# Patient Record
Sex: Female | Born: 1945 | Race: Black or African American | Hispanic: No | Smoking: Never smoker
Health system: Southern US, Community
[De-identification: ages and names within clinical notes are randomized; demographics above are authoritative.]

## PROBLEM LIST (undated history)

## (undated) DIAGNOSIS — Q249 Congenital malformation of heart, unspecified: Secondary | ICD-10-CM

## (undated) DIAGNOSIS — Z950 Presence of cardiac pacemaker: Secondary | ICD-10-CM

## (undated) DIAGNOSIS — M1990 Unspecified osteoarthritis, unspecified site: Secondary | ICD-10-CM

## (undated) DIAGNOSIS — I4811 Longstanding persistent atrial fibrillation: Secondary | ICD-10-CM

## (undated) DIAGNOSIS — I442 Atrioventricular block, complete: Secondary | ICD-10-CM

## (undated) DIAGNOSIS — Z0389 Encounter for observation for other suspected diseases and conditions ruled out: Secondary | ICD-10-CM

## (undated) DIAGNOSIS — R011 Cardiac murmur, unspecified: Secondary | ICD-10-CM

## (undated) DIAGNOSIS — I10 Essential (primary) hypertension: Secondary | ICD-10-CM

## (undated) DIAGNOSIS — E039 Hypothyroidism, unspecified: Secondary | ICD-10-CM

## (undated) DIAGNOSIS — I5032 Chronic diastolic (congestive) heart failure: Secondary | ICD-10-CM

## (undated) DIAGNOSIS — J449 Chronic obstructive pulmonary disease, unspecified: Secondary | ICD-10-CM

## (undated) DIAGNOSIS — J45909 Unspecified asthma, uncomplicated: Secondary | ICD-10-CM

## (undated) DIAGNOSIS — K219 Gastro-esophageal reflux disease without esophagitis: Secondary | ICD-10-CM

## (undated) DIAGNOSIS — IMO0001 Reserved for inherently not codable concepts without codable children: Secondary | ICD-10-CM

## (undated) DIAGNOSIS — I2510 Atherosclerotic heart disease of native coronary artery without angina pectoris: Secondary | ICD-10-CM

## (undated) HISTORY — PX: TUBAL LIGATION: SHX77

## (undated) HISTORY — DX: Atrioventricular block, complete: I44.2

## (undated) HISTORY — PX: OTHER SURGICAL HISTORY: SHX169

## (undated) HISTORY — PX: INSERT / REPLACE / REMOVE PACEMAKER: SUR710

## (undated) HISTORY — DX: Encounter for observation for other suspected diseases and conditions ruled out: Z03.89

---

## 1986-08-08 HISTORY — PX: PACEMAKER INSERTION: SHX728

## 1986-08-08 HISTORY — PX: OTHER SURGICAL HISTORY: SHX169

## 1997-11-24 ENCOUNTER — Ambulatory Visit (HOSPITAL_BASED_OUTPATIENT_CLINIC_OR_DEPARTMENT_OTHER): Admission: RE | Admit: 1997-11-24 | Discharge: 1997-11-24 | Payer: Self-pay | Admitting: Orthopedic Surgery

## 1997-12-24 ENCOUNTER — Encounter: Admission: RE | Admit: 1997-12-24 | Discharge: 1998-03-24 | Payer: Self-pay | Admitting: Orthopedic Surgery

## 1998-01-09 ENCOUNTER — Encounter: Admission: RE | Admit: 1998-01-09 | Discharge: 1998-01-09 | Payer: Self-pay | Admitting: Family Medicine

## 1998-01-21 ENCOUNTER — Encounter: Admission: RE | Admit: 1998-01-21 | Discharge: 1998-01-21 | Payer: Self-pay | Admitting: Family Medicine

## 1998-01-23 ENCOUNTER — Encounter: Admission: RE | Admit: 1998-01-23 | Discharge: 1998-01-23 | Payer: Self-pay | Admitting: Family Medicine

## 1998-01-28 ENCOUNTER — Encounter: Admission: RE | Admit: 1998-01-28 | Discharge: 1998-01-28 | Payer: Self-pay | Admitting: Family Medicine

## 1998-02-18 ENCOUNTER — Other Ambulatory Visit: Admission: RE | Admit: 1998-02-18 | Discharge: 1998-02-18 | Payer: Self-pay | Admitting: *Deleted

## 1998-02-18 ENCOUNTER — Encounter: Admission: RE | Admit: 1998-02-18 | Discharge: 1998-02-18 | Payer: Self-pay | Admitting: Family Medicine

## 1998-06-08 ENCOUNTER — Encounter: Admission: RE | Admit: 1998-06-08 | Discharge: 1998-06-08 | Payer: Self-pay | Admitting: Family Medicine

## 1998-06-09 ENCOUNTER — Encounter: Admission: RE | Admit: 1998-06-09 | Discharge: 1998-06-09 | Payer: Self-pay | Admitting: Sports Medicine

## 1998-08-18 ENCOUNTER — Encounter: Admission: RE | Admit: 1998-08-18 | Discharge: 1998-08-18 | Payer: Self-pay | Admitting: Sports Medicine

## 1998-09-08 ENCOUNTER — Encounter: Admission: RE | Admit: 1998-09-08 | Discharge: 1998-09-08 | Payer: Self-pay | Admitting: Family Medicine

## 1998-09-08 ENCOUNTER — Encounter: Admission: RE | Admit: 1998-09-08 | Discharge: 1998-09-28 | Payer: Self-pay

## 1998-12-01 ENCOUNTER — Encounter: Admission: RE | Admit: 1998-12-01 | Discharge: 1998-12-01 | Payer: Self-pay | Admitting: Sports Medicine

## 1998-12-16 ENCOUNTER — Encounter: Admission: RE | Admit: 1998-12-16 | Discharge: 1998-12-16 | Payer: Self-pay | Admitting: Family Medicine

## 1999-01-28 ENCOUNTER — Encounter: Admission: RE | Admit: 1999-01-28 | Discharge: 1999-01-28 | Payer: Self-pay | Admitting: Family Medicine

## 1999-02-11 ENCOUNTER — Encounter: Admission: RE | Admit: 1999-02-11 | Discharge: 1999-02-11 | Payer: Self-pay | Admitting: Family Medicine

## 1999-03-09 ENCOUNTER — Encounter: Admission: RE | Admit: 1999-03-09 | Discharge: 1999-03-09 | Payer: Self-pay | Admitting: Family Medicine

## 1999-03-31 ENCOUNTER — Encounter: Admission: RE | Admit: 1999-03-31 | Discharge: 1999-03-31 | Payer: Self-pay | Admitting: Family Medicine

## 1999-06-15 ENCOUNTER — Encounter: Payer: Self-pay | Admitting: Sports Medicine

## 1999-06-15 ENCOUNTER — Encounter: Admission: RE | Admit: 1999-06-15 | Discharge: 1999-06-15 | Payer: Self-pay | Admitting: Sports Medicine

## 1999-06-24 ENCOUNTER — Encounter: Admission: RE | Admit: 1999-06-24 | Discharge: 1999-06-24 | Payer: Self-pay | Admitting: Family Medicine

## 1999-07-02 ENCOUNTER — Encounter: Payer: Self-pay | Admitting: Sports Medicine

## 1999-07-15 ENCOUNTER — Encounter: Admission: RE | Admit: 1999-07-15 | Discharge: 1999-07-15 | Payer: Self-pay | Admitting: Family Medicine

## 1999-11-05 ENCOUNTER — Encounter: Admission: RE | Admit: 1999-11-05 | Discharge: 1999-11-05 | Payer: Self-pay | Admitting: Cardiology

## 1999-11-05 ENCOUNTER — Encounter: Payer: Self-pay | Admitting: Cardiology

## 1999-12-17 ENCOUNTER — Encounter: Admission: RE | Admit: 1999-12-17 | Discharge: 1999-12-17 | Payer: Self-pay | Admitting: Family Medicine

## 1999-12-28 ENCOUNTER — Encounter: Admission: RE | Admit: 1999-12-28 | Discharge: 1999-12-28 | Payer: Self-pay | Admitting: Sports Medicine

## 2000-02-02 ENCOUNTER — Encounter: Admission: RE | Admit: 2000-02-02 | Discharge: 2000-02-02 | Payer: Self-pay | Admitting: Family Medicine

## 2000-02-08 ENCOUNTER — Encounter: Payer: Self-pay | Admitting: Family Medicine

## 2000-02-08 ENCOUNTER — Encounter: Admission: RE | Admit: 2000-02-08 | Discharge: 2000-02-08 | Payer: Self-pay | Admitting: Family Medicine

## 2000-02-18 ENCOUNTER — Encounter: Admission: RE | Admit: 2000-02-18 | Discharge: 2000-02-18 | Payer: Self-pay | Admitting: Family Medicine

## 2000-03-01 ENCOUNTER — Ambulatory Visit (HOSPITAL_COMMUNITY): Admission: RE | Admit: 2000-03-01 | Discharge: 2000-03-01 | Payer: Self-pay | Admitting: Orthopedic Surgery

## 2000-03-01 ENCOUNTER — Encounter: Payer: Self-pay | Admitting: Orthopedic Surgery

## 2000-03-16 ENCOUNTER — Ambulatory Visit (HOSPITAL_COMMUNITY): Admission: RE | Admit: 2000-03-16 | Discharge: 2000-03-16 | Payer: Self-pay | Admitting: Family Medicine

## 2000-03-16 ENCOUNTER — Encounter: Admission: RE | Admit: 2000-03-16 | Discharge: 2000-03-16 | Payer: Self-pay | Admitting: Family Medicine

## 2000-04-26 ENCOUNTER — Encounter: Admission: RE | Admit: 2000-04-26 | Discharge: 2000-04-26 | Payer: Self-pay | Admitting: Family Medicine

## 2000-06-14 ENCOUNTER — Encounter: Admission: RE | Admit: 2000-06-14 | Discharge: 2000-06-14 | Payer: Self-pay | Admitting: Family Medicine

## 2000-08-22 ENCOUNTER — Encounter: Admission: RE | Admit: 2000-08-22 | Discharge: 2000-08-22 | Payer: Self-pay | Admitting: Sports Medicine

## 2000-08-25 ENCOUNTER — Encounter: Admission: RE | Admit: 2000-08-25 | Discharge: 2000-08-25 | Payer: Self-pay | Admitting: Family Medicine

## 2000-09-13 ENCOUNTER — Encounter: Admission: RE | Admit: 2000-09-13 | Discharge: 2000-09-13 | Payer: Self-pay | Admitting: Family Medicine

## 2000-09-27 ENCOUNTER — Encounter: Admission: RE | Admit: 2000-09-27 | Discharge: 2000-09-27 | Payer: Self-pay | Admitting: Family Medicine

## 2000-10-27 ENCOUNTER — Encounter: Admission: RE | Admit: 2000-10-27 | Discharge: 2000-10-27 | Payer: Self-pay | Admitting: Family Medicine

## 2000-11-16 ENCOUNTER — Encounter: Admission: RE | Admit: 2000-11-16 | Discharge: 2000-11-16 | Payer: Self-pay | Admitting: Family Medicine

## 2001-01-25 ENCOUNTER — Encounter: Admission: RE | Admit: 2001-01-25 | Discharge: 2001-01-25 | Payer: Self-pay | Admitting: Family Medicine

## 2001-04-27 ENCOUNTER — Encounter: Admission: RE | Admit: 2001-04-27 | Discharge: 2001-04-27 | Payer: Self-pay | Admitting: Family Medicine

## 2001-05-08 ENCOUNTER — Encounter: Admission: RE | Admit: 2001-05-08 | Discharge: 2001-05-08 | Payer: Self-pay | Admitting: Family Medicine

## 2001-06-05 ENCOUNTER — Encounter: Admission: RE | Admit: 2001-06-05 | Discharge: 2001-06-05 | Payer: Self-pay | Admitting: Family Medicine

## 2001-06-06 ENCOUNTER — Encounter: Admission: RE | Admit: 2001-06-06 | Discharge: 2001-06-06 | Payer: Self-pay | Admitting: Sports Medicine

## 2001-06-06 ENCOUNTER — Encounter: Payer: Self-pay | Admitting: Sports Medicine

## 2001-09-10 ENCOUNTER — Encounter: Admission: RE | Admit: 2001-09-10 | Discharge: 2001-09-10 | Payer: Self-pay | Admitting: Family Medicine

## 2001-10-01 ENCOUNTER — Encounter: Admission: RE | Admit: 2001-10-01 | Discharge: 2001-10-01 | Payer: Self-pay | Admitting: Family Medicine

## 2001-10-05 ENCOUNTER — Encounter: Admission: RE | Admit: 2001-10-05 | Discharge: 2001-10-05 | Payer: Self-pay | Admitting: Sports Medicine

## 2001-10-05 ENCOUNTER — Encounter: Payer: Self-pay | Admitting: Sports Medicine

## 2001-10-17 ENCOUNTER — Encounter: Admission: RE | Admit: 2001-10-17 | Discharge: 2001-10-17 | Payer: Self-pay | Admitting: Family Medicine

## 2001-11-01 ENCOUNTER — Encounter: Admission: RE | Admit: 2001-11-01 | Discharge: 2002-01-30 | Payer: Self-pay | Admitting: Sports Medicine

## 2001-12-03 ENCOUNTER — Encounter: Admission: RE | Admit: 2001-12-03 | Discharge: 2001-12-03 | Payer: Self-pay | Admitting: Family Medicine

## 2001-12-17 ENCOUNTER — Encounter: Admission: RE | Admit: 2001-12-17 | Discharge: 2001-12-17 | Payer: Self-pay | Admitting: Family Medicine

## 2002-01-08 ENCOUNTER — Encounter: Admission: RE | Admit: 2002-01-08 | Discharge: 2002-01-08 | Payer: Self-pay | Admitting: Family Medicine

## 2002-01-29 ENCOUNTER — Encounter: Admission: RE | Admit: 2002-01-29 | Discharge: 2002-01-29 | Payer: Self-pay | Admitting: Family Medicine

## 2002-03-12 ENCOUNTER — Encounter: Admission: RE | Admit: 2002-03-12 | Discharge: 2002-03-12 | Payer: Self-pay | Admitting: Sports Medicine

## 2002-03-27 ENCOUNTER — Ambulatory Visit (HOSPITAL_COMMUNITY): Admission: RE | Admit: 2002-03-27 | Discharge: 2002-03-27 | Payer: Self-pay | Admitting: Cardiology

## 2002-03-27 ENCOUNTER — Encounter: Payer: Self-pay | Admitting: Cardiology

## 2002-06-03 ENCOUNTER — Encounter: Admission: RE | Admit: 2002-06-03 | Discharge: 2002-06-03 | Payer: Self-pay | Admitting: Family Medicine

## 2002-07-19 ENCOUNTER — Encounter: Admission: RE | Admit: 2002-07-19 | Discharge: 2002-07-19 | Payer: Self-pay | Admitting: Family Medicine

## 2002-08-20 ENCOUNTER — Encounter: Admission: RE | Admit: 2002-08-20 | Discharge: 2002-08-20 | Payer: Self-pay | Admitting: Sports Medicine

## 2002-08-30 ENCOUNTER — Encounter: Admission: RE | Admit: 2002-08-30 | Discharge: 2002-08-30 | Payer: Self-pay | Admitting: Family Medicine

## 2002-09-30 ENCOUNTER — Encounter: Admission: RE | Admit: 2002-09-30 | Discharge: 2002-09-30 | Payer: Self-pay | Admitting: Family Medicine

## 2002-10-01 ENCOUNTER — Encounter: Admission: RE | Admit: 2002-10-01 | Discharge: 2002-10-01 | Payer: Self-pay | Admitting: Family Medicine

## 2002-10-10 ENCOUNTER — Encounter: Admission: RE | Admit: 2002-10-10 | Discharge: 2002-10-10 | Payer: Self-pay | Admitting: Family Medicine

## 2002-10-31 ENCOUNTER — Encounter: Admission: RE | Admit: 2002-10-31 | Discharge: 2002-10-31 | Payer: Self-pay | Admitting: Family Medicine

## 2003-02-17 ENCOUNTER — Encounter: Admission: RE | Admit: 2003-02-17 | Discharge: 2003-02-17 | Payer: Self-pay | Admitting: Family Medicine

## 2003-02-25 ENCOUNTER — Encounter: Payer: Self-pay | Admitting: Sports Medicine

## 2003-02-25 ENCOUNTER — Encounter: Admission: RE | Admit: 2003-02-25 | Discharge: 2003-02-25 | Payer: Self-pay | Admitting: Sports Medicine

## 2003-03-11 ENCOUNTER — Encounter: Admission: RE | Admit: 2003-03-11 | Discharge: 2003-06-09 | Payer: Self-pay | Admitting: Family Medicine

## 2003-06-20 ENCOUNTER — Encounter: Admission: RE | Admit: 2003-06-20 | Discharge: 2003-06-20 | Payer: Self-pay | Admitting: Family Medicine

## 2003-06-23 ENCOUNTER — Encounter: Admission: RE | Admit: 2003-06-23 | Discharge: 2003-06-23 | Payer: Self-pay | Admitting: Family Medicine

## 2003-12-24 ENCOUNTER — Encounter: Admission: RE | Admit: 2003-12-24 | Discharge: 2003-12-24 | Payer: Self-pay | Admitting: Sports Medicine

## 2003-12-24 ENCOUNTER — Other Ambulatory Visit: Admission: RE | Admit: 2003-12-24 | Discharge: 2003-12-24 | Payer: Self-pay | Admitting: Family Medicine

## 2004-06-15 ENCOUNTER — Ambulatory Visit: Payer: Self-pay | Admitting: Family Medicine

## 2004-09-21 ENCOUNTER — Ambulatory Visit: Payer: Self-pay | Admitting: Family Medicine

## 2004-12-11 ENCOUNTER — Emergency Department (HOSPITAL_COMMUNITY): Admission: EM | Admit: 2004-12-11 | Discharge: 2004-12-11 | Payer: Self-pay | Admitting: Family Medicine

## 2005-01-21 ENCOUNTER — Ambulatory Visit: Payer: Self-pay | Admitting: Family Medicine

## 2005-02-27 ENCOUNTER — Emergency Department (HOSPITAL_COMMUNITY): Admission: EM | Admit: 2005-02-27 | Discharge: 2005-02-27 | Payer: Self-pay | Admitting: Family Medicine

## 2005-03-18 ENCOUNTER — Emergency Department (HOSPITAL_COMMUNITY): Admission: EM | Admit: 2005-03-18 | Discharge: 2005-03-18 | Payer: Self-pay | Admitting: Family Medicine

## 2005-08-09 ENCOUNTER — Ambulatory Visit: Payer: Self-pay | Admitting: Sports Medicine

## 2005-08-14 ENCOUNTER — Encounter (INDEPENDENT_AMBULATORY_CARE_PROVIDER_SITE_OTHER): Payer: Self-pay | Admitting: *Deleted

## 2005-08-24 ENCOUNTER — Ambulatory Visit (HOSPITAL_COMMUNITY): Admission: RE | Admit: 2005-08-24 | Discharge: 2005-08-24 | Payer: Self-pay | Admitting: Family Medicine

## 2005-08-29 ENCOUNTER — Encounter: Admission: RE | Admit: 2005-08-29 | Discharge: 2005-08-29 | Payer: Self-pay | Admitting: Sports Medicine

## 2005-09-08 ENCOUNTER — Ambulatory Visit (HOSPITAL_COMMUNITY): Admission: RE | Admit: 2005-09-08 | Discharge: 2005-09-08 | Payer: Self-pay | Admitting: Gastroenterology

## 2005-09-08 ENCOUNTER — Encounter (INDEPENDENT_AMBULATORY_CARE_PROVIDER_SITE_OTHER): Payer: Self-pay | Admitting: *Deleted

## 2005-10-31 ENCOUNTER — Ambulatory Visit: Payer: Self-pay | Admitting: Family Medicine

## 2005-11-04 ENCOUNTER — Ambulatory Visit: Payer: Self-pay | Admitting: Sports Medicine

## 2005-12-02 ENCOUNTER — Ambulatory Visit: Payer: Self-pay | Admitting: Family Medicine

## 2006-01-03 ENCOUNTER — Ambulatory Visit: Payer: Self-pay | Admitting: Family Medicine

## 2006-02-22 ENCOUNTER — Ambulatory Visit: Payer: Self-pay | Admitting: Family Medicine

## 2006-02-22 ENCOUNTER — Encounter: Admission: RE | Admit: 2006-02-22 | Discharge: 2006-02-22 | Payer: Self-pay | Admitting: Sports Medicine

## 2006-05-29 ENCOUNTER — Ambulatory Visit: Payer: Self-pay | Admitting: Sports Medicine

## 2006-06-21 ENCOUNTER — Emergency Department (HOSPITAL_COMMUNITY): Admission: EM | Admit: 2006-06-21 | Discharge: 2006-06-21 | Payer: Self-pay | Admitting: Emergency Medicine

## 2006-08-10 ENCOUNTER — Encounter (INDEPENDENT_AMBULATORY_CARE_PROVIDER_SITE_OTHER): Payer: Self-pay | Admitting: Family Medicine

## 2006-08-10 ENCOUNTER — Ambulatory Visit: Payer: Self-pay | Admitting: Family Medicine

## 2006-08-10 LAB — CONVERTED CEMR LAB
Calcium: 10.5 mg/dL (ref 8.4–10.5)
Chloride: 101 meq/L (ref 96–112)
Glucose, Bld: 113 mg/dL — ABNORMAL HIGH (ref 70–99)
Potassium: 3.9 meq/L (ref 3.5–5.3)

## 2006-08-30 ENCOUNTER — Ambulatory Visit: Payer: Self-pay | Admitting: Family Medicine

## 2006-09-23 ENCOUNTER — Emergency Department (HOSPITAL_COMMUNITY): Admission: EM | Admit: 2006-09-23 | Discharge: 2006-09-23 | Payer: Self-pay | Admitting: Family Medicine

## 2006-10-06 ENCOUNTER — Encounter (INDEPENDENT_AMBULATORY_CARE_PROVIDER_SITE_OTHER): Payer: Self-pay | Admitting: *Deleted

## 2006-10-10 ENCOUNTER — Encounter: Admission: RE | Admit: 2006-10-10 | Discharge: 2006-10-10 | Payer: Self-pay | Admitting: Family Medicine

## 2006-10-18 ENCOUNTER — Encounter: Admission: RE | Admit: 2006-10-18 | Discharge: 2006-10-18 | Payer: Self-pay | Admitting: Sports Medicine

## 2006-10-23 ENCOUNTER — Telehealth: Payer: Self-pay | Admitting: *Deleted

## 2006-11-03 ENCOUNTER — Ambulatory Visit: Payer: Self-pay | Admitting: Family Medicine

## 2006-11-03 ENCOUNTER — Encounter (INDEPENDENT_AMBULATORY_CARE_PROVIDER_SITE_OTHER): Payer: Self-pay | Admitting: Family Medicine

## 2006-11-03 DIAGNOSIS — E785 Hyperlipidemia, unspecified: Secondary | ICD-10-CM | POA: Insufficient documentation

## 2006-11-03 LAB — CONVERTED CEMR LAB
Cholesterol: 158 mg/dL (ref 0–200)
HDL: 43 mg/dL (ref >39)
Total CHOL/HDL Ratio: 3.7
Triglycerides: 123 mg/dL (ref <150)
VLDL: 25 mg/dL (ref 0–40)

## 2006-11-06 ENCOUNTER — Encounter (INDEPENDENT_AMBULATORY_CARE_PROVIDER_SITE_OTHER): Payer: Self-pay | Admitting: Family Medicine

## 2006-11-06 ENCOUNTER — Ambulatory Visit: Payer: Self-pay | Admitting: Sports Medicine

## 2006-11-06 DIAGNOSIS — F339 Major depressive disorder, recurrent, unspecified: Secondary | ICD-10-CM | POA: Insufficient documentation

## 2006-11-06 DIAGNOSIS — F329 Major depressive disorder, single episode, unspecified: Secondary | ICD-10-CM

## 2006-11-06 DIAGNOSIS — E119 Type 2 diabetes mellitus without complications: Secondary | ICD-10-CM

## 2006-11-06 LAB — CONVERTED CEMR LAB
BUN: 12 mg/dL (ref 6–23)
CO2: 30 meq/L (ref 19–32)
Calcium: 10.3 mg/dL (ref 8.4–10.5)
Cholesterol, target level: 200 mg/dL
Creatinine, Ser: 0.79 mg/dL (ref 0.40–1.20)
Glucose, Bld: 76 mg/dL (ref 70–99)
Hgb A1c MFr Bld: 7 %
Sodium: 143 meq/L (ref 135–145)

## 2006-11-09 ENCOUNTER — Encounter (INDEPENDENT_AMBULATORY_CARE_PROVIDER_SITE_OTHER): Payer: Self-pay | Admitting: Family Medicine

## 2006-11-13 ENCOUNTER — Telehealth: Payer: Self-pay | Admitting: *Deleted

## 2006-12-27 ENCOUNTER — Telehealth: Payer: Self-pay | Admitting: *Deleted

## 2007-01-26 ENCOUNTER — Telehealth: Payer: Self-pay | Admitting: *Deleted

## 2007-02-19 ENCOUNTER — Encounter (INDEPENDENT_AMBULATORY_CARE_PROVIDER_SITE_OTHER): Payer: Self-pay | Admitting: Family Medicine

## 2007-02-19 ENCOUNTER — Ambulatory Visit: Payer: Self-pay | Admitting: Family Medicine

## 2007-02-19 LAB — CONVERTED CEMR LAB
ALT: 18 units/L (ref 0–35)
CO2: 27 meq/L (ref 19–32)
Calcium: 9.7 mg/dL (ref 8.4–10.5)
Chloride: 101 meq/L (ref 96–112)
Creatinine, Ser: 0.81 mg/dL (ref 0.40–1.20)
Glucose, Bld: 65 mg/dL — ABNORMAL LOW (ref 70–99)
Magnesium: 1.8 mg/dL (ref 1.5–2.5)
Phosphorus: 3.5 mg/dL (ref 2.3–4.6)
Total Bilirubin: 0.4 mg/dL (ref 0.3–1.2)
Total Protein: 7.8 g/dL (ref 6.0–8.3)

## 2007-02-20 ENCOUNTER — Encounter (INDEPENDENT_AMBULATORY_CARE_PROVIDER_SITE_OTHER): Payer: Self-pay | Admitting: Family Medicine

## 2007-02-21 ENCOUNTER — Encounter (INDEPENDENT_AMBULATORY_CARE_PROVIDER_SITE_OTHER): Payer: Self-pay | Admitting: Family Medicine

## 2007-03-05 ENCOUNTER — Encounter (INDEPENDENT_AMBULATORY_CARE_PROVIDER_SITE_OTHER): Payer: Self-pay | Admitting: Family Medicine

## 2007-03-05 ENCOUNTER — Ambulatory Visit: Payer: Self-pay | Admitting: Family Medicine

## 2007-03-05 LAB — CONVERTED CEMR LAB: Total CK: 154 units/L (ref 7–177)

## 2007-03-13 ENCOUNTER — Encounter: Admission: RE | Admit: 2007-03-13 | Discharge: 2007-03-13 | Payer: Self-pay | Admitting: Family Medicine

## 2007-03-21 ENCOUNTER — Telehealth: Payer: Self-pay | Admitting: *Deleted

## 2007-04-04 ENCOUNTER — Ambulatory Visit: Payer: Self-pay | Admitting: Family Medicine

## 2007-05-05 ENCOUNTER — Emergency Department (HOSPITAL_COMMUNITY): Admission: EM | Admit: 2007-05-05 | Discharge: 2007-05-05 | Payer: Self-pay | Admitting: Emergency Medicine

## 2007-06-01 ENCOUNTER — Encounter (INDEPENDENT_AMBULATORY_CARE_PROVIDER_SITE_OTHER): Payer: Self-pay | Admitting: Family Medicine

## 2007-06-01 ENCOUNTER — Ambulatory Visit: Payer: Self-pay | Admitting: Family Medicine

## 2007-06-01 LAB — CONVERTED CEMR LAB
CO2: 30 meq/L (ref 19–32)
Calcium: 9.9 mg/dL (ref 8.4–10.5)
Chloride: 101 meq/L (ref 96–112)
Glucose, Bld: 98 mg/dL (ref 70–99)
Hemoglobin: 13 g/dL (ref 12.0–15.0)
MCV: 87.3 fL (ref 78.0–100.0)
Potassium: 3.7 meq/L (ref 3.5–5.3)
Sodium: 143 meq/L (ref 135–145)

## 2007-06-08 ENCOUNTER — Telehealth: Payer: Self-pay | Admitting: *Deleted

## 2007-06-14 ENCOUNTER — Encounter (INDEPENDENT_AMBULATORY_CARE_PROVIDER_SITE_OTHER): Payer: Self-pay | Admitting: Family Medicine

## 2007-06-14 ENCOUNTER — Ambulatory Visit: Payer: Self-pay | Admitting: Family Medicine

## 2007-06-14 DIAGNOSIS — I10 Essential (primary) hypertension: Secondary | ICD-10-CM

## 2007-06-18 ENCOUNTER — Encounter: Admission: RE | Admit: 2007-06-18 | Discharge: 2007-06-18 | Payer: Self-pay | Admitting: Cardiology

## 2007-06-22 ENCOUNTER — Inpatient Hospital Stay (HOSPITAL_COMMUNITY): Admission: RE | Admit: 2007-06-22 | Discharge: 2007-06-23 | Payer: Self-pay | Admitting: *Deleted

## 2007-07-13 ENCOUNTER — Telehealth: Payer: Self-pay | Admitting: *Deleted

## 2007-07-16 ENCOUNTER — Telehealth (INDEPENDENT_AMBULATORY_CARE_PROVIDER_SITE_OTHER): Payer: Self-pay | Admitting: Family Medicine

## 2007-07-27 ENCOUNTER — Encounter (INDEPENDENT_AMBULATORY_CARE_PROVIDER_SITE_OTHER): Payer: Self-pay | Admitting: Family Medicine

## 2007-09-03 ENCOUNTER — Encounter (INDEPENDENT_AMBULATORY_CARE_PROVIDER_SITE_OTHER): Payer: Self-pay | Admitting: Family Medicine

## 2007-09-19 ENCOUNTER — Ambulatory Visit: Payer: Self-pay | Admitting: Family Medicine

## 2007-10-15 ENCOUNTER — Telehealth: Payer: Self-pay | Admitting: *Deleted

## 2007-10-23 ENCOUNTER — Ambulatory Visit: Payer: Self-pay | Admitting: Family Medicine

## 2007-11-01 ENCOUNTER — Encounter: Admission: RE | Admit: 2007-11-01 | Discharge: 2007-11-01 | Payer: Self-pay | Admitting: Family Medicine

## 2007-11-11 ENCOUNTER — Encounter: Payer: Self-pay | Admitting: *Deleted

## 2007-11-12 ENCOUNTER — Telehealth: Payer: Self-pay | Admitting: *Deleted

## 2007-11-26 ENCOUNTER — Ambulatory Visit: Payer: Self-pay | Admitting: Family Medicine

## 2007-11-26 ENCOUNTER — Encounter (INDEPENDENT_AMBULATORY_CARE_PROVIDER_SITE_OTHER): Payer: Self-pay | Admitting: Family Medicine

## 2007-11-26 ENCOUNTER — Telehealth: Payer: Self-pay | Admitting: *Deleted

## 2007-11-26 LAB — CONVERTED CEMR LAB
HCT: 38.7 % (ref 36.0–46.0)
Hemoglobin: 12.4 g/dL (ref 12.0–15.0)
MCHC: 32 g/dL (ref 30.0–36.0)
MCV: 86.6 fL (ref 78.0–100.0)
Pap Smear: NORMAL
Platelets: 251 10*3/uL (ref 150–400)
RBC: 4.47 M/uL (ref 3.87–5.11)
RDW: 13.3 % (ref 11.5–15.5)
WBC: 7.9 10*3/uL (ref 4.0–10.5)

## 2007-12-03 ENCOUNTER — Encounter (INDEPENDENT_AMBULATORY_CARE_PROVIDER_SITE_OTHER): Payer: Self-pay | Admitting: Family Medicine

## 2007-12-04 ENCOUNTER — Telehealth: Payer: Self-pay | Admitting: *Deleted

## 2008-01-30 ENCOUNTER — Encounter (INDEPENDENT_AMBULATORY_CARE_PROVIDER_SITE_OTHER): Payer: Self-pay | Admitting: Family Medicine

## 2008-02-18 ENCOUNTER — Telehealth: Payer: Self-pay | Admitting: *Deleted

## 2008-02-19 ENCOUNTER — Encounter: Payer: Self-pay | Admitting: Family Medicine

## 2008-04-22 ENCOUNTER — Telehealth: Payer: Self-pay | Admitting: *Deleted

## 2008-04-22 ENCOUNTER — Encounter: Payer: Self-pay | Admitting: Family Medicine

## 2008-05-19 ENCOUNTER — Encounter: Admission: RE | Admit: 2008-05-19 | Discharge: 2008-05-19 | Payer: Self-pay | Admitting: Orthopedic Surgery

## 2008-05-28 ENCOUNTER — Ambulatory Visit: Payer: Self-pay | Admitting: Family Medicine

## 2008-06-27 ENCOUNTER — Encounter: Payer: Self-pay | Admitting: Family Medicine

## 2008-08-05 ENCOUNTER — Encounter: Payer: Self-pay | Admitting: Family Medicine

## 2008-08-28 ENCOUNTER — Encounter: Payer: Self-pay | Admitting: Family Medicine

## 2008-09-01 ENCOUNTER — Telehealth: Payer: Self-pay | Admitting: *Deleted

## 2008-09-03 ENCOUNTER — Ambulatory Visit: Payer: Self-pay | Admitting: Family Medicine

## 2008-09-03 ENCOUNTER — Encounter: Payer: Self-pay | Admitting: Family Medicine

## 2008-09-03 DIAGNOSIS — R259 Unspecified abnormal involuntary movements: Secondary | ICD-10-CM

## 2008-09-03 HISTORY — DX: Unspecified abnormal involuntary movements: R25.9

## 2008-09-03 LAB — CONVERTED CEMR LAB: Hgb A1c MFr Bld: 7.7 %

## 2008-09-04 ENCOUNTER — Encounter: Payer: Self-pay | Admitting: Family Medicine

## 2008-09-04 LAB — CONVERTED CEMR LAB
AST: 17 units/L (ref 0–37)
Albumin: 4.5 g/dL (ref 3.5–5.2)
Alkaline Phosphatase: 49 units/L (ref 39–117)
BUN: 17 mg/dL (ref 6–23)
Calcium: 9.9 mg/dL (ref 8.4–10.5)
Chloride: 99 meq/L (ref 96–112)
HDL: 39 mg/dL — ABNORMAL LOW (ref >39)
Hemoglobin: 13.2 g/dL (ref 12.0–15.0)
LDL Cholesterol: 98 mg/dL (ref 0–99)
MCHC: 32 g/dL (ref 30.0–36.0)
Potassium: 4.3 meq/L (ref 3.5–5.3)
RBC: 4.71 M/uL (ref 3.87–5.11)
RDW: 12.4 % (ref 11.5–15.5)
Sodium: 141 meq/L (ref 135–145)
TSH: 3.823 microintl units/mL (ref 0.350–4.50)
Total Protein: 7.7 g/dL (ref 6.0–8.3)

## 2008-09-23 ENCOUNTER — Encounter: Payer: Self-pay | Admitting: Family Medicine

## 2008-09-30 ENCOUNTER — Ambulatory Visit (HOSPITAL_COMMUNITY): Admission: RE | Admit: 2008-09-30 | Discharge: 2008-10-01 | Payer: Self-pay | Admitting: Orthopedic Surgery

## 2008-10-20 ENCOUNTER — Encounter: Admission: RE | Admit: 2008-10-20 | Discharge: 2008-12-24 | Payer: Self-pay | Admitting: Orthopedic Surgery

## 2008-10-23 ENCOUNTER — Telehealth: Payer: Self-pay | Admitting: *Deleted

## 2008-11-06 ENCOUNTER — Encounter: Payer: Self-pay | Admitting: Family Medicine

## 2008-11-06 DIAGNOSIS — J449 Chronic obstructive pulmonary disease, unspecified: Secondary | ICD-10-CM | POA: Insufficient documentation

## 2009-01-19 ENCOUNTER — Ambulatory Visit: Payer: Self-pay | Admitting: Family Medicine

## 2009-01-19 DIAGNOSIS — K219 Gastro-esophageal reflux disease without esophagitis: Secondary | ICD-10-CM

## 2009-01-20 ENCOUNTER — Encounter: Payer: Self-pay | Admitting: Family Medicine

## 2009-01-21 ENCOUNTER — Encounter: Payer: Self-pay | Admitting: Family Medicine

## 2009-01-30 ENCOUNTER — Telehealth: Payer: Self-pay | Admitting: *Deleted

## 2009-02-10 ENCOUNTER — Encounter: Payer: Self-pay | Admitting: Family Medicine

## 2009-02-24 ENCOUNTER — Encounter: Admission: RE | Admit: 2009-02-24 | Discharge: 2009-02-24 | Payer: Self-pay | Admitting: Orthopedic Surgery

## 2009-03-26 ENCOUNTER — Encounter: Admission: RE | Admit: 2009-03-26 | Discharge: 2009-03-26 | Payer: Self-pay | Admitting: Orthopedic Surgery

## 2009-05-05 ENCOUNTER — Encounter: Admission: RE | Admit: 2009-05-05 | Discharge: 2009-05-05 | Payer: Self-pay | Admitting: Orthopedic Surgery

## 2009-07-07 ENCOUNTER — Ambulatory Visit: Payer: Self-pay | Admitting: Family Medicine

## 2009-08-08 DIAGNOSIS — IMO0001 Reserved for inherently not codable concepts without codable children: Secondary | ICD-10-CM

## 2009-08-08 HISTORY — DX: Reserved for inherently not codable concepts without codable children: IMO0001

## 2009-08-08 HISTORY — PX: CARDIAC CATHETERIZATION: SHX172

## 2009-09-04 ENCOUNTER — Encounter: Admission: RE | Admit: 2009-09-04 | Discharge: 2009-09-04 | Payer: Self-pay | Admitting: Orthopedic Surgery

## 2009-09-18 ENCOUNTER — Encounter: Admission: RE | Admit: 2009-09-18 | Discharge: 2009-09-18 | Payer: Self-pay | Admitting: Cardiology

## 2009-09-18 ENCOUNTER — Encounter: Payer: Self-pay | Admitting: Family Medicine

## 2009-09-22 ENCOUNTER — Ambulatory Visit (HOSPITAL_COMMUNITY): Admission: RE | Admit: 2009-09-22 | Discharge: 2009-09-22 | Payer: Self-pay | Admitting: Cardiology

## 2009-09-24 ENCOUNTER — Encounter: Payer: Self-pay | Admitting: Family Medicine

## 2009-11-17 ENCOUNTER — Encounter: Payer: Self-pay | Admitting: Family Medicine

## 2009-11-20 ENCOUNTER — Encounter: Payer: Self-pay | Admitting: Family Medicine

## 2009-12-25 ENCOUNTER — Ambulatory Visit: Payer: Self-pay | Admitting: Family Medicine

## 2009-12-25 DIAGNOSIS — H401132 Primary open-angle glaucoma, bilateral, moderate stage: Secondary | ICD-10-CM | POA: Insufficient documentation

## 2009-12-25 DIAGNOSIS — H409 Unspecified glaucoma: Secondary | ICD-10-CM | POA: Insufficient documentation

## 2009-12-25 LAB — CONVERTED CEMR LAB: Hgb A1c MFr Bld: 9.8 %

## 2009-12-28 ENCOUNTER — Ambulatory Visit: Payer: Self-pay | Admitting: Family Medicine

## 2009-12-28 ENCOUNTER — Encounter: Payer: Self-pay | Admitting: Family Medicine

## 2009-12-28 DIAGNOSIS — E039 Hypothyroidism, unspecified: Secondary | ICD-10-CM | POA: Insufficient documentation

## 2009-12-29 ENCOUNTER — Encounter: Payer: Self-pay | Admitting: Family Medicine

## 2009-12-30 LAB — CONVERTED CEMR LAB: Free T4: 1.09 ng/dL (ref 0.80–1.80)

## 2010-01-05 ENCOUNTER — Ambulatory Visit: Payer: Self-pay | Admitting: Family Medicine

## 2010-01-05 DIAGNOSIS — M76899 Other specified enthesopathies of unspecified lower limb, excluding foot: Secondary | ICD-10-CM

## 2010-01-05 LAB — CONVERTED CEMR LAB
ALT: 22 units/L (ref 0–35)
Albumin: 4.1 g/dL (ref 3.5–5.2)
CO2: 28 meq/L (ref 19–32)
Chloride: 101 meq/L (ref 96–112)
Cholesterol: 201 mg/dL — ABNORMAL HIGH (ref 0–200)
Glucose, Bld: 218 mg/dL — ABNORMAL HIGH (ref 70–99)
Potassium: 4 meq/L (ref 3.5–5.3)
RBC: 5.02 M/uL (ref 3.87–5.11)
Sodium: 141 meq/L (ref 135–145)
Total Protein: 6.9 g/dL (ref 6.0–8.3)
Triglycerides: 218 mg/dL — ABNORMAL HIGH (ref <150)
WBC: 4.3 10*3/uL (ref 4.0–10.5)

## 2010-02-03 ENCOUNTER — Encounter: Payer: Self-pay | Admitting: Family Medicine

## 2010-03-05 ENCOUNTER — Telehealth: Payer: Self-pay | Admitting: *Deleted

## 2010-03-31 ENCOUNTER — Encounter: Payer: Self-pay | Admitting: Family Medicine

## 2010-04-01 ENCOUNTER — Encounter: Payer: Self-pay | Admitting: Family Medicine

## 2010-04-19 ENCOUNTER — Encounter: Payer: Self-pay | Admitting: Family Medicine

## 2010-06-05 ENCOUNTER — Emergency Department (HOSPITAL_COMMUNITY): Admission: EM | Admit: 2010-06-05 | Discharge: 2010-06-05 | Payer: Self-pay | Admitting: Family Medicine

## 2010-07-15 ENCOUNTER — Encounter: Payer: Self-pay | Admitting: Family Medicine

## 2010-07-15 ENCOUNTER — Telehealth: Payer: Self-pay | Admitting: Family Medicine

## 2010-07-15 ENCOUNTER — Encounter
Admission: RE | Admit: 2010-07-15 | Discharge: 2010-07-15 | Payer: Self-pay | Source: Home / Self Care | Admitting: Family Medicine

## 2010-07-15 ENCOUNTER — Ambulatory Visit: Payer: Self-pay | Admitting: Family Medicine

## 2010-07-15 DIAGNOSIS — M25539 Pain in unspecified wrist: Secondary | ICD-10-CM | POA: Insufficient documentation

## 2010-07-15 LAB — CONVERTED CEMR LAB
ALT: 22 units/L (ref 0–35)
AST: 19 units/L (ref 0–37)
CO2: 31 meq/L (ref 19–32)
Chloride: 100 meq/L (ref 96–112)
Hgb A1c MFr Bld: 8 %
Sodium: 142 meq/L (ref 135–145)
TSH: 4.976 microintl units/mL — ABNORMAL HIGH (ref 0.350–4.500)
Total Bilirubin: 0.4 mg/dL (ref 0.3–1.2)
Total Protein: 7.1 g/dL (ref 6.0–8.3)

## 2010-07-21 ENCOUNTER — Telehealth: Payer: Self-pay | Admitting: Family Medicine

## 2010-08-25 ENCOUNTER — Encounter
Admission: RE | Admit: 2010-08-25 | Discharge: 2010-08-25 | Payer: Self-pay | Source: Home / Self Care | Attending: Orthopedic Surgery | Admitting: Orthopedic Surgery

## 2010-09-07 NOTE — Assessment & Plan Note (Signed)
Summary: meet new MD/eo   Vital Signs:  Patient profile:   65 year old female Height:      62.75 inches Weight:      155.6 pounds BMI:     27.88 Pulse rate:   72 / minute BP sitting:   128 / 80  (right arm)  Vitals Entered By: Arlyss Repress CMA, (July 15, 2010 8:36 AM) CC: meet new doctor. 1.) f/up DM and HTN 2.) flu shot 3.) check left hand. c/o pain with lifting things Is Patient Diabetic? Yes Pain Assessment Patient in pain? no        Primary Care Provider:  Bobby Rumpf  MD  CC:  meet new doctor. 1.) f/up DM and HTN 2.) flu shot 3.) check left hand. c/o pain with lifting things.  History of Present Illness: 1) DM2: A1C 9.13 Dec 2009. Has had better control in the past. Reports that she is trying to do better with following her diet. Walks for exercise every day. On medications without side effects or hypoglycemic symptoms. Reports CBGs fasting range from 98 to 165 most days unless she strays too far from her dietary recommendations  2) HTN: BP at goal. Taking medications as below without side effects. Reports that she is exercising as above. Denies chest pain, LE edema.   3) Left wrist pain: x 6 months. Tender to palpation at entire wrist especially dorsal. Denies numbness / tingling but reports inability to lift heavy items in her left hand for extended periods secondary to pain. Reports morning wrist and finger stiffness in her left hand. Patient is right handed. Denies joint swelling, redness, preceding trauma.   4) Prevention: Due for mammogram. Due for flu shot.   Habits & Providers  Alcohol-Tobacco-Diet     Tobacco Status: never  Current Medications (verified): 1)  Albuterol 90 Mcg/act Aers (Albuterol) .... Inhale 2 Puff Using Inhaler Every Four Hours 2)  Flovent Hfa 110 Mcg/act Aero (Fluticasone Propionate  Hfa) .... Inhale 2 Puff Using Inhaler Twice A Day 3)  Zolpidem Tartrate 10 Mg Tabs (Zolpidem Tartrate) .... Take 1 Tablet By Mouth At Bedtime. 4)  Aspirin  Ec 81 Mg Tbec (Aspirin) .... Take 1 Tablet By Mouth Once A Day 5)  Cozaar 50 Mg Tabs (Losartan Potassium) .... Take 1 Tablet By Mouth Once A Day 6)  Furosemide 20 Mg Tabs (Furosemide) .... One Tab By Mouth Qday 7)  Glyburide-Metformin 5-500 Mg Tabs (Glyburide-Metformin) .... 2 By Mouth Two Times A Day For Diabetes.  Replaces Glipizide-Metformin Combination 8)  Hydrochlorothiazide 12.5 Mg Caps (Hydrochlorothiazide) .... One Tab By Mouth Qday 9)  Omeprazole 40 Mg Cpdr (Omeprazole) .Marland Kitchen.. 1 By Mouth Two Times A Day For Reflux 10)  Sm Calcium/vitamin D 500-200 Mg-Unit Tabs (Calcium Carbonate-Vitamin D) .... Take 2 Tablet By Mouth Once A Day 11)  Fluoxetine Hcl 20 Mg  Tabs (Fluoxetine Hcl) .... One Tab By Mouth Qday 12)  Klor-Con 20 Meq  Pack (Potassium Chloride) .... One Tablet By Mouth Qday 13)  Clonazepam 0.5 Mg Tabs (Clonazepam) .Marland Kitchen.. 1 Tab By Mouth Two Times A Day As Needed Anxiety. 14)  Propranolol Hcl 40 Mg Tabs (Propranolol Hcl) .Marland Kitchen.. 1  By Mouth Two Times A Day For Tremor 15)  Albuterol Sulfate (2.5 Mg/65ml) 0.083% Nebu (Albuterol Sulfate) .... Qid As Needed 16)  Triamcinolone Acetonide 0.5 % Crea (Triamcinolone Acetonide) .... Apply To Itchy Areas Two Times A Day Until 2-3 Days After Healed. Disp 30g 17)  Zostavax 35573 Unt/0.79ml Solr (Zoster  Vaccine Live) .... Bring To [facility] For  [place] To Give To You For Shingles Prevention .disp #1. 18)  Januvia 100 Mg Tabs (Sitagliptin Phosphate) .... Once Daily For Diabetes 19)  Wrist Brace (Left Wrist) .... Left Wrist Brace - Wear As Needed For Pain - May Substitue With Brace Covered By Insurance  Allergies (verified): 1)  ! Naprosyn 2)  ! * Statins 3)  Lotensin (Benazepril Hcl) 4)  Tylox (Oxycodone-Acetaminophen) 5)  Amitriptyline Hcl (Amitriptyline Hcl) 6)  Darvocet-N 100  Physical Exam  General:  obese, pleasant, NAD, vitals reviewed  Lungs:  normal respiratory effort, no intercostal retractions, normal breath sounds, and no wheezes.   Heart:  normal  rate, regular rhythm, and no murmur.   Msk:  - pain with flexion and extension, less so with lateral and medial movement of wrist - tenderness to palpation distal radius especially over styloid process.  - no joint swelling noted   Neurologic:  - sensation intact to light touch bilateral hands  - strength normal in all extremities   Diabetes Management Exam:    Foot Exam (with socks and/or shoes not present):       Sensory-Pinprick/Light touch:          Left medial foot (L-4): normal          Left dorsal foot (L-5): normal          Left lateral foot (S-1): normal          Right medial foot (L-4): normal          Right dorsal foot (L-5): normal          Right lateral foot (S-1): normal       Sensory-Monofilament:          Left foot: normal          Right foot: normal       Inspection:          Left foot: normal          Right foot: normal       Nails:          Left foot: normal          Right foot: normal   Impression & Recommendations:  Problem # 1:  WRIST PAIN, LEFT ([id number]-[id number]) Assessment New Uncertain etiology - will obtain plain films to better evaluate - consider arthritic cause given exam and history. Will try wrist bracing for now, advised on occasional NSAID use as well.  Orders: Radiology other (Radiology Other)  Problem # 2:  HYPERTENSION (ICD-401.9)  Well controlled and at goal. DASH diet reviewed. Follow up three months. Lipids, CMET today.   Her updated medication list for this problem includes:    Cozaar 50 Mg Tabs (Losartan potassium) .Marland Kitchen... Take 1 tablet by mouth once a day    Furosemide 20 Mg Tabs (Furosemide) ..... One tab by mouth qday    Hydrochlorothiazide 12.5 Mg Caps (Hydrochlorothiazide) ..... One tab by mouth qday    Propranolol Hcl 40 Mg Tabs (Propranolol hcl) .Marland Kitchen... 1  by mouth two times a day for tremor  BP today: 128/80 Prior BP: 92/60 (01/05/2010)  Prior 10 Yr Risk Heart Disease: 11 % (06/01/2007)  Labs Reviewed: K+: 4.0  (12/28/2009) Creat: : 0.75 (12/28/2009)   Chol: 201 (12/28/2009)   HDL: 37 (12/28/2009)   LDL: 120 (12/28/2009)   TG: 218 (12/28/2009)  Problem # 3:  DM (ICD-250.00) A1C improved from last check. Advised to continue diet and exercise changes.  Follow in three months. Normal diabetic foot exam.  Her updated medication list for this problem includes:    Aspirin Ec 81 Mg Tbec (Aspirin) .Marland Kitchen... Take 1 tablet by mouth once a day    Cozaar 50 Mg Tabs (Losartan potassium) .Marland Kitchen... Take 1 tablet by mouth once a day    Glyburide-metformin 5-500 Mg Tabs (Glyburide-metformin) .Marland Kitchen... 2 by mouth two times a day for diabetes.  replaces glipizide-metformin combination    Januvia 100 Mg Tabs (Sitagliptin phosphate) ..... Once daily for diabetes  Labs Reviewed: Creat: 0.75 (12/28/2009)     Last Eye Exam: no retniopathy. glaucoma noted (04/19/2010) Reviewed HgBA1c results: 8.0 (07/15/2010)  9.8 (12/25/2009)  Problem # 4:  Preventive Health Care (ICD-V70.0) Flu shot today. Information for self referral for mammogram.   Complete Medication List: 1)  Albuterol 90 Mcg/act Aers (Albuterol) .... Inhale 2 puff using inhaler every four hours 2)  Flovent Hfa 110 Mcg/act Aero (Fluticasone propionate  hfa) .... Inhale 2 puff using inhaler twice a day 3)  Zolpidem Tartrate 10 Mg Tabs (Zolpidem tartrate) .... Take 1 tablet by mouth at bedtime. 4)  Aspirin Ec 81 Mg Tbec (Aspirin) .... Take 1 tablet by mouth once a day 5)  Cozaar 50 Mg Tabs (Losartan potassium) .... Take 1 tablet by mouth once a day 6)  Furosemide 20 Mg Tabs (Furosemide) .... One tab by mouth qday 7)  Glyburide-metformin 5-500 Mg Tabs (Glyburide-metformin) .... 2 by mouth two times a day for diabetes.  replaces glipizide-metformin combination 8)  Hydrochlorothiazide 12.5 Mg Caps (Hydrochlorothiazide) .... One tab by mouth qday 9)  Omeprazole 40 Mg Cpdr (Omeprazole) .Marland Kitchen.. 1 by mouth two times a day for reflux 10)  Sm Calcium/vitamin D 500-200 Mg-unit Tabs  (Calcium carbonate-vitamin d) .... Take 2 tablet by mouth once a day 11)  Fluoxetine Hcl 20 Mg Tabs (Fluoxetine hcl) .... One tab by mouth qday 12)  Klor-con 20 Meq Pack (Potassium chloride) .... One tablet by mouth qday 13)  Clonazepam 0.5 Mg Tabs (Clonazepam) .Marland Kitchen.. 1 tab by mouth two times a day as needed anxiety. 14)  Propranolol Hcl 40 Mg Tabs (Propranolol hcl) .Marland Kitchen.. 1  by mouth two times a day for tremor 15)  Albuterol Sulfate (2.5 Mg/59ml) 0.083% Nebu (Albuterol sulfate) .... Qid as needed 16)  Triamcinolone Acetonide 0.5 % Crea (Triamcinolone acetonide) .... Apply to itchy areas two times a day until 2-3 days after healed. disp 30g 17)  Zostavax 16109 Unt/0.72ml Solr (Zoster vaccine live) .... Bring to [facility] for  [place] to give to you for shingles prevention .disp #1. 18)  Januvia 100 Mg Tabs (Sitagliptin phosphate) .... Once daily for diabetes 19)  Wrist Brace (left Wrist)  .... Left wrist brace - wear as needed for pain - may substitue with brace covered by insurance  Other Orders: A1C-[facility] (60454) Comp Met-[facility] (09811-91478) TSH-[facility] (29562-13086) Direct LDL-[facility] (57846-96295) Influenza Vaccine NON MCR (28413)  Patient Instructions: 1)  Follow up with me in three months.  2)  Take Tylenol as needed for pain.  3)  Wear the wrist brace - if things get worse with it then stop using it.  Prescriptions: ZOLPIDEM TARTRATE 10 MG TABS (ZOLPIDEM TARTRATE) Take 1 tablet by mouth at bedtime.  #20 x 0   Entered and Authorized by:   Bobby Rumpf  MD   Signed by:   Bobby Rumpf  MD on 07/15/2010   Method used:   Print then Give to Patient   RxID:   [id number] WRIST BRACE (  LEFT WRIST) Left wrist brace - wear as needed for pain - may substitue with brace covered by insurance  #1 x 0   Entered and Authorized by:   Bobby Rumpf  MD   Signed by:   Bobby Rumpf  MD on 07/15/2010   Method used:   Print then Give to Patient   RxID:   [id number]    Orders Added: 1)  A1C-[facility] [83036] 2)  Comp  Met-[facility] [14782-95621] 3)  TSH-[facility] [30865-78469] 4)  Radiology other [Radiology Other] 5)  Direct LDL-[facility] [62952-84132] 6)  Influenza Vaccine NON MCR [00028]   Immunizations Administered:  Influenza Vaccine # 1:    Vaccine Type: Fluvax Non-MCR    Site: left deltoid    Mfr: GlaxoSmithKline    Dose: 0.5 ml    Route: IM    Given by: Arlyss Repress CMA,    Exp. Date: 02/05/2011    Lot #: [id number]    VIS given: 03/02/10 version given July 15, 2010.  Flu Vaccine Consent Questions:    Do you have a history of severe allergic reactions to this vaccine? no    Any prior history of allergic reactions to egg and/or gelatin? no    Do you have a sensitivity to the preservative Thimersol? no    Do you have a past history of Guillan-Barre Syndrome? no    Do you currently have an acute febrile illness? no    Have you ever had a severe reaction to latex? no    Vaccine information given and explained to patient? yes    Are you currently pregnant? no   Immunizations Administered:  Influenza Vaccine # 1:    Vaccine Type: Fluvax Non-MCR    Site: left deltoid    Mfr: GlaxoSmithKline    Dose: 0.5 ml    Route: IM    Given by: Arlyss Repress CMA,    Exp. Date: 02/05/2011    Lot #: [id number]    VIS given: 03/02/10 version given July 15, 2010.    Laboratory Results   Blood Tests   Date/Time Received: July 15, 2010 9:17 AM  Date/Time Reported: July 15, 2010 10:13 AM   HGBA1C: 8.0%   (Normal Range: Non-Diabetic - 3-6%   Control Diabetic - 6-8%)  Comments: ...........test performed by...........Marland KitchenTerese Door, CMA      Prevention & Chronic Care Immunizations   Influenza vaccine: Fluvax Non-MCR  (07/15/2010)   Influenza vaccine due: 05/28/2009    Tetanus booster: 01/06/2002: Done.   Tetanus booster due: 01/07/2012    Pneumococcal vaccine: Pneumovax (Medicare)  (06/01/2007)   Pneumococcal vaccine due: None    H. zoster vaccine: Not documented  Colorectal  Screening   Hemoccult: Done.  (12/06/2001)   Hemoccult due: Not Indicated    Colonoscopy: Done.  (09/14/2005)   Colonoscopy due: 09/15/2015  Other Screening   Pap smear: normal  (11/26/2007)   Pap smear due: 11/26/2010    Mammogram: normal  (11/01/2007)   Mammogram action/deferral: Ordered  (12/25/2009)   Mammogram due: 10/31/2008    DXA bone density scan: Not documented   Smoking status: never  (07/15/2010)  Diabetes Mellitus   HgbA1C: 8.0  (07/15/2010)   Hemoglobin A1C due: 01/11/2008    Eye exam: no retniopathy. glaucoma noted  (04/19/2010)   Eye exam due: 04/20/2011    Foot exam: yes  (07/15/2010)   High risk foot: Not documented   Foot care education: completed  (01/19/2009)   Foot exam due: 01/19/2010  Urine microalbumin/creatinine ratio: Not documented   Urine microalbumin action/deferral: Not indicated    Diabetes flowsheet reviewed?: Yes   Progress toward A1C goal: Improved  Lipids   Total Cholesterol: 201  (12/28/2009)   LDL: 120  (12/28/2009)   LDL Direct: Not documented   HDL: 37  (12/28/2009)   Triglycerides: 218  (12/28/2009)    SGOT (AST): 19  (12/28/2009)   SGPT (ALT): 22  (12/28/2009) CMP ordered    Alkaline phosphatase: 54  (12/28/2009)   Total bilirubin: 0.3  (12/28/2009)    Lipid flowsheet reviewed?: Yes   Progress toward LDL goal: Unchanged  Hypertension   Last Blood Pressure: 128 / 80  (07/15/2010)   Serum creatinine: 0.75  (12/28/2009)   Serum potassium 4.0  (12/28/2009) CMP ordered     Hypertension flowsheet reviewed?: Yes   Progress toward BP goal: At goal  Self-Management Support :   Personal Goals (by the next clinic visit) :     Personal A1C goal: 8  (12/25/2009)     Personal blood pressure goal: 130/80  (12/25/2009)     Personal LDL goal: 100  (12/25/2009)    Patient will work on the following items until the next clinic visit to reach self-care goals:     Medications and monitoring: take my medicines every day,  check my blood sugar, check my blood pressure, bring all of my medications to every visit, examine my feet every day  (07/15/2010)     Eating: drink diet soda or water instead of juice or soda, eat more vegetables, use fresh or frozen vegetables, eat foods that are low in salt, eat baked foods instead of fried foods, eat fruit for snacks and desserts  (07/15/2010)     Activity: take a 30 minute walk every day  (07/15/2010)    Diabetes self-management support: Written self-care plan, Education handout  (01/05/2010)    Diabetes self-management support not done because: Good outcomes  (07/15/2010)   Last diabetes self-management training by diabetes educator: completed    Hypertension self-management support: Not documented    Hypertension self-management support not done because: Good outcomes  (07/15/2010)    Lipid self-management support: Written self-care plan  (07/15/2010)   Lipid self-care plan printed.    Lipid self-management support not done because: Not indicated  (01/05/2010)   Appended Document: meet new MD/eo (ORDERS)

## 2010-09-07 NOTE — Miscellaneous (Signed)
Summary: duplicate therapy?  Clinical Lists Changes [facility] sent a fax asking if md wanted her on both lasix & HCTZ. they need pcp to sign responce. to Dr. Wallene Huh.Golden Circle RN  February 03, 2010 9:45 AM  signed order for HCTZ and lasix. Back to Golden Circle RN. Bobby Rumpf  MD  February 03, 2010 9:55 AM

## 2010-09-07 NOTE — Progress Notes (Signed)
       Additional Follow-up for Phone Call Additional follow up Details #2::    rec'd message from [facility] to call in refills on 2 meds. per chart they have already been sent to [facility]. I called the given number to tell them that & got a recording about connecting to local exciting ladies. I hung up & tried again. same message. obviously wrong number. [facility] can fax if they did not get the ones md sent last wk Follow-up by: Golden Circle RN,  March 05, 2010 11:12 AM

## 2010-09-07 NOTE — Progress Notes (Signed)
Summary: A1C  Phone Note Call from Patient Call back at Work Phone [phone]   Reason for Call: Talk to Doctor Summary of Call: pt called to find out what her A1C level was, pt was a little disappointed that it was still some what high, wants Dr. Wallene Huh to call if he is concerned.  Initial call taken by: Knox Royalty,  July 15, 2010 12:18 PM  Follow-up for Phone Call        Attempted to return call. No answer at number provided. Called home - spoke with patient. Advised her that her A1C was improved from 9.8 to 8.0 and that she should continue to monitor her sugars, exercise and take her medications. She was happy with this and agrees to do so.  Follow-up by: Bobby Rumpf  MD,  July 16, 2010 9:02 AM

## 2010-09-07 NOTE — Letter (Signed)
Summary: Generic Letter  [facility]  [address]   [place], [place] [address]   Phone: [phone]  Fax: [phone]    03/31/2010  Mercedes Barr [address] [place], [place]  [address]  Dear Ms. Mercedes Barr,  Please come in for lab work and to be seen by your new doctor in 1-2 weeks.      Sincerely,   Bobby Rumpf  MD  Appended Document: Generic Letter mailed

## 2010-09-07 NOTE — Consult Note (Signed)
Summary: [facility]  [facility]   Imported By: Clydell Hakim 09/25/2009 16:35:15  _____________________________________________________________________  External Attachment:    Type:   Image     Comment:   External Document

## 2010-09-07 NOTE — Consult Note (Signed)
Summary: [facility]   [facility]   Imported By: Clydell Hakim 11/26/2009 09:22:49  _____________________________________________________________________  External Attachment:    Type:   Image     Comment:   External Document

## 2010-09-07 NOTE — Consult Note (Signed)
Summary: [facility]  [facility]   Imported By: Clydell Hakim 09/25/2009 16:35:56  _____________________________________________________________________  External Attachment:    Type:   Image     Comment:   External Document

## 2010-09-07 NOTE — Consult Note (Signed)
Summary: [facility]  [facility]   Imported By: Clydell Hakim 09/25/2009 16:34:32  _____________________________________________________________________  External Attachment:    Type:   Image     Comment:   External Document

## 2010-09-07 NOTE — Miscellaneous (Signed)
Summary: neb supplies  completed  for [facility] for albuterol QID + neb supplies  Clinical Lists Changes  Medications: Added new medication of ALBUTEROL SULFATE (2.5 MG/3ML) 0.083% NEBU (ALBUTEROL SULFATE) QID as needed

## 2010-09-07 NOTE — Assessment & Plan Note (Signed)
Summary: CPE,DF   Vital Signs:  Patient profile:   65 year old female Height:      62.75 inches Weight:      154 pounds BMI:     27.60 BSA:     1.73 Temp:     97.9 degrees F Pulse rate:   78 / minute BP sitting:   107 / 68  Vitals Entered By: Jone Baseman CMA (Dec 25, 2009 4:00 PM) CC: CPE Is Patient Diabetic? No Pain Assessment Patient in pain? no        Primary Care Provider:  Ancil Boozer  MD  CC:  CPE.  History of Present Illness: a few concerns today:  itchy rash on legs and abdomen - has been off and on for a few weeks.  no new detergents/soaps/lotions.  no associated fever.  no known bites.  has been using fiance's cream (?steroid) which has helped.  also tried zyrtec but this didn't help  diabetes: doesn't think it has been under as good control as it has in the past.  is due for eye examination - state she is on an eye gtt for glaucoma which she cannot remember the name of and she will call me with the name.   preventative care: due for zostervax and mammogram   Habits & Providers  Alcohol-Tobacco-Diet     Alcohol drinks/day: 0     Tobacco Status: never  Exercise-Depression-Behavior     Have you felt down or hopeless? no     Have you felt little pleasure in things? no     STD Risk: never     Drug Use: never  Current Medications (verified): 1)  Albuterol 90 Mcg/act Aers (Albuterol) .... Inhale 2 Puff Using Inhaler Every Four Hours 2)  Flovent Hfa 110 Mcg/act Aero (Fluticasone Propionate  Hfa) .... Inhale 2 Puff Using Inhaler Twice A Day 3)  Zolpidem Tartrate 10 Mg Tabs (Zolpidem Tartrate) .... Take 1 Tablet By Mouth At Bedtime. 4)  Aspirin Ec 81 Mg Tbec (Aspirin) .... Take 1 Tablet By Mouth Once A Day 5)  Cozaar 50 Mg Tabs (Losartan Potassium) .... Take 1 Tablet By Mouth Once A Day 6)  Furosemide 20 Mg Tabs (Furosemide) .... One Tab By Mouth Qday 7)  Glyburide-Metformin 5-500 Mg Tabs (Glyburide-Metformin) .... 2 By Mouth Two Times A Day For  Diabetes.  Replaces Glipizide-Metformin Combination 8)  Hydrochlorothiazide 12.5 Mg Caps (Hydrochlorothiazide) .... One Tab By Mouth Qday 9)  Omeprazole 40 Mg Cpdr (Omeprazole) .Marland Kitchen.. 1 By Mouth Two Times A Day For Reflux 10)  Sm Calcium/vitamin D 500-200 Mg-Unit Tabs (Calcium Carbonate-Vitamin D) .... Take 2 Tablet By Mouth Once A Day 11)  Fluoxetine Hcl 20 Mg  Tabs (Fluoxetine Hcl) .... One Tab By Mouth Qday 12)  Klor-Con 20 Meq  Pack (Potassium Chloride) .... One Tablet By Mouth Qday 13)  Clonazepam 0.5 Mg Tabs (Clonazepam) .Marland Kitchen.. 1 Tab By Mouth Two Times A Day As Needed Anxiety. 14)  Propranolol Hcl 40 Mg Tabs (Propranolol Hcl) .Marland Kitchen.. 1  By Mouth Two Times A Day For Tremor 15)  Albuterol Sulfate (2.5 Mg/87ml) 0.083% Nebu (Albuterol Sulfate) .... Qid As Needed 16)  Triamcinolone Acetonide 0.5 % Crea (Triamcinolone Acetonide) .... Apply To Itchy Areas Two Times A Day Until 2-3 Days After Healed. Disp 30g 17)  Zostavax 16109 Unt/0.18ml Solr (Zoster Vaccine Live) .... Bring To [facility] For  [place] To Give To You For Shingles Prevention .disp #1.  Allergies: 1)  ! Naprosyn 2)  Lotensin (  Benazepril Hcl) 3)  Tylox (Oxycodone-Acetaminophen) 4)  Amitriptyline Hcl (Amitriptyline Hcl) 5)  Darvocet-N 100  Past History:  Past Medical History: Last updated: 09/03/2008 h/o of rotator cuff tendonitis,  IHSS Menopause at 65 y/o trace microalbumin 08/2005 right hip osteoarthritis Type II DM-last eye exam 1/09-no retinopathy-f/u in one year CHF-diastolic dysfunction hypercholesterolemia pacemaker for complete heart block Hypertension depression/anxiety  Family History: Last updated: 12/25/2009 (total 3 brothers) Brother deceased wth prostate cancer,  Brother: alcohol abuse, ? cirrhosis, DM Mom w/ HTN and CHF. Sisters w/ HTN 2nd degree relatives with breast cancer.   Social History: Last updated: 12/25/2009 No tobacco use.  No alcohol use. Retired on disability. Divorced from first husband. Lives with  fiance Mr. Ruffin Frederick, her son Ivar Drape .  Has 6 grandkids, 5 greatgrand kids.  She has 4 children (2 girls, 2 boys - oldest daughter murdered in 1991 around [place] - struggles with depression every christmas)  Risk Factors: Alcohol Use: 0 (12/25/2009) Exercise: yes (11/06/2006)  Risk Factors: Smoking Status: never (12/25/2009)  Past Surgical History: Surgeries:  tubal ligation - 1972 open heart surgery - fix 2 valves + "hole in the heart" repair. 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 cirrhosis, DM Mom w/ HTN and CHF. Sisters w/ HTN 2nd degree relatives with breast cancer.   Social History: No tobacco use.  No alcohol use. Retired on disability. Divorced from first husband. Lives with fiance Mr. Ruffin Frederick, her son Ivar Drape .  Has 6 grandkids, 5 greatgrand kids.  She has 4 children (2 girls, 2 boys - oldest daughter murdered in 68 around [place] - struggles with depression every christmas)Drug Use:  never STD Risk:  never  Review of Systems       No vision changes, no hearing changes, no sore throat, rhinorrhea, cough, shortness of breath, chest pain, abdominal pain, change in bowel habits, diarrhea, constipation, melena, BRBPR, dysuria, urinary frequency, joint aches or pains.  otherwise negative or per HPI.   Physical Exam  General:  Well-developed,well-nourished,in no acute distress; alert,appropriate and cooperative throughout examination VS noted  Head:  Normocephalic and atraumatic without obvious abnormalities. No apparent alopecia or balding. Eyes:  vision grossly intact, pupils equal, pupils round, pupils reactive to light, and no injection.   Ears:  External ear exam shows no significant lesions or  deformities.  Otoscopic examination reveals clear canals, tympanic membranes are intact bilaterally without bulging, retraction, inflammation or discharge. Hearing is grossly normal bilaterally. Nose:  External nasal examination shows no deformity or inflammation. Nasal mucosa are pink and moist without lesions or exudates. Mouth:  Oral mucosa and oropharynx without lesions or exudates.  Neck:  No deformities, masses, or tenderness noted. Lungs:  Normal respiratory effort, chest expands symmetrically. Lungs are clear to auscultation, no crackles or wheezes. Heart:  Normal rate and regular rhythm. S1 and S2 normal .Grade   2/6 soft systolic ejection murmur.   Abdomen:  Bowel sounds positive,abdomen soft and non-tender without masses, organomegaly or hernias noted. Genitalia:  Normal introitus for age, no external lesions, no vaginal discharge, mucosa pink and moist, no vaginal or cervical lesions, no vaginal atrophy, no friaility or hemorrhage, normal uterus size and position, no adnexal masses or tenderness Msk:  no major joint abnormalities grossly identified Pulses:  2+  in all extremities Extremities:  no edema Neurologic:  alert & oriented X3, cranial nerves II-XII intact, strength normal in all extremities, and gait normal.   Skin:  small papules on erythematous base in linear like fashion on upper R thigh with signs of excoriation Psych:  Oriented X3, normally interactive, good eye contact, not anxious appearing, and not depressed appearing.    Diabetes Management Exam:    Foot Exam (with socks and/or shoes not present):       Sensory-Pinprick/Light touch:          Left medial foot (L-4): normal          Left dorsal foot (L-5): normal          Left lateral foot (S-1): normal          Right medial foot (L-4): normal          Right dorsal foot (L-5): normal          Right lateral foot (S-1): normal       Sensory-Monofilament:          Left foot: normal          Right foot: normal        Inspection:          Left foot: abnormal             Comments: callouses on heels          Right foot: abnormal             Comments: callouses on heels       Nails:          Left foot: thickened          Right foot: thickened   Impression & Recommendations:  Problem # 1:  ROUTINE GYNECOLOGICAL EXAMINATION (ICD-V72.31) Assessment Unchanged  overall doing well.  i will have her return to do more indepth counseling on her diabetes to achieve better control.  she agrees.  given handout on how to schedule mammogram.  not due for pap for another year. will have her come back fasting for screening bloodwork  Orders: [facility] - Est  40-64 yrs (29528)  Complete Medication List: 1)  Albuterol 90 Mcg/act Aers (Albuterol) .... Inhale 2 puff using inhaler every four hours 2)  Flovent Hfa 110 Mcg/act Aero (Fluticasone propionate  hfa) .... Inhale 2 puff using inhaler twice a day 3)  Zolpidem Tartrate 10 Mg Tabs (Zolpidem tartrate) .... Take 1 tablet by mouth at bedtime. 4)  Aspirin Ec 81 Mg Tbec (Aspirin) .... Take 1 tablet by mouth once a day 5)  Cozaar 50 Mg Tabs (Losartan potassium) .... Take 1 tablet by mouth once a day 6)  Furosemide 20 Mg Tabs (Furosemide) .... One tab by mouth qday 7)  Glyburide-metformin 5-500 Mg Tabs (Glyburide-metformin) .... 2 by mouth two times a day for diabetes.  replaces glipizide-metformin combination 8)  Hydrochlorothiazide 12.5 Mg Caps (Hydrochlorothiazide) .... One tab by mouth qday 9)  Omeprazole 40 Mg Cpdr (Omeprazole) .Marland Kitchen.. 1 by mouth two times a day for reflux 10)  Sm Calcium/vitamin D 500-200 Mg-unit Tabs (Calcium carbonate-vitamin d) .... Take 2 tablet by mouth once a day 11)  Fluoxetine Hcl 20 Mg Tabs (Fluoxetine hcl) .... One tab by mouth qday 12)  Klor-con 20 Meq Pack (Potassium chloride) .... One tablet by mouth qday 13)  Clonazepam 0.5 Mg Tabs (Clonazepam) .Marland Kitchen.. 1 tab by mouth two times a day as needed anxiety. 14)  Propranolol Hcl 40  Mg Tabs (Propranolol  hcl) .Marland Kitchen.. 1  by mouth two times a day for tremor 15)  Albuterol Sulfate (2.5 Mg/54ml) 0.083% Nebu (Albuterol sulfate) .... Qid as needed 16)  Triamcinolone Acetonide 0.5 % Crea (Triamcinolone acetonide) .... Apply to itchy areas two times a day until 2-3 days after healed. disp 30g 17)  Zostavax 16109 Unt/0.10ml Solr (Zoster vaccine live) .... [facility] for  [place] to give to you for shingles prevention .disp #1.  Other Orders: Pap Smear-[facility] ([id number]) A1C-[facility] [phone]) Future Orders: Comp Met-[facility] ([id number]) ... 12/16/2010 Lipid-[facility] ([id number]) ... 12/10/2010 CBC-[facility] ([id number]) ... 12/11/2010 TSH-[facility] ([id number]) ... 12/10/2010  Patient Instructions: 1)  Please schedule a morning lab appt to get some blood work checked and then Please follow up with me in about 2-3 weeks so we can discuss more on your diabetes.  Mr Valentino Saxon is due for a visit for his diabetes also so you can both go ahead and schedule to talk about it. 2)  Be sure to get your yearly eye exam and have the results sent to me. 3)  Try the cream for your rash and look for something that may be biting you. 4)  Schedule a mammogram. 5)  Be sure to take the zostervax prescription to the pharmacy to get filled.  Prescriptions: ZOSTAVAX 36644 UNT/0.65ML SOLR (ZOSTER VACCINE LIVE) bring to [facility] for  [place] to give to you for shingles prevention .disp #1.  #1 x 0   Entered and Authorized by:   Ancil Boozer  MD   Signed by:   Ancil Boozer  MD on 12/25/2009   Method used:   Print then Give to Patient   RxID:   [id number] TRIAMCINOLONE ACETONIDE 0.5 % CREA (TRIAMCINOLONE ACETONIDE) apply to itchy areas two times a day until 2-3 days after healed. disp 30g  #30 x 2   Entered and Authorized by:   Ancil Boozer  MD   Signed by:   Ancil Boozer  MD on 12/25/2009   Method used:   Electronically to        [facility]  [address] [phone]* (retail)       [address]       [place], [place]  [id number]        Ph: [phone] or [phone]       Fax: [phone]   RxID:   [id number]   Prevention & Chronic Care Immunizations   Influenza vaccine: Fluvax MCR  (07/07/2009)   Influenza vaccine due: 05/28/2009    Tetanus booster: 01/06/2002: Done.   Tetanus booster due: 01/07/2012    Pneumococcal vaccine: Pneumovax (Medicare)  (06/01/2007)   Pneumococcal vaccine due: None    H. zoster vaccine: Not documented    Immunization comments: given handout for zostavax  Colorectal Screening   Hemoccult: Done.  (12/06/2001)   Hemoccult due: Not Indicated    Colonoscopy: Done.  (09/14/2005)   Colonoscopy due: 09/15/2015  Other Screening   Pap smear: normal  (11/26/2007)   Pap smear due: 11/26/2010    Mammogram: normal  (11/01/2007)   Mammogram action/deferral: Ordered  (12/25/2009)   Mammogram due: 10/31/2008    DXA bone density scan: Not documented   Smoking status: never  (12/25/2009)  Diabetes Mellitus   HgbA1C: 9.8  (12/25/2009)   Hemoglobin A1C due: 01/11/2008    Eye exam: Not documented    Foot exam: yes  (12/25/2009)   High risk foot: Not documented   Foot care  education: completed  (01/19/2009)   Foot exam due: 01/19/2010    Urine microalbumin/creatinine ratio: Not documented   Urine microalbumin action/deferral: Not indicated    Diabetes flowsheet reviewed?: Yes   Progress toward A1C goal: Deteriorated   Diabetes comments: scheduling separate visit for more in depth discussion/   Lipids   Total Cholesterol: 181  (09/03/2008)   LDL: 98  (09/03/2008)   LDL Direct: Not documented   HDL: 39  (09/03/2008)   Triglycerides: 219  (09/03/2008)    SGOT (AST): 17  (09/03/2008)   SGPT (ALT): 18  (09/03/2008) CMP ordered    Alkaline phosphatase: 49  (09/03/2008)   Total bilirubin: 0.2  (09/03/2008)    Lipid flowsheet reviewed?: Yes   Progress toward LDL goal: At goal  Hypertension   Last Blood Pressure: 107 / 68  (12/25/2009)   Serum creatinine: 0.82   (09/03/2008)   Serum potassium 4.3  (09/03/2008) CMP ordered     Hypertension flowsheet reviewed?: Yes   Progress toward BP goal: At goal  Self-Management Support :   Personal Goals (by the next clinic visit) :     Personal A1C goal: 8  (12/25/2009)     Personal blood pressure goal: 130/80  (12/25/2009)     Personal LDL goal: 100  (12/25/2009)    Diabetes self-management support: Not documented    Diabetes self-management support not done because: Not indicated  (12/25/2009)   Last diabetes self-management training by diabetes educator: completed    Hypertension self-management support: Not documented    Hypertension self-management support not done because: Good outcomes  (12/25/2009)    Lipid self-management support: Not documented     Lipid self-management support not done because: Good outcomes  (12/25/2009)    Self-management comments: scheduling separate appt to discuss diabetes goals and how to improve.    Laboratory Results   Blood Tests   Date/Time Received: Dec 25, 2009 4:08 PM  Date/Time Reported: Dec 25, 2009 4:31 PM   HGBA1C: 9.8%   (Normal Range: Non-Diabetic - 3-6%   Control Diabetic - 6-8%)  Comments: ...........test performed by...........Marland KitchenTerese Door, CMA

## 2010-09-07 NOTE — Assessment & Plan Note (Signed)
Summary: f/u dm per Dr. Nigel Wessman/bmc   Vital Signs:  Patient profile:   65 year old female Weight:      153 pounds Temp:     98.1 degrees F BP sitting:   92 / 60  (left arm)  Vitals Entered By: Theresia Lo RN (Jan 05, 2010 11:35 AM) CC: diabetic  check  and left hip pain Is Patient Diabetic? Yes Pain Assessment Patient in pain? yes     Location: left hip Intensity: 8 Type: sharp and aching   Serial Vital Signs/Assessments:  Time      Position  BP       Pulse  Resp  Temp     By 11:35 AM            94/62                          Theresia Lo RN  repeat BP checked RA. Theresia Lo RN  Jan 05, 2010 11:39 AM   Primary Care Provider:  Ancil Boozer  MD  CC:  diabetic  check  and left hip pain.  History of Present Illness: f/u diabetes:   states she is taking her medicines as prescribed without problems (other than her previous statin had to be stopped due to leg pains but Dr Clarene Duke is addressing this with plans to restart her statin in September of this year).  thinks her sugars are up because of stress and knowing she isn't eating right.   L hip pain: for about 10 days.  started with just a twinge.  no can't sleep on left side.  feels like it catches and has a pain.  will run part way down the thigh.  denies injury.  tried extra strenght tylenol which will ease it off a little.  thinks her gait has been off a little due to her R knee flaring up a little.  denies swelling. denies fevers.  Habits & Providers  Alcohol-Tobacco-Diet     Tobacco Status: never  Current Medications (verified): 1)  Albuterol 90 Mcg/act Aers (Albuterol) .... Inhale 2 Puff Using Inhaler Every Four Hours 2)  Flovent Hfa 110 Mcg/act Aero (Fluticasone Propionate  Hfa) .... Inhale 2 Puff Using Inhaler Twice A Day 3)  Zolpidem Tartrate 10 Mg Tabs (Zolpidem Tartrate) .... Take 1 Tablet By Mouth At Bedtime. 4)  Aspirin Ec 81 Mg Tbec (Aspirin) .... Take 1 Tablet By Mouth Once A Day 5)  Cozaar 50 Mg Tabs  (Losartan Potassium) .... Take 1 Tablet By Mouth Once A Day 6)  Furosemide 20 Mg Tabs (Furosemide) .... One Tab By Mouth Qday 7)  Glyburide-Metformin 5-500 Mg Tabs (Glyburide-Metformin) .... 2 By Mouth Two Times A Day For Diabetes.  Replaces Glipizide-Metformin Combination 8)  Hydrochlorothiazide 12.5 Mg Caps (Hydrochlorothiazide) .... One Tab By Mouth Qday 9)  Omeprazole 40 Mg Cpdr (Omeprazole) .Marland Kitchen.. 1 By Mouth Two Times A Day For Reflux 10)  Sm Calcium/vitamin D 500-200 Mg-Unit Tabs (Calcium Carbonate-Vitamin D) .... Take 2 Tablet By Mouth Once A Day 11)  Fluoxetine Hcl 20 Mg  Tabs (Fluoxetine Hcl) .... One Tab By Mouth Qday 12)  Klor-Con 20 Meq  Pack (Potassium Chloride) .... One Tablet By Mouth Qday 13)  Clonazepam 0.5 Mg Tabs (Clonazepam) .Marland Kitchen.. 1 Tab By Mouth Two Times A Day As Needed Anxiety. 14)  Propranolol Hcl 40 Mg Tabs (Propranolol Hcl) .Marland Kitchen.. 1  By Mouth Two Times A Day For Tremor 15)  Albuterol  Sulfate (2.5 Mg/13ml) 0.083% Nebu (Albuterol Sulfate) .... Qid As Needed 16)  Triamcinolone Acetonide 0.5 % Crea (Triamcinolone Acetonide) .... Apply To Itchy Areas Two Times A Day Until 2-3 Days After Healed. Disp 30g 17)  Zostavax 57846 Unt/0.1ml Solr (Zoster Vaccine Live) .... Bring To [facility] For  [place] To Give To You For Shingles Prevention .disp #1. 18)  Januvia 100 Mg Tabs (Sitagliptin Phosphate) .... Once Daily For Diabetes  Allergies (verified): 1)  ! Naprosyn 2)  ! * Statins 3)  Lotensin (Benazepril Hcl) 4)  Tylox (Oxycodone-Acetaminophen) 5)  Amitriptyline Hcl (Amitriptyline Hcl) 6)  Darvocet-N 100  Past History:  PMH reviewed for relevance  Review of Systems       per HPI  Physical Exam  General:  Well-developed,well-nourished,in no acute distress; alert,appropriate and cooperative throughout examination VS noted - blood pressure low but patient denies dizziness, passing out.  Lungs:  Normal respiratory effort, chest expands symmetrically. Lungs are clear to auscultation, no  crackles or wheezes. Heart:  Normal rate and regular rhythm. S1 and S2 normal .Grade   2/6 soft systolic ejection murmur.   Msk:  left hip excrutiatinly tender over trochanteric bursa.  no redness, swelling or warmth. FROM of left hip without pain. normal gait Additional Exam:  procedure note: verbal consent obtained. patient placed in r lateral decubitus position. area of worst tenderness on left hip found and marked area prepped in sterile fashion with alcohol ethyl chloride spray applied to area for anesthesia 1:3 kenalog 40: 2%xylocaine injected patient tolerated well no blood loss.    Impression & Recommendations:  Problem # 1:  DM (ICD-250.00) Assessment Deteriorated  add januvia at this time in addition to watching diet will defer statin to her cardiologist but did encourage fish oils f/u 3 months  Her updated medication list for this problem includes:    Aspirin Ec 81 Mg Tbec (Aspirin) .Marland Kitchen... Take 1 tablet by mouth once a day    Cozaar 50 Mg Tabs (Losartan potassium) .Marland Kitchen... Take 1 tablet by mouth once a day    Glyburide-metformin 5-500 Mg Tabs (Glyburide-metformin) .Marland Kitchen... 2 by mouth two times a day for diabetes.  replaces glipizide-metformin combination    Januvia 100 Mg Tabs (Sitagliptin phosphate) ..... Once daily for diabetes  Orders: [facility]- Est Level  3 (96295)  Problem # 2:  TROCHANTERIC BURSITIS, LEFT (ICD-726.5) Assessment: New  injected today.  monitor progress.  Orders: Injection, small joint- [facility] (20600)  Complete Medication List: 1)  Albuterol 90 Mcg/act Aers (Albuterol) .... Inhale 2 puff using inhaler every four hours 2)  Flovent Hfa 110 Mcg/act Aero (Fluticasone propionate  hfa) .... Inhale 2 puff using inhaler twice a day 3)  Zolpidem Tartrate 10 Mg Tabs (Zolpidem tartrate) .... Take 1 tablet by mouth at bedtime. 4)  Aspirin Ec 81 Mg Tbec (Aspirin) .... Take 1 tablet by mouth once a day 5)  Cozaar 50 Mg Tabs (Losartan potassium) .... Take 1 tablet by  mouth once a day 6)  Furosemide 20 Mg Tabs (Furosemide) .... One tab by mouth qday 7)  Glyburide-metformin 5-500 Mg Tabs (Glyburide-metformin) .... 2 by mouth two times a day for diabetes.  replaces glipizide-metformin combination 8)  Hydrochlorothiazide 12.5 Mg Caps (Hydrochlorothiazide) .... One tab by mouth qday 9)  Omeprazole 40 Mg Cpdr (Omeprazole) .Marland Kitchen.. 1 by mouth two times a day for reflux 10)  Sm Calcium/vitamin D 500-200 Mg-unit Tabs (Calcium carbonate-vitamin d) .... Take 2 tablet by mouth once a day 11)  Fluoxetine Hcl 20  Mg Tabs (Fluoxetine hcl) .... One tab by mouth qday 12)  Klor-con 20 Meq Pack (Potassium chloride) .... One tablet by mouth qday 13)  Clonazepam 0.5 Mg Tabs (Clonazepam) .Marland Kitchen.. 1 tab by mouth two times a day as needed anxiety. 14)  Propranolol Hcl 40 Mg Tabs (Propranolol hcl) .Marland Kitchen.. 1  by mouth two times a day for tremor 15)  Albuterol Sulfate (2.5 Mg/48ml) 0.083% Nebu (Albuterol sulfate) .... Qid as needed 16)  Triamcinolone Acetonide 0.5 % Crea (Triamcinolone acetonide) .... Apply to itchy areas two times a day until 2-3 days after healed. disp 30g 17)  Zostavax 16109 Unt/0.54ml Solr (Zoster vaccine live) .... Bring to [facility] for  [place] to give to you for shingles prevention .disp #1. 18)  Januvia 100 Mg Tabs (Sitagliptin phosphate) .... Once daily for diabetes  Patient Instructions: 1)  Consider starting taking some Omega 3 fish oil to help your cholesterol. 2)  Start the new pill for your diabetes. 3)  Please follow up for diabetes in 3 months or of course sooner if needed. Prescriptions: JANUVIA 100 MG TABS (SITAGLIPTIN PHOSPHATE) once daily for diabetes  #30 x 6   Entered and Authorized by:   Ancil Boozer  MD   Signed by:   Ancil Boozer  MD on 01/05/2010   Method used:   Electronically to        [facility] [address] [phone]* (retail)       [address]       [place], [place]  [id number]       Ph: [phone] or [phone]       Fax:  [phone]   RxID:   [id number]    Vital Signs:  Patient profile:   65 year old female Weight:      153 pounds Temp:     98.1 degrees F BP sitting:   92 / 60  (left arm)  Vitals Entered By: Theresia Lo RN (Jan 05, 2010 11:35 AM)    Prevention & Chronic Care Immunizations   Influenza vaccine: Fluvax MCR  (07/07/2009)   Influenza vaccine due: 05/28/2009    Tetanus booster: 01/06/2002: Done.   Tetanus booster due: 01/07/2012    Pneumococcal vaccine: Pneumovax (Medicare)  (06/01/2007)   Pneumococcal vaccine due: None    H. zoster vaccine: Not documented  Colorectal Screening   Hemoccult: Done.  (12/06/2001)   Hemoccult due: Not Indicated    Colonoscopy: Done.  (09/14/2005)   Colonoscopy due: 09/15/2015  Other Screening   Pap smear: normal  (11/26/2007)   Pap smear due: 11/26/2010    Mammogram: normal  (11/01/2007)   Mammogram action/deferral: Ordered  (12/25/2009)   Mammogram due: 10/31/2008    DXA bone density scan: Not documented   Smoking status: never  (01/05/2010)  Diabetes Mellitus   HgbA1C: 9.8  (12/25/2009)   Hemoglobin A1C due: 01/11/2008    Eye exam: Not documented    Foot exam: yes  (12/25/2009)   High risk foot: Not documented   Foot care education: completed  (01/19/2009)   Foot exam due: 01/19/2010    Urine microalbumin/creatinine ratio: Not documented   Urine microalbumin action/deferral: Not indicated    Diabetes flowsheet reviewed?: Yes   Progress toward A1C goal: Deteriorated  Lipids   Total Cholesterol: 201  (12/28/2009)   LDL: 120  (12/28/2009)   LDL Direct: Not documented   HDL: 37  (12/28/2009)   Triglycerides: 218  (12/28/2009)  SGOT (AST): 19  (12/28/2009)   SGPT (ALT): 22  (12/28/2009)   Alkaline phosphatase: 54  (12/28/2009)   Total bilirubin: 0.3  (12/28/2009)    Lipid flowsheet reviewed?: Yes   Progress toward LDL goal: Unchanged  Hypertension   Last Blood Pressure: 92 / 60  (01/05/2010)   Serum  creatinine: 0.75  (12/28/2009)   Serum potassium 4.0  (12/28/2009)    Hypertension flowsheet reviewed?: Yes   Progress toward BP goal: At goal  Self-Management Support :   Personal Goals (by the next clinic visit) :     Personal A1C goal: 8  (12/25/2009)     Personal blood pressure goal: 130/80  (12/25/2009)     Personal LDL goal: 100  (12/25/2009)    Diabetes self-management support: Written self-care plan, Education handout  (01/05/2010)   Diabetes care plan printed   Diabetes education handout printed    Diabetes self-management support not done because: Not indicated  (12/25/2009)   Last diabetes self-management training by diabetes educator: completed    Hypertension self-management support: Not documented    Hypertension self-management support not done because: Good outcomes  (01/05/2010)    Lipid self-management support: Not documented     Lipid self-management support not done because: Not indicated  (01/05/2010)    Self-management comments: discussed adding fish oils since cannot tolerate statin at this time for lipids.

## 2010-09-09 NOTE — Progress Notes (Signed)
Summary: meds prob  Phone Note Refill Request Call back at Home Phone [phone] Call back at [phone] Message from:  Patient  Refills Requested: Medication #1:  KLOR-CON 20 MEQ  PACK one tablet by mouth qday   Notes: Medicaid will not longer pay for this - needs something comparable [facility]  Initial call taken by: De Nurse,  July 21, 2010 3:28 PM  Follow-up for Phone Call        new script sent to [facility]. Please notify patient.  Follow-up by: Bobby Rumpf  MD,  July 22, 2010 2:35 PM    New/Updated Medications: POTASSIUM CHLORIDE 20 MEQ PACK (POTASSIUM CHLORIDE) one tab by mouth qday. Prescriptions: POTASSIUM CHLORIDE 20 MEQ PACK (POTASSIUM CHLORIDE) one tab by mouth qday.  #30 x 3   Entered and Authorized by:   Bobby Rumpf  MD   Signed by:   Bobby Rumpf  MD on 07/22/2010   Method used:   Electronically to        [facility]* (mail-order)       [address] [place]  [address]       Ph: [phone]       Fax: [phone]   RxID:   [id number]

## 2010-09-10 ENCOUNTER — Telehealth: Payer: Self-pay | Admitting: Family Medicine

## 2010-09-10 NOTE — Consult Note (Signed)
Summary: Geralynn Rile  [facility]   Imported By: De Nurse 04/20/2010 16:09:09  _____________________________________________________________________  External Attachment:    Type:   Image     Comment:   External Document  Appended Document: [facility]    Clinical Lists Changes  Observations: Added new observation of DMEYEEXAMNXT: 04/20/2011 (04/21/2010 12:25) Added new observation of DBT EY CK DT: 04/19/2010 (04/19/2010 12:26) Added new observation of DIAB EYE EX: no retniopathy. glaucoma noted (04/19/2010 12:26)      Diabetic Eye Exam Date:  04/19/2010 Diabetes Eye Exam Result:  no retniopathy. glaucoma noted Diabetes Eye Exam Due:  1 yr

## 2010-09-15 ENCOUNTER — Ambulatory Visit (INDEPENDENT_AMBULATORY_CARE_PROVIDER_SITE_OTHER): Payer: Medicare Other | Admitting: Family Medicine

## 2010-09-15 ENCOUNTER — Encounter: Payer: Self-pay | Admitting: Family Medicine

## 2010-09-15 VITALS — BP 113/73 | HR 90 | Temp 98.1°F | Ht 62.5 in | Wt 149.5 lb

## 2010-09-15 DIAGNOSIS — K219 Gastro-esophageal reflux disease without esophagitis: Secondary | ICD-10-CM

## 2010-09-15 DIAGNOSIS — J449 Chronic obstructive pulmonary disease, unspecified: Secondary | ICD-10-CM

## 2010-09-15 MED ORDER — GUAIFENESIN-CODEINE 100-10 MG/5ML PO SYRP: 5.0000 mL | ORAL_SOLUTION | Freq: Two times a day (BID) | ORAL | Status: AC | PRN

## 2010-09-15 MED ORDER — PREDNISONE 20 MG PO TABS: 60.0000 mg | ORAL_TABLET | Freq: Every day | ORAL | Status: AC

## 2010-09-15 MED ORDER — DOXYCYCLINE HYCLATE 100 MG PO TABS: 100.0000 mg | ORAL_TABLET | Freq: Two times a day (BID) | ORAL | Status: AC

## 2010-09-15 NOTE — Assessment & Plan Note (Signed)
Think pt has COPD excerebration today. Do not think PNA bc no fever or dyspnea.  Plan: Treat with prednisone and doxy, and albuterol. Will follow up next week if not improving.  Red flags reviewed. Pt voices understanding.

## 2010-09-15 NOTE — Patient Instructions (Addendum)
Take the prednisone 3 pills daily for 5 days.  Take the doxycycline twice a day for 7 days.  Use your breathing pump every 6 hours or as needed for 2 days.  Come back if you dont get better in about 5 days or you get worse or you have trouble breathing or high fever.

## 2010-09-15 NOTE — Progress Notes (Signed)
COPD Patient complains of cough productive of yellow sputum in moderate amounts. Symptoms began 8 day ago. Patient uses 0 pillows at night. Patient currently is not on home oxygen therapy.Marland Kitchen Respiratory history: chronic bronchitis and COPD.  No dyspnea. No fevers or chills. Feels well otherwise. Requests robutussin with codeine cough medication as that has worked in the past for bothersome night time cough.  Tolerated it well despite allergies to oxycodone.   PE: VS noted. Gen: Coughing woman in NAD HEENT: MMM, EOMI, no conjunctival injection. No JVD Lungs: No WOB, CTABL Heart: RRR 2/6 SEM non rad Ext: Warm and well perfused. No edema

## 2010-09-15 NOTE — Progress Notes (Signed)
  Phone Note Outgoing Call   Call placed by: Milinda Antis MD,  September 10, 2010 9:30 AM Details for Reason: Called to check PPI script Summary of Call: No message, I wanted to know exact dose of PPI pt was taking, her records states 40mg  two times a day therefore I will give authorization for this as this is what Dr. Wallene Huh had in his system

## 2010-09-23 NOTE — Consult Note (Signed)
[facility]: [facility]   Imported By: De Nurse 09/10/2010 12:32:28  _____________________________________________________________________  External Attachment:    Type:   Image     Comment:   External Document

## 2010-10-18 ENCOUNTER — Other Ambulatory Visit: Payer: Self-pay | Admitting: Gastroenterology

## 2010-10-20 LAB — POCT RAPID STREP A (OFFICE): Streptococcus, Group A Screen (Direct): NEGATIVE

## 2010-10-25 ENCOUNTER — Telehealth: Payer: Self-pay | Admitting: Family Medicine

## 2010-10-25 NOTE — Telephone Encounter (Signed)
Needs new rx for her albuterol inhaler - send to [facility] - [phone]

## 2010-10-26 ENCOUNTER — Other Ambulatory Visit: Payer: Self-pay | Admitting: Family Medicine

## 2010-10-26 MED ORDER — ALBUTEROL 90 MCG/ACT IN AERS: 2.0000 | INHALATION_SPRAY | RESPIRATORY_TRACT | Status: DC | PRN

## 2010-10-26 NOTE — Telephone Encounter (Signed)
Patient will need to come pick up prescription

## 2010-10-27 LAB — POCT I-STAT 3, ART BLOOD GAS (G3+)
Acid-Base Excess: 4 mmol/L — ABNORMAL HIGH (ref 0.0–2.0)
O2 Saturation: 96 %
TCO2: 30 mmol/L (ref 0–100)

## 2010-10-27 LAB — POCT I-STAT 3, VENOUS BLOOD GAS (G3P V)
pCO2, Ven: 48.4 mmHg (ref 45.0–50.0)
pH, Ven: 7.401 — ABNORMAL HIGH (ref 7.250–7.300)

## 2010-10-27 LAB — GLUCOSE, CAPILLARY: Glucose-Capillary: 235 mg/dL — ABNORMAL HIGH (ref 70–99)

## 2010-10-27 NOTE — Telephone Encounter (Signed)
Need to know where the rx is.

## 2010-11-08 ENCOUNTER — Other Ambulatory Visit: Payer: Self-pay | Admitting: Orthopedic Surgery

## 2010-11-08 DIAGNOSIS — M25561 Pain in right knee: Secondary | ICD-10-CM

## 2010-11-08 DIAGNOSIS — R208 Other disturbances of skin sensation: Secondary | ICD-10-CM

## 2010-11-15 ENCOUNTER — Ambulatory Visit
Admission: RE | Admit: 2010-11-15 | Discharge: 2010-11-15 | Disposition: A | Discharge status: Home / Self Care | Payer: Medicare Other | Source: Ambulatory Visit | Attending: Orthopedic Surgery | Admitting: Orthopedic Surgery

## 2010-11-15 DIAGNOSIS — M25561 Pain in right knee: Secondary | ICD-10-CM

## 2010-11-15 DIAGNOSIS — R208 Other disturbances of skin sensation: Secondary | ICD-10-CM

## 2010-11-23 LAB — COMPREHENSIVE METABOLIC PANEL
Albumin: 4 g/dL (ref 3.5–5.2)
Alkaline Phosphatase: 40 U/L (ref 39–117)
BUN: 20 mg/dL (ref 6–23)
CO2: 29 mEq/L (ref 19–32)
Chloride: 101 mEq/L (ref 96–112)
Creatinine, Ser: 0.95 mg/dL (ref 0.4–1.2)
GFR calc non Af Amer: 60 mL/min — ABNORMAL LOW (ref >60)
Glucose, Bld: 143 mg/dL — ABNORMAL HIGH (ref 70–99)
Potassium: 3.9 mEq/L (ref 3.5–5.1)
Total Bilirubin: 0.3 mg/dL (ref 0.3–1.2)

## 2010-11-23 LAB — URINALYSIS, ROUTINE W REFLEX MICROSCOPIC
Hgb urine dipstick: NEGATIVE
Nitrite: NEGATIVE
Protein, ur: NEGATIVE mg/dL
Urobilinogen, UA: 0.2 mg/dL (ref 0.0–1.0)

## 2010-11-23 LAB — GLUCOSE, CAPILLARY
Glucose-Capillary: 113 mg/dL — ABNORMAL HIGH (ref 70–99)
Glucose-Capillary: 149 mg/dL — ABNORMAL HIGH (ref 70–99)
Glucose-Capillary: 166 mg/dL — ABNORMAL HIGH (ref 70–99)
Glucose-Capillary: 190 mg/dL — ABNORMAL HIGH (ref 70–99)

## 2010-11-23 LAB — CBC
HCT: 39 % (ref 36.0–46.0)
Hemoglobin: 13.1 g/dL (ref 12.0–15.0)
MCV: 86.7 fL (ref 78.0–100.0)
Platelets: 237 10*3/uL (ref 150–400)
RBC: 4.49 MIL/uL (ref 3.87–5.11)
WBC: 5.4 10*3/uL (ref 4.0–10.5)

## 2010-12-10 ENCOUNTER — Other Ambulatory Visit: Payer: Self-pay | Admitting: Family Medicine

## 2010-12-10 NOTE — Telephone Encounter (Signed)
Refill request

## 2010-12-14 ENCOUNTER — Other Ambulatory Visit: Payer: Self-pay | Admitting: Family Medicine

## 2010-12-14 NOTE — Telephone Encounter (Signed)
Refill request

## 2010-12-21 NOTE — Op Note (Signed)
Mercedes Barr, Mercedes Barr               ACCOUNT NO.:  [id number]   MEDICAL RECORD NO.:  [id number]          PATIENT TYPE:  OIB   LOCATION:  3735                         FACILITY:  [facility]   PHYSICIAN:  Darlin Priestly, MD  DATE OF BIRTH:  06-25-1946   DATE OF PROCEDURE:  06/22/2007  DATE OF DISCHARGE:                               OPERATIVE REPORT   PROCEDURES:  1. Explant of a Medtronic Thera DR model [id number], serial [id number],      initially implanted September 10, 1997.  2. Interrogation of atrial and ventricular leads.  3. Pocket modification.  4. Implant of a Medtronic Adapta LADDRL1, serial [id number].   COMPLICATIONS:  None.   INDICATION:  Mercedes Barr is a 65 year old female, patient of Dr. Caprice Kluver, with a history of AV canal defect with subsequent surgery and  development of complete heart block with initial pacer implant in 1991.  She underwent a generator change out in 1999.  She has now reached ERI.  She does have an underlying escape at 91.  She is now referred for  generator change out secondary to ERI.   DESCRIPTION OF OPERATION:  After obtaining informed written consent, the  patient was brought up to the cardiac catheterization laboratory where  her left chest was prepped and draped in a sterile fashion.  ECG  monitoring was established.  Lidocaine 1% used then used to anesthetize  over the previously implanted device.  Next, approximately a 3 cm  transverse incision was carried out over the previous generator and  hemostasis was obtained with electrocautery.  Blunt dissection was used  to carry this down to the left pacer capsule.  The capsule was then  opened with a scalpel and the capsule was freed from the generator.  The  generator and leads were then delivered from pocket.  As previously  stated, the old generator was Medtronic [place] DR model [id number], serial  [id number], initially implanted September 10, 1997.  Atrial led was a  Medtronic model [id number], 45 cm lead,  serial #[id number].  The ventricular  lead was a model 52 cm Medtronic lead model [id number], serial [id number],  both implanted September 10, 1997.  The generator was then removed from  the field and the leads were interrogated.  P waves were measured at 1.7  millivolts.  Threshold in the atrium was 0.7 volts at 0.5 milliseconds.  Impedance 556 ohms.  Current was 1.3 milliamps.  __________ .  Threshold  in the ventricle was 0.6 volts at 0.5 milliseconds.  Impedance 544 ohms.  Current was 1.7 milliamps.   The pocket was then copiously irrigated with 1% kanamycin solution.  Again, hemostasis was confirmed.  The leads were then connected in a  serial fashion to a Medtronic Adapta LADDRL1, serial [id number].  Chassis screws were tightened and pacing was confirmed.  The generator  and leads were then delivered into the pocket and again hemostasis was  confirmed.  Subcutaneous layers were then closed with running 2-0  Vicryl.  The skin was then closed using 4-0 Vicryl.  Steri-Strip was  then applied.  The patient was  returned to the recovery room in stable  condition.   CONCLUSIONS:  1. Successful explant of a Medtronic Thera DR generator, initially      implanted September 10, 1997.  2. Interrogation of the atrial and ventricular leads.  3. Pocket modification.  4. Implant of a Medtronic Adapta LADDRL1 generator, serial      [id number].      Darlin Priestly, MD  Electronically Signed     RHM/MEDQ  D:  06/22/2007  T:  06/22/2007  Job:  [id number]   cc:   Thereasa Solo. Little, M.D.

## 2010-12-21 NOTE — Op Note (Signed)
NAMEQUETZALI, HEINLE NO.:  [id number]   MEDICAL RECORD NO.:  [id number]          PATIENT TYPE:  OIB   LOCATION:  2550                         FACILITY:  [facility]   PHYSICIAN:  Myrtie Neither, MD      DATE OF BIRTH:  [birth year]   DATE OF PROCEDURE:  09/30/2008  DATE OF DISCHARGE:                               OPERATIVE REPORT   PREOPERATIVE DIAGNOSIS:  Internal derangement, right knee.   POSTOPERATIVE DIAGNOSES:  1. Lateral meniscal tear.  2. Chondral defect lateral femoral condyle.  3. Chronic synovitis with patellar plica.   ANESTHESIA:  General.   PROCEDURES:  1. Arthroscopic lateral meniscectomy.  2. Chondroplasty of lateral femoral condyle.  3. Synovectomy with excision of patellar plica and lateral      compartment.   The patient was taken to the operating room after given adequate preop  medications, given general anesthesia, and was intubated.  Right lower  extremity was prepped with DuraPrep and draped in sterile manner.  Tourniquet was used for hemostasis.  One-half inch puncture wound was made along the anterolateral and medial  joint line.  Inflow was through the medial suprapatellar pouch area.  Inspection of the joint revealed large thickened patellar plica with  chronic synovitis changes.  Medial compartment was well preserved.  ACL  was intact.  There was a large tear of the chronic degenerative tear of  the lateral meniscus from posterior to anterior.  This was resected with  use of basket forceps and further smoothed with a meniscal shaver.  Synovectomy with excision of the plica was done with the synovial  shaver.  With the use of a rasp, abrasion chondroplasty was done of the  lateral femoral condyle and further smoothed with the shaver.  Copious  antibiotic irrigation was done.  The patient did not reveal any other  loose fragments.  Wound closure was then done with 4-0 nylon, 14 mL of  0.25% plain Marcaine was injected through the knee.   Compressive  dressing was applied.  The patient tolerated the procedure quite well  and returned to recovery room in stable and satisfactory condition.  The  patient is being kept 23-hour observation, pain control, as well as  observation of her diabetes and her cardiac condition.      Myrtie Neither, MD  Electronically Signed     AC/MEDQ  D:  09/30/2008  T:  09/30/2008  Job:  [id number]

## 2010-12-21 NOTE — Discharge Summary (Signed)
NAMEAFTIN, LYE               ACCOUNT NO.:  [id number]   MEDICAL RECORD NO.:  [id number]          PATIENT TYPE:  OIB   LOCATION:  3732                         FACILITY:  [facility]   PHYSICIAN:  Darlin Priestly, MD  DATE OF BIRTH:  June 23, 1946   DATE OF ADMISSION:  06/22/2007  DATE OF DISCHARGE:  06/23/2007                               DISCHARGE SUMMARY   DISCHARGE DIAGNOSES:  1. 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  Explantation of all pacer, implantation of new Medtronic      Adapta, serial number [id number] by Dr. Lenise Herald.  No lead      changes were done.  2. Status post atrioventricular septal canal defect repair in 1988.  3. Hypertension.  4. Diabetes mellitus.  5. Asthma.  6. Peripheral vascular disease with left carotid artery mild stenosis.   DISCHARGE CONDITION:  Stable.   DISCHARGE MEDICATIONS:  1. Aspirin 81 mg daily.  2. Potassium chloride 20 mEq daily.  3. Glyburide.  4. Metformin 5/500 twice a day.  5. Actos 30 mg daily.  6. Omeprazole 20 mg daily.  7. Furosemide 20 mg daily.  8. Tylenol 650 mg as needed.  9. Multivitamin 1 tablet daily.  10.Mucinex DM as needed.  11.Cozaar 100 mg daily.  12.Pravachol 20 mg daily.  13.Albuterol inhaler as needed.  14.Tylenol No. 3 one or two every 6 hours if needed for pain.   DISCHARGE INSTRUCTIONS:  See pacemaker discharge instructions.  No  driving for 1 week, though she does not drive at all and keep the site  clean and dry until June 29, 2007 and she can shower and then pat it  dry.  Also she can have movement now.  No leads were changed.   HISTORY OF PRESENT ILLNESS:  This is a 65 year old African American  patient of Dr. Clarene Duke was found to have end-of-life characteristics on  her generator on her Medtronic pacemaker.  Patient was electively  planned for generator change.  Her leads were good.  She was brought in  June 22, 2007 for elective generator  change.   Patient had no complaints.  Has done well.  Past medical history:  See  discharge diagnoses.  Family history, social history, review of systems  see H&P.   PHYSICAL EXAMINATION ON DISCHARGE:  VITAL SIGNS:  Blood pressure 97/64,  respirations 16, oxygen saturation on room air 98%, temperature 97.4,  pulse 70.  NEUROLOGIC:  Alert and oriented x3.  EXTREMITIES:  No edema.  CARDIAC:  Pacer site:  A little puffiness at the incision.  No drainage,  though the Steri-Strips have a little old blood on them and this was  painted with Betadine.  LUNGS:  Were clear.   LABORATORY DATA:  No labs were done prior to discharge.  Chest x-ray was  pending.  She is AV pacing and sensing on her EKG.   HOSPITAL COURSE:  Patient brought in electively for generator changeout.  She did well.  New Medtronic pacemaker was placed and by the next  morning she had no  complaints.  She was ready for discharge home.      Darcella Gasman. Valarie Merino      Darlin Priestly, MD  Electronically Signed    LRI/MEDQ  D:  06/23/2007  T:  06/23/2007  Job:  [id number]   cc:   Darlin Priestly, MD  Thereasa Solo Little, M.D.  Altamese Cabal, M.D.

## 2010-12-24 NOTE — Op Note (Signed)
Mercedes Barr, Mercedes Barr               ACCOUNT NO.:  [id number]   MEDICAL RECORD NO.:  [id number]          PATIENT TYPE:  AMB   LOCATION:  ENDO                         FACILITY:  [facility]   PHYSICIAN:  Graylin Shiver, M.D.   DATE OF BIRTH:  08/23/1945   DATE OF PROCEDURE:  09/08/2005  DATE OF DISCHARGE:                                 OPERATIVE REPORT   PROCEDURE:  Colonoscopy with biopsy.   INDICATIONS FOR PROCEDURE:  Screening, family history of colon polyps in her  sister.   Informed consent was obtained after explanation of the risks of bleeding,  infection and perforation.   PREMEDICATION:  Fentanyl 50 mcg IV, Versed 5 mg  IV.   PROCEDURE:  With the patient in the left lateral decubitus position, a  rectal exam was performed. No masses were felt. The Olympus colonoscope was  inserted into the rectum and advanced around the colon to the cecum. Cecal  landmarks were identified. The terminal ileum was intubated and the first  few centimeters of terminal ileum looked normal the cecum looked normal. The  ascending colon looked normal. The transverse colon looked normal.  In the  mid descending colon there was a small 3 mm polyp biopsied off with cold  forceps. The sigmoid and rectum looked normal. She tolerated the procedure  well without complications.   IMPRESSION:  A small descending colon polyp.   PLAN:  The pathology will be checked.   I would recommend a follow-up colonoscopy again five years.           ______________________________  Graylin Shiver, M.D.     SFG/MEDQ  D:  09/08/2005  T:  09/08/2005  Job:  [id number]   cc:   Sibyl Parr. Darrick Penna, M.D.  Fax: [phone]

## 2010-12-29 ENCOUNTER — Other Ambulatory Visit: Payer: Self-pay | Admitting: Family Medicine

## 2010-12-29 NOTE — Telephone Encounter (Signed)
Refill request

## 2010-12-31 ENCOUNTER — Other Ambulatory Visit: Payer: Self-pay | Admitting: Family Medicine

## 2011-01-03 NOTE — Telephone Encounter (Signed)
Refill request

## 2011-01-05 ENCOUNTER — Other Ambulatory Visit: Payer: Self-pay | Admitting: Family Medicine

## 2011-01-06 NOTE — Telephone Encounter (Signed)
Refill request

## 2011-01-07 ENCOUNTER — Inpatient Hospital Stay (INDEPENDENT_AMBULATORY_CARE_PROVIDER_SITE_OTHER)
Admission: RE | Admit: 2011-01-07 | Discharge: 2011-01-07 | Disposition: A | Discharge status: Home / Self Care | Payer: Medicare Other | Source: Ambulatory Visit | Attending: Family Medicine | Admitting: Family Medicine

## 2011-01-07 ENCOUNTER — Ambulatory Visit (INDEPENDENT_AMBULATORY_CARE_PROVIDER_SITE_OTHER): Payer: Medicare Other

## 2011-01-07 DIAGNOSIS — J069 Acute upper respiratory infection, unspecified: Secondary | ICD-10-CM

## 2011-05-17 ENCOUNTER — Other Ambulatory Visit: Payer: Self-pay | Admitting: Family Medicine

## 2011-05-17 MED ORDER — POTASSIUM CHLORIDE 20 MEQ PO PACK: 20.0000 meq | PACK | Freq: Every day | ORAL | Status: DC

## 2011-05-17 NOTE — Telephone Encounter (Signed)
Reference # [id number] - checking on refill fax sent on 10/4 for Potassium Chloride.

## 2011-05-17 NOTE — Telephone Encounter (Signed)
Refill sent electronically

## 2011-05-19 LAB — POCT URINALYSIS DIP (DEVICE)
Bilirubin Urine: NEGATIVE
Glucose, UA: 250 — AB
Specific Gravity, Urine: 1.025

## 2011-05-26 ENCOUNTER — Other Ambulatory Visit: Payer: Self-pay | Admitting: Family Medicine

## 2011-05-26 MED ORDER — POTASSIUM CHLORIDE CRYS ER 20 MEQ PO TBCR: 20.0000 meq | EXTENDED_RELEASE_TABLET | Freq: Every day | ORAL | Status: DC

## 2011-05-30 ENCOUNTER — Other Ambulatory Visit: Payer: Self-pay | Admitting: Family Medicine

## 2011-05-30 MED ORDER — ALBUTEROL 90 MCG/ACT IN AERS: 2.0000 | INHALATION_SPRAY | RESPIRATORY_TRACT | Status: DC | PRN

## 2011-06-02 ENCOUNTER — Ambulatory Visit: Payer: Medicare Other | Admitting: Family Medicine

## 2011-06-14 ENCOUNTER — Telehealth: Payer: Self-pay | Admitting: Family Medicine

## 2011-06-14 MED ORDER — CLONAZEPAM 0.5 MG PO TABS: 0.5000 mg | ORAL_TABLET | Freq: Two times a day (BID) | ORAL | Status: DC | PRN

## 2011-06-14 NOTE — Telephone Encounter (Signed)
RX called to pharmacy and patient notified. She has appointment with PCP next week.

## 2011-06-14 NOTE — Telephone Encounter (Signed)
Chart reviewed.  Pt has h/o of taking clonazepam in past (last fill 2010) for short course.  Given the death of her granddaughter today, will prescribe brief course of clonazepam.  Pt to follow up with Dr. Lula Olszewski when available.

## 2011-06-14 NOTE — Telephone Encounter (Signed)
Pt called to say her granddaughter died this morning and she is wanting something for her nerves - wants to know if she has to come in. [facility] [address]

## 2011-06-20 ENCOUNTER — Ambulatory Visit: Payer: Medicare Other | Admitting: Family Medicine

## 2011-07-13 ENCOUNTER — Emergency Department (INDEPENDENT_AMBULATORY_CARE_PROVIDER_SITE_OTHER)
Admission: EM | Admit: 2011-07-13 | Discharge: 2011-07-13 | Disposition: A | Discharge status: Home / Self Care | Payer: Medicare Other | Source: Home / Self Care | Attending: Family Medicine | Admitting: Family Medicine

## 2011-07-13 ENCOUNTER — Encounter (HOSPITAL_COMMUNITY): Payer: Self-pay | Admitting: Emergency Medicine

## 2011-07-13 DIAGNOSIS — J069 Acute upper respiratory infection, unspecified: Secondary | ICD-10-CM

## 2011-07-13 HISTORY — DX: Essential (primary) hypertension: I10

## 2011-07-13 HISTORY — DX: Atherosclerotic heart disease of native coronary artery without angina pectoris: I25.10

## 2011-07-13 MED ORDER — GUAIFENESIN-CODEINE 100-10 MG/5ML PO SYRP: 5.0000 mL | ORAL_SOLUTION | Freq: Four times a day (QID) | ORAL | Status: AC | PRN

## 2011-07-13 NOTE — ED Notes (Signed)
Pt here with flu like sx that started on Saturday.sx started with sore throat then worsened with body aches,chills,cough with greenish phlegm and temp last night 100.2 relieved by tylenol.sob at rest with resp 32 on admit.hx asthma.pt has been taking inhaler.sats 100% r/a

## 2011-07-13 NOTE — ED Provider Notes (Signed)
History     CSN: [id number] Arrival date & time: 07/13/2011 10:42 AM   First MD Initiated Contact with Patient 07/13/11 1054      Chief Complaint  Patient presents with  . Influenza    (Consider location/radiation/quality/duration/timing/severity/associated sxs/prior treatment) HPI Comments: Mercedes Barr presents for evaluation of fever, chills, cough, sore throat, and body aches. Mercedes Barr denies any sick contacts.   Patient is a 65 y.o. female presenting with URI. The history is provided by the patient.  URI The primary symptoms include fever, fatigue, sore throat, cough and myalgias. Primary symptoms do not include ear pain, wheezing, abdominal pain, nausea or vomiting. The current episode started 3 to 5 days ago. This is a new problem. The problem has not changed since onset. The fever began 3 to 5 days ago. The fever has been unchanged since its onset. The maximum temperature recorded prior to her arrival was 100 to 100.9 F.  The sore throat is not accompanied by trouble swallowing.  The cough began 3 to 5 days ago. The cough is new. The cough is non-productive.  Symptoms associated with the illness include chills, congestion and rhinorrhea.    Past Medical History  Diagnosis Date  . Diabetes mellitus   . Hypertension   . Coronary artery disease     CABG 1988 S/P PACEMAKER    Past Surgical History  Procedure Date  . Coronary artery bypass graft 1988  . Pacemaker insertion 1988    Family History  Problem Relation Age of Onset  . Heart failure Mother   . Heart failure Sister     History  Substance Use Topics  . Smoking status: Never Smoker   . Smokeless tobacco: Not on file  . Alcohol Use: No    OB History    Grav Para Term Preterm Abortions TAB SAB Ect Mult Living                  Review of Systems  Constitutional: Positive for fever, chills and fatigue.  HENT: Positive for congestion, sore throat and rhinorrhea. Negative for ear pain and trouble swallowing.     Eyes: Negative.   Respiratory: Positive for cough. Negative for shortness of breath and wheezing.   Cardiovascular: Negative.   Gastrointestinal: Negative.  Negative for nausea, vomiting and abdominal pain.  Genitourinary: Negative.   Musculoskeletal: Positive for myalgias.  Skin: Negative.   Neurological: Negative.     Allergies  Amitriptyline hcl; Benazepril hcl; Naproxen; Oxycodone-acetaminophen; Propoxyphene n-acetaminophen; and Statins  Home Medications   Current Outpatient Rx  Name Route Sig Dispense Refill  . ALBUTEROL SULFATE (2.5 MG/3ML) 0.083% IN NEBU Nebulization Take 2.5 mg by nebulization 4 (four) times daily. As Needed.     . ALBUTEROL 90 MCG/ACT IN AERS Inhalation Inhale 2 puffs into the lungs every 4 (four) hours as needed for wheezing or shortness of breath. 17 g 5  . ASPIRIN 81 MG PO TABS Oral Take 81 mg by mouth daily.      Marland Kitchen CALCIUM CARBONATE-VITAMIN D 500-200 MG-UNIT PO TABS Oral Take 2 tablets by mouth daily.      Marland Kitchen CLONAZEPAM 0.5 MG PO TABS Oral Take 1 tablet (0.5 mg total) by mouth 2 (two) times daily as needed for anxiety. 20 tablet 0  . FLUOXETINE HCL 20 MG PO TABS Oral Take 20 mg by mouth daily.      Marland Kitchen FLUTICASONE PROPIONATE  HFA 110 MCG/ACT IN AERO Inhalation Inhale 2 puffs into the lungs 2 (two) times daily.      Marland Kitchen  FUROSEMIDE 20 MG PO TABS  TAKE 1 TABLET BY MOUTH DAILY 90 tablet 2  . GLYBURIDE-METFORMIN 5-500 MG PO TABS  TAKE 2 TABLETS BY MOUTH TWICE DAILY 360 tablet 2  . HYDROCHLOROTHIAZIDE 12.5 MG PO CAPS  TAKE 1 CAPSULE BY MOUTH DAILY 90 capsule 2  . JANUVIA 100 MG PO TABS  TAKE 1 TABLET DAILY 90 tablet 2  . LOSARTAN POTASSIUM 50 MG PO TABS  TAKE 1 TABLET BY MOUTH DAILY 90 tablet 2  . OMEPRAZOLE 40 MG PO CPDR Oral Take 40 mg by mouth daily.      Marland Kitchen POTASSIUM CHLORIDE 20 MEQ PO PACK Oral Take 20 mEq by mouth daily. 30 tablet 11  . POTASSIUM CHLORIDE CRYS CR 20 MEQ PO TBCR Oral Take 1 tablet (20 mEq total) by mouth daily. 90 tablet 3  . PROPRANOLOL HCL  40 MG PO TABS  TAKE 1 TABLET BY MOUTH TWICE DAILY 180 tablet 2  . TRIAMCINOLONE ACETONIDE 0.5 % EX CREA Topical Apply topically. Apply to itchy areas two times a day until 2-3 days after healed.     Marland Kitchen ZOLPIDEM TARTRATE 10 MG PO TABS Oral Take 10 mg by mouth at bedtime as needed.        BP 113/61  Pulse 77  Temp(Src) 98.5 F (36.9 C) (Oral)  Resp 32  SpO2 100%  Physical Exam  Nursing note and vitals reviewed. Constitutional: Mercedes Barr is oriented to person, place, and time. Mercedes Barr appears well-developed and well-nourished.  HENT:  Head: Normocephalic and atraumatic.  Right Ear: Tympanic membrane and external ear normal.  Left Ear: Tympanic membrane and external ear normal.  Nose: Nose normal.  Mouth/Throat: Uvula is midline, oropharynx is clear and moist and mucous membranes are normal.  Eyes: EOM are normal. Pupils are equal, round, and reactive to light.  Neck: Normal range of motion.  Cardiovascular: Normal rate and regular rhythm.   Pulmonary/Chest: Effort normal and breath sounds normal.  Neurological: Mercedes Barr is alert and oriented to person, place, and time.  Skin: Skin is warm and dry.    ED Course  Procedures (including critical care time)  Labs Reviewed - No data to display No results found.   No diagnosis found.    MDM          Richardo Priest, MD 07/13/11 1136

## 2011-08-10 ENCOUNTER — Other Ambulatory Visit: Payer: Self-pay | Admitting: Family Medicine

## 2011-08-11 NOTE — Telephone Encounter (Signed)
Refill request

## 2011-08-16 ENCOUNTER — Other Ambulatory Visit: Payer: Self-pay | Admitting: Family Medicine

## 2011-08-16 NOTE — Telephone Encounter (Signed)
Refill request

## 2011-08-17 ENCOUNTER — Other Ambulatory Visit: Payer: Self-pay | Admitting: Family Medicine

## 2011-08-17 NOTE — Telephone Encounter (Signed)
Refill request

## 2011-08-23 ENCOUNTER — Ambulatory Visit: Payer: Medicare Other | Admitting: Family Medicine

## 2011-08-26 ENCOUNTER — Ambulatory Visit: Payer: Medicare Other | Admitting: Family Medicine

## 2011-08-30 ENCOUNTER — Ambulatory Visit: Payer: Medicare Other | Admitting: Family Medicine

## 2011-09-01 ENCOUNTER — Other Ambulatory Visit: Payer: Self-pay | Admitting: Family Medicine

## 2011-09-01 NOTE — Telephone Encounter (Signed)
Refill request

## 2011-09-06 ENCOUNTER — Other Ambulatory Visit: Payer: Self-pay | Admitting: Family Medicine

## 2011-09-06 NOTE — Telephone Encounter (Signed)
Refill request

## 2011-09-07 ENCOUNTER — Ambulatory Visit (INDEPENDENT_AMBULATORY_CARE_PROVIDER_SITE_OTHER): Payer: Medicare Other | Admitting: Family Medicine

## 2011-09-07 ENCOUNTER — Encounter: Payer: Self-pay | Admitting: Family Medicine

## 2011-09-07 VITALS — BP 105/64 | HR 92 | Temp 97.4°F | Ht 62.5 in | Wt 145.2 lb

## 2011-09-07 DIAGNOSIS — F3289 Other specified depressive episodes: Secondary | ICD-10-CM

## 2011-09-07 DIAGNOSIS — F329 Major depressive disorder, single episode, unspecified: Secondary | ICD-10-CM

## 2011-09-07 DIAGNOSIS — E079 Disorder of thyroid, unspecified: Secondary | ICD-10-CM

## 2011-09-07 DIAGNOSIS — M545 Low back pain, unspecified: Secondary | ICD-10-CM

## 2011-09-07 DIAGNOSIS — J449 Chronic obstructive pulmonary disease, unspecified: Secondary | ICD-10-CM

## 2011-09-07 DIAGNOSIS — E119 Type 2 diabetes mellitus without complications: Secondary | ICD-10-CM

## 2011-09-07 DIAGNOSIS — I10 Essential (primary) hypertension: Secondary | ICD-10-CM

## 2011-09-07 DIAGNOSIS — J4489 Other specified chronic obstructive pulmonary disease: Secondary | ICD-10-CM

## 2011-09-07 DIAGNOSIS — Z23 Encounter for immunization: Secondary | ICD-10-CM

## 2011-09-07 DIAGNOSIS — E785 Hyperlipidemia, unspecified: Secondary | ICD-10-CM

## 2011-09-07 HISTORY — DX: Low back pain, unspecified: M54.50

## 2011-09-07 LAB — BASIC METABOLIC PANEL
BUN: 17 mg/dL (ref 6–23)
CO2: 29 mEq/L (ref 19–32)
Chloride: 103 mEq/L (ref 96–112)
Glucose, Bld: 113 mg/dL — ABNORMAL HIGH (ref 70–99)
Potassium: 3.8 mEq/L (ref 3.5–5.3)
Sodium: 143 mEq/L (ref 135–145)

## 2011-09-07 LAB — TSH: TSH: 6.012 u[IU]/mL — ABNORMAL HIGH (ref 0.350–4.500)

## 2011-09-07 LAB — LIPID PANEL
HDL: 33 mg/dL — ABNORMAL LOW (ref >39)
Total CHOL/HDL Ratio: 3.5 Ratio
Triglycerides: 130 mg/dL (ref <150)

## 2011-09-07 MED ORDER — CYCLOBENZAPRINE HCL 10 MG PO TABS: 10.0000 mg | ORAL_TABLET | Freq: Every evening | ORAL | Status: DC | PRN

## 2011-09-07 MED ORDER — FLUOXETINE HCL 20 MG PO TABS: 40.0000 mg | ORAL_TABLET | Freq: Every day | ORAL | Status: DC

## 2011-09-07 MED ORDER — NAPROXEN 500 MG PO TABS: 500.0000 mg | ORAL_TABLET | Freq: Two times a day (BID) | ORAL | Status: DC

## 2011-09-07 MED ORDER — FLUTICASONE PROPIONATE HFA 110 MCG/ACT IN AERO: 2.0000 | INHALATION_SPRAY | Freq: Two times a day (BID) | RESPIRATORY_TRACT | Status: DC

## 2011-09-07 MED ORDER — CYCLOBENZAPRINE HCL 10 MG PO TABS: 10.0000 mg | ORAL_TABLET | Freq: Every evening | ORAL | Status: AC | PRN

## 2011-09-07 NOTE — Assessment & Plan Note (Signed)
Well-controlled on current regimen. No changes today. 

## 2011-09-07 NOTE — Patient Instructions (Addendum)
It was nice to meet you.  Please have your mammogram done.  Also, please make an appointment in about 6 months for your well woman exam and pap smear.   Please start taking Flovent twice a day to control your asthma better.   Please increase your prozac to two tablets daily, this should help with your anxiety and sadness, and sleep.   For your back- please see the attached exercises, do them twice daily and keep walking.  You should take naproxen, one tab twice a day for one week, then stop.  You can use flexeril at bedtime, and continue using tylenol as needed.   Please ask your Cardiologist if you need to be on a beta blocker- you have propranolol (Inderal) on your medication list, but you do not think you are taking that.

## 2011-09-07 NOTE — Assessment & Plan Note (Signed)
Poorly controlled, will re-start Flovent, did asthma teaching.

## 2011-09-07 NOTE — Assessment & Plan Note (Signed)
Overdue for labs, will check today.

## 2011-09-07 NOTE — Progress Notes (Signed)
Addended by: Garen Grams F on: 09/07/2011 09:57 AM   Modules accepted: Orders

## 2011-09-07 NOTE — Assessment & Plan Note (Signed)
Discussed course of back pain, gave one week of naproxen (want to limit due to comorbidity), flexeril.  Gave pt hand out with home exercises.  Advised staying active (walking) but avoiding things that make it hurt.

## 2011-09-07 NOTE — Assessment & Plan Note (Signed)
At goal, continue current medications

## 2011-09-07 NOTE — Assessment & Plan Note (Signed)
Pt with significant symptoms, will increase prozac to 40 po daily.

## 2011-09-07 NOTE — Progress Notes (Signed)
  Subjective:    Patient ID: Mercedes Barr, female    DOB: Jan 29, 1946, 66 y.o.   MRN: [id number]  HPI  Mercedes Barr comes in for back pain and follow up.    Back pain- started 3 weeks ago, no injury.  Her back hurts in the low back, hurts to vacuume and mop, do regular household chores.  She has taken some tylenol for the pain.  She sometimes has pains shoot down the back of her left leg to the knee.  No weakness, numbness, incontinence.   DM- pt says well controlled, compliance with medications.  Fasting glucose ranges from 100-160's.  She has tried to eat a healthy diet but her husband has been very ill and this has made it hard.   Depression- Pt says that her mother died, as well as a 74 year old niece within the past year.  This along with her husbands illness has caused her a lot of anxiety, and sometimes she is tearful.  She is still able to do daily activities, but some days finds it hard.  She is taking prozac as prescribed.   HTN- pt states she is taking HCTZ and losartan without difficulty.  She has propranolol on her list but does not think she has ever taken that.  No chest pain, no shortness of breath, palpitations.   HLD- allergic to statins, but has not had lipids in more than a year.    COPD- pt is not taking flovent, but says sometimes she has to use her albuterol nebulizer three times a day, and sometimes has coughing at night time.    Review of Systems Pertinent items in HPI.    Objective:   Physical Exam BP 105/64  Pulse 92  Temp(Src) 97.4 F (36.3 C) (Oral)  Ht 5' 2.5" (1.588 m)  Wt 145 lb 3.2 oz (65.862 kg)  BMI 26.13 kg/m2 General appearance: alert, cooperative and no distress Neck: no adenopathy, no JVD, supple, symmetrical, trachea midline and thyroid not enlarged, symmetric, no tenderness/mass/nodules Back: symmetric, no curvature. ROM normal. No CVA tenderness., + muscle spasm in lumbar area.  Negative straight leg raise Lungs: clear to auscultation  bilaterally Heart: regular rate and rhythm, S1, S2 normal, no murmur, click, rub or gallop Abdomen: soft, non-tender; bowel sounds normal; no masses,  no organomegaly Extremities: extremities normal, atraumatic, no cyanosis or edema Pulses: 2+ and symmetric Neurologic: Grossly normal, LE reflexes 2+ and symmetric, LE strength 5/5 bilaterally.        Assessment & Plan:

## 2011-09-07 NOTE — Assessment & Plan Note (Signed)
Recheck TSH 

## 2011-09-08 ENCOUNTER — Other Ambulatory Visit: Payer: Self-pay | Admitting: Family Medicine

## 2011-09-09 NOTE — Telephone Encounter (Signed)
Refill request

## 2011-09-19 ENCOUNTER — Encounter: Payer: Self-pay | Admitting: Family Medicine

## 2011-09-19 ENCOUNTER — Telehealth: Payer: Self-pay | Admitting: Family Medicine

## 2011-09-19 MED ORDER — LEVOTHYROXINE SODIUM 75 MCG PO TABS: 75.0000 ug | ORAL_TABLET | Freq: Every day | ORAL | Status: DC

## 2011-09-19 NOTE — Telephone Encounter (Signed)
Left message that TSH was elevated, feel she should start taking synthroid, and that she should take it once a day.  Rx will be sent to her pharmacy.  She should come back in about 3 months for follow up.

## 2011-09-20 ENCOUNTER — Other Ambulatory Visit: Payer: Self-pay | Admitting: *Deleted

## 2011-09-20 DIAGNOSIS — E039 Hypothyroidism, unspecified: Secondary | ICD-10-CM

## 2011-09-20 MED ORDER — LEVOTHYROXINE SODIUM 75 MCG PO TABS: 75.0000 ug | ORAL_TABLET | Freq: Every day | ORAL | Status: DC

## 2011-09-20 NOTE — Telephone Encounter (Signed)
Patient calls stating her med for thyroid was sent to  the wrong pharmacy and she wants [facility] [address]  Rx resent  for levothyroxine.

## 2011-09-20 NOTE — Telephone Encounter (Signed)
[facility]  and cancelled RX. Patient notified.

## 2011-09-29 ENCOUNTER — Telehealth: Payer: Self-pay | Admitting: Family Medicine

## 2011-09-29 NOTE — Telephone Encounter (Signed)
Patient has been taking Omepresole 2x daily and she is now out - she has been thinking that she was supposed to be taking 2 since she has so much acid reflux.  She is asking for [place] to call [facility] to increase her meds.  [employer] - [phone]

## 2011-09-29 NOTE — Telephone Encounter (Signed)
Returned call to patient.  States she has been taking omeprazole BID and it is helping "tremendously."  Wants to know if Dr. Lula Olszewski can send new Rx for #60/month.  Will route note to PCP and call patient back.   Gaylene Brooks, RN

## 2011-10-04 ENCOUNTER — Ambulatory Visit (INDEPENDENT_AMBULATORY_CARE_PROVIDER_SITE_OTHER): Payer: Medicare Other | Admitting: Family Medicine

## 2011-10-04 VITALS — BP 118/77 | HR 90 | Temp 98.2°F | Resp 18

## 2011-10-04 DIAGNOSIS — T6391XA Toxic effect of contact with unspecified venomous animal, accidental (unintentional), initial encounter: Secondary | ICD-10-CM

## 2011-10-04 DIAGNOSIS — Z91030 Bee allergy status: Secondary | ICD-10-CM

## 2011-10-04 DIAGNOSIS — T63461A Toxic effect of venom of wasps, accidental (unintentional), initial encounter: Secondary | ICD-10-CM

## 2011-10-04 MED ORDER — EPINEPHRINE 0.3 MG/0.3ML IJ DEVI: 0.3000 mg | Freq: Once | INTRAMUSCULAR | Status: DC

## 2011-10-04 NOTE — Patient Instructions (Signed)
Bee, Wasp, or Hornet Sting Your caregiver has diagnosed you as having an insect sting. An insect sting appears as a red lump in the skin that sometimes has a tiny hole in the center, or it may have a stinger in the center of the wound. The most common stings are from wasps, hornets and bees. Individuals have different reactions to insect stings.  A normal reaction may cause pain, swelling, and redness around the sting site.   A localized allergic reaction may cause swelling and redness that extends beyond the sting site.   A large local reaction may continue to develop over the next 12 to 36 hours.   On occasion, the reactions can be severe (anaphylactic reaction). An anaphylactic reaction may cause wheezing; difficulty breathing; chest pain; fainting; raised, itchy, red patches on the skin; a sick feeling to your stomach (nausea); vomiting; cramping; or diarrhea. If you have had an anaphylactic reaction to an insect sting in the past, you are more likely to have one again.  HOME CARE INSTRUCTIONS   With bee stings, a small sac of poison is left in the wound. Brushing across this with something such as a credit card, or anything similar, will help remove this and decrease the amount of the reaction. This same procedure will not help a wasp sting as they do not leave behind a stinger and poison sac.   Apply a cold compress for 10 to 20 minutes every hour for 1 to 2 days, depending on severity, to reduce swelling and itching.   To lessen pain, a paste made of water and baking soda may be rubbed on the bite or sting and left on for 5 minutes.   To relieve itching and swelling, you may use take medication or apply medicated creams or lotions as directed.   Only take over-the-counter or prescription medicines for pain, discomfort, or fever as directed by your caregiver.   Wash the sting site daily with soap and water. Apply antibiotic ointment on the sting site as directed.   If you suffered a  severe reaction:   If you did not require hospitalization, an adult will need to stay with you for 24 hours in case the symptoms return.   You may need to wear a medical bracelet or necklace stating the allergy.   You and your family need to learn when and how to use an anaphylaxis kit or epinephrine injection.   If you have had a severe reaction before, always carry your anaphylaxis kit with you.  SEEK MEDICAL CARE IF:   None of the above helps within 2 to 3 days.   The area becomes red, warm, tender, and swollen beyond the area of the bite or sting.   You have an oral temperature above 102 F (38.9 C).  SEEK IMMEDIATE MEDICAL CARE IF:  You have symptoms of an allergic reaction which are:  Wheezing.   Difficulty breathing.   Chest pain.   Lightheadedness or fainting.   Itchy, raised, red patches on the skin.   Nausea, vomiting, cramping or diarrhea.  ANY OF THESE SYMPTOMS MAY REPRESENT A SERIOUS PROBLEM THAT IS AN EMERGENCY. Do not wait to see if the symptoms will go away. Get medical help right away. Call your local emergency services (911 in [place] DO NOT drive yourself to the hospital. MAKE SURE YOU:   Understand these instructions.   Will watch your condition.   Will get help right away if you are not doing well or  get worse.  Document Released: 07/25/2005 Document Revised: 04/06/2011 Document Reviewed: 01/09/2010 [facility] Patient Information 2012 [place], [place].

## 2011-10-04 NOTE — Telephone Encounter (Signed)
No, the maximum recommended dose of omeprazole is 40 mg once a day.  She could try taking 20 mg twice a day, but should not take 40 mg twice a day.    Please let her know.

## 2011-10-04 NOTE — Telephone Encounter (Signed)
Called back to patient.  Left message to call our office back.  Gaylene Brooks, RN

## 2011-10-06 NOTE — Progress Notes (Signed)
  Subjective:    Patient ID: Mercedes Barr, female    DOB: 05-Nov-1945, 66 y.o.   MRN: [id number]  HPI  Ms. 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  She says that last Saturday, she was gardening with a friend and she was stung by a bee on her left thigh. .  She says that she thinks she may have been stung once before when she was a teenager.  She says she started wheezing and feeling chest tightness, and felt like her tongue and mouth were swollen.  She says her friend's son has allergies, so her friend used his epi pen, and she felt better in about 15 minutes.    She now denies any continued symptoms, and says she was able to get the stinger out.   Review of Systems Pertinent items in HPI.     Objective:   Physical Exam BP 118/77  Pulse 90  Temp 98.2 F (36.8 C)  Resp 18 General appearance: alert, cooperative and no distress Nose: Nares normal. Septum midline. Mucosa normal. No drainage or sinus tenderness. Throat: lips, mucosa, and tongue normal; teeth and gums normal Neck: no adenopathy, supple, symmetrical, trachea midline and thyroid not enlarged, symmetric, no tenderness/mass/nodules Lungs: clear to auscultation bilaterally Heart: regular rate and rhythm, S1, S2 normal, no murmur, click, rub or gallop Extremities: extremities normal, atraumatic, no cyanosis or edema and Small spot where sting was in medial left thigh, no sing of embedded singer or continued reaction. Pulses: 2+ and symmetric       Assessment & Plan:

## 2011-10-06 NOTE — Assessment & Plan Note (Signed)
Concern this may be an anaphylactic reaction.  Rx epi pen, and pt instructed how to use it.

## 2011-10-11 NOTE — Telephone Encounter (Signed)
Called back to patient.  Left message to call our office back.  Inger Wiest Ann, RN  

## 2011-10-14 ENCOUNTER — Telehealth: Payer: Self-pay | Admitting: Family Medicine

## 2011-10-14 MED ORDER — OMEPRAZOLE 20 MG PO CPDR: 20.0000 mg | DELAYED_RELEASE_CAPSULE | Freq: Two times a day (BID) | ORAL | Status: DC

## 2011-10-14 NOTE — Telephone Encounter (Signed)
Patient is out of Prilosec, was waiting for the new Rx for 20 mg twice per day to go to her pharmacy.

## 2011-10-21 ENCOUNTER — Other Ambulatory Visit: Payer: Self-pay | Admitting: Family Medicine

## 2011-10-21 NOTE — Telephone Encounter (Signed)
Refill request

## 2011-10-24 ENCOUNTER — Other Ambulatory Visit: Payer: Self-pay | Admitting: Family Medicine

## 2011-10-24 NOTE — Telephone Encounter (Signed)
Refill request

## 2011-10-25 NOTE — Telephone Encounter (Signed)
Rx sent to pharmacy   

## 2011-10-27 ENCOUNTER — Other Ambulatory Visit: Payer: Self-pay | Admitting: Family Medicine

## 2011-10-27 MED ORDER — OMEPRAZOLE 20 MG PO CPDR: 20.0000 mg | DELAYED_RELEASE_CAPSULE | Freq: Two times a day (BID) | ORAL | Status: DC

## 2011-11-08 ENCOUNTER — Other Ambulatory Visit: Payer: Self-pay | Admitting: *Deleted

## 2011-11-08 NOTE — Telephone Encounter (Signed)
I have gotten multiple faxes from [facility], and sent them back with notes that neither of these medications are supposed to be long term medicines.  They were short term medications for knee and pain (one was for one week and one was for one month).    They are not authorized to be refilled.  Please notify [facility].   Mercedes Barr 11/08/2011 7:20 PM

## 2011-11-08 NOTE — Telephone Encounter (Signed)
[facility] requesting refill of patient's medications: 1.  Naproxen 500 mg (ref. # [id number])  2.  Cyclobenzaprine 10 mg (ref. # [id number]) Will route refill request to Dr. Lula Olszewski.  Gaylene Brooks, RN

## 2011-11-09 NOTE — Telephone Encounter (Signed)
Returned call to [facility] and informed that refill requests have been denied.  Patient will need office visit for reevaluation.  Gaylene Brooks, RN

## 2011-11-16 ENCOUNTER — Other Ambulatory Visit: Payer: Self-pay | Admitting: Family Medicine

## 2011-12-21 ENCOUNTER — Other Ambulatory Visit: Payer: Self-pay | Admitting: Family Medicine

## 2011-12-21 DIAGNOSIS — M79605 Pain in left leg: Secondary | ICD-10-CM

## 2011-12-21 MED ORDER — NAPROXEN 500 MG PO TABS: 500.0000 mg | ORAL_TABLET | Freq: Two times a day (BID) | ORAL | Status: DC

## 2011-12-21 MED ORDER — PROPRANOLOL HCL 40 MG PO TABS: 40.0000 mg | ORAL_TABLET | Freq: Two times a day (BID) | ORAL | Status: DC

## 2012-01-24 ENCOUNTER — Encounter (HOSPITAL_COMMUNITY): Payer: Self-pay

## 2012-01-24 ENCOUNTER — Emergency Department (INDEPENDENT_AMBULATORY_CARE_PROVIDER_SITE_OTHER)
Admission: EM | Admit: 2012-01-24 | Discharge: 2012-01-24 | Disposition: A | Discharge status: Home / Self Care | Payer: Medicare Other | Source: Home / Self Care | Attending: Emergency Medicine | Admitting: Emergency Medicine

## 2012-01-24 DIAGNOSIS — M25561 Pain in right knee: Secondary | ICD-10-CM

## 2012-01-24 DIAGNOSIS — M25569 Pain in unspecified knee: Secondary | ICD-10-CM

## 2012-01-24 MED ORDER — TRAMADOL HCL 50 MG PO TABS: 50.0000 mg | ORAL_TABLET | Freq: Four times a day (QID) | ORAL | Status: AC | PRN

## 2012-01-24 NOTE — ED Provider Notes (Signed)
Medical screening examination/treatment/procedure(s) were performed by non-physician practitioner and as supervising physician I was immediately available for consultation/collaboration.  Leslee Home, M.D.   Reuben Likes, MD 01/24/12 2059

## 2012-01-24 NOTE — ED Notes (Signed)
States she has been having pain and swelling  in her right knee past 2-3 weeks; no known cause of pain ; has had a " scraping of her knee" several yrs ago by Dr Myrtie Neither , using OTC pain medication w minimal relief

## 2012-01-24 NOTE — Discharge Instructions (Signed)
Patella Problems (Patellofemoral Syndrome) This syndrome is caused by changes in the undersurface of the kneecap (patella). The changes vary from minor inflammation to major changes such as breakdown of the cartilage on the undersurface of the patella. The major changes can be seen with an arthroscope (a small, pencil-sized telescope). These changes can result from various factors. These factors may arise from abnormal tracking (movement or malalignment) of the patella. Normally the Patella is in its normal groove located between the condyles (grooved end) of the femur (thigh bone). Abnormal movement leads to increased pressure in the patellofemoral joint. This leads to swelling in the cartilage, inflammation and pain. SYMPTOMS   The patient with this syndrome usually has an ache in the knee. It is often aggravated by:  Prolonged sitting.   Squatting.   Climbing stairs.   Running down hill.   Other exercising that stresses the knee.  Other findings may include the knee giving way, swelling, and or locking. TREATMENT   The treatment will depend on the cause of the problem. Sometimes the solution is as simple as cutting down on activities. Giving your joint a rest with the use of crutches and braces can also help. This is generally followed by strengthening exercises. RECOVERY Recovery from a patellar problem depends on the type of problem in your knee and on the treatment required. If conservative treatment works the recovery period may be as little as three to four weeks. If more aggressive therapy such as surgery is required, the recovery period may be several months. Your caregiver will discuss this with you. HOME CARE INSTRUCTIONS  Following exercise, use an ice pack for twenty to thirty minutes three to four times per day. Use a towel between your ice pack and the skin.   Reduction of inflammation with anti-inflammatories may be helpful. Only take over-the-counter or prescription medicines  for pain, discomfort, or fever as directed by your caregiver.   Taping the knee or using a neoprene sleeve with a patellar cutout to provide better tracking of the patella may give relief.   Muscle (quadriceps) strengthening exercises are helpful. Follow your caregiver's advice.   Muscle stretching prior to exercise may be helpful.   Soft tissue therapy using ultrasound, and diathermy may be helpful.   If conservative therapy is not effective, surgery may provide relief. During arthroscopy, your caregiver may discover a rough surface beneath your kneecap. If this happens, your caregiver may smooth this out by shaving the surface.  SEEK MEDICAL CARE IF: If you have surgery, see your caregiver if:  There is increased bleeding or clear fluid (more than a small spot) from the wound.   You notice redness, swelling, or increasing pain in the wound.   Pus is coming from wound.   You develop an unexplained oral temperature above 102 F (38.9 C) develops, or as your caregiver suggests.   You notice a foul smell coming from the wound or dressing.   You develop increasing pain or stiffness in your knee.  SEEK IMMEDIATE MEDICAL CARE IF:    You develop a rash.   You have difficulty breathing.   You have any allergic problems.  MAKE SURE YOU:    Understand these instructions.   Will watch your condition.   Will get help right away if you are not doing well or get worse.  Document Released: 07/22/2000 Document Revised: 07/14/2011 Document Reviewed: 08/11/2008 ExitCare Patient Information 2012 [employer]. 

## 2012-01-24 NOTE — ED Provider Notes (Signed)
History     CSN: [id number]  Arrival date & time 01/24/12  1523   First MD Initiated Contact with Patient 01/24/12 1524      Chief Complaint  Patient presents with  . Knee Pain    (Consider location/radiation/quality/duration/timing/severity/associated sxs/prior treatment) Patient is a 66 y.o. female presenting with knee pain. The history is provided by the patient.  Knee Pain    Mercedes Barr is a 66 y.o. female who c/o right knee pain for 2.5 weeks.  No known injury.  Describes pain aching, intermittent, that worsens with ambulation.  States previous history of same pain, has had same knee arthroscopy by Dr. Montez Morita in the past.  Reports in the last 3 days she has noted swelling and has been limping related to pain.  Pain is 8/10 at its worst.  Has taken regular medications which include Naprosyn with no relief in symptoms.  Knee brace improves symptoms until at rest during night. Past Medical History  Diagnosis Date  . Diabetes mellitus   . Hypertension   . Coronary artery disease     CABG 1988 S/P PACEMAKER    Past Surgical History  Procedure Date  . Coronary artery bypass graft 1988  . Pacemaker insertion 1988    Family History  Problem Relation Age of Onset  . Heart failure Mother   . Heart failure Sister     History  Substance Use Topics  . Smoking status: Never Smoker   . Smokeless tobacco: Not on file  . Alcohol Use: No    OB History    Grav Para Term Preterm Abortions TAB SAB Ect Mult Living                  Review of Systems  All other systems reviewed and are negative.    Allergies  Amitriptyline hcl; Benazepril hcl; Naproxen; Oxycodone-acetaminophen; Propoxyphene-acetaminophen; and Statins  Home Medications   Current Outpatient Rx  Name Route Sig Dispense Refill  . ALBUTEROL SULFATE (2.5 MG/3ML) 0.083% IN NEBU Nebulization Take 2.5 mg by nebulization 4 (four) times daily. As Needed.     . ASPIRIN 81 MG PO TABS Oral Take 81 mg by mouth  daily.      Marland Kitchen CALCIUM CARBONATE-VITAMIN D 500-200 MG-UNIT PO TABS Oral Take 2 tablets by mouth daily.      Marland Kitchen CLONAZEPAM 0.5 MG PO TABS Oral Take 1 tablet (0.5 mg total) by mouth 2 (two) times daily as needed for anxiety. 20 tablet 0  . EPINEPHRINE 0.3 MG/0.3ML IJ DEVI Intramuscular Inject 0.3 mLs (0.3 mg total) into the muscle once. For severe allergic reaction to bee sting 2 Device 5  . FLUOXETINE HCL 20 MG PO TABS Oral Take 2 tablets (40 mg total) by mouth daily. 60 tablet 11  . FLUTICASONE PROPIONATE  HFA 110 MCG/ACT IN AERO Inhalation Inhale 2 puffs into the lungs 2 (two) times daily. 1 Inhaler 11  . FUROSEMIDE 20 MG PO TABS  TAKE 1 TABLET DAILY (NEED APPOINTMENT) 30 tablet 5  . GLYBURIDE-METFORMIN 5-500 MG PO TABS  TAKE 2 TABLETS BY MOUTH TWICE DAILY 360 tablet 1  . HYDROCHLOROTHIAZIDE 12.5 MG PO CAPS  TAKE 1 CAPSULE BY MOUTH DAILY 90 capsule 3  . JANUVIA 100 MG PO TABS  TAKE 1 TABLET DAILY (NEED APPOINTMENT) 30 tablet 4  . LEVOTHYROXINE SODIUM 75 MCG PO TABS Oral Take 1 tablet (75 mcg total) by mouth daily. 90 tablet 3  . LOSARTAN POTASSIUM 50 MG PO TABS  TAKE 1 TABLET BY MOUTH DAILY 90 tablet 2  . NAPROXEN 500 MG PO TABS Oral Take 1 tablet (500 mg total) by mouth 2 (two) times daily with a meal. 14 tablet 0  . OMEPRAZOLE 20 MG PO CPDR Oral Take 1 capsule (20 mg total) by mouth 2 (two) times daily. 60 capsule 11    Please ask patient to make an appointment to discu ...  . POTASSIUM CHLORIDE 20 MEQ PO PACK Oral Take 20 mEq by mouth daily. 30 tablet 11  . POTASSIUM CHLORIDE CRYS ER 20 MEQ PO TBCR Oral Take 1 tablet (20 mEq total) by mouth daily. 90 tablet 3  . PROAIR HFA 108 (90 BASE) MCG/ACT IN AERS  INHALE 2 PUFFS INTO THE LUNGS EVERY 4 HOURS AS NEEDED FOR WHEEZING OR SHORTNESS OF BREATH 2 Inhaler 4  . PROPRANOLOL HCL 40 MG PO TABS Oral Take 1 tablet (40 mg total) by mouth 2 (two) times daily. 60 tablet 2    Patient needs to come in for appointment for furth ...  . TRAMADOL HCL 50 MG PO  TABS Oral Take 1 tablet (50 mg total) by mouth every 6 (six) hours as needed for pain. 15 tablet 0  . TRIAMCINOLONE ACETONIDE 0.5 % EX CREA Topical Apply topically. Apply to itchy areas two times a day until 2-3 days after healed.     Marland Kitchen ZOLPIDEM TARTRATE 10 MG PO TABS Oral Take 10 mg by mouth at bedtime as needed.        BP 115/61  Pulse 84  Temp 98.6 F (37 C) (Oral)  Resp 16  SpO2 98%  Physical Exam  Nursing note and vitals reviewed. Constitutional: She is oriented to person, place, and time. Vital signs are normal. She appears well-developed and well-nourished. She is active and cooperative.  HENT:  Head: Normocephalic.  Eyes: Conjunctivae are normal. Pupils are equal, round, and reactive to light. No scleral icterus.  Neck: Trachea normal. Neck supple.  Cardiovascular: Normal rate, regular rhythm, normal heart sounds, intact distal pulses and normal pulses.   Pulmonary/Chest: Effort normal and breath sounds normal.  Musculoskeletal:       Right knee: She exhibits swelling and effusion. She exhibits normal range of motion, no erythema, no LCL laxity, normal patellar mobility and no MCL laxity. tenderness found. LCL tenderness noted.       Left knee: Normal.       Right ankle: Normal. Achilles tendon normal.       Left ankle: Normal. Achilles tendon normal.  Neurological: She is alert and oriented to person, place, and time. She has normal strength. No cranial nerve deficit or sensory deficit. GCS eye subscore is 4. GCS verbal subscore is 5. GCS motor subscore is 6.  Skin: Skin is warm and dry.  Psychiatric: She has a normal mood and affect. Her speech is normal and behavior is normal. Judgment and thought content normal. Cognition and memory are normal.    ED Course  Procedures (including critical care time)  Labs Reviewed - No data to display No results found.   1. Right knee pain       MDM  Minimal effusion noted, continue medications as ordered, ultram to assist with  pain control, continue knee brace, f/u with pcp within 1-2 weeks.        Johnsie Kindred, NP 01/24/12 1744

## 2012-01-30 ENCOUNTER — Encounter: Payer: Self-pay | Admitting: Family Medicine

## 2012-01-30 ENCOUNTER — Ambulatory Visit (INDEPENDENT_AMBULATORY_CARE_PROVIDER_SITE_OTHER): Payer: Medicare Other | Admitting: Family Medicine

## 2012-01-30 VITALS — BP 102/69 | HR 87 | Temp 98.1°F | Ht 62.5 in | Wt 144.0 lb

## 2012-01-30 DIAGNOSIS — M25569 Pain in unspecified knee: Secondary | ICD-10-CM

## 2012-01-30 DIAGNOSIS — M25561 Pain in right knee: Secondary | ICD-10-CM

## 2012-01-30 DIAGNOSIS — E119 Type 2 diabetes mellitus without complications: Secondary | ICD-10-CM

## 2012-01-30 LAB — POCT GLYCOSYLATED HEMOGLOBIN (HGB A1C): Hemoglobin A1C: 9

## 2012-01-30 NOTE — Patient Instructions (Signed)
I am sorry your knee is hurting.  I want you to ice it tonight, and take the naproxen both in the morning and at night.  You can continue to take the Tramadol as needed for pain.  The office will contact you about a referral to dr. Despina Hick.

## 2012-01-31 ENCOUNTER — Encounter: Payer: Self-pay | Admitting: Family Medicine

## 2012-01-31 NOTE — Progress Notes (Signed)
  Subjective:    Patient ID: Mercedes Barr, female    DOB: November 24, 1945, 66 y.o.   MRN: [id number]  HPI  [name] comes in for a same-day visit for her knee pain.  She has had this pain for several years- but it had not bothered her much in a while.  She says she had a knee scope a few years ago and was told she had arthritis and that she might need a knee replacement surgery.  She is not interested in a knee replacement, so she wants to go see a different orthopedist. She would like to see Dr. Lequita Halt at [facility]. She denies any new injury to her knee or change in her activity.  She says the knee hurts when she is doing her exercise.  It also keeps her up at night.  She says it got a little swollen and she went to the urgent care, and they told her she needed to come see me.   She describes the pain as aching on the inside, denies any locking, clicking.   Review of Systems Pertinent items in HPI.     Objective:   Physical Exam BP 102/69  Pulse 87  Temp 98.1 F (36.7 C) (Oral)  Ht 5' 2.5" (1.588 m)  Wt 144 lb (65.318 kg)  BMI 25.92 kg/m2 General appearance: alert, cooperative and no distress  Knees: Left Normal to inspection with no erythema or effusion or obvious bony abnormalities, right with mild effusion Left Palpation normal with no warmth or joint line tenderness or patellar tenderness or condyle tenderness. Right knee with joint line tenderness to palpation. ROM normal in flexion and extension and lower leg rotation bilaterally.  Ligaments with solid consistent endpoints including ACL, PCL, LCL, MCL bilaterally.  Negative Mcmurray's and provocative meniscal tests. Non painful patellar compression. Patellar and quadriceps tendons unremarkable. Hamstring and quadriceps strength is normal.  Right Knee Injection:  Written consent obtained and verified. Sterile betadine prep. Furthur cleansed with alcohol. Topical analgesic spray: Ethyl chloride. Joint: Right  knee Approached in typical fashion with: Lateral Mid-patella  Completed without difficulty Meds: 1 cc Kenalog, 4 cc Lidocaine Aftercare instructions and Red flags advised.     Assessment & Plan:

## 2012-01-31 NOTE — Assessment & Plan Note (Addendum)
This is likely due to chronic osteoarthritis. S/P corticosteroid injection. Schedule NSAIDS, continue Tramadol.  Will refer to Ortho, she requests to go to [facility] orthopedics.

## 2012-02-17 ENCOUNTER — Telehealth: Payer: Self-pay | Admitting: Family Medicine

## 2012-02-17 NOTE — Telephone Encounter (Signed)
Patient is calling about the meds she gets from [facility].  She needs refills on some meds that [facility] will be faxing the list for.

## 2012-02-22 ENCOUNTER — Other Ambulatory Visit: Payer: Self-pay | Admitting: Family Medicine

## 2012-02-24 ENCOUNTER — Ambulatory Visit: Payer: Medicare Other | Admitting: Family Medicine

## 2012-03-07 ENCOUNTER — Other Ambulatory Visit: Payer: Self-pay | Admitting: Family Medicine

## 2012-03-08 ENCOUNTER — Ambulatory Visit (INDEPENDENT_AMBULATORY_CARE_PROVIDER_SITE_OTHER): Payer: Medicare Other | Admitting: Family Medicine

## 2012-03-08 ENCOUNTER — Other Ambulatory Visit: Payer: Self-pay | Admitting: Family Medicine

## 2012-03-08 ENCOUNTER — Encounter: Payer: Self-pay | Admitting: Family Medicine

## 2012-03-08 VITALS — BP 106/67 | HR 92 | Temp 98.3°F | Ht 62.5 in | Wt 148.0 lb

## 2012-03-08 DIAGNOSIS — G4700 Insomnia, unspecified: Secondary | ICD-10-CM

## 2012-03-08 DIAGNOSIS — E079 Disorder of thyroid, unspecified: Secondary | ICD-10-CM

## 2012-03-08 DIAGNOSIS — F329 Major depressive disorder, single episode, unspecified: Secondary | ICD-10-CM

## 2012-03-08 HISTORY — DX: Insomnia, unspecified: G47.00

## 2012-03-08 MED ORDER — ZOLPIDEM TARTRATE 5 MG PO TABS: 5.0000 mg | ORAL_TABLET | Freq: Every evening | ORAL | Status: DC | PRN

## 2012-03-08 MED ORDER — BUSPIRONE HCL 30 MG PO TABS: 30.0000 mg | ORAL_TABLET | Freq: Two times a day (BID) | ORAL | Status: DC | PRN

## 2012-03-08 MED ORDER — LOSARTAN POTASSIUM 50 MG PO TABS: 50.0000 mg | ORAL_TABLET | Freq: Every day | ORAL | Status: DC

## 2012-03-08 MED ORDER — SERTRALINE HCL 50 MG PO TABS: ORAL_TABLET | ORAL | Status: DC

## 2012-03-08 NOTE — Assessment & Plan Note (Signed)
Worsened with current situation.  Will change from fluoxitine to sertraline, and increase the dose in one week.  Also will try buspar as needed for anxiety.

## 2012-03-08 NOTE — Assessment & Plan Note (Signed)
Discussed good sleep hygiene, and that addressing anxiety/depression important for sleep. Rx for Ambien 5 mg po qhs prn.

## 2012-03-08 NOTE — Telephone Encounter (Signed)
Patient is calling for a new Rx for Losartin to go to [facility].

## 2012-03-08 NOTE — Progress Notes (Signed)
  Subjective:    Patient ID: Mercedes Barr, female    DOB: [birth year], 66 y.o.   MRN: [id number]  HPI  Mercedes Barr comes in for insomnia and anxiety. She has several things worrying her right now.  She has a Visual merchandiser, and her cardiologist told her she has 3 more years and then it will be replaced, and she is very anxious about this.  Also, her left knee pain is getting worse, and she is considering a knee replacement surgery, which she has many worries about.  Last of all, her grand daughter died of breast cancer last 2023/07/29, and she and her family are planning to celebrate her life at the anniversary;  She is very worked up emotionally about this.   She is taking fluoxitine, 20 mg at bedtime, but feels it keeps her up and makes her nauseated.  She does not take it in the morning because of the nausea.  She says she spends much of her time talking with her son, the father of the deceased grand daughter, and feels this is good therapy.    Review of Systems  Psychiatric/Behavioral: Positive for disturbed wake/sleep cycle, dysphoric mood and decreased concentration. Negative for suicidal ideas, hallucinations, behavioral problems and agitation. The patient is nervous/anxious.        Objective:   Physical Exam  Constitutional: She appears well-developed and well-nourished. No distress.  HENT:  Head: Normocephalic and atraumatic.  Psychiatric: Her speech is normal and behavior is normal. Judgment and thought content normal. Her mood appears anxious. Her affect is not inappropriate. Cognition and memory are normal. She exhibits a depressed mood.          Assessment & Plan:

## 2012-03-08 NOTE — Patient Instructions (Signed)
I am sorry you are having such a hard time.  Please stop taking the fluoxitine, and start taking the sertraline.  After one week, you can increase your dose to two tablets daily.

## 2012-03-08 NOTE — Assessment & Plan Note (Signed)
Will check TSH to ensure thyroid abnormalities not contributing to symptoms.

## 2012-03-09 ENCOUNTER — Ambulatory Visit: Payer: Medicare Other | Admitting: Family Medicine

## 2012-03-09 LAB — TSH: TSH: 0.81 u[IU]/mL (ref 0.350–4.500)

## 2012-03-14 ENCOUNTER — Other Ambulatory Visit: Payer: Self-pay | Admitting: Family Medicine

## 2012-03-15 ENCOUNTER — Telehealth: Payer: Self-pay | Admitting: Family Medicine

## 2012-03-15 NOTE — Telephone Encounter (Signed)
Pt is asking about her testing supply that she is to keep testing her BS  - she was testing 3x daily but realized that that might be too much, but she still needs the supply - she is now getting them from [facility] Wants to know how often doctor wants her to check her BS

## 2012-03-16 ENCOUNTER — Other Ambulatory Visit: Payer: Self-pay | Admitting: Family Medicine

## 2012-03-19 NOTE — Telephone Encounter (Signed)
Spoke with her husband Her HgbA1c was 9.0 recently I advised if she is able to tolerate, continue to check 3 x daily and write down numbers If consistently 80-200, may be able to check fewer times Husband says checking 3 x daily right now is no problem I advised her to call back if she has any other questions/concerns

## 2012-03-23 ENCOUNTER — Telehealth: Payer: Self-pay | Admitting: Family Medicine

## 2012-03-23 NOTE — Telephone Encounter (Signed)
Pt stated that she has ran out of her testing supplies. I asked her for the number for the company that she gets them through and it is [phone].  Called [facility] and informed them that we will need the form that will need to be filled out so that she can get her testing supplies. I was told that the form that they received did not have the Dr's signature and date They will fax form now.Mercedes Barr [place]

## 2012-03-28 NOTE — Telephone Encounter (Signed)
I do not see this form in [facility] box. Was this received and already handled?

## 2012-04-11 ENCOUNTER — Other Ambulatory Visit: Payer: Self-pay | Admitting: Family Medicine

## 2012-04-17 ENCOUNTER — Telehealth: Payer: Self-pay | Admitting: Family Medicine

## 2012-04-17 NOTE — Telephone Encounter (Signed)
Form for Diabetic Supplies for [facility] brought in by patient to be completed.  Placed form in Dr. Melina Modena box for signature.  Ileana Ladd

## 2012-04-17 NOTE — Telephone Encounter (Signed)
Patient dropped off form to be filled out for her diabetic supplies.  Please fax when completed.

## 2012-04-18 NOTE — Telephone Encounter (Signed)
[facility] form for Diabetic Supplies faxed to [phone].  Ileana Ladd

## 2012-04-25 ENCOUNTER — Other Ambulatory Visit: Payer: Self-pay | Admitting: Family Medicine

## 2012-05-03 ENCOUNTER — Ambulatory Visit: Payer: Medicare Other | Admitting: Family Medicine

## 2012-05-05 ENCOUNTER — Other Ambulatory Visit: Payer: Self-pay | Admitting: Family Medicine

## 2012-05-10 ENCOUNTER — Encounter: Payer: Self-pay | Admitting: Family Medicine

## 2012-05-10 ENCOUNTER — Ambulatory Visit (INDEPENDENT_AMBULATORY_CARE_PROVIDER_SITE_OTHER): Payer: Medicare Other | Admitting: Family Medicine

## 2012-05-10 VITALS — BP 105/70 | HR 89 | Temp 98.2°F | Ht 62.5 in | Wt 142.0 lb

## 2012-05-10 DIAGNOSIS — M545 Low back pain: Secondary | ICD-10-CM

## 2012-05-10 DIAGNOSIS — M79605 Pain in left leg: Secondary | ICD-10-CM

## 2012-05-10 DIAGNOSIS — Z23 Encounter for immunization: Secondary | ICD-10-CM

## 2012-05-10 MED ORDER — CYCLOBENZAPRINE HCL 5 MG PO TABS: 5.0000 mg | ORAL_TABLET | Freq: Two times a day (BID) | ORAL | Status: DC | PRN

## 2012-05-10 MED ORDER — TRAMADOL HCL 50 MG PO TABS
50.0000 mg | ORAL_TABLET | Freq: Three times a day (TID) | ORAL | Status: DC | PRN
Start: 2012-05-10 — End: 2012-10-03

## 2012-05-10 NOTE — Assessment & Plan Note (Signed)
Discussed arthritis in back as cause of pain.  Rx for tramadol and flexeril, advised to schedule ibuprofen.  Refer to PT, told her she would only have a few visits, needs to do exercises at home.

## 2012-05-10 NOTE — Progress Notes (Signed)
  Subjective:    Patient ID: Mercedes Barr, female    DOB: May 15, 1946, 66 y.o.   MRN: [id number]  HPI  Mercedes Barr comes in for a flair up of her low back pain.  She is not taking anything for the pain because she was not sure what was safe with her other medications.  She says the pain is terrible, it hurts to walk, she can't sleep, and she is having pains shooting down her buttocks and thighs.  No incontinence or weakness, numbness of LE. She has had no new injury.    2011 L-spine X-rays reviewed, they showed mild facet arthritis and mild narrowing of L4-L5 disc space.   Review of Systems    See HPI Objective:   Physical Exam BP 105/70  Pulse 89  Temp 98.2 F (36.8 C) (Oral)  Ht 5' 2.5" (1.588 m)  Wt 142 lb (64.411 kg)  BMI 25.56 kg/m2 General appearance: alert, cooperative and no distress Back: + TTP over L-spine, and paraspinal muscles. Normal strength and sensation of bilateral LE, normal reflexes. Straight leg test neg.        Assessment & Plan:

## 2012-05-10 NOTE — Patient Instructions (Signed)
It was good to see you- I am sorry your back is hurting so badly.  - I have put in a referral to Physical therapy, remember you will only have a few visits, so you need to learn the exercises and do them at home - Please take Ibuprofen 3 over the counter tablets, three times a day with breakfast, lunch, and dinner - Please take the Tramadol as needed for pain, and the Flexeril as needed for muscle spasm - Please make an appointment to see me in about 1 month so I can see how you are doing.

## 2012-06-08 ENCOUNTER — Encounter: Payer: Self-pay | Admitting: Family Medicine

## 2012-06-08 ENCOUNTER — Ambulatory Visit (INDEPENDENT_AMBULATORY_CARE_PROVIDER_SITE_OTHER): Payer: Medicare Other | Admitting: Family Medicine

## 2012-06-08 VITALS — BP 105/64 | HR 81 | Temp 97.8°F | Ht 62.5 in | Wt 145.0 lb

## 2012-06-08 DIAGNOSIS — Z1211 Encounter for screening for malignant neoplasm of colon: Secondary | ICD-10-CM

## 2012-06-08 DIAGNOSIS — F329 Major depressive disorder, single episode, unspecified: Secondary | ICD-10-CM

## 2012-06-08 DIAGNOSIS — I10 Essential (primary) hypertension: Secondary | ICD-10-CM

## 2012-06-08 DIAGNOSIS — E119 Type 2 diabetes mellitus without complications: Secondary | ICD-10-CM

## 2012-06-08 DIAGNOSIS — Z23 Encounter for immunization: Secondary | ICD-10-CM

## 2012-06-08 DIAGNOSIS — E785 Hyperlipidemia, unspecified: Secondary | ICD-10-CM

## 2012-06-08 DIAGNOSIS — G4700 Insomnia, unspecified: Secondary | ICD-10-CM

## 2012-06-08 LAB — BASIC METABOLIC PANEL
CO2: 29 mEq/L (ref 19–32)
Chloride: 100 mEq/L (ref 96–112)
Potassium: 4 mEq/L (ref 3.5–5.3)
Sodium: 141 mEq/L (ref 135–145)

## 2012-06-08 LAB — POCT GLYCOSYLATED HEMOGLOBIN (HGB A1C): Hemoglobin A1C: 7.9

## 2012-06-08 MED ORDER — ZOLPIDEM TARTRATE 5 MG PO TABS: 5.0000 mg | ORAL_TABLET | Freq: Every evening | ORAL | Status: DC | PRN

## 2012-06-08 MED ORDER — TETANUS-DIPHTH-ACELL PERTUSSIS 5-2.5-18.5 LF-MCG/0.5 IM SUSP: 0.5000 mL | Freq: Once | INTRAMUSCULAR | Status: DC

## 2012-06-08 MED ORDER — CITALOPRAM HYDROBROMIDE 20 MG PO TABS: 20.0000 mg | ORAL_TABLET | Freq: Every day | ORAL | Status: DC

## 2012-06-08 MED ORDER — HYDROCODONE-ACETAMINOPHEN 5-325 MG PO TABS: 1.0000 | ORAL_TABLET | Freq: Three times a day (TID) | ORAL | Status: DC | PRN

## 2012-06-08 NOTE — Progress Notes (Signed)
  Subjective:    Patient ID: Mercedes Barr, female    DOB: Aug 31, 1945, 66 y.o.   MRN: [id number]  HPI  Mercedes Barr comes in for follow up.    DM: Patient is taking [place] and glucovance.  Patient sometimes is checking blood sugars.  Fasting sugars range from 100 to 150's.  No hyper or hypoglycemic episodes, no polyuria or polydipsia.  She is due for an eye exam.   Depression: Patient's granddaughter passed away about one year ago, and she has been feeling very sad lately.  She describes depressed mood, poor appetite, insomnia, and patient says she is isolating herself from other family. She is not taking zoloft that we started last year.   Knee pain- pt with OA in knees, R worse than left.  She sees Dr. Despina Hick, and has had a knee scope several years ago.  She is having significant pain and is going to see him on Nov 15th to discuss total knee replacement. She says the pain is severe on cold days and when it rains, tramadol is not controlling her pain.   Health Maintenance:  Up to date on flu shot Due for Tdap and Pneumococcal Due for second colonoscopy Due for mammogram.   Past Medical History  Diagnosis Date  . Diabetes mellitus   . Hypertension   . Coronary artery disease     CABG 1988 S/P PACEMAKER   Family History  Problem Relation Age of Onset  . Heart failure Mother   . Heart failure Sister    History  Substance Use Topics  . Smoking status: Never Smoker   . Smokeless tobacco: Not on file  . Alcohol Use: No     Review of Systems See HPI    Objective:   Physical Exam BP 105/64  Pulse 81  Temp 97.8 F (36.6 C) (Oral)  Ht 5' 2.5" (1.588 m)  Wt 145 lb (65.772 kg)  BMI 26.10 kg/m2 General appearance: alert, cooperative and no distress Lungs: clear to auscultation bilaterally Heart: regular rate and rhythm, S1, S2 normal, no murmur, click, rub or gallop Extremities: extremities normal, atraumatic, no cyanosis or edema Pulses: 2+ and symmetric       Assessment &  Plan:

## 2012-06-08 NOTE — Assessment & Plan Note (Signed)
Likely secondary to depression and/or grief.  Rx ambien as needed.

## 2012-06-08 NOTE — Patient Instructions (Addendum)
It was good to see you.  You are due for your eye exam, mammogram, and your colonoscopy.  Please call and schedule your eye doctor appointment and mammogram.  I will make a referral for you to go back and see [facility] for your colonoscopy.    Your Hemoglobin A1C is  Lab Results  Component Value Date   HGBA1C 7.9 06/08/2012  .  Remember your goal for A1C is less than 7.  Your goal for fasting morning blood sugar is 80-120.  Please be sure to take your Januvia and Glucovance every day.  To help control your blood sugars, please avoid and minimize foods with lots of carbohydrates and sugars.  Those foods include breads, pastas, potatoes, corn, sweets and deserts.  Try to increase the amount and variety of vegetables you eat, and include a veggie with each meal.

## 2012-06-08 NOTE — Assessment & Plan Note (Signed)
Will start Celexa, told patient she needs to take it every day for it to work.  Offered therapy which she declines.

## 2012-06-08 NOTE — Assessment & Plan Note (Signed)
Well controlled, will check BMET.

## 2012-06-08 NOTE — Addendum Note (Signed)
Addended by: Damita Lack on: 06/08/2012 03:10 PM   Modules accepted: Orders

## 2012-06-08 NOTE — Assessment & Plan Note (Signed)
Improved control, encouraged diet and exercise, no medication changes today.

## 2012-06-12 ENCOUNTER — Encounter: Payer: Self-pay | Admitting: Family Medicine

## 2012-06-18 ENCOUNTER — Encounter: Payer: Self-pay | Admitting: Home Health Services

## 2012-06-20 ENCOUNTER — Encounter: Payer: Self-pay | Admitting: Home Health Services

## 2012-06-21 ENCOUNTER — Telehealth: Payer: Self-pay | Admitting: *Deleted

## 2012-06-21 NOTE — Telephone Encounter (Signed)
[facility] calling to clarify medication.  Patient requested med refill of sertraline 50 mg.  Insurance company states patient received citalopram 20 mg at first of the month and will not authorize both meds.  Patient has filled meds at two different pharmacies.  Please clarify which med patient supposed to be taking.  When calling back, ask for pharmacist and use Ref. # [id number].  Will route note to MD and call pharmacy back.  Gaylene Brooks, RN

## 2012-06-22 NOTE — Telephone Encounter (Signed)
At her last visit, we d/c'd zoloft and started celexa.  Please notify pharmacy.

## 2012-06-22 NOTE — Telephone Encounter (Signed)
Returned call to [facility] and spoke with [employer] (pharmacist).  Informed that patient should be on Celexa and Zoloft was d/c'd.  Gaylene Brooks, RN

## 2012-06-27 ENCOUNTER — Other Ambulatory Visit: Payer: Self-pay | Admitting: Family Medicine

## 2012-06-28 ENCOUNTER — Other Ambulatory Visit: Payer: Self-pay | Admitting: Family Medicine

## 2012-06-28 MED ORDER — SITAGLIPTIN PHOSPHATE 100 MG PO TABS: 100.0000 mg | ORAL_TABLET | Freq: Every day | ORAL | Status: DC

## 2012-06-28 MED ORDER — POTASSIUM CHLORIDE CRYS ER 20 MEQ PO TBCR: 20.0000 meq | EXTENDED_RELEASE_TABLET | Freq: Every day | ORAL | Status: DC

## 2012-07-10 ENCOUNTER — Other Ambulatory Visit: Payer: Self-pay | Admitting: Family Medicine

## 2012-07-10 NOTE — Telephone Encounter (Signed)
Patient contacted [facility] on [address] for her refill on Ambien but she was told that the doctor needs to give approval before she can pick up the Rx.  She doesn't have any left.

## 2012-07-10 NOTE — Telephone Encounter (Signed)
Spoke with pharmacy patient never got rx filled form 11/1, called patient and informed her she could go and pick rx up. Patient expressed understanding.

## 2012-07-12 ENCOUNTER — Telehealth: Payer: Self-pay | Admitting: Family Medicine

## 2012-07-12 NOTE — Telephone Encounter (Signed)
Patient was told yesterday that her refill for Ambien was being sent to her pharmacy and she could go ahead and pick it up, but when she went, it wasn't there and she would like it resent.

## 2012-07-12 NOTE — Telephone Encounter (Signed)
When RX was written on 06/08/2012 had one refill. I called pharmacy and was told pharmacy does have this and it is ready for pick up. Patient notified.

## 2012-07-12 NOTE — Telephone Encounter (Signed)
No note  on med list that this was done . Will send to Dr. Lula Olszewski.

## 2012-07-13 ENCOUNTER — Other Ambulatory Visit: Payer: Self-pay | Admitting: Family Medicine

## 2012-08-09 ENCOUNTER — Telehealth: Payer: Self-pay | Admitting: Family Medicine

## 2012-08-09 NOTE — Telephone Encounter (Signed)
Spoke with patient.  She will have the [facility] send her the forms and she will make an appt.  As far as the [facility], she states that she cant get her nebulizer meds from [facility] anymore, but she called [facility] and they are supposed to be sending the order form to Dr. Lula Olszewski. Shamere Dilworth, Maryjo [place]

## 2012-08-09 NOTE — Telephone Encounter (Signed)
Patient is calling because she needs Diabetic Shoes so an order needs to be sent to [facility], some info from them should have been sent.  She needs to speak to someone about her Nebulizer treatments, [facility] should be sending an order.

## 2012-08-14 ENCOUNTER — Other Ambulatory Visit: Payer: Self-pay | Admitting: Family Medicine

## 2012-08-16 ENCOUNTER — Other Ambulatory Visit: Payer: Self-pay | Admitting: Family Medicine

## 2012-08-20 ENCOUNTER — Telehealth: Payer: Self-pay | Admitting: Family Medicine

## 2012-08-20 ENCOUNTER — Other Ambulatory Visit: Payer: Self-pay | Admitting: Family Medicine

## 2012-08-20 NOTE — Telephone Encounter (Signed)
Mercedes Barr is calling to see if Dr. Lula Olszewski will prescribe a stronger pain medication for her knee pain.  The Tramadol is helping at all.

## 2012-08-21 NOTE — Telephone Encounter (Signed)
Pt agreeable to plan.  Appt in the am @ 945. Jivan Symanski, Maryjo [place]

## 2012-08-21 NOTE — Telephone Encounter (Signed)
Patient wants to add to her previous message that she was told that she needs a Colonoscopy but she hasn't heard anything back on that referral.

## 2012-08-21 NOTE — Telephone Encounter (Signed)
Patient is calling back because she hasn't heard back and she is in severe pain.

## 2012-08-21 NOTE — Telephone Encounter (Signed)
She will need to be seen for her knee pain for new prescriptions, please let her know.   Please let her know I apologize I misinformed her last visit, she is not due for a colonoscopy.  She had one in 2012, and should not need one until  2017.  She is due for a Mammogram though, and can call [facility] or [facility] to schedule that (please give her contact info).

## 2012-08-22 ENCOUNTER — Encounter: Payer: Self-pay | Admitting: Family Medicine

## 2012-08-22 ENCOUNTER — Telehealth: Payer: Self-pay | Admitting: Family Medicine

## 2012-08-22 ENCOUNTER — Other Ambulatory Visit: Payer: Self-pay | Admitting: Family Medicine

## 2012-08-22 ENCOUNTER — Ambulatory Visit (INDEPENDENT_AMBULATORY_CARE_PROVIDER_SITE_OTHER): Payer: Medicare Other | Admitting: Family Medicine

## 2012-08-22 VITALS — BP 120/74 | HR 92 | Temp 97.7°F | Ht 62.5 in | Wt 141.0 lb

## 2012-08-22 DIAGNOSIS — M1711 Unilateral primary osteoarthritis, right knee: Secondary | ICD-10-CM | POA: Insufficient documentation

## 2012-08-22 DIAGNOSIS — M1710 Unilateral primary osteoarthritis, unspecified knee: Secondary | ICD-10-CM

## 2012-08-22 DIAGNOSIS — M25561 Pain in right knee: Secondary | ICD-10-CM

## 2012-08-22 DIAGNOSIS — L989 Disorder of the skin and subcutaneous tissue, unspecified: Secondary | ICD-10-CM | POA: Insufficient documentation

## 2012-08-22 DIAGNOSIS — M159 Polyosteoarthritis, unspecified: Secondary | ICD-10-CM | POA: Insufficient documentation

## 2012-08-22 DIAGNOSIS — M25569 Pain in unspecified knee: Secondary | ICD-10-CM

## 2012-08-22 MED ORDER — HYDROCODONE-ACETAMINOPHEN 5-325 MG PO TABS: 1.0000 | ORAL_TABLET | Freq: Four times a day (QID) | ORAL | Status: DC | PRN

## 2012-08-22 MED ORDER — POTASSIUM CHLORIDE CRYS ER 20 MEQ PO TBCR: 20.0000 meq | EXTENDED_RELEASE_TABLET | Freq: Every day | ORAL | Status: DC

## 2012-08-22 NOTE — Assessment & Plan Note (Signed)
This does not have a worrisome appearance, appears to be irritated oil or hair gland. Advised warm compresses, neosporin, and watchful waiting.  Discussed that skin cancer risk in African Americans is very low, but to let me know if the lesion does not resolve.

## 2012-08-22 NOTE — Assessment & Plan Note (Signed)
I have asked her to schedule tylenol, use tramadol and ice to manage pain.  Rx for hydrocodone written for severe pain, discussed narcotic use, patient understands to use only for severe pain.

## 2012-08-22 NOTE — Assessment & Plan Note (Signed)
>>  ASSESSMENT AND PLAN FOR OSTEOARTHRITIS OF RIGHT KNEE WRITTEN ON 08/22/2012 10:19 AM BY CHAMBERLAIN, RACHEL, MD  This is severe OA, uncontrolled pain.  She is scheduled for total knee replacement, will manage pain in the mean time.

## 2012-08-22 NOTE — Progress Notes (Signed)
  Subjective:    Patient ID: Mercedes Barr, female    DOB: 23-Apr-1946, 67 y.o.   MRN: [id number]  HPI  Mercedes Barr comes in due to worsening of her right knee pain.  She has severe OA in her knee, and is scheduled for a TKR by Dr. Despina Hick in April.  She cannot take NSAIDS due to stomach problems.  She has been taking tramadol but it is not helping.  She is not taking tylenol, not is she icing her knee either.  The last time she got a cortisone shot it did not help her.  She used to walk around the block for exercise, but the knee pain is so severe she is unable to walk around the house, is having difficulty doing her laundry and cooking.    She also complains about a skin lesion on her left thigh.  She says it started a few weeks ago as a black head and has gotten bigger.  It is not painful, does not itch.  It does not have warmth, she has not had fevers, no history of skin lesions.   I have reviewed the patient's medical history in detail and updated the computerized patient record.  Review of Systems See HPI    Objective:   Physical Exam BP 120/74  Pulse 92  Temp 97.7 F (36.5 C) (Oral)  Ht 5' 2.5" (1.588 m)  Wt 141 lb (63.957 kg)  BMI 25.38 kg/m2 Gen: No distress Skin: there is .5cm hypopigmented lesion with some peeling on left thigh a few inches above the knee.   Right knee: +crepitus, painful to touch over both medial and lateral joint lines, patella No obvious effusion and stable ligaments ROM limited to slightly past 90 degrees in flexion and to 165 degrees in extension.       Assessment & Plan:

## 2012-08-22 NOTE — Telephone Encounter (Signed)
Patient is unable to get the Rx that was prescribed today until Dr. Lula Olszewski calls [facility] at [phone].  She can't do anything to get this medication because she can't afford to pay the $50 out of pocket.

## 2012-08-22 NOTE — Assessment & Plan Note (Signed)
This is severe OA, uncontrolled pain.  She is scheduled for total knee replacement, will manage pain in the mean time.

## 2012-08-22 NOTE — Patient Instructions (Signed)
I am sorry your knee is hurting so badly.  Please start taking tylenol (generic name is acetaminophen) Arthritis strength (650 mg) three times a day with breakfast, lunch, and dinner.  Please continue to use the tramadol as needed for pain.  I also want you to ice your knee 1-2 times every day for twenty minutes.   When you have severe pain, you can use the hydrocodone.  Remember this is not a medication for every day, just when you are hurting badly.   Please try warm compresses and neosporin on the spot on your left leg, if it does not go away let me know.   Please come back and see me again in 1-2 months for your next check up.

## 2012-08-24 NOTE — Telephone Encounter (Signed)
Called patient, she ended up taking the prescription to another pharmacy and was able to get the hydrocodone.

## 2012-08-29 ENCOUNTER — Other Ambulatory Visit: Payer: Self-pay | Admitting: Family Medicine

## 2012-09-04 ENCOUNTER — Telehealth: Payer: Self-pay | Admitting: Family Medicine

## 2012-09-04 NOTE — Telephone Encounter (Signed)
Patient is currently getting her nebulizer meds, but has requested a change due to SOB and they needs to speak to someone about this.

## 2012-09-04 NOTE — Telephone Encounter (Signed)
Left message with on [name] voicemail to rtn my call. Mercedes Barr, [name]

## 2012-09-13 ENCOUNTER — Other Ambulatory Visit (HOSPITAL_COMMUNITY): Payer: Self-pay | Admitting: Cardiovascular Disease

## 2012-09-13 DIAGNOSIS — Q249 Congenital malformation of heart, unspecified: Secondary | ICD-10-CM

## 2012-09-13 LAB — PACEMAKER DEVICE OBSERVATION

## 2012-09-17 ENCOUNTER — Telehealth: Payer: Self-pay | Admitting: Family Medicine

## 2012-09-17 NOTE — Telephone Encounter (Signed)
Patient is calling because she has received another letter stating that her Glucovance will no longer be covered unless Dr. Lula Barr gives her approval.  [employer] Coverage - [phone].

## 2012-09-18 ENCOUNTER — Ambulatory Visit: Payer: Medicare Other | Admitting: Family Medicine

## 2012-09-18 MED ORDER — GLYBURIDE 5 MG PO TABS: 5.0000 mg | ORAL_TABLET | Freq: Two times a day (BID) | ORAL | Status: DC

## 2012-09-18 MED ORDER — METFORMIN HCL 500 MG PO TABS: 500.0000 mg | ORAL_TABLET | Freq: Two times a day (BID) | ORAL | Status: DC

## 2012-09-18 NOTE — Telephone Encounter (Signed)
Called patient, discussed that glucophage is combo pill- Rx sent in for glyburide and metformin separately.  She voices understanding.

## 2012-09-25 ENCOUNTER — Ambulatory Visit (HOSPITAL_COMMUNITY)
Admission: RE | Admit: 2012-09-25 | Discharge: 2012-09-25 | Disposition: A | Discharge status: Home / Self Care | Payer: Medicare Other | Source: Ambulatory Visit | Attending: Cardiovascular Disease | Admitting: Cardiovascular Disease

## 2012-09-25 DIAGNOSIS — Q249 Congenital malformation of heart, unspecified: Secondary | ICD-10-CM

## 2012-10-03 ENCOUNTER — Ambulatory Visit (INDEPENDENT_AMBULATORY_CARE_PROVIDER_SITE_OTHER): Payer: Medicare Other | Admitting: Family Medicine

## 2012-10-03 ENCOUNTER — Encounter: Payer: Self-pay | Admitting: Family Medicine

## 2012-10-03 VITALS — BP 124/73 | HR 71 | Ht 62.5 in | Wt 143.0 lb

## 2012-10-03 DIAGNOSIS — E079 Disorder of thyroid, unspecified: Secondary | ICD-10-CM

## 2012-10-03 DIAGNOSIS — E785 Hyperlipidemia, unspecified: Secondary | ICD-10-CM

## 2012-10-03 DIAGNOSIS — I10 Essential (primary) hypertension: Secondary | ICD-10-CM

## 2012-10-03 DIAGNOSIS — G4700 Insomnia, unspecified: Secondary | ICD-10-CM

## 2012-10-03 DIAGNOSIS — E1165 Type 2 diabetes mellitus with hyperglycemia: Secondary | ICD-10-CM

## 2012-10-03 LAB — BASIC METABOLIC PANEL
BUN: 13 mg/dL (ref 6–23)
CO2: 27 mEq/L (ref 19–32)
Chloride: 100 mEq/L (ref 96–112)
Creat: 0.79 mg/dL (ref 0.50–1.10)
Glucose, Bld: 250 mg/dL — ABNORMAL HIGH (ref 70–99)

## 2012-10-03 LAB — CBC
MCH: 27.3 pg (ref 26.0–34.0)
MCHC: 33.2 g/dL (ref 30.0–36.0)
Platelets: 252 10*3/uL (ref 150–400)
RDW: 14.2 % (ref 11.5–15.5)

## 2012-10-03 LAB — LIPID PANEL
Cholesterol: 120 mg/dL (ref 0–200)
Total CHOL/HDL Ratio: 3.3 Ratio
Triglycerides: 168 mg/dL — ABNORMAL HIGH (ref <150)
VLDL: 34 mg/dL (ref 0–40)

## 2012-10-03 LAB — POCT GLYCOSYLATED HEMOGLOBIN (HGB A1C): Hemoglobin A1C: 8.6

## 2012-10-03 MED ORDER — ZOLPIDEM TARTRATE ER 6.25 MG PO TBCR
6.2500 mg | EXTENDED_RELEASE_TABLET | Freq: Every evening | ORAL | Status: DC | PRN
Start: 2012-10-03 — End: 2012-10-09

## 2012-10-03 MED ORDER — METFORMIN HCL 500 MG PO TABS: 1000.0000 mg | ORAL_TABLET | Freq: Two times a day (BID) | ORAL | Status: DC

## 2012-10-03 MED ORDER — TRAMADOL HCL 50 MG PO TABS: 50.0000 mg | ORAL_TABLET | Freq: Three times a day (TID) | ORAL | Status: DC | PRN

## 2012-10-03 NOTE — Patient Instructions (Signed)
It was good to see you. Your A1C today is 8.6, so I want you to increase your metformin to 2 pills in the morning and 2 pills in the evening.  Continue the glyburide and Januvia.    I will send you a letter with your lab results, or call you if anything is abnormal.    Please come back and see me in about 3 months, or sooner if needed.

## 2012-10-03 NOTE — Assessment & Plan Note (Signed)
Will try CR ambien since she has difficulty staying asleep.

## 2012-10-03 NOTE — Progress Notes (Signed)
  Subjective:    Patient ID: Mercedes Barr, female    DOB: 1946-03-25, 67 y.o.   MRN: [id number]  HPI: DM: Patient is taking Metformin 500 BID, Glyburide 5 BID, and Januvia 100 daily.  Patient occasionally checking blood sugars.  Fasting sugars range from 100 to 150.  No hyper or hypoglycemic episodes, no polyuria or polydypisa.  Taking Baby asprin daily  HTN: Taking Losartan, Inderal, HCTZ, lasix without difficulty.  Denies dizziness, palpitations, LE edema.  She does endorse some non-exertional chest pain that has been stable for several months.  Is scheduled for ECHO at [facility] prior to her knee surgery.  She complains again about her insomnia.  She says she falls a sleep but then wakes up at 2-3 am and cannot fall back asleep.  Regular ambien did not make a difference.   HLD- Has had mildly elevated cholesterol but not on statin.    Hypothyroidism- on synthroid, denies palpitations, denies fatigue, weight loss, weight gain.  Has not had checked in 6 months.   Past Medical History  Diagnosis Date  . Diabetes mellitus   . Hypertension   . Coronary artery disease     CABG 1988 S/P PACEMAKER    History  Substance Use Topics  . Smoking status: Never Smoker   . Smokeless tobacco: Not on file  . Alcohol Use: No    Family History  Problem Relation Age of Onset  . Heart failure Mother   . Heart failure Sister      ROS Pertinent items in HPI    Objective:  Physical Exam:  BP 124/73  Pulse 71  Ht 5' 2.5" (1.588 m)  Wt 143 lb (64.864 kg)  BMI 25.72 kg/m2 General appearance: alert, cooperative and no distress Head: Normocephalic, without obvious abnormality, atraumatic Neck: Thyroid normal in size Lungs: clear to auscultation bilaterally Heart: regular rate and rhythm, S1, S2 normal, no murmur, click, rub or gallop Pulses: 2+ and symmetric       Assessment & Plan:

## 2012-10-03 NOTE — Assessment & Plan Note (Signed)
A1C elevated, would like optimal control for upcomming TKR surgery.  Increase Metformin to 1000 PO bid, cont. Glyburide and Januvia.

## 2012-10-03 NOTE — Assessment & Plan Note (Signed)
Will check BMET and CBC.  Well controlled. No medication changes.

## 2012-10-03 NOTE — Assessment & Plan Note (Signed)
Recheck lipid profile 

## 2012-10-03 NOTE — Assessment & Plan Note (Signed)
Check TSH, continue levothyroxine

## 2012-10-04 ENCOUNTER — Telehealth: Payer: Self-pay | Admitting: Family Medicine

## 2012-10-04 NOTE — Telephone Encounter (Signed)
LMOM asking pt to call Ins co and find out what sleep aid they do cover and to let us know so we can inform Dr Lula Olszewski.

## 2012-10-04 NOTE — Telephone Encounter (Signed)
Patient is calling because her insurance is no longer paying for her Ambien and she needs something else sent in to her pharmacy that is covered.

## 2012-10-04 NOTE — Telephone Encounter (Signed)
Tramadol refill was sent to wrong pharmacy - [facility] [address]

## 2012-10-05 NOTE — Telephone Encounter (Signed)
Will forward to Dr. Chamberlain 

## 2012-10-05 NOTE — Telephone Encounter (Signed)
Pt will call back - asking about sleeping pills -   Also, [facility] to [facility].

## 2012-10-09 ENCOUNTER — Other Ambulatory Visit: Payer: Self-pay | Admitting: Family Medicine

## 2012-10-09 MED ORDER — ZOLPIDEM TARTRATE 5 MG PO TABS
5.0000 mg | ORAL_TABLET | Freq: Every evening | ORAL | Status: DC | PRN
Start: 2012-10-09 — End: 2012-10-10

## 2012-10-09 NOTE — Telephone Encounter (Signed)
Rx for ambien 5 mg sent to pharmacy.

## 2012-10-09 NOTE — Telephone Encounter (Signed)
Called pt, she called her insurance company and they will pay for the Ambien 5mg , but not the 6.25mg .  Asking that this be sent to [facility] pharmacy (mail order).   Pt also ask [facility], made [facility]  Pt ok with this Mercedes Barr, Mercedes Barr [place]

## 2012-10-09 NOTE — Telephone Encounter (Signed)
Please call patient- let her know I will be happy to change her sleeping medication, she can call her insurance company and find out which one is covered, and call us back to let me know.

## 2012-10-10 ENCOUNTER — Telehealth: Payer: Self-pay | Admitting: Family Medicine

## 2012-10-10 MED ORDER — ZOLPIDEM TARTRATE 5 MG PO TABS: 5.0000 mg | ORAL_TABLET | Freq: Every evening | ORAL | Status: DC | PRN

## 2012-10-10 MED ORDER — TRAMADOL HCL 50 MG PO TABS: 50.0000 mg | ORAL_TABLET | Freq: Three times a day (TID) | ORAL | Status: DC | PRN

## 2012-10-10 NOTE — Telephone Encounter (Addendum)
Spoke with patient and she states [facility]   Does not provide ambien or tramadol. Wants [facility]  [facility]  [address]. Will ask MD to print off  and resend.( I was unable to find original rx for ambien in faxed file from 03/04)

## 2012-10-10 NOTE — Telephone Encounter (Signed)
Rx called in and pt notified. Fleeger, Maryjo [place]

## 2012-10-10 NOTE — Telephone Encounter (Signed)
Ambien called to pharmacy. Patient notified.

## 2012-10-10 NOTE — Telephone Encounter (Signed)
Pt states that [facility] will not accept script for Ambien - pls [facility]

## 2012-10-10 NOTE — Telephone Encounter (Signed)
[facility]  Please phone in ambien 5 mg po qhs prn, #15, refill 2.

## 2012-10-11 ENCOUNTER — Encounter: Payer: Self-pay | Admitting: Family Medicine

## 2012-10-11 ENCOUNTER — Ambulatory Visit (HOSPITAL_COMMUNITY): Payer: Medicare Other

## 2012-10-12 ENCOUNTER — Other Ambulatory Visit: Payer: Self-pay | Admitting: Family Medicine

## 2012-10-12 MED ORDER — METFORMIN HCL 500 MG PO TABS: 1000.0000 mg | ORAL_TABLET | Freq: Two times a day (BID) | ORAL | Status: DC

## 2012-10-12 MED ORDER — GLYBURIDE 5 MG PO TABS: 5.0000 mg | ORAL_TABLET | Freq: Two times a day (BID) | ORAL | Status: DC

## 2012-10-12 MED ORDER — SITAGLIPTIN PHOSPHATE 100 MG PO TABS: 100.0000 mg | ORAL_TABLET | Freq: Every day | ORAL | Status: DC

## 2012-10-12 MED ORDER — LEVOTHYROXINE SODIUM 75 MCG PO TABS: 75.0000 ug | ORAL_TABLET | Freq: Every day | ORAL | Status: DC

## 2012-10-13 ENCOUNTER — Encounter: Payer: Self-pay | Admitting: Internal Medicine

## 2012-10-13 DIAGNOSIS — L84 Corns and callosities: Secondary | ICD-10-CM | POA: Insufficient documentation

## 2012-10-15 ENCOUNTER — Other Ambulatory Visit: Payer: Self-pay | Admitting: *Deleted

## 2012-10-15 MED ORDER — METFORMIN HCL 500 MG PO TABS: 1000.0000 mg | ORAL_TABLET | Freq: Two times a day (BID) | ORAL | Status: DC

## 2012-10-15 MED ORDER — SITAGLIPTIN PHOSPHATE 100 MG PO TABS: 100.0000 mg | ORAL_TABLET | Freq: Every day | ORAL | Status: DC

## 2012-10-15 MED ORDER — LEVOTHYROXINE SODIUM 75 MCG PO TABS: 75.0000 ug | ORAL_TABLET | Freq: Every day | ORAL | Status: DC

## 2012-10-15 MED ORDER — GLYBURIDE 5 MG PO TABS: 5.0000 mg | ORAL_TABLET | Freq: Two times a day (BID) | ORAL | Status: DC

## 2012-10-22 ENCOUNTER — Telehealth: Payer: Self-pay | Admitting: Family Medicine

## 2012-10-22 NOTE — Telephone Encounter (Signed)
Pt was told to increase her glyburide and metformin to 2 - 2x daily - when she got the script, it had the original on there and she will run out.  Not sure what to do.

## 2012-10-22 NOTE — Telephone Encounter (Signed)
Will route to MD for clarification.  Mercedes Barr, CMA

## 2012-10-23 MED ORDER — METFORMIN HCL 1000 MG PO TABS: 1000.0000 mg | ORAL_TABLET | Freq: Two times a day (BID) | ORAL | Status: DC

## 2012-10-23 MED ORDER — GLYBURIDE 5 MG PO TABS: 10.0000 mg | ORAL_TABLET | Freq: Two times a day (BID) | ORAL | Status: DC

## 2012-10-23 NOTE — Telephone Encounter (Signed)
Called patient back- she is working very hard to get her blood sugar under control before her knee surgery in April.  She has been taking Januvia + glyburide 10 BID and metformin 1000 BID.  Her fasting sugars have been around 120-130, sometimes as high as 140.  Denies hypoglycemia.   I told her that I had only changed the metformin to 2 pills in AM and 2 in PM, but sounds like she has better control with increase of glyburide too.  New prescriptions for both glyburide (10 mg po BID) and metformin (1000 mg PO BID) sent to pharmacy.    Mercedes Barr 10/23/2012 10:45 AM

## 2012-10-24 ENCOUNTER — Other Ambulatory Visit (HOSPITAL_COMMUNITY): Payer: Self-pay | Admitting: Cardiovascular Disease

## 2012-10-24 ENCOUNTER — Ambulatory Visit (HOSPITAL_COMMUNITY)
Admission: RE | Admit: 2012-10-24 | Discharge: 2012-10-24 | Disposition: A | Discharge status: Home / Self Care | Payer: Medicare Other | Source: Ambulatory Visit | Attending: Cardiovascular Disease | Admitting: Cardiovascular Disease

## 2012-10-24 ENCOUNTER — Other Ambulatory Visit: Payer: Self-pay | Admitting: Orthopedic Surgery

## 2012-10-24 ENCOUNTER — Encounter: Payer: Self-pay | Admitting: *Deleted

## 2012-10-24 DIAGNOSIS — Q249 Congenital malformation of heart, unspecified: Secondary | ICD-10-CM | POA: Insufficient documentation

## 2012-10-24 NOTE — H&P (Signed)
Mercedes Barr  DOB: [birth year] Divorced / Language: English / Race: Black or African American Female  Date of Admission:  11/19/2012  Chief Complaint:  Right Knee Pain  History of Present Illness The patient is a 67 year old female who comes in for a preoperative History and Physical. The patient is scheduled for a right total knee arthroplasty to be performed by Dr. Gus Rankin. Aluisio, MD at [facility] on 11/19/12. The patient is a 67 year old female who presents with knee complaints. The patient was seen for a second opinion. The patient reports left knee and right knee symptoms including: pain, swelling, catching (popping) and stiffness . The patient has the current diagnosis of knee osteoarthritis. Prior to being seen, the patient was previously evaluated by a colleague. Previous work-up for this problem has included knee x-rays and arthroscopy (on the right 09/30/08 by Dr. Myrtie Neither). She states the right knee is the worst. She states the left knee is not currently giving her a problem. She did well for about a year and a half after her surgery. She started having pain with weightbearing after that period. Since then she has been progressively worse. She has gotten to the point that she has pain at rest now and a lot of trouble with stiffness if she sits for a long period of time. She has had a cortisone injection in the right knee several months ago which helped for a month. She has never had viscosupplementation. She reports that Dr. Montez Morita told her that she needs a total knee replacement but she wanted to hear that from another physician before having the surgery done. She states the knee is getting progressively worse over time. She did have a cortisone and visco supplement injections without much benefit a few years ago. If anything she is much worse now than she was then. She is at a stage where the knee is preventing her from doing things she desires and she is hurting  all the time. She is ready to proceed with surgery. They have been treated conservatively in the past for the above stated problem and despite conservative measures, they continue to have progressive pain and severe functional limitations and dysfunction. They have failed non-operative management including home exercise, medications, and injections. It is felt that they would benefit from undergoing total joint replacement. Risks and benefits of the procedure have been discussed with the patient and they elect to proceed with surgery. There are no active contraindications to surgery such as ongoing infection or rapidly progressive neurological disease.   Problem List Primary osteoarthritis of both knees (715.16)   Allergies Amitriptyline HCl *ANTIDEPRESSANTS*. Itching, Nausea. Tylox *ANALGESICS - OPIOID*. Itching. Benazepril HCl *ANTIHYPERTENSIVES* Naproxen *ANALGESICS - ANTI-INFLAMMATORY* Propoxyphene-Acetaminophen *ANALGESICS - OPIOID*   Family History Drug / Alcohol Addiction. brother Diabetes Mellitus. sister and brother Heart disease in female family member before age 26 Heart Disease. mother Cerebrovascular Accident. father Depression. sister Congestive Heart Failure. mother Hypertension. mother, father, sister, brother and grandfather mothers side Liver Disease, Chronic. brother Kidney disease. sister   Social History Drug/Alcohol Rehab (Previously). no Drug/Alcohol Rehab (Currently). no Illicit drug use. no Exercise. Exercises rarely; does other Alcohol use. never consumed alcohol Current work status. disabled Children. 3 Living situation. live with partner Number of flights of stairs before winded. 2-3 Marital status. divorced Tobacco use. never smoker Pain Contract. no Post-Surgical Plans. Home with daughter and granddaughter.   Medication History Aspirin Childrens (81MG  Tablet Chewable, Oral) Active. Furosemide (20MG  Tablet,  Oral) Active. GlyBURIDE (5MG  Tablet, Oral) Active. Hydrochlorothiazide (12.5MG  Tablet, Oral) Active. Levothyroxine Sodium ( Tablet, Oral) Active. Losartan Potassium (50MG  Tablet, Oral) Active. MetFORMIN HCl (500MG  Tablet, Oral) Active. Propranolol HCl (40MG  Tablet, Oral) Active. Januvia (100MG  Tablet, Oral) Active. Albuterol Sulfate ((2.5 MG/3ML)0.083% Nebulized Soln, Inhalation) Active. BuSpar (30MG  Tablet, Oral) Active. Oscal 500/200 D-3 (500-200MG -UNIT Tablet, Oral) Active. CeleXA (20MG  Tablet, Oral) Active. Flexeril (5MG  Tablet, Oral) Active. Flovent HFA (110MCG/ACT Aerosol, Inhalation) Active. Hydrocodone-Acetaminophen (2.5-325MG  Tablet, Oral) Active. Omeprazole (20MG  Capsule ER, Oral) Active. Potassium Chloride ER ( Capsule ER, Oral) Active. ProAir HFA (108 (90 Base)MCG/ACT Aerosol Soln, Inhalation) Active. Ultram (50MG  Tablet, Oral) Active. Kenalog (0.5% Cream, External) Active. Ambien CR (6.25MG  Tablet ER, Oral) Active.   Pregnancy Pregnant. no   Past Surgical History Valve Replacement. replaced: mitral Tubal Ligation. Date: 61.   Medical History Hypercholesterolemia Diabetes Mellitus, Type II Coronary artery disease High blood pressure Gastroesophageal Reflux Disease Chronic Obstructive Lung Disease Asthma   Review of Systems General:Not Present- Chills, Fever, Night Sweats, Fatigue, Weight Gain, Weight Loss and Memory Loss. Skin:Not Present- Hives, Itching, Rash, Eczema and Lesions. HEENT:Not Present- Tinnitus, Headache, Double Vision, Visual Loss, Hearing Loss and Dentures. Respiratory:Not Present- Shortness of breath with exertion, Shortness of breath at rest, Allergies, Coughing up blood and Chronic Cough. Cardiovascular:Not Present- Chest Pain, Racing/skipping heartbeats, Difficulty Breathing Lying Down, Murmur, Swelling and Palpitations. Gastrointestinal:Not Present- Bloody Stool, Heartburn, Abdominal Pain, Vomiting, Nausea,  Constipation, Diarrhea, Difficulty Swallowing, Jaundice and Loss of appetitie. Female Genitourinary:Not Present- Blood in Urine, Urinary frequency, Weak urinary stream, Discharge, Flank Pain, Incontinence, Painful Urination, Urgency, Urinary Retention and Urinating at Night. Musculoskeletal:Present- Joint Pain. Not Present- Muscle Weakness, Muscle Pain, Joint Swelling, Back Pain, Morning Stiffness and Spasms. Neurological:Not Present- Tremor, Dizziness, Blackout spells, Paralysis, Difficulty with balance and Weakness. Psychiatric:Not Present- Insomnia.   Vitals Pulse: 76 (Regular) Resp.: 12 (Unlabored) BP: 110/56 (Sitting, Left Arm, Standard)    Physical Exam The physical exam findings are as follows:   General Mental Status - Alert, cooperative and good historian. General Appearance- pleasant. Not in acute distress. Orientation- Oriented X3. Build & Nutrition- Well nourished and Well developed.   Head and Neck Head- normocephalic, atraumatic . Neck Global Assessment- supple. no bruit auscultated on the right and no bruit auscultated on the left.   Note: upper dentures  Eye Vision- Wears corrective lenses. Pupil- Bilateral- Regular and Round. Motion- Bilateral- EOMI.   Chest and Lung Exam Auscultation: Breath sounds:- clear at anterior chest wall and - clear at posterior chest wall. Adventitious sounds:- No Adventitious sounds. Pacemaker place left anterior upper medial chest wall.  Cardiovascular Auscultation:Rhythm- Regular rate and rhythm. Heart Sounds- S1 WNL and S2 WNL. Murmurs & Other Heart Sounds: Murmur 1:Location- Pulmonic Area and Sternal Border - Left. Timing- Mid-systolic. Grade- II/VI. Character- Low pitched.   Abdomen Palpation/Percussion:Tenderness- Abdomen is non-tender to palpation. Rigidity (guarding)- Abdomen is soft. Auscultation:Auscultation of the abdomen reveals - Bowel sounds normal.   Female  Genitourinary  Not done, not pertinent to present illness  Musculoskeletal She is a well developed female. She is alert and oriented. No apparent distress. The hips show a normal range of motion and no discomfort. The left knee shows no effusion. Range is 0-140. There is some crepitus on range of motion. There is no jointline tenderness or instability. The right knee is in slight varus. No effusion. Range is 5-130. Moderate crepitus on range of motion. There is tenderness medial greater than lateral with no instability noted. Pulses, sensation and motor  intact distally.  RADIOGRAPHS: AP of both knees and lateral show that the left knee is normal. The right knee has significant medial jointspace narrowing with subchondral sclerosis. She is not completely bone on bone medially but she is completely bone on bone patellofemoral.  Assessment & Plan Primary osteoarthritis of both knees (715.16)  Note: Plan is for a Right Total Knee Replacement by Dr. Lequita Halt.  Plan is to go home. Daughter and granddaughter will be with her at home.  Cardiology - Dr. Clarene Duke - Patient has been seen preoperatively and felt to be stable for surgery. She is low risk of CV complications. She is pacemaker dependent but TKA should not interfere with device function.  Signed electronically by Roberts Gaudy, PA-C

## 2012-10-25 ENCOUNTER — Encounter: Payer: Self-pay | Admitting: Family Medicine

## 2012-10-25 ENCOUNTER — Ambulatory Visit (INDEPENDENT_AMBULATORY_CARE_PROVIDER_SITE_OTHER): Payer: Medicare Other | Admitting: Family Medicine

## 2012-10-25 ENCOUNTER — Other Ambulatory Visit (HOSPITAL_COMMUNITY)
Admission: RE | Admit: 2012-10-25 | Discharge: 2012-10-25 | Disposition: A | Discharge status: Home / Self Care | Payer: Medicare Other | Source: Ambulatory Visit | Attending: Family Medicine | Admitting: Family Medicine

## 2012-10-25 VITALS — BP 108/72 | HR 96 | Temp 98.0°F | Ht 62.5 in | Wt 143.0 lb

## 2012-10-25 DIAGNOSIS — R102 Pelvic and perineal pain: Secondary | ICD-10-CM | POA: Insufficient documentation

## 2012-10-25 DIAGNOSIS — M25551 Pain in right hip: Secondary | ICD-10-CM

## 2012-10-25 DIAGNOSIS — Z113 Encounter for screening for infections with a predominantly sexual mode of transmission: Secondary | ICD-10-CM | POA: Insufficient documentation

## 2012-10-25 DIAGNOSIS — Z1151 Encounter for screening for human papillomavirus (HPV): Secondary | ICD-10-CM | POA: Insufficient documentation

## 2012-10-25 DIAGNOSIS — M25559 Pain in unspecified hip: Secondary | ICD-10-CM

## 2012-10-25 DIAGNOSIS — Z01419 Encounter for gynecological examination (general) (routine) without abnormal findings: Secondary | ICD-10-CM | POA: Insufficient documentation

## 2012-10-25 LAB — POCT WET PREP (WET MOUNT): Clue Cells Wet Prep Whiff POC: NEGATIVE

## 2012-10-25 NOTE — Progress Notes (Signed)
  Subjective:    Patient ID: Mercedes Barr, female    DOB: 1946/02/09, 67 y.o.   MRN: [id number]  HPI3 days of RLQ, pelvic pain.  First two days was intermittent sharp pain in RLQ/pelvis when standing from a chair or laying down from a bed and trying to get up.  Yesterday, started to get worse.  Intermittent. Ok when not moving.    No nausea, fever, diarrhea, no dysuria or polyuria, no vaginal discharge, not sexually active for past 8 years,  Later reports she did wipe some blood away on tissue a few days ago, feels it was from vagina.  I have reviewed patient's  PMH, FH, and Social history and Medications as related to this visit. History of diabetes.  History of tubal ligation in 1974 and pacemaker surgery and open heart surgery in 1988.   Last pap 2009  Review of Systems See HPI    Objective:   Physical Exam GEN: NAD Abdomen:  Tender in right lower pelvis, states it radiates to her right side of mons.  unabel to appreciate hernia on standing or laying exam but difficult exam due to pain.  Pelvic exam very tender to vaginal exam.  Unable to palpate ovary due to patient pain. No vaginal discharge MSK:  Pain when moving right leg, but no pain with walking.  Does not seem hip or back is involved.     Assessment & Plan:

## 2012-10-25 NOTE — Patient Instructions (Addendum)
I will call you when i get results of test and we will discuss   Ok to use your other pain medicines for pain  If you notice severe pain or other concerns, please call office

## 2012-10-25 NOTE — Assessment & Plan Note (Signed)
Diff dx to include hernia vs ovarian etiology.  Cultures and pap obtained today.  Less concern for hip or back etiology given walking does not seem to exacerbate this.  Patient prefers to wait until tomorrow for ultrasound.  Will evaluate for hernia, ovarian etiology, endometrium given Postmenopausal bleeding.  Will discuss results with patient by phone.  She will take tramadol and hydrocodone for pain as she has some from chronic knee pain.  Advised if worsening pain, to go to ER

## 2012-10-26 ENCOUNTER — Ambulatory Visit (HOSPITAL_COMMUNITY)
Admission: RE | Admit: 2012-10-26 | Discharge: 2012-10-26 | Disposition: A | Discharge status: Home / Self Care | Payer: Medicare Other | Source: Ambulatory Visit | Attending: Family Medicine | Admitting: Family Medicine

## 2012-10-26 ENCOUNTER — Telehealth: Payer: Self-pay | Admitting: Family Medicine

## 2012-10-26 DIAGNOSIS — R102 Pelvic and perineal pain: Secondary | ICD-10-CM

## 2012-10-26 DIAGNOSIS — M25551 Pain in right hip: Secondary | ICD-10-CM

## 2012-10-26 DIAGNOSIS — N950 Postmenopausal bleeding: Secondary | ICD-10-CM | POA: Insufficient documentation

## 2012-10-26 DIAGNOSIS — D252 Subserosal leiomyoma of uterus: Secondary | ICD-10-CM | POA: Insufficient documentation

## 2012-10-26 NOTE — Assessment & Plan Note (Addendum)
Reviewed results of pelvic ultrasound with patient.  Shows some fibroids, 1 mm endometrial echo, right ovary not visualized.  Patient reports pain somehwat improved, continued soreness with pain medicine.  States transvaginal exam was more comfortable than when i examined her the day before.  Denies any new symptoms such as dysuria, fever, emesis, nasuea, flank pain, hematuria.  Discussed with patient approach to care- given is improving, she would like to wait on further diagnostic testing.  Will see her in office for recheck, and decide on need for CT abdpelvis/labwork at that time.  Highest on differential are hernia, right ovarian etiology.  PID, MSK given history of normal gait and nto sexually active in 8 years.  Advised to go to ER over the weekend if pain worsens.

## 2012-10-26 NOTE — Telephone Encounter (Signed)
See problem list pelvic pain for plan discussed with patient

## 2012-10-30 ENCOUNTER — Ambulatory Visit: Payer: Medicare Other | Admitting: Family Medicine

## 2012-10-31 ENCOUNTER — Encounter: Payer: Self-pay | Admitting: Family Medicine

## 2012-11-01 ENCOUNTER — Other Ambulatory Visit: Payer: Self-pay | Admitting: Orthopedic Surgery

## 2012-11-01 MED ORDER — BUPIVACAINE LIPOSOME 1.3 % IJ SUSP: 20.0000 mL | Freq: Once | INTRAMUSCULAR | Status: DC

## 2012-11-01 MED ORDER — DEXAMETHASONE SODIUM PHOSPHATE 10 MG/ML IJ SOLN: 10.0000 mg | Freq: Once | INTRAMUSCULAR | Status: DC

## 2012-11-01 NOTE — Progress Notes (Signed)
Preoperative surgical orders have been place into the Epic hospital system for Mercedes Barr on 11/01/2012, 11:28 AM  by Patrica Duel for surgery on 11/19/2012.  Preop Total Knee orders including Experal, IV Tylenol, and IV Decadron as long as there are no contraindications to the above medications. Avel Peace, PA-C

## 2012-11-06 DIAGNOSIS — M1990 Unspecified osteoarthritis, unspecified site: Secondary | ICD-10-CM

## 2012-11-06 HISTORY — DX: Unspecified osteoarthritis, unspecified site: M19.90

## 2012-11-07 ENCOUNTER — Other Ambulatory Visit: Payer: Self-pay | Admitting: *Deleted

## 2012-11-08 MED ORDER — ALBUTEROL SULFATE HFA 108 (90 BASE) MCG/ACT IN AERS: 2.0000 | INHALATION_SPRAY | RESPIRATORY_TRACT | Status: DC | PRN

## 2012-11-09 ENCOUNTER — Encounter (HOSPITAL_COMMUNITY): Payer: Self-pay | Admitting: Pharmacy Technician

## 2012-11-12 ENCOUNTER — Ambulatory Visit (HOSPITAL_COMMUNITY)
Admission: RE | Admit: 2012-11-12 | Discharge: 2012-11-12 | Disposition: A | Discharge status: Home / Self Care | Payer: Medicare Other | Source: Ambulatory Visit | Attending: Orthopedic Surgery | Admitting: Orthopedic Surgery

## 2012-11-12 ENCOUNTER — Encounter (HOSPITAL_COMMUNITY): Payer: Self-pay

## 2012-11-12 ENCOUNTER — Encounter (HOSPITAL_COMMUNITY)
Admission: RE | Admit: 2012-11-12 | Discharge: 2012-11-12 | Disposition: A | Discharge status: Home / Self Care | Payer: Medicare Other | Source: Ambulatory Visit | Attending: Orthopedic Surgery | Admitting: Orthopedic Surgery

## 2012-11-12 DIAGNOSIS — I10 Essential (primary) hypertension: Secondary | ICD-10-CM | POA: Insufficient documentation

## 2012-11-12 DIAGNOSIS — E119 Type 2 diabetes mellitus without complications: Secondary | ICD-10-CM | POA: Insufficient documentation

## 2012-11-12 DIAGNOSIS — Z01812 Encounter for preprocedural laboratory examination: Secondary | ICD-10-CM | POA: Insufficient documentation

## 2012-11-12 DIAGNOSIS — M1710 Unilateral primary osteoarthritis, unspecified knee: Secondary | ICD-10-CM | POA: Insufficient documentation

## 2012-11-12 DIAGNOSIS — Z950 Presence of cardiac pacemaker: Secondary | ICD-10-CM | POA: Insufficient documentation

## 2012-11-12 DIAGNOSIS — Z01818 Encounter for other preprocedural examination: Secondary | ICD-10-CM | POA: Insufficient documentation

## 2012-11-12 DIAGNOSIS — Z79899 Other long term (current) drug therapy: Secondary | ICD-10-CM | POA: Insufficient documentation

## 2012-11-12 DIAGNOSIS — K219 Gastro-esophageal reflux disease without esophagitis: Secondary | ICD-10-CM | POA: Insufficient documentation

## 2012-11-12 DIAGNOSIS — I4891 Unspecified atrial fibrillation: Secondary | ICD-10-CM | POA: Insufficient documentation

## 2012-11-12 HISTORY — DX: Cardiac murmur, unspecified: R01.1

## 2012-11-12 HISTORY — DX: Hypothyroidism, unspecified: E03.9

## 2012-11-12 HISTORY — DX: Presence of cardiac pacemaker: Z95.0

## 2012-11-12 HISTORY — DX: Chronic obstructive pulmonary disease, unspecified: J44.9

## 2012-11-12 HISTORY — DX: Gastro-esophageal reflux disease without esophagitis: K21.9

## 2012-11-12 HISTORY — DX: Unspecified osteoarthritis, unspecified site: M19.90

## 2012-11-12 LAB — URINALYSIS, ROUTINE W REFLEX MICROSCOPIC
Ketones, ur: 40 mg/dL — AB
Leukocytes, UA: NEGATIVE
Nitrite: NEGATIVE
Specific Gravity, Urine: 1.039 — ABNORMAL HIGH (ref 1.005–1.030)
Urobilinogen, UA: 1 mg/dL (ref 0.0–1.0)
pH: 5.5 (ref 5.0–8.0)

## 2012-11-12 LAB — COMPREHENSIVE METABOLIC PANEL
ALT: 26 U/L (ref 0–35)
AST: 23 U/L (ref 0–37)
CO2: 32 mEq/L (ref 19–32)
Chloride: 98 mEq/L (ref 96–112)
GFR calc non Af Amer: 86 mL/min — ABNORMAL LOW (ref >90)
Sodium: 140 mEq/L (ref 135–145)
Total Bilirubin: 0.3 mg/dL (ref 0.3–1.2)

## 2012-11-12 LAB — CBC
Platelets: 253 10*3/uL (ref 150–400)
RBC: 4.94 MIL/uL (ref 3.87–5.11)
WBC: 5.2 10*3/uL (ref 4.0–10.5)

## 2012-11-12 LAB — URINE MICROSCOPIC-ADD ON

## 2012-11-12 LAB — SURGICAL PCR SCREEN
MRSA, PCR: NEGATIVE
Staphylococcus aureus: NEGATIVE

## 2012-11-12 NOTE — Pre-Procedure Instructions (Signed)
EKG AND CXR WERE DONE TODAY AT [facility] PREOP. PERIOPERATIVE PRESCRIPTION FOR IMPLANTED CARDIAC DEVICE PROGRAMMING ON PT'S CHART FROM DR. CROITORU - PROCEDURE SHOULD NOT INTERFERE WITH PACEMAKER FUNCTION - NO DEVICE REPROGRAMMING OR MAGNET PLACEMENT NEEDED.

## 2012-11-12 NOTE — Patient Instructions (Signed)
YOUR SURGERY IS SCHEDULED AT [facility]  ON:  Monday  4/14  REPORT TO [place] [facility] AT:  6:25 AM      PHONE # FOR SHORT STAY IS [phone]  DO NOT EAT OR DRINK ANYTHING AFTER MIDNIGHT THE NIGHT BEFORE YOUR SURGERY.  YOU MAY BRUSH YOUR TEETH, RINSE OUT YOUR MOUTH--BUT NO WATER, NO FOOD, NO CHEWING GUM, NO MINTS, NO CANDIES, NO CHEWING TOBACCO.  PLEASE TAKE THE FOLLOWING MEDICATIONS THE AM OF YOUR SURGERY WITH A FEW SIPS OF WATER:  CITALOPRAM, HYDROCODONE / ACETAMINOPHEN IF NEEDED FOR PAIN, LEVOTHYROXINE, PROPRANOLOL, OMEPRAZOLE.  USE YOUR NEBULIZER.  USE YOUR ALBUTEROL AND YOUR FLOVENT INHALERS.  IF YOU USE INHALERS--USE YOUR INHALERS THE AM OF YOUR SURGERY AND BRING INHALERS TO THE HOSPITAL.    IF YOU ARE DIABETIC:  DO NOT TAKE ANY DIABETIC MEDICATIONS THE AM OF YOUR SURGERY.  IF YOU TAKE INSULIN IN THE EVENINGS--PLEASE ONLY TAKE 1/2 NORMAL EVENING DOSE THE NIGHT BEFORE YOUR SURGERY.  NO INSULIN THE AM OF YOUR SURGERY.  IF YOU HAVE SLEEP APNEA AND USE CPAP OR BIPAP--PLEASE BRING THE MASK AND THE TUBING.  DO NOT BRING YOUR MACHINE.  DO NOT BRING VALUABLES, MONEY, CREDIT CARDS.  DO NOT WEAR JEWELRY, MAKE-UP, NAIL POLISH AND NO METAL PINS OR CLIPS IN YOUR HAIR. CONTACT LENS, DENTURES / PARTIALS, GLASSES SHOULD NOT BE WORN TO SURGERY AND IN MOST CASES-HEARING AIDS WILL NEED TO BE REMOVED.  BRING YOUR GLASSES CASE, ANY EQUIPMENT NEEDED FOR YOUR CONTACT LENS. FOR PATIENTS ADMITTED TO THE HOSPITAL--CHECK OUT TIME THE DAY OF DISCHARGE IS 11:00 AM.  ALL INPATIENT ROOMS ARE PRIVATE - WITH BATHROOM, TELEPHONE, TELEVISION AND WIFI INTERNET.                               PLEASE READ OVER ANY  FACT SHEETS THAT YOU WERE GIVEN: MRSA INFORMATION, BLOOD TRANSFUSION INFORMATION, INCENTIVE SPIROMETER INFORMATION. FAILURE TO FOLLOW THESE INSTRUCTIONS MAY RESULT IN THE CANCELLATION OF YOUR SURGERY.   PATIENT SIGNATURE_________________________________

## 2012-11-12 NOTE — Pre-Procedure Instructions (Signed)
2 D ECHO REPORT 10/24/12, LAST PACEMAKER INTERROGATION REPORT 09/13/12, LAST CARDIOLOGY OFFICE NOTE 03/07/12 DR. A. LITTLE  ON PT'S CHART FROM [facility].

## 2012-11-13 ENCOUNTER — Other Ambulatory Visit: Payer: Self-pay | Admitting: Family Medicine

## 2012-11-13 MED ORDER — ALBUTEROL SULFATE HFA 108 (90 BASE) MCG/ACT IN AERS: 2.0000 | INHALATION_SPRAY | RESPIRATORY_TRACT | Status: DC | PRN

## 2012-11-13 NOTE — Pre-Procedure Instructions (Signed)
PT'S PREOP GLUCOSE 260, URINE GLUCOSE >1000, KETONES 40.  COPIES OF CMET AND URINALYSIS AND URINE MICROSCOPIC AND URINE CULTURE REPORTS FAXED TO DR. ALUISIO'S OFFICE TODAY.

## 2012-11-13 NOTE — Telephone Encounter (Signed)
Faxed form for albuterol refill to [facility]

## 2012-11-14 ENCOUNTER — Other Ambulatory Visit: Payer: Self-pay | Admitting: Family Medicine

## 2012-11-14 LAB — URINE CULTURE: Colony Count: >=100000

## 2012-11-15 ENCOUNTER — Other Ambulatory Visit: Payer: Self-pay | Admitting: Family Medicine

## 2012-11-15 MED ORDER — ALBUTEROL SULFATE HFA 108 (90 BASE) MCG/ACT IN AERS: 2.0000 | INHALATION_SPRAY | RESPIRATORY_TRACT | Status: DC | PRN

## 2012-11-15 NOTE — Pre-Procedure Instructions (Signed)
PT'S PREOP URINE CULTURE REPORT FAXED TO DR. ALUISIO'S OFFICE.

## 2012-11-15 NOTE — Pre-Procedure Instructions (Signed)
FAXED NOTE RECEIVED FROM DR. ALUISIO'S OFFICE THAT PT WAS CALLED TO START LEAVAQUIN .

## 2012-11-16 ENCOUNTER — Telehealth: Payer: Self-pay | Admitting: Family Medicine

## 2012-11-16 NOTE — Telephone Encounter (Signed)
Patient is requesting an order for a Roller Walker to be sent to [facility] in [place].  She spoke to [facility] and they told her that her doctor would have to write an Rx for one.  The phone # for [facility] is [phone].

## 2012-11-18 ENCOUNTER — Other Ambulatory Visit: Payer: Self-pay | Admitting: Orthopedic Surgery

## 2012-11-18 NOTE — H&P (Signed)
Mercedes Barr  DOB: 05/06/1946 Divorced / Language: English / Race: Black or African American Female  Date of Admission:  11/19/2012  Chief Complaint:  Right Knee Pain  History of Present Illness The patient is a 66 year old female who comes in for a preoperative History and Physical. The patient is scheduled for a right total knee arthroplasty to be performed by Dr. Frank V. Aluisio, MD at [facility] on 11/19/12. The patient is a 66 year old female who presents with knee complaints. The patient was seen for a second opinion. The patient reports left knee and right knee symptoms including: pain, swelling, catching (popping) and stiffness . The patient has the current diagnosis of knee osteoarthritis. Prior to being seen, the patient was previously evaluated by a colleague. Previous work-up for this problem has included knee x-rays and arthroscopy (on the right 09/30/08 by Dr. Arthur Carter). She states the right knee is the worst. She states the left knee is not currently giving her a problem. She did well for about a year and a half after her surgery. She started having pain with weightbearing after that period. Since then she has been progressively worse. She has gotten to the point that she has pain at rest now and a lot of trouble with stiffness if she sits for a long period of time. She has had a cortisone injection in the right knee several months ago which helped for a month. She has never had viscosupplementation. She reports that Dr. Carter told her that she needs a total knee replacement but she wanted to hear that from another physician before having the surgery done. She states the knee is getting progressively worse over time. She did have a cortisone and visco supplement injections without much benefit a few years ago. If anything she is much worse now than she was then. She is at a stage where the knee is preventing her from doing things she desires and she is  hurting all the time. She is ready to proceed with surgery at this time. They have been treated conservatively in the past for the above stated problem and despite conservative measures, they continue to have progressive pain and severe functional limitations and dysfunction. They have failed non-operative management including home exercise, medications, and injections. It is felt that they would benefit from undergoing total joint replacement. Risks and benefits of the procedure have been discussed with the patient and they elect to proceed with surgery. There are no active contraindications to surgery such as ongoing infection or rapidly progressive neurological disease.   Problem List Primary osteoarthritis of both knees (715.16)   Allergies Amitriptyline HCl *ANTIDEPRESSANTS*. Itching, Nausea. Tylox *ANALGESICS - OPIOID*. Itching. Benazepril HCl *ANTIHYPERTENSIVES* Naproxen *ANALGESICS - ANTI-INFLAMMATORY* Propoxyphene-Acetaminophen *ANALGESICS - OPIOID*   Family History( Drug / Alcohol Addiction. brother Diabetes Mellitus. sister and brother Heart disease in female family member before age 65 Heart Disease. mother Cerebrovascular Accident. father Depression. sister Congestive Heart Failure. mother Hypertension. mother, father, sister, brother and grandfather mothers side Liver Disease, Chronic. brother Kidney disease. sister   Social History Drug/Alcohol Rehab (Previously). no Drug/Alcohol Rehab (Currently). no Illicit drug use. no Exercise. Exercises rarely; does other Alcohol use. never consumed alcohol Current work status. disabled Children. 3 Living situation. live with partner Number of flights of stairs before winded. 2-3 Marital status. divorced Tobacco use. never smoker Pain Contract. no Post-Surgical Plans. Home with daughter and granddaughter.   Medication History Aspirin Childrens (81MG Tablet Chewable, Oral)    Active. Furosemide (20MG Tablet, Oral) Active. GlyBURIDE (5MG Tablet, Oral) Active. Hydrochlorothiazide (12.5MG Tablet, Oral) Active. Levothyroxine Sodium (75MCG Tablet, Oral) Active. Losartan Potassium (50MG Tablet, Oral) Active. MetFORMIN HCl (500MG Tablet, Oral) Active. Propranolol HCl (40MG Tablet, Oral) Active. Januvia (100MG Tablet, Oral) Active. Albuterol Sulfate ((2.5 MG/3ML)0.083% Nebulized Soln, Inhalation) Active. BuSpar (30MG Tablet, Oral) Active. Oscal 500/200 D-3 (500-200MG-UNIT Tablet, Oral) Active. CeleXA (20MG Tablet, Oral) Active. Flexeril (5MG Tablet, Oral) Active. Flovent HFA (110MCG/ACT Aerosol, Inhalation) Active. Hydrocodone-Acetaminophen (2.5-325MG Tablet, Oral) Active. Omeprazole (20MG Capsule ER, Oral) Active. Potassium Chloride ER (10MEQ Capsule ER, Oral) Active. ProAir HFA (108 (90 Base)MCG/ACT Aerosol Soln, Inhalation) Active. Ultram (50MG Tablet, Oral) Active. Kenalog (0.5% Cream, External) Active. Ambien CR (6.25MG Tablet ER, Oral) Active.   Past Surgical History Valve Replacement. replaced: mitral Tubal Ligation. Date: 1974.   Medical History Hypercholesterolemia Diabetes Mellitus, Type II Coronary artery disease High blood pressure Gastroesophageal Reflux Disease Chronic Obstructive Lung Disease Asthma   Review of Systems General:Not Present- Chills, Fever, Night Sweats, Fatigue, Weight Gain, Weight Loss and Memory Loss. Skin:Not Present- Hives, Itching, Rash, Eczema and Lesions. HEENT:Not Present- Tinnitus, Headache, Double Vision, Visual Loss, Hearing Loss and Dentures. Respiratory:Not Present- Shortness of breath with exertion, Shortness of breath at rest, Allergies, Coughing up blood and Chronic Cough. Cardiovascular:Not Present- Chest Pain, Racing/skipping heartbeats, Difficulty Breathing Lying Down, Murmur, Swelling and Palpitations. Gastrointestinal:Not Present- Bloody Stool, Heartburn, Abdominal Pain, Vomiting, Nausea,  Constipation, Diarrhea, Difficulty Swallowing, Jaundice and Loss of appetitie. Female Genitourinary:Not Present- Blood in Urine, Urinary frequency, Weak urinary stream, Discharge, Flank Pain, Incontinence, Painful Urination, Urgency, Urinary Retention and Urinating at Night. Musculoskeletal:Present- Joint Pain. Not Present- Muscle Weakness, Muscle Pain, Joint Swelling, Back Pain, Morning Stiffness and Spasms. Neurological:Not Present- Tremor, Dizziness, Blackout spells, Paralysis, Difficulty with balance and Weakness. Psychiatric:Not Present- Insomnia.   Vitals Pulse: 76 (Regular) Resp.: 12 (Unlabored) BP: 110/56 (Sitting, Left Arm, Standard)    Physical Exam The physical exam findings are as follows:   General Mental Status - Alert, cooperative and good historian. General Appearance- pleasant. Not in acute distress. Orientation- Oriented X3. Build & Nutrition- Well nourished and Well developed.   Head and Neck Head- normocephalic, atraumatic . Neck Global Assessment- supple. no bruit auscultated on the right and no bruit auscultated on the left.   Note: upper dentures  Eye Vision- Wears corrective lenses. Pupil- Bilateral- Regular and Round. Motion- Bilateral- EOMI.   Chest and Lung Exam Auscultation: Breath sounds:- clear at anterior chest wall and - clear at posterior chest wall. Adventitious sounds:- No Adventitious sounds. Pacemaker place left anterior upper medial chest wall.  Cardiovascular Auscultation:Rhythm- Regular rate and rhythm. Heart Sounds- S1 WNL and S2 WNL. Murmurs & Other Heart Sounds: Murmur 1:Location- Pulmonic Area and Sternal Border - Left. Timing- Mid-systolic. Grade- II/VI. Character- Low pitched.   Abdomen Palpation/Percussion:Tenderness- Abdomen is non-tender to palpation. Rigidity (guarding)- Abdomen is soft. Auscultation:Auscultation of the abdomen reveals - Bowel sounds normal.   Female  Genitourinary Not done, not pertinent to present illness  Musculoskeletal She is a well developed female. She is alert and oriented. No apparent distress. The hips show a normal range of motion and no discomfort. The left knee shows no effusion. Range is 0-140. There is some crepitus on range of motion. There is no jointline tenderness or instability. The right knee is in slight varus. No effusion. Range is 5-130. Moderate crepitus on range of motion. There is tenderness medial greater than lateral with no instability noted. Pulses, sensation and motor intact distally.    RADIOGRAPHS: AP of both knees and lateral show that the left knee is normal. The right knee has significant medial jointspace narrowing with subchondral sclerosis. She is not completely bone on bone medially but she is completely bone on bone patellofemoral.  Assessment & Plan Primary osteoarthritis of both knees (715.16)  Note: Plan is for a Right Total Knee Replacement by Dr. Aluisio.  Plan is to go home. Daughter and granddaughter will be with her at home.  Cardiology - Dr. Little - Patient has been seen preoperatively and felt to be stable for surgery. She is low risk of CV complications. She is pacemaker dependent but TKA should not interfere with device function.  Signed electronically by DREW L PERKINS, PA-C  

## 2012-11-19 ENCOUNTER — Telehealth: Payer: Self-pay | Admitting: Family Medicine

## 2012-11-19 ENCOUNTER — Encounter (HOSPITAL_COMMUNITY): Payer: Self-pay

## 2012-11-19 ENCOUNTER — Encounter (HOSPITAL_COMMUNITY)
Admission: RE | Disposition: A | Discharge status: Home Health | Payer: Self-pay | Source: Ambulatory Visit | Attending: Orthopedic Surgery

## 2012-11-19 ENCOUNTER — Encounter (HOSPITAL_COMMUNITY): Payer: Self-pay | Admitting: Anesthesiology

## 2012-11-19 ENCOUNTER — Other Ambulatory Visit: Payer: Self-pay

## 2012-11-19 ENCOUNTER — Inpatient Hospital Stay (HOSPITAL_COMMUNITY): Payer: Medicare Other | Admitting: Anesthesiology

## 2012-11-19 ENCOUNTER — Inpatient Hospital Stay (HOSPITAL_COMMUNITY)
Admission: RE | Admit: 2012-11-19 | Discharge: 2012-11-23 | DRG: 470 | Disposition: A | Discharge status: Home Health | Payer: Medicare Other | Source: Ambulatory Visit | Attending: Orthopedic Surgery | Admitting: Orthopedic Surgery

## 2012-11-19 DIAGNOSIS — E876 Hypokalemia: Secondary | ICD-10-CM | POA: Diagnosis not present

## 2012-11-19 DIAGNOSIS — M1710 Unilateral primary osteoarthritis, unspecified knee: Principal | ICD-10-CM | POA: Diagnosis present

## 2012-11-19 DIAGNOSIS — Z9289 Personal history of other medical treatment: Secondary | ICD-10-CM

## 2012-11-19 DIAGNOSIS — E119 Type 2 diabetes mellitus without complications: Secondary | ICD-10-CM | POA: Diagnosis present

## 2012-11-19 DIAGNOSIS — R112 Nausea with vomiting, unspecified: Secondary | ICD-10-CM | POA: Diagnosis not present

## 2012-11-19 DIAGNOSIS — D62 Acute posthemorrhagic anemia: Secondary | ICD-10-CM

## 2012-11-19 DIAGNOSIS — R42 Dizziness and giddiness: Secondary | ICD-10-CM | POA: Diagnosis not present

## 2012-11-19 DIAGNOSIS — K219 Gastro-esophageal reflux disease without esophagitis: Secondary | ICD-10-CM | POA: Diagnosis present

## 2012-11-19 DIAGNOSIS — J449 Chronic obstructive pulmonary disease, unspecified: Secondary | ICD-10-CM | POA: Diagnosis present

## 2012-11-19 DIAGNOSIS — J4489 Other specified chronic obstructive pulmonary disease: Secondary | ICD-10-CM | POA: Diagnosis present

## 2012-11-19 DIAGNOSIS — E039 Hypothyroidism, unspecified: Secondary | ICD-10-CM | POA: Diagnosis present

## 2012-11-19 DIAGNOSIS — I4891 Unspecified atrial fibrillation: Secondary | ICD-10-CM | POA: Diagnosis present

## 2012-11-19 DIAGNOSIS — Z950 Presence of cardiac pacemaker: Secondary | ICD-10-CM

## 2012-11-19 HISTORY — PX: TOTAL KNEE ARTHROPLASTY: SHX125

## 2012-11-19 LAB — GLUCOSE, CAPILLARY
Glucose-Capillary: 177 mg/dL — ABNORMAL HIGH (ref 70–99)
Glucose-Capillary: 229 mg/dL — ABNORMAL HIGH (ref 70–99)

## 2012-11-19 SURGERY — ARTHROPLASTY, KNEE, TOTAL
Anesthesia: Spinal | Site: Knee | Laterality: Right | Wound class: Clean

## 2012-11-19 MED ORDER — NITROGLYCERIN 0.4 MG SL SUBL
0.4000 mg | SUBLINGUAL_TABLET | SUBLINGUAL | Status: DC | PRN
  Administered 2012-11-19: 0.4 mg via SUBLINGUAL

## 2012-11-19 MED ORDER — LOSARTAN POTASSIUM 50 MG PO TABS
50.0000 mg | ORAL_TABLET | Freq: Every morning | ORAL | Status: DC
  Administered 2012-11-19 – 2012-11-23 (×5): 50 mg via ORAL
  Filled 2012-11-19 (×5): qty 1

## 2012-11-19 MED ORDER — HYDROCHLOROTHIAZIDE 12.5 MG PO CAPS
12.5000 mg | ORAL_CAPSULE | Freq: Every morning | ORAL | Status: DC
  Administered 2012-11-19 – 2012-11-23 (×4): 12.5 mg via ORAL
  Filled 2012-11-19 (×5): qty 1

## 2012-11-19 MED ORDER — INSULIN ASPART 100 UNIT/ML ~~LOC~~ SOLN
0.0000 [IU] | Freq: Three times a day (TID) | SUBCUTANEOUS | Status: DC
  Administered 2012-11-19: 3 [IU] via SUBCUTANEOUS
  Administered 2012-11-20: 8 [IU] via SUBCUTANEOUS
  Administered 2012-11-20 (×2): 5 [IU] via SUBCUTANEOUS
  Administered 2012-11-21 – 2012-11-23 (×7): 3 [IU] via SUBCUTANEOUS

## 2012-11-19 MED ORDER — FLEET ENEMA 7-19 GM/118ML RE ENEM: 1.0000 | ENEMA | Freq: Once | RECTAL | Status: AC | PRN

## 2012-11-19 MED ORDER — ALUM HYDROXIDE-MAG CARBONATE 95-358 MG/15ML PO SUSP: 30.0000 mL | Freq: Once | ORAL | Status: DC

## 2012-11-19 MED ORDER — RIVAROXABAN 10 MG PO TABS
10.0000 mg | ORAL_TABLET | Freq: Every day | ORAL | Status: DC
  Administered 2012-11-20 – 2012-11-23 (×4): 10 mg via ORAL
  Filled 2012-11-19 (×6): qty 1

## 2012-11-19 MED ORDER — FUROSEMIDE 20 MG PO TABS
20.0000 mg | ORAL_TABLET | Freq: Every morning | ORAL | Status: DC
  Administered 2012-11-19 – 2012-11-23 (×5): 20 mg via ORAL
  Filled 2012-11-19 (×5): qty 1

## 2012-11-19 MED ORDER — PROMETHAZINE HCL 25 MG/ML IJ SOLN: 6.2500 mg | INTRAMUSCULAR | Status: DC | PRN

## 2012-11-19 MED ORDER — SODIUM CHLORIDE 0.9 % IV SOLN: INTRAVENOUS | Status: DC

## 2012-11-19 MED ORDER — POTASSIUM CHLORIDE 20 MEQ PO PACK
20.0000 meq | PACK | Freq: Every day | ORAL | Status: DC
  Filled 2012-11-19: qty 1

## 2012-11-19 MED ORDER — LACTATED RINGERS IV SOLN: INTRAVENOUS | Status: DC

## 2012-11-19 MED ORDER — ALBUTEROL SULFATE HFA 108 (90 BASE) MCG/ACT IN AERS
2.0000 | INHALATION_SPRAY | RESPIRATORY_TRACT | Status: DC | PRN
Start: 2012-11-19 — End: 2012-11-23

## 2012-11-19 MED ORDER — LINAGLIPTIN 5 MG PO TABS
5.0000 mg | ORAL_TABLET | Freq: Every day | ORAL | Status: DC
  Administered 2012-11-19 – 2012-11-23 (×5): 5 mg via ORAL
  Filled 2012-11-19 (×5): qty 1

## 2012-11-19 MED ORDER — CITALOPRAM HYDROBROMIDE 20 MG PO TABS
20.0000 mg | ORAL_TABLET | Freq: Every morning | ORAL | Status: DC
  Administered 2012-11-20 – 2012-11-23 (×4): 20 mg via ORAL
  Filled 2012-11-19 (×4): qty 1

## 2012-11-19 MED ORDER — LOSARTAN POTASSIUM 50 MG PO TABS: 50.0000 mg | ORAL_TABLET | Freq: Every day | ORAL | Status: DC

## 2012-11-19 MED ORDER — BUPIVACAINE HCL (PF) 0.75 % IJ SOLN
INTRAMUSCULAR | Status: DC | PRN
  Administered 2012-11-19: 13.5 mg

## 2012-11-19 MED ORDER — ONDANSETRON HCL 4 MG PO TABS
4.0000 mg | ORAL_TABLET | Freq: Four times a day (QID) | ORAL | Status: DC | PRN
  Filled 2012-11-19: qty 1

## 2012-11-19 MED ORDER — ACETAMINOPHEN 325 MG PO TABS: 650.0000 mg | ORAL_TABLET | Freq: Four times a day (QID) | ORAL | Status: DC | PRN

## 2012-11-19 MED ORDER — POTASSIUM CHLORIDE IN NACL 20-0.9 MEQ/L-% IV SOLN
INTRAVENOUS | Status: DC
Start: 2012-11-19 — End: 2012-11-21
  Administered 2012-11-19 – 2012-11-20 (×2): via INTRAVENOUS
  Filled 2012-11-19 (×3): qty 1000

## 2012-11-19 MED ORDER — DIPHENHYDRAMINE HCL 12.5 MG/5ML PO ELIX
12.5000 mg | ORAL_SOLUTION | ORAL | Status: DC | PRN
Start: 2012-11-19 — End: 2012-11-23

## 2012-11-19 MED ORDER — MORPHINE SULFATE 2 MG/ML IJ SOLN
1.0000 mg | INTRAMUSCULAR | Status: DC | PRN
  Administered 2012-11-19: 2 mg via INTRAVENOUS
  Administered 2012-11-19: 1 mg via INTRAVENOUS
  Administered 2012-11-20 (×6): 2 mg via INTRAVENOUS
  Filled 2012-11-19 (×8): qty 1

## 2012-11-19 MED ORDER — PHENYLEPHRINE HCL 10 MG/ML IJ SOLN
INTRAMUSCULAR | Status: DC | PRN
  Administered 2012-11-19 (×4): 40 ug via INTRAVENOUS

## 2012-11-19 MED ORDER — ACETAMINOPHEN 10 MG/ML IV SOLN
1000.0000 mg | Freq: Four times a day (QID) | INTRAVENOUS | Status: AC
  Administered 2012-11-19 – 2012-11-20 (×4): 1000 mg via INTRAVENOUS
  Filled 2012-11-19 (×4): qty 100

## 2012-11-19 MED ORDER — FENTANYL CITRATE 0.05 MG/ML IJ SOLN
INTRAMUSCULAR | Status: DC | PRN
  Administered 2012-11-19 (×4): 25 ug via INTRAVENOUS

## 2012-11-19 MED ORDER — BISACODYL 10 MG RE SUPP: 10.0000 mg | Freq: Every day | RECTAL | Status: DC | PRN

## 2012-11-19 MED ORDER — CEFAZOLIN SODIUM 1-5 GM-% IV SOLN
1.0000 g | Freq: Four times a day (QID) | INTRAVENOUS | Status: AC
  Administered 2012-11-19 (×2): 1 g via INTRAVENOUS
  Filled 2012-11-19 (×4): qty 50

## 2012-11-19 MED ORDER — PROPOFOL INFUSION 10 MG/ML OPTIME
INTRAVENOUS | Status: DC | PRN
  Administered 2012-11-19: 50 ug/kg/min via INTRAVENOUS

## 2012-11-19 MED ORDER — LEVOTHYROXINE SODIUM 75 MCG PO TABS
75.0000 ug | ORAL_TABLET | Freq: Every day | ORAL | Status: DC
  Administered 2012-11-20 – 2012-11-23 (×4): 75 ug via ORAL
  Filled 2012-11-19 (×5): qty 1

## 2012-11-19 MED ORDER — SODIUM CHLORIDE 0.9 % IV SOLN: INTRAVENOUS | Status: DC | PRN

## 2012-11-19 MED ORDER — GLYBURIDE 5 MG PO TABS
10.0000 mg | ORAL_TABLET | Freq: Two times a day (BID) | ORAL | Status: DC
  Administered 2012-11-19 – 2012-11-23 (×8): 10 mg via ORAL
  Filled 2012-11-19 (×10): qty 2

## 2012-11-19 MED ORDER — METOCLOPRAMIDE HCL 10 MG PO TABS: 5.0000 mg | ORAL_TABLET | Freq: Three times a day (TID) | ORAL | Status: DC | PRN

## 2012-11-19 MED ORDER — PROPRANOLOL HCL 40 MG PO TABS
40.0000 mg | ORAL_TABLET | Freq: Two times a day (BID) | ORAL | Status: DC
  Administered 2012-11-19 – 2012-11-23 (×8): 40 mg via ORAL
  Filled 2012-11-19 (×9): qty 1

## 2012-11-19 MED ORDER — METHOCARBAMOL 500 MG PO TABS
500.0000 mg | ORAL_TABLET | Freq: Four times a day (QID) | ORAL | Status: DC | PRN
  Administered 2012-11-19 – 2012-11-22 (×6): 500 mg via ORAL
  Filled 2012-11-19 (×6): qty 1

## 2012-11-19 MED ORDER — ONDANSETRON HCL 4 MG/2ML IJ SOLN
4.0000 mg | Freq: Four times a day (QID) | INTRAMUSCULAR | Status: DC | PRN
  Administered 2012-11-20: 4 mg via INTRAVENOUS
  Filled 2012-11-19: qty 2

## 2012-11-19 MED ORDER — FLUTICASONE PROPIONATE HFA 110 MCG/ACT IN AERO
1.0000 | INHALATION_SPRAY | Freq: Two times a day (BID) | RESPIRATORY_TRACT | Status: DC
  Administered 2012-11-19 – 2012-11-23 (×8): 1 via RESPIRATORY_TRACT
  Filled 2012-11-19 (×2): qty 12

## 2012-11-19 MED ORDER — ZOLPIDEM TARTRATE 5 MG PO TABS
5.0000 mg | ORAL_TABLET | Freq: Every evening | ORAL | Status: DC | PRN
Start: 2012-11-19 — End: 2012-11-23
  Administered 2012-11-19 – 2012-11-20 (×2): 5 mg via ORAL
  Filled 2012-11-19 (×2): qty 1

## 2012-11-19 MED ORDER — POLYETHYLENE GLYCOL 3350 17 G PO PACK
17.0000 g | PACK | Freq: Every day | ORAL | Status: DC | PRN
  Filled 2012-11-19: qty 1

## 2012-11-19 MED ORDER — METOCLOPRAMIDE HCL 5 MG/ML IJ SOLN: 5.0000 mg | Freq: Three times a day (TID) | INTRAMUSCULAR | Status: DC | PRN

## 2012-11-19 MED ORDER — POTASSIUM CHLORIDE CRYS ER 20 MEQ PO TBCR
20.0000 meq | EXTENDED_RELEASE_TABLET | Freq: Every day | ORAL | Status: DC
  Administered 2012-11-19: 20 meq via ORAL
  Filled 2012-11-19 (×2): qty 1

## 2012-11-19 MED ORDER — METFORMIN HCL 500 MG PO TABS
1000.0000 mg | ORAL_TABLET | Freq: Two times a day (BID) | ORAL | Status: DC
  Administered 2012-11-19 – 2012-11-23 (×8): 1000 mg via ORAL
  Filled 2012-11-19 (×10): qty 2

## 2012-11-19 MED ORDER — DEXAMETHASONE 6 MG PO TABS
10.0000 mg | ORAL_TABLET | Freq: Once | ORAL | Status: AC
  Administered 2012-11-20: 10 mg via ORAL
  Filled 2012-11-19: qty 1

## 2012-11-19 MED ORDER — FENTANYL CITRATE 0.05 MG/ML IJ SOLN: 25.0000 ug | INTRAMUSCULAR | Status: DC | PRN

## 2012-11-19 MED ORDER — PANTOPRAZOLE SODIUM 40 MG PO TBEC
40.0000 mg | DELAYED_RELEASE_TABLET | Freq: Every day | ORAL | Status: DC
  Administered 2012-11-20: 40 mg via ORAL
  Filled 2012-11-19: qty 1

## 2012-11-19 MED ORDER — ALBUTEROL SULFATE (5 MG/ML) 0.5% IN NEBU: 2.5000 mg | INHALATION_SOLUTION | RESPIRATORY_TRACT | Status: DC | PRN

## 2012-11-19 MED ORDER — DOCUSATE SODIUM 100 MG PO CAPS
100.0000 mg | ORAL_CAPSULE | Freq: Two times a day (BID) | ORAL | Status: DC
  Administered 2012-11-19 – 2012-11-23 (×9): 100 mg via ORAL
  Filled 2012-11-19 (×6): qty 1

## 2012-11-19 MED ORDER — MORPHINE SULFATE 10 MG/ML IJ SOLN
2.0000 mg | INTRAMUSCULAR | Status: DC | PRN
  Administered 2012-11-19 (×2): 2 mg via INTRAVENOUS

## 2012-11-19 MED ORDER — ACETAMINOPHEN 650 MG RE SUPP: 650.0000 mg | Freq: Four times a day (QID) | RECTAL | Status: DC | PRN

## 2012-11-19 MED ORDER — PHENOL 1.4 % MT LIQD: 1.0000 | OROMUCOSAL | Status: DC | PRN

## 2012-11-19 MED ORDER — BUPIVACAINE LIPOSOME 1.3 % IJ SUSP
20.0000 mL | Freq: Once | INTRAMUSCULAR | Status: DC
  Filled 2012-11-19: qty 20

## 2012-11-19 MED ORDER — SODIUM CHLORIDE 0.9 % IR SOLN
Status: DC | PRN
  Administered 2012-11-19: 3000 mL

## 2012-11-19 MED ORDER — DEXAMETHASONE SODIUM PHOSPHATE 10 MG/ML IJ SOLN
10.0000 mg | Freq: Once | INTRAMUSCULAR | Status: AC
  Filled 2012-11-19: qty 1

## 2012-11-19 MED ORDER — METHOCARBAMOL 100 MG/ML IJ SOLN: 500.0000 mg | Freq: Four times a day (QID) | INTRAVENOUS | Status: DC | PRN

## 2012-11-19 MED ORDER — SODIUM CHLORIDE 0.9 % IJ SOLN
INTRAMUSCULAR | Status: DC | PRN
  Administered 2012-11-19: 10:00:00

## 2012-11-19 MED ORDER — CHLORHEXIDINE GLUCONATE 4 % EX LIQD
60.0000 mL | Freq: Once | CUTANEOUS | Status: DC
  Filled 2012-11-19: qty 60

## 2012-11-19 MED ORDER — CEFAZOLIN SODIUM-DEXTROSE 2-3 GM-% IV SOLR
2.0000 g | INTRAVENOUS | Status: AC
  Administered 2012-11-19: 2 g via INTRAVENOUS

## 2012-11-19 MED ORDER — TRAMADOL HCL 50 MG PO TABS
50.0000 mg | ORAL_TABLET | Freq: Four times a day (QID) | ORAL | Status: DC | PRN
  Administered 2012-11-20 – 2012-11-21 (×2): 50 mg via ORAL
  Administered 2012-11-22 – 2012-11-23 (×4): 100 mg via ORAL
  Filled 2012-11-19: qty 1
  Filled 2012-11-19 (×4): qty 2

## 2012-11-19 MED ORDER — ACETAMINOPHEN 10 MG/ML IV SOLN
1000.0000 mg | Freq: Once | INTRAVENOUS | Status: AC
  Administered 2012-11-19: 1000 mg via INTRAVENOUS

## 2012-11-19 MED ORDER — MIDAZOLAM HCL 5 MG/5ML IJ SOLN
INTRAMUSCULAR | Status: DC | PRN
  Administered 2012-11-19: 2 mg via INTRAVENOUS

## 2012-11-19 MED ORDER — LACTATED RINGERS IV SOLN
INTRAVENOUS | Status: DC | PRN
  Administered 2012-11-19 (×2): via INTRAVENOUS

## 2012-11-19 MED ORDER — TRAVOPROST (BAK FREE) 0.004 % OP SOLN
1.0000 [drp] | Freq: Every day | OPHTHALMIC | Status: DC
  Administered 2012-11-19 – 2012-11-21 (×3): 1 [drp] via OPHTHALMIC
  Filled 2012-11-19: qty 2.5

## 2012-11-19 MED ORDER — OXYCODONE HCL 5 MG PO TABS
5.0000 mg | ORAL_TABLET | ORAL | Status: DC | PRN
  Administered 2012-11-19: 10 mg via ORAL
  Administered 2012-11-20 (×2): 5 mg via ORAL
  Administered 2012-11-20 – 2012-11-21 (×3): 10 mg via ORAL
  Filled 2012-11-19 (×3): qty 2
  Filled 2012-11-19 (×2): qty 1
  Filled 2012-11-19: qty 2

## 2012-11-19 MED ORDER — ALUM & MAG HYDROXIDE-SIMETH 200-200-20 MG/5ML PO SUSP
30.0000 mL | Freq: Once | ORAL | Status: AC
  Administered 2012-11-19: 30 mL via ORAL
  Filled 2012-11-19: qty 30

## 2012-11-19 MED ORDER — MENTHOL 3 MG MT LOZG: 1.0000 | LOZENGE | OROMUCOSAL | Status: DC | PRN

## 2012-11-19 SURGICAL SUPPLY — 58 items
BAG ZIPLOCK 12X15 (MISCELLANEOUS) ×2 IMPLANT
BANDAGE ELASTIC 6 VELCRO ST LF (GAUZE/BANDAGES/DRESSINGS) ×2 IMPLANT
BANDAGE ESMARK 6X9 LF (GAUZE/BANDAGES/DRESSINGS) ×1 IMPLANT
BLADE SAG 18X100X1.27 (BLADE) ×2 IMPLANT
BLADE SAW SGTL 11.0X1.19X90.0M (BLADE) ×2 IMPLANT
BNDG ESMARK 6X9 LF (GAUZE/BANDAGES/DRESSINGS) ×2
BOWL SMART MIX CTS (DISPOSABLE) ×2 IMPLANT
CEMENT HV SMART SET (Cement) ×4 IMPLANT
CLOTH BEACON ORANGE TIMEOUT ST (SAFETY) ×2 IMPLANT
CUFF TOURN SGL QUICK 34 (TOURNIQUET CUFF) ×1
CUFF TRNQT CYL 34X4X40X1 (TOURNIQUET CUFF) ×1 IMPLANT
DRAPE EXTREMITY T 121X128X90 (DRAPE) ×2 IMPLANT
DRAPE POUCH INSTRU U-SHP 10X18 (DRAPES) ×2 IMPLANT
DRAPE U-SHAPE 47X51 STRL (DRAPES) ×2 IMPLANT
DRSG ADAPTIC 3X8 NADH LF (GAUZE/BANDAGES/DRESSINGS) ×2 IMPLANT
DRSG PAD ABDOMINAL 8X10 ST (GAUZE/BANDAGES/DRESSINGS) ×2 IMPLANT
DURAPREP 26ML APPLICATOR (WOUND CARE) ×2 IMPLANT
ELECT REM PT RETURN 9FT ADLT (ELECTROSURGICAL) ×2
ELECTRODE REM PT RTRN 9FT ADLT (ELECTROSURGICAL) ×1 IMPLANT
EVACUATOR 1/8 PVC DRAIN (DRAIN) ×2 IMPLANT
FACESHIELD LNG OPTICON STERILE (SAFETY) ×10 IMPLANT
GLOVE BIO SURGEON STRL SZ7.5 (GLOVE) ×2 IMPLANT
GLOVE BIO SURGEON STRL SZ8 (GLOVE) ×2 IMPLANT
GLOVE BIOGEL PI IND STRL 6.5 (GLOVE) ×2 IMPLANT
GLOVE BIOGEL PI IND STRL 7.0 (GLOVE) ×2 IMPLANT
GLOVE BIOGEL PI IND STRL 8 (GLOVE) ×2 IMPLANT
GLOVE BIOGEL PI INDICATOR 6.5 (GLOVE) ×2
GLOVE BIOGEL PI INDICATOR 7.0 (GLOVE) ×2
GLOVE BIOGEL PI INDICATOR 8 (GLOVE) ×2
GLOVE SURG SS PI 6.5 STRL IVOR (GLOVE) ×4 IMPLANT
GOWN STRL NON-REIN LRG LVL3 (GOWN DISPOSABLE) ×4 IMPLANT
GOWN STRL REIN XL XLG (GOWN DISPOSABLE) ×2 IMPLANT
HANDPIECE INTERPULSE COAX TIP (DISPOSABLE) ×1
IMMOBILIZER KNEE 20 (SOFTGOODS) ×2
IMMOBILIZER KNEE 20 THIGH 36 (SOFTGOODS) ×1 IMPLANT
KIT BASIN OR (CUSTOM PROCEDURE TRAY) ×2 IMPLANT
MANIFOLD NEPTUNE II (INSTRUMENTS) ×2 IMPLANT
NDL SAFETY ECLIPSE 18X1.5 (NEEDLE) ×1 IMPLANT
NEEDLE HYPO 18GX1.5 SHARP (NEEDLE) ×1
NEEDLE SPNL 20GX3.5 QUINCKE YW (NEEDLE) ×2 IMPLANT
NS IRRIG 1000ML POUR BTL (IV SOLUTION) ×2 IMPLANT
PACK TOTAL JOINT (CUSTOM PROCEDURE TRAY) ×2 IMPLANT
PAD ABD 7.5X8 STRL (GAUZE/BANDAGES/DRESSINGS) ×2 IMPLANT
PADDING CAST COTTON 6X4 STRL (CAST SUPPLIES) ×2 IMPLANT
POSITIONER SURGICAL ARM (MISCELLANEOUS) ×2 IMPLANT
SET HNDPC FAN SPRY TIP SCT (DISPOSABLE) ×1 IMPLANT
SPONGE GAUZE 4X4 12PLY (GAUZE/BANDAGES/DRESSINGS) ×2 IMPLANT
STRIP CLOSURE SKIN 1/2X4 (GAUZE/BANDAGES/DRESSINGS) ×4 IMPLANT
SUCTION FRAZIER 12FR DISP (SUCTIONS) ×2 IMPLANT
SUT MNCRL AB 4-0 PS2 18 (SUTURE) ×2 IMPLANT
SUT VIC AB 2-0 CT1 27 (SUTURE) ×3
SUT VIC AB 2-0 CT1 TAPERPNT 27 (SUTURE) ×3 IMPLANT
SUT VLOC 180 0 24IN GS25 (SUTURE) ×2 IMPLANT
SYR 50ML LL SCALE MARK (SYRINGE) ×2 IMPLANT
TOWEL OR 17X26 10 PK STRL BLUE (TOWEL DISPOSABLE) ×4 IMPLANT
TRAY FOLEY CATH 14FRSI W/METER (CATHETERS) ×2 IMPLANT
WATER STERILE IRR 1500ML POUR (IV SOLUTION) ×2 IMPLANT
WRAP KNEE MAXI GEL POST OP (GAUZE/BANDAGES/DRESSINGS) ×4 IMPLANT

## 2012-11-19 NOTE — Addendum Note (Signed)
Addendum created 11/19/12 1216 by Paris Lore, CRNA   Modules edited: Anesthesia Blocks and Procedures

## 2012-11-19 NOTE — Anesthesia Postprocedure Evaluation (Signed)
Anesthesia Post Note  Patient: Mercedes Barr  Procedure(s) Performed: Procedure(s) (LRB): RIGHT TOTAL KNEE ARTHROPLASTY (Right)  Anesthesia type: Spinal  Patient location: PACU  Post pain: Pain level controlled  Post assessment: Post-op Vital signs reviewed  Last Vitals:  Filed Vitals:   11/19/12 1145  BP: 100/83  Pulse: 67  Temp: 36.3 C  Resp: 23    Post vital signs: Reviewed  Level of consciousness: sedated  Complications: No apparent anesthesia complications

## 2012-11-19 NOTE — Progress Notes (Signed)
PT Cancellation Note  Patient Details Name: Mercedes Barr MRN: [id number] DOB: 12-Oct-1945   Cancelled Treatment:    Reason Eval/Treat Not Completed: Pain limiting ability to participate. Pt reported too much pain today, would prefer to begin PT session tomorrow. Educated patient to participate in dangling at EOB with nursing this evening.    [name] 11/19/2012, 4:24 PM

## 2012-11-19 NOTE — Anesthesia Preprocedure Evaluation (Signed)
Anesthesia Evaluation  Patient identified by MRN, date of birth, ID band Patient awake    Reviewed: Allergy & Precautions, H&P , NPO status , Patient's Chart, lab work & pertinent test results  Airway Mallampati: II TM Distance: >3 FB Neck ROM: Full    Dental  (+) Edentulous Upper and Dental Advisory Given   Pulmonary COPD breath sounds clear to auscultation  Pulmonary exam normal       Cardiovascular hypertension, Pt. on home beta blockers and Pt. on medications + dysrhythmias Atrial Fibrillation + pacemaker (Patient rate is pacemaker dependent) + Valvular Problems/Murmurs Rhythm:Regular Rate:Normal     Neuro/Psych Depression negative neurological ROS  negative psych ROS   GI/Hepatic Neg liver ROS, GERD-  Medicated,  Endo/Other  diabetes, Poorly Controlled, Type 2, Oral Hypoglycemic AgentsHypothyroidism   Renal/GU negative Renal ROS  negative genitourinary   Musculoskeletal negative musculoskeletal ROS (+)   Abdominal   Peds  Hematology negative hematology ROS (+)   Anesthesia Other Findings   Reproductive/Obstetrics negative OB ROS                           Anesthesia Physical Anesthesia Plan  ASA: III  Anesthesia Plan: Spinal   Post-op Pain Management:    Induction: Intravenous  Airway Management Planned: Simple Face Mask  Additional Equipment:   Intra-op Plan:   Post-operative Plan:   Informed Consent: I have reviewed the patients History and Physical, chart, labs and discussed the procedure including the risks, benefits and alternatives for the proposed anesthesia with the patient or authorized representative who has indicated his/her understanding and acceptance.   Dental advisory given  Plan Discussed with: CRNA  Anesthesia Plan Comments:         Anesthesia Quick Evaluation

## 2012-11-19 NOTE — Transfer of Care (Signed)
Immediate Anesthesia Transfer of Care Note  Patient: Mercedes Barr  Procedure(s) Performed: Procedure(s): RIGHT TOTAL KNEE ARTHROPLASTY (Right)  Patient Location: PACU  Anesthesia Type:Spinal  Level of Consciousness: awake and patient cooperative  Airway & Oxygen Therapy: Patient Spontanous Breathing and Patient connected to face mask oxygen  Post-op Assessment: Report given to PACU RN, Post -op Vital signs reviewed and stable and SAB level T12.  Post vital signs: Reviewed and stable  Complications: No apparent anesthesia complications

## 2012-11-19 NOTE — Care Management (Signed)
[facility] is following this pt.  Thank You Venia Minks  [phone]

## 2012-11-19 NOTE — H&P (View-Only) (Signed)
Mercedes Barr  DOB: 01/24/1946 Divorced / Language: English / Race: Black or African American Female  Date of Admission:  11/19/2012  Chief Complaint:  Right Knee Pain  History of Present Illness The patient is a 67 year old female who comes in for a preoperative History and Physical. The patient is scheduled for a right total knee arthroplasty to be performed by Dr. Gus Rankin. Aluisio, MD at [facility] on 11/19/12. The patient is a 67 year old female who presents with knee complaints. The patient was seen for a second opinion. The patient reports left knee and right knee symptoms including: pain, swelling, catching (popping) and stiffness . The patient has the current diagnosis of knee osteoarthritis. Prior to being seen, the patient was previously evaluated by a colleague. Previous work-up for this problem has included knee x-rays and arthroscopy (on the right 09/30/08 by Dr. Myrtie Neither). She states the right knee is the worst. She states the left knee is not currently giving her a problem. She did well for about a year and a half after her surgery. She started having pain with weightbearing after that period. Since then she has been progressively worse. She has gotten to the point that she has pain at rest now and a lot of trouble with stiffness if she sits for a long period of time. She has had a cortisone injection in the right knee several months ago which helped for a month. She has never had viscosupplementation. She reports that Dr. Montez Morita told her that she needs a total knee replacement but she wanted to hear that from another physician before having the surgery done. She states the knee is getting progressively worse over time. She did have a cortisone and visco supplement injections without much benefit a few years ago. If anything she is much worse now than she was then. She is at a stage where the knee is preventing her from doing things she desires and she is  hurting all the time. She is ready to proceed with surgery at this time. They have been treated conservatively in the past for the above stated problem and despite conservative measures, they continue to have progressive pain and severe functional limitations and dysfunction. They have failed non-operative management including home exercise, medications, and injections. It is felt that they would benefit from undergoing total joint replacement. Risks and benefits of the procedure have been discussed with the patient and they elect to proceed with surgery. There are no active contraindications to surgery such as ongoing infection or rapidly progressive neurological disease.   Problem List Primary osteoarthritis of both knees (715.16)   Allergies Amitriptyline HCl *ANTIDEPRESSANTS*. Itching, Nausea. Tylox *ANALGESICS - OPIOID*. Itching. Benazepril HCl *ANTIHYPERTENSIVES* Naproxen *ANALGESICS - ANTI-INFLAMMATORY* Propoxyphene-Acetaminophen *ANALGESICS - OPIOID*   Family History( Drug / Alcohol Addiction. brother Diabetes Mellitus. sister and brother Heart disease in female family member before age 40 Heart Disease. mother Cerebrovascular Accident. father Depression. sister Congestive Heart Failure. mother Hypertension. mother, father, sister, brother and grandfather mothers side Liver Disease, Chronic. brother Kidney disease. sister   Social History Drug/Alcohol Rehab (Previously). no Drug/Alcohol Rehab (Currently). no Illicit drug use. no Exercise. Exercises rarely; does other Alcohol use. never consumed alcohol Current work status. disabled Children. 3 Living situation. live with partner Number of flights of stairs before winded. 2-3 Marital status. divorced Tobacco use. never smoker Pain Contract. no Post-Surgical Plans. Home with daughter and granddaughter.   Medication History Aspirin Childrens (81MG  Tablet Chewable, Oral)  Active. Furosemide (20MG  Tablet, Oral) Active. GlyBURIDE (5MG  Tablet, Oral) Active. Hydrochlorothiazide (12.5MG  Tablet, Oral) Active. Levothyroxine Sodium ( Tablet, Oral) Active. Losartan Potassium (50MG  Tablet, Oral) Active. MetFORMIN HCl (500MG  Tablet, Oral) Active. Propranolol HCl (40MG  Tablet, Oral) Active. Januvia (100MG  Tablet, Oral) Active. Albuterol Sulfate ((2.5 MG/3ML)0.083% Nebulized Soln, Inhalation) Active. BuSpar (30MG  Tablet, Oral) Active. Oscal 500/200 D-3 (500-200MG -UNIT Tablet, Oral) Active. CeleXA (20MG  Tablet, Oral) Active. Flexeril (5MG  Tablet, Oral) Active. Flovent HFA (110MCG/ACT Aerosol, Inhalation) Active. Hydrocodone-Acetaminophen (2.5-325MG  Tablet, Oral) Active. Omeprazole (20MG  Capsule ER, Oral) Active. Potassium Chloride ER ( Capsule ER, Oral) Active. ProAir HFA (108 (90 Base)MCG/ACT Aerosol Soln, Inhalation) Active. Ultram (50MG  Tablet, Oral) Active. Kenalog (0.5% Cream, External) Active. Ambien CR (6.25MG  Tablet ER, Oral) Active.   Past Surgical History Valve Replacement. replaced: mitral Tubal Ligation. Date: 21.   Medical History Hypercholesterolemia Diabetes Mellitus, Type II Coronary artery disease High blood pressure Gastroesophageal Reflux Disease Chronic Obstructive Lung Disease Asthma   Review of Systems General:Not Present- Chills, Fever, Night Sweats, Fatigue, Weight Gain, Weight Loss and Memory Loss. Skin:Not Present- Hives, Itching, Rash, Eczema and Lesions. HEENT:Not Present- Tinnitus, Headache, Double Vision, Visual Loss, Hearing Loss and Dentures. Respiratory:Not Present- Shortness of breath with exertion, Shortness of breath at rest, Allergies, Coughing up blood and Chronic Cough. Cardiovascular:Not Present- Chest Pain, Racing/skipping heartbeats, Difficulty Breathing Lying Down, Murmur, Swelling and Palpitations. Gastrointestinal:Not Present- Bloody Stool, Heartburn, Abdominal Pain, Vomiting, Nausea,  Constipation, Diarrhea, Difficulty Swallowing, Jaundice and Loss of appetitie. Female Genitourinary:Not Present- Blood in Urine, Urinary frequency, Weak urinary stream, Discharge, Flank Pain, Incontinence, Painful Urination, Urgency, Urinary Retention and Urinating at Night. Musculoskeletal:Present- Joint Pain. Not Present- Muscle Weakness, Muscle Pain, Joint Swelling, Back Pain, Morning Stiffness and Spasms. Neurological:Not Present- Tremor, Dizziness, Blackout spells, Paralysis, Difficulty with balance and Weakness. Psychiatric:Not Present- Insomnia.   Vitals Pulse: 76 (Regular) Resp.: 12 (Unlabored) BP: 110/56 (Sitting, Left Arm, Standard)    Physical Exam The physical exam findings are as follows:   General Mental Status - Alert, cooperative and good historian. General Appearance- pleasant. Not in acute distress. Orientation- Oriented X3. Build & Nutrition- Well nourished and Well developed.   Head and Neck Head- normocephalic, atraumatic . Neck Global Assessment- supple. no bruit auscultated on the right and no bruit auscultated on the left.   Note: upper dentures  Eye Vision- Wears corrective lenses. Pupil- Bilateral- Regular and Round. Motion- Bilateral- EOMI.   Chest and Lung Exam Auscultation: Breath sounds:- clear at anterior chest wall and - clear at posterior chest wall. Adventitious sounds:- No Adventitious sounds. Pacemaker place left anterior upper medial chest wall.  Cardiovascular Auscultation:Rhythm- Regular rate and rhythm. Heart Sounds- S1 WNL and S2 WNL. Murmurs & Other Heart Sounds: Murmur 1:Location- Pulmonic Area and Sternal Border - Left. Timing- Mid-systolic. Grade- II/VI. Character- Low pitched.   Abdomen Palpation/Percussion:Tenderness- Abdomen is non-tender to palpation. Rigidity (guarding)- Abdomen is soft. Auscultation:Auscultation of the abdomen reveals - Bowel sounds normal.   Female  Genitourinary Not done, not pertinent to present illness  Musculoskeletal She is a well developed female. She is alert and oriented. No apparent distress. The hips show a normal range of motion and no discomfort. The left knee shows no effusion. Range is 0-140. There is some crepitus on range of motion. There is no jointline tenderness or instability. The right knee is in slight varus. No effusion. Range is 5-130. Moderate crepitus on range of motion. There is tenderness medial greater than lateral with no instability noted. Pulses, sensation and motor intact distally.  RADIOGRAPHS: AP of both knees and lateral show that the left knee is normal. The right knee has significant medial jointspace narrowing with subchondral sclerosis. She is not completely bone on bone medially but she is completely bone on bone patellofemoral.  Assessment & Plan Primary osteoarthritis of both knees (715.16)  Note: Plan is for a Right Total Knee Replacement by Dr. Lequita Halt.  Plan is to go home. Daughter and granddaughter will be with her at home.  Cardiology - Dr. Clarene Duke - Patient has been seen preoperatively and felt to be stable for surgery. She is low risk of CV complications. She is pacemaker dependent but TKA should not interfere with device function.  Signed electronically by Roberts Gaudy, PA-C

## 2012-11-19 NOTE — Interval H&P Note (Signed)
History and Physical Interval Note:  11/19/2012 7:00 AM  Mercedes Barr  has presented today for surgery, with the diagnosis of OSTEOARTHRITIS RIGHT KNEE  The various methods of treatment have been discussed with the patient and family. After consideration of risks, benefits and other options for treatment, the patient has consented to  Procedure(s): RIGHT TOTAL KNEE ARTHROPLASTY (Right) as a surgical intervention .  The patient's history has been reviewed, patient examined, no change in status, stable for surgery.  I have reviewed the patient's chart and labs.  Questions were answered to the patient's satisfaction.     Loanne Drilling

## 2012-11-19 NOTE — Telephone Encounter (Signed)
Mercedes Barr also need to have the cream prescribed before for itching sent to her pharmacy as well.

## 2012-11-19 NOTE — Anesthesia Procedure Notes (Addendum)
Spinal  Patient location during procedure: OR Start time: 11/19/2012 9:25 AM End time: 11/19/2012 9:35 AM Staffing CRNA/Resident: Paris Lore Performed by: resident/CRNA  Preanesthetic Checklist Completed: patient identified, site marked, surgical consent, pre-op evaluation, timeout performed, IV checked, risks and benefits discussed and monitors and equipment checked Spinal Block Patient position: sitting Prep: Betadine Patient monitoring: heart rate, continuous pulse ox and blood pressure Approach: right paramedian Location: L3-4 Injection technique: single-shot Needle Needle type: Spinocan  Needle gauge: 22 G Needle length: 9 cm Needle insertion depth: 4 cm Assessment Sensory level: T6 Additional Notes Expiration date of kit checked and confirmed. Patient tolerated procedure well, without complications. One attempt with noted CSF return easy aspiration, medication administrated without incidences noted T6 level on exam.

## 2012-11-19 NOTE — Op Note (Signed)
Pre-operative diagnosis- Osteoarthritis  Right knee(s)  Post-operative diagnosis- Osteoarthritis Right knee(s)  Procedure-  Right  Total Knee Arthroplasty  Surgeon- Gus Rankin. Clark Clowdus, MD  Assistant- Dimitri Ped, PA-C   Anesthesia-  Spinal EBL-* No blood loss amount entered *  Drains Hemovac  Tourniquet time-  Total Tourniquet Time Documented: Thigh (Right) - 31 minutes Total: Thigh (Right) - 31 minutes    Complications- None  Condition-PACU - hemodynamically stable.   Brief Clinical Note  Mercedes Barr is a 67 y.o. year old female with end stage OA of her right knee with progressively worsening pain and dysfunction. She has constant pain, with activity and at rest and significant functional deficits with difficulties even with ADLs. She has had extensive non-op management including analgesics, injections of cortisone  and home exercise program, but remains in significant pain with significant dysfunction.Radiographs show bone on bone arthritis lateral. She presents now for right Total Knee Arthroplasty.    Procedure in detail---   The patient is brought into the operating room and positioned supine on the operating table. After successful administration of  Spinal,   a tourniquet is placed high on the  Right thigh(s) and the lower extremity is prepped and draped in the usual sterile fashion. Time out is performed by the operating team and then the  Right lower extremity is wrapped in Esmarch, knee flexed and the tourniquet inflated to 300 mmHg.       A midline incision is made with a ten blade through the subcutaneous tissue to the level of the extensor mechanism. A fresh blade is used to make a medial parapatellar arthrotomy. Soft tissue over the proximal medial tibia is subperiosteally elevated to the joint line with a knife and into the semimembranosus bursa with a Cobb elevator. Soft tissue over the proximal lateral tibia is elevated with attention being paid to avoiding the  patellar tendon on the tibial tubercle. The patella is everted, knee flexed 90 degrees and the ACL and PCL are removed. Findings are exposed bone lateral and patellofemoral with large global osteophytes.        The drill is used to create a starting hole in the distal femur and the canal is thoroughly irrigated with sterile saline to remove the fatty contents. The 5 degree Right  valgus alignment guide is placed into the femoral canal and the distal femoral cutting block is pinned to remove 10 mm off the distal femur. Resection is made with an oscillating saw.      The tibia is subluxed forward and the menisci are removed. The extramedullary alignment guide is placed referencing proximally at the medial aspect of the tibial tubercle and distally along the second metatarsal axis and tibial crest. The block is pinned to remove 2mm off the more deficient medial  side. Resection is made with an oscillating saw. Size 2.5 is the most appropriate size for the tibia and the proximal tibia is prepared with the modular drill and keel punch for that size.      The femoral sizing guide is placed and size 3 is most appropriate. Rotation is marked off the epicondylar axis and confirmed by creating a rectangular flexion gap at 90 degrees. The size 3 cutting block is pinned in this rotation and the anterior, posterior and chamfer cuts are made with the oscillating saw. The intercondylar block is then placed and that cut is made.      Trial size 2.5 tibial component, trial size 3 posterior stabilized femur and  a 10  mm posterior stabilized rotating platform insert trial is placed. Full extension is achieved with excellent varus/valgus and anterior/posterior balance throughout full range of motion. The patella is everted and thickness measured to be 22  mm. Free hand resection is taken to 12 mm, a 35 template is placed, lug holes are drilled, trial patella is placed, and it tracks normally. Osteophytes are removed off the  posterior femur with the trial in place. All trials are removed and the cut bone surfaces prepared with pulsatile lavage. Cement is mixed and once ready for implantation, the size 2.5 tibial implant, size  3 posterior stabilized femoral component, and the size 35 patella are cemented in place and the patella is held with the clamp. The trial insert is placed and the knee held in full extension. The Exparel (20 ml mixed with 50 ml saline) is injected into the extensor mechanism, posterior capsule, medial and lateral gutters and subcutaneous tissues.  All extruded cement is removed and once the cement is hard the permanent 10 mm posterior stabilized rotating platform insert is placed into the tibial tray.      The wound is copiously irrigated with saline solution and the extensor mechanism closed over a hemovac drain with #1 PDS suture. The tourniquet is released for a total tourniquet time of 31  minutes. Flexion against gravity is 140 degrees and the patella tracks normally. Subcutaneous tissue is closed with 2.0 vicryl and subcuticular with running 4.0 Monocryl. The incision is cleaned and dried and steri-strips and a bulky sterile dressing are applied. The limb is placed into a knee immobilizer and the patient is awakened and transported to recovery in stable condition.      Please note that a surgical assistant was a medical necessity for this procedure in order to perform it in a safe and expeditious manner. Surgical assistant was necessary to retract the ligaments and vital neurovascular structures to prevent injury to them and also necessary for proper positioning of the limb to allow for anatomic placement of the prosthesis.   Gus Rankin Marcea Rojek, MD    11/19/2012, 10:23 AM

## 2012-11-19 NOTE — Progress Notes (Signed)
   CARE MANAGEMENT NOTE 11/19/2012  Patient:  Mercedes Barr, Mercedes Barr   Account Number:  [id number]  Date Initiated:  11/19/2012  Documentation initiated by:  Jiles Crocker  Subjective/Objective Assessment:   ADMITTED FOR SURGERY - Right  Total Knee Arthroplasty     Action/Plan:   PCP IS DR CHAMBERLAIN; LIVES WITH FAMILY MEMBERS; HHC IS ARRANGED WITH [facility] FOR [facility] CARE SERVICES AT DISCHARGE   Anticipated DC Date:  11/22/2012   Anticipated DC Plan:  HOME W HOME HEALTH SERVICES      Status of service:  In process, will continue to follow Medicare Important Message given?  NA - LOS <3 / Initial given by admissions (If response is "NO", the following Medicare IM given date fields will be blank) Per UR Regulation:  Reviewed for med. necessity/level of care/duration of stay Comments:  11/19/2012- B Hamid Brookens RN,BSN,MHA

## 2012-11-20 ENCOUNTER — Encounter (HOSPITAL_COMMUNITY): Payer: Self-pay | Admitting: Orthopedic Surgery

## 2012-11-20 LAB — CBC
HCT: 31.6 % — ABNORMAL LOW (ref 36.0–46.0)
MCV: 82.5 fL (ref 78.0–100.0)
Platelets: 226 10*3/uL (ref 150–400)
RBC: 3.83 MIL/uL — ABNORMAL LOW (ref 3.87–5.11)
WBC: 10.1 10*3/uL (ref 4.0–10.5)

## 2012-11-20 LAB — GLUCOSE, CAPILLARY
Glucose-Capillary: 228 mg/dL — ABNORMAL HIGH (ref 70–99)
Glucose-Capillary: 204 mg/dL — ABNORMAL HIGH (ref 70–99)

## 2012-11-20 LAB — BASIC METABOLIC PANEL
BUN: 8 mg/dL (ref 6–23)
CO2: 31 mEq/L (ref 19–32)
Chloride: 99 mEq/L (ref 96–112)
Creatinine, Ser: 0.78 mg/dL (ref 0.50–1.10)
Potassium: 3 mEq/L — ABNORMAL LOW (ref 3.5–5.1)

## 2012-11-20 MED ORDER — OMEPRAZOLE 20 MG PO CPDR
20.0000 mg | DELAYED_RELEASE_CAPSULE | Freq: Two times a day (BID) | ORAL | Status: DC
  Administered 2012-11-20 – 2012-11-23 (×7): 20 mg via ORAL
  Filled 2012-11-20 (×9): qty 1

## 2012-11-20 MED ORDER — NON FORMULARY
20.0000 mg | Freq: Two times a day (BID) | Status: DC
Start: 2012-11-20 — End: 2012-11-20

## 2012-11-20 MED ORDER — POTASSIUM CHLORIDE CRYS ER 20 MEQ PO TBCR
20.0000 meq | EXTENDED_RELEASE_TABLET | Freq: Three times a day (TID) | ORAL | Status: AC
  Administered 2012-11-20 (×3): 20 meq via ORAL
  Filled 2012-11-20 (×2): qty 1

## 2012-11-20 MED ORDER — TRIAMCINOLONE ACETONIDE 0.5 % EX CREA: 1.0000 "application " | TOPICAL_CREAM | Freq: Two times a day (BID) | CUTANEOUS | Status: DC | PRN

## 2012-11-20 NOTE — Progress Notes (Signed)
   Subjective: 1 Day Post-Op Procedure(s) (LRB): RIGHT TOTAL KNEE ARTHROPLASTY (Right) Patient reports pain as moderate last night. Patient seen in rounds with Dr. Lequita Halt.  Little better this morning. Patient is having problems with pain in the knee, requiring pain medications We will start therapy today.  Plan is to go Home after hospital stay.  Objective: Vital signs in last 24 hours: Temp:  [96.5 F (35.8 C)-98.3 F (36.8 C)] 98.3 F (36.8 C) (04/15 0444) Pulse Rate:  [67-78] 75 (04/15 0444) Resp:  [12-28] 12 (04/15 0732) BP: (100-133)/(55-83) 120/61 mmHg (04/15 0444) SpO2:  [98 %-100 %] 98 % (04/15 0820) Weight:  [60.782 kg (134 lb)] 60.782 kg (134 lb) (04/14 1331)  Intake/Output from previous day:  Intake/Output Summary (Last 24 hours) at 11/20/12 1009 Last data filed at 11/20/12 0654  Gross per 24 hour  Intake 4141.25 ml  Output   2980 ml  Net 1161.25 ml    Intake/Output this shift: UOP 800 since MN +1161  Labs:  Recent Labs  11/20/12 0535  HGB 10.7*    Recent Labs  11/20/12 0535  WBC 10.1  RBC 3.83*  HCT 31.6*  PLT 226    Recent Labs  11/20/12 0535  NA 140  K 3.0*  CL 99  CO2 31  BUN 8  CREATININE 0.78  GLUCOSE 258*  CALCIUM 8.7   No results found for this basename: LABPT, INR,  in the last 72 hours  EXAM General - Patient is Alert, Appropriate and Oriented Extremity - Neurovascular intact Sensation intact distally Dorsiflexion/Plantar flexion intact Dressing - no drainage Motor Function - intact, moving foot and toes well on exam.  Hemovac pulled without difficulty.  Past Medical History  Diagnosis Date  . Diabetes mellitus   . Hypertension   . Hypothyroidism   . COPD (chronic obstructive pulmonary disease)   . Heart murmur   . GERD (gastroesophageal reflux disease)   . Arthritis   . 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  OFFICE NOTE DR. A. LITTLE STATES  "EXTREMELY PACEMAKER SENSITIVE AND WHEN YOU TRY TO CHECK FOR UNDERLYING RHYTHMS SHE WILL HAVE SNYCOPE AND WE HAVE NOT DONE THIS NOW IN ABOUT 2 YEARS".    Assessment/Plan: 1 Day Post-Op Procedure(s) (LRB): RIGHT TOTAL KNEE ARTHROPLASTY (Right) Principal Problem:   OA (osteoarthritis) of knee Active Problems:   Postop Hypokalemia  Estimated body mass index is 24.5 kg/(m^2) as calculated from the following:   Height as of this encounter: 5\' 2"  (1.575 m).   Weight as of this encounter: 60.782 kg (134 lb). Advance diet Up with therapy Discharge home with home health when ready.  No problems thru the night.  DC Tele bed and transfer up to Ortho floor.  DVT Prophylaxis - Xarelto, aspirin on hld Weight-Bearing as tolerated to right leg No vaccines. D/C O2 and Pulse OX and try on Room Air  PERKINS, ALEXZANDREW 11/20/2012, 10:09 AM

## 2012-11-20 NOTE — Progress Notes (Signed)
Pt refusing ice pack, states it is not helping. Pt did dangle on side of bed for approx 25 minutes and tolerated activity well. She is looking forward to working with PT.  Earnest Conroy. Clelia Croft, RN

## 2012-11-20 NOTE — Evaluation (Signed)
Occupational Therapy Evaluation Patient Details Name: Mercedes Barr MRN: [id number] DOB: 1946-03-11 Today's Date: 11/20/2012 Time: 4098-1191 OT Time Calculation (min): 21 min  OT Assessment / Plan / Recommendation Clinical Impression  Pt is a 67 year old s/p R TKR and displays increased pain, decreased activity tolerance due to dizziness and will benefit from skilled OT services to improve ADL independence.     OT Assessment  Patient needs continued OT Services    Follow Up Recommendations  Home health OT;Supervision/Assistance - 24 hour    Barriers to Discharge      Equipment Recommendations  None recommended by OT    Recommendations for Other Services    Frequency  Min 2X/week    Precautions / Restrictions Precautions Precautions: Knee Required Braces or Orthoses: Knee Immobilizer - Right Knee Immobilizer - Right: Discontinue once straight leg raise with < 10 degree lag Restrictions Weight Bearing Restrictions: No RLE Weight Bearing: Weight bearing as tolerated        ADL  Eating/Feeding: Simulated;Independent Where Assessed - Eating/Feeding: Chair Grooming: Simulated;Set up Where Assessed - Grooming: Supported sitting Upper Body Bathing: Simulated;Chest;Right arm;Left arm;Abdomen;Set up;Supervision/safety Where Assessed - Upper Body Bathing: Unsupported sitting Lower Body Bathing: Simulated;Moderate assistance Where Assessed - Lower Body Bathing: Supported sit to stand Upper Body Dressing: Simulated;Set up Where Assessed - Upper Body Dressing: Unsupported sitting Lower Body Dressing: Maximal assistance;Simulated (due to pain) Where Assessed - Lower Body Dressing: Supported sit to stand Toilet Transfer: Simulated;+2 Total assistance Toilet Transfer: Patient Percentage: 70% (took some steps away from the bed and chair pulled up) Toileting - Clothing Manipulation and Hygiene: Simulated;Moderate assistance Where Assessed - Toileting Clothing Manipulation and Hygiene:  Standing Equipment Used: Rolling walker ADL Comments: Pt limited by dizziness this visit and pain.     OT Diagnosis: Generalized weakness;Acute pain  OT Problem List: Decreased strength;Decreased activity tolerance OT Treatment Interventions: Self-care/ADL training;DME and/or AE instruction;Patient/family education;Therapeutic activities   OT Goals Acute Rehab OT Goals OT Goal Formulation: With patient Time For Goal Achievement: 11/20/12 Potential to Achieve Goals: Good ADL Goals Pt Will Perform Grooming: with supervision;Standing at sink ADL Goal: Grooming - Progress: Goal set today Pt Will Perform Lower Body Bathing: with min assist;Sit to stand from chair;Sit to stand from bed ADL Goal: Lower Body Bathing - Progress: Goal set today Pt Will Perform Lower Body Dressing: with min assist;Sit to stand from bed;Sit to stand from chair ADL Goal: Lower Body Dressing - Progress: Goal set today Pt Will Transfer to Toilet: with supervision;Ambulation;3-in-1;with DME ADL Goal: Toilet Transfer - Progress: Goal set today Pt Will Perform Toileting - Clothing Manipulation: with supervision;Standing ADL Goal: Toileting - Clothing Manipulation - Progress: Goal set today  Visit Information  Last OT Received On: 11/20/12 Assistance Needed: +2 PT/OT Co-Evaluation/Treatment: Yes    Subjective Data  Subjective: I am so sleepy. I didnt sleep last night Patient Stated Goal: pt agreeable to work with PT/OT   Prior Functioning     Home Living Lives With: Family Available Help at Discharge: Family Type of Home: House Home Access: Ramped entrance Home Layout: One level Bathroom Shower/Tub: Engineer, manufacturing systems: Standard Home Adaptive Equipment: Shower chair with back;Walker - rolling;Bedside commode/3-in-1 Prior Function Level of Independence: Independent Driving: Yes Communication Communication: No difficulties Dominant Hand: Right         Vision/Perception      Cognition  Cognition Overall Cognitive Status: Appears within functional limits for tasks assessed/performed Arousal/Alertness: Awake/alert Orientation Level: Appears intact for tasks assessed  Behavior During Session: [facility] for tasks performed    Extremity/Trunk Assessment Right Upper Extremity Assessment RUE ROM/Strength/Tone: [facility] for tasks assessed Left Upper Extremity Assessment LUE ROM/Strength/Tone: [facility] for tasks assessed Right Lower Extremity Assessment RLE ROM/Strength/Tone: Deficits RLE ROM/Strength/Tone Deficits: ankle motions WFL, unable to perform SLR without assist.  Knee flex 43 deg, has approx 10-15 deg ext lag.  RLE Sensation: WFL - Light Touch Left Lower Extremity Assessment LLE ROM/Strength/Tone: WFL for tasks assessed LLE Sensation: WFL - Light Touch Trunk Assessment Trunk Assessment: Normal     Mobility Bed Mobility Bed Mobility: Supine to Sit Supine to Sit: 4: Min assist;HOB elevated;With rails Details for Bed Mobility Assistance: Assist for RLE out of bed with mod cues for hand placement to self assist instead of using rails.  Transfers Sit to Stand: 4: Min assist;From elevated surface;With upper extremity assist;From bed Stand to Sit: 4: Min assist;With upper extremity assist;With armrests;To chair/3-in-1 Details for Transfer Assistance: Assist to rise, steady and control descent with cues for hand placement and LE management.       Exercise     Balance     End of Session OT - End of Session Equipment Utilized During Treatment: Gait belt Activity Tolerance: Patient limited by pain;Other (comment) (dizziness) Patient left: in chair;with call bell/phone within reach  GO     Lennox Laity [phone] 11/20/2012, 10:16 AM

## 2012-11-20 NOTE — Telephone Encounter (Signed)
Rx sent in for itching cream.  I will be back in clinic Thursday and can get one of the attendings to write the Rx for the walker then but cannot write it myself.  If anyone has time prior to Thursday you can ask a preceptor to write the rx. She needs it because she had a knee replacement yesterday.  Thanks.   Eugina Row 11/20/2012 8:06 AM

## 2012-11-20 NOTE — Progress Notes (Signed)
Inpatient Diabetes Program Recommendations  AACE/ADA: New Consensus Statement on Inpatient Glycemic Control (2013)  Target Ranges:  Prepandial:   less than 140 mg/dL      Peak postprandial:   less than 180 mg/dL (1-2 hours)      Critically ill patients:  140 - 180 mg/dL   Reason for Visit: Results for MERIDIAN, SCHERGER (MRN [id number]) as of 11/20/2012 14:54  Ref. Range 11/19/2012 11:06 11/19/2012 16:41 11/19/2012 20:41 11/20/2012 07:05 11/20/2012 12:12  Glucose-Capillary Latest Range: 70-99 mg/dL 413 (H) 244 (H) 010 (H) 266 (H) 228 (H)   Note CBG's greater than goal.  Note patient received PO Decadron this AM which likely has contributed to increased CBG's.  If CBG's remain greater than 180 mg/dL, please add Levemir 10 units daily.   Will need to follow-up with PCP regarding diabetes.  Will follow.

## 2012-11-20 NOTE — Telephone Encounter (Signed)
Patient had surgery yesterday with orthopedist for knee.  Please ask patient to contact orthopedist office for request for DME related to surgery.  Thanks!

## 2012-11-20 NOTE — Progress Notes (Signed)
Physical Therapy Treatment Patient Details Name: Mercedes Barr MRN: [id number] DOB: Apr 04, 1946 Today's Date: 11/20/2012 Time: 5621-3086 PT Time Calculation (min): 37 min  PT Assessment / Plan / Recommendation Comments on Treatment Session  Pt progressing well this afternoon with less pain and she had gotten some rest.  Pt to transfer to ortho floor this afternoon.     Follow Up Recommendations  Home health PT;Supervision/Assistance - 24 hour     Does the patient have the potential to tolerate intense rehabilitation     Barriers to Discharge        Equipment Recommendations  None recommended by PT    Recommendations for Other Services    Frequency 7X/week   Plan Discharge plan remains appropriate    Precautions / Restrictions Precautions Precautions: Knee Required Braces or Orthoses: Knee Immobilizer - Right Knee Immobilizer - Right: Discontinue once straight leg raise with < 10 degree lag Restrictions Weight Bearing Restrictions: No RLE Weight Bearing: Weight bearing as tolerated   Pertinent Vitals/Pain 6/10, pt premedicated.     Mobility  Bed Mobility Bed Mobility: Supine to Sit;Sit to Supine Supine to Sit: 4: Min assist Sit to Supine: 4: Min assist;HOB flat Details for Bed Mobility Assistance: Assist for RLE into and out of bed, but overall pt doing much better with hand placement getting to EOB.  Transfers Transfers: Sit to Stand;Stand to Sit Sit to Stand: 4: Min assist;From elevated surface;With upper extremity assist;With armrests;From chair/3-in-1;From bed Stand to Sit: 4: Min assist;With upper extremity assist;With armrests;To bed;To chair/3-in-1 Details for Transfer Assistance: Performed x 2 in order to use 3in1 in restroom with cues for hand placement and LE management.  Ambulation/Gait Ambulation/Gait Assistance: 4: Min assist Ambulation Distance (Feet): 60 Feet (and another 18') Assistive device: Rolling walker Ambulation/Gait Assistance Details: Cues for  sequencing/technique with RW and to maintain upright and relaxed posture throughout.  Gait Pattern: Step-to pattern;Decreased stride length;Antalgic;Trunk flexed Gait velocity: decreased    Exercises Total Joint Exercises Ankle Circles/Pumps: AROM;Both;20 reps Quad Sets: AROM;Right;10 reps Heel Slides: AAROM;Right;10 reps Hip ABduction/ADduction: AAROM;Right;10 reps Goniometric ROM: approx 50 this afternoon.    PT Diagnosis:    PT Problem List:   PT Treatment Interventions:     PT Goals Acute Rehab PT Goals PT Goal Formulation: With patient Time For Goal Achievement: 11/24/12 Potential to Achieve Goals: Good Pt will go Supine/Side to Sit: with supervision PT Goal: Supine/Side to Sit - Progress: Progressing toward goal Pt will go Sit to Supine/Side: with supervision PT Goal: Sit to Supine/Side - Progress: Progressing toward goal Pt will go Sit to Stand: with supervision PT Goal: Sit to Stand - Progress: Progressing toward goal Pt will go Stand to Sit: with supervision PT Goal: Stand to Sit - Progress: Progressing toward goal Pt will Ambulate: 51 - 150 feet;with supervision;with least restrictive assistive device PT Goal: Ambulate - Progress: Progressing toward goal Pt will Perform Home Exercise Program: with supervision, verbal cues required/provided PT Goal: Perform Home Exercise Program - Progress: Progressing toward goal  Visit Information  Last PT Received On: 11/20/12 Assistance Needed: +1    Subjective Data  Subjective: I know you want me to walk in that bathroom.  Patient Stated Goal: to return home.    Cognition  Cognition Overall Cognitive Status: Appears within functional limits for tasks assessed/performed Arousal/Alertness: Awake/alert Orientation Level: Appears intact for tasks assessed Behavior During Session: [facility] for tasks performed    Balance     End of Session PT - End of  Session Equipment Utilized During Treatment: Right knee immobilizer Activity  Tolerance: Patient tolerated treatment well Patient left: in bed;with call bell/phone within reach Nurse Communication: Mobility status CPM Right Knee CPM Right Knee: On   GP     Vista Deck 11/20/2012, 5:09 PM

## 2012-11-20 NOTE — Telephone Encounter (Signed)
To Delbert Harness, MD Fle[place], Maryjo [place]

## 2012-11-20 NOTE — Evaluation (Signed)
Physical Therapy Evaluation Patient Details Name: Mercedes Barr MRN: [id number] DOB: [birth year] Today's Date: 11/20/2012 Time: 6578-4696 PT Time Calculation (min): 21 min  PT Assessment / Plan / Recommendation Clinical Impression  Pt presents s/p R TKA POD 1 with decreased strength, ROM and mobility.  Tolerated OOB and able to ambulate approx 10' before getting dizzy and needing to return to recliner.  Pt will benefit from skilled PT in acute venue to address deficits.  PT recommends HHPT for follow up at D/C to maximize pts safety and function.     PT Assessment  Patient needs continued PT services    Follow Up Recommendations  Home health PT;Supervision/Assistance - 24 hour    Does the patient have the potential to tolerate intense rehabilitation      Barriers to Discharge None      Equipment Recommendations  None recommended by PT    Recommendations for Other Services     Frequency 7X/week    Precautions / Restrictions Precautions Precautions: Knee Required Braces or Orthoses: Knee Immobilizer - Right Knee Immobilizer - Right: Discontinue once straight leg raise with < 10 degree lag Restrictions Weight Bearing Restrictions: No RLE Weight Bearing: Weight bearing as tolerated   Pertinent Vitals/Pain 10/10 pain during mobility.  RN aware, ice packs applied      Mobility  Bed Mobility Bed Mobility: Supine to Sit Supine to Sit: 4: Min assist;HOB elevated;With rails Details for Bed Mobility Assistance: Assist for RLE out of bed with mod cues for hand placement to self assist instead of using rails.  Transfers Transfers: Sit to Stand;Stand to Sit Sit to Stand: 4: Min assist;From elevated surface;With upper extremity assist;From bed Stand to Sit: 4: Min assist;With upper extremity assist;With armrests;To chair/3-in-1 Details for Transfer Assistance: Assist to rise, steady and control descent with cues for hand placement and LE management.    Ambulation/Gait Ambulation/Gait Assistance: 1: +2 Total assist Ambulation/Gait: Patient Percentage: 70% Ambulation Distance (Feet): 10 Feet Assistive device: Rolling walker Ambulation/Gait Assistance Details: Max cues for upright posture, eyes open, and sequencing/technique with RW.  Unable to ambulate any further due to some dizziness, fatigue and increased pain.  Gait Pattern: Step-to pattern;Decreased stride length;Antalgic;Trunk flexed Gait velocity: decreased Stairs: No Wheelchair Mobility Wheelchair Mobility: No    Exercises     PT Diagnosis: Difficulty walking;Abnormality of gait;Acute pain  PT Problem List: Decreased strength;Decreased range of motion;Decreased activity tolerance;Decreased balance;Decreased mobility;Decreased coordination;Decreased knowledge of use of DME;Decreased safety awareness;Decreased knowledge of precautions;Pain PT Treatment Interventions: DME instruction;Gait training;Functional mobility training;Therapeutic activities;Therapeutic exercise;Balance training;Patient/family education   PT Goals Acute Rehab PT Goals PT Goal Formulation: With patient Time For Goal Achievement: 11/24/12 Potential to Achieve Goals: Good Pt will go Supine/Side to Sit: with supervision PT Goal: Supine/Side to Sit - Progress: Goal set today Pt will go Sit to Supine/Side: with supervision PT Goal: Sit to Supine/Side - Progress: Goal set today Pt will go Sit to Stand: with supervision PT Goal: Sit to Stand - Progress: Goal set today Pt will go Stand to Sit: with supervision PT Goal: Stand to Sit - Progress: Goal set today Pt will Ambulate: 51 - 150 feet;with supervision;with least restrictive assistive device PT Goal: Ambulate - Progress: Goal set today Pt will Perform Home Exercise Program: with supervision, verbal cues required/provided PT Goal: Perform Home Exercise Program - Progress: Goal set today  Visit Information  Last PT Received On: 11/20/12 Assistance Needed:  +2 (safety)    Subjective Data  Subjective: I'm just so tired,  I didn't get any sleep last night. Patient Stated Goal: to return home.    Prior Functioning  Home Living Lives With: Family Available Help at Discharge: Family Type of Home: House Home Access: Ramped entrance Home Layout: One level Bathroom Shower/Tub: Engineer, manufacturing systems: Standard Home Adaptive Equipment: Shower chair with back;Walker - rolling;Bedside commode/3-in-1 Prior Function Level of Independence: Independent Driving: Yes Communication Communication: No difficulties Dominant Hand: Right    Cognition  Cognition Overall Cognitive Status: Appears within functional limits for tasks assessed/performed Arousal/Alertness: Lethargic Orientation Level: Appears intact for tasks assessed Behavior During Session: Lethargic    Extremity/Trunk Assessment Right Lower Extremity Assessment RLE ROM/Strength/Tone: Deficits RLE ROM/Strength/Tone Deficits: ankle motions WFL, unable to perform SLR without assist.  Knee flex 43 deg, has approx 10-15 deg ext lag.  RLE Sensation: WFL - Light Touch Left Lower Extremity Assessment LLE ROM/Strength/Tone: WFL for tasks assessed LLE Sensation: WFL - Light Touch Trunk Assessment Trunk Assessment: Normal   Balance    End of Session PT - End of Session Equipment Utilized During Treatment: Gait belt;Right knee immobilizer Activity Tolerance: Patient limited by pain;Patient limited by fatigue Patient left: in chair;with call bell/phone within reach;with family/visitor present Nurse Communication: Mobility status  GP     Vista Deck 11/20/2012, 9:52 AM

## 2012-11-20 NOTE — Telephone Encounter (Signed)
Unable to contact pt.  Spoke with Mr. Valentino Saxon (pts boyfriend) but he does not know what room she is in to call and speak with her.  Will await callback from patient.  Please inform of the below:  I contacted Dr. Deri Fuelling office, they informed me that should be something that they do at discharge. Isaly Fasching, Maryjo [place]

## 2012-11-21 LAB — BASIC METABOLIC PANEL
Calcium: 9 mg/dL (ref 8.4–10.5)
GFR calc Af Amer: >90 mL/min (ref >90)
GFR calc non Af Amer: >90 mL/min (ref >90)
Potassium: 3.7 mEq/L (ref 3.5–5.1)
Sodium: 136 mEq/L (ref 135–145)

## 2012-11-21 LAB — CBC
MCHC: 33.5 g/dL (ref 30.0–36.0)
Platelets: 239 10*3/uL (ref 150–400)
RDW: 13 % (ref 11.5–15.5)

## 2012-11-21 LAB — GLUCOSE, CAPILLARY
Glucose-Capillary: 152 mg/dL — ABNORMAL HIGH (ref 70–99)
Glucose-Capillary: 170 mg/dL — ABNORMAL HIGH (ref 70–99)

## 2012-11-21 MED ORDER — ADULT MULTIVITAMIN W/MINERALS CH
1.0000 | ORAL_TABLET | Freq: Every day | ORAL | Status: DC
  Administered 2012-11-21 – 2012-11-23 (×3): 1 via ORAL
  Filled 2012-11-21 (×3): qty 1

## 2012-11-21 MED ORDER — SODIUM CHLORIDE 0.9 % IV BOLUS (SEPSIS)
500.0000 mL | Freq: Once | INTRAVENOUS | Status: AC
  Administered 2012-11-21: 500 mL via INTRAVENOUS

## 2012-11-21 MED ORDER — GLUCERNA SHAKE PO LIQD
237.0000 mL | Freq: Two times a day (BID) | ORAL | Status: DC
  Administered 2012-11-21 – 2012-11-23 (×4): 237 mL via ORAL
  Filled 2012-11-21 (×5): qty 237

## 2012-11-21 MED ORDER — HYDROCODONE-ACETAMINOPHEN 5-325 MG PO TABS
1.0000 | ORAL_TABLET | ORAL | Status: DC | PRN
  Administered 2012-11-21: 2 via ORAL
  Filled 2012-11-21 (×2): qty 2

## 2012-11-21 NOTE — Progress Notes (Signed)
Patient low BP 93/56 HR-78 and C/O dizziness, notified A. Perkins-PA and he stated to only hold the hydrochlorothiazide and give 500cc bolus of NS. Will continue to assess patient.

## 2012-11-21 NOTE — Progress Notes (Signed)
   Subjective: 2 Days Post-Op Procedure(s) (LRB): RIGHT TOTAL KNEE ARTHROPLASTY (Right) Patient reports pain as mild and moderate last night.  Little better today but has had nausea and vomiting. Will see how she does today. Possibly home tomorrow if improved. Patient seen in rounds with Dr. Lequita Halt. Plan is to go Home after hospital stay.  Objective: Vital signs in last 24 hours: Temp:  [98.1 F (36.7 C)-98.4 F (36.9 C)] 98.1 F (36.7 C) (04/16 0505) Pulse Rate:  [69-78] 69 (04/16 1148) Resp:  [14-18] 16 (04/16 1148) BP: (93-126)/(46-63) 117/46 mmHg (04/16 1148) SpO2:  [95 %-100 %] 100 % (04/16 1148)  Intake/Output from previous day:  Intake/Output Summary (Last 24 hours) at 11/21/12 1434 Last data filed at 11/21/12 1043  Gross per 24 hour  Intake 1751.25 ml  Output   1100 ml  Net 651.25 ml    Intake/Output this shift: Total I/O In: 740 [P.O.:240; IV Piggyback:500] Out: -   Labs:  Recent Labs  11/20/12 0535 11/21/12 0446  HGB 10.7* 9.5*    Recent Labs  11/20/12 0535 11/21/12 0446  WBC 10.1 17.0*  RBC 3.83* 3.45*  HCT 31.6* 28.4*  PLT 226 239    Recent Labs  11/20/12 0535 11/21/12 0446  NA 140 136  K 3.0* 3.7  CL 99 97  CO2 31 31  BUN 8 9  CREATININE 0.78 0.60  GLUCOSE 258* 189*  CALCIUM 8.7 9.0   No results found for this basename: LABPT, INR,  in the last 72 hours  EXAM General - Patient is Alert, Appropriate and Oriented Extremity - Neurovascular intact Sensation intact distally Dorsiflexion/Plantar flexion intact No cellulitis present Dressing/Incision - clean, dry, no drainage, healing Motor Function - intact, moving foot and toes well on exam.   Past Medical History  Diagnosis Date  . Diabetes mellitus   . Hypertension   . Hypothyroidism   . COPD (chronic obstructive pulmonary disease)   . Heart murmur   . GERD (gastroesophageal reflux disease)   . Arthritis   . 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  OFFICE NOTE DR. A. LITTLE STATES  "EXTREMELY PACEMAKER SENSITIVE AND WHEN YOU TRY TO CHECK FOR UNDERLYING RHYTHMS SHE WILL HAVE SNYCOPE AND WE HAVE NOT DONE THIS NOW IN ABOUT 2 YEARS".    Assessment/Plan: 2 Days Post-Op Procedure(s) (LRB): RIGHT TOTAL KNEE ARTHROPLASTY (Right) Principal Problem:   OA (osteoarthritis) of knee Active Problems:   Postop Hypokalemia  Estimated body mass index is 24.5 kg/(m^2) as calculated from the following:   Height as of this encounter: 5\' 2"  (1.575 m).   Weight as of this encounter: 60.782 kg (134 lb). Up with therapy Plan for discharge tomorrow Discharge home with home health  DVT Prophylaxis - Xarelto, Aspirin on hold Weight-Bearing as tolerated to right leg  Mercedes Barr 11/21/2012, 2:34 PM

## 2012-11-21 NOTE — Progress Notes (Signed)
Physical Therapy Treatment Patient Details Name: Mercedes Barr MRN: [id number] DOB: [birth year] Today's Date: 11/21/2012 Time: 1308-6578 PT Time Calculation (min): 26 min  PT Assessment / Plan / Recommendation Comments on Treatment Session  Pt with dizziness upon gait limiting distance and wished to try again however told pt that PT would return this afternoon.  Pt also performed exercises in recliner.  Pt will need at least min assist upon d/c.    Follow Up Recommendations  Home health PT;Supervision/Assistance - 24 hour     Does the patient have the potential to tolerate intense rehabilitation     Barriers to Discharge        Equipment Recommendations  None recommended by PT    Recommendations for Other Services    Frequency     Plan Discharge plan remains appropriate;Frequency remains appropriate    Precautions / Restrictions Precautions Precautions: Knee Required Braces or Orthoses: Knee Immobilizer - Right Knee Immobilizer - Right: Discontinue once straight leg raise with < 10 degree lag Restrictions Weight Bearing Restrictions: No RLE Weight Bearing: Weight bearing as tolerated   Pertinent Vitals/Pain Pt reports 7/10 pain (states down from 9/10 this morning and then she received pain meds), positioned to comfort and ice packs applied to R knee.    Mobility  Bed Mobility Bed Mobility: Supine to Sit;Sit to Supine Supine to Sit: 4: Min assist Sit to Supine: 4: Min assist Details for Bed Mobility Assistance: Assist for RLE into and out of bed, pt sitting EOB on arrival so returned to supine to don KI Transfers Transfers: Sit to Stand;Stand to Sit Sit to Stand: 4: Min assist;From elevated surface;With upper extremity assist;With armrests;From bed Stand to Sit: 4: Min assist;With upper extremity assist;With armrests;To chair/3-in-1 Details for Transfer Assistance: verbal cues for safe technique including hand placement and R LE forward, assist to rise and control  descent Ambulation/Gait Ambulation/Gait Assistance: 4: Min assist Ambulation Distance (Feet): 18 Feet Assistive device: Rolling walker Ambulation/Gait Assistance Details: verbal cues for sequence, RW distance, posture, step length, pt reported slight dizziness upon standing however wished to continue, had pt sit in recliner when she reported dizziness was worse, BP: 114/44 (BP low this morning per RN and RN aware of BP after gait) Gait Pattern: Step-to pattern;Antalgic;Trunk flexed;Decreased stance time - left;Decreased step length - left Gait velocity: decreased    Exercises Total Joint Exercises Ankle Circles/Pumps: AROM;Both;20 reps Quad Sets: AROM;Right;10 reps Short Arc QuadBarbaraann Boys;Right;10 reps Heel Slides: AAROM;Right;10 reps Hip ABduction/ADduction: AROM;Right;10 reps Goniometric ROM: R knee active ROM -20-55* in recliner limited by pain   PT Diagnosis:    PT Problem List:   PT Treatment Interventions:     PT Goals Acute Rehab PT Goals PT Goal: Supine/Side to Sit - Progress: Progressing toward goal PT Goal: Sit to Supine/Side - Progress: Progressing toward goal PT Goal: Sit to Stand - Progress: Progressing toward goal PT Goal: Stand to Sit - Progress: Progressing toward goal PT Goal: Ambulate - Progress: Progressing toward goal PT Goal: Perform Home Exercise Program - Progress: Progressing toward goal  Visit Information  Last PT Received On: 11/21/12 Assistance Needed: +2    Subjective Data  Subjective: Can we walk again now?  (after dizzy episode requiring recliner during ambulation) Patient Stated Goal: to return home.    Cognition       Balance     End of Session PT - End of Session Equipment Utilized During Treatment: Right knee immobilizer;Gait belt Activity Tolerance: Patient limited by pain  Patient left: with call bell/phone within reach;in chair;with chair alarm set   GP     Amias Hutchinson,KATHrine E 11/21/2012, 12:13 PM Zenovia Jarred, PT,  DPT 11/21/2012 Pager: [phone]

## 2012-11-21 NOTE — Progress Notes (Signed)
PT Cancellation Note  Patient Details Name: Mercedes Barr MRN: [id number] DOB: [birth year]   Cancelled Treatment:    Reason Eval/Treat Not Completed: Other (comment) Pt reports too weak, BP still low, and unable to eat lunch, so did not wish to participate in 2nd session today.     Emine Lopata,KATHrine E 11/21/2012, 1:57 PM Pager: [phone]

## 2012-11-21 NOTE — Progress Notes (Signed)
Patient transfered to 1602, alert and oriented, denies any distress. Surgical wound clean dry/intact changed by A. Perkins-PA today, no other wound noted.

## 2012-11-21 NOTE — Progress Notes (Signed)
INITIAL NUTRITION ASSESSMENT  DOCUMENTATION CODES Per approved criteria  -Non-severe (moderate) malnutrition in the context of acute illness or injury  Pt meets criteria for Non-severe (moderate) MALNUTRITION in the context of acute illness as evidenced by <75% of estimated needs for 2 weeks and 6% wt loss in less than 1 month.   INTERVENTION: Provide daily multivitamin with minerals Provide Glucerna BID in between meals  NUTRITION DIAGNOSIS: Inadequate oral intake related to stress and poor appetite as evidenced by pt eating one meal per day for 2 weeks and 6% wt loss in less than 1 month.   Goal: Pt to meet >/= 90% of their estimated nutrition needs  Monitor:  Po intake Wt Labs  Reason for Assessment: MST  67 y.o. female  Admitting Dx: OA (osteoarthritis) of knee  ASSESSMENT: 67 y.o. year old female with end stage OA of her right knee with progressively worsening pain and dysfunction. She presents now for right Total Knee Arthroplasty on  4/14.  Pt states that she has had a poor appetite for the past 2-3 weeks due to stress and has only been eating 1 meal per day. Since surgery pt has only had clears because when she attempted to eat a meal she became nauseous. Pt states she plans on ordering lunch and trying to eat better today. Pt reports following a low sodium, low sugar diet at home. Encouraged pt to get adequate po intake, adequate protein, and to maintain normal blood glucose levels to improve healing and recovery.   Height: Ht Readings from Last 1 Encounters:  11/19/12 5\' 2"  (1.575 m)    Weight: Wt Readings from Last 1 Encounters:  11/19/12 134 lb (60.782 kg)    Ideal Body Weight: 110 lbs  % Ideal Body Weight: 122%  Wt Readings from Last 10 Encounters:  11/19/12 134 lb (60.782 kg)  11/19/12 134 lb (60.782 kg)  11/12/12 137 lb 9.6 oz (62.415 kg)  10/25/12 143 lb (64.864 kg)  10/03/12 143 lb (64.864 kg)  08/22/12 141 lb (63.957 kg)  06/08/12 145 lb  (65.772 kg)  05/10/12 142 lb (64.411 kg)  03/08/12 148 lb (67.132 kg)  01/30/12 144 lb (65.318 kg)    Usual Body Weight: 143 lbs  % Usual Body Weight: 94%  BMI:  Body mass index is 24.5 kg/(m^2).  Estimated Nutritional Needs: Kcal: 1520-1700 Protein: 79-91 grams Fluid: 2.1 L  Skin: incision on right knee  Diet Order: Carb Control  EDUCATION NEEDS: -No education needs identified at this time   Intake/Output Summary (Last 24 hours) at 11/21/12 1102 Last data filed at 11/21/12 0831  Gross per 24 hour  Intake 1371.25 ml  Output   1550 ml  Net -178.75 ml    Last BM: PTA  Labs:   Recent Labs Lab 11/20/12 0535 11/21/12 0446  NA 140 136  K 3.0* 3.7  CL 99 97  CO2 31 31  BUN 8 9  CREATININE 0.78 0.60  CALCIUM 8.7 9.0  GLUCOSE 258* 189*    CBG (last 3)   Recent Labs  11/20/12 1646 11/20/12 2111 11/21/12 0720  GLUCAP 204* 204* 152*    Scheduled Meds: . citalopram  20 mg Oral q morning - 10a  . docusate sodium  100 mg Oral BID  . fluticasone  1 puff Inhalation BID  . furosemide  20 mg Oral q morning - 10a  . glyBURIDE  10 mg Oral BID WC  . hydrochlorothiazide  12.5 mg Oral q morning - 10a  .  insulin aspart  0-15 Units Subcutaneous TID WC  . levothyroxine  75 mcg Oral QAC breakfast  . linagliptin  5 mg Oral Daily  . losartan  50 mg Oral q morning - 10a  . metFORMIN  1,000 mg Oral BID WC  . omeprazole  20 mg Oral BID AC  . propranolol  40 mg Oral BID  . rivaroxaban  10 mg Oral Q breakfast  . Travoprost (BAK Free)  1 drop Both Eyes QHS    Continuous Infusions: . 0.9 % NaCl with KCl 20 mEq / L 20 mL/hr at 11/20/12 1015    Past Medical History  Diagnosis Date  . Diabetes mellitus   . Hypertension   . Hypothyroidism   . COPD (chronic obstructive pulmonary disease)   . Heart murmur   . GERD (gastroesophageal reflux disease)   . Arthritis   . Dysrhythmia 1988    COMPLETE HEART BLOCK FOLLOWING SURGERY 1988 TO REPAIR ATRIOVENTRICULAR CUSHION  DEFECT-HAS PERMANENT PACEMAKER  . 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  OFFICE NOTE DR. A. LITTLE STATES  "EXTREMELY PACEMAKER SENSITIVE AND WHEN YOU TRY TO CHECK FOR UNDERLYING RHYTHMS SHE WILL HAVE SNYCOPE AND WE HAVE NOT DONE THIS NOW IN ABOUT 2 YEARS".    Past Surgical History  Procedure Laterality Date  . Pacemaker insertion  1988  . Tubal ligation    . Cardiac catheterization  2011     SHOWED NO CAD-PER CARDIOLOGY OFFICE NOTES DR. A. LITTLE  . Atrioventricular cushion defect repair  1988  . Total knee arthroplasty Right 11/19/2012    Procedure: RIGHT TOTAL KNEE ARTHROPLASTY;  Surgeon: Loanne Drilling, MD;  Location: [facility];  Service: Orthopedics;  Laterality: Right;    Ian Malkin RD, LDN Inpatient Clinical Dietitian Pager: [phone] After Hours Pager: [phone]

## 2012-11-21 NOTE — Progress Notes (Signed)
OT Cancellation Note  Patient Details Name: Mercedes Barr MRN: [id number] DOB: July 15, 1946   Cancelled Treatment:    Reason Eval/Treat Not Completed: Medical issues which prohibited therapy--Attempted to see pt earlier but she had just gotten pain meds. Tried back later and she was working with PT and per PT was dizzy and with decreased BP during session. PA in room and requested OT try back later this afternoon. Will reattempt as schedule allows or in am.   Lennox Laity [phone] 11/21/2012, 11:51 AM

## 2012-11-22 DIAGNOSIS — D62 Acute posthemorrhagic anemia: Secondary | ICD-10-CM

## 2012-11-22 DIAGNOSIS — Z9289 Personal history of other medical treatment: Secondary | ICD-10-CM

## 2012-11-22 LAB — CBC
HCT: 25 % — ABNORMAL LOW (ref 36.0–46.0)
MCV: 83.3 fL (ref 78.0–100.0)
Platelets: 210 10*3/uL (ref 150–400)
RBC: 3 MIL/uL — ABNORMAL LOW (ref 3.87–5.11)
WBC: 11.1 10*3/uL — ABNORMAL HIGH (ref 4.0–10.5)

## 2012-11-22 LAB — GLUCOSE, CAPILLARY
Glucose-Capillary: 163 mg/dL — ABNORMAL HIGH (ref 70–99)
Glucose-Capillary: 174 mg/dL — ABNORMAL HIGH (ref 70–99)

## 2012-11-22 MED ORDER — BISACODYL 10 MG RE SUPP: 10.0000 mg | Freq: Once | RECTAL | Status: DC

## 2012-11-22 MED ORDER — SODIUM CHLORIDE 0.9 % IV SOLN: INTRAVENOUS | Status: DC

## 2012-11-22 MED ORDER — BISACODYL 10 MG RE SUPP: 10.0000 mg | Freq: Every day | RECTAL | Status: DC | PRN

## 2012-11-22 MED ORDER — POLYSACCHARIDE IRON COMPLEX 150 MG PO CAPS
150.0000 mg | ORAL_CAPSULE | Freq: Every day | ORAL | Status: DC
  Administered 2012-11-22 – 2012-11-23 (×2): 150 mg via ORAL
  Filled 2012-11-22 (×2): qty 1

## 2012-11-22 NOTE — Progress Notes (Signed)
Physical Therapy Treatment Patient Details Name: Mercedes Barr MRN: [id number] DOB: 07-30-1946 Today's Date: 11/22/2012 Time: 4098-1191 PT Time Calculation (min): 16 min  PT Assessment / Plan / Recommendation Comments on Treatment Session  Pt continues to feel lethargic-BP still somewhat low and Hgb low as well. Pt scheduled to receive transfusion today. will attempt to have 2nd session after that.     Follow Up Recommendations  Home health PT;Supervision/Assistance - 24 hour     Does the patient have the potential to tolerate intense rehabilitation     Barriers to Discharge        Equipment Recommendations  None recommended by PT    Recommendations for Other Services    Frequency 7X/week   Plan Discharge plan remains appropriate    Precautions / Restrictions Precautions Precautions: Knee Required Braces or Orthoses: Knee Immobilizer - Right Knee Immobilizer - Right: Discontinue once straight leg raise with < 10 degree lag Restrictions Weight Bearing Restrictions: No RLE Weight Bearing: Weight bearing as tolerated   Pertinent Vitals/Pain 8/10 R knee    Mobility  Bed Mobility Bed Mobility: Not assessed Details for Bed Mobility Assistance: pt sitting in recliner Transfers Transfers: Sit to Stand;Stand to Sit Sit to Stand: 4: Min guard;From chair/3-in-1 Stand to Sit: 4: Min guard;To chair/3-in-1 Details for Transfer Assistance: verbal cues for safe technique including hand placement and R LE forward, assist to rise and control descent Ambulation/Gait Ambulation/Gait Assistance: 4: Min assist Ambulation Distance (Feet): 20 Feet Assistive device: Rolling walker Ambulation/Gait Assistance Details: Pt requested to ambulate. Distance limited by fatigue, weakness-pt scheduled to receive blood later this am. Too weak to ambulate further. Recliner for return to room. Gait Pattern: Step-to pattern;Antalgic;Decreased stance time - right;Decreased step length - right;Decreased  stride length    Exercises Total Joint Exercises Ankle Circles/Pumps: AROM;Both;10 reps;Seated Quad Sets: AROM;Both;10 reps;Seated   PT Diagnosis:    PT Problem List:   PT Treatment Interventions:     PT Goals Acute Rehab PT Goals Pt will go Sit to Stand: with supervision PT Goal: Sit to Stand - Progress: Progressing toward goal Pt will go Stand to Sit: with supervision PT Goal: Stand to Sit - Progress: Progressing toward goal Pt will Ambulate: 51 - 150 feet;with supervision;with least restrictive assistive device PT Goal: Ambulate - Progress: Progressing toward goal Pt will Perform Home Exercise Program: with supervision, verbal cues required/provided PT Goal: Perform Home Exercise Program - Progress: Progressing toward goal  Visit Information  Last PT Received On: 11/22/12 Assistance Needed: +1    Subjective Data  Subjective: I hope  I feel better Patient Stated Goal: to return home.    Cognition  Cognition Arousal/Alertness: Lethargic Behavior During Therapy: WFL for tasks assessed/performed Overall Cognitive Status: Within Functional Limits for tasks assessed    Balance     End of Session PT - End of Session Equipment Utilized During Treatment: Right knee immobilizer Activity Tolerance: Patient limited by fatigue;Patient limited by pain Patient left: in chair;with call bell/phone within reach   GP     Rebeca Alert, PT Pager: [phone]

## 2012-11-22 NOTE — Progress Notes (Signed)
Physical Therapy Treatment Patient Details Name: Mercedes Barr MRN: [id number] DOB: Mar 08, 1946 Today's Date: 11/22/2012 Time: 4098-1191 PT Time Calculation (min): 8 min  PT Assessment / Plan / Recommendation Comments on Treatment Session  Assisted pt from bathroom to bed. Pt states she feels a little better. Will attempt to see later today.     Follow Up Recommendations  Home health PT;Supervision/Assistance - 24 hour     Does the patient have the potential to tolerate intense rehabilitation     Barriers to Discharge        Equipment Recommendations  None recommended by PT    Recommendations for Other Services    Frequency 7X/week   Plan Discharge plan remains appropriate    Precautions / Restrictions Precautions Precautions: Knee;Fall Required Braces or Orthoses: Knee Immobilizer - Right Knee Immobilizer - Right: Discontinue once straight leg raise with < 10 degree lag Restrictions Weight Bearing Restrictions: No RLE Weight Bearing: Weight bearing as tolerated   Pertinent Vitals/Pain 7/10 R knee    Mobility  Bed Mobility Bed Mobility: Sit to Supine Sit to Supine: 4: Min assist Details for Bed Mobility Assistance: assist for R LE onto bed.  Transfers Transfers: Sit to Stand;Stand to Sit Sit to Stand: 4: Min guard;From toilet Stand to Sit: 4: Min guard;To bed Details for Transfer Assistance: verbal cues for safe technique including hand placement and R LE forward Ambulation/Gait Ambulation/Gait Assistance: 4: Min assist Ambulation Distance (Feet): 15 Feet Assistive device: Rolling walker Ambulation/Gait Assistance Details: Ambulation from bathroom to bed.  Gait Pattern: Step-to pattern;Antalgic;Decreased stance time - right;Decreased stride length    Exercises Total Joint Exercises Ankle Circles/Pumps: AROM;Both;10 reps;Seated Quad Sets: AROM;Both;10 reps;Seated   PT Diagnosis:    PT Problem List:   PT Treatment Interventions:     PT Goals Acute Rehab PT  Goals Pt will go Sit to Supine/Side: with supervision PT Goal: Sit to Supine/Side - Progress: Progressing toward goal Pt will go Sit to Stand: with supervision PT Goal: Sit to Stand - Progress: Progressing toward goal Pt will go Stand to Sit: with supervision PT Goal: Stand to Sit - Progress: Progressing toward goal Pt will Ambulate: 51 - 150 feet;with supervision;with least restrictive assistive device PT Goal: Ambulate - Progress: Progressing toward goal Pt will Perform Home Exercise Program: with supervision, verbal cues required/provided PT Goal: Perform Home Exercise Program - Progress: Progressing toward goal  Visit Information  Last PT Received On: 11/22/12 Assistance Needed: +1    Subjective Data  Subjective: I feel a little better Patient Stated Goal: to return home.    Cognition  Cognition Arousal/Alertness: Lethargic Behavior During Therapy: WFL for tasks assessed/performed Overall Cognitive Status: Within Functional Limits for tasks assessed    Balance     End of Session PT - End of Session Equipment Utilized During Treatment: Gait belt;Right knee immobilizer Activity Tolerance: Patient tolerated treatment well Patient left: in bed;with call bell/phone within reach   GP     Rebeca Alert, PT Pager: [phone]

## 2012-11-22 NOTE — Progress Notes (Signed)
Occupational Therapy Treatment Patient Details Name: Mercedes Barr MRN: [id number] DOB: [birth year] Today's Date: 11/22/2012 Time: 8469-6295 OT Time Calculation (min): 13 min  OT Assessment / Plan / Recommendation Comments on Treatment Session Pt did well despite dizziness and nausea.    Follow Up Recommendations  Home health OT;Supervision/Assistance - 24 hour    Barriers to Discharge       Equipment Recommendations  None recommended by OT    Recommendations for Other Services    Frequency Min 2X/week   Plan Discharge plan remains appropriate    Precautions / Restrictions Precautions Precautions: Knee Required Braces or Orthoses: Knee Immobilizer - Right Knee Immobilizer - Right: Discontinue once straight leg raise with < 10 degree lag Restrictions Weight Bearing Restrictions: No RLE Weight Bearing: Weight bearing as tolerated   Pertinent Vitals/Pain Pt with pain in knee.  Pt dizzy.  Getting ready to get blood.    ADL  Grooming: Performed;Set up Where Assessed - Grooming: Supported standing Lower Body Dressing: Performed;Minimal assistance Where Assessed - Lower Body Dressing: Supported sit to stand Toilet Transfer: Performed;Min Pension scheme manager Method: Sit to stand;Stand pivot Acupuncturist: Comfort height toilet;Grab bars Toileting - Architect and Hygiene: Simulated;Minimal assistance Where Assessed - Engineer, mining and Hygiene: Standing Tub/Shower Transfer:  (discussed.  Pt to dizzy to try. has tub bench at home.) Tub/Shower Transfer Equipment: Counsellor Used: Rolling walker Transfers/Ambulation Related to ADLs: Pt walked into hallway after standing at sink to do adls. pt to get blood anytime.  Very dizzy and nauseated. ADL Comments: Pt limited by dizziness this visit and pain.     OT Diagnosis:    OT Problem List:   OT Treatment Interventions:     OT Goals Acute Rehab OT Goals OT Goal  Formulation: With patient Time For Goal Achievement: 11/24/12 Potential to Achieve Goals: Good ADL Goals Pt Will Perform Grooming: with supervision;Standing at sink ADL Goal: Grooming - Progress: Met Pt Will Perform Lower Body Bathing: with min assist;Sit to stand from chair;Sit to stand from bed Pt Will Perform Lower Body Dressing: with min assist;Sit to stand from bed;Sit to stand from chair ADL Goal: Lower Body Dressing - Progress: Met Pt Will Transfer to Toilet: with supervision;Ambulation;3-in-1;with DME ADL Goal: Toilet Transfer - Progress: Progressing toward goals Pt Will Perform Toileting - Clothing Manipulation: with supervision;Standing ADL Goal: Toileting - Clothing Manipulation - Progress: Progressing toward goals  Visit Information  Last OT Received On: 11/22/12 Assistance Needed: +1 PT/OT Co-Evaluation/Treatment: Yes    Subjective Data      Prior Functioning       Cognition  Cognition Arousal/Alertness: Awake/alert Behavior During Therapy: WFL for tasks assessed/performed Overall Cognitive Status: Within Functional Limits for tasks assessed    Mobility  Transfers Transfers: Sit to Stand;Stand to Sit Sit to Stand: 4: Min guard;From chair/3-in-1;With armrests Stand to Sit: 4: Min guard;With armrests;To chair/3-in-1 Details for Transfer Assistance: verbal cues for safe technique including hand placement and R LE forward, assist to rise and control descent    Exercises      Balance     End of Session OT - End of Session Activity Tolerance: Patient limited by pain;Other (comment) Patient left: in chair;with call bell/phone within reach Nurse Communication: Mobility status  GO     Hope Budds 11/22/2012, 9:54 AM [phone]

## 2012-11-22 NOTE — Progress Notes (Signed)
Physical Therapy Treatment Patient Details Name: Mercedes Barr MRN: [id number] DOB: 22-Mar-1946 Today's Date: 11/22/2012 Time: 4098-1191 PT Time Calculation (min): 23 min  PT Assessment / Plan / Recommendation Comments on Treatment Session  Progressing with mobility. Possible d/c home tomorrow. Will need to practice steps. Recommend HHPT.     Follow Up Recommendations  Home health PT     Does the patient have the potential to tolerate intense rehabilitation     Barriers to Discharge        Equipment Recommendations  None recommended by PT    Recommendations for Other Services    Frequency 7X/week   Plan Discharge plan remains appropriate    Precautions / Restrictions Precautions Precautions: Knee;Fall Required Braces or Orthoses: Knee Immobilizer - Right Knee Immobilizer - Right: Discontinue once straight leg raise with < 10 degree lag Restrictions Weight Bearing Restrictions: No RLE Weight Bearing: Weight bearing as tolerated   Pertinent Vitals/Pain 7/10 R knee    Mobility  Bed Mobility Bed Mobility: Supine to Sit;Sit to Supine Supine to Sit: 4: Min assist Sit to Supine: 4: Min assist Details for Bed Mobility Assistance: Assist for R LE onto/off bed.  Transfers Transfers: Sit to Stand;Stand to Sit Sit to Stand: 4: Min assist;From bed Stand to Sit: 4: Min guard;To bed Details for Transfer Assistance: verbal cues for safe technique including hand placement and R LE forward. Assist to rise, stabilize Ambulation/Gait Ambulation/Gait Assistance: 4: Min guard Ambulation Distance (Feet): 80 Feet Assistive device: Rolling walker Ambulation/Gait Assistance Details: slow gait speed, but steady. no c/o dizziness or nausea Gait Pattern: Step-to pattern;Decreased stride length;Antalgic;Decreased stance time - right    Exercises Total Joint Exercises Ankle Circles/Pumps: AROM;Both;10 reps;Supine Quad Sets: AROM;Both;10 reps;Supine Short Arc Quad: AAROM;Right;10  reps;Supine Heel Slides: AAROM;Right;10 reps;Supine Hip ABduction/ADduction: AAROM;Right;10 reps;Supine Straight Leg Raises: AAROM;Right;10 reps;Supine   PT Diagnosis:    PT Problem List:   PT Treatment Interventions:     PT Goals Acute Rehab PT Goals Pt will go Supine/Side to Sit: with supervision PT Goal: Supine/Side to Sit - Progress: Progressing toward goal Pt will go Sit to Supine/Side: with supervision PT Goal: Sit to Supine/Side - Progress: Progressing toward goal Pt will go Sit to Stand: with supervision PT Goal: Sit to Stand - Progress: Progressing toward goal Pt will go Stand to Sit: with supervision PT Goal: Stand to Sit - Progress: Progressing toward goal Pt will Ambulate: 51 - 150 feet;with supervision;with least restrictive assistive device PT Goal: Ambulate - Progress: Progressing toward goal Pt will Perform Home Exercise Program: with supervision, verbal cues required/provided PT Goal: Perform Home Exercise Program - Progress: Progressing toward goal  Visit Information  Last PT Received On: 11/22/12 Assistance Needed: +1    Subjective Data  Subjective: this was better than this morning Patient Stated Goal: to return home.    Cognition  Cognition Arousal/Alertness: Awake/alert Behavior During Therapy: WFL for tasks assessed/performed Overall Cognitive Status: Within Functional Limits for tasks assessed    Balance     End of Session PT - End of Session Equipment Utilized During Treatment: Gait belt;Right knee immobilizer Activity Tolerance: Patient tolerated treatment well Patient left: in bed;with call bell/phone within reach CPM Right Knee CPM Right Knee: On   GP     Rebeca Alert, PT Pager: [phone]

## 2012-11-22 NOTE — Care Management Note (Signed)
    Page 1 of 2   11/23/2012     2:49:11 PM   CARE MANAGEMENT NOTE 11/23/2012  Patient:  Mercedes Barr, Mercedes Barr   Account Number:  [id number]  Date Initiated:  11/19/2012  Documentation initiated by:  Jiles Crocker  Subjective/Objective Assessment:   ADMITTED FOR SURGERY - Right  Total Knee Arthroplasty     Action/Plan:   PCP IS DR CHAMBERLAIN; LIVES WITH FAMILY MEMBERS; HHC IS ARRANGED WITH [facility] FOR HOME CARE SERVICES AT DISCHARGE   Anticipated DC Date:  11/22/2012   Anticipated DC Plan:  HOME W HOME HEALTH SERVICES      DC Planning Services  CM consult      [facility] Choice  HOME HEALTH   Choice offered to / List presented to:  C-1 Patient        HH arranged  HH-2 PT      [facility] agency  [facility]   Status of service:  Completed, signed off Medicare Important Message given?  NA - LOS <3 / Initial given by admissions (If response is "NO", the following Medicare IM given date fields will be blank) Date Medicare IM given:   Date Additional Medicare IM given:    Discharge Disposition:  HOME W HOME HEALTH SERVICES  Per UR Regulation:  Reviewed for med. necessity/level of care/duration of stay  If discussed at Long Length of Stay Meetings, dates discussed:    Comments:  11/23/2012 Colleen Can BSN RN CCM [phone] Pt discharged with [facility] in place. Services to start 11/24/2012.   11/22/2012 Colleen Can BSN RN CCM [phone] CM spoke with patient regarding discharge plans. Pt was recently transferred to 6E from 4th floor. post op rt knee replacemnt 04/14. Pt states she plans to go home to [place] where daughter and grand daughter live with her. They will be caregivers. States she already has tub bench, commode bench and RW. Has ramp access to home. Will use [facility] for [facility] services upon discharge. CM to follow.    11/19/2012- B CHANDLER RN,BSN,MHA

## 2012-11-22 NOTE — Progress Notes (Signed)
   Subjective: 3 Days Post-Op Procedure(s) (LRB): RIGHT TOTAL KNEE ARTHROPLASTY (Right) Patient reports pain as mild and moderate.   Patient seen in rounds with Dr. Lequita Halt.  She continues with nausea.  Reduced to Vicodin yesterday but still nauseated.  DC Vicodin and stay with the Ultram.  Keep today and maybe home tomorrow.  HGB down to 8.5.  Discussed blood this morning.  Will give two units which should help.  Also give suppository this morning. Patient is well, and has had no acute complaints or problems Plan is to go Home after hospital stay.  Objective: Vital signs in last 24 hours: Temp:  [97.6 F (36.4 C)-99.7 F (37.6 C)] 99.4 F (37.4 C) (04/17 0614) Pulse Rate:  [69-78] 69 (04/17 0409) Resp:  [16-18] 16 (04/17 0409) BP: (93-123)/(46-63) 98/60 mmHg (04/17 0409) SpO2:  [93 %-100 %] 93 % (04/17 0409)  Intake/Output from previous day:  Intake/Output Summary (Last 24 hours) at 11/22/12 0838 Last data filed at 11/21/12 2150  Gross per 24 hour  Intake    980 ml  Output    400 ml  Net    580 ml    Labs:  Recent Labs  11/20/12 0535 11/21/12 0446 11/22/12 0408  HGB 10.7* 9.5* 8.5*    Recent Labs  11/21/12 0446 11/22/12 0408  WBC 17.0* 11.1*  RBC 3.45* 3.00*  HCT 28.4* 25.0*  PLT 239 210    Recent Labs  11/20/12 0535 11/21/12 0446  NA 140 136  K 3.0* 3.7  CL 99 97  CO2 31 31  BUN 8 9  CREATININE 0.78 0.60  GLUCOSE 258* 189*  CALCIUM 8.7 9.0   No results found for this basename: LABPT, INR,  in the last 72 hours  EXAM General - Patient is Alert, Appropriate and Oriented Extremity - Neurovascular intact Sensation intact distally Dorsiflexion/Plantar flexion intact Compartment soft Dressing/Incision - clean, dry, no drainage, healing Motor Function - intact, moving foot and toes well on exam.   Past Medical History  Diagnosis Date  . Diabetes mellitus   . Hypertension   . Hypothyroidism   . COPD (chronic obstructive pulmonary disease)   .  Heart murmur   . GERD (gastroesophageal reflux disease)   . Arthritis   . 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  OFFICE NOTE DR. A. LITTLE STATES  "EXTREMELY PACEMAKER SENSITIVE AND WHEN YOU TRY TO CHECK FOR UNDERLYING RHYTHMS SHE WILL HAVE SNYCOPE AND WE HAVE NOT DONE THIS NOW IN ABOUT 2 YEARS".    Assessment/Plan: 3 Days Post-Op Procedure(s) (LRB): RIGHT TOTAL KNEE ARTHROPLASTY (Right) Principal Problem:   OA (osteoarthritis) of knee Active Problems:   Postop Hypokalemia   Postoperative anemia due to acute blood loss   Postop Transfusion  Estimated body mass index is 24.5 kg/(m^2) as calculated from the following:   Height as of this encounter: 5\' 2"  (1.575 m).   Weight as of this encounter: 60.782 kg (134 lb). Plan for discharge tomorrow Discharge home with home health  DVT Prophylaxis - Xarelto Weight-Bearing as tolerated to right leg  Emori Kamau 11/22/2012, 8:38 AM

## 2012-11-23 LAB — CBC
MCH: 27.6 pg (ref 26.0–34.0)
Platelets: 195 10*3/uL (ref 150–400)
RBC: 4.17 MIL/uL (ref 3.87–5.11)
WBC: 9.8 10*3/uL (ref 4.0–10.5)

## 2012-11-23 LAB — BASIC METABOLIC PANEL
Calcium: 9 mg/dL (ref 8.4–10.5)
GFR calc non Af Amer: >90 mL/min (ref >90)
Glucose, Bld: 145 mg/dL — ABNORMAL HIGH (ref 70–99)
Sodium: 134 mEq/L — ABNORMAL LOW (ref 135–145)

## 2012-11-23 LAB — GLUCOSE, CAPILLARY: Glucose-Capillary: 175 mg/dL — ABNORMAL HIGH (ref 70–99)

## 2012-11-23 LAB — TYPE AND SCREEN: Unit division: 0

## 2012-11-23 MED ORDER — TRAMADOL HCL 50 MG PO TABS: 50.0000 mg | ORAL_TABLET | Freq: Four times a day (QID) | ORAL | Status: DC | PRN

## 2012-11-23 MED ORDER — METHOCARBAMOL 500 MG PO TABS: 500.0000 mg | ORAL_TABLET | Freq: Four times a day (QID) | ORAL | Status: DC | PRN

## 2012-11-23 MED ORDER — RIVAROXABAN 10 MG PO TABS: 10.0000 mg | ORAL_TABLET | Freq: Every day | ORAL | Status: DC

## 2012-11-23 MED ORDER — HYDROCODONE-ACETAMINOPHEN 5-325 MG PO TABS: 1.0000 | ORAL_TABLET | Freq: Four times a day (QID) | ORAL | Status: DC | PRN

## 2012-11-23 NOTE — Progress Notes (Signed)
   Subjective: 4 Days Post-Op Procedure(s) (LRB): RIGHT TOTAL KNEE ARTHROPLASTY (Right) Patient reports pain as mild.  Feels much better today. Nausea and dizziness resolved Plan is to go Home after hospital stay.  Objective: Vital signs in last 24 hours: Temp:  [97.5 F (36.4 C)-98.8 F (37.1 C)] 98.3 F (36.8 C) (04/18 0708) Pulse Rate:  [69-79] 74 (04/18 0708) Resp:  [14-20] 20 (04/18 0708) BP: (102-167)/(65-89) 136/82 mmHg (04/18 0708) SpO2:  [94 %-98 %] 95 % (04/18 0827)  Intake/Output from previous day:  Intake/Output Summary (Last 24 hours) at 11/23/12 0841 Last data filed at 11/23/12 0708  Gross per 24 hour  Intake   1678 ml  Output      0 ml  Net   1678 ml    Intake/Output this shift: Total I/O In: 240 [P.O.:240] Out: -   Labs:  Recent Labs  11/21/12 0446 11/22/12 0408 11/23/12 0405  HGB 9.5* 8.5* 11.5*    Recent Labs  11/22/12 0408 11/23/12 0405  WBC 11.1* 9.8  RBC 3.00* 4.17  HCT 25.0* 33.5*  PLT 210 195    Recent Labs  11/21/12 0446 11/23/12 0405  NA 136 134*  K 3.7 3.0*  CL 97 94*  CO2 31 30  BUN 9 10  CREATININE 0.60 0.59  GLUCOSE 189* 145*  CALCIUM 9.0 9.0   No results found for this basename: LABPT, INR,  in the last 72 hours  EXAM General - Patient is Alert, Appropriate and Oriented Extremity - Neurologically intact Neurovascular intact Dorsiflexion/Plantar flexion intact Incision: dressing C/D/I No cellulitis present Compartment soft Dressing/Incision - clean, dry, no drainage Motor Function - intact, moving foot and toes well on exam.   Past Medical History  Diagnosis Date  . Diabetes mellitus   . Hypertension   . Hypothyroidism   . COPD (chronic obstructive pulmonary disease)   . Heart murmur   . GERD (gastroesophageal reflux disease)   . Arthritis   . 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 Pacemaker     PACEMAKER  DEPENDENT-DR. CROITORU - [facility].  OFFICE NOTE DR. A. LITTLE STATES  "EXTREMELY PACEMAKER SENSITIVE AND WHEN YOU TRY TO CHECK FOR UNDERLYING RHYTHMS SHE WILL HAVE SNYCOPE AND WE HAVE NOT DONE THIS NOW IN ABOUT 2 YEARS".    Assessment/Plan: 4 Days Post-Op Procedure(s) (LRB): RIGHT TOTAL KNEE ARTHROPLASTY (Right) Principal Problem:   OA (osteoarthritis) of knee Active Problems:   Postop Hypokalemia   Postoperative anemia due to acute blood loss   Postop Transfusion   Discharge home with home health  DVT Prophylaxis - Xarelto Weight-Bearing as tolerated to right  leg  Mercedes Barr V 11/23/2012, 8:41 AM

## 2012-11-23 NOTE — Progress Notes (Signed)
Physical Therapy Treatment Patient Details Name: Mercedes Barr MRN: [id number] DOB: August 12, 1945 Today's Date: 11/23/2012 Time: 3086-5784 PT Time Calculation (min): 25 min  PT Assessment / Plan / Recommendation Comments on Treatment Session  Progressing well with mobility. Plan is for home today. Recommend HHPT. Ready for d/c from PT standpoint    Follow Up Recommendations  Home health PT     Does the patient have the potential to tolerate intense rehabilitation     Barriers to Discharge        Equipment Recommendations  None recommended by PT    Recommendations for Other Services    Frequency 7X/week   Plan Discharge plan remains appropriate    Precautions / Restrictions Precautions Precautions: Knee Required Braces or Orthoses: Knee Immobilizer - Right Knee Immobilizer - Right: Discontinue once straight leg raise with < 10 degree lag Restrictions Weight Bearing Restrictions: No RLE Weight Bearing: Weight bearing as tolerated   Pertinent Vitals/Pain 8/10 R knee with activity    Mobility  Bed Mobility Bed Mobility: Supine to Sit;Sit to Supine Supine to Sit: 4: Min assist Sit to Supine: 4: Min assist Details for Bed Mobility Assistance: Assist for R LE onto/off bed.  Transfers Transfers: Sit to Stand;Stand to Sit Sit to Stand: 4: Min guard;From bed Stand to Sit: 4: Min guard;To bed Details for Transfer Assistance: verbal cues for safe technique including hand placement and R LE forward.  Ambulation/Gait Ambulation Distance (Feet): 135 Feet Assistive device: Rolling walker Gait Pattern: Step-to pattern;Antalgic;Decreased stride length Stairs: No    Exercises Total Joint Exercises Ankle Circles/Pumps: AROM;Both;10 reps;Supine Quad Sets: AROM;Both;10 reps;Supine Short Arc Quad: AAROM;Right;10 reps;Supine Heel Slides: AAROM;Right;10 reps;Supine Hip ABduction/ADduction: AAROM;Right;10 reps;Supine Straight Leg Raises: AAROM;10 reps;Supine;Right   PT Diagnosis:     PT Problem List:   PT Treatment Interventions:     PT Goals Acute Rehab PT Goals Pt will go Supine/Side to Sit: with supervision PT Goal: Supine/Side to Sit - Progress: Progressing toward goal Pt will go Sit to Supine/Side: with supervision PT Goal: Sit to Supine/Side - Progress: Progressing toward goal Pt will go Sit to Stand: with supervision PT Goal: Sit to Stand - Progress: Progressing toward goal Pt will go Stand to Sit: with supervision PT Goal: Stand to Sit - Progress: Progressing toward goal Pt will Ambulate: 51 - 150 feet;with supervision;with least restrictive assistive device PT Goal: Ambulate - Progress: Progressing toward goal Pt will Perform Home Exercise Program: with supervision, verbal cues required/provided PT Goal: Perform Home Exercise Program - Progress: Progressing toward goal  Visit Information  Last PT Received On: 11/23/12 Assistance Needed: +1    Subjective Data  Subjective: I am ready to go Patient Stated Goal: to return home.    Cognition  Cognition Arousal/Alertness: Awake/alert Behavior During Therapy: WFL for tasks assessed/performed Overall Cognitive Status: Within Functional Limits for tasks assessed    Balance     End of Session PT - End of Session Equipment Utilized During Treatment: Gait belt;Right knee immobilizer Activity Tolerance: Patient tolerated treatment well Patient left: in bed   GP     Rebeca Alert, PT Pager: [phone]

## 2012-12-03 NOTE — Telephone Encounter (Signed)
Asked pt if she received Rx @ discharge from Dr. Despina Hick.  Pt is sure she did not receive one.  Advised I would send a message to MD. Milas Gain, Maryjo [place]

## 2012-12-03 NOTE — Telephone Encounter (Signed)
Patient is calling back because back in April, she called requesting an order for a [employer] with 4 wheels, basket and a seat.  It looks like when that message was taken, some other things got involved and the Rolling Walker never got ordered.

## 2012-12-03 NOTE — Telephone Encounter (Signed)
Spoke with Dr. Tana Felts office.  Pt has appt there tomorrow (4/29) @ 230pm and if he agrees he can give her the Rx then.  Pt informed and agreeable. Chauna Osoria, Maryjo [place]

## 2012-12-09 NOTE — Discharge Summary (Signed)
Physician Discharge Summary   Patient ID: Mercedes Barr MRN: [id number] DOB/AGE: 66/28/1947 66 y.o.  Admit date: 11/19/2012 Discharge date: 11/23/2012  Primary Diagnosis:  Osteoarthritis Right knee  Admission Diagnoses:  Past Medical History  Diagnosis Date  . Diabetes mellitus   . Hypertension   . Hypothyroidism   . COPD (chronic obstructive pulmonary disease)   . Heart murmur   . GERD (gastroesophageal reflux disease)   . Arthritis   . 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  OFFICE NOTE DR. A. LITTLE STATES  "EXTREMELY PACEMAKER SENSITIVE AND WHEN YOU TRY TO CHECK FOR UNDERLYING RHYTHMS SHE WILL HAVE SNYCOPE AND WE HAVE NOT DONE THIS NOW IN ABOUT 2 YEARS".   Discharge Diagnoses:   Principal Problem:   OA (osteoarthritis) of knee Active Problems:   Postop Hypokalemia   Postoperative anemia due to acute blood loss   Postop Transfusion  Estimated body mass index is 24.5 kg/(m^2) as calculated from the following:   Height as of this encounter: 5\' 2"  (1.575 m).   Weight as of this encounter: 60.782 kg (134 lb).  Procedure:  Procedure(s) (LRB): RIGHT TOTAL KNEE ARTHROPLASTY (Right)   Consults: None  HPI: Mercedes Barr is a 67 y.o. year old female with end stage OA of her right knee with progressively worsening pain and dysfunction. She has constant pain, with activity and at rest and significant functional deficits with difficulties even with ADLs. She has had extensive non-op management including analgesics, injections of cortisone and home exercise program, but remains in significant pain with significant dysfunction.Radiographs show bone on bone arthritis lateral. She presents now for right Total Knee Arthroplasty.   Laboratory Data: Admission on 11/19/2012, Discharged on 11/23/2012    Component Date Value Range Status  . Glucose-Capillary 11/19/2012 150* 70 - 99 mg/dL Final  . Glucose-Capillary 11/19/2012 111* 70 - 99 mg/dL Final  . Comment 1 13/03/6577 Documented in Chart   Final  . Glucose-Capillary 11/19/2012 177* 70 - 99 mg/dL Final  . WBC 46/96/2952 10.1  4.0 - 10.5 K/uL Final  . RBC 11/20/2012 3.83* 3.87 - 5.11 MIL/uL Final  . Hemoglobin 11/20/2012 10.7* 12.0 - 15.0 g/dL Final  . HCT 84/13/2440 31.6* 36.0 - 46.0 % Final  . MCV 11/20/2012 82.5  78.0 - 100.0 fL Final  . MCH 11/20/2012 27.9  26.0 - 34.0 pg Final  . MCHC 11/20/2012 33.9  30.0 - 36.0 g/dL Final  . RDW 06/04/2535 12.6  11.5 - 15.5 % Final  . Platelets 11/20/2012 226  150 - 400 K/uL Final  . Sodium 11/20/2012 140  135 - 145 mEq/L Final  . Potassium 11/20/2012 3.0* 3.5 - 5.1 mEq/L Final  . Chloride 11/20/2012 99  96 - 112 mEq/L Final  . CO2 11/20/2012 31  19 - 32 mEq/L Final  . Glucose, Bld 11/20/2012 258* 70 - 99 mg/dL Final  . BUN 64/40/3474 8  6 - 23 mg/dL Final  . Creatinine, Ser 11/20/2012 0.78  0.50 - 1.10 mg/dL Final  . Calcium 25/95/6387 8.7  8.4 - 10.5 mg/dL Final  . GFR calc non Af Amer 11/20/2012 86* >90 mL/min Final  . GFR calc Af Amer 11/20/2012 >90  >90 mL/min Final   Comment:  The eGFR has been calculated                          using the CKD EPI equation.                          This calculation has not been                          validated in all clinical                          situations.                          eGFR's persistently                          <90 mL/min signify                          possible Chronic Kidney Disease.  . Glucose-Capillary 11/19/2012 229* 70 - 99 mg/dL Final  . Glucose-Capillary 11/20/2012 266* 70 - 99 mg/dL Final  . Glucose-Capillary 11/20/2012 228* 70 - 99 mg/dL Final  . WBC 16/05/9603 17.0* 4.0 - 10.5 K/uL Final  . RBC 11/21/2012 3.45* 3.87 - 5.11 MIL/uL Final  . Hemoglobin 11/21/2012 9.5* 12.0 - 15.0  g/dL Final  . HCT 54/04/8118 28.4* 36.0 - 46.0 % Final  . MCV 11/21/2012 82.3  78.0 - 100.0 fL Final  . MCH 11/21/2012 27.5  26.0 - 34.0 pg Final  . MCHC 11/21/2012 33.5  30.0 - 36.0 g/dL Final  . RDW 14/78/2956 13.0  11.5 - 15.5 % Final  . Platelets 11/21/2012 239  150 - 400 K/uL Final  . Sodium 11/21/2012 136  135 - 145 mEq/L Final  . Potassium 11/21/2012 3.7  3.5 - 5.1 mEq/L Final   Comment: REPEATED TO VERIFY                          DELTA CHECK NOTED                          NO VISIBLE HEMOLYSIS  . Chloride 11/21/2012 97  96 - 112 mEq/L Final  . CO2 11/21/2012 31  19 - 32 mEq/L Final  . Glucose, Bld 11/21/2012 189* 70 - 99 mg/dL Final  . BUN 21/30/8657 9  6 - 23 mg/dL Final  . Creatinine, Ser 11/21/2012 0.60  0.50 - 1.10 mg/dL Final  . Calcium 84/69/6295 9.0  8.4 - 10.5 mg/dL Final  . GFR calc non Af Amer 11/21/2012 >90  >90 mL/min Final  . GFR calc Af Amer 11/21/2012 >90  >90 mL/min Final   Comment:                                 The eGFR has been calculated                          using the CKD EPI equation.  This calculation has not been                          validated in all clinical                          situations.                          eGFR's persistently                          <90 mL/min signify                          possible Chronic Kidney Disease.  . Glucose-Capillary 11/20/2012 204* 70 - 99 mg/dL Final  . Glucose-Capillary 11/20/2012 204* 70 - 99 mg/dL Final  . Glucose-Capillary 11/21/2012 152* 70 - 99 mg/dL Final  . Glucose-Capillary 11/21/2012 182* 70 - 99 mg/dL Final  . Comment 1 14/78/2956 Documented in Chart   Final  . Comment 2 11/21/2012 Notify RN   Final  . Glucose-Capillary 11/21/2012 170* 70 - 99 mg/dL Final  . Comment 1 21/30/8657 Documented in Chart   Final  . Comment 2 11/21/2012 Notify RN   Final  . WBC 11/22/2012 11.1* 4.0 - 10.5 K/uL Final  . RBC 11/22/2012 3.00* 3.87 - 5.11 MIL/uL Final  . Hemoglobin  11/22/2012 8.5* 12.0 - 15.0 g/dL Final  . HCT 84/69/6295 25.0* 36.0 - 46.0 % Final  . MCV 11/22/2012 83.3  78.0 - 100.0 fL Final  . MCH 11/22/2012 28.3  26.0 - 34.0 pg Final  . MCHC 11/22/2012 34.0  30.0 - 36.0 g/dL Final  . RDW 28/41/3244 12.9  11.5 - 15.5 % Final  . Platelets 11/22/2012 210  150 - 400 K/uL Final  . Glucose-Capillary 11/21/2012 167* 70 - 99 mg/dL Final  . Comment 1 08/10/7251 Notify RN   Final  . Comment 2 11/21/2012 Documented in Chart   Final  . Glucose-Capillary 11/22/2012 163* 70 - 99 mg/dL Final  . Order Confirmation 11/22/2012 ORDER PROCESSED BY BLOOD BANK   Final  . Glucose-Capillary 11/22/2012 174* 70 - 99 mg/dL Final  . Sodium 66/44/0347 134* 135 - 145 mEq/L Final  . Potassium 11/23/2012 3.0* 3.5 - 5.1 mEq/L Final   Comment: DELTA CHECK NOTED                          REPEATED TO VERIFY  . Chloride 11/23/2012 94* 96 - 112 mEq/L Final  . CO2 11/23/2012 30  19 - 32 mEq/L Final  . Glucose, Bld 11/23/2012 145* 70 - 99 mg/dL Final  . BUN 42/59/5638 10  6 - 23 mg/dL Final  . Creatinine, Ser 11/23/2012 0.59  0.50 - 1.10 mg/dL Final  . Calcium 75/64/3329 9.0  8.4 - 10.5 mg/dL Final  . GFR calc non Af Amer 11/23/2012 >90  >90 mL/min Final  . GFR calc Af Amer 11/23/2012 >90  >90 mL/min Final   Comment:                                 The eGFR has been calculated  using the CKD EPI equation.                          This calculation has not been                          validated in all clinical                          situations.                          eGFR's persistently                          <90 mL/min signify                          possible Chronic Kidney Disease.  . WBC 11/23/2012 9.8  4.0 - 10.5 K/uL Final  . RBC 11/23/2012 4.17  3.87 - 5.11 MIL/uL Final  . Hemoglobin 11/23/2012 11.5* 12.0 - 15.0 g/dL Final   Comment: POST TRANSFUSION SPECIMEN                          DELTA CHECK NOTED  . HCT 11/23/2012 33.5* 36.0 - 46.0 %  Final  . MCV 11/23/2012 80.3  78.0 - 100.0 fL Final  . MCH 11/23/2012 27.6  26.0 - 34.0 pg Final  . MCHC 11/23/2012 34.3  30.0 - 36.0 g/dL Final  . RDW 16/05/9603 13.5  11.5 - 15.5 % Final  . Platelets 11/23/2012 195  150 - 400 K/uL Final  . Glucose-Capillary 11/22/2012 175* 70 - 99 mg/dL Final  . Glucose-Capillary 11/22/2012 144* 70 - 99 mg/dL Final  . Glucose-Capillary 11/23/2012 157* 70 - 99 mg/dL Final  . Comment 1 54/04/8118 Notify RN   Final  Hospital Outpatient Visit on 11/12/2012  Component Date Value Range Status  . MRSA, PCR 11/12/2012 NEGATIVE  NEGATIVE Final  . Staphylococcus aureus 11/12/2012 NEGATIVE  NEGATIVE Final   Comment:                                 The Xpert SA Assay (FDA                          approved for NASAL specimens                          in patients over 40 years of age),                          is one component of                          a comprehensive surveillance                          program.  Test performance has                          been validated by [employer]  [facility] for patients greater                          than or equal to 29 year old.                          It is not intended                          to diagnose infection nor to                          guide or monitor treatment.  Marland Kitchen aPTT 11/12/2012 36  24 - 37 seconds Final  . WBC 11/12/2012 5.2  4.0 - 10.5 K/uL Final  . RBC 11/12/2012 4.94  3.87 - 5.11 MIL/uL Final  . Hemoglobin 11/12/2012 13.8  12.0 - 15.0 g/dL Final  . HCT 16/05/9603 41.7  36.0 - 46.0 % Final  . MCV 11/12/2012 84.4  78.0 - 100.0 fL Final  . MCH 11/12/2012 27.9  26.0 - 34.0 pg Final  . MCHC 11/12/2012 33.1  30.0 - 36.0 g/dL Final  . RDW 54/04/8118 12.4  11.5 - 15.5 % Final  . Platelets 11/12/2012 253  150 - 400 K/uL Final  . Sodium 11/12/2012 140  135 - 145 mEq/L Final  . Potassium 11/12/2012 3.3* 3.5 - 5.1 mEq/L Final  . Chloride 11/12/2012 98  96 - 112 mEq/L Final  . CO2  11/12/2012 32  19 - 32 mEq/L Final  . Glucose, Bld 11/12/2012 260* 70 - 99 mg/dL Final  . BUN 14/78/2956 18  6 - 23 mg/dL Final  . Creatinine, Ser 11/12/2012 0.77  0.50 - 1.10 mg/dL Final  . Calcium 21/30/8657 10.3  8.4 - 10.5 mg/dL Final  . Total Protein 11/12/2012 7.5  6.0 - 8.3 g/dL Final  . Albumin 84/69/6295 4.3  3.5 - 5.2 g/dL Final  . AST 28/41/3244 23  0 - 37 U/L Final  . ALT 11/12/2012 26  0 - 35 U/L Final  . Alkaline Phosphatase 11/12/2012 50  39 - 117 U/L Final  . Total Bilirubin 11/12/2012 0.3  0.3 - 1.2 mg/dL Final  . GFR calc non Af Amer 11/12/2012 86* >90 mL/min Final  . GFR calc Af Amer 11/12/2012 >90  >90 mL/min Final   Comment:                                 The eGFR has been calculated                          using the CKD EPI equation.                          This calculation has not been                          validated in all clinical                          situations.                          eGFR's persistently                          <  90 mL/min signify                          possible Chronic Kidney Disease.  Marland Kitchen Prothrombin Time 11/12/2012 13.4  11.6 - 15.2 seconds Final  . INR 11/12/2012 1.03  0.00 - 1.49 Final  . ABO/RH(D) 11/12/2012 O POS   Final  . Antibody Screen 11/12/2012 NEG   Final  . Sample Expiration 11/12/2012 11/22/2012   Final  . Unit Number 11/12/2012 [id number]   Final  . Blood Component Type 11/12/2012 RBC LR PHER1   Final  . Unit division 11/12/2012 00   Final  . Status of Unit 11/12/2012 ISSUED,FINAL   Final  . Transfusion Status 11/12/2012 OK TO TRANSFUSE   Final  . Crossmatch Result 11/12/2012 Compatible   Final  . Unit Number 11/12/2012 [id number]   Final  . Blood Component Type 11/12/2012 RED CELLS,LR   Final  . Unit division 11/12/2012 00   Final  . Status of Unit 11/12/2012 ISSUED,FINAL   Final  . Transfusion Status 11/12/2012 OK TO TRANSFUSE   Final  . Crossmatch Result 11/12/2012 Compatible   Final  . Color,  Urine 11/12/2012 AMBER* YELLOW Final   BIOCHEMICALS MAY BE AFFECTED BY COLOR  . APPearance 11/12/2012 CLEAR  CLEAR Final  . Specific Gravity, Urine 11/12/2012 1.039* 1.005 - 1.030 Final  . pH 11/12/2012 5.5  5.0 - 8.0 Final  . Glucose, UA 11/12/2012 >1000* NEGATIVE mg/dL Final  . Hgb urine dipstick 11/12/2012 NEGATIVE  NEGATIVE Final  . Bilirubin Urine 11/12/2012 SMALL* NEGATIVE Final  . Ketones, ur 11/12/2012 40* NEGATIVE mg/dL Final  . Protein, ur 30/86/5784 NEGATIVE  NEGATIVE mg/dL Final  . Urobilinogen, UA 11/12/2012 1.0  0.0 - 1.0 mg/dL Final  . Nitrite 69/62/9528 NEGATIVE  NEGATIVE Final  . Leukocytes, UA 11/12/2012 NEGATIVE  NEGATIVE Final  . ABO/RH(D) 11/12/2012 O POS   Final  . Squamous Epithelial / LPF 11/12/2012 FEW* RARE Final  . WBC, UA 11/12/2012 3-6  <3 WBC/hpf Final  . RBC / HPF 11/12/2012 0-2  <3 RBC/hpf Final  . Bacteria, UA 11/12/2012 FEW* RARE Final  . Urine-Other 11/12/2012 RARE YEAST   Final  . Specimen Description 11/12/2012 URINE, RANDOM STAT   Final  . Special Requests 11/12/2012 NONE   Final  . Culture  Setup Time 11/12/2012 11/12/2012 19:39   Final  . Colony Count 11/12/2012 >=100,000 COLONIES/ML   Final  . Culture 11/12/2012 ENTEROCOCCUS SPECIES   Final  . Report Status 11/12/2012 11/14/2012 FINAL   Final  . Organism ID, Bacteria 11/12/2012 ENTEROCOCCUS SPECIES   Final  Office Visit on 10/25/2012  Component Date Value Range Status  . Source Wet Prep POC 10/25/2012 VAG   Final  . WBC, Wet Prep HPF POC 10/25/2012 1-5   Final  . Bacteria Wet Prep HPF POC 10/25/2012 2+ COCCI   Final  . Clue Cells Wet Prep HPF POC 10/25/2012 None   Final  . CLUE CELLS WET PREP WHIFF POC 10/25/2012 Negative Whiff   Final  . Yeast Wet Prep HPF POC 10/25/2012 None   Final  . Trichomonas Wet Prep HPF POC 10/25/2012 None   Final  Office Visit on 10/03/2012  Component Date Value Range Status  . Hemoglobin A1C 10/03/2012 8.6   Final  . Cholesterol 10/03/2012 120  0 - 200 mg/dL  Final   Comment: ATP III Classification:                                <  200        mg/dL        Desirable                               200 - 239     mg/dL        Borderline High                               >= 240        mg/dL        High                             . Triglycerides 10/03/2012 168* <150 mg/dL Final  . HDL 82/95/6213 36* >39 mg/dL Final  . Total CHOL/HDL Ratio 10/03/2012 3.3   Final  . VLDL 10/03/2012 34  0 - 40 mg/dL Final  . LDL Cholesterol 10/03/2012 50  0 - 99 mg/dL Final   Comment:                            Total Cholesterol/HDL Ratio:CHD Risk                                                 Coronary Heart Disease Risk Table                                                                 Men       Women                                   1/2 Average Risk              3.4        3.3                                       Average Risk              5.0        4.4                                    2X Average Risk              9.6        7.1                                    3X Average Risk             23.4       11.0  Use the calculated Patient Ratio above and the CHD Risk table                           to determine the patient's CHD Risk.                          ATP III Classification (LDL):                                < 100        mg/dL         Optimal                               100 - 129     mg/dL         Near or Above Optimal                               130 - 159     mg/dL         Borderline High                               160 - 189     mg/dL         High                                > 190        mg/dL         Very High                             . TSH 10/03/2012 2.347  0.350 - 4.500 uIU/mL Final  . Sodium 10/03/2012 139  135 - 145 mEq/L Final  . Potassium 10/03/2012 3.9  3.5 - 5.3 mEq/L Final  . Chloride 10/03/2012 100  96 - 112 mEq/L Final  . CO2 10/03/2012 27  19 - 32 mEq/L Final  . Glucose, Bld 10/03/2012 250* 70 -  99 mg/dL Final  . BUN 16/05/9603 13  6 - 23 mg/dL Final  . Creat 54/04/8118 0.79  0.50 - 1.10 mg/dL Final  . Calcium 14/78/2956 10.0  8.4 - 10.5 mg/dL Final  . WBC 21/30/8657 5.2  4.0 - 10.5 K/uL Final  . RBC 10/03/2012 4.77  3.87 - 5.11 MIL/uL Final  . Hemoglobin 10/03/2012 13.0  12.0 - 15.0 g/dL Final  . HCT 84/69/6295 39.1  36.0 - 46.0 % Final  . MCV 10/03/2012 82.0  78.0 - 100.0 fL Final  . MCH 10/03/2012 27.3  26.0 - 34.0 pg Final  . MCHC 10/03/2012 33.2  30.0 - 36.0 g/dL Final  . RDW 28/41/3244 14.2  11.5 - 15.5 % Final  . Platelets 10/03/2012 252  150 - 400 K/uL Final     X-Rays:Dg Chest 2 View  11/12/2012  *RADIOLOGY REPORT*  Clinical Data: Preoperative respiratory exam.  Osteoarthritis of the right knee.  CHEST - 2 VIEW  Comparison: Chest x-ray dated 01/07/2011  Findings: The heart size and pulmonary vascularity are normal. There are numerous pacing leads in place.  The lungs  are clear.  No effusions.  No acute osseous abnormality.  IMPRESSION: No acute disease.   Original Report Authenticated By: Francene Boyers, M.D.     EKG: Orders placed during the hospital encounter of 11/19/12  . EKG 12-LEAD  . EKG 12-LEAD  . EKG  . EKG     Hospital Course: Mercedes Barr is a 67 y.o. who was admitted to [facility]. They were brought to the operating room on 11/19/2012 and underwent Procedure(s): RIGHT TOTAL KNEE ARTHROPLASTY.  Patient tolerated the procedure well and was later transferred to the recovery room and then to the orthopaedic floor for postoperative care.  They were given PO and IV analgesics for pain control following their surgery.  They were given 24 hours of postoperative antibiotics of  Anti-infectives   Start     Dose/Rate Route Frequency Ordered Stop   11/19/12 1600  ceFAZolin (ANCEF) IVPB 1 g/50 mL premix     1 g 100 mL/hr over 30 Minutes Intravenous Every 6 hours 11/19/12 1315 11/19/12 2235   11/19/12 0645  ceFAZolin (ANCEF) IVPB 2 g/50 mL premix     2  g 100 mL/hr over 30 Minutes Intravenous On call to O.R. 11/19/12 4540 11/19/12 0940     and started on DVT prophylaxis in the form of Xarelto.   PT and OT were ordered for total joint protocol.  Discharge planning consulted to help with postop disposition and equipment needs.  Patient had a tough night on the evening of surgery.  They started to get up OOB with therapy on day one. Hemovac drain was pulled without difficulty.  Continued to work with therapy into day two.  Dressing was changed on day two and the incision was healing well.  By day three, the patient continued with nausea. Reduced to Vicodin but still nauseated. DC Vicodin and stay with the Ultram. Kept on day three. HGB down to 8.5. Blood that morning. Gave two units. On the following day, she progressed with therapy and meeting their goals.  Incision was healing well.  Patient was seen in rounds and was ready to go home.   Discharge Medications: Prior to Admission medications   Medication Sig Start Date End Date Taking? Authorizing Provider  albuterol (PROVENTIL HFA;VENTOLIN HFA) 108 (90 BASE) MCG/ACT inhaler Inhale 2 puffs into the lungs every 4 (four) hours as needed for wheezing. 11/15/12  Yes Ardyth Gal, MD  albuterol (PROVENTIL) (2.5 MG/3ML) 0.083% nebulizer solution Take 2.5 mg by nebulization every 4 (four) hours as needed for wheezing. As Needed.   Yes Historical Provider, MD  aspirin 81 MG tablet Take 81 mg by mouth daily.    Yes Historical Provider, MD  citalopram (CELEXA) 20 MG tablet Take 20 mg by mouth every morning. 06/08/12  Yes Ardyth Gal, MD  fluticasone (FLOVENT HFA) 110 MCG/ACT inhaler Inhale 1 puff into the lungs 2 (two) times daily.   Yes Historical Provider, MD  furosemide (LASIX) 20 MG tablet Take 20 mg by mouth every morning.   Yes Historical Provider, MD  glyBURIDE (DIABETA) 5 MG tablet Take 10 mg by mouth 2 (two) times daily with a meal. 10/23/12  Yes Ardyth Gal, MD  hydrochlorothiazide  (MICROZIDE) 12.5 MG capsule Take 12.5 mg by mouth every morning.   Yes Historical Provider, MD  levothyroxine (SYNTHROID, LEVOTHROID) 75 MCG tablet Take 75 mcg by mouth daily before breakfast. 10/15/12  Yes Ardyth Gal, MD  losartan (COZAAR) 50 MG tablet Take 50 mg by mouth every morning.  03/08/12  Yes Ardyth Gal, MD  metFORMIN (GLUCOPHAGE) 1000 MG tablet Take 1,000 mg by mouth 2 (two) times daily with a meal. 10/23/12  Yes Ardyth Gal, MD  omeprazole (PRILOSEC) 20 MG capsule Take 20 mg by mouth 2 (two) times daily.    Yes Historical Provider, MD  potassium chloride (KLOR-CON) 20 MEQ packet Take 20 mEq by mouth daily. 05/17/11  Yes Ardyth Gal, MD  propranolol (INDERAL) 40 MG tablet Take 40 mg by mouth 2 (two) times daily.   Yes Historical Provider, MD  sitaGLIPtin (JANUVIA) 100 MG tablet Take 100 mg by mouth every morning. 10/15/12  Yes Ardyth Gal, MD  Travoprost, BAK Free, (TRAVATAN) 0.004 % SOLN ophthalmic solution Place 1 drop into both eyes at bedtime.   Yes Historical Provider, MD  zolpidem (AMBIEN) 5 MG tablet Take 5 mg by mouth at bedtime as needed for sleep. 10/10/12  Yes Ardyth Gal, MD  EPINEPHrine (EPI-PEN) 0.3 mg/0.3 mL DEVI Inject 0.3 mg into the muscle once. For severe allergic reaction to bee sting 10/04/11   Ardyth Gal, MD  HYDROcodone-acetaminophen [facility]) 5-325 MG per tablet Take 1-2 tablets by mouth every 6 (six) hours as needed for pain. 11/23/12   Loanne Drilling, MD  losartan (COZAAR) 50 MG tablet TAKE 1 TABLET (50MG ) BY MOUTH DAILY 11/14/12   Shelly Rubenstein, MD  methocarbamol (ROBAXIN) 500 MG tablet Take 1 tablet (500 mg total) by mouth every 6 (six) hours as needed. 11/23/12   Loanne Drilling, MD  rivaroxaban (XARELTO) 10 MG TABS tablet Take 1 tablet (10 mg total) by mouth daily with breakfast. 11/23/12   Loanne Drilling, MD  traMADol (ULTRAM) 50 MG tablet Take 1-2 tablets (50-100 mg total) by mouth every 6 (six) hours as needed. 11/23/12    Loanne Drilling, MD  triamcinolone cream (KENALOG) 0.5 % Apply 1 application topically 2 (two) times daily as needed (itching). Apply to itchy areas two times a day until 2-3 days after healed. 11/20/12   Ardyth Gal, MD    Diet: Cardiac diet and Diabetic diet Activity:WBAT Follow-up:in 2 weeks Disposition - Home Discharged Condition: good      Medication List    STOP taking these medications       cyclobenzaprine 5 MG tablet  Commonly known as:  FLEXERIL      TAKE these medications       albuterol 108 (90 BASE) MCG/ACT inhaler  Commonly known as:  PROVENTIL HFA;VENTOLIN HFA  Inhale 2 puffs into the lungs every 4 (four) hours as needed for wheezing.     albuterol (2.5 MG/3ML) 0.083% nebulizer solution  Commonly known as:  PROVENTIL  Take 2.5 mg by nebulization every 4 (four) hours as needed for wheezing. As Needed.     aspirin 81 MG tablet  Take 81 mg by mouth daily.     citalopram 20 MG tablet  Commonly known as:  CELEXA  Take 20 mg by mouth every morning.     EPINEPHrine 0.3 mg/0.3 mL Devi  Commonly known as:  EPI-PEN  Inject 0.3 mg into the muscle once. For severe allergic reaction to bee sting     fluticasone 110 MCG/ACT inhaler  Commonly known as:  FLOVENT HFA  Inhale 1 puff into the lungs 2 (two) times daily.     furosemide 20 MG tablet  Commonly known as:  LASIX  Take 20 mg by mouth every morning.     glyBURIDE 5 MG tablet  Commonly known as:  DIABETA  Take 10 mg by mouth 2 (two) times daily with a meal.     hydrochlorothiazide 12.5 MG capsule  Commonly known as:  MICROZIDE  Take 12.5 mg by mouth every morning.     HYDROcodone-acetaminophen 5-325 MG per tablet  Commonly known as:  NORCO  Take 1-2 tablets by mouth every 6 (six) hours as needed for pain.     levothyroxine 75 MCG tablet  Commonly known as:  SYNTHROID, LEVOTHROID  Take 75 mcg by mouth daily before breakfast.     losartan 50 MG tablet  Commonly known as:  COZAAR  Take 50  mg by mouth every morning.     losartan 50 MG tablet  Commonly known as:  COZAAR  TAKE 1 TABLET (50MG ) BY MOUTH DAILY     metFORMIN 1000 MG tablet  Commonly known as:  GLUCOPHAGE  Take 1,000 mg by mouth 2 (two) times daily with a meal.     methocarbamol 500 MG tablet  Commonly known as:  ROBAXIN  Take 1 tablet (500 mg total) by mouth every 6 (six) hours as needed.     omeprazole 20 MG capsule  Commonly known as:  PRILOSEC  Take 20 mg by mouth 2 (two) times daily.     potassium chloride 20 MEQ packet  Commonly known as:  KLOR-CON  Take 20 mEq by mouth daily.     propranolol 40 MG tablet  Commonly known as:  INDERAL  Take 40 mg by mouth 2 (two) times daily.     rivaroxaban 10 MG Tabs tablet  Commonly known as:  XARELTO  Take 1 tablet (10 mg total) by mouth daily with breakfast.     sitaGLIPtin 100 MG tablet  Commonly known as:  JANUVIA  Take 100 mg by mouth every morning.     traMADol 50 MG tablet  Commonly known as:  ULTRAM  Take 1-2 tablets (50-100 mg total) by mouth every 6 (six) hours as needed.     Travoprost (BAK Free) 0.004 % Soln ophthalmic solution  Commonly known as:  TRAVATAN  Place 1 drop into both eyes at bedtime.     triamcinolone cream 0.5 %  Commonly known as:  KENALOG  Apply 1 application topically 2 (two) times daily as needed (itching). Apply to itchy areas two times a day until 2-3 days after healed.     zolpidem 5 MG tablet  Commonly known as:  AMBIEN  Take 5 mg by mouth at bedtime as needed for sleep.           Follow-up Information   Follow up with Loanne Drilling, MD. Schedule an appointment as soon as possible for a visit on 12/04/2012. (Call [phone] tomorrow to make the appointment)    Contact information:   [address], [address] [place] [address] [phone]       Signed: Patrica Duel 12/09/2012, 10:33 PM

## 2012-12-17 ENCOUNTER — Other Ambulatory Visit: Payer: Self-pay | Admitting: Physical Medicine and Rehabilitation

## 2012-12-17 DIAGNOSIS — Z96659 Presence of unspecified artificial knee joint: Secondary | ICD-10-CM

## 2012-12-19 ENCOUNTER — Other Ambulatory Visit: Payer: Medicare Other

## 2012-12-21 ENCOUNTER — Ambulatory Visit (INDEPENDENT_AMBULATORY_CARE_PROVIDER_SITE_OTHER): Payer: Medicare Other | Admitting: Family Medicine

## 2012-12-21 ENCOUNTER — Ambulatory Visit
Admission: RE | Admit: 2012-12-21 | Discharge: 2012-12-21 | Disposition: A | Discharge status: Home / Self Care | Payer: Medicare Other | Source: Ambulatory Visit | Attending: Physical Medicine and Rehabilitation | Admitting: Physical Medicine and Rehabilitation

## 2012-12-21 ENCOUNTER — Encounter: Payer: Self-pay | Admitting: Family Medicine

## 2012-12-21 VITALS — BP 112/55 | HR 77 | Temp 97.9°F | Ht 62.5 in | Wt 126.0 lb

## 2012-12-21 DIAGNOSIS — R634 Abnormal weight loss: Secondary | ICD-10-CM

## 2012-12-21 DIAGNOSIS — Z96659 Presence of unspecified artificial knee joint: Secondary | ICD-10-CM

## 2012-12-21 DIAGNOSIS — E1165 Type 2 diabetes mellitus with hyperglycemia: Secondary | ICD-10-CM

## 2012-12-21 DIAGNOSIS — R112 Nausea with vomiting, unspecified: Secondary | ICD-10-CM

## 2012-12-21 HISTORY — DX: Abnormal weight loss: R63.4

## 2012-12-21 LAB — CBC WITH DIFFERENTIAL/PLATELET
Basophils Absolute: 0 10*3/uL (ref 0.0–0.1)
Eosinophils Relative: 4 % (ref 0–5)
Lymphocytes Relative: 35 % (ref 12–46)
Neutro Abs: 2.8 10*3/uL (ref 1.7–7.7)
Neutrophils Relative %: 50 % (ref 43–77)
Platelets: 327 10*3/uL (ref 150–400)
RDW: 13.5 % (ref 11.5–15.5)
WBC: 5.5 10*3/uL (ref 4.0–10.5)

## 2012-12-21 LAB — COMPREHENSIVE METABOLIC PANEL
ALT: 12 U/L (ref 0–35)
AST: 17 U/L (ref 0–37)
Albumin: 3.7 g/dL (ref 3.5–5.2)
Alkaline Phosphatase: 53 U/L (ref 39–117)
BUN: 13 mg/dL (ref 6–23)
CO2: 32 mEq/L (ref 19–32)
Calcium: 9.8 mg/dL (ref 8.4–10.5)
Chloride: 99 mEq/L (ref 96–112)
Creat: 0.68 mg/dL (ref 0.50–1.10)
Glucose, Bld: 215 mg/dL — ABNORMAL HIGH (ref 70–99)
Potassium: 4.2 mEq/L (ref 3.5–5.3)
Sodium: 141 mEq/L (ref 135–145)
Total Bilirubin: 0.3 mg/dL (ref 0.3–1.2)
Total Protein: 6.6 g/dL (ref 6.0–8.3)

## 2012-12-21 LAB — POCT GLYCOSYLATED HEMOGLOBIN (HGB A1C): Hemoglobin A1C: 8.3

## 2012-12-21 MED ORDER — ONDANSETRON HCL 4 MG PO TABS: 4.0000 mg | ORAL_TABLET | Freq: Three times a day (TID) | ORAL | Status: DC | PRN

## 2012-12-21 MED ORDER — TRAMADOL HCL 50 MG PO TABS: 50.0000 mg | ORAL_TABLET | Freq: Four times a day (QID) | ORAL | Status: DC | PRN

## 2012-12-21 MED ORDER — POLYETHYLENE GLYCOL 3350 17 GM/SCOOP PO POWD: 17.0000 g | Freq: Two times a day (BID) | ORAL | Status: DC

## 2012-12-21 MED ORDER — GLYCERIN (LAXATIVE) 2 G RE SUPP: 1.0000 | Freq: Every day | RECTAL | Status: DC | PRN

## 2012-12-21 NOTE — Patient Instructions (Signed)
I am sorry you are not feeling good.  I think you have a few things causing your nausea and vomiting.  The new medications methocarbamol and gabapentin can both cause nausea and vomiting.  I want you to only take the methocarbamol as needed for muscle spasm.  If your nausea does not improve, we may need to try a lower dose of gabapentin next.  Also, hydrocodone can cause nausea, so use it sparingly.  I have refilled your tramadol.   I also think you are constipated, which can cause nausea.  Please try the glycerin suppositories and start taking MiraLAX, one capful mixed in water in the morning, and once again in the evening.  I am also sending in a prescription for a nausea medication called zofran.   I am going to check some labs to see if there is any other cause of your weight loss and vomiting.  I will send you a letter with your lab results, or call you if anything is abnormal.

## 2012-12-21 NOTE — Assessment & Plan Note (Signed)
Unclear if this is the cause of her weight loss, but it is possible.  She is certainly constipated, which we will try to improve with glycerin suppositories and MiraLAX.  Also rx for zofran to help control symptoms while doing work up.  I am concerned that the combination of hydrocodone, methocarbamol and gabapentin could be causing nausea.  Will have her only use hydrocodone and methocarbamol as needed for right now, and continue gabapentin and refill on tramadol written today.  Follow up in 2 weeks to see if she is improving.

## 2012-12-21 NOTE — Assessment & Plan Note (Signed)
Stable, not at goal but close.  Given other issues going on today, will not make medication changes, will wait until she has improved clinically.

## 2012-12-21 NOTE — Assessment & Plan Note (Signed)
My records indicate 18 lb weight loss in about two months, which is certainly concerning.  Given nausea/vomiting, may be due to surgery and medications, constipation, but will begin work up with CMET, CBC with diff, and TSH.

## 2012-12-21 NOTE — Progress Notes (Signed)
  Subjective:    Patient ID: Mercedes Barr, female    DOB: April 26, 1946, 67 y.o.   MRN: [id number]  HPI:  Mercedes Barr comes in with complaints of nausea, vomiting, and weight loss.  She had her knee replaced about one month ago and has been on narcotics for pain management.  She has not had a bowel movement in 5 days. She has lost 18 lbs since March unintentionally.  She denies fevers.  She says she has tried eating small meals 6 times a day and this helps some, but she still is very nauseated and vomits if she eats too much. She did require a blood transfusion when she had her knee replacement surgery.   She also has seen her neurosurgeon about the discs in her back.  They have ordered a CT scan of her back.  She says they have put her on methocarbamol and gabapentin for muscle spasm and nerve pain.  These medications seem to help her pain.    She denies blood/bile in vomit, denies blood in stool, is up to date on colonoscopy.   DM: Taking medications, but says she has not been monitoring her sugars due to feeling so poorly.    Past Medical History  Diagnosis Date  . Diabetes mellitus   . Hypertension   . Hypothyroidism   . COPD (chronic obstructive pulmonary disease)   . Heart murmur   . GERD (gastroesophageal reflux disease)   . Arthritis   . 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  OFFICE NOTE DR. A. LITTLE STATES  "EXTREMELY PACEMAKER SENSITIVE AND WHEN YOU TRY TO CHECK FOR UNDERLYING RHYTHMS SHE WILL HAVE SNYCOPE AND WE HAVE NOT DONE THIS NOW IN ABOUT 2 YEARS".    History  Substance Use Topics  . Smoking status: Never Smoker   . Smokeless tobacco: Never Used  . Alcohol Use: No    Family History  Problem Relation Age of Onset  . Heart failure Mother   . Heart failure Sister    ROS Pertinent items in  HPI    Objective:  Physical Exam:  BP 112/55  Pulse 77  Temp(Src) 97.9 F (36.6 C) (Oral)  Ht 5' 2.5" (1.588 m)  Wt 126 lb (57.153 kg)  BMI 22.66 kg/m2 General appearance: alert, cooperative and no distress Head: Normocephalic, without obvious abnormality, atraumatic Lungs: clear to auscultation bilaterally Heart: regular rate and rhythm, S1, S2 normal, no murmur, click, rub or gallop Pulses: 2+ and symmetric       Assessment & Plan:

## 2012-12-25 ENCOUNTER — Telehealth: Payer: Self-pay | Admitting: Family Medicine

## 2012-12-25 NOTE — Telephone Encounter (Signed)
Pt called to say that the mail order pharmacy cannot send Tramadol because it is a controlled substance,  Needs it called to [facility] [address]

## 2012-12-26 NOTE — Telephone Encounter (Signed)
Patient is calling back because she is out of the Tramadol and needs it sent to [facility] [address].  She wishes she had known that [facility] wasn't going to be able to deliver it.

## 2012-12-26 NOTE — Telephone Encounter (Signed)
Called in Tramadol 50 mg 1-2 tablets every 6(six) hours prn, disp #80 with 1 Refill to [facility] [address] [phone]) per Dr.Chamberlain.  Dahmir Epperly, Darlyne Russian, CMA

## 2013-01-03 ENCOUNTER — Ambulatory Visit (INDEPENDENT_AMBULATORY_CARE_PROVIDER_SITE_OTHER): Payer: Medicare Other | Admitting: Family Medicine

## 2013-01-03 VITALS — BP 106/67 | HR 79 | Temp 98.1°F | Ht 62.0 in | Wt 124.0 lb

## 2013-01-03 DIAGNOSIS — F329 Major depressive disorder, single episode, unspecified: Secondary | ICD-10-CM

## 2013-01-03 DIAGNOSIS — R112 Nausea with vomiting, unspecified: Secondary | ICD-10-CM

## 2013-01-03 DIAGNOSIS — R634 Abnormal weight loss: Secondary | ICD-10-CM

## 2013-01-03 MED ORDER — METOCLOPRAMIDE HCL 5 MG PO TABS: 5.0000 mg | ORAL_TABLET | Freq: Four times a day (QID) | ORAL | Status: DC

## 2013-01-03 MED ORDER — CITALOPRAM HYDROBROMIDE 20 MG PO TABS: 20.0000 mg | ORAL_TABLET | Freq: Every morning | ORAL | Status: DC

## 2013-01-03 NOTE — Assessment & Plan Note (Signed)
Pt has been on celexa in the past- will re-start celexa at 20 mg dose.  Discussed importance of taking it daily.  Will have her follow up with me in 1 month.

## 2013-01-03 NOTE — Patient Instructions (Signed)
It was good to see you.  I am concerned you are having some depression that is causing you to feel tired and not hungry.  I want you to start taking Celexa, one pill every day.  Remember you have to take the pill every day for it to work.  It will take several weeks before you notice a difference.   For your nausea, I also want you to try metoclopramide before each meal and at bedtime to help with your nausea.  Try to stick to small meals and a bland diet.    Please make an appointment to see me at the end of next month so I can make sure you are doing better.   Also, be sure to start checking your blood sugars again, at least once a day in the morning.

## 2013-01-03 NOTE — Assessment & Plan Note (Signed)
CBC with mild anemia, improved after surgery (required transfusion).  CMET, TSH normal.  No complaints of muscle pains, systemic illness.  Suspect this is from poor PO intake, see depression and  Nausea/vomiting.

## 2013-01-03 NOTE — Assessment & Plan Note (Signed)
Concern this may be gastroparesis, perhaps anesthesia from surgery worsened it.  Will do trial of reglan QID to see if this improves appetite/weight loss.  Also complicated by depression, will re-start SSRI.

## 2013-01-03 NOTE — Progress Notes (Signed)
  Subjective:    Patient ID: Mercedes Barr, female    DOB: 1945-09-22, 67 y.o.   MRN: [id number]  HPI:  Mercedes Barr comes in to Geriatrics clinic for assessment.  She has not been feeling well since her knee surgery about 6 weeks ago.  She says her pain is well controlled on tramadol right now.  However, she complains of fatigue, especially after she walks and does her home physical therapy, she just feels exhausted.  Her mood is not very good, she feels down and has not been sleeping.   She continues to have nausea and occasional vomiting.  She has lost 2 more lbs since last week.  Says sometimes she is up all night with vomiting. She denies night sweats, fevers, adenopathy, easy bruising or bleeding.  No blood in her stool.   She is taking her DM medications as prescribed but says she has not been checking her blood sugars since her surgery, says she has just not felt like doing it.  Denies hypoglycemic or hyperglycemic symptoms.    Past Medical History  Diagnosis Date  . Diabetes mellitus   . Hypertension   . Hypothyroidism   . COPD (chronic obstructive pulmonary disease)   . Heart murmur   . GERD (gastroesophageal reflux disease)   . Arthritis   . 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  OFFICE NOTE DR. A. LITTLE STATES  "EXTREMELY PACEMAKER SENSITIVE AND WHEN YOU TRY TO CHECK FOR UNDERLYING RHYTHMS SHE WILL HAVE SNYCOPE AND WE HAVE NOT DONE THIS NOW IN ABOUT 2 YEARS".    History  Substance Use Topics  . Smoking status: Never Smoker   . Smokeless tobacco: Never Used  . Alcohol Use: No    Family History  Problem Relation Age of Onset  . Heart failure Mother   . Heart failure Sister      ROS Pertinent items in HPI    Objective:  Physical Exam:  BP 106/67  Pulse 79  Temp(Src) 98.1 F (36.7 C)  (Oral)  Ht 5\' 2"  (1.575 m)  Wt 124 lb (56.246 kg)  BMI 22.67 kg/m2 General appearance: alert, cooperative and no distress Head: Normocephalic, without obvious abnormality, atraumatic Lungs: clear to auscultation bilaterally Heart: regular rate and rhythm, S1, S2 normal, no murmur, click, rub or gallop Pulses: 2+ and symmetric Psych: Affect is normal but mood appears depressed.  Thought content seems normal, judgement and insight in tact.   PHQ9: 15, no SI/HI.        Assessment & Plan:

## 2013-01-04 ENCOUNTER — Ambulatory Visit: Payer: Medicare Other | Admitting: Family Medicine

## 2013-01-17 ENCOUNTER — Other Ambulatory Visit: Payer: Self-pay | Admitting: Family Medicine

## 2013-01-18 ENCOUNTER — Other Ambulatory Visit: Payer: Self-pay | Admitting: Family Medicine

## 2013-01-25 ENCOUNTER — Other Ambulatory Visit: Payer: Self-pay | Admitting: Family Medicine

## 2013-01-30 ENCOUNTER — Telehealth: Payer: Self-pay | Admitting: Family Medicine

## 2013-01-30 ENCOUNTER — Ambulatory Visit (INDEPENDENT_AMBULATORY_CARE_PROVIDER_SITE_OTHER): Payer: Medicare Other | Admitting: Family Medicine

## 2013-01-30 VITALS — BP 102/69 | HR 78 | Temp 97.8°F | Wt 118.5 lb

## 2013-01-30 DIAGNOSIS — R634 Abnormal weight loss: Secondary | ICD-10-CM

## 2013-01-30 DIAGNOSIS — F329 Major depressive disorder, single episode, unspecified: Secondary | ICD-10-CM

## 2013-01-30 DIAGNOSIS — R109 Unspecified abdominal pain: Secondary | ICD-10-CM

## 2013-01-30 DIAGNOSIS — R112 Nausea with vomiting, unspecified: Secondary | ICD-10-CM

## 2013-01-30 LAB — POCT SEDIMENTATION RATE: POCT SED RATE: 18 mm/hr (ref 0–22)

## 2013-01-30 MED ORDER — PROMETHAZINE HCL 12.5 MG PO TABS: 12.5000 mg | ORAL_TABLET | Freq: Three times a day (TID) | ORAL | Status: DC | PRN

## 2013-01-30 NOTE — Assessment & Plan Note (Signed)
Unclear etiology= tried reglan which made her worse.  Will Rx zofran and phenergan, refer to GI.

## 2013-01-30 NOTE — Telephone Encounter (Signed)
Patient was seen 6/25 was told by Dr. Lula Olszewski that she was  Going to give her a referral to a Gastro Dr. She wants to know which Gastro Dr. She should go to or will we make the appointment. JW

## 2013-01-30 NOTE — Progress Notes (Signed)
  Subjective:    Patient ID: Mercedes Barr, female    DOB: 1946-04-12, 67 y.o.   MRN: [id number]  HPI:  Mercedes Barr comes in for follow up.  She still does not feel well.  She has lost more weight.  She continues to have nausea, poor appetite, and vomits if she eats much.  She complains of lower abdominal pain, especially in her lower abdomen in the morning.  Was constipated but now is having diarrhea, no blood in her stool.    She tried the metoclopramide, but says it made her worse, with more vomiting.    She says she is taking the Celexa every other day, she thought that was how she [place] supposed to be taking it.  She says her mood is still down, but a little better.  No SI/HI.   Past Medical History  Diagnosis Date  . Diabetes mellitus   . Hypertension   . Hypothyroidism   . COPD (chronic obstructive pulmonary disease)   . Heart murmur   . GERD (gastroesophageal reflux disease)   . Arthritis   . 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  OFFICE NOTE DR. A. LITTLE STATES  "EXTREMELY PACEMAKER SENSITIVE AND WHEN YOU TRY TO CHECK FOR UNDERLYING RHYTHMS SHE WILL HAVE SNYCOPE AND WE HAVE NOT DONE THIS NOW IN ABOUT 2 YEARS".    History  Substance Use Topics  . Smoking status: Never Smoker   . Smokeless tobacco: Never Used  . Alcohol Use: No    Family History  Problem Relation Age of Onset  . Heart failure Mother   . Heart failure Sister      ROS She denies fevers, chills, adenopathy, easy bruising bleeding.  +Fatigue, knee pain.     Objective:  Physical Exam:  BP 102/69  Pulse 78  Temp(Src) 97.8 F (36.6 C) (Oral)  Wt 118 lb 8 oz (53.751 kg)  BMI 21.67 kg/m2 General appearance: alert, cooperative and no distress Head: Normocephalic, without obvious abnormality, atraumatic Lymph: No axillary,  cervical or supraclavicular palpable nodes Lungs: clear to auscultation bilaterally Heart: regular rate and rhythm, S1, S2 normal, no murmur, click, rub or gallop Abd: +bs, soft, mild diffuse TTP, no rebound or guarding.  Pulses: 2+ and symmetric       Assessment & Plan:

## 2013-01-30 NOTE — Addendum Note (Signed)
Addended by: [place], Shamira Toutant on: 01/30/2013 05:19 PM   Modules accepted: Orders

## 2013-01-30 NOTE — Telephone Encounter (Signed)
Pt wants to use dr Marina Goodell at [facility]

## 2013-01-30 NOTE — Patient Instructions (Signed)
It was good to see you, I am sorry you are still not feeling well.  I am going to refer you to go see the Gastroenterologist.  I also have ordered a CAT scan of your belly, please be sure to keep that appointment.   I will send you a letter with your lab results, or call you if anything is abnormal.    I sent in a medication called phenergan to try for nausea.  It can make you sleepy.   Please take the Celexa every day for your mood.

## 2013-01-30 NOTE — Assessment & Plan Note (Signed)
Suspect depression complicating (but not causing) clinical picture.  Stressed importance of taking celexa every day.  Consider increasing once she is taking daily.

## 2013-01-30 NOTE — Assessment & Plan Note (Signed)
Patient down about 25 lbs since March, this is progressive, no obvious cause.  She does associate it with starting at the time of her knee surgery.  Will Rx zofran and phenergan.  Will check Cortisol to make sure no pituitary abnormalities, and check ESR to check for autoimmune/inflammatory processes.  While labwork to this point normal, weight loss concerning for malignancy, will check CT abdomen/pelvis, and refer her to Gastroenterology.

## 2013-01-30 NOTE — Telephone Encounter (Signed)
LM that we would call her when appt made, but if she knew of a GI that takes her ins and that she wants to see , she may call and request.  Radene Ou, CMA

## 2013-02-01 ENCOUNTER — Telehealth: Payer: Self-pay | Admitting: Family Medicine

## 2013-02-01 ENCOUNTER — Other Ambulatory Visit: Payer: Self-pay | Admitting: Family Medicine

## 2013-02-01 ENCOUNTER — Ambulatory Visit (HOSPITAL_COMMUNITY)
Admission: RE | Admit: 2013-02-01 | Discharge: 2013-02-01 | Disposition: A | Discharge status: Home / Self Care | Payer: Medicare Other | Source: Ambulatory Visit | Attending: Family Medicine | Admitting: Family Medicine

## 2013-02-01 ENCOUNTER — Encounter (HOSPITAL_COMMUNITY): Payer: Self-pay

## 2013-02-01 DIAGNOSIS — R634 Abnormal weight loss: Secondary | ICD-10-CM

## 2013-02-01 DIAGNOSIS — R109 Unspecified abdominal pain: Secondary | ICD-10-CM

## 2013-02-01 DIAGNOSIS — Z950 Presence of cardiac pacemaker: Secondary | ICD-10-CM | POA: Insufficient documentation

## 2013-02-01 DIAGNOSIS — R1013 Epigastric pain: Secondary | ICD-10-CM | POA: Insufficient documentation

## 2013-02-01 DIAGNOSIS — D259 Leiomyoma of uterus, unspecified: Secondary | ICD-10-CM | POA: Insufficient documentation

## 2013-02-01 DIAGNOSIS — R110 Nausea: Secondary | ICD-10-CM | POA: Insufficient documentation

## 2013-02-01 MED ORDER — IOHEXOL 300 MG/ML  SOLN
100.0000 mL | Freq: Once | INTRAMUSCULAR | Status: AC | PRN
  Administered 2013-02-01: 100 mL via INTRAVENOUS

## 2013-02-01 NOTE — Telephone Encounter (Signed)
Called notified patient CT abdomen pelvis normal- no cause of her nausea/vomiting and weight loss.  She has not heard about her GI appointment yet.  Does not think nausea medication is working.  I let her know I would make sure we get her scheduled to see GI.

## 2013-02-01 NOTE — Telephone Encounter (Signed)
The referral has been placed in [facility] workqueue. Leshawn Houseworth, Maryjo Rochester

## 2013-02-01 NOTE — Telephone Encounter (Signed)
I put in order and wrote letter

## 2013-02-05 ENCOUNTER — Other Ambulatory Visit: Payer: Self-pay | Admitting: *Deleted

## 2013-02-06 ENCOUNTER — Telehealth: Payer: Self-pay | Admitting: *Deleted

## 2013-02-06 NOTE — Telephone Encounter (Signed)
This was prescribed in April 2014 but I see no documentation of a problem for which it was prescribed. Will refuse but if pt is having issues and needs this, I would like to see her to evaluate.

## 2013-02-06 NOTE — Telephone Encounter (Signed)
[facility] requesting Klor-Con [address] refilled.  Was last filled in June by Dr. Lula Olszewski. Will fwd to Md.  Rykar Lebleu, Darlyne Russian, CMA

## 2013-02-07 ENCOUNTER — Other Ambulatory Visit: Payer: Self-pay | Admitting: *Deleted

## 2013-02-07 NOTE — Telephone Encounter (Signed)
I am confused. I see that 02/01/13 it was listed as a "refill" encounter with 90 tabs but then it says "refused" by [name]. Any idea why this was refused?

## 2013-02-07 NOTE — Telephone Encounter (Signed)
The only thing that I can think of is that is responded to via fax  - I don't specifically remember this refill but since I listed it  as responded by other means it had to have been fax request.   [facility] pharmacy called and it was last filled on April 14 - (last shipped) - refill did go through today and has been processed. Pt had not received from another pharmacy, has not missed any meds. Wyatt Haste, RN-BSN

## 2013-02-10 NOTE — Telephone Encounter (Signed)
Re: rx for triamcinolone cream, see "skin lesion of left leg" in problem list with no documentation of triamcinolone cream, so unsure for what problem this is prescribed or if it is needed now. Please call pt to inquire and if needed, I will fill rx request. Thanks. Leona Singleton, MD

## 2013-02-10 NOTE — Telephone Encounter (Signed)
Received message from [name] that issue dealt with [name], does this mean that you found out it was refilled via fax? If so, just make sure you are documenting as such so it is easy for providers to see (maybe you did this and I missed it). Thanks!

## 2013-02-11 NOTE — Telephone Encounter (Signed)
Spoke with pt.  She states that the cream is for certain spots that become itchy at times.  Pt states "please dont stop it because it really helps"  Will forward back to MD. Milas Gain, Maryjo Rochester

## 2013-02-12 MED ORDER — TRIAMCINOLONE ACETONIDE 0.5 % EX CREA: 1.0000 "application " | TOPICAL_CREAM | Freq: Two times a day (BID) | CUTANEOUS | Status: DC | PRN

## 2013-02-12 NOTE — Telephone Encounter (Signed)
Will approve since patient is using.

## 2013-02-12 NOTE — Telephone Encounter (Signed)
Pt informed. Nayelly Laughman Dawn  

## 2013-02-14 ENCOUNTER — Other Ambulatory Visit: Payer: Self-pay

## 2013-02-28 ENCOUNTER — Ambulatory Visit: Payer: Medicare Other | Admitting: Family Medicine

## 2013-03-07 ENCOUNTER — Telehealth: Payer: Self-pay | Admitting: Family Medicine

## 2013-03-07 NOTE — Telephone Encounter (Signed)
Pt is requesting that Dr. Benjamin Stain send a referral for her to go to [facility] for the fungus on her foot. JW

## 2013-03-07 NOTE — Telephone Encounter (Signed)
Will forward to MD.  

## 2013-03-14 NOTE — Telephone Encounter (Signed)
Patient is calling to check the status of the referral because she is a diabetic and is concerned about both of her big toes.  There are black spots on each and she doesn't know if it is a fungus or ingrown toenail.

## 2013-03-14 NOTE — Telephone Encounter (Signed)
appt made with Mercedes Barr due to pcp not having anything soon enough.  Mercedes Barr,CMA

## 2013-03-14 NOTE — Telephone Encounter (Signed)
Please call and let patient know I would like to see her and evaluate the foot prior to referral - this is clinic policy. Thank her for following up on it. Have her make an appointment when I am next available or with another provider if she needs to be seen sooner. Thanks!  Leona Singleton, MD

## 2013-03-19 ENCOUNTER — Ambulatory Visit: Payer: Medicare Other | Admitting: Family Medicine

## 2013-03-27 ENCOUNTER — Encounter: Payer: Self-pay | Admitting: Cardiology

## 2013-03-27 ENCOUNTER — Telehealth: Payer: Self-pay | Admitting: *Deleted

## 2013-03-27 DIAGNOSIS — Z950 Presence of cardiac pacemaker: Secondary | ICD-10-CM | POA: Insufficient documentation

## 2013-03-27 DIAGNOSIS — I442 Atrioventricular block, complete: Secondary | ICD-10-CM

## 2013-03-27 DIAGNOSIS — IMO0001 Reserved for inherently not codable concepts without codable children: Secondary | ICD-10-CM

## 2013-03-27 NOTE — Telephone Encounter (Signed)
Nadine Counts at [facility] calling to get NPI # for patient's 82-month followup of [employer] pacer.  NPI # given.  Gaylene Brooks, RN

## 2013-03-28 ENCOUNTER — Encounter: Payer: Self-pay | Admitting: Cardiovascular Disease

## 2013-03-29 ENCOUNTER — Encounter: Payer: Self-pay | Admitting: Cardiovascular Disease

## 2013-03-29 ENCOUNTER — Ambulatory Visit (INDEPENDENT_AMBULATORY_CARE_PROVIDER_SITE_OTHER): Payer: Medicare Other | Admitting: Cardiovascular Disease

## 2013-03-29 VITALS — BP 132/80 | HR 81 | Resp 16 | Ht 62.0 in | Wt 120.1 lb

## 2013-03-29 DIAGNOSIS — Z950 Presence of cardiac pacemaker: Secondary | ICD-10-CM

## 2013-03-29 DIAGNOSIS — I442 Atrioventricular block, complete: Secondary | ICD-10-CM

## 2013-03-29 DIAGNOSIS — E785 Hyperlipidemia, unspecified: Secondary | ICD-10-CM

## 2013-03-29 DIAGNOSIS — E119 Type 2 diabetes mellitus without complications: Secondary | ICD-10-CM

## 2013-03-29 LAB — PACEMAKER DEVICE OBSERVATION
AL AMPLITUDE: 2 mv
ATRIAL PACING PM: 71.6
BAMS-0001: 165 {beats}/min
BATTERY VOLTAGE: 2.76 V
VENTRICULAR PACING PM: 99.9

## 2013-03-29 NOTE — Patient Instructions (Addendum)
Your physician recommends that you schedule a follow-up appointment in: 6 months  

## 2013-03-29 NOTE — Progress Notes (Signed)
In office pacemaker interrogation. Normal device function. No changes made this session. 

## 2013-04-04 ENCOUNTER — Other Ambulatory Visit (HOSPITAL_COMMUNITY): Payer: Self-pay | Admitting: Gastroenterology

## 2013-04-04 DIAGNOSIS — R110 Nausea: Secondary | ICD-10-CM

## 2013-04-08 ENCOUNTER — Encounter: Payer: Self-pay | Admitting: Cardiovascular Disease

## 2013-04-08 NOTE — Assessment & Plan Note (Signed)
One would anticipate better diabetes control with her significant weight loss. Her last hemoglobin A1c was 8%, which was still suboptimal. May have improved further since then

## 2013-04-08 NOTE — Progress Notes (Signed)
Patient ID: Mercedes Barr, female   DOB: November 20, 1945, 67 y.o.   MRN: [id number]      Reason for office visit Complete heart block, pacemaker followup, history of AV cushion defect status post surgical repair  This is Mercedes Barr's first followup visit since Mercedes Barr's retirement. He took care of her for many years. She had surgical repair of  atrioventricular cushion defect repair, in 1988 when she was 67 years old. Surgery was complicated by development of complete heart block and she received a pacemaker (her initial device I think was an abdominal generator with epicardial leads). A  right subclavian pacemaker was placed in 1991, but had to be removed because of complaints of pain. She had a complete revision of the system in 1999 and her current left subclavian atrial and ventricular leads date to that time. Her most recent generator change out occurred in 2008. The atrial lead is a Medtronic [id number]. The ventricular lead is Medtronic [id number]. The current generator is a Medtronic Adapta.  She has never had problems with coronary disease. Her most recent cardiac catheterization in 2011 confirmed that the coronaries are free of stenosis. At that time she also had a right heart catheterization that showed remarkably normal hemodynamic readings. He does not have any residual pulmonary hypertension.  She is very sensitive to any adjustments to pacer settings. Even application of the magnet for remote battery testing has been associated with syncope. For this reason ventricular pacing thresholds are not being tested. She does not have any detectable ventricular escape rhythm. She paces the atrium roughly 72% of the time in the ventricle 100% of the time. There have been no episodes of meaningful notes which. Heart rate histogram distribution is favorable the current sensor settings. Battery longevity is estimated to be around 3-1/2 years.  After undergoing right total knee replacement last year she  developed persistent anorexia and nausea. She has lost 21 pounds since her last appointment. She is now borderline underweight at 120 pounds, BMI 21-22. She still requires pharmacological therapy for diabetes mellitus.    Allergies  Allergen Reactions  . Chlorhexidine Itching and Rash  . Amitriptyline Hcl     REACTION: itching/rash  . Benazepril Hcl     REACTION: rash  . Naproxen     REACTION: swelling, itching  . Propoxyphene-Acetaminophen     REACTION: nausea  . Statins     REACTION: leg pains PT IS ABLE TO TOLERATE CRESTOR    Current Outpatient Prescriptions  Medication Sig Dispense Refill  . albuterol (PROVENTIL HFA;VENTOLIN HFA) 108 (90 BASE) MCG/ACT inhaler Inhale 2 puffs into the lungs every 4 (four) hours as needed for wheezing.  2 Inhaler  2  . albuterol (PROVENTIL) (2.5 MG/3ML) 0.083% nebulizer solution Take 2.5 mg by nebulization every 4 (four) hours as needed for wheezing. As Needed.      Marland Kitchen aspirin 81 MG tablet Take 81 mg by mouth daily.       . citalopram (CELEXA) 20 MG tablet Take 1 tablet (20 mg total) by mouth every morning.  30 tablet  1  . EPINEPHrine (EPI-PEN) 0.3 mg/0.3 mL DEVI Inject 0.3 mg into the muscle once. For severe allergic reaction to bee sting      . fluticasone (FLOVENT HFA) 110 MCG/ACT inhaler Inhale 1 puff into the lungs 2 (two) times daily.      . furosemide (LASIX) 20 MG tablet Take 20 mg by mouth every morning.      . glyBURIDE (DIABETA)  5 MG tablet Take 10 mg by mouth 2 (two) times daily with a meal.      . glycerin adult (GLYCERIN ADULT) 2 G SUPP Place 1 suppository rectally daily as needed. For constipation  10 each  0  . hydrochlorothiazide (MICROZIDE) 12.5 MG capsule Take 12.5 mg by mouth every morning.      Marland Kitchen levothyroxine (SYNTHROID, LEVOTHROID) 75 MCG tablet Take 75 mcg by mouth daily before breakfast.      . losartan (COZAAR) 50 MG tablet TAKE 1 TABLET (50MG ) BY MOUTH DAILY  90 tablet  3  . metFORMIN (GLUCOPHAGE) 1000 MG tablet Take  1,000 mg by mouth 2 (two) times daily with a meal.      . methocarbamol (ROBAXIN) 500 MG tablet Take 1 tablet (500 mg total) by mouth every 6 (six) hours as needed.  80 tablet  1  . metoCLOPramide (REGLAN) 5 MG tablet Take 1 tablet (5 mg total) by mouth 4 (four) times daily.  120 tablet  3  . omeprazole (PRILOSEC) 20 MG capsule Take 20 mg by mouth 2 (two) times daily.       . ondansetron (ZOFRAN) 4 MG tablet Take 1 tablet (4 mg total) by mouth every 8 (eight) hours as needed for nausea.  20 tablet  1  . polyethylene glycol powder (GLYCOLAX/MIRALAX) powder Take 17 g by mouth 2 (two) times daily.  3350 g  1  . potassium chloride (KLOR-CON) 20 MEQ packet Take 20 mEq by mouth daily.      . promethazine (PHENERGAN) 12.5 MG tablet Take 1 tablet (12.5 mg total) by mouth every 8 (eight) hours as needed for nausea.  20 tablet  0  . propranolol (INDERAL) 40 MG tablet TAKE 1 TABLET (40MG ) TWICE A DAY (NEED APPT FOR FURTHER REFILLS)  60 tablet  10  . sitaGLIPtin (JANUVIA) 100 MG tablet Take 100 mg by mouth every morning.      . traMADol (ULTRAM) 50 MG tablet Take 1-2 tablets (50-100 mg total) by mouth every 6 (six) hours as needed.  80 tablet  1  . Travoprost, BAK Free, (TRAVATAN) 0.004 % SOLN ophthalmic solution Place 1 drop into both eyes at bedtime.      . triamcinolone cream (KENALOG) 0.5 % Apply 1 application topically 2 (two) times daily as needed (itching). Apply to itchy areas two times a day until 2-3 days after healed.  30 g  1  . zolpidem (AMBIEN) 5 MG tablet Take 5 mg by mouth at bedtime as needed for sleep.      . [DISCONTINUED] albuterol (PROVENTIL,VENTOLIN) 90 MCG/ACT inhaler Inhale 2 puffs into the lungs every 4 (four) hours as needed for wheezing or shortness of breath.  17 g  5   No current facility-administered medications for this visit.    Past Medical History  Diagnosis Date  . Diabetes mellitus   . Hypertension   . Hypothyroidism   . COPD (chronic obstructive pulmonary disease)     . Heart murmur   . GERD (gastroesophageal reflux disease)   . 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 Steffen Hase - [facility].  OFFICE NOTE DR. A. Barr STATES  "EXTREMELY PACEMAKER SENSITIVE AND WHEN YOU TRY TO CHECK FOR UNDERLYING RHYTHMS SHE WILL HAVE SNYCOPE AND WE HAVE NOT DONE THIS NOW IN ABOUT 2 YEARS".  . Normal coronary arteries 2011  . Complete heart block     Past Surgical History  Procedure Laterality Date  . Pacemaker insertion  1988    last gen 11/08- MDT  . Tubal ligation    . Cardiac catheterization  2011     SHOWED NO CAD-PER CARDIOLOGY OFFICE NOTES DR. A. Barr  . Atrioventricular cushion defect repair  1988  . Total knee arthroplasty Right 11/19/2012    Procedure: RIGHT TOTAL KNEE ARTHROPLASTY;  Surgeon: Loanne Drilling, MD;  Location: [facility];  Service: Orthopedics;  Laterality: Right;    Family History  Problem Relation Age of Onset  . Heart failure Mother   . Heart failure Sister     History   Social History  . Marital Status: Divorced    Spouse Name: N/A    Number of Children: N/A  . Years of Education: N/A   Occupational History  . Not on file.   Social History Main Topics  . Smoking status: Never Smoker   . Smokeless tobacco: Never Used  . Alcohol Use: No  . Drug Use: No  . Sexual Activity: Not on file   Other Topics Concern  . Not on file   Social History Narrative  . No narrative on file    Review of systems: The patient specifically denies any chest pain at rest or with exertion, dyspnea at rest or with exertion, orthopnea, paroxysmal nocturnal dyspnea, syncope, palpitations, focal neurological deficits, intermittent claudication, lower extremity edema, unexplained weight gain, cough, hemoptysis or wheezing.  The patient also denies abdominal pain,  dysphagia, diarrhea, constipation, polyuria, polydipsia, dysuria, hematuria, frequency, urgency, abnormal bleeding or bruising, fever,  chills, unexpected weight changes, mood swings, change in skin or hair texture, change in voice quality, auditory or visual problems, allergic reactions or rashes, new musculoskeletal complaints other than usual "aches and pains".   PHYSICAL EXAM BP 132/80  Pulse 81  Resp 16  Ht 5\' 2"  (1.575 m)  Wt 120 lb 1.6 oz (54.477 kg)  BMI 21.96 kg/m2  General: Alert, oriented x3, no distress Head: no evidence of trauma, PERRL, EOMI, no exophtalmos or lid lag, no myxedema, no xanthelasma; normal ears, nose and oropharynx Neck: normal jugular venous pulsations and no hepatojugular reflux; brisk carotid pulses without delay and no carotid bruits Chest: clear to auscultation, no signs of consolidation by percussion or palpation, normal fremitus, symmetrical and full respiratory excursions; scar in the left subclavian area where the pacemaker scar located looks healthy; also has a scar of previous right subclavian pacemaker Cardiovascular: normal position and quality of the apical impulse, regular rhythm, normal first and second heart sounds, no murmurs, rubs or gallops Abdomen: no tenderness or distention, no masses by palpation, no abnormal pulsatility or arterial bruits, normal bowel sounds, no hepatosplenomegaly Extremities: no clubbing, cyanosis or edema; 2+ radial, ulnar and brachial pulses bilaterally; 2+ right femoral, posterior tibial and dorsalis pedis pulses; 2+ left femoral, posterior tibial and dorsalis pedis pulses; no subclavian or femoral bruits Neurological: grossly nonfocal   EKG: AV sequential paced  Lipid Panel     Component Value Date/Time   CHOL 120 10/03/2012 1002   TRIG 168* 10/03/2012 1002   HDL 36* 10/03/2012 1002   CHOLHDL 3.3 10/03/2012 1002   VLDL 34 10/03/2012 1002   LDLCALC 50 10/03/2012 1002    BMET    Component Value Date/Time   NA 141 12/21/2012 1029   K 4.2 12/21/2012 1029   CL 99 12/21/2012 1029   CO2 32 12/21/2012 1029   GLUCOSE 215* 12/21/2012 1029   BUN 13  12/21/2012 1029  CREATININE 0.68 12/21/2012 1029   CREATININE 0.59 11/23/2012 0405   CALCIUM 9.8 12/21/2012 1029   GFRNONAA >90 11/23/2012 0405   GFRAA >90 11/23/2012 0405     ASSESSMENT AND PLAN Pacemaker- last gen change was a MDT Nov 2008 Please see device check notes same date. Repeated previous checks show absence of any escape rhythm; she has had recurrent syncope with ventricular pacing threshold testing and we are not doing that at this. Otherwise normal device  Complete heart block Pacemaker dependent  DM (diabetes mellitus), type 2 One would anticipate better diabetes control with her significant weight loss. Her last hemoglobin A1c was 8%, which was still suboptimal. May have improved further since then  HYPERLIPIDEMIA NEC/NOS Likewise, one would expect continued improvement of triglyceride and HDL levels with weight loss   Orders Placed This Encounter  Procedures  . Pacemaker Device Observation   No orders of the defined types were placed in this encounter.    Junious Silk, MD, [facility] [phone] office [phone] pager

## 2013-04-08 NOTE — Assessment & Plan Note (Signed)
Pacemaker dependent 

## 2013-04-08 NOTE — Assessment & Plan Note (Signed)
Likewise, one would expect continued improvement of triglyceride and HDL levels with weight loss

## 2013-04-08 NOTE — Assessment & Plan Note (Signed)
Please see device check notes same date. Repeated previous checks show absence of any escape rhythm; she has had recurrent syncope with ventricular pacing threshold testing and we are not doing that at this. Otherwise normal device

## 2013-04-09 ENCOUNTER — Encounter: Payer: Self-pay | Admitting: Family Medicine

## 2013-04-15 ENCOUNTER — Encounter: Payer: Self-pay | Admitting: Family Medicine

## 2013-04-18 ENCOUNTER — Encounter (HOSPITAL_COMMUNITY)
Admission: RE | Admit: 2013-04-18 | Discharge: 2013-04-18 | Disposition: A | Discharge status: Home / Self Care | Payer: Medicare Other | Source: Ambulatory Visit | Attending: Gastroenterology | Admitting: Gastroenterology

## 2013-04-18 DIAGNOSIS — I10 Essential (primary) hypertension: Secondary | ICD-10-CM | POA: Insufficient documentation

## 2013-04-18 DIAGNOSIS — R110 Nausea: Secondary | ICD-10-CM | POA: Insufficient documentation

## 2013-04-18 DIAGNOSIS — K3189 Other diseases of stomach and duodenum: Secondary | ICD-10-CM | POA: Insufficient documentation

## 2013-04-18 DIAGNOSIS — J4489 Other specified chronic obstructive pulmonary disease: Secondary | ICD-10-CM | POA: Insufficient documentation

## 2013-04-18 DIAGNOSIS — E119 Type 2 diabetes mellitus without complications: Secondary | ICD-10-CM | POA: Insufficient documentation

## 2013-04-18 DIAGNOSIS — R142 Eructation: Secondary | ICD-10-CM | POA: Insufficient documentation

## 2013-04-18 DIAGNOSIS — J449 Chronic obstructive pulmonary disease, unspecified: Secondary | ICD-10-CM | POA: Insufficient documentation

## 2013-04-18 DIAGNOSIS — R141 Gas pain: Secondary | ICD-10-CM | POA: Insufficient documentation

## 2013-04-18 DIAGNOSIS — R6881 Early satiety: Secondary | ICD-10-CM | POA: Insufficient documentation

## 2013-04-18 DIAGNOSIS — K219 Gastro-esophageal reflux disease without esophagitis: Secondary | ICD-10-CM | POA: Insufficient documentation

## 2013-04-18 MED ORDER — TECHNETIUM TC 99M SULFUR COLLOID
2.2000 | Freq: Once | INTRAVENOUS | Status: AC | PRN
  Administered 2013-04-18: 2.2 via INTRAVENOUS

## 2013-04-23 ENCOUNTER — Ambulatory Visit: Payer: Medicare Other | Admitting: Family Medicine

## 2013-05-02 ENCOUNTER — Other Ambulatory Visit: Payer: Self-pay | Admitting: Family Medicine

## 2013-05-03 NOTE — Telephone Encounter (Signed)
Rejecting klor-con because cannot find indication for it in this patient. Please let me know if she is taking daily and I will rx. Leona Singleton, MD

## 2013-05-07 ENCOUNTER — Other Ambulatory Visit: Payer: Self-pay | Admitting: Family Medicine

## 2013-05-07 NOTE — Telephone Encounter (Signed)
Called and left voicemail for pt to call back. When she does, please let her know I am refilling Kdur but she needs re-evaluation for need for potassium. Ask her if she is still taking daily and for what indication. Thx.

## 2013-05-29 ENCOUNTER — Telehealth: Payer: Self-pay | Admitting: Family Medicine

## 2013-05-29 ENCOUNTER — Other Ambulatory Visit: Payer: Self-pay | Admitting: Family Medicine

## 2013-05-29 ENCOUNTER — Other Ambulatory Visit: Payer: Self-pay

## 2013-05-29 DIAGNOSIS — Z1231 Encounter for screening mammogram for malignant neoplasm of breast: Secondary | ICD-10-CM

## 2013-05-29 NOTE — Telephone Encounter (Signed)
Left message on identified VM that appt was made 06/17/13 @ 3:50pm.  Mailing reminder card for pt. Jazmin Hartsell,CMA

## 2013-05-29 NOTE — Telephone Encounter (Signed)
Pt called and would like to have a mammogram done. She only has a ride on 06/17/13 after 1 pm. She would like [place] to schedule this for her. JW

## 2013-06-17 ENCOUNTER — Ambulatory Visit: Payer: Medicare Other

## 2013-06-26 ENCOUNTER — Encounter: Payer: Self-pay | Admitting: Family Medicine

## 2013-06-26 DIAGNOSIS — Z0000 Encounter for general adult medical examination without abnormal findings: Secondary | ICD-10-CM | POA: Insufficient documentation

## 2013-07-11 ENCOUNTER — Telehealth: Payer: Self-pay | Admitting: Family Medicine

## 2013-07-11 NOTE — Telephone Encounter (Signed)
Received confirmation of order from [facility] pharmacy requesting nebulizer for ipratropium / albuterol. I do not see this listed as one of pt's active medications. Please help me find out if she is taking this and who prescribed it, because I cannot fill this out with the information I have. Thanks. Leona Singleton, MD

## 2013-07-12 NOTE — Telephone Encounter (Signed)
Spoke with pt and she does still use this medication.  It was last written by Dr. Lula Olszewski and only uses it this time of year with her asthma.  Jazmin Hartsell,CMA

## 2013-07-15 NOTE — Telephone Encounter (Signed)
Refilled and faxed. Pt should be seen for follow up and re-evaluation.  Leona Singleton, MD

## 2013-07-15 NOTE — Telephone Encounter (Signed)
Pt informed and will call back to schedule. Mercedes Barr, Maryjo Rochester

## 2013-07-24 ENCOUNTER — Telehealth: Payer: Self-pay | Admitting: Family Medicine

## 2013-07-24 NOTE — Telephone Encounter (Signed)
Pt  Called [facility]. They state we have never responded about pt's meds. Rosann Auerbach says if we call them [phone] option 3 and go over every med one by one, they can put it in the system

## 2013-07-29 NOTE — Telephone Encounter (Signed)
Pt called and wanted to know if Dr. Benjamin Stain sent or called [employer] about her medications.? JW

## 2013-07-30 NOTE — Telephone Encounter (Signed)
Can you resend pt's medications to [facility]?  Jazmin Hartsell,CMA

## 2013-08-05 ENCOUNTER — Telehealth: Payer: Self-pay | Admitting: Family Medicine

## 2013-08-05 NOTE — Telephone Encounter (Signed)
Pt would like Dr. Benjamin Stain to send a refill of her fluid pills to a local pharmacy? Rosann Auerbach is having issues with getting her medications. They said that we are not responding to their request for refills. Can someone call [facility] and talk about this. JW

## 2013-08-06 MED ORDER — ALBUTEROL SULFATE HFA 108 (90 BASE) MCG/ACT IN AERS: 2.0000 | INHALATION_SPRAY | RESPIRATORY_TRACT | Status: DC | PRN

## 2013-08-06 MED ORDER — ALBUTEROL SULFATE (2.5 MG/3ML) 0.083% IN NEBU: 2.5000 mg | INHALATION_SOLUTION | RESPIRATORY_TRACT | Status: DC | PRN

## 2013-08-06 MED ORDER — PROPRANOLOL HCL 40 MG PO TABS: 40.0000 mg | ORAL_TABLET | Freq: Two times a day (BID) | ORAL | Status: DC

## 2013-08-06 MED ORDER — SITAGLIPTIN PHOSPHATE 100 MG PO TABS: 100.0000 mg | ORAL_TABLET | Freq: Every morning | ORAL | Status: DC

## 2013-08-06 MED ORDER — LEVOTHYROXINE SODIUM 75 MCG PO TABS: 75.0000 ug | ORAL_TABLET | Freq: Every day | ORAL | Status: DC

## 2013-08-06 MED ORDER — METFORMIN HCL 1000 MG PO TABS: 1000.0000 mg | ORAL_TABLET | Freq: Two times a day (BID) | ORAL | Status: DC

## 2013-08-06 MED ORDER — CITALOPRAM HYDROBROMIDE 20 MG PO TABS: 20.0000 mg | ORAL_TABLET | Freq: Every morning | ORAL | Status: DC

## 2013-08-06 MED ORDER — POLYETHYLENE GLYCOL 3350 17 GM/SCOOP PO POWD: 17.0000 g | Freq: Two times a day (BID) | ORAL | Status: DC

## 2013-08-06 MED ORDER — ASPIRIN 81 MG PO TABS: 81.0000 mg | ORAL_TABLET | Freq: Every day | ORAL | Status: DC

## 2013-08-06 MED ORDER — ONDANSETRON HCL 4 MG PO TABS: 4.0000 mg | ORAL_TABLET | Freq: Three times a day (TID) | ORAL | Status: DC | PRN

## 2013-08-06 MED ORDER — FUROSEMIDE 20 MG PO TABS: 20.0000 mg | ORAL_TABLET | Freq: Every morning | ORAL | Status: DC

## 2013-08-06 MED ORDER — LOSARTAN POTASSIUM 50 MG PO TABS: 50.0000 mg | ORAL_TABLET | Freq: Every day | ORAL | Status: DC

## 2013-08-06 MED ORDER — OMEPRAZOLE 20 MG PO CPDR: 20.0000 mg | DELAYED_RELEASE_CAPSULE | Freq: Two times a day (BID) | ORAL | Status: DC

## 2013-08-06 MED ORDER — TRAVOPROST (BAK FREE) 0.004 % OP SOLN: 1.0000 [drp] | Freq: Every day | OPHTHALMIC | Status: DC

## 2013-08-06 MED ORDER — FLUTICASONE PROPIONATE HFA 110 MCG/ACT IN AERO: 1.0000 | INHALATION_SPRAY | Freq: Two times a day (BID) | RESPIRATORY_TRACT | Status: DC

## 2013-08-06 MED ORDER — POTASSIUM CHLORIDE 20 MEQ PO PACK: 20.0000 meq | PACK | Freq: Every day | ORAL | Status: DC

## 2013-08-06 MED ORDER — GLYBURIDE 5 MG PO TABS: 10.0000 mg | ORAL_TABLET | Freq: Two times a day (BID) | ORAL | Status: DC

## 2013-08-06 MED ORDER — HYDROCHLOROTHIAZIDE 12.5 MG PO CAPS: 12.5000 mg | ORAL_CAPSULE | Freq: Every morning | ORAL | Status: DC

## 2013-08-06 MED ORDER — GLYCERIN (LAXATIVE) 2 G RE SUPP: 1.0000 | Freq: Every day | RECTAL | Status: DC | PRN

## 2013-08-06 NOTE — Telephone Encounter (Signed)
Please call to let patient know I have sent prescriptions electronically to [facility] for 1 month.   Please also let patient know that for patient safety I really need her to come in with all of her medications that she takes regularly so I can be sure we are prescribing the right things and not leaving anything important out. She had stated she plans to do this but I see no appointment scheduled for her.   I will not refill triamcinolone cream, epi-pen, phenergan, reglan, ambien, robaxin, or tramadol without an office visit.  Leona Singleton, MD

## 2013-08-06 NOTE — Telephone Encounter (Signed)
Also, needs refills on albuterol medication.

## 2013-08-07 NOTE — Telephone Encounter (Signed)
Attempted to call no answer no machine.  Please inform that multiple medications were sent to the pharmacy, but she MUST make an appt and bring ALL her current medications in order to have other refills or additional one.  Please have pt schedule appt. Jeff Mccallum, Maryjo [place]

## 2013-08-07 NOTE — Telephone Encounter (Signed)
Let her know I have sent the refills (including albuterol and fluid pill) to the [facility] pharmacy. Ask if she still wants [place] to send to the local pharmacy (and she can call [employer] and have them transfer rx there so I am not writing new rx/duplicating rx).  ThxLeona Singleton, MD

## 2013-08-16 ENCOUNTER — Telehealth: Payer: Self-pay | Admitting: Family Medicine

## 2013-08-16 NOTE — Telephone Encounter (Signed)
Mercedes Barr wants Dr T to Call [phone] option 3. This is about pts potassium med.  [employer] sent letter to pt stating med couldn't be refilled until dr called and authorized it

## 2013-08-19 NOTE — Telephone Encounter (Signed)
Patient still needing potassium refill

## 2013-08-26 MED ORDER — POTASSIUM CHLORIDE CRYS ER 20 MEQ PO TBCR: 20.0000 meq | EXTENDED_RELEASE_TABLET | Freq: Every day | ORAL | Status: DC

## 2013-08-26 NOTE — Telephone Encounter (Signed)
Please call patient to let her know that I refilled potassium for 1 month. I will need her to come in before I do more refills.  I do not know why she is on potassium and her level has not been checked in some time. Her last creatinine was normal (12/2012) and I assume she is on K because she is on lasix, but I do not know if she needs to indefinitely continue this.  - Will refill K x 1 month. - Pt needs to come in. - When she comes in, we will check a BMET.  Hilton Sinclair, MD

## 2013-08-26 NOTE — Telephone Encounter (Signed)
Not sure if you saw this, was unsure if your pt should be on this long term.  Thanks, Gaspar Bidding

## 2013-08-27 NOTE — Telephone Encounter (Signed)
Appt 08/28/13. Mercedes Barr, Mercedes Barr

## 2013-08-28 ENCOUNTER — Ambulatory Visit: Payer: Medicare Other | Admitting: Family Medicine

## 2013-09-09 ENCOUNTER — Telehealth: Payer: Self-pay | Admitting: Family Medicine

## 2013-09-09 MED ORDER — LOSARTAN POTASSIUM 50 MG PO TABS: 50.0000 mg | ORAL_TABLET | Freq: Every day | ORAL | Status: DC

## 2013-09-09 MED ORDER — OMEPRAZOLE 20 MG PO CPDR: 20.0000 mg | DELAYED_RELEASE_CAPSULE | Freq: Two times a day (BID) | ORAL | Status: DC

## 2013-09-09 MED ORDER — HYDROCHLOROTHIAZIDE 12.5 MG PO CAPS: 12.5000 mg | ORAL_CAPSULE | Freq: Every morning | ORAL | Status: DC

## 2013-09-09 MED ORDER — PROPRANOLOL HCL 40 MG PO TABS: 40.0000 mg | ORAL_TABLET | Freq: Two times a day (BID) | ORAL | Status: DC

## 2013-09-09 MED ORDER — FUROSEMIDE 20 MG PO TABS: 20.0000 mg | ORAL_TABLET | Freq: Every morning | ORAL | Status: DC

## 2013-09-09 MED ORDER — METFORMIN HCL 1000 MG PO TABS: 1000.0000 mg | ORAL_TABLET | Freq: Two times a day (BID) | ORAL | Status: DC

## 2013-09-09 MED ORDER — SITAGLIPTIN PHOSPHATE 100 MG PO TABS: 100.0000 mg | ORAL_TABLET | Freq: Every morning | ORAL | Status: DC

## 2013-09-09 MED ORDER — GLYBURIDE 5 MG PO TABS: 10.0000 mg | ORAL_TABLET | Freq: Two times a day (BID) | ORAL | Status: DC

## 2013-09-09 NOTE — Telephone Encounter (Signed)
Pt needs refills on the following" Lasix  Omeprazole tropranolol januvia gliperide hydrochlorarthiazide losartam metaformin

## 2013-09-09 NOTE — Telephone Encounter (Signed)
LMOVM for pt to return call.  Please inform of the below. Vicke Plotner Dawn  

## 2013-09-09 NOTE — Telephone Encounter (Signed)
Prescribed 1 month. However, please let patient know that she really needs to come in so we can check up on her medical issues necessitating these medications. Regular checkup for HTN and DM is very important to management.  Hilton Sinclair, MD

## 2013-09-13 ENCOUNTER — Telehealth: Payer: Self-pay | Admitting: Family Medicine

## 2013-09-13 NOTE — Telephone Encounter (Signed)
In reference to phone note dated today:  Blue [employer]: I am assuming the prescriptions she is having trouble with are these that I filled on 2/2 through [facility]. Could you call them and find out what they need? Is it just for an attending to prescribe the meds? We have not had to do that before.  Hilton Sinclair, MD

## 2013-09-13 NOTE — Telephone Encounter (Signed)
Which prescriptions specifically? My signature should suffice - could she also clarify the pharmacy so we can call and look into this?   Hilton Sinclair, MD

## 2013-09-13 NOTE — Telephone Encounter (Signed)
Pt called because the online pharmacy will not fill her prescriptions because Dr. Darene Lamer, is not registered they need a faculty member to also sign the prescription so that they can fill it. jw

## 2013-09-16 NOTE — Telephone Encounter (Signed)
Reference #[id number].  These medications have already been filled and sent out.  There was no problem with signature.  Pt is aware of this and states that she received her medication already. Avanna Sowder,CMA

## 2013-09-20 NOTE — Telephone Encounter (Signed)
Have we gotten this resolved?

## 2013-09-20 NOTE — Telephone Encounter (Signed)
Yes.  It looks like it was documented in a different note pertaining to the same problem. Jazmin Hartsell,CMA

## 2013-09-27 ENCOUNTER — Telehealth: Payer: Self-pay | Admitting: Family Medicine

## 2013-09-27 NOTE — Telephone Encounter (Signed)
Patient calls back. Insurance company states that glyburide could possibly still be covered with Prior Authorization. Patient was told that Dr. Darene Lamer will need to call the Coverage Determination Dept at [phone] for more info. FYI

## 2013-09-27 NOTE — Telephone Encounter (Signed)
Called patient regarding message I received from [employer] stating patient's glyburide 5mg  tablets would not be covered. Patient reports getting the same letter and states that she just received a temporary supply from them that will last her just under a month and she has an appt with me 3/16. I asked her to call pharmacy to ask what will be on formulary. She will do this and have them fax me this information. She will also have them tell it to her incase I do not get the fax. Once I get this, I can send the new rx in.  Hilton Sinclair, MD

## 2013-09-30 ENCOUNTER — Telehealth: Payer: Self-pay | Admitting: Family Medicine

## 2013-09-30 NOTE — Telephone Encounter (Signed)
Says she has been calling [employer] for several months to get her Strips. At the last call, she was told she needed pre-authorization. She called her and got the infor and called [employer]. She thought this had been handled. Please advise  She has been out of her strips about 4 months

## 2013-10-04 NOTE — Telephone Encounter (Signed)
Mercedes Barr, how do I get prior authorization for her from [employer] for her strips? Thanks. Hilton Sinclair, MD

## 2013-10-07 NOTE — Telephone Encounter (Signed)
Called patient for additional info about test strips.  Left message to call our office back.  Burna Forts, BSN, RN-BC

## 2013-10-09 NOTE — Telephone Encounter (Signed)
Received call from pt stating she is out of test strips.  Pt stated she should be checking her blood sugar twice a day. Name of test strips and meter is Pro[employer].  Pt said she has been using a family members strips to check blood sugar.  I reviewed pt med list, I did not see any test strips or meter listed.  Derl Barrow, RN

## 2013-10-10 MED ORDER — GLUCOSE BLOOD VI STRP: ORAL_STRIP | Status: DC

## 2013-10-10 NOTE — Telephone Encounter (Signed)
OK I wrote rx for strips and sent to her pharmacy. But the issue of [employer] telling her she needs prior-auth still stands. Jeannette/Tamika, can you help with this - what else do I need to do? If it is just a prior auth form, can you get this started for me and put it in my box and I will complete? Hilton Sinclair, MD

## 2013-10-11 ENCOUNTER — Encounter: Payer: Self-pay | Admitting: Cardiovascular Disease

## 2013-10-11 ENCOUNTER — Ambulatory Visit: Payer: Medicare Other | Admitting: Family Medicine

## 2013-10-11 ENCOUNTER — Ambulatory Visit (INDEPENDENT_AMBULATORY_CARE_PROVIDER_SITE_OTHER): Payer: Medicare Other | Admitting: Cardiovascular Disease

## 2013-10-11 VITALS — BP 112/60 | HR 83 | Resp 16 | Ht 62.0 in | Wt 136.0 lb

## 2013-10-11 DIAGNOSIS — I442 Atrioventricular block, complete: Secondary | ICD-10-CM

## 2013-10-11 LAB — MDC_IDC_ENUM_SESS_TYPE_INCLINIC
Battery Impedance: 1260 Ohm
Battery Remaining Longevity: 36 mo
Brady Statistic AS VP Percent: 12 %
Brady Statistic AS VS Percent: 0 %
Lead Channel Impedance Value: 635 Ohm
Lead Channel Pacing Threshold Amplitude: 0.75 V
Lead Channel Pacing Threshold Amplitude: 1 V
Lead Channel Setting Pacing Amplitude: 2 V
Lead Channel Setting Pacing Pulse Width: 0.4 ms
Lead Channel Setting Sensing Sensitivity: 2.8 mV
MDC IDC MSMT BATTERY VOLTAGE: 2.76 V
MDC IDC MSMT LEADCHNL RA IMPEDANCE VALUE: 638 Ohm
MDC IDC MSMT LEADCHNL RA PACING THRESHOLD PULSEWIDTH: 0.4 ms
MDC IDC MSMT LEADCHNL RA SENSING INTR AMPL: 2 mV
MDC IDC MSMT LEADCHNL RV PACING THRESHOLD PULSEWIDTH: 0.4 ms
MDC IDC SESS DTM: 20150306145804
MDC IDC SET LEADCHNL RV PACING AMPLITUDE: 2.5 V
MDC IDC STAT BRADY AP VP PERCENT: 88 %
MDC IDC STAT BRADY AP VS PERCENT: 0 %

## 2013-10-11 LAB — PACEMAKER DEVICE OBSERVATION

## 2013-10-11 NOTE — Patient Instructions (Signed)
Your physician recommends that you schedule a follow-up appointment in: 6 months with Dr.Croitoru    

## 2013-10-13 ENCOUNTER — Encounter: Payer: Self-pay | Admitting: Cardiovascular Disease

## 2013-10-13 NOTE — Progress Notes (Signed)
Patient ID: Mercedes Barr, female   DOB: 05-16-1946, 68 y.o.   MRN: [id number]     Reason for office visit Pacemaker followup, history of congenital heart disease, complete heart block  She had surgical repair of atrioventricular cushion defect repair, in 1988 when she was 68 years old. Surgery was complicated by development of complete heart block and she received a pacemaker (her initial device I think was an abdominal generator with epicardial leads). A right subclavian pacemaker was placed in 1991, but had to be removed because of complaints of pain. She had a complete revision of the system in 1999 and her current left subclavian atrial and ventricular leads date to that time. Her most recent generator change out occurred in 2008. The atrial lead is a Medtronic [id number]. The ventricular lead is Medtronic [id number]. The current generator is a Medtronic Adapta.  Since her last appointment she has not had any cardiac symptoms and specifically denies syncope, dizziness, palpitations, chest pain or any change in her chronic pattern of shortness of breath which is attributable to reactive airway disease.  Pacemaker function today is normal. Estimated longevity of approximately 3 years. 100% ventricular pacing and roughly 70% atrial pacing, comparable to historical trends. Today - atrioventricular sequential pacing. No episodes of ventricular tachycardia or other arrhythmias have been recorded.    Allergies  Allergen Reactions  . Chlorhexidine Itching and Rash  . Amitriptyline Hcl     REACTION: itching/rash  . Benazepril Hcl     REACTION: rash  . Naproxen     REACTION: swelling, itching  . Propoxyphene N-Acetaminophen     REACTION: nausea  . Statins     REACTION: leg pains PT IS ABLE TO TOLERATE CRESTOR    Current Outpatient Prescriptions  Medication Sig Dispense Refill  . albuterol (PROVENTIL HFA;VENTOLIN HFA) 108 (90 BASE) MCG/ACT inhaler Inhale 2 puffs into the lungs every 4 (four) hours as  needed for wheezing.  1 Inhaler  0  . albuterol (PROVENTIL) (2.5 MG/3ML) 0.083% nebulizer solution Take 3 mLs (2.5 mg total) by nebulization every 4 (four) hours as needed for wheezing. As Needed.  75 mL  0  . aspirin 81 MG tablet Take 1 tablet (81 mg total) by mouth daily.  30 tablet  0  . citalopram (CELEXA) 20 MG tablet Take 1 tablet (20 mg total) by mouth every morning.  30 tablet  0  . EPINEPHrine (EPI-PEN) 0.3 mg/0.3 mL DEVI Inject 0.3 mg into the muscle once. For severe allergic reaction to bee sting      . fluticasone (FLOVENT HFA) 110 MCG/ACT inhaler Inhale 1 puff into the lungs 2 (two) times daily.  1 Inhaler  0  . furosemide (LASIX) 20 MG tablet Take 1 tablet (20 mg total) by mouth every morning.  30 tablet  0  . glucose blood test strip Use as instructed  60 each  11  . glyBURIDE (DIABETA) 5 MG tablet Take 2 tablets (10 mg total) by mouth 2 (two) times daily with a meal.  120 tablet  0  . glycerin adult (GLYCERIN ADULT) 2 G SUPP Place 1 suppository rectally daily as needed. For constipation  10 each  0  . hydrochlorothiazide (MICROZIDE) 12.5 MG capsule Take 1 capsule (12.5 mg total) by mouth every morning.  30 capsule  0  . levothyroxine (SYNTHROID, LEVOTHROID) 75 MCG tablet Take 1 tablet (75 mcg total) by mouth daily before breakfast.  30 tablet  0  . losartan (COZAAR) 50 MG tablet Take  1 tablet (50 mg total) by mouth daily.  30 tablet  0  . metFORMIN (GLUCOPHAGE) 1000 MG tablet Take 1 tablet (1,000 mg total) by mouth 2 (two) times daily with a meal.  60 tablet  0  . methocarbamol (ROBAXIN) 500 MG tablet Take 1 tablet (500 mg total) by mouth every 6 (six) hours as needed.  80 tablet  1  . omeprazole (PRILOSEC) 20 MG capsule Take 1 capsule (20 mg total) by mouth 2 (two) times daily.  60 capsule  0  . ondansetron (ZOFRAN) 4 MG tablet Take 1 tablet (4 mg total) by mouth every 8 (eight) hours as needed for nausea.  20 tablet  0  . polyethylene glycol powder (GLYCOLAX/MIRALAX) powder Take 17  g by mouth 2 (two) times daily.  3350 g  0  . potassium chloride (KLOR-CON) 20 MEQ packet Take 20 mEq by mouth daily.  30 tablet  0  . promethazine (PHENERGAN) 12.5 MG tablet Take 1 tablet (12.5 mg total) by mouth every 8 (eight) hours as needed for nausea.  20 tablet  0  . propranolol (INDERAL) 40 MG tablet Take 40 mg by mouth daily.      . sitaGLIPtin (JANUVIA) 100 MG tablet Take 1 tablet (100 mg total) by mouth every morning.  30 tablet  0  . traMADol (ULTRAM) 50 MG tablet Take 1-2 tablets (50-100 mg total) by mouth every 6 (six) hours as needed.  80 tablet  1  . Travoprost, BAK Free, (TRAVATAN) 0.004 % SOLN ophthalmic solution Place 1 drop into both eyes at bedtime.  1 Bottle  0  . triamcinolone cream (KENALOG) 0.5 % APPLY TOPICALLY TO ITCHY AREAS TWICE A DAY AS NEEDED FOR ITCHING UNTIL 2 TO 3 DAYS AFTER HEALED  2 g  1  . zolpidem (AMBIEN) 5 MG tablet Take 5 mg by mouth at bedtime as needed for sleep.      . [DISCONTINUED] albuterol (PROVENTIL,VENTOLIN) 90 MCG/ACT inhaler Inhale 2 puffs into the lungs every 4 (four) hours as needed for wheezing or shortness of breath.  17 g  5   No current facility-administered medications for this visit.    Past Medical History  Diagnosis Date  . Diabetes mellitus   . Hypertension   . Hypothyroidism   . COPD (chronic obstructive pulmonary disease)   . Heart murmur   . GERD (gastroesophageal reflux disease)   . 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 [place].  OFFICE NOTE DR. A. LITTLE STATES  "EXTREMELY PACEMAKER SENSITIVE AND WHEN YOU TRY TO CHECK FOR UNDERLYING RHYTHMS SHE WILL HAVE SNYCOPE AND WE HAVE NOT DONE THIS NOW IN ABOUT 2 YEARS".  . Normal coronary arteries 2011  . Complete heart block     Past Surgical History  Procedure Laterality Date  . Pacemaker insertion  1988    last gen 11/08- [facility]  . Tubal ligation    . Cardiac catheterization  2011     SHOWED NO CAD-PER  CARDIOLOGY OFFICE NOTES DR. A. LITTLE  . Atrioventricular cushion defect repair  1988  . Total knee arthroplasty Right 11/19/2012    Procedure: RIGHT TOTAL KNEE ARTHROPLASTY;  Surgeon: Gearlean Alf, MD;  Location: [facility];  Service: Orthopedics;  Laterality: Right;    Family History  Problem Relation Age of Onset  . Heart failure Mother   . Heart failure Sister     History  Social History  . Marital Status: Divorced    Spouse Name: N/A    Number of Children: N/A  . Years of Education: N/A   Occupational History  . Not on file.   Social History Main Topics  . Smoking status: Never Smoker   . Smokeless tobacco: Never Used  . Alcohol Use: No  . Drug Use: No  . Sexual Activity: Not on file   Other Topics Concern  . Not on file   Social History Narrative  . No narrative on file    Review of systems: The patient specifically denies any chest pain at rest or with exertion, dyspnea at rest, orthopnea, paroxysmal nocturnal dyspnea, syncope, palpitations, focal neurological deficits, intermittent claudication, lower extremity edema, unexplained weight gain, cough, hemoptysis or wheezing.  The patient also denies abdominal pain, nausea, vomiting, dysphagia, diarrhea, constipation, polyuria, polydipsia, dysuria, hematuria, frequency, urgency, abnormal bleeding or bruising, fever, chills, unexpected weight changes, mood swings, change in skin or hair texture, change in voice quality, auditory or visual problems, allergic reactions or rashes, new musculoskeletal complaints other than usual "aches and pains".   PHYSICAL EXAM BP 112/60  Pulse 83  Resp 16  Ht 5\' 2"  (1.575 m)  Wt 61.689 kg (136 lb)  BMI 24.87 kg/m2  General: Alert, oriented x3, no distress  Head: no evidence of trauma, PERRL, EOMI, no exophtalmos or lid lag, no myxedema, no xanthelasma; normal ears, nose and oropharynx  Neck: normal jugular venous pulsations and no hepatojugular reflux; brisk carotid pulses  without delay and no carotid bruits  Chest: clear to auscultation, no signs of consolidation by percussion or palpation, normal fremitus, symmetrical and full respiratory excursions; scar in the left subclavian area where the pacemaker scar located looks healthy; also has a scar of previous right subclavian pacemaker  Cardiovascular: normal position and quality of the apical impulse, regular rhythm, normal first and second heart sounds, no murmurs, rubs or gallops  Abdomen: no tenderness or distention, no masses by palpation, no abnormal pulsatility or arterial bruits, normal bowel sounds, no hepatosplenomegaly  Extremities: no clubbing, cyanosis or edema; 2+ radial, ulnar and brachial pulses bilaterally; 2+ right femoral, posterior tibial and dorsalis pedis pulses; 2+ left femoral, posterior tibial and dorsalis pedis pulses; no subclavian or femoral bruits  Neurological: grossly nonfocal    EKG: AV sequential pacing  Lipid Panel     Component Value Date/Time   CHOL 120 10/03/2012 1002   TRIG 168* 10/03/2012 1002   HDL 36* 10/03/2012 1002   CHOLHDL 3.3 10/03/2012 1002   VLDL 34 10/03/2012 1002   LDLCALC 50 10/03/2012 1002    BMET    Component Value Date/Time   NA 141 12/21/2012 1029   K 4.2 12/21/2012 1029   CL 99 12/21/2012 1029   CO2 32 12/21/2012 1029   GLUCOSE 215* 12/21/2012 1029   BUN 13 12/21/2012 1029   CREATININE 0.68 12/21/2012 1029   CREATININE 0.59 11/23/2012 0405   CALCIUM 9.8 12/21/2012 1029   GFRNONAA >90 11/23/2012 0405   GFRAA >90 11/23/2012 0405     ASSESSMENT AND PLAN Normal function of her dual-chamber Medtronic permanent pacemaker. She is pacemaker dependent secondary to complete heart block. No changes are made to her chronic device settings. Followup for 6 months  Patient Instructions  Your physician recommends that you schedule a follow-up appointment in: 6 months with Dr.Tawney Vanorman    Orders Placed This Encounter  Procedures  . EKG 12-Lead   Meds ordered this  encounter  Medications  .  propranolol (INDERAL) 40 MG tablet    Sig: Take 40 mg by mouth daily.    Holli Humbles, MD, [place] [phone] office [phone] pager

## 2013-10-14 ENCOUNTER — Encounter: Payer: Self-pay | Admitting: Family Medicine

## 2013-10-14 NOTE — Telephone Encounter (Signed)
Had to leave message at number listed because automated system was not recognizing glyburide. Left message for request for glyburide 10mg  BID and left our phone number and fax (requesting they fax prior auth form or list of meds on formulary) along with patient's information that they requested on answering machine. Please help follow up with this if you know an easier way Mercedes Barr.  Thanks!  Hilton Sinclair, MD

## 2013-10-17 NOTE — Telephone Encounter (Signed)
Left message for pt needing pharmacy prescription ID number to complete PA for Glyburide.  Derl Barrow, RN

## 2013-10-21 ENCOUNTER — Ambulatory Visit (INDEPENDENT_AMBULATORY_CARE_PROVIDER_SITE_OTHER): Payer: Medicare Other | Admitting: Family Medicine

## 2013-10-21 ENCOUNTER — Encounter: Payer: Self-pay | Admitting: Family Medicine

## 2013-10-21 ENCOUNTER — Encounter: Payer: Self-pay | Admitting: Cardiovascular Disease

## 2013-10-21 VITALS — BP 110/77 | HR 73 | Temp 97.9°F | Ht 62.0 in | Wt 137.1 lb

## 2013-10-21 DIAGNOSIS — I10 Essential (primary) hypertension: Secondary | ICD-10-CM

## 2013-10-21 DIAGNOSIS — Z23 Encounter for immunization: Secondary | ICD-10-CM

## 2013-10-21 DIAGNOSIS — J449 Chronic obstructive pulmonary disease, unspecified: Secondary | ICD-10-CM

## 2013-10-21 DIAGNOSIS — E119 Type 2 diabetes mellitus without complications: Secondary | ICD-10-CM

## 2013-10-21 DIAGNOSIS — J4489 Other specified chronic obstructive pulmonary disease: Secondary | ICD-10-CM

## 2013-10-21 DIAGNOSIS — G4700 Insomnia, unspecified: Secondary | ICD-10-CM

## 2013-10-21 DIAGNOSIS — R0602 Shortness of breath: Secondary | ICD-10-CM

## 2013-10-21 LAB — BASIC METABOLIC PANEL
BUN: 16 mg/dL (ref 6–23)
CO2: 33 meq/L — AB (ref 19–32)
Calcium: 9.9 mg/dL (ref 8.4–10.5)
Chloride: 96 mEq/L (ref 96–112)
Creat: 0.77 mg/dL (ref 0.50–1.10)
Glucose, Bld: 138 mg/dL — ABNORMAL HIGH (ref 70–99)
Potassium: 3.2 mEq/L — ABNORMAL LOW (ref 3.5–5.3)
SODIUM: 143 meq/L (ref 135–145)

## 2013-10-21 LAB — POCT GLYCOSYLATED HEMOGLOBIN (HGB A1C): Hemoglobin A1C: 6.9

## 2013-10-21 MED ORDER — TRAZODONE 25 MG HALF TABLET: 25.0000 mg | ORAL_TABLET | Freq: Every day | ORAL | Status: DC

## 2013-10-21 NOTE — Patient Instructions (Signed)
Good to meet you today!  I will refill medications today. We are going to check labs and I will call you if anything is NOT normal. For sleep, come back in 2 weeks and we can discuss. In the meantime, try trazodone 25 mg daily. Call me in 1 week if no improvement and we can discuss increasing. Also try to cut out TV like we discussed at 1 AM.  Mercedes Sinclair, MD   Insomnia Insomnia is frequent trouble falling and/or staying asleep. Insomnia can be a long term problem or a short term problem. Both are common. Insomnia can be a short term problem when the wakefulness is related to a certain stress or worry. Long term insomnia is often related to ongoing stress during waking hours and/or poor sleeping habits. Overtime, sleep deprivation itself can make the problem worse. Every little thing feels more severe because you are overtired and your ability to cope is decreased. CAUSES   Stress, anxiety, and depression.  Poor sleeping habits.  Distractions such as TV in the bedroom.  Naps close to bedtime.  Engaging in emotionally charged conversations before bed.  Technical reading before sleep.  Alcohol and other sedatives. They may make the problem worse. They can hurt normal sleep patterns and normal dream activity.  Stimulants such as caffeine for several hours prior to bedtime.  Pain syndromes and shortness of breath can cause insomnia.  Exercise late at night.  Changing time zones may cause sleeping problems (jet lag). It is sometimes helpful to have someone observe your sleeping patterns. They should look for periods of not breathing during the night (sleep apnea). They should also look to see how long those periods last. If you live alone or observers are uncertain, you can also be observed at a sleep clinic where your sleep patterns will be professionally monitored. Sleep apnea requires a checkup and treatment. Give your caregivers your medical history. Give your caregivers  observations your family has made about your sleep.  SYMPTOMS   Not feeling rested in the morning.  Anxiety and restlessness at bedtime.  Difficulty falling and staying asleep. TREATMENT   Your caregiver may prescribe treatment for an underlying medical disorders. Your caregiver can give advice or help if you are using alcohol or other drugs for self-medication. Treatment of underlying problems will usually eliminate insomnia problems.  Medications can be prescribed for short time use. They are generally not recommended for lengthy use.  Over-the-counter sleep medicines are not recommended for lengthy use. They can be habit forming.  You can promote easier sleeping by making lifestyle changes such as:  Using relaxation techniques that help with breathing and reduce muscle tension.  Exercising earlier in the day.  Changing your diet and the time of your last meal. No night time snacks.  Establish a regular time to go to bed.  Counseling can help with stressful problems and worry.  Soothing music and white noise may be helpful if there are background noises you cannot remove.  Stop tedious detailed work at least one hour before bedtime. HOME CARE INSTRUCTIONS   Keep a diary. Inform your caregiver about your progress. This includes any medication side effects. See your caregiver regularly. Take note of:  Times when you are asleep.  Times when you are awake during the night.  The quality of your sleep.  How you feel the next day. This information will help your caregiver care for you.  Get out of bed if you are still awake after  15 minutes. Read or do some quiet activity. Keep the lights down. Wait until you feel sleepy and go back to bed.  Keep regular sleeping and waking hours. Avoid naps.  Exercise regularly.  Avoid distractions at bedtime. Distractions include watching television or engaging in any intense or detailed activity like attempting to balance the household  checkbook.  Develop a bedtime ritual. Keep a familiar routine of bathing, brushing your teeth, climbing into bed at the same time each night, listening to soothing music. Routines increase the success of falling to sleep faster.  Use relaxation techniques. This can be using breathing and muscle tension release routines. It can also include visualizing peaceful scenes. You can also help control troubling or intruding thoughts by keeping your mind occupied with boring or repetitive thoughts like the old concept of counting sheep. You can make it more creative like imagining planting one beautiful flower after another in your backyard garden.  During your day, work to eliminate stress. When this is not possible use some of the previous suggestions to help reduce the anxiety that accompanies stressful situations. MAKE SURE YOU:   Understand these instructions.  Will watch your condition.  Will get help right away if you are not doing well or get worse. Document Released: 07/22/2000 Document Revised: 10/17/2011 Document Reviewed: 08/22/2007 [facility] Patient Information 2014 [place].

## 2013-10-21 NOTE — Progress Notes (Signed)
Patient ID: Mercedes Barr, female   DOB: 06/02/1946, 68 y.o.   MRN: [id number] Subjective:   CC: Medication refill  HPI:   Shortness of breath - H/o COPD and takes albuterol and flovent both when short of breath. Also takes lasix (unable to find reason why but pt states per cardiologist) and being out for a while has made her feel chest tightness/sob. Has pacemaker with normal function per recent cardiology visit.  Difficulty with sleep - Gets up at 1 AM and watches TV until 3-4 AM, then up at [place]. She has had this issue for ~1 year. She used [place] >1 year ago and it initially worked but then stopped working. She snores. She is tired in morning.  Diabetes - Out of glyburide. A1c being checked this visit. Needs prior auth form filled out with [facility] Rx number.   Review of Systems - Per HPI. Additionally, she wants the flu shot today.  Meds: - Needs refills on all.  Smoking status: Never smoker    Objective:  Physical Exam BP 110/77  Pulse 73  Temp(Src) 97.9 F (36.6 C) (Oral)  Ht 5\' 2"  (1.575 m)  Wt 137 lb 1.6 oz (62.188 kg)  BMI 25.07 kg/m2 GEN: NAD, pleasant HEENT: Atraumatic, normocephalic, neck supple, EOMI, sclera clear  CV: RRR, faint systolic murmur PULM: CTAB, normal effort ABD: Soft, nontender, nondistended SKIN: No rash or cyanosis; warm and well-perfused EXTR: No lower extremity edema or calf tenderness PSYCH: Mood and affect euthymic, normal rate and volume of speech NEURO: Awake, alert, no focal deficits grossly, normal speech  Assessment:     Mercedes Barr is a 68 y.o. female here for medication refill.    Plan:   # Health Maintenance: Flu shot today. Discussed that we are nearing end of flu season and to get it again this coming Oct for optimal coverage. - Refilled all medications.  Follow-up: Follow up in 2 weeks for full sleep eval.   Hilton Sinclair, MD [place]

## 2013-10-22 NOTE — Telephone Encounter (Signed)
PA form for Glyburide 5 mg tab faxed to [facility] for review.  Derl Barrow, RN

## 2013-10-23 NOTE — Telephone Encounter (Signed)
Called [employer] for another formulary other than the Glypizide.  The representative stated that was the only medication listed.  You can request an appeal.  Derl Barrow, RN

## 2013-10-23 NOTE — Telephone Encounter (Signed)
PA. For Glyburide 5 mg tab was denied.  Denial placed in provider box for review.  Derl Barrow, RN

## 2013-10-23 NOTE — Telephone Encounter (Signed)
I really would like her on something for her diabetes. Could we find out what is on formulary now that PA has been denied?  Hilton Sinclair, MD

## 2013-10-23 NOTE — Telephone Encounter (Signed)
It stated pt should try Glypizide for at least 30 days. Derl Barrow, RN

## 2013-10-25 ENCOUNTER — Telehealth: Payer: Self-pay | Admitting: Family Medicine

## 2013-10-25 NOTE — Telephone Encounter (Signed)
Pt is concerned about her bp. This morning it was 86/55 in one arm and 89/55. She googled it and she started drinking water and ate some salt. It increased to 107/52 and 110./55. She wants to know if this is ok---should she be concerned, should she be doing other things? Please advise

## 2013-10-28 ENCOUNTER — Telehealth: Payer: Self-pay | Admitting: Family Medicine

## 2013-10-28 MED ORDER — GLIPIZIDE ER 10 MG PO TB24: 10.0000 mg | ORAL_TABLET | Freq: Every day | ORAL | Status: DC

## 2013-10-28 NOTE — Telephone Encounter (Addendum)
Pt and I spoke, and I discussed with her that A1c was improved to 6.9. She has been walking 2 miles around the track 3 times weekly. K is mildly low and she reports only taking every other day, so I suggested increasing to rx'ed daily (or near daily) dosing of home [place].     BLUE TEAM: Please call patient to let her know that I sent in glipizide XL 10mg  daily (once daily) since it seems her insurance will cover this (switching from glyburide). If this is costly, I think she can get it at [facility] for free.    Hilton Sinclair, MD\

## 2013-10-28 NOTE — Addendum Note (Signed)
Addended by: Conni Slipper T on: 10/28/2013 09:53 AM   Modules accepted: Orders, Medications

## 2013-10-28 NOTE — Telephone Encounter (Signed)
[employer] is the company that does her test strips. They are telling pt she needs a new prescription  [phone]- phone

## 2013-10-28 NOTE — Telephone Encounter (Signed)
Called to discuss with patient. She thinks that she was not drinking enough and had started increasing physical activity. She started drinking more fluids especially on days she was active, and BP is stable now. She has no further concerns about this. Hilton Sinclair, MD

## 2013-10-28 NOTE — Telephone Encounter (Signed)
I am confused about how this is still an issue. See phone note conversation 09/30/13. Please help, Blue Team.  Hilton Sinclair, MD

## 2013-10-28 NOTE — Telephone Encounter (Signed)
Pt says Mercedes Barr is still telling her she needs a new prescription. When I read her the notes about this, pt doesn't understand why they are still needing the RX

## 2013-10-28 NOTE — Addendum Note (Signed)
Addended by: Conni Slipper T on: 10/28/2013 12:10 PM   Modules accepted: Orders

## 2013-10-29 DIAGNOSIS — R0602 Shortness of breath: Secondary | ICD-10-CM | POA: Insufficient documentation

## 2013-10-29 NOTE — Telephone Encounter (Signed)
Pt informed. Mercedes Barr Dawn  

## 2013-10-29 NOTE — Addendum Note (Signed)
Addended by: Conni Slipper T on: 10/29/2013 10:04 PM   Modules accepted: Level of Service

## 2013-10-29 NOTE — Assessment & Plan Note (Signed)
Previously managed on [place] which was working less effectively, stopped >1 year ago. Possible contribution of poor sleep hygiene with watching TV. - Cut out midnight TV. - Trazodone 25mg  QHS. - Call in 1 week if dose not working and can increase to 50mg . - F/u in 2 weeks and further discuss sleep hygiene. - If snoring a consistent problem, eval for OSA.

## 2013-10-29 NOTE — Assessment & Plan Note (Addendum)
Lungs clear. Pt reports taking flovent and albuterol as needed. But reports some incr SOB (mild). - Refilled both. - Discussed taking flovent daily and albuterol as needed. - Return precau reviewed.

## 2013-10-29 NOTE — Telephone Encounter (Signed)
Called pt's insurance from number listed below.  Faxing a new form for provider to complete because they had a different provider listed on Rx for the diabetic supplies. Once form is received, it will be placed in provider box for review.  Derl Barrow, RN

## 2013-10-29 NOTE — Assessment & Plan Note (Addendum)
Very mild, no abnormal physical findings or vital sign abnormalities. Speaks to me in sentences and normal breathing on ambulation. - Refilled lasix; no h/o CHF so uncertain of indication here but per pt it is from cardiology rec. - Continue K. BMET>>>K 3.2. Recommended K-rich foods for now. - F/u with BMET at next visit (2 w). - F/u with cardiologist. - If still SOB, EKG, CXR, and echo. - Return precau reviewed.

## 2013-10-29 NOTE — Assessment & Plan Note (Signed)
Previous suboptimal control with A1c 8.3. - A1c today. - Refilled januvia, metformin, glyburide, losartan, aspirin. - Recheck A1c in 3 mo. - Needs eye and foot exam.

## 2013-10-31 ENCOUNTER — Telehealth: Payer: Self-pay | Admitting: Family Medicine

## 2013-10-31 NOTE — Telephone Encounter (Signed)
Refill request for albuterol and other breathing meds. Please fax meds to [phone].

## 2013-11-04 ENCOUNTER — Telehealth: Payer: Self-pay | Admitting: Family Medicine

## 2013-11-04 NOTE — Telephone Encounter (Signed)
[employer]  [phone] option 3. Pt needs her prescripions sent to [employer] ( not diabetic or breathing supplies) Please call [employer]  Please advise

## 2013-11-04 NOTE — Telephone Encounter (Signed)
Pt states that they keep telling her that they haven't received any refills on any of her medication.  She is completely out.  If we can print and fax to ensure that they are receiving it.  Please advise.  Jazmin Hartsell,CMA

## 2013-11-05 MED ORDER — TRIAMCINOLONE ACETONIDE 0.5 % EX CREA: TOPICAL_CREAM | CUTANEOUS | Status: DC

## 2013-11-05 MED ORDER — EPINEPHRINE 0.3 MG/0.3ML IJ SOAJ: 0.3000 mg | INTRAMUSCULAR | Status: DC | PRN

## 2013-11-05 MED ORDER — TRAVOPROST (BAK FREE) 0.004 % OP SOLN: 1.0000 [drp] | Freq: Every day | OPHTHALMIC | Status: DC

## 2013-11-05 MED ORDER — METHOCARBAMOL 500 MG PO TABS: 500.0000 mg | ORAL_TABLET | ORAL | Status: DC

## 2013-11-05 MED ORDER — POTASSIUM CHLORIDE CRYS ER 20 MEQ PO TBCR: 20.0000 meq | EXTENDED_RELEASE_TABLET | Freq: Every day | ORAL | Status: DC

## 2013-11-05 MED ORDER — PROPRANOLOL HCL 40 MG PO TABS: 40.0000 mg | ORAL_TABLET | Freq: Every day | ORAL | Status: DC

## 2013-11-05 MED ORDER — HYDROCHLOROTHIAZIDE 12.5 MG PO CAPS: 12.5000 mg | ORAL_CAPSULE | Freq: Every morning | ORAL | Status: DC

## 2013-11-05 MED ORDER — OMEPRAZOLE 20 MG PO CPDR: 20.0000 mg | DELAYED_RELEASE_CAPSULE | Freq: Two times a day (BID) | ORAL | Status: DC

## 2013-11-05 MED ORDER — CITALOPRAM HYDROBROMIDE 20 MG PO TABS: 20.0000 mg | ORAL_TABLET | Freq: Every morning | ORAL | Status: DC

## 2013-11-05 MED ORDER — FLUTICASONE PROPIONATE HFA 110 MCG/ACT IN AERO: 1.0000 | INHALATION_SPRAY | Freq: Two times a day (BID) | RESPIRATORY_TRACT | Status: DC

## 2013-11-05 MED ORDER — LOSARTAN POTASSIUM 50 MG PO TABS: 50.0000 mg | ORAL_TABLET | Freq: Every day | ORAL | Status: DC

## 2013-11-05 MED ORDER — LEVOTHYROXINE SODIUM 75 MCG PO TABS: 75.0000 ug | ORAL_TABLET | Freq: Every day | ORAL | Status: DC

## 2013-11-05 MED ORDER — ALBUTEROL SULFATE HFA 108 (90 BASE) MCG/ACT IN AERS: 2.0000 | INHALATION_SPRAY | RESPIRATORY_TRACT | Status: DC | PRN

## 2013-11-05 MED ORDER — METFORMIN HCL 1000 MG PO TABS: 1000.0000 mg | ORAL_TABLET | Freq: Two times a day (BID) | ORAL | Status: DC

## 2013-11-05 MED ORDER — SITAGLIPTIN PHOSPHATE 100 MG PO TABS: 100.0000 mg | ORAL_TABLET | Freq: Every morning | ORAL | Status: DC

## 2013-11-05 MED ORDER — FUROSEMIDE 20 MG PO TABS: 20.0000 mg | ORAL_TABLET | Freq: Every morning | ORAL | Status: DC

## 2013-11-05 NOTE — Telephone Encounter (Signed)
Called to discuss meds with patient. She has been out of medications almost 2 months now due to refills not going through. Will try again today with 6 months refill on most, but also plan for f/u as discussed in a few weeks for sleep.  Refilling:  Citalopram 20mg  daily epipen Fluticasone inhaler Furosemide 20mg  daily hctz 12.5mg  daily levothyroxine 15mcg daily Losartan 50 mg daily Metformin 1000mg   BID Methocarbamol 500 q week for back spasms Omeprazole 20mg  Kdur 20 meq tab Propranolol 40mg  tab sitagliptin 100mg  travoprost for eyes Triamcinolone 0.5%  Hold off on tramadol for now. Will discuss when next see pt and she is okay with this. Doesn't need miralax for now Hold off on zofran bc doesn't really need. She was able to obtain glipizide.  Issues for follow-up:  -Of note, Propranolol daily is below BID recommendation - eval at f/u. -For sleep, she plans to f/u in 1-2 weeks and trazodone 1 tab helping some.  Hilton Sinclair, MD

## 2013-11-05 NOTE — Telephone Encounter (Signed)
Tamika, wasn't there an issue with this you had last week after my visit with her, when none of them went through and you called [employer] to approve over phone?  Hilton Sinclair, MD

## 2013-11-05 NOTE — Telephone Encounter (Signed)
Printing and faxing albuterol and flovent. Had tried refilling both at last visit but did not go through. Looking into what else patient still needs (see other phone note).  Mercedes Sinclair, MD

## 2013-11-05 NOTE — Telephone Encounter (Signed)
I think that was a different pt when I phoned in the medications. I called pt, left a voice message asking her what medications needed to be refilled.   Derl Barrow, RN

## 2013-11-06 ENCOUNTER — Telehealth: Payer: Self-pay | Admitting: Family Medicine

## 2013-11-06 NOTE — Telephone Encounter (Signed)
Spoke with Clair Gulling at [facility] and approved the following: - 80g tube of kenalog  - epipen - 20 day supply of celexa - 90 day supply of robaxin (15 tabs)  -Will discuss propranolol with patient with COPD, HTN, complete heart block, tremor, no h/o CAD.  Hilton Sinclair, MD

## 2013-11-11 NOTE — Telephone Encounter (Signed)
Will route this question to patient's cardiologist - she is currently on propranolol 40mg  daily. My impression is that with pacemaker for complete heart block and with COPD, beta blocker is not the best option. I am guessing she was on it due to HTN in setting of tremor, but she also has no h/o CAD. I am interested in switching to maybe norvasc in addition to her HCTZ and losartan but wanted to run this by Dr Sallyanne Kuster.  Thank you.  Hilton Sinclair, MD

## 2013-11-13 ENCOUNTER — Encounter: Payer: Self-pay | Admitting: Family Medicine

## 2013-11-13 ENCOUNTER — Ambulatory Visit (INDEPENDENT_AMBULATORY_CARE_PROVIDER_SITE_OTHER): Payer: Medicare Other | Admitting: Family Medicine

## 2013-11-13 VITALS — BP 124/79 | HR 90 | Temp 98.5°F | Wt 136.6 lb

## 2013-11-13 DIAGNOSIS — L989 Disorder of the skin and subcutaneous tissue, unspecified: Secondary | ICD-10-CM

## 2013-11-13 DIAGNOSIS — E119 Type 2 diabetes mellitus without complications: Secondary | ICD-10-CM

## 2013-11-13 DIAGNOSIS — G4700 Insomnia, unspecified: Secondary | ICD-10-CM

## 2013-11-13 DIAGNOSIS — R059 Cough, unspecified: Secondary | ICD-10-CM | POA: Insufficient documentation

## 2013-11-13 DIAGNOSIS — R05 Cough: Secondary | ICD-10-CM

## 2013-11-13 MED ORDER — TRAZODONE HCL 50 MG PO TABS: 50.0000 mg | ORAL_TABLET | Freq: Every day | ORAL | Status: DC

## 2013-11-13 NOTE — Assessment & Plan Note (Signed)
A: She has either cut her heel or her dry skin has cracked. No signs of infection and she has good sensation of her foot. Starting to heal without complication  P: - Keep area clean an dry - Antibiotic ointment to sore - Hydrate skin - Cover with bandage when in public - If becomes infected she should come in right away, patient agrees.

## 2013-11-13 NOTE — Assessment & Plan Note (Signed)
A: Trazodone helping some, but not completely. She is on 3rd week currently.  P: - Increase to 50mg  qhs. New Rx sent to [facility] - F/u with PCP to discuss anxiety and other issues interfering with her sleep patterns

## 2013-11-13 NOTE — Assessment & Plan Note (Signed)
A: No red flags on exam or history. Could be due to chemical pneumonitis from cleaning bleach or she could be coming down with a cold.   P: - Reassurance given - Con't her COPD medications - Use Albuterol prior to lying down - OTC cough medications, if needed - If she has worsening SOB, coughing up anything or spikes a fever she should let us know

## 2013-11-13 NOTE — Patient Instructions (Signed)
We will send you to the foot doctor. Keep your heel nice and moist, and cover it with a bandaid when you are in public.  Try using your inhaler if you start coughing. If anything changes or gets worse, please let us know!  Please increase your Trazodone to 50mg . I have given you a new prescription.  Follow up with Dr. Darene Lamer within the next month.  Mitzy Naron M. Teela Narducci, M.D.

## 2013-11-13 NOTE — Progress Notes (Signed)
Patient ID: KHRYSTINA BONNES, female   DOB: Jan 20, 1946, 68 y.o.   MRN: [id number]    Subjective: HPI: Patient is a 68 y.o. female presenting to clinic today for follow up appointment. Concerns today include cough and foot pain  1. Cough- Started last night, worse with lying flat. She states she is not coughing anything up. No known sick contacts. She did use some bleach to clean yesterday. Otherwise, her COPD is well controlled. She states she was wheezing. No swelling, no chest pain.  2. Foot injury- Patient has an injury on her left heel for last 4-5 days. No known injury, but states she walked in her bedroom slippers to the mailbox and thinks she cut it. Has not tried anything. No fevers, no pus, no swelling. Would like to see a foot doctor.  3. Sleeping pills- Was started on Trazodone 25mg  qhs at last visit. Helping a little but not much. She has been on this for 3 weeks.   History Reviewed: Non smoker.  ROS: Please see HPI above.  Objective: Office vital signs reviewed. BP 124/79  Pulse 90  Temp(Src) 98.5 F (36.9 C) (Oral)  Wt 136 lb 9.6 oz (61.961 kg)  Physical Examination:  General: Awake, alert. NAD HEENT: Atraumatic, normocephalic. MMM. Neck: No masses palpated. No LAD Pulm: CTAB, no wheezes, good effort Cardio: RRR, no murmurs appreciated Abdomen:+BS, soft, nontender, nondistended Extremities: No edema. 2 cm linear laceration on left posterior heel without surrounding erythema. Skin on feet diffusely dry. No bleeding or discharge. TTP of area Neuro: Strength and sensation grossly intact  Assessment: 68 y.o. female follow up  Plan: See Problem List and After Visit Summary

## 2013-11-14 ENCOUNTER — Telehealth: Payer: Self-pay | Admitting: Family Medicine

## 2013-11-14 NOTE — Telephone Encounter (Signed)
Will forward to [employer]. Jazmin Hartsell,CMA  

## 2013-11-14 NOTE — Telephone Encounter (Signed)
Pt called because the referral she needed is for [facility] not [facility]. Can we change this. jw

## 2013-11-15 NOTE — Telephone Encounter (Signed)
In the mean time, Dr Sheral Apley discussed with pt at last visit and she would like to think about this and plans to f/u with me in 1 month.  Hilton Sinclair, MD

## 2013-11-18 NOTE — Telephone Encounter (Signed)
I see no particular reason she needs to be on a beta blocker - may have been prescribed for tremor, as you suggested. Can replace it with another hypertensive.

## 2013-11-19 ENCOUNTER — Telehealth: Payer: Self-pay | Admitting: Family Medicine

## 2013-11-19 NOTE — Telephone Encounter (Signed)
Covering box for Dr. Dianah Field  Request for approval of increase in trazadone given she is on celexa and there is a risk of Qt prolongation.   Only small increase and pt has pacemaker for complete heart block So i think this is ok.   Laroy Apple, MD [facility] Resident, PGY-2 11/19/2013, 12:11 PM

## 2013-11-20 ENCOUNTER — Telehealth: Payer: Self-pay | Admitting: *Deleted

## 2013-11-20 NOTE — Telephone Encounter (Signed)
Received fax from [facility] regarding Nebulizer.  Needing provider signature.  Phone call placed to pt to confirm this pharmacy.  Per pt this is the pharmacy she receives all her breathing treatment and supplies. Form placed in provider for review.   Derl Barrow, RN

## 2013-11-23 ENCOUNTER — Emergency Department (INDEPENDENT_AMBULATORY_CARE_PROVIDER_SITE_OTHER)
Admission: EM | Admit: 2013-11-23 | Discharge: 2013-11-23 | Disposition: A | Discharge status: Home / Self Care | Payer: Medicare Other | Source: Home / Self Care

## 2013-11-23 ENCOUNTER — Encounter (HOSPITAL_COMMUNITY): Payer: Self-pay | Admitting: Emergency Medicine

## 2013-11-23 DIAGNOSIS — J029 Acute pharyngitis, unspecified: Secondary | ICD-10-CM

## 2013-11-23 DIAGNOSIS — L049 Acute lymphadenitis, unspecified: Secondary | ICD-10-CM

## 2013-11-23 MED ORDER — AMOXICILLIN 500 MG PO CAPS: 500.0000 mg | ORAL_CAPSULE | Freq: Two times a day (BID) | ORAL | Status: AC

## 2013-11-23 MED ORDER — AMOXICILLIN 500 MG PO CAPS
500.0000 mg | ORAL_CAPSULE | Freq: Two times a day (BID) | ORAL | Status: DC
Start: 2013-11-23 — End: 2013-11-23

## 2013-11-23 NOTE — ED Provider Notes (Signed)
Medical screening examination/treatment/procedure(s) were performed by non-physician practitioner and as supervising physician I was immediately available for consultation/collaboration.  Philipp Deputy, M.D.   Harden Mo, MD 11/23/13 [phone]

## 2013-11-23 NOTE — ED Provider Notes (Signed)
CSN: [id number]     Arrival date & time 11/23/13  1114 History   None    Chief Complaint  Patient presents with  . Sore Throat  . Lymphadenopathy   (Consider location/radiation/quality/duration/timing/severity/associated sxs/prior Treatment) HPI Comments: Patient presenst with a 4 day history of sore throat and painful right lymph node. Mild non-productive cough and congestion. Mild dyspnea; known COPD. No fever or chills. No overwhelming fatigue. No sinus issues. No wheeze. See remainder of ROS   Patient is a 68 y.o. female presenting with pharyngitis. The history is provided by the patient.  Sore Throat    Past Medical History  Diagnosis Date  . Diabetes mellitus   . Hypertension   . Hypothyroidism   . COPD (chronic obstructive pulmonary disease)   . Heart murmur   . GERD (gastroesophageal reflux disease)   . 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 [place]un[place].  OFFICE NOTE DR. A. LITTLE STATES  "EXTREMELY PACEMAKER SENSITIVE AND WHEN YOU TRY TO CHECK FOR UNDERLYING RHYTHMS SHE WILL HAVE SNYCOPE AND WE HAVE NOT DONE THIS NOW IN ABOUT 2 YEARS".  . Normal coronary arteries 2011  . Complete heart block    Past Surgical History  Procedure Laterality Date  . Pacemaker insertion  1988    last gen 11/08- [facility]  . Tubal ligation    . Cardiac catheterization  2011     SHOWED NO CAD-PER CARDIOLOGY OFFICE NOTES DR. A. LITTLE  . Atrioventricular cushion defect repair  1988  . Total knee arthroplasty Right 11/19/2012    Procedure: RIGHT TOTAL KNEE ARTHROPLASTY;  Surgeon: Gearlean Alf, MD;  Location: [facility];  Service: Orthopedics;  Laterality: Right;   Family History  Problem Relation Age of Onset  . Heart failure Mother   . Heart failure Sister    History  Substance Use Topics  . Smoking status: Never Smoker   . Smokeless tobacco: Never Used  . Alcohol Use: No   OB History   Grav Para Term Preterm Abortions TAB  SAB Ect Mult Living                 Review of Systems  Constitutional: Negative for fever, chills and fatigue.  HENT: Positive for ear pain and sore throat. Negative for congestion, mouth sores, postnasal drip, rhinorrhea, sinus pressure and trouble swallowing.   Eyes: Negative.   Respiratory: Positive for cough. Negative for chest tightness, wheezing and stridor.   Gastrointestinal: Negative.   Musculoskeletal: Negative for joint swelling and neck pain.  Skin: Negative.   Allergic/Immunologic: Negative.   Neurological: Negative.   Hematological: Negative.   Psychiatric/Behavioral: Negative.     Allergies  Chlorhexidine; Amitriptyline hcl; Benazepril hcl; Naproxen; Propoxyphene n-acetaminophen; and Statins  Home Medications   Prior to Admission medications   Medication Sig Start Date End Date Taking? Authorizing Provider  aspirin 81 MG tablet Take 1 tablet (81 mg total) by mouth daily. 08/06/13  Yes Hilton Sinclair, MD  citalopram (CELEXA) 20 MG tablet Take 1 tablet (20 mg total) by mouth every morning. 11/05/13  Yes Hilton Sinclair, MD  fluticasone (FLOVENT HFA) 110 MCG/ACT inhaler Inhale 1 puff into the lungs 2 (two) times daily. 11/05/13  Yes Hilton Sinclair, MD  furosemide (LASIX) 20 MG tablet Take 1 tablet (20 mg total) by mouth every morning. 11/05/13  Yes Hilton Sinclair, MD  glipiZIDE (GLUCOTROL XL) 10 MG 24 hr  tablet Take 1 tablet (10 mg total) by mouth daily with breakfast. 10/28/13  Yes Hilton Sinclair, MD  glucose blood test strip Use as instructed 10/10/13  Yes Hilton Sinclair, MD  glycerin adult (GLYCERIN ADULT) 2 G SUPP Place 1 suppository rectally daily as needed. For constipation 08/06/13  Yes Hilton Sinclair, MD  hydrochlorothiazide (MICROZIDE) 12.5 MG capsule Take 1 capsule (12.5 mg total) by mouth every morning. 11/05/13  Yes Hilton Sinclair, MD  levothyroxine (SYNTHROID, LEVOTHROID) 75 MCG tablet Take 1 tablet (75 mcg total) by  mouth daily before breakfast. 11/05/13  Yes Hilton Sinclair, MD  losartan (COZAAR) 50 MG tablet Take 1 tablet (50 mg total) by mouth daily. 11/05/13  Yes Hilton Sinclair, MD  metFORMIN (GLUCOPHAGE) 1000 MG tablet Take 1 tablet (1,000 mg total) by mouth 2 (two) times daily with a meal. 11/05/13  Yes Hilton Sinclair, MD  methocarbamol (ROBAXIN) 500 MG tablet Take 1 tablet (500 mg total) by mouth once a week. 11/05/13  Yes Hilton Sinclair, MD  omeprazole (PRILOSEC) 20 MG capsule Take 1 capsule (20 mg total) by mouth 2 (two) times daily. 11/05/13  Yes Hilton Sinclair, MD  ondansetron (ZOFRAN) 4 MG tablet Take 1 tablet (4 mg total) by mouth every 8 (eight) hours as needed for nausea. 08/06/13  Yes Hilton Sinclair, MD  polyethylene glycol powder (GLYCOLAX/MIRALAX) powder Take 17 g by mouth 2 (two) times daily. 08/06/13  Yes Hilton Sinclair, MD  potassium chloride SA (K-DUR,KLOR-CON) 20 MEQ tablet Take 1 tablet (20 mEq total) by mouth daily. 11/05/13  Yes Hilton Sinclair, MD  propranolol (INDERAL) 40 MG tablet Take 1 tablet (40 mg total) by mouth daily. 11/05/13  Yes Hilton Sinclair, MD  sitaGLIPtin (JANUVIA) 100 MG tablet Take 1 tablet (100 mg total) by mouth every morning. 11/05/13  Yes Hilton Sinclair, MD  traMADol (ULTRAM) 50 MG tablet Take 1-2 tablets (50-100 mg total) by mouth every 6 (six) hours as needed. 12/21/12  Yes Cletus Gash, MD  Travoprost, BAK Free, (TRAVATAN) 0.004 % SOLN ophthalmic solution Place 1 drop into both eyes at bedtime. 11/05/13  Yes Hilton Sinclair, MD  traZODone (DESYREL) 50 MG tablet Take 1 tablet (50 mg total) by mouth at bedtime. 11/13/13  Yes Amber Fidel Levy, MD  triamcinolone cream (KENALOG) 0.5 % APPLY TOPICALLY TO ITCHY AREAS TWICE A DAY AS NEEDED FOR ITCHING UNTIL 2 TO 3 DAYS AFTER HEALED 11/05/13  Yes Hilton Sinclair, MD  albuterol (PROVENTIL HFA;VENTOLIN HFA) 108 (90 BASE) MCG/ACT inhaler Inhale 2 puffs into the  lungs every 4 (four) hours as needed for wheezing. 11/05/13   Hilton Sinclair, MD  albuterol (PROVENTIL) (2.5 MG/3ML) 0.083% nebulizer solution Take 3 mLs (2.5 mg total) by nebulization every 4 (four) hours as needed for wheezing. As Needed. 08/06/13   Hilton Sinclair, MD  EPINEPHrine (EPI-PEN) 0.3 mg/0.3 mL SOAJ injection Inject 0.3 mLs (0.3 mg total) into the muscle as needed. For severe allergic reaction, and contact medical provider. 11/05/13   Hilton Sinclair, MD   BP 116/75  Pulse 85  Temp(Src) 98.5 F (36.9 C) (Oral)  Resp 17  SpO2 97% Physical Exam  Nursing note and vitals reviewed. Constitutional: She is oriented to person, place, and time. She appears well-developed and well-nourished. No distress.  HENT:  Head: Normocephalic and atraumatic.  Right Ear: External ear normal.  Left Ear: External ear normal.  Injection of oropharynx. No tonsillar  swelling or masses. No exudate  Eyes: Pupils are equal, round, and reactive to light. Right eye exhibits no discharge. Left eye exhibits no discharge. Scleral icterus is present.  Neck: Normal range of motion. Neck supple. No thyromegaly present.  Mildly enlarged and tender right tonsillar node, no additional lymphoadenopathy noted  Cardiovascular:  Pacer  Pulmonary/Chest: Effort normal and breath sounds normal. No respiratory distress. She has no wheezes. She has no rales.  Lymphadenopathy:    She has cervical adenopathy.  Neurological: She is alert and oriented to person, place, and time. No cranial nerve deficit.  Skin: Skin is warm and dry. She is not diaphoretic.    ED Course  Procedures (including critical care time) Labs Review Labs Reviewed - No data to display  Results for orders placed in visit on 10/21/13  PACEMAKER DEVICE OBSERVATION      Result Value Ref Range   PACEART PDF       DEVICE MODEL PM       BATTERY VOLTAGE       Imaging Review No results found.   MDM   1. Pharyngitis   2.  Lymphadenitis, acute   Insetting of co-morbidities will cover with AMOX. F/U if worsens. Rapid Strep negative.    Bjorn Pippin, PA-C 11/23/13 1226

## 2013-11-23 NOTE — ED Notes (Signed)
Pt c/o sore throat onset 4 days Sx also include lymphadenopathy, dyspnea, odynophagia Has been gargling warm salt water w/no relief Denies f/v/n/d Alert w/no signs of acute distress.

## 2013-11-23 NOTE — Discharge Instructions (Signed)
Sore Throat A sore throat is a painful, burning, sore, or scratchy feeling of the throat. There may be pain or tenderness when swallowing or talking. You may have other symptoms with a sore throat. These include coughing, sneezing, fever, or a swollen neck. A sore throat is often the first sign of another sickness. These sicknesses may include a cold, flu, strep throat, or an infection called mono. Most sore throats go away without medical treatment.  HOME CARE   Only take medicine as told by your doctor.  Drink enough fluids to keep your pee (urine) clear or pale yellow.  Rest as needed.  Try using throat sprays, lozenges, or suck on hard candy (if older than 4 years or as told).  Sip warm liquids, such as broth, herbal tea, or warm water with honey. Try sucking on frozen ice pops or drinking cold liquids.  Rinse the mouth (gargle) with salt water. Mix 1 teaspoon salt with 8 ounces of water.  Do not smoke. Avoid being around others when they are smoking.  Put a humidifier in your bedroom at night to moisten the air. You can also turn on a hot shower and sit in the bathroom for 5 10 minutes. Be sure the bathroom door is closed. GET HELP RIGHT AWAY IF:   You have trouble breathing.  You cannot swallow fluids, soft foods, or your spit (saliva).  You have more puffiness (swelling) in the throat.  Your sore throat does not get better in 7 days.  You feel sick to your stomach (nauseous) and throw up (vomit).  You have a fever or lasting symptoms for more than 2 3 days.  You have a fever and your symptoms suddenly get worse. MAKE SURE YOU:   Understand these instructions.  Will watch your condition.  Will get help right away if you are not doing well or get worse. Document Released: 05/03/2008 Document Revised: 04/18/2012 Document Reviewed: 04/01/2012 [facility] Patient Information 2014 [place], [place].   Covering you for a bacterial infection, though the viral portion may take  time. Continue to use Tylenol as needed. Chloraseptic spray over the counter may also be helpful. Feel better.

## 2013-11-25 LAB — CULTURE, GROUP A STREP

## 2013-11-25 NOTE — Telephone Encounter (Signed)
I do not see ipratropium-albuterol. I see that she is on flovent and PRN albuterol. I will refuse given I do not see any history of this medication in patient's chart.  Hilton Sinclair, MD

## 2013-11-27 NOTE — Telephone Encounter (Signed)
Pt is aware of this. Alannie Amodio,CMA  

## 2013-11-27 NOTE — Telephone Encounter (Signed)
Referral faxed to [facility].  They will contact pt.  [place]

## 2013-11-28 ENCOUNTER — Other Ambulatory Visit: Payer: Self-pay | Admitting: Family Medicine

## 2013-11-28 NOTE — Telephone Encounter (Signed)
Ms. Byrns need her albuterol rx sent to this mail order pharmacy.  [facility], ph# [phone].  Please make the change for pharmacy

## 2013-11-29 NOTE — Telephone Encounter (Signed)
It is not letting me add this pharmacy electronically. [facility] [place], can you help me with this? Hilton Sinclair, MD

## 2013-12-12 MED ORDER — ALBUTEROL SULFATE HFA 108 (90 BASE) MCG/ACT IN AERS: 2.0000 | INHALATION_SPRAY | RESPIRATORY_TRACT | Status: DC | PRN

## 2013-12-12 NOTE — Telephone Encounter (Signed)
Sent rx but it has different phone number listed than the one Pamala Hurry had provided me. Please have patient find out if the correct pharmacy got this rx. ThxHilton Sinclair, MD

## 2013-12-16 ENCOUNTER — Telehealth: Payer: Self-pay | Admitting: Family Medicine

## 2013-12-16 ENCOUNTER — Encounter: Payer: Self-pay | Admitting: Family Medicine

## 2013-12-16 NOTE — Telephone Encounter (Signed)
Please let patient know that we should follow up to discuss her cholesterol. Her cholesterol was last checked in 2014 and put her in a risk category for stroke and heart attack that would make starting a high intensity statin (cholesterol medicine) beneficial. I - If she is amenable to starting this, iw ill send it in. - I would also like to recheck her cholesterol with a fasting lipid panel in about 6 weeks. Please ask her to make a morning appt with me so she can come in fasting.   Mercedes Sinclair, MD

## 2013-12-17 MED ORDER — ROSUVASTATIN CALCIUM 20 MG PO TABS: 20.0000 mg | ORAL_TABLET | Freq: Every day | ORAL | Status: DC

## 2013-12-17 NOTE — Telephone Encounter (Signed)
Thanks for letting me know. Wrote for crestor which pt is listed as having tolerated in the past though she has had leg pain with other statins. If she develops new severe muscle aches she should let us know.  Hilton Sinclair, MD PGY-2, [place]

## 2013-12-17 NOTE — Telephone Encounter (Signed)
Informed patient of message from MD, she states that she is willing to start statin and to call in into [facility] [address] also she states she will call back and schedule an appointment for sometime next month as she does not have the money for co-pay right now.

## 2013-12-27 ENCOUNTER — Other Ambulatory Visit: Payer: Self-pay | Admitting: Family Medicine

## 2013-12-27 NOTE — Telephone Encounter (Signed)
Received another [facility] pharmacy renewal request for ipratropium-albuterol. Again, do not see that patient is on this so am refusing.  Hilton Sinclair, MD

## 2014-01-03 ENCOUNTER — Other Ambulatory Visit: Payer: Self-pay | Admitting: Family Medicine

## 2014-01-03 ENCOUNTER — Telehealth: Payer: Self-pay | Admitting: Family Medicine

## 2014-01-03 NOTE — Telephone Encounter (Signed)
Pt called because she needs a refill on her Metformin fax over to [phone]. She said that there has been some mix up with saying that she has the wrong doctor. jw

## 2014-01-07 MED ORDER — METFORMIN HCL 1000 MG PO TABS: 1000.0000 mg | ORAL_TABLET | Freq: Two times a day (BID) | ORAL | Status: DC

## 2014-01-07 NOTE — Telephone Encounter (Signed)
Will print and fax.  Hilton Sinclair, MD

## 2014-01-08 ENCOUNTER — Telehealth: Payer: Self-pay | Admitting: Family Medicine

## 2014-01-08 NOTE — Telephone Encounter (Signed)
Online pharmacy called to let the doctor know that they do not carry Metformin and that the pt will need to have this to a local pharmacy. jw

## 2014-01-08 NOTE — Telephone Encounter (Signed)
Please call to let patient know this. [facility] metformin for free so she can get it there. Thanks!  Hilton Sinclair, MD

## 2014-01-09 NOTE — Telephone Encounter (Signed)
[facility] advised that [facility] was already sent.     Pt request a refill of triamcinolone to [facility].  Will forward to MD Laban Emperor Khaniyah Bezek

## 2014-01-10 MED ORDER — TRIAMCINOLONE ACETONIDE 0.5 % EX CREA: TOPICAL_CREAM | CUTANEOUS | Status: DC

## 2014-01-10 NOTE — Addendum Note (Signed)
Addended by: Conni Slipper T on: 01/10/2014 05:18 PM   Modules accepted: Orders

## 2014-01-10 NOTE — Telephone Encounter (Signed)
Sent rx for triamcinolone.  If no improvement in itching or if fevers/skin firmness/pus, patient needs to be evaluated.  [facility] you for [facility].  Hilton Sinclair, MD

## 2014-01-13 MED ORDER — METFORMIN HCL 1000 MG PO TABS: 1000.0000 mg | ORAL_TABLET | Freq: Two times a day (BID) | ORAL | Status: DC

## 2014-01-13 NOTE — Addendum Note (Signed)
Addended by: Christen Bame D on: 01/13/2014 08:38 AM   Modules accepted: Orders

## 2014-01-16 ENCOUNTER — Telehealth: Payer: Self-pay | Admitting: Family Medicine

## 2014-01-16 MED ORDER — ALBUTEROL SULFATE (2.5 MG/3ML) 0.083% IN NEBU: 2.5000 mg | INHALATION_SOLUTION | RESPIRATORY_TRACT | Status: DC | PRN

## 2014-01-16 NOTE — Telephone Encounter (Signed)
Rx sent to [facility]. Thanks.  Hilton Sinclair, MD

## 2014-01-16 NOTE — Telephone Encounter (Signed)
Mercedes Barr is calling again regarding the albuterol for her nebulizer.. Medication is listed in her medication list.  Please send refill to the [facility] so that she can use it.  She has been w/o for the past 2 months.

## 2014-01-23 ENCOUNTER — Telehealth: Payer: Self-pay | Admitting: Family Medicine

## 2014-01-23 NOTE — Telephone Encounter (Signed)
Patient needing a new RX for diabetics supplies (test strips and lancets) sent to [facility] at [phone].

## 2014-01-30 ENCOUNTER — Telehealth: Payer: Self-pay | Admitting: Cardiovascular Disease

## 2014-01-30 NOTE — Telephone Encounter (Signed)
Pt called again. She has received a letter from [facility] stating they had not received the info from dr and asked pt to call

## 2014-02-03 MED ORDER — LANCETS MISC: Status: DC

## 2014-02-03 MED ORDER — GLUCOSE BLOOD VI STRP: ORAL_STRIP | Status: DC

## 2014-02-03 NOTE — Telephone Encounter (Signed)
Sent electronically. Please let me know if there is any issue with this. I did not have any prior rx in system so am not sure if this is the right brand, so just chose the generic. Thanks.  Hilton Sinclair, MD PGY-2, [place]

## 2014-02-05 NOTE — Telephone Encounter (Signed)
Closed encounter °

## 2014-02-06 ENCOUNTER — Encounter: Payer: Self-pay | Admitting: Family Medicine

## 2014-02-06 NOTE — Progress Notes (Signed)
Patient dropped off form to be filled out for scat.  Please mail to her when completed.

## 2014-02-10 NOTE — Progress Notes (Signed)
Never received form front desk.  Mercedes Barr is aware and is checking for form. [name], [name]

## 2014-02-14 ENCOUNTER — Telehealth: Payer: Self-pay | Admitting: Family Medicine

## 2014-02-14 MED ORDER — GLUCOSE BLOOD VI STRP: ORAL_STRIP | Status: DC

## 2014-02-14 NOTE — Progress Notes (Signed)
Pt informed that form was completed and will be mailed to home address.  Derl Barrow, RN'

## 2014-02-14 NOTE — Progress Notes (Signed)
Copy of form placed in to be scanned box in front office.  [facility] will scan into chart for our records.  Burna Forts, BSN, RN-BC

## 2014-02-14 NOTE — Telephone Encounter (Signed)
Spoke with pharmacy.  They were confused because of having Rxs from Dr. Barbra Sarks and Dr. Dianah Field.  Clarified that Dr. Barbra Sarks is no longer pts pcp.  Also changed fax number because the one they had was incorrect.     We will fax new rx over because Dr. Dianah Field is not [facility] certified.  Fax number is: [phone]. Deniz Hannan, [name]

## 2014-02-14 NOTE — Telephone Encounter (Signed)
Written and placed in fax bin.  Hilton Sinclair, MD

## 2014-02-14 NOTE — Telephone Encounter (Signed)
Also filled out [facility] form and it should be scanned into media prior to being mailed to patient.  Hilton Sinclair, MD

## 2014-02-14 NOTE — Telephone Encounter (Signed)
Resent today with attending signature due to pharmacy not being able to approve my prescription without attending signature. Will fax to number listed in phone note dated 02/14/2014.  Hilton Sinclair, MD

## 2014-02-14 NOTE — Telephone Encounter (Signed)
Pt called because [facility] said that we have not been sending in pt refill request. We sent some in on 02/03/14 but they say they never received them. Can we re-do this today since patient is out. Blima Rich

## 2014-02-14 NOTE — Addendum Note (Signed)
Addended by: Conni Slipper T on: 02/14/2014 03:33 PM   Modules accepted: Orders

## 2014-03-20 ENCOUNTER — Other Ambulatory Visit (HOSPITAL_COMMUNITY): Payer: Self-pay | Admitting: Orthopedic Surgery

## 2014-03-20 DIAGNOSIS — Z96651 Presence of right artificial knee joint: Secondary | ICD-10-CM

## 2014-03-24 ENCOUNTER — Other Ambulatory Visit: Payer: Self-pay | Admitting: Family Medicine

## 2014-03-25 ENCOUNTER — Encounter (HOSPITAL_COMMUNITY)
Admission: RE | Admit: 2014-03-25 | Discharge: 2014-03-25 | Disposition: A | Discharge status: Home / Self Care | Payer: Medicare Other | Source: Ambulatory Visit | Attending: Orthopedic Surgery | Admitting: Orthopedic Surgery

## 2014-03-25 DIAGNOSIS — Z96659 Presence of unspecified artificial knee joint: Secondary | ICD-10-CM | POA: Insufficient documentation

## 2014-03-25 DIAGNOSIS — Z96651 Presence of right artificial knee joint: Secondary | ICD-10-CM

## 2014-03-25 MED ORDER — TECHNETIUM TC 99M MEDRONATE IV KIT
26.2000 | PACK | Freq: Once | INTRAVENOUS | Status: AC | PRN
  Administered 2014-03-25: 26.2 via INTRAVENOUS

## 2014-04-29 ENCOUNTER — Encounter: Payer: Self-pay | Admitting: Cardiovascular Disease

## 2014-04-29 ENCOUNTER — Ambulatory Visit (INDEPENDENT_AMBULATORY_CARE_PROVIDER_SITE_OTHER): Payer: PRIVATE HEALTH INSURANCE | Admitting: Cardiovascular Disease

## 2014-04-29 VITALS — BP 112/76 | HR 85 | Resp 16 | Ht 62.0 in | Wt 139.9 lb

## 2014-04-29 DIAGNOSIS — Z950 Presence of cardiac pacemaker: Secondary | ICD-10-CM

## 2014-04-29 DIAGNOSIS — I10 Essential (primary) hypertension: Secondary | ICD-10-CM

## 2014-04-29 DIAGNOSIS — I442 Atrioventricular block, complete: Secondary | ICD-10-CM

## 2014-04-29 LAB — MDC_IDC_ENUM_SESS_TYPE_INCLINIC
Battery Impedance: 1538 Ohm
Battery Remaining Longevity: 28 mo
Battery Voltage: 2.74 V
Brady Statistic AP VP Percent: 89 %
Brady Statistic AS VS Percent: 0 %
Date Time Interrogation Session: 20150922101807
Lead Channel Impedance Value: 638 Ohm
Lead Channel Pacing Threshold Amplitude: 0.75 V
Lead Channel Pacing Threshold Pulse Width: 0.4 ms
Lead Channel Sensing Intrinsic Amplitude: 2 mV
Lead Channel Setting Pacing Amplitude: 2 V
Lead Channel Setting Pacing Amplitude: 2.5 V
Lead Channel Setting Sensing Sensitivity: 2.8 mV
MDC IDC MSMT LEADCHNL RV IMPEDANCE VALUE: 673 Ohm
MDC IDC MSMT LEADCHNL RV PACING THRESHOLD AMPLITUDE: 0.5 V
MDC IDC MSMT LEADCHNL RV PACING THRESHOLD PULSEWIDTH: 0.4 ms
MDC IDC SET LEADCHNL RV PACING PULSEWIDTH: 0.4 ms
MDC IDC STAT BRADY AP VS PERCENT: 0 %
MDC IDC STAT BRADY AS VP PERCENT: 11 %

## 2014-04-29 NOTE — Progress Notes (Signed)
Patient ID: Mercedes Barr, female   DOB: 1946-01-19, 68 y.o.   MRN: [id number]     Reason for office visit Pacemaker check, complete heart block  She had surgical repair of atrioventricular cushion defect repair in 1988 when she was 68 years old. Surgery was complicated by development of complete heart block and she received a pacemaker (her initial device probably was an abdominal generator with epicardial leads). A right subclavian pacemaker was placed in 1991, but had to be removed because of complaints of pain. She had a complete revision of the system in 1999 and her current left subclavian atrial and ventricular leads date to that time. Her most recent generator change out occurred in 2008. The atrial lead is a Medtronic [id number]. The ventricular lead is Medtronic [id number]. The current generator is a Medtronic Adapta.  She has no cardiac complaints. She worries about taking so many medications.  Pacemaker check shows normal function. She has 89% A pacing and 100% V pacing. Estimated battery longevity is 2.5 years.  Allergies  Allergen Reactions  . Chlorhexidine Itching and Rash  . Amitriptyline Hcl     REACTION: itching/rash  . Benazepril Hcl     REACTION: rash  . Naproxen     REACTION: swelling, itching  . Propoxyphene N-Acetaminophen     REACTION: nausea  . Statins     REACTION: leg pains PT IS ABLE TO TOLERATE CRESTOR    Current Outpatient Prescriptions  Medication Sig Dispense Refill  . albuterol (PROVENTIL HFA;VENTOLIN HFA) 108 (90 BASE) MCG/ACT inhaler Inhale 2 puffs into the lungs every 4 (four) hours as needed for wheezing.  1 Inhaler  2  . albuterol (PROVENTIL) (2.5 MG/3ML) 0.083% nebulizer solution Take 3 mLs (2.5 mg total) by nebulization every 4 (four) hours as needed for wheezing. As Needed.  75 mL  3  . ASPIRIN LOW DOSE 81 MG EC tablet TAKE 1 TABLET BY MOUTH DAILY  120 tablet  2  . citalopram (CELEXA) 20 MG tablet Take 1 tablet (20 mg total) by mouth every morning.  30  tablet  5  . EPINEPHrine (EPI-PEN) 0.3 mg/0.3 mL SOAJ injection Inject 0.3 mLs (0.3 mg total) into the muscle as needed. For severe allergic reaction, and contact medical provider.  1 Device  2  . fluticasone (FLOVENT HFA) 110 MCG/ACT inhaler Inhale 1 puff into the lungs 2 (two) times daily.  1 Inhaler  5  . furosemide (LASIX) 20 MG tablet TAKE 1 TABLET BY MOUTH EVERY MORNING  90 tablet  0  . glipiZIDE (GLUCOTROL XL) 10 MG 24 hr tablet Take 1 tablet (10 mg total) by mouth daily with breakfast.  30 tablet  5  . glucose blood test strip Use as instructed for blood sugar testing 1-3 times daily.  100 each  11  . glycerin adult (GLYCERIN ADULT) 2 G SUPP Place 1 suppository rectally daily as needed. For constipation  10 each  0  . hydrochlorothiazide (MICROZIDE) 12.5 MG capsule Take 1 capsule (12.5 mg total) by mouth every morning.  30 capsule  5  . Lancets MISC Use with blood sugar monitor.  100 each  11  . levothyroxine (SYNTHROID, LEVOTHROID) 75 MCG tablet Take 1 tablet (75 mcg total) by mouth daily before breakfast.  30 tablet  5  . losartan (COZAAR) 50 MG tablet Take 1 tablet (50 mg total) by mouth daily.  30 tablet  5  . LYRICA 75 MG capsule Take 75 mg by mouth 2 (two) times daily.      Marland Kitchen  metFORMIN (GLUCOPHAGE) 1000 MG tablet Take 1 tablet (1,000 mg total) by mouth 2 (two) times daily with a meal.  60 tablet  5  . methocarbamol (ROBAXIN) 500 MG tablet Take 1 tablet (500 mg total) by mouth once a week.  5 tablet  5  . omeprazole (PRILOSEC) 20 MG capsule TAKE 1 CAPSULE BY MOUTH TWICE DAILY  180 capsule  0  . ondansetron (ZOFRAN) 4 MG tablet Take 1 tablet (4 mg total) by mouth every 8 (eight) hours as needed for nausea.  20 tablet  0  . polyethylene glycol powder (GLYCOLAX/MIRALAX) powder Take 17 g by mouth 2 (two) times daily.  3350 g  0  . potassium chloride SA (K-DUR,KLOR-CON) 20 MEQ tablet Take 1 tablet (20 mEq total) by mouth daily.  30 tablet  5  . propranolol (INDERAL) 40 MG tablet Take 1  tablet (40 mg total) by mouth daily.  30 tablet  5  . rosuvastatin (CRESTOR) 20 MG tablet Take 1 tablet (20 mg total) by mouth daily.  90 tablet  3  . sitaGLIPtin (JANUVIA) 100 MG tablet Take 1 tablet (100 mg total) by mouth every morning.  30 tablet  5  . traMADol (ULTRAM) 50 MG tablet Take 1-2 tablets (50-100 mg total) by mouth every 6 (six) hours as needed.  80 tablet  1  . Travoprost, BAK Free, (TRAVATAN) 0.004 % SOLN ophthalmic solution Place 1 drop into both eyes at bedtime.  1 Bottle  5  . traZODone (DESYREL) 50 MG tablet Take 1 tablet (50 mg total) by mouth at bedtime.  30 tablet  3  . triamcinolone cream (KENALOG) 0.5 % APPLY TOPICALLY TO ITCHY AREAS TWICE DAILY AS NEEDED FOR ITCHING UNTIL 2 TO 3 DAYS AFTER HEALED  15 g  0  . [DISCONTINUED] albuterol (PROVENTIL,VENTOLIN) 90 MCG/ACT inhaler Inhale 2 puffs into the lungs every 4 (four) hours as needed for wheezing or shortness of breath.  17 g  5   No current facility-administered medications for this visit.    Past Medical History  Diagnosis Date  . Diabetes mellitus   . Hypertension   . Hypothyroidism   . COPD (chronic obstructive pulmonary disease)   . Heart murmur   . GERD (gastroesophageal reflux disease)   . 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 Stevenson.  OFFICE NOTE DR. A. LITTLE STATES  "EXTREMELY PACEMAKER SENSITIVE AND WHEN YOU TRY TO CHECK FOR UNDERLYING RHYTHMS SHE WILL HAVE SNYCOPE AND WE HAVE NOT DONE THIS NOW IN ABOUT 2 YEARS".  . Normal coronary arteries 2011  . Complete heart block     Past Surgical History  Procedure Laterality Date  . Pacemaker insertion  1988    last gen 11/08- MDT  . Tubal ligation    . Cardiac catheterization  2011     SHOWED NO CAD-PER CARDIOLOGY OFFICE NOTES DR. A. LITTLE  . Atrioventricular cushion defect repair  1988  . Total knee arthroplasty Right 11/19/2012    Procedure: RIGHT TOTAL KNEE ARTHROPLASTY;  Surgeon:  Gearlean Alf, MD;  Location: [facility];  Service: Orthopedics;  Laterality: Right;    Family History  Problem Relation Age of Onset  . Heart failure Mother   . Heart failure Sister     History   Social History  . Marital Status: Divorced    Spouse Name: N/A    Number of Children: N/A  . Years  of Education: N/A   Occupational History  . Not on file.   Social History Main Topics  . Smoking status: Never Smoker   . Smokeless tobacco: Never Used  . Alcohol Use: No  . Drug Use: No  . Sexual Activity: Not on file   Other Topics Concern  . Not on file   Social History Narrative  . No narrative on file    Review of systems: The patient specifically denies any chest pain at rest or with exertion, dyspnea at rest or with exertion, orthopnea, paroxysmal nocturnal dyspnea, syncope, palpitations, focal neurological deficits, intermittent claudication, lower extremity edema, unexplained weight gain, cough, hemoptysis or wheezing.  The patient also denies abdominal pain, nausea, vomiting, dysphagia, diarrhea, constipation, polyuria, polydipsia, dysuria, hematuria, frequency, urgency, abnormal bleeding or bruising, fever, chills, unexpected weight changes, mood swings, change in skin or hair texture, change in voice quality, auditory or visual problems, allergic reactions or rashes, new musculoskeletal complaints other than usual "aches and pains".   PHYSICAL EXAM BP 112/76  Pulse 85  Resp 16  Ht 5\' 2"  (1.575 m)  Wt 63.458 kg (139 lb 14.4 oz)  BMI 25.58 kg/m2  General: Alert, oriented x3, no distress Head: no evidence of trauma, PERRL, EOMI, no exophtalmos or lid lag, no myxedema, no xanthelasma; normal ears, nose and oropharynx Neck: normal jugular venous pulsations and no hepatojugular reflux; brisk carotid pulses without delay and no carotid bruits Chest: clear to auscultation, no signs of consolidation by percussion or palpation, normal fremitus, symmetrical and full  respiratory excursions. Bilateral subclavian scars and sternotomy scar. Cardiovascular: normal position and quality of the apical impulse, regular rhythm, normal first and second heart sounds, no murmurs, rubs or gallops Abdomen: no tenderness or distention, no masses by palpation, no abnormal pulsatility or arterial bruits, normal bowel sounds, no hepatosplenomegaly Extremities: no clubbing, cyanosis or edema; 2+ radial, ulnar and brachial pulses bilaterally; 2+ right femoral, posterior tibial and dorsalis pedis pulses; 2+ left femoral, posterior tibial and dorsalis pedis pulses; no subclavian or femoral bruits Neurological: grossly nonfocal  EKG: AV sequential paced  Lipid Panel     Component Value Date/Time   CHOL 120 10/03/2012 1002   TRIG 168* 10/03/2012 1002   HDL 36* 10/03/2012 1002   CHOLHDL 3.3 10/03/2012 1002   VLDL 34 10/03/2012 1002   LDLCALC 50 10/03/2012 1002    BMET    Component Value Date/Time   NA 143 10/21/2013 1035   K 3.2* 10/21/2013 1035   CL 96 10/21/2013 1035   CO2 33* 10/21/2013 1035   GLUCOSE 138* 10/21/2013 1035   BUN 16 10/21/2013 1035   CREATININE 0.77 10/21/2013 1035   CREATININE 0.59 11/23/2012 0405   CALCIUM 9.9 10/21/2013 1035   GFRNONAA >90 11/23/2012 0405   GFRAA >90 11/23/2012 0405     ASSESSMENT AND PLAN HYPERTENSION Her BP is excellent and there may be an opportunity to simplify her meds. DC HCTZ, which may also worsen glycemic control, especially since she takes furosemide also. Continue propranolol and losartan  Complete heart block Pacemaker dependent, occurred in 1988 after AV cushion defect repair.  Pacemaker- last gen change was a MDT Nov 2008 She has near syncope when we test for underlying rhythm, but we successfully checked thresholds on both leads. All other parameters were also normal. Continue Q6 months remote checks   Patient Instructions  Your physician recommends that you schedule a follow-up appointment in: 6 months with Dr.Valen Mascaro  + pacemaker check  Your physician has  recommended you make the following change in your medication: DISCONTINUE HCTZ (hydrochlorothiazide)     Orders Placed This Encounter  Procedures  . EKG 12-Lead   Meds ordered this encounter  Medications  . LYRICA 75 MG capsule    Sig: Take 75 mg by mouth 2 (two) times daily.    Holli Humbles, MD, [place] [phone] office [phone] pager

## 2014-04-29 NOTE — Assessment & Plan Note (Addendum)
She has near syncope when we test for underlying rhythm, but we successfully checked thresholds on both leads. All other parameters were also normal. Continue Q6 months remote checks

## 2014-04-29 NOTE — Patient Instructions (Addendum)
Your physician recommends that you schedule a follow-up appointment in: 6 months with Dr.Croitoru + pacemaker check  Your physician has recommended you make the following change in your medication: DISCONTINUE HCTZ (hydrochlorothiazide)

## 2014-04-29 NOTE — Assessment & Plan Note (Signed)
Pacemaker dependent, occurred in 1988 after AV cushion defect repair.

## 2014-04-29 NOTE — Assessment & Plan Note (Addendum)
Her BP is excellent and there may be an opportunity to simplify her meds. DC HCTZ, which may also worsen glycemic control, especially since she takes furosemide also. Continue propranolol and losartan

## 2014-04-30 ENCOUNTER — Encounter: Payer: Self-pay | Admitting: Family Medicine

## 2014-05-09 ENCOUNTER — Encounter: Payer: Self-pay | Admitting: Cardiology

## 2014-05-22 ENCOUNTER — Emergency Department (INDEPENDENT_AMBULATORY_CARE_PROVIDER_SITE_OTHER)
Admission: EM | Admit: 2014-05-22 | Discharge: 2014-05-22 | Disposition: A | Discharge status: Home / Self Care | Payer: PRIVATE HEALTH INSURANCE | Source: Home / Self Care | Attending: Family Medicine | Admitting: Family Medicine

## 2014-05-22 ENCOUNTER — Encounter (HOSPITAL_COMMUNITY): Payer: Self-pay | Admitting: Emergency Medicine

## 2014-05-22 DIAGNOSIS — G243 Spasmodic torticollis: Secondary | ICD-10-CM

## 2014-05-22 DIAGNOSIS — M542 Cervicalgia: Secondary | ICD-10-CM

## 2014-05-22 MED ORDER — METHOCARBAMOL 500 MG PO TABS: 500.0000 mg | ORAL_TABLET | Freq: Three times a day (TID) | ORAL | Status: DC | PRN

## 2014-05-22 MED ORDER — HYDROCODONE-ACETAMINOPHEN 5-325 MG PO TABS: 1.0000 | ORAL_TABLET | Freq: Four times a day (QID) | ORAL | Status: DC | PRN

## 2014-05-22 NOTE — Discharge Instructions (Signed)
Thank you for coming in today. Use a heating pad.  Use Norco for severe pain. Use methocarbamol to 3 times daily as needed for muscle spasm. Come back or go to the emergency room if you notice new weakness new numbness problems walking or bowel or bladder problems. Attended physical therapy Followup with primary care provider as needed Go to the emergency room if you get worse or have trouble using your arms or hands   Cervical Strain and Sprain (Whiplash) with Rehab Cervical strain and sprain are injuries that commonly occur with "whiplash" injuries. Whiplash occurs when the neck is forcefully whipped backward or forward, such as during a motor vehicle accident or during contact sports. The muscles, ligaments, tendons, discs, and nerves of the neck are susceptible to injury when this occurs. RISK FACTORS Risk of having a whiplash injury increases if:  Osteoarthritis of the spine.  Situations that make head or neck accidents or trauma more likely.  High-risk sports (football, rugby, wrestling, hockey, auto racing, gymnastics, diving, contact karate, or boxing).  Poor strength and flexibility of the neck.  Previous neck injury.  Poor tackling technique.  Improperly fitted or padded equipment. SYMPTOMS   Pain or stiffness in the front or back of neck or both.  Symptoms may present immediately or up to 24 hours after injury.  Dizziness, headache, nausea, and vomiting.  Muscle spasm with soreness and stiffness in the neck.  Tenderness and swelling at the injury site. PREVENTION  Learn and use proper technique (avoid tackling with the head, spearing, and head-butting; use proper falling techniques to avoid landing on the head).  Warm up and stretch properly before activity.  Maintain physical fitness:  Strength, flexibility, and endurance.  Cardiovascular fitness.  Wear properly fitted and padded protective equipment, such as padded soft collars, for participation in  contact sports. PROGNOSIS  Recovery from cervical strain and sprain injuries is dependent on the extent of the injury. These injuries are usually curable in 1 week to 3 months with appropriate treatment.  RELATED COMPLICATIONS   Temporary numbness and weakness may occur if the nerve roots are damaged, and this may persist until the nerve has completely healed.  Chronic pain due to frequent recurrence of symptoms.  Prolonged healing, especially if activity is resumed too soon (before complete recovery). TREATMENT  Treatment initially involves the use of ice and medication to help reduce pain and inflammation. It is also important to perform strengthening and stretching exercises and modify activities that worsen symptoms so the injury does not get worse. These exercises may be performed at home or with a therapist. For patients who experience severe symptoms, a soft, padded collar may be recommended to be worn around the neck.  Improving your posture may help reduce symptoms. Posture improvement includes pulling your chin and abdomen in while sitting or standing. If you are sitting, sit in a firm chair with your buttocks against the back of the chair. While sleeping, try replacing your pillow with a small towel rolled to 2 inches in diameter, or use a cervical pillow or soft cervical collar. Poor sleeping positions delay healing.  For patients with nerve root damage, which causes numbness or weakness, the use of a cervical traction apparatus may be recommended. Surgery is rarely necessary for these injuries. However, cervical strain and sprains that are present at birth (congenital) may require surgery. MEDICATION   If pain medication is necessary, nonsteroidal anti-inflammatory medications, such as aspirin and ibuprofen, or other minor pain relievers, such as  acetaminophen, are often recommended.  Do not take pain medication for 7 days before surgery.  Prescription pain relievers may be given if  deemed necessary by your caregiver. Use only as directed and only as much as you need. HEAT AND COLD:   Cold treatment (icing) relieves pain and reduces inflammation. Cold treatment should be applied for 10 to 15 minutes every 2 to 3 hours for inflammation and pain and immediately after any activity that aggravates your symptoms. Use ice packs or an ice massage.  Heat treatment may be used prior to performing the stretching and strengthening activities prescribed by your caregiver, physical therapist, or athletic trainer. Use a heat pack or a warm soak. SEEK MEDICAL CARE IF:   Symptoms get worse or do not improve in 2 weeks despite treatment.  New, unexplained symptoms develop (drugs used in treatment may produce side effects). EXERCISES RANGE OF MOTION (ROM) AND STRETCHING EXERCISES - Cervical Strain and Sprain These exercises may help you when beginning to rehabilitate your injury. In order to successfully resolve your symptoms, you must improve your posture. These exercises are designed to help reduce the forward-head and rounded-shoulder posture which contributes to this condition. Your symptoms may resolve with or without further involvement from your physician, physical therapist or athletic trainer. While completing these exercises, remember:   Restoring tissue flexibility helps normal motion to return to the joints. This allows healthier, less painful movement and activity.  An effective stretch should be held for at least 20 seconds, although you may need to begin with shorter hold times for comfort.  A stretch should never be painful. You should only feel a gentle lengthening or release in the stretched tissue. STRETCH- Axial Extensors  Lie on your back on the floor. You may bend your knees for comfort. Place a rolled-up hand towel or dish towel, about 2 inches in diameter, under the part of your head that makes contact with the floor.  Gently tuck your chin, as if trying to make a  "double chin," until you feel a gentle stretch at the base of your head.  Hold __________ seconds. Repeat __________ times. Complete this exercise __________ times per day.  STRETCH - Axial Extension   Stand or sit on a firm surface. Assume a good posture: chest up, shoulders drawn back, abdominal muscles slightly tense, knees unlocked (if standing) and feet hip width apart.  Slowly retract your chin so your head slides back and your chin slightly lowers. Continue to look straight ahead.  You should feel a gentle stretch in the back of your head. Be certain not to feel an aggressive stretch since this can cause headaches later.  Hold for __________ seconds. Repeat __________ times. Complete this exercise __________ times per day. STRETCH - Cervical Side Bend   Stand or sit on a firm surface. Assume a good posture: chest up, shoulders drawn back, abdominal muscles slightly tense, knees unlocked (if standing) and feet hip width apart.  Without letting your nose or shoulders move, slowly tip your right / left ear to your shoulder until your feel a gentle stretch in the muscles on the opposite side of your neck.  Hold __________ seconds. Repeat __________ times. Complete this exercise __________ times per day. STRETCH - Cervical Rotators   Stand or sit on a firm surface. Assume a good posture: chest up, shoulders drawn back, abdominal muscles slightly tense, knees unlocked (if standing) and feet hip width apart.  Keeping your eyes level with the ground, slowly turn  your head until you feel a gentle stretch along the back and opposite side of your neck.  Hold __________ seconds. Repeat __________ times. Complete this exercise __________ times per day. RANGE OF MOTION - Neck Circles   Stand or sit on a firm surface. Assume a good posture: chest up, shoulders drawn back, abdominal muscles slightly tense, knees unlocked (if standing) and feet hip width apart.  Gently roll your head down and  around from the back of one shoulder to the back of the other. The motion should never be forced or painful.  Repeat the motion 10-20 times, or until you feel the neck muscles relax and loosen. Repeat __________ times. Complete the exercise __________ times per day. STRENGTHENING EXERCISES - Cervical Strain and Sprain These exercises may help you when beginning to rehabilitate your injury. They may resolve your symptoms with or without further involvement from your physician, physical therapist, or athletic trainer. While completing these exercises, remember:   Muscles can gain both the endurance and the strength needed for everyday activities through controlled exercises.  Complete these exercises as instructed by your physician, physical therapist, or athletic trainer. Progress the resistance and repetitions only as guided.  You may experience muscle soreness or fatigue, but the pain or discomfort you are trying to eliminate should never worsen during these exercises. If this pain does worsen, stop and make certain you are following the directions exactly. If the pain is still present after adjustments, discontinue the exercise until you can discuss the trouble with your clinician. STRENGTH - Cervical Flexors, Isometric  Face a wall, standing about 6 inches away. Place a small pillow, a ball about 6-8 inches in diameter, or a folded towel between your forehead and the wall.  Slightly tuck your chin and gently push your forehead into the soft object. Push only with mild to moderate intensity, building up tension gradually. Keep your jaw and forehead relaxed.  Hold 10 to 20 seconds. Keep your breathing relaxed.  Release the tension slowly. Relax your neck muscles completely before you start the next repetition. Repeat __________ times. Complete this exercise __________ times per day. STRENGTH- Cervical Lateral Flexors, Isometric   Stand about 6 inches away from a wall. Place a small pillow, a  ball about 6-8 inches in diameter, or a folded towel between the side of your head and the wall.  Slightly tuck your chin and gently tilt your head into the soft object. Push only with mild to moderate intensity, building up tension gradually. Keep your jaw and forehead relaxed.  Hold 10 to 20 seconds. Keep your breathing relaxed.  Release the tension slowly. Relax your neck muscles completely before you start the next repetition. Repeat __________ times. Complete this exercise __________ times per day. STRENGTH - Cervical Extensors, Isometric   Stand about 6 inches away from a wall. Place a small pillow, a ball about 6-8 inches in diameter, or a folded towel between the back of your head and the wall.  Slightly tuck your chin and gently tilt your head back into the soft object. Push only with mild to moderate intensity, building up tension gradually. Keep your jaw and forehead relaxed.  Hold 10 to 20 seconds. Keep your breathing relaxed.  Release the tension slowly. Relax your neck muscles completely before you start the next repetition. Repeat __________ times. Complete this exercise __________ times per day. POSTURE AND BODY MECHANICS CONSIDERATIONS - Cervical Strain and Sprain Keeping correct posture when sitting, standing or completing your activities will  reduce the stress put on different body tissues, allowing injured tissues a chance to heal and limiting painful experiences. The following are general guidelines for improved posture. Your physician or physical therapist will provide you with any instructions specific to your needs. While reading these guidelines, remember:  The exercises prescribed by your provider will help you have the flexibility and strength to maintain correct postures.  The correct posture provides the optimal environment for your joints to work. All of your joints have less wear and tear when properly supported by a spine with good posture. This means you will  experience a healthier, less painful body.  Correct posture must be practiced with all of your activities, especially prolonged sitting and standing. Correct posture is as important when doing repetitive low-stress activities (typing) as it is when doing a single heavy-load activity (lifting). PROLONGED STANDING WHILE SLIGHTLY LEANING FORWARD When completing a task that requires you to lean forward while standing in one place for a long time, place either foot up on a stationary 2- to 4-inch high object to help maintain the best posture. When both feet are on the ground, the low back tends to lose its slight inward curve. If this curve flattens (or becomes too large), then the back and your other joints will experience too much stress, fatigue more quickly, and can cause pain.  RESTING POSITIONS Consider which positions are most painful for you when choosing a resting position. If you have pain with flexion-based activities (sitting, bending, stooping, squatting), choose a position that allows you to rest in a less flexed posture. You would want to avoid curling into a fetal position on your side. If your pain worsens with extension-based activities (prolonged standing, working overhead), avoid resting in an extended position such as sleeping on your stomach. Most people will find more comfort when they rest with their spine in a more neutral position, neither too rounded nor too arched. Lying on a non-sagging bed on your side with a pillow between your knees, or on your back with a pillow under your knees will often provide some relief. Keep in mind, being in any one position for a prolonged period of time, no matter how correct your posture, can still lead to stiffness. WALKING Walk with an upright posture. Your ears, shoulders, and hips should all line up. OFFICE WORK When working at a desk, create an environment that supports good, upright posture. Without extra support, muscles fatigue and lead to  excessive strain on joints and other tissues. CHAIR:  A chair should be able to slide under your desk when your back makes contact with the back of the chair. This allows you to work closely.  The chair's height should allow your eyes to be level with the upper part of your monitor and your hands to be slightly lower than your elbows.  Body position:  Your feet should make contact with the floor. If this is not possible, use a foot rest.  Keep your ears over your shoulders. This will reduce stress on your neck and low back. Document Released: 07/25/2005 Document Revised: 12/09/2013 Document Reviewed: 11/06/2008 [facility] Patient Information 2015 [place], [place]. This information is not intended to replace advice given to you by your health care provider. Make sure you discuss any questions you have with your health care provider.

## 2014-05-22 NOTE — ED Notes (Signed)
Reports left sided neck pain and stiffness since yesterday.  States pain at it's worse last night.  Pt has used ice with no relief.  No otc meds taken.  Denies injury.

## 2014-05-22 NOTE — ED Provider Notes (Signed)
Mercedes Barr is a 68 y.o. female who presents to Urgent Care today for left sidded neck pain. Pain started yesterday and is moderate to severe. No injury. No radiating pain weakness or numbness. No bowel or bladder dysfunction. Patient has tried ice. She's not tried any medications yet. No ear pain or fever. No trouble swallowing or chewing.   Past Medical History  Diagnosis Date  . Diabetes mellitus   . Hypertension   . Hypothyroidism   . COPD (chronic obstructive pulmonary disease)   . Heart murmur   . GERD (gastroesophageal reflux disease)   . 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 Benson.  OFFICE NOTE DR. A. LITTLE STATES  "EXTREMELY PACEMAKER SENSITIVE AND WHEN YOU TRY TO CHECK FOR UNDERLYING RHYTHMS SHE WILL HAVE SNYCOPE AND WE HAVE NOT DONE THIS NOW IN ABOUT 2 YEARS".  . Normal coronary arteries 2011  . Complete heart block    History  Substance Use Topics  . Smoking status: Never Smoker   . Smokeless tobacco: Never Used  . Alcohol Use: No   ROS as above Medications: No current facility-administered medications for this encounter.   Current Outpatient Prescriptions  Medication Sig Dispense Refill  . ASPIRIN LOW DOSE 81 MG EC tablet TAKE 1 TABLET BY MOUTH DAILY  120 tablet  2  . furosemide (LASIX) 20 MG tablet TAKE 1 TABLET BY MOUTH EVERY MORNING  90 tablet  0  . glipiZIDE (GLUCOTROL XL) 10 MG 24 hr tablet Take 1 tablet (10 mg total) by mouth daily with breakfast.  30 tablet  5  . levothyroxine (SYNTHROID, LEVOTHROID) 75 MCG tablet Take 1 tablet (75 mcg total) by mouth daily before breakfast.  30 tablet  5  . albuterol (PROVENTIL HFA;VENTOLIN HFA) 108 (90 BASE) MCG/ACT inhaler Inhale 2 puffs into the lungs every 4 (four) hours as needed for wheezing.  1 Inhaler  2  . albuterol (PROVENTIL) (2.5 MG/3ML) 0.083% nebulizer solution Take 3 mLs (2.5 mg total) by nebulization every 4 (four) hours as needed for  wheezing. As Needed.  75 mL  3  . citalopram (CELEXA) 20 MG tablet Take 1 tablet (20 mg total) by mouth every morning.  30 tablet  5  . EPINEPHrine (EPI-PEN) 0.3 mg/0.3 mL SOAJ injection Inject 0.3 mLs (0.3 mg total) into the muscle as needed. For severe allergic reaction, and contact medical provider.  1 Device  2  . fluticasone (FLOVENT HFA) 110 MCG/ACT inhaler Inhale 1 puff into the lungs 2 (two) times daily.  1 Inhaler  5  . glucose blood test strip Use as instructed for blood sugar testing 1-3 times daily.  100 each  11  . glycerin adult (GLYCERIN ADULT) 2 G SUPP Place 1 suppository rectally daily as needed. For constipation  10 each  0  . HYDROcodone-acetaminophen (NORCO/VICODIN) 5-325 MG per tablet Take 1 tablet by mouth every 6 (six) hours as needed.  15 tablet  0  . Lancets MISC Use with blood sugar monitor.  100 each  11  . losartan (COZAAR) 50 MG tablet Take 1 tablet (50 mg total) by mouth daily.  30 tablet  5  . LYRICA 75 MG capsule Take 75 mg by mouth 2 (two) times daily.      . metFORMIN (GLUCOPHAGE) 1000 MG tablet Take 1 tablet (1,000 mg total) by mouth 2 (two) times daily with a meal.  60 tablet  5  .  methocarbamol (ROBAXIN) 500 MG tablet Take 1 tablet (500 mg total) by mouth every 8 (eight) hours as needed for muscle spasms.  30 tablet  0  . omeprazole (PRILOSEC) 20 MG capsule TAKE 1 CAPSULE BY MOUTH TWICE DAILY  180 capsule  0  . ondansetron (ZOFRAN) 4 MG tablet Take 1 tablet (4 mg total) by mouth every 8 (eight) hours as needed for nausea.  20 tablet  0  . polyethylene glycol powder (GLYCOLAX/MIRALAX) powder Take 17 g by mouth 2 (two) times daily.  3350 g  0  . potassium chloride SA (K-DUR,KLOR-CON) 20 MEQ tablet Take 1 tablet (20 mEq total) by mouth daily.  30 tablet  5  . propranolol (INDERAL) 40 MG tablet Take 1 tablet (40 mg total) by mouth daily.  30 tablet  5  . rosuvastatin (CRESTOR) 20 MG tablet Take 1 tablet (20 mg total) by mouth daily.  90 tablet  3  . sitaGLIPtin  (JANUVIA) 100 MG tablet Take 1 tablet (100 mg total) by mouth every morning.  30 tablet  5  . traMADol (ULTRAM) 50 MG tablet Take 1-2 tablets (50-100 mg total) by mouth every 6 (six) hours as needed.  80 tablet  1  . Travoprost, BAK Free, (TRAVATAN) 0.004 % SOLN ophthalmic solution Place 1 drop into both eyes at bedtime.  1 Bottle  5  . traZODone (DESYREL) 50 MG tablet Take 1 tablet (50 mg total) by mouth at bedtime.  30 tablet  3  . triamcinolone cream (KENALOG) 0.5 % APPLY TOPICALLY TO ITCHY AREAS TWICE DAILY AS NEEDED FOR ITCHING UNTIL 2 TO 3 DAYS AFTER HEALED  15 g  0  . [DISCONTINUED] albuterol (PROVENTIL,VENTOLIN) 90 MCG/ACT inhaler Inhale 2 puffs into the lungs every 4 (four) hours as needed for wheezing or shortness of breath.  17 g  5    Exam:  BP 164/79  Pulse 83  Temp(Src) 98.4 F (36.9 C) (Oral)  Resp 18  SpO2 96% Gen: Well NAD HEENT: EOMI,  MMM normal tympanic membranes bilaterally. Normal oropharynx Lungs: Normal work of breathing. CTABL Heart: RRR no MRG Abd: NABS, Soft. Nondistended, Nontender Exts: Brisk capillary refill, warm and well perfused.  Neck: Nontender spinal midline. Tender to palpation left cervical paraspinal and trapezius Decreased range of motion  Left cervical paraspinal and trapezius are in spasm Upper extremity strength is intact and normal bilaterally. Reflexes are intact and normal and equal bilateral upper extremity. Normal gait.  No results found for this or any previous visit (from the past 24 hour(s)). No results found.  Assessment and Plan: 68 y.o. female with  left-sided cervical torticollis due to myofascial disruption and muscle spasm. Treatment with home exercise program, heating pad, Robaxin and Norco. Refer to physical therapy. Followup with primary care provider. Discussed warning signs or symptoms. Please see discharge instructions. Patient expresses understanding.     Gregor Hams, MD 05/22/14 1010

## 2014-05-28 ENCOUNTER — Other Ambulatory Visit: Payer: Self-pay | Admitting: *Deleted

## 2014-05-29 NOTE — Telephone Encounter (Signed)
Pt called because all he prescriptions need to have a order number on them [id number]  When sending them to the online pharmacy.Also she really needs this since she is out of some of her medication and she is out of her fluid pills. jw

## 2014-06-02 ENCOUNTER — Other Ambulatory Visit: Payer: Self-pay | Admitting: *Deleted

## 2014-06-02 MED ORDER — TRIAMCINOLONE ACETONIDE 0.5 % EX CREA: TOPICAL_CREAM | CUTANEOUS | Status: DC

## 2014-06-02 MED ORDER — LOSARTAN POTASSIUM 50 MG PO TABS: 50.0000 mg | ORAL_TABLET | Freq: Every day | ORAL | Status: DC

## 2014-06-02 MED ORDER — BUSPIRONE HCL 30 MG PO TABS: 30.0000 mg | ORAL_TABLET | Freq: Two times a day (BID) | ORAL | Status: DC | PRN

## 2014-06-02 MED ORDER — CITALOPRAM HYDROBROMIDE 20 MG PO TABS: 20.0000 mg | ORAL_TABLET | Freq: Every morning | ORAL | Status: DC

## 2014-06-02 MED ORDER — LEVOTHYROXINE SODIUM 75 MCG PO TABS: 75.0000 ug | ORAL_TABLET | Freq: Every day | ORAL | Status: DC

## 2014-06-02 MED ORDER — ASPIRIN 81 MG PO TBEC: 81.0000 mg | DELAYED_RELEASE_TABLET | Freq: Every day | ORAL | Status: DC

## 2014-06-02 MED ORDER — METFORMIN HCL 1000 MG PO TABS: 1000.0000 mg | ORAL_TABLET | Freq: Two times a day (BID) | ORAL | Status: DC

## 2014-06-02 NOTE — Telephone Encounter (Signed)
Assuming it was just a duplicate. Will refuse these since I just dealt with this. Thanks. Hilton Sinclair, MD

## 2014-06-02 NOTE — Telephone Encounter (Signed)
LMOVM for pt to return call .Fleeger, Jessica Dawn  

## 2014-06-02 NOTE — Telephone Encounter (Signed)
Please let Ms Cressler know I have filled all except tramadol which cannot be sent electronically. She will need to come in for a visit for this. Also, it is very important that patient follows up with me in the next 1-2 months to follow up on all of these medications she is taking. Hilton Sinclair, MD

## 2014-06-02 NOTE — Telephone Encounter (Signed)
Tamika, I just refilled these this morning except tramadol because it requires paper rx. Did I miss something?  Hilton Sinclair, MD

## 2014-06-02 NOTE — Telephone Encounter (Signed)
Filled this morning (all except tramadol which has to be paper rx). Will refuse this duplicate request.  Hilton Sinclair, MD

## 2014-06-03 ENCOUNTER — Other Ambulatory Visit: Payer: Self-pay | Admitting: *Deleted

## 2014-06-03 MED ORDER — GLIPIZIDE ER 10 MG PO TB24: 10.0000 mg | ORAL_TABLET | Freq: Every day | ORAL | Status: DC

## 2014-06-03 MED ORDER — SITAGLIPTIN PHOSPHATE 100 MG PO TABS: 100.0000 mg | ORAL_TABLET | Freq: Every morning | ORAL | Status: DC

## 2014-06-03 MED ORDER — POTASSIUM CHLORIDE CRYS ER 20 MEQ PO TBCR: 20.0000 meq | EXTENDED_RELEASE_TABLET | Freq: Every day | ORAL | Status: DC

## 2014-06-03 MED ORDER — PROPRANOLOL HCL 40 MG PO TABS: 40.0000 mg | ORAL_TABLET | Freq: Every day | ORAL | Status: DC

## 2014-06-03 MED ORDER — FUROSEMIDE 20 MG PO TABS: ORAL_TABLET | ORAL | Status: DC

## 2014-06-03 MED ORDER — OMEPRAZOLE 20 MG PO CPDR: DELAYED_RELEASE_CAPSULE | ORAL | Status: DC

## 2014-06-03 MED ORDER — ROSUVASTATIN CALCIUM 20 MG PO TABS: 20.0000 mg | ORAL_TABLET | Freq: Every day | ORAL | Status: DC

## 2014-06-03 NOTE — Telephone Encounter (Signed)
Also received a request for Phenergan Sup.  Medication is not listed on current med list.  Derl Barrow, RN

## 2014-06-03 NOTE — Telephone Encounter (Signed)
Approved all except phenergan at this time because it is not actively listed on her medications.  Hilton Sinclair, MD

## 2014-06-24 ENCOUNTER — Ambulatory Visit: Payer: PRIVATE HEALTH INSURANCE | Admitting: Physical Therapy

## 2014-06-26 ENCOUNTER — Other Ambulatory Visit: Payer: Self-pay | Admitting: Family Medicine

## 2014-06-26 NOTE — Telephone Encounter (Signed)
Pt called and said that she is need of her albuterol and the pump. She said that she has been trying to get this for over a year. jw

## 2014-06-27 MED ORDER — ALBUTEROL SULFATE HFA 108 (90 BASE) MCG/ACT IN AERS: 2.0000 | INHALATION_SPRAY | RESPIRATORY_TRACT | Status: DC | PRN

## 2014-06-27 NOTE — Telephone Encounter (Signed)
It looks like it was filled March 2015 most recently. Refilled.  Hilton Sinclair, MD PGY-3, [place]

## 2014-07-18 ENCOUNTER — Telehealth: Payer: Self-pay | Admitting: Cardiovascular Disease

## 2014-07-18 NOTE — Telephone Encounter (Signed)
Calling to verify why she got a new transmitter in the mail today.  Will have someone call from the device department at [facility]. Patient voiced understanding.

## 2014-07-18 NOTE — Telephone Encounter (Signed)
Pt called in stating that she just received a heart machine in the mail and she is not sure of what to do with it. Please call  Thanks

## 2014-07-18 NOTE — Telephone Encounter (Signed)
I explained to pt the reasons behind her receiving the [employer] monitor. Pt voiced understanding and is aware that we will continue to check her in the office since she states that she has felt symptoms from the Carelink monitor in the past. Patient voiced understanding.

## 2014-07-25 IMAGING — US US PELVIS COMPLETE
1 series · 13 of 25 positions shown · non-contrast
Comparison: None

CLINICAL DATA: Postmenopausal bleeding



[Series 1: us pelvis complete · 0.28mm/px · 13 of 53 slices shown]
[im 1/53]
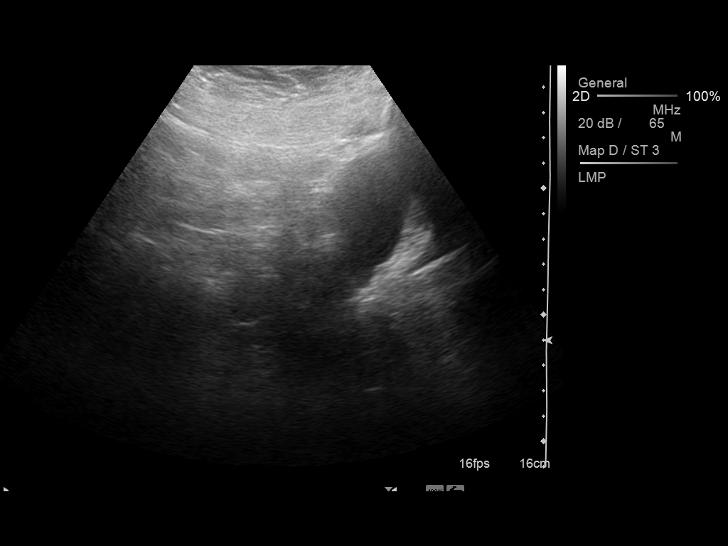
[im 5/53]
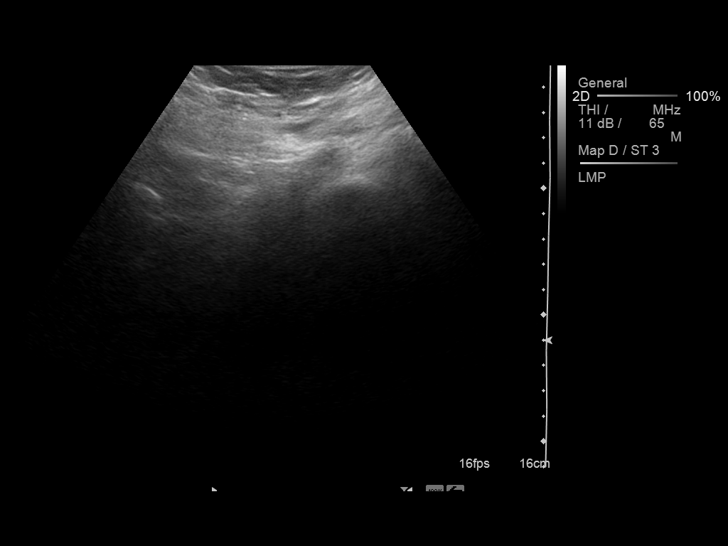
[im 9/53]
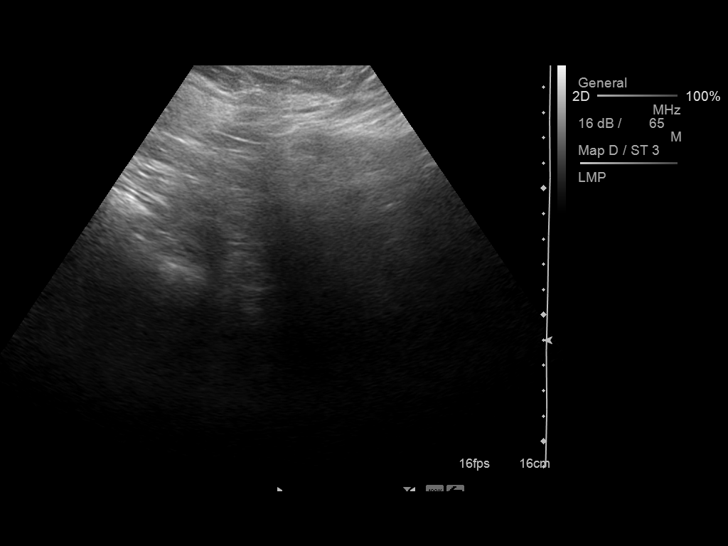
[im 14/53]
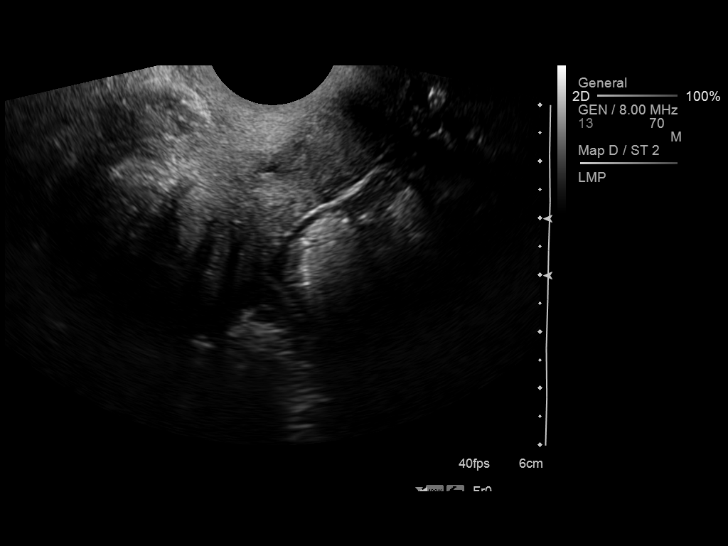
[im 18/53]
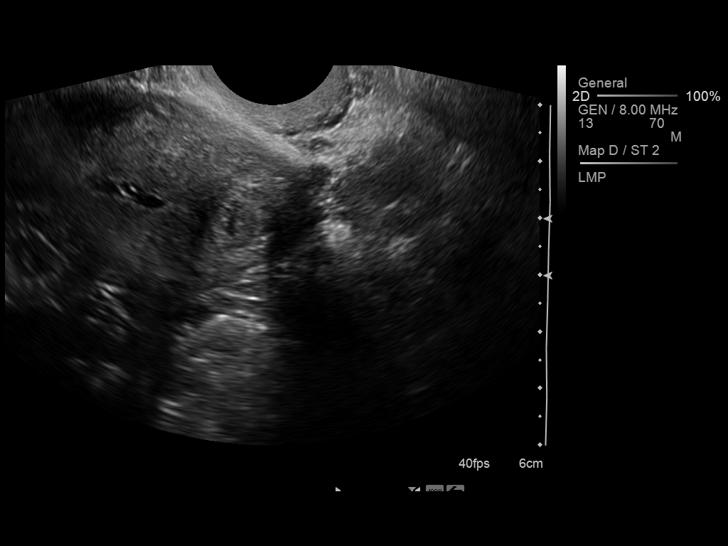
[im 22/53]
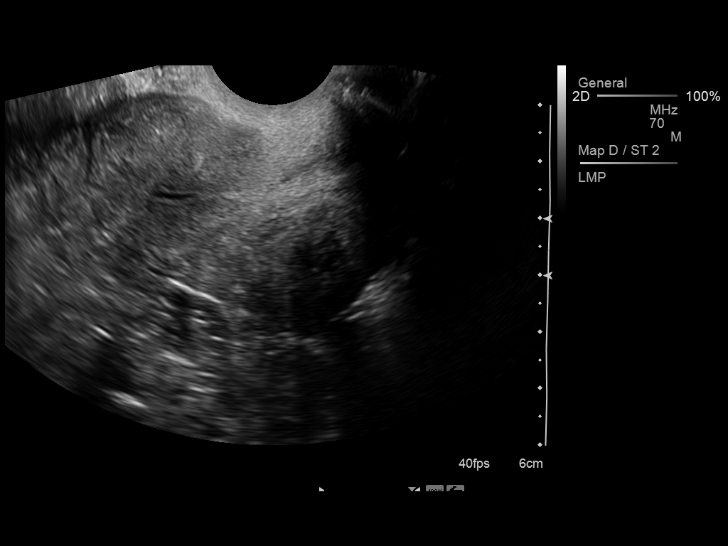
[im 27/53]
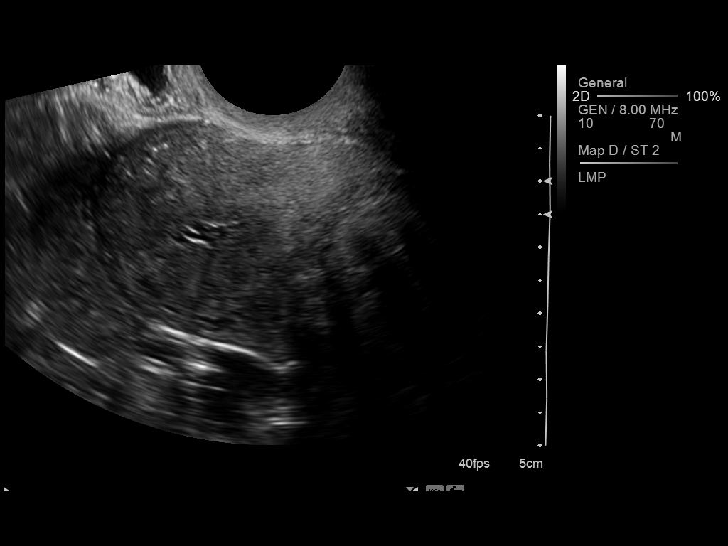
[im 31/53]
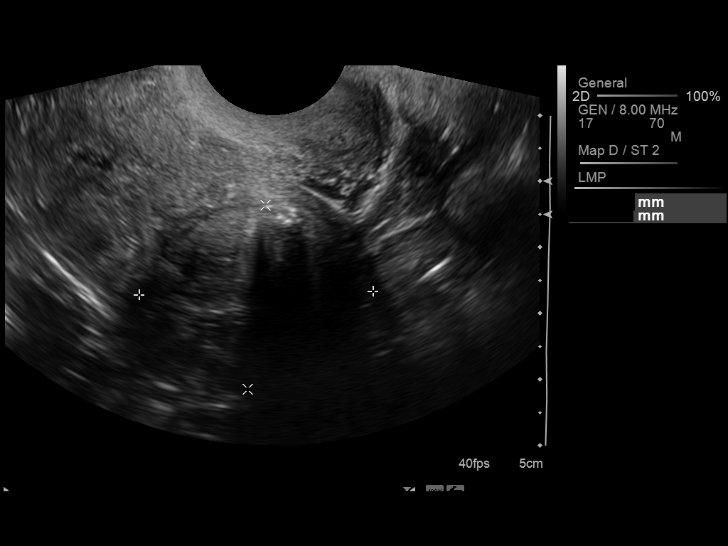
[im 35/53]
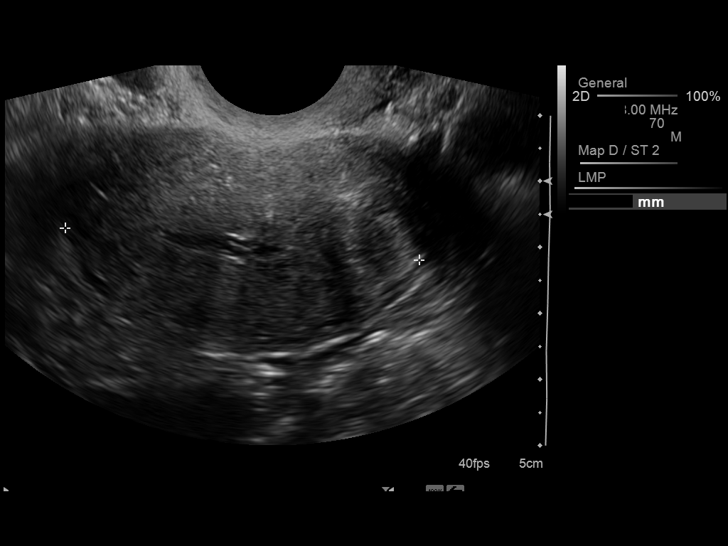
[im 40/53]
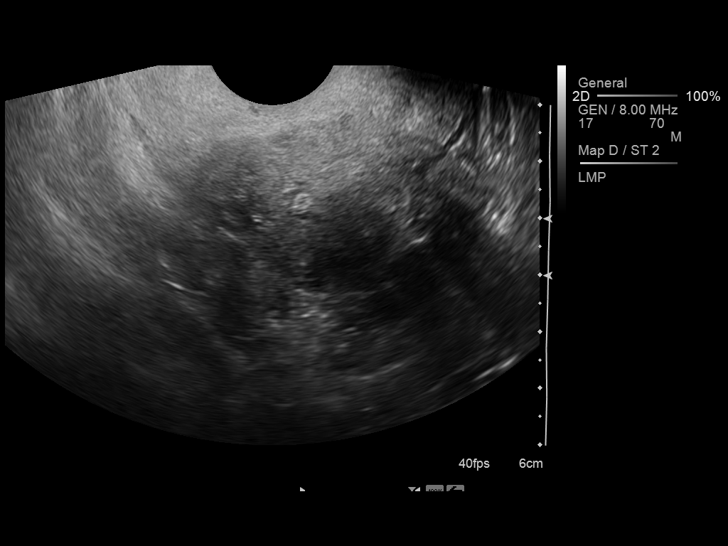
[im 44/53]
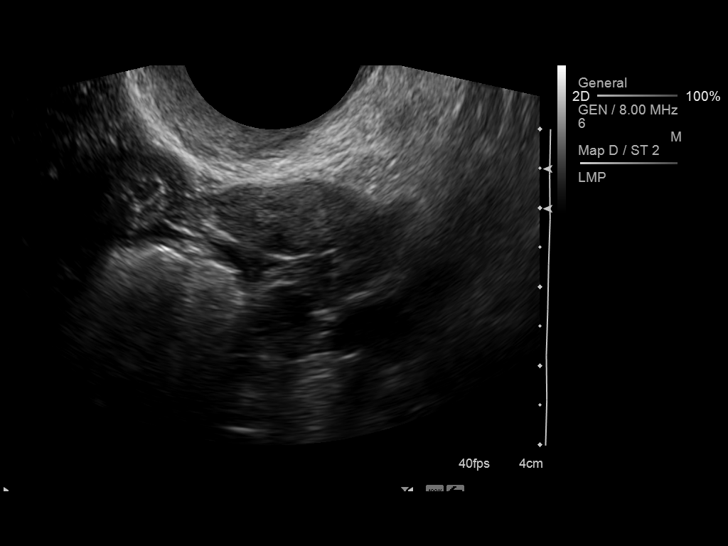
[im 48/53]
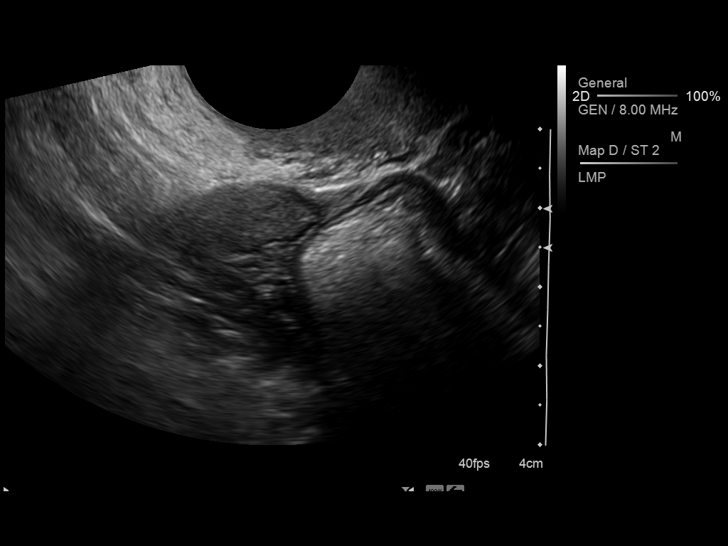
[im 53/53]
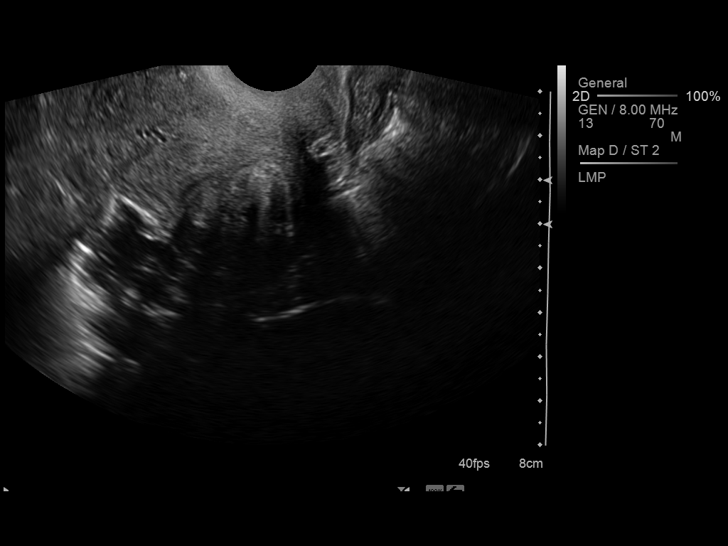

[13 of 25 positions shown; findings below may reference images not displayed]

FINDINGS: Uterus: Is anteverted and anteflexed and demonstrates a sagittal
length of 5.6 cm, depth of 3.1 cm and width of 5.4 cm. Two focal
fibroids are identified one in the posterior lower uterine segment
measuring 3.5 x 2.8 x 3.9 cm and having a partial subserosal
component and a second, mural, in the anterior upper uterine
segment measuring 1.8 x 1.7 x 1.6 cm.

Endometrium: A small amount of fluid is identified within the
endometrial canal.  Some thin echogenic lines are seen within the
fluid in the endometrium and these may represent synechia.  The
lining appears thin with a single layer measurement of 1 mm and no
areas of focal thickening or heterogeneity are suggested

Right ovary:  Is not visualized with confidence either
transabdominally or endovaginally

Left ovary: Has a normal appearance measuring 2.0 x 1.9 x 0.9 cm

Other findings: No pelvic fluid or separate adnexal masses are
seen.
IMPRESSION: Fibroid uterus with fibroid sizes and locations as noted above.

Thin endometrial lining. In the setting of post-menopausal
bleeding, this is consistent with a benign etiology such as
endometrial atrophy.  If bleeding remains unresponsive to hormonal
or medical therapy, sonohysterogram should be considered for focal
lesion work-up. (Ref:  Radiological Reasoning: Algorithmic Workup
of Abnormal Vaginal Bleeding with Endovaginal Sonography and
Sonohysterography. AJR 1993; 191:S68-73).

Thin linear echo reflections within fluid in the endometrial canal
may signify synechia.

Normal left ovary and non-visualized right ovary

## 2014-07-27 ENCOUNTER — Other Ambulatory Visit: Payer: Self-pay | Admitting: Family Medicine

## 2014-07-29 ENCOUNTER — Ambulatory Visit: Payer: PRIVATE HEALTH INSURANCE

## 2014-08-18 ENCOUNTER — Ambulatory Visit (INDEPENDENT_AMBULATORY_CARE_PROVIDER_SITE_OTHER): Payer: Medicare Other | Admitting: *Deleted

## 2014-08-18 ENCOUNTER — Other Ambulatory Visit: Payer: Self-pay | Admitting: Family Medicine

## 2014-08-18 ENCOUNTER — Encounter: Payer: Self-pay | Admitting: Family Medicine

## 2014-08-18 ENCOUNTER — Ambulatory Visit (INDEPENDENT_AMBULATORY_CARE_PROVIDER_SITE_OTHER): Payer: Medicare Other | Admitting: Family Medicine

## 2014-08-18 VITALS — BP 120/84 | HR 96 | Temp 98.2°F | Wt 137.0 lb

## 2014-08-18 DIAGNOSIS — I10 Essential (primary) hypertension: Secondary | ICD-10-CM | POA: Diagnosis not present

## 2014-08-18 DIAGNOSIS — E118 Type 2 diabetes mellitus with unspecified complications: Secondary | ICD-10-CM

## 2014-08-18 DIAGNOSIS — Z23 Encounter for immunization: Secondary | ICD-10-CM

## 2014-08-18 LAB — POCT GLYCOSYLATED HEMOGLOBIN (HGB A1C): Hemoglobin A1C: 7.4

## 2014-08-18 NOTE — Progress Notes (Signed)
Patient ID: Mercedes Barr, female   DOB: [birth year], 69 y.o.   MRN: [id number]  Subjective:   CC: Follow up diabetes  HPI:   Diabetes follow up A1c last visit 6.9 March. Haven't been taking as good care of self because of husband's illness and recent death in 06/11/2023. Taking januvia, metformin, glipizide, crestor, aspirin, losartan daily without missing any doses. Blood sugar in the past month has ranged 120s-200 (higher in times of stress). No symptoms of hypoglycemia (anxiety, diaphoresis, dizziness, headache) or hyperglycemia (polyuria, polydypsia, chest pain) though had occasional dyspnea when dealing with Roscoe's illness. Extra pillow helped. Was exceptionally stressed at that time. No dizziness or faitning. No dyspnea lately. Eye exam: Needs to make appointment for this Foot exam: Not Diet - trying to avoid sweets; drinks 3 cups daily; cut back on unhealthy snacking Exercise difficult with right knee replacement because of swelling. Very active. Keep   HTN Does not check BP at home. Denies chest pain or current dyspnea. Denies dizziness, fainting, headaches, or leg swelling. Does not eat much salt because had to make late husband low-salt meals.   Review of Systems - Per HPI.   Smoking status: Never smoker SH: Husband died a few months ago.    Objective:  Physical Exam BP 120/84 mmHg  Pulse 96  Temp(Src) 98.2 F (36.8 C) (Oral)  Wt 137 lb (62.143 kg) GEN: NAD, pleasant HEENT: Atraumatic, normocephalic  CV: RRR, no m/r/g PULM: CTAB, normal effort ABD: Soft, nontender, nondistended SKIN: No rash or cyanosis; warm and well-perfused EXTR: No lower extremity edema or calf tenderness PSYCH: Mood and affect euthymic, normal rate and volume of speech NEURO: Awake, alert, no focal deficits grossly, normal speech  Assessment:     Mercedes Barr is a 69 y.o. female here for medication refill.    Plan:   # Health Maintenance: Flu shot today.   Follow-up: Follow up in  3 months for next DM/HTN f/u.   Hilton Sinclair, MD [place]

## 2014-08-18 NOTE — Assessment & Plan Note (Signed)
Diabetes still well controlled with A1c up to 7.4 from 6.9. Active but not much intentional exercise and some high sugar intake with ginger ale. Intermittent nocturnal dyspnea a few months ago likely due to stress / anxiety around time of husband's death; resolved. - Continue januvia, metformin, glipizide, crestor, aspirin, losartan daily - Make eye exam appointment - Foot exam - pt requested to do next visit - lipids today, bmet - cut to 1 cup daily sweet beverage - increase walking - wear knee brace and try to keep active and walk just for self

## 2014-08-18 NOTE — Assessment & Plan Note (Signed)
Typically well-controlled in clinic. - Continue propranolol, losartan, lasix. - BMET today. - Continue to keep added salt low as did with husband's diet while he was sick.

## 2014-08-18 NOTE — Telephone Encounter (Signed)
Pt was in the office today and MD asked if she needed any meds, pt couldn't remember but now says she is out of levothyroxine 75 mg, would like it sent to [address]

## 2014-08-18 NOTE — Patient Instructions (Signed)
Good to see you.  For your diabetes, keep taking all medications as prescribed. Stay active and try to walk 10-15 minutes a day just for you (or more if your knee allows). Wear a knee sleeve to see if this helps with the pain and swelling. We are getting labs today and I will call if any are NOT normal. Cut down to no more than 8 oz daily of sweet beverage. Call to make eye exam appointment.   You got your flu shot today.  Follow up with me in 3 months or sooner if needed for any other reason.  Best,  Hilton Sinclair, MD

## 2014-08-19 LAB — BASIC METABOLIC PANEL
BUN: 12 mg/dL (ref 6–23)
CALCIUM: 9.9 mg/dL (ref 8.4–10.5)
CO2: 32 mEq/L (ref 19–32)
Chloride: 100 mEq/L (ref 96–112)
Creat: 0.81 mg/dL (ref 0.50–1.10)
GLUCOSE: 178 mg/dL — AB (ref 70–99)
Potassium: 3.8 mEq/L (ref 3.5–5.3)
SODIUM: 143 meq/L (ref 135–145)

## 2014-08-19 LAB — LIPID PANEL
Cholesterol: 197 mg/dL (ref 0–200)
HDL: 35 mg/dL — ABNORMAL LOW (ref >39)
LDL CALC: 122 mg/dL — AB (ref 0–99)
Total CHOL/HDL Ratio: 5.6 Ratio
Triglycerides: 202 mg/dL — ABNORMAL HIGH (ref <150)
VLDL: 40 mg/dL (ref 0–40)

## 2014-08-19 MED ORDER — LEVOTHYROXINE SODIUM 75 MCG PO TABS: 75.0000 ug | ORAL_TABLET | Freq: Every day | ORAL | Status: DC

## 2014-08-21 ENCOUNTER — Telehealth: Payer: Self-pay | Admitting: Family Medicine

## 2014-08-21 NOTE — Telephone Encounter (Signed)
Please call Mercedes Barr to let her know her lipids are worse than previously. She is on a high-intensity statin (rosouvastatin 20mg  daily). Please ensure she is taking this daily. I know she is working harder now on diet and exercise regularly and this will certainly help too. We will recheck in 1 year.   Hilton Sinclair, MD

## 2014-08-21 NOTE — Telephone Encounter (Signed)
Pt is aware of results.  States that she hasn't been taking her medication daily due to her husband passing.  She will start taking them again. Mercedes Barr,CMA

## 2014-08-26 ENCOUNTER — Telehealth: Payer: Self-pay | Admitting: Family Medicine

## 2014-08-26 MED ORDER — ALBUTEROL SULFATE (2.5 MG/3ML) 0.083% IN NEBU: 2.5000 mg | INHALATION_SOLUTION | RESPIRATORY_TRACT | Status: DC | PRN

## 2014-08-26 MED ORDER — GLUCOSE BLOOD VI STRP: ORAL_STRIP | Status: DC

## 2014-08-26 MED ORDER — POLYETHYLENE GLYCOL 3350 17 GM/SCOOP PO POWD: 17.0000 g | Freq: Two times a day (BID) | ORAL | Status: DC

## 2014-08-26 NOTE — Telephone Encounter (Signed)
Please let her know that I have filled her albuterol nebulizer solution, miralax, and test strips. However, I cannot know the right medication to prescribe for anxiety without an office visit where we discuss what is going on. In the meantime, she can try relaxation techniques 1 hour before bed like turning off television, hot shower, cool dark room. Thanks!  Hilton Sinclair, MD

## 2014-08-26 NOTE — Telephone Encounter (Signed)
Mercedes Barr is asking for refill on her Miralax.  Just used last refill on the 9th of this month. Also haven't received refill for her albuterol for her nebulizer and her test strips

## 2014-08-26 NOTE — Telephone Encounter (Signed)
Pt called and would like the doctor to call her in some nerve pills. Ever since [place] passed away she  Is having trouble sleeping. Please call them in to [facility] on [address]

## 2014-08-27 NOTE — Telephone Encounter (Signed)
Pt is aware of advice from MD.  States that she will try relaxing techniques and call back for an appt.  She states that last time she couldn't sleep and was itching it was her nerves.  Advised her to still make an appt for the proper medication to be given. Arletta Lumadue,CMA

## 2014-09-03 ENCOUNTER — Other Ambulatory Visit: Payer: Self-pay | Admitting: Family Medicine

## 2014-09-03 NOTE — Telephone Encounter (Signed)
Received [facility] paperwork requesting ipratropium-albuterol but I do not see this on her medication list (this also happed 11-20-13). Will not approve.  Hilton Sinclair, MD

## 2014-09-10 ENCOUNTER — Ambulatory Visit (INDEPENDENT_AMBULATORY_CARE_PROVIDER_SITE_OTHER): Payer: Medicare Other | Admitting: Family Medicine

## 2014-09-10 ENCOUNTER — Encounter: Payer: Self-pay | Admitting: Family Medicine

## 2014-09-10 VITALS — BP 129/73 | HR 88 | Temp 97.8°F | Ht 62.0 in | Wt 132.2 lb

## 2014-09-10 DIAGNOSIS — R0602 Shortness of breath: Secondary | ICD-10-CM

## 2014-09-10 DIAGNOSIS — G4700 Insomnia, unspecified: Secondary | ICD-10-CM | POA: Diagnosis not present

## 2014-09-10 DIAGNOSIS — L299 Pruritus, unspecified: Secondary | ICD-10-CM | POA: Diagnosis not present

## 2014-09-10 MED ORDER — TRAZODONE HCL 50 MG PO TABS: 25.0000 mg | ORAL_TABLET | Freq: Every day | ORAL | Status: DC

## 2014-09-10 NOTE — Assessment & Plan Note (Addendum)
Likely related to the death of her husband. Refilled trazodone today. Sleep hygiene advice/suggestions given in AVS. Patient needs to follow up closely with PCP regarding this.

## 2014-09-10 NOTE — Assessment & Plan Note (Signed)
Unclear etiology w/ pleuritic component.  Could be related to anxiety/grief given death of husband.  No dyspnea and normal lung exam. Reassurance provided.  Advised PRN tylenol for pain/discomfort and close PCP follow up.

## 2014-09-10 NOTE — Assessment & Plan Note (Signed)
Mild dry skin on exam.  Otherwise exam unremarkable. Advised continued use of lotion and follow up with PCP if persists.

## 2014-09-10 NOTE — Patient Instructions (Addendum)
It is nice to see you today.  Your lung exam is normal.  I'm unsure why you're having pain or shortness of breath at night.  It could be related to anxiety/recent death of her husband.  Use Tylenol as needed for discomfort.  Regardin your insomnia, I am refilling your trazodone. Below are some tips for good sleep hygiene which may help your insomnia. Basic rules for a good night's sleep Sleep only as much as you need to feel rested and then get out of bed Keep a regular sleep schedule Avoid forcing sleep Exercise regularly for at least 20 minutes, preferably 4 to 5 hours before bedtime Avoid caffeinated beverages after lunch Avoid alcohol near bedtime: no "night cap" Avoid smoking, especially in the evening Do not go to bed hungry Adjust bedroom environment Avoid prolonged use of light-emitting screens before bedtime  Deal with your worries before bedtime  You can use Tylenol and Aspercreme for your shoulder pain.   Follow up closely with Dr. Darene Lamer.  Take care  Dr. Lacinda Axon

## 2014-09-10 NOTE — Progress Notes (Signed)
   Subjective:    Patient ID: Mercedes Barr, female    DOB: 11/16/1945, 69 y.o.   MRN: [id number]  HPI 69 year old female with history of complete heart block status post pacemaker, COPD, depression, DM 2, hypertension, and hyperlipidemia presents for same day appointment with complaints of shortness of breath, insomnia, and itching.  1) SOB  Patient reports she's been experiencing shortness of breath and pain with inspiration for approximately 2 weeks.  She reports that this only occurs at night.  No recent fevers, chills.  No other associated symptoms: Denies cough, PND, orthopnea.  2) Insomnia & Itching  She reports that since the death of her husband in 2023-06-30 she's been experiencing insomnia.  She has also been experiencing significant diffuse itching at night.   Patient notes that she has trouble falling asleep.  She reports that she because of that around 11 PM after watching the news.   She denies any caffeine intake.  Regarding her itching,  she reports that it encompasses her "whole body".  She has been using lotion with little improvement.   Review of Systems Per HPI    Objective:   Physical Exam Filed Vitals:   09/10/14 0954  BP: 129/73  Pulse: 88  Temp: 97.8 F (36.6 C)   Exam: General: well appearing elderly female in NAD.  HEENT: No scleral icterus. Cardiovascular: RRR. No murmurs, rubs, or gallops. Respiratory: CTAB. No rales, rhonchi, or wheeze. Abdomen: soft, nontender, nondistended. Extremities: Skin: Dry skin noted.  Diabetic Foot Check -  Appearance - no lesions, ulcers or calluses Skin - no unusual pallor or redness Monofilament testing -  Right - Great toe, medial, central, lateral ball and posterior foot intact Left - Great toe, medial, central, lateral ball and posterior foot intact     Assessment & Plan:  See Problem List

## 2014-09-11 ENCOUNTER — Ambulatory Visit: Payer: Self-pay | Admitting: Orthopedic Surgery

## 2014-09-11 NOTE — Progress Notes (Signed)
Preoperative surgical orders have been place into the Epic hospital system for Mercedes Barr on 09/11/2014, 5:37 PM  by Mickel Crow for surgery on 09-24-2014.  Preop Total Knee orders including Experal, IV Tylenol, and IV Decadron as long as there are no contraindications to the above medications. [name], PA-C

## 2014-09-11 NOTE — Progress Notes (Signed)
Please put orders in Epic surgery 09-24-14 pre op 09-18-14 Thanks

## 2014-09-15 ENCOUNTER — Telehealth: Payer: Self-pay | Admitting: Family Medicine

## 2014-09-15 DIAGNOSIS — E119 Type 2 diabetes mellitus without complications: Secondary | ICD-10-CM

## 2014-09-15 DIAGNOSIS — E785 Hyperlipidemia, unspecified: Secondary | ICD-10-CM

## 2014-09-15 DIAGNOSIS — R259 Unspecified abnormal involuntary movements: Secondary | ICD-10-CM

## 2014-09-15 DIAGNOSIS — I10 Essential (primary) hypertension: Secondary | ICD-10-CM

## 2014-09-15 DIAGNOSIS — K219 Gastro-esophageal reflux disease without esophagitis: Secondary | ICD-10-CM

## 2014-09-15 MED ORDER — FUROSEMIDE 20 MG PO TABS: ORAL_TABLET | ORAL | Status: DC

## 2014-09-15 MED ORDER — ROSUVASTATIN CALCIUM 20 MG PO TABS: 20.0000 mg | ORAL_TABLET | Freq: Every day | ORAL | Status: DC

## 2014-09-15 MED ORDER — GLIPIZIDE ER 10 MG PO TB24: 10.0000 mg | ORAL_TABLET | Freq: Every day | ORAL | Status: DC

## 2014-09-15 MED ORDER — TRIAMCINOLONE ACETONIDE 0.5 % EX CREA: TOPICAL_CREAM | CUTANEOUS | Status: DC

## 2014-09-15 MED ORDER — SITAGLIPTIN PHOSPHATE 100 MG PO TABS: 100.0000 mg | ORAL_TABLET | Freq: Every morning | ORAL | Status: DC

## 2014-09-15 MED ORDER — POTASSIUM CHLORIDE CRYS ER 20 MEQ PO TBCR: 20.0000 meq | EXTENDED_RELEASE_TABLET | Freq: Every day | ORAL | Status: DC

## 2014-09-15 MED ORDER — GLUCOSE BLOOD VI STRP: ORAL_STRIP | Status: DC

## 2014-09-15 MED ORDER — TRAVOPROST (BAK FREE) 0.004 % OP SOLN: 1.0000 [drp] | Freq: Every day | OPHTHALMIC | Status: AC

## 2014-09-15 MED ORDER — PROPRANOLOL HCL 40 MG PO TABS: 40.0000 mg | ORAL_TABLET | Freq: Every day | ORAL | Status: DC

## 2014-09-15 MED ORDER — OMEPRAZOLE 20 MG PO CPDR: DELAYED_RELEASE_CAPSULE | ORAL | Status: DC

## 2014-09-15 NOTE — Telephone Encounter (Signed)
Patient provided list of medications needing refill at last appointment.  Will refill: Lasix Glipizide Test strips K+ januvia crestor Omeprazole 20mg  PRopranolol Travaprost Triamcinolone  Mercedes Sinclair, MD

## 2014-09-18 ENCOUNTER — Encounter (HOSPITAL_COMMUNITY)
Admission: RE | Admit: 2014-09-18 | Discharge: 2014-09-18 | Disposition: A | Discharge status: Home / Self Care | Payer: Medicare Other | Source: Ambulatory Visit | Attending: Orthopedic Surgery | Admitting: Orthopedic Surgery

## 2014-09-18 ENCOUNTER — Encounter (HOSPITAL_COMMUNITY): Payer: Self-pay

## 2014-09-18 ENCOUNTER — Ambulatory Visit (HOSPITAL_COMMUNITY)
Admission: RE | Admit: 2014-09-18 | Discharge: 2014-09-18 | Disposition: A | Discharge status: Home / Self Care | Payer: Medicare Other | Source: Ambulatory Visit | Attending: Anesthesiology | Admitting: Anesthesiology

## 2014-09-18 DIAGNOSIS — J449 Chronic obstructive pulmonary disease, unspecified: Secondary | ICD-10-CM

## 2014-09-18 DIAGNOSIS — M179 Osteoarthritis of knee, unspecified: Secondary | ICD-10-CM | POA: Diagnosis not present

## 2014-09-18 DIAGNOSIS — Z01818 Encounter for other preprocedural examination: Secondary | ICD-10-CM | POA: Diagnosis not present

## 2014-09-18 DIAGNOSIS — Z01811 Encounter for preprocedural respiratory examination: Secondary | ICD-10-CM | POA: Diagnosis not present

## 2014-09-18 HISTORY — DX: Reserved for inherently not codable concepts without codable children: IMO0001

## 2014-09-18 HISTORY — DX: Unspecified asthma, uncomplicated: J45.909

## 2014-09-18 LAB — COMPREHENSIVE METABOLIC PANEL
ALK PHOS: 37 U/L — AB (ref 39–117)
ALT: 18 U/L (ref 0–35)
ANION GAP: 8 (ref 5–15)
AST: 26 U/L (ref 0–37)
Albumin: 4.2 g/dL (ref 3.5–5.2)
BUN: 11 mg/dL (ref 6–23)
CO2: 28 mmol/L (ref 19–32)
Calcium: 9.4 mg/dL (ref 8.4–10.5)
Chloride: 104 mmol/L (ref 96–112)
Creatinine, Ser: 0.74 mg/dL (ref 0.50–1.10)
GFR calc non Af Amer: 86 mL/min — ABNORMAL LOW (ref >90)
Glucose, Bld: 257 mg/dL — ABNORMAL HIGH (ref 70–99)
Potassium: 3.5 mmol/L (ref 3.5–5.1)
Sodium: 140 mmol/L (ref 135–145)
Total Bilirubin: 0.5 mg/dL (ref 0.3–1.2)
Total Protein: 7.3 g/dL (ref 6.0–8.3)

## 2014-09-18 LAB — CBC
HEMATOCRIT: 39.2 % (ref 36.0–46.0)
HEMOGLOBIN: 12.5 g/dL (ref 12.0–15.0)
MCH: 27.4 pg (ref 26.0–34.0)
MCHC: 31.9 g/dL (ref 30.0–36.0)
MCV: 86 fL (ref 78.0–100.0)
Platelets: 234 10*3/uL (ref 150–400)
RBC: 4.56 MIL/uL (ref 3.87–5.11)
RDW: 12.8 % (ref 11.5–15.5)
WBC: 6.3 10*3/uL (ref 4.0–10.5)

## 2014-09-18 LAB — SURGICAL PCR SCREEN
MRSA, PCR: NEGATIVE
STAPHYLOCOCCUS AUREUS: NEGATIVE

## 2014-09-18 LAB — URINE MICROSCOPIC-ADD ON

## 2014-09-18 LAB — URINALYSIS, ROUTINE W REFLEX MICROSCOPIC
Glucose, UA: >1000 mg/dL — AB
Hgb urine dipstick: NEGATIVE
KETONES UR: NEGATIVE mg/dL
Leukocytes, UA: NEGATIVE
Nitrite: NEGATIVE
Protein, ur: NEGATIVE mg/dL
Specific Gravity, Urine: 1.043 — ABNORMAL HIGH (ref 1.005–1.030)
UROBILINOGEN UA: 0.2 mg/dL (ref 0.0–1.0)
pH: 5 (ref 5.0–8.0)

## 2014-09-18 LAB — APTT: aPTT: 31 seconds (ref 24–37)

## 2014-09-18 LAB — PROTIME-INR
INR: 1.04 (ref 0.00–1.49)
Prothrombin Time: 13.7 seconds (ref 11.6–15.2)

## 2014-09-18 NOTE — Patient Instructions (Addendum)
[place]  09/18/2014   Your procedure is scheduled on:      Wednesday September 24, 2014   Report to [facility] Main Entrance and follow signs to  [facility] arrive at 12:15 PM.   Call this number if you have problems the morning of surgery [phone] or Presurgical Testing [phone].   Remember:  Do not eat food after Midnight but may take clears day of surgery till 9:15am then nothing by mouth.  For Living Will and/or Health Care Power Attorney Forms: please provide copy for your medical record, may bring AM of surgery (forms should be already notarized-we do not provide this service).     Take these medicines the morning of surgery with A SIP OF WATER: Citalopram;Hydrocodone-Acetaminophen if needed;Levothyroxine:Lyricia;Omeprazole;Propranolol (Inderal); Flovent inhaler if needed - bring day of surgery; Adbuterol inhaler if needed - bring day of surgery; Albuterol Nebulizer if needed                               You may not have any metal on your body including hair pins and piercings  Do not wear jewelry, make-up, lotions, powders, prefumes or deodorant.  Do not shave body hair  48 hours(2 days) of CHG soap use.                Do not bring valuables to the hospital. [place].  Contacts, dentures or bridgework may not be worn into surgery.  Leave suitcase in the car. After surgery it may be brought to your room.  For patients admitted to the hospital, checkout time is 11:00 AM the day of discharge.     Special Instructions: review fact sheets for MRSA information, Blood Transfusion fact sheet, Incentive Spirometry.  Remember: Type/Screen "Blue armsbands"- may not be removed once applied(would result in being retested AM of surgery, if removed). Night prior to surgery shower with dial soap,use regular shampoo for hair. Rinse off,dry off, wear clean pajamas or gown and place clean sheets on bed. Next morning take same  type of shower; Do not apply any lotions/deodorants the morning of surgery.  Please wear clean clothes to the hospital/surgery center.  FAILURE TO FOLLOW THESE INSTRUCTIONS MAY RESULT IN THE CANCELLATION OF YOUR SURGERY PATIENT SIGNATURE_________________________________  NURSE SIGNATURE__________________________________  ________________________________________________________________________    CLEAR LIQUID DIET   Foods Allowed                                                                     Foods Excluded  Coffee and tea, regular and decaf                             liquids that you cannot  Plain Jell-O in any flavor                                             see through such as: Fruit ices (not with fruit pulp)  milk, soups, orange juice  Iced Popsicles                                    All solid food Carbonated beverages, regular and diet                                    Cranberry, grape and apple juices Sports drinks like Gatorade Lightly seasoned clear broth or consume(fat free) Sugar, honey syrup  Sample Menu Breakfast                                Lunch                                     Supper Cranberry juice                    Beef broth                            Chicken broth Jell-O                                     Grape juice                           Apple juice Coffee or tea                        Jell-O                                      Popsicle                                                Coffee or tea                        Coffee or tea  _____________________________________________________________________    Incentive Spirometer  An incentive spirometer is a tool that can help keep your lungs clear and active. This tool measures how well you are filling your lungs with each breath. Taking long deep breaths may help reverse or decrease the chance of developing breathing (pulmonary) problems (especially  infection) following:  A long period of time when you are unable to move or be active. BEFORE THE PROCEDURE   If the spirometer includes an indicator to show your best effort, your nurse or respiratory therapist will set it to a desired goal.  If possible, sit up straight or lean slightly forward. Try not to slouch.  Hold the incentive spirometer in an upright position. INSTRUCTIONS FOR USE   Sit on the edge of your bed if possible, or sit up as far as you can in bed or on a chair.  Hold the incentive spirometer in an upright position.  Breathe out normally.  Place the mouthpiece in your mouth and seal your lips tightly around it.  Breathe in slowly and as deeply as possible, raising the piston or the ball toward the top of the column.  Hold your breath for 3-5 seconds or for as long as possible. Allow the piston or ball to fall to the bottom of the column.  Remove the mouthpiece from your mouth and breathe out normally.  Rest for a few seconds and repeat Steps 1 through 7 at least 10 times every 1-2 hours when you are awake. Take your time and take a few normal breaths between deep breaths.  The spirometer may include an indicator to show your best effort. Use the indicator as a goal to work toward during each repetition.  After each set of 10 deep breaths, practice coughing to be sure your lungs are clear. If you have an incision (the cut made at the time of surgery), support your incision when coughing by placing a pillow or rolled up towels firmly against it. Once you are able to get out of bed, walk around indoors and cough well. You may stop using the incentive spirometer when instructed by your caregiver.  RISKS AND COMPLICATIONS  Take your time so you do not get dizzy or light-headed.  If you are in pain, you may need to take or ask for pain medication before doing incentive spirometry. It is harder to take a deep breath if you are having pain. AFTER USE  Rest and  breathe slowly and easily.  It can be helpful to keep track of a log of your progress. Your caregiver can provide you with a simple table to help with this. If you are using the spirometer at home, follow these instructions: [place] IF:   You are having difficultly using the spirometer.  You have trouble using the spirometer as often as instructed.  Your pain medication is not giving enough relief while using the spirometer.  You develop fever of 100.5 F (38.1 C) or higher. SEEK IMMEDIATE MEDICAL CARE IF:   You cough up bloody sputum that had not been present before.  You develop fever of 102 F (38.9 C) or greater.  You develop worsening pain at or near the incision site. MAKE SURE YOU:   Understand these instructions.  Will watch your condition.  Will get help right away if you are not doing well or get worse. Document Released: 12/05/2006 Document Revised: 10/17/2011 Document Reviewed: 02/05/2007 ExitCare Patient Information 2014 [employer], [place].   ________________________________________________________________________  WHAT IS A BLOOD TRANSFUSION? Blood Transfusion Information  A transfusion is the replacement of blood or some of its parts. Blood is made up of multiple cells which provide different functions.  Red blood cells carry oxygen and are used for blood loss replacement.  White blood cells fight against infection.  Platelets control bleeding.  Plasma helps clot blood.  Other blood products are available for specialized needs, such as hemophilia or other clotting disorders. BEFORE THE TRANSFUSION  Who gives blood for transfusions?   Healthy volunteers who are fully evaluated to make sure their blood is safe. This is blood bank blood. Transfusion therapy is the safest it has ever been in the practice of medicine. Before blood is taken from a donor, a complete history is taken to make sure that person has no history of diseases nor engages in  risky social behavior (examples are intravenous drug use or sexual activity with multiple partners). The donor's travel history is  screened to minimize risk of transmitting infections, such as malaria. The donated blood is tested for signs of infectious diseases, such as HIV and hepatitis. The blood is then tested to be sure it is compatible with you in order to minimize the chance of a transfusion reaction. If you or a relative donates blood, this is often done in anticipation of surgery and is not appropriate for emergency situations. It takes many days to process the donated blood. RISKS AND COMPLICATIONS Although transfusion therapy is very safe and saves many lives, the main dangers of transfusion include:   Getting an infectious disease.  Developing a transfusion reaction. This is an allergic reaction to something in the blood you were given. Every precaution is taken to prevent this. The decision to have a blood transfusion has been considered carefully by your caregiver before blood is given. Blood is not given unless the benefits outweigh the risks. AFTER THE TRANSFUSION  Right after receiving a blood transfusion, you will usually feel much better and more energetic. This is especially true if your red blood cells have gotten low (anemic). The transfusion raises the level of the red blood cells which carry oxygen, and this usually causes an energy increase.  The nurse administering the transfusion will monitor you carefully for complications. HOME CARE INSTRUCTIONS  No special instructions are needed after a transfusion. You may find your energy is better. Speak with your caregiver about any limitations on activity for underlying diseases you may have. SEEK MEDICAL CARE IF:   Your condition is not improving after your transfusion.  You develop redness or irritation at the intravenous (IV) site. SEEK IMMEDIATE MEDICAL CARE IF:  Any of the following symptoms occur over the next 12  hours:  Shaking chills.  You have a temperature by mouth above 102 F (38.9 C), not controlled by medicine.  Chest, back, or muscle pain.  People around you feel you are not acting correctly or are confused.  Shortness of breath or difficulty breathing.  Dizziness and fainting.  You get a rash or develop hives.  You have a decrease in urine output.  Your urine turns a dark color or changes to pink, red, or brown. Any of the following symptoms occur over the next 10 days:  You have a temperature by mouth above 102 F (38.9 C), not controlled by medicine.  Shortness of breath.  Weakness after normal activity.  The white part of the eye turns yellow (jaundice).  You have a decrease in the amount of urine or are urinating less often.  Your urine turns a dark color or changes to pink, red, or brown. Document Released: 07/22/2000 Document Revised: 10/17/2011 Document Reviewed: 03/10/2008 [facility] Patient Information 2014 [place], [place].  _______________________________________________________________________

## 2014-09-18 NOTE — Progress Notes (Signed)
cmet and micro [place] results faxed by epic to dr Wynelle Link

## 2014-09-19 NOTE — H&P (Signed)
TOTAL KNEE REVISION ADMISSION H&P  Patient is being admitted for right revision total knee arthroplasty.  Subjective:  Chief Complaint:right knee pain.  HPI: Mercedes Barr, 69 y.o. female, has a history of pain and functional disability in the right knee(s) due to failed previous arthroplasty and patient has failed non-surgical conservative treatments for greater than 12 weeks to include NSAID's and/or analgesics, flexibility and strengthening excercises, supervised PT with diminished ADL's post treatment, use of assistive devices and activity modification. The indications for the revision of the total knee arthroplasty are loosening of one or more components. Onset of symptoms was gradual starting 2 years ago with gradually worsening course since that time.  Prior procedures on the right knee(s) include arthroplasty.  Patient currently rates pain in the right knee(s) at 7 out of 10 with activity. There is night pain, worsening of pain with activity and weight bearing, pain that interferes with activities of daily living, pain with passive range of motion and joint swelling.  Patient has evidence of prosthetic loosening by imaging studies. This condition presents safety issues increasing the risk of falls.  There is no current active infection.  Patient Active Problem List   Diagnosis Date Noted  . Itching 09/10/2014  . Shortness of breath 10/29/2013  . Health care maintenance 06/26/2013  . Complete heart block   . Normal coronary arteries-Feb 2011   . Pacemaker- last gen change was a MDT Nov 2008   . Weight loss, unintentional 12/21/2012  . Osteoarthritis of right knee 08/22/2012  . Insomnia 03/08/2012  . Allergy to bee sting 10/04/2011  . Low back pain radiating to both legs 09/07/2011  . THYROID STIMULATING HORMONE, ABNORMAL 12/28/2009  . GLAUCOMA 12/25/2009  . GERD 01/19/2009  . COPD 11/06/2008  . Abnormal involuntary movement 09/03/2008  . Essential hypertension 06/14/2007  . DM  (diabetes mellitus), type 2 11/06/2006  . DEPRESSION 11/06/2006  . Hyperlipidemia 11/03/2006   Past Medical History  Diagnosis Date  . Diabetes mellitus   . Hypertension   . Hypothyroidism   . COPD (chronic obstructive pulmonary disease)   . Heart murmur   . GERD (gastroesophageal reflux disease)   . 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 Gonzales.  OFFICE NOTE DR. A. LITTLE STATES  "EXTREMELY PACEMAKER SENSITIVE AND WHEN YOU TRY TO CHECK FOR UNDERLYING RHYTHMS SHE WILL HAVE SNYCOPE AND WE HAVE NOT DONE THIS NOW IN ABOUT 2 YEARS".  . Normal coronary arteries 2011  . Complete heart block   . Coronary artery disease   . Shortness of breath dyspnea     walking distance or climbing stairs  . Asthma     Past Surgical History  Procedure Laterality Date  . Pacemaker insertion  1988    last gen 11/08- MDT  . Tubal ligation    . Cardiac catheterization  2011     SHOWED NO CAD-PER CARDIOLOGY OFFICE NOTES DR. A. LITTLE  . Atrioventricular cushion defect repair  1988  . Total knee arthroplasty Right 11/19/2012    Procedure: RIGHT TOTAL KNEE ARTHROPLASTY;  Surgeon: Gearlean Alf, MD;  Location: [facility];  Service: Orthopedics;  Laterality: Right;  . Insert / replace / remove pacemaker    . Colonscopy       removed polyps      Current outpatient prescriptions:  .  albuterol (PROVENTIL HFA;VENTOLIN HFA) 108 (90 BASE) MCG/ACT inhaler, Inhale 2 puffs into the  lungs every 4 (four) hours as needed for wheezing., Disp: 1 Inhaler, Rfl: 2 .  albuterol (PROVENTIL) (2.5 MG/3ML) 0.083% nebulizer solution, Take 3 mLs (2.5 mg total) by nebulization every 4 (four) hours as needed for wheezing. As Needed., Disp: 75 mL, Rfl: 3 .  aspirin (ASPIRIN LOW DOSE) 81 MG EC tablet, Take 1 tablet (81 mg total) by mouth daily. Swallow whole. Order number [id number], Disp: 120 tablet, Rfl: 2 .  busPIRone (BUSPAR) 30 MG tablet, Take 1 tablet (30 mg  total) by mouth 2 (two) times daily as needed (Anxiety). Order number [id number], Disp: 60 tablet, Rfl: 11 .  citalopram (CELEXA) 20 MG tablet, Take 1 tablet (20 mg total) by mouth every morning. Order number [id number], Disp: 30 tablet, Rfl: 5 .  fluticasone (FLOVENT HFA) 110 MCG/ACT inhaler, Inhale 1 puff into the lungs 2 (two) times daily., Disp: 1 Inhaler, Rfl: 5 .  furosemide (LASIX) 20 MG tablet, TAKE 1 TABLET BY MOUTH EVERY MORNING, Disp: 90 tablet, Rfl: 1 .  glipiZIDE (GLUCOTROL XL) 10 MG 24 hr tablet, Take 1 tablet (10 mg total) by mouth daily with breakfast. (Patient taking differently: Take 5 mg by mouth daily with breakfast. ), Disp: 90 tablet, Rfl: 1 .  glycerin adult (GLYCERIN ADULT) 2 G SUPP, Place 1 suppository rectally daily as needed. For constipation, Disp: 10 each, Rfl: 0 .  HYDROcodone-acetaminophen (NORCO/VICODIN) 5-325 MG per tablet, Take 1 tablet by mouth every 6 (six) hours as needed., Disp: 15 tablet, Rfl: 0 .  levothyroxine (SYNTHROID, LEVOTHROID) 75 MCG tablet, Take 1 tablet (75 mcg total) by mouth daily before breakfast. Order number [id number]. Needs thyroid check., Disp: 30 tablet, Rfl: 2 .  losartan (COZAAR) 50 MG tablet, Take 1 tablet (50 mg total) by mouth daily. Order number [id number], Disp: 30 tablet, Rfl: 5 .  LYRICA 75 MG capsule, Take 75 mg by mouth 2 (two) times daily., Disp: , Rfl:  .  omeprazole (PRILOSEC) 20 MG capsule, TAKE 1 CAPSULE BY MOUTH TWICE DAILY, Disp: 180 capsule, Rfl: 0 .  ondansetron (ZOFRAN) 4 MG tablet, Take 1 tablet (4 mg total) by mouth every 8 (eight) hours as needed for nausea., Disp: 20 tablet, Rfl: 0 .  polyethylene glycol powder (GLYCOLAX/MIRALAX) powder, Take 17 g by mouth 2 (two) times daily., Disp: 3350 g, Rfl: 11 .  potassium chloride SA (K-DUR,KLOR-CON) 20 MEQ tablet, Take 1 tablet (20 mEq total) by mouth daily. (Patient taking differently: Take 10 mEq by mouth daily. ), Disp: 90 tablet, Rfl: 1 .  propranolol (INDERAL) 40 MG tablet,  Take 1 tablet (40 mg total) by mouth daily., Disp: 90 tablet, Rfl: 1 .  rosuvastatin (CRESTOR) 20 MG tablet, Take 1 tablet (20 mg total) by mouth daily., Disp: 90 tablet, Rfl: 3 .  sitaGLIPtin (JANUVIA) 100 MG tablet, Take 1 tablet (100 mg total) by mouth every morning., Disp: 90 tablet, Rfl: 1 .  traMADol (ULTRAM) 50 MG tablet, Take 1-2 tablets (50-100 mg total) by mouth every 6 (six) hours as needed., Disp: 80 tablet, Rfl: 1 .  Travoprost, BAK Free, (TRAVATAN) 0.004 % SOLN ophthalmic solution, Place 1 drop into both eyes at bedtime., Disp: 1 Bottle, Rfl: 5 .  traZODone (DESYREL) 50 MG tablet, Take 0.5 tablets (25 mg total) by mouth at bedtime., Disp: 30 tablet, Rfl: 3 .  triamcinolone cream (KENALOG) 0.5 %, APPLY TOPICALLY TO ITCHY AREAS TWICE DAILY AS NEEDED FOR ITCHING UNTIL 2 TO 3 DAYS AFTER HEALED; Order number [id number], Disp: 15 g,  Rfl: 0 .  Calcium Carbonate-Vitamin D (CALCIUM-VITAMIN D) 500-200 MG-UNIT per tablet, Take 1 tablet by mouth daily., Disp: , Rfl:  .  EPINEPHrine (EPI-PEN) 0.3 mg/0.3 mL SOAJ injection, Inject 0.3 mLs (0.3 mg total) into the muscle as needed. For severe allergic reaction, and contact medical provider., Disp: 1 Device, Rfl: 2 .  glucose blood test strip, Use for blood sugar testing 1-3 times daily., Disp: 100 each, Rfl: 11 .  glyBURIDE-metformin (GLUCOVANCE) 5-500 MG per tablet, Take 1 tablet by mouth 2 (two) times daily., Disp: , Rfl:  .  Lancets MISC, Use with blood sugar monitor., Disp: 100 each, Rfl: 11 .  metFORMIN (GLUCOPHAGE) 500 MG tablet, Take 500 mg by mouth 2 (two) times daily with a meal., Disp: , Rfl:  .  Omega-3 Fatty Acids (FISH OIL) 1000 MG CAPS, Take 1 capsule by mouth every morning., Disp: , Rfl:    Allergies  Allergen Reactions  . Chlorhexidine Itching and Rash  . Amitriptyline Hcl     REACTION: itching/rash  . Benazepril Hcl     REACTION: rash  . Naproxen     REACTION: swelling, itching  . Propoxyphene N-Acetaminophen     REACTION:  nausea  . Statins     REACTION: leg pains PT IS ABLE TO TOLERATE CRESTOR    History  Substance Use Topics  . Smoking status: Never Smoker   . Smokeless tobacco: Never Used  . Alcohol Use: No    Family History  Problem Relation Age of Onset  . Heart failure Mother   . Heart failure Sister       Review of Systems  Constitutional: Positive for chills. Negative for fever, weight loss, malaise/fatigue and diaphoresis.  HENT: Negative.   Respiratory: Positive for shortness of breath and wheezing. Negative for cough, hemoptysis and sputum production.        SOB on exertion  Gastrointestinal: Positive for heartburn and constipation. Negative for nausea, vomiting, abdominal pain, diarrhea, blood in stool and melena.  Genitourinary: Negative.   Musculoskeletal: Positive for back pain and joint pain. Negative for myalgias, falls and neck pain.       Right knee pain  Skin: Positive for itching. Negative for rash.  Neurological: Negative.  Negative for weakness.  Endo/Heme/Allergies: Positive for environmental allergies. Negative for polydipsia. Does not bruise/bleed easily.  Psychiatric/Behavioral: Negative.      Objective:  Physical Exam  Constitutional: She is oriented to person, place, and time. She appears well-developed and well-nourished. No distress.  HENT:  Head: Normocephalic and atraumatic.  Right Ear: External ear normal.  Left Ear: External ear normal.  Nose: Nose normal.  Mouth/Throat: Oropharynx is clear and moist.  Eyes: Conjunctivae and EOM are normal.  Neck: Normal range of motion. Neck supple.  Cardiovascular: Normal rate, regular rhythm, normal heart sounds and intact distal pulses.   No murmur heard. Respiratory: Effort normal. No respiratory distress. She has wheezes.  GI: Bowel sounds are normal. She exhibits no distension. There is no tenderness.  Musculoskeletal:       Right hip: Normal.       Left hip: Normal.       Right knee: She exhibits decreased  range of motion and swelling. She exhibits no effusion and no erythema. Tenderness found. Medial joint line and lateral joint line tenderness noted.       Left knee: Normal.       Right lower leg: She exhibits no tenderness and no swelling.  Left lower leg: She exhibits no tenderness and no swelling.  She has quite a hyperreactive pain reaction any time we just touch the knee. There is no warmth about the knee. There is no effusion. Her range is about 0 to 100.  Neurological: She is alert and oriented to person, place, and time. She has normal strength and normal reflexes. No sensory deficit.  Skin: No rash noted. She is not diaphoretic. No erythema.  Psychiatric: She has a normal mood and affect. Her behavior is normal.   Vitals  Weight: 136.4 lb Height: 62.5in Body Surface Area: 1.63 m Body Mass Index: 24.55 kg/m  Pulse: 72 (Regular)  BP: 124/72 (Sitting, Left Arm, Standard)   Imaging Review Plain radiographs demonstrate prosthesis in good position with neutral alignment. However, there is evidence of loosening of the femoral and tibial components on bone scan. The bone quality appears to be good for age and reported activity level.   Assessment/Plan:  End stage primary osteoarthritis, right knee(s) with failed previous arthroplasty.   The patient history, physical examination, clinical judgment of the provider and imaging studies are consistent with end stage degenerative joint disease of the right knee(s), previous total knee arthroplasty. Revision total knee arthroplasty is deemed medically necessary. The treatment options including medical management, injection therapy, arthroscopy and revision arthroplasty were discussed at length. The risks and benefits of revision total knee arthroplasty were presented and reviewed. The risks due to aseptic loosening, infection, stiffness, patella tracking problems, thromboembolic complications and other imponderables were discussed.  The patient acknowledged the explanation, agreed to proceed with the plan and consent was signed. Patient is being admitted for inpatient treatment for surgery, pain control, PT, OT, prophylactic antibiotics, VTE prophylaxis, progressive ambulation and ADL's and discharge planning.The patient is planning to be discharged home with home health services   TXA topical PCP: Dr. Dayna Ramus Cardio: Dr. Thana Farr, PA-C

## 2014-09-19 NOTE — Progress Notes (Addendum)
09-19-14 received fax from Dr. Anne Fu office"no action" for CMP and micro report.

## 2014-09-22 ENCOUNTER — Telehealth: Payer: Self-pay | Admitting: *Deleted

## 2014-09-22 NOTE — Telephone Encounter (Signed)
Signed surgical clearance with a low risk for CV complications with knee surgery faxed to [facility].

## 2014-09-24 ENCOUNTER — Encounter (HOSPITAL_COMMUNITY): Payer: Self-pay | Admitting: *Deleted

## 2014-09-24 ENCOUNTER — Inpatient Hospital Stay (HOSPITAL_COMMUNITY): Payer: Medicare Other | Admitting: Registered Nurse

## 2014-09-24 ENCOUNTER — Inpatient Hospital Stay (HOSPITAL_COMMUNITY)
Admission: RE | Admit: 2014-09-24 | Discharge: 2014-09-27 | DRG: 468 | Disposition: A | Discharge status: Home Health | Payer: Medicare Other | Source: Ambulatory Visit | Attending: Orthopedic Surgery | Admitting: Orthopedic Surgery

## 2014-09-24 ENCOUNTER — Encounter (HOSPITAL_COMMUNITY)
Admission: RE | Disposition: A | Discharge status: Home Health | Payer: Self-pay | Source: Ambulatory Visit | Attending: Orthopedic Surgery

## 2014-09-24 DIAGNOSIS — I10 Essential (primary) hypertension: Secondary | ICD-10-CM | POA: Diagnosis not present

## 2014-09-24 DIAGNOSIS — Z7982 Long term (current) use of aspirin: Secondary | ICD-10-CM | POA: Diagnosis not present

## 2014-09-24 DIAGNOSIS — M25561 Pain in right knee: Secondary | ICD-10-CM | POA: Diagnosis present

## 2014-09-24 DIAGNOSIS — E039 Hypothyroidism, unspecified: Secondary | ICD-10-CM | POA: Diagnosis present

## 2014-09-24 DIAGNOSIS — J449 Chronic obstructive pulmonary disease, unspecified: Secondary | ICD-10-CM | POA: Diagnosis present

## 2014-09-24 DIAGNOSIS — T84032A Mechanical loosening of internal right knee prosthetic joint, initial encounter: Secondary | ICD-10-CM | POA: Diagnosis not present

## 2014-09-24 DIAGNOSIS — Z950 Presence of cardiac pacemaker: Secondary | ICD-10-CM | POA: Diagnosis not present

## 2014-09-24 DIAGNOSIS — I4891 Unspecified atrial fibrillation: Secondary | ICD-10-CM | POA: Diagnosis not present

## 2014-09-24 DIAGNOSIS — M1711 Unilateral primary osteoarthritis, right knee: Secondary | ICD-10-CM | POA: Diagnosis present

## 2014-09-24 DIAGNOSIS — K219 Gastro-esophageal reflux disease without esophagitis: Secondary | ICD-10-CM | POA: Diagnosis not present

## 2014-09-24 DIAGNOSIS — Y838 Other surgical procedures as the cause of abnormal reaction of the patient, or of later complication, without mention of misadventure at the time of the procedure: Secondary | ICD-10-CM | POA: Diagnosis present

## 2014-09-24 DIAGNOSIS — T84012A Broken internal right knee prosthesis, initial encounter: Secondary | ICD-10-CM

## 2014-09-24 DIAGNOSIS — E119 Type 2 diabetes mellitus without complications: Secondary | ICD-10-CM | POA: Diagnosis not present

## 2014-09-24 DIAGNOSIS — E785 Hyperlipidemia, unspecified: Secondary | ICD-10-CM | POA: Diagnosis not present

## 2014-09-24 DIAGNOSIS — T84012D Broken internal right knee prosthesis, subsequent encounter: Secondary | ICD-10-CM

## 2014-09-24 DIAGNOSIS — I2510 Atherosclerotic heart disease of native coronary artery without angina pectoris: Secondary | ICD-10-CM | POA: Diagnosis not present

## 2014-09-24 DIAGNOSIS — Z79899 Other long term (current) drug therapy: Secondary | ICD-10-CM | POA: Diagnosis not present

## 2014-09-24 HISTORY — PX: TOTAL KNEE REVISION: SHX996

## 2014-09-24 LAB — GLUCOSE, CAPILLARY
GLUCOSE-CAPILLARY: 255 mg/dL — AB (ref 70–99)
Glucose-Capillary: 236 mg/dL — ABNORMAL HIGH (ref 70–99)
Glucose-Capillary: 171 mg/dL — ABNORMAL HIGH (ref 70–99)
Glucose-Capillary: 185 mg/dL — ABNORMAL HIGH (ref 70–99)
Glucose-Capillary: 224 mg/dL — ABNORMAL HIGH (ref 70–99)

## 2014-09-24 LAB — TYPE AND SCREEN
ABO/RH(D): O POS
Antibody Screen: NEGATIVE

## 2014-09-24 SURGERY — TOTAL KNEE REVISION
Anesthesia: Spinal | Site: Knee | Laterality: Right

## 2014-09-24 MED ORDER — LOSARTAN POTASSIUM 50 MG PO TABS
50.0000 mg | ORAL_TABLET | Freq: Every day | ORAL | Status: DC
  Administered 2014-09-24: 50 mg via ORAL
  Filled 2014-09-24 (×4): qty 1

## 2014-09-24 MED ORDER — METOCLOPRAMIDE HCL 10 MG PO TABS: 5.0000 mg | ORAL_TABLET | Freq: Three times a day (TID) | ORAL | Status: DC | PRN

## 2014-09-24 MED ORDER — METFORMIN HCL 500 MG PO TABS
500.0000 mg | ORAL_TABLET | Freq: Two times a day (BID) | ORAL | Status: DC
  Filled 2014-09-24 (×3): qty 1

## 2014-09-24 MED ORDER — POTASSIUM CHLORIDE CRYS ER 10 MEQ PO TBCR
10.0000 meq | EXTENDED_RELEASE_TABLET | Freq: Every day | ORAL | Status: DC
Start: 2014-09-24 — End: 2014-09-27
  Administered 2014-09-24 – 2014-09-27 (×3): 10 meq via ORAL
  Filled 2014-09-24 (×4): qty 1

## 2014-09-24 MED ORDER — POLYETHYLENE GLYCOL 3350 17 G PO PACK: 17.0000 g | PACK | Freq: Every day | ORAL | Status: DC | PRN

## 2014-09-24 MED ORDER — GLYBURIDE 5 MG PO TABS
5.0000 mg | ORAL_TABLET | Freq: Two times a day (BID) | ORAL | Status: DC
  Administered 2014-09-25 – 2014-09-27 (×5): 5 mg via ORAL
  Filled 2014-09-24 (×8): qty 1

## 2014-09-24 MED ORDER — ACETAMINOPHEN 500 MG PO TABS
1000.0000 mg | ORAL_TABLET | Freq: Four times a day (QID) | ORAL | Status: AC
  Administered 2014-09-24 – 2014-09-25 (×4): 1000 mg via ORAL
  Filled 2014-09-24 (×4): qty 2

## 2014-09-24 MED ORDER — INSULIN ASPART 100 UNIT/ML ~~LOC~~ SOLN
5.0000 [IU] | Freq: Once | SUBCUTANEOUS | Status: AC
  Administered 2014-09-24: 5 [IU] via SUBCUTANEOUS
  Filled 2014-09-24: qty 1

## 2014-09-24 MED ORDER — INSULIN ASPART 100 UNIT/ML ~~LOC~~ SOLN
0.0000 [IU] | Freq: Three times a day (TID) | SUBCUTANEOUS | Status: DC
  Administered 2014-09-25: 3 [IU] via SUBCUTANEOUS
  Administered 2014-09-25 – 2014-09-26 (×3): 5 [IU] via SUBCUTANEOUS
  Administered 2014-09-26: 3 [IU] via SUBCUTANEOUS
  Administered 2014-09-26: 8 [IU] via SUBCUTANEOUS
  Administered 2014-09-27: 3 [IU] via SUBCUTANEOUS

## 2014-09-24 MED ORDER — PROMETHAZINE HCL 25 MG/ML IJ SOLN: 6.2500 mg | INTRAMUSCULAR | Status: DC | PRN

## 2014-09-24 MED ORDER — SODIUM CHLORIDE 0.9 % IV SOLN: INTRAVENOUS | Status: DC

## 2014-09-24 MED ORDER — PREGABALIN 75 MG PO CAPS
75.0000 mg | ORAL_CAPSULE | Freq: Two times a day (BID) | ORAL | Status: DC
  Administered 2014-09-24 – 2014-09-27 (×6): 75 mg via ORAL
  Filled 2014-09-24 (×6): qty 1

## 2014-09-24 MED ORDER — LINAGLIPTIN 5 MG PO TABS
5.0000 mg | ORAL_TABLET | Freq: Every day | ORAL | Status: DC
  Administered 2014-09-25 – 2014-09-27 (×3): 5 mg via ORAL
  Filled 2014-09-24 (×5): qty 1

## 2014-09-24 MED ORDER — TRANEXAMIC ACID 100 MG/ML IV SOLN
2000.0000 mg | INTRAVENOUS | Status: DC | PRN
  Administered 2014-09-24: 2000 mg via TOPICAL

## 2014-09-24 MED ORDER — ACETAMINOPHEN 650 MG RE SUPP: 650.0000 mg | Freq: Four times a day (QID) | RECTAL | Status: DC | PRN

## 2014-09-24 MED ORDER — DIPHENHYDRAMINE HCL 12.5 MG/5ML PO ELIX: 12.5000 mg | ORAL_SOLUTION | ORAL | Status: DC | PRN

## 2014-09-24 MED ORDER — ALBUTEROL SULFATE (2.5 MG/3ML) 0.083% IN NEBU: 2.5000 mg | INHALATION_SOLUTION | RESPIRATORY_TRACT | Status: DC | PRN

## 2014-09-24 MED ORDER — TRANEXAMIC ACID 100 MG/ML IV SOLN
2000.0000 mg | Freq: Once | INTRAVENOUS | Status: DC
  Filled 2014-09-24: qty 20

## 2014-09-24 MED ORDER — METHOCARBAMOL 500 MG PO TABS
500.0000 mg | ORAL_TABLET | Freq: Four times a day (QID) | ORAL | Status: DC | PRN
  Administered 2014-09-25 – 2014-09-27 (×4): 500 mg via ORAL
  Filled 2014-09-24 (×4): qty 1

## 2014-09-24 MED ORDER — CEFAZOLIN SODIUM-DEXTROSE 2-3 GM-% IV SOLR
2.0000 g | INTRAVENOUS | Status: AC
  Administered 2014-09-24: 2 g via INTRAVENOUS

## 2014-09-24 MED ORDER — SODIUM CHLORIDE 0.9 % IR SOLN
Status: DC | PRN
  Administered 2014-09-24: 1000 mL

## 2014-09-24 MED ORDER — PROPRANOLOL HCL 40 MG PO TABS
40.0000 mg | ORAL_TABLET | Freq: Every day | ORAL | Status: DC
  Administered 2014-09-25 – 2014-09-27 (×3): 40 mg via ORAL
  Filled 2014-09-24 (×3): qty 1

## 2014-09-24 MED ORDER — METOCLOPRAMIDE HCL 5 MG/ML IJ SOLN: 5.0000 mg | Freq: Three times a day (TID) | INTRAMUSCULAR | Status: DC | PRN

## 2014-09-24 MED ORDER — HYDROMORPHONE HCL 2 MG PO TABS
2.0000 mg | ORAL_TABLET | ORAL | Status: DC | PRN
  Administered 2014-09-24: 4 mg via ORAL
  Administered 2014-09-24: 2 mg via ORAL
  Administered 2014-09-25 (×2): 4 mg via ORAL
  Administered 2014-09-25 – 2014-09-26 (×4): 2 mg via ORAL
  Filled 2014-09-24: qty 2
  Filled 2014-09-24: qty 1
  Filled 2014-09-24: qty 2
  Filled 2014-09-24 (×2): qty 1
  Filled 2014-09-24: qty 2
  Filled 2014-09-24 (×2): qty 1

## 2014-09-24 MED ORDER — DEXAMETHASONE SODIUM PHOSPHATE 10 MG/ML IJ SOLN
10.0000 mg | Freq: Once | INTRAMUSCULAR | Status: DC
  Filled 2014-09-24: qty 1

## 2014-09-24 MED ORDER — ONDANSETRON HCL 4 MG/2ML IJ SOLN
INTRAMUSCULAR | Status: AC
  Filled 2014-09-24: qty 2

## 2014-09-24 MED ORDER — FENTANYL CITRATE 0.05 MG/ML IJ SOLN
INTRAMUSCULAR | Status: AC
  Filled 2014-09-24: qty 2

## 2014-09-24 MED ORDER — HYDROMORPHONE HCL 1 MG/ML IJ SOLN
0.5000 mg | INTRAMUSCULAR | Status: DC | PRN
Start: 2014-09-24 — End: 2014-09-27
  Administered 2014-09-24 – 2014-09-25 (×2): 1 mg via INTRAVENOUS
  Filled 2014-09-24 (×2): qty 1

## 2014-09-24 MED ORDER — POVIDONE-IODINE 7.5 % EX SOLN: Freq: Once | CUTANEOUS | Status: DC

## 2014-09-24 MED ORDER — LACTATED RINGERS IV SOLN
INTRAVENOUS | Status: DC
Start: 2014-09-24 — End: 2014-09-24
  Administered 2014-09-24: 15:00:00 via INTRAVENOUS
  Administered 2014-09-24: 1000 mL via INTRAVENOUS
  Administered 2014-09-24: 16:00:00 via INTRAVENOUS

## 2014-09-24 MED ORDER — TRAZODONE 25 MG HALF TABLET
25.0000 mg | ORAL_TABLET | Freq: Every day | ORAL | Status: DC
  Administered 2014-09-24 – 2014-09-27 (×3): 25 mg via ORAL
  Filled 2014-09-24 (×4): qty 1

## 2014-09-24 MED ORDER — LATANOPROST 0.005 % OP SOLN
1.0000 [drp] | Freq: Every day | OPHTHALMIC | Status: DC
  Administered 2014-09-24 – 2014-09-26 (×3): 1 [drp] via OPHTHALMIC
  Filled 2014-09-24: qty 2.5

## 2014-09-24 MED ORDER — MIDAZOLAM HCL 5 MG/5ML IJ SOLN
INTRAMUSCULAR | Status: DC | PRN
  Administered 2014-09-24 (×2): 1 mg via INTRAVENOUS

## 2014-09-24 MED ORDER — BUPIVACAINE HCL 0.25 % IJ SOLN
INTRAMUSCULAR | Status: DC | PRN
  Administered 2014-09-24: 20 mL

## 2014-09-24 MED ORDER — BISACODYL 10 MG RE SUPP: 10.0000 mg | Freq: Every day | RECTAL | Status: DC | PRN

## 2014-09-24 MED ORDER — BUSPIRONE HCL 15 MG PO TABS
30.0000 mg | ORAL_TABLET | Freq: Two times a day (BID) | ORAL | Status: DC | PRN
  Filled 2014-09-24: qty 2

## 2014-09-24 MED ORDER — BUPIVACAINE LIPOSOME 1.3 % IJ SUSP
INTRAMUSCULAR | Status: DC | PRN
  Administered 2014-09-24: 50 mL

## 2014-09-24 MED ORDER — PROPOFOL INFUSION 10 MG/ML OPTIME
INTRAVENOUS | Status: DC | PRN
  Administered 2014-09-24: 100 ug/kg/min via INTRAVENOUS

## 2014-09-24 MED ORDER — BUPIVACAINE IN DEXTROSE 0.75-8.25 % IT SOLN
INTRATHECAL | Status: DC | PRN
  Administered 2014-09-24: 1.8 mL via INTRATHECAL

## 2014-09-24 MED ORDER — DEXAMETHASONE SODIUM PHOSPHATE 10 MG/ML IJ SOLN
10.0000 mg | Freq: Once | INTRAMUSCULAR | Status: AC
  Administered 2014-09-24: 10 mg via INTRAVENOUS

## 2014-09-24 MED ORDER — FENTANYL CITRATE 0.05 MG/ML IJ SOLN
INTRAMUSCULAR | Status: DC | PRN
  Administered 2014-09-24: 25 ug via INTRAVENOUS
  Administered 2014-09-24: 50 ug via INTRAVENOUS
  Administered 2014-09-24: 25 ug via INTRAVENOUS

## 2014-09-24 MED ORDER — DEXAMETHASONE SODIUM PHOSPHATE 10 MG/ML IJ SOLN
INTRAMUSCULAR | Status: AC
  Filled 2014-09-24: qty 1

## 2014-09-24 MED ORDER — HYDROMORPHONE HCL 1 MG/ML IJ SOLN
INTRAMUSCULAR | Status: AC
  Filled 2014-09-24: qty 1

## 2014-09-24 MED ORDER — FLEET ENEMA 7-19 GM/118ML RE ENEM: 1.0000 | ENEMA | Freq: Once | RECTAL | Status: AC | PRN

## 2014-09-24 MED ORDER — CEFAZOLIN SODIUM-DEXTROSE 2-3 GM-% IV SOLR
INTRAVENOUS | Status: AC
  Filled 2014-09-24: qty 50

## 2014-09-24 MED ORDER — PROPOFOL 10 MG/ML IV BOLUS
INTRAVENOUS | Status: AC
  Filled 2014-09-24: qty 20

## 2014-09-24 MED ORDER — LEVOTHYROXINE SODIUM 75 MCG PO TABS
75.0000 ug | ORAL_TABLET | Freq: Every day | ORAL | Status: DC
Start: 2014-09-25 — End: 2014-09-27
  Administered 2014-09-25 – 2014-09-27 (×3): 75 ug via ORAL
  Filled 2014-09-24 (×5): qty 1

## 2014-09-24 MED ORDER — PANTOPRAZOLE SODIUM 40 MG PO TBEC
40.0000 mg | DELAYED_RELEASE_TABLET | Freq: Two times a day (BID) | ORAL | Status: DC
  Filled 2014-09-24 (×2): qty 1

## 2014-09-24 MED ORDER — ACETAMINOPHEN 325 MG PO TABS: 650.0000 mg | ORAL_TABLET | Freq: Four times a day (QID) | ORAL | Status: DC | PRN

## 2014-09-24 MED ORDER — PHENOL 1.4 % MT LIQD
1.0000 | OROMUCOSAL | Status: DC | PRN
  Filled 2014-09-24: qty 177

## 2014-09-24 MED ORDER — PROPOFOL 10 MG/ML IV BOLUS: INTRAVENOUS | Status: DC | PRN

## 2014-09-24 MED ORDER — BUPIVACAINE HCL (PF) 0.25 % IJ SOLN
INTRAMUSCULAR | Status: AC
  Filled 2014-09-24: qty 30

## 2014-09-24 MED ORDER — GLYBURIDE-METFORMIN 5-500 MG PO TABS: 1.0000 | ORAL_TABLET | Freq: Two times a day (BID) | ORAL | Status: DC

## 2014-09-24 MED ORDER — ACETAMINOPHEN 10 MG/ML IV SOLN
1000.0000 mg | Freq: Once | INTRAVENOUS | Status: AC
  Administered 2014-09-24: 1000 mg via INTRAVENOUS
  Filled 2014-09-24: qty 100

## 2014-09-24 MED ORDER — CEFAZOLIN SODIUM 1-5 GM-% IV SOLN
1.0000 g | Freq: Four times a day (QID) | INTRAVENOUS | Status: AC
Start: 2014-09-24 — End: 2014-09-25
  Administered 2014-09-24 – 2014-09-25 (×2): 1 g via INTRAVENOUS
  Filled 2014-09-24 (×2): qty 50

## 2014-09-24 MED ORDER — ALBUTEROL SULFATE (2.5 MG/3ML) 0.083% IN NEBU: 3.0000 mL | INHALATION_SOLUTION | RESPIRATORY_TRACT | Status: DC | PRN

## 2014-09-24 MED ORDER — ROSUVASTATIN CALCIUM 20 MG PO TABS
20.0000 mg | ORAL_TABLET | Freq: Every day | ORAL | Status: DC
  Administered 2014-09-25 – 2014-09-27 (×3): 20 mg via ORAL
  Filled 2014-09-24 (×4): qty 1

## 2014-09-24 MED ORDER — SODIUM CHLORIDE 0.9 % IJ SOLN
INTRAMUSCULAR | Status: AC
  Filled 2014-09-24: qty 50

## 2014-09-24 MED ORDER — ONDANSETRON HCL 4 MG PO TABS: 4.0000 mg | ORAL_TABLET | Freq: Four times a day (QID) | ORAL | Status: DC | PRN

## 2014-09-24 MED ORDER — MEPERIDINE HCL 50 MG/ML IJ SOLN: 6.2500 mg | INTRAMUSCULAR | Status: DC | PRN

## 2014-09-24 MED ORDER — METHOCARBAMOL 1000 MG/10ML IJ SOLN
500.0000 mg | Freq: Four times a day (QID) | INTRAVENOUS | Status: DC | PRN
  Administered 2014-09-24: 500 mg via INTRAVENOUS
  Filled 2014-09-24 (×2): qty 5

## 2014-09-24 MED ORDER — BUPIVACAINE LIPOSOME 1.3 % IJ SUSP
20.0000 mL | Freq: Once | INTRAMUSCULAR | Status: DC
  Filled 2014-09-24: qty 20

## 2014-09-24 MED ORDER — FLUTICASONE PROPIONATE HFA 110 MCG/ACT IN AERO
1.0000 | INHALATION_SPRAY | Freq: Two times a day (BID) | RESPIRATORY_TRACT | Status: DC
  Administered 2014-09-24 – 2014-09-27 (×6): 1 via RESPIRATORY_TRACT
  Filled 2014-09-24: qty 12

## 2014-09-24 MED ORDER — FUROSEMIDE 20 MG PO TABS
20.0000 mg | ORAL_TABLET | Freq: Every day | ORAL | Status: DC
  Administered 2014-09-24: 20 mg via ORAL
  Filled 2014-09-24 (×4): qty 1

## 2014-09-24 MED ORDER — MENTHOL 3 MG MT LOZG
1.0000 | LOZENGE | OROMUCOSAL | Status: DC | PRN
  Filled 2014-09-24: qty 9

## 2014-09-24 MED ORDER — CITALOPRAM HYDROBROMIDE 20 MG PO TABS
20.0000 mg | ORAL_TABLET | Freq: Every morning | ORAL | Status: DC
  Administered 2014-09-25 – 2014-09-27 (×3): 20 mg via ORAL
  Filled 2014-09-24 (×3): qty 1

## 2014-09-24 MED ORDER — SODIUM CHLORIDE 0.9 % IJ SOLN
INTRAMUSCULAR | Status: DC | PRN
  Administered 2014-09-24: 30 mL

## 2014-09-24 MED ORDER — ONDANSETRON HCL 4 MG/2ML IJ SOLN: 4.0000 mg | Freq: Four times a day (QID) | INTRAMUSCULAR | Status: DC | PRN

## 2014-09-24 MED ORDER — ONDANSETRON HCL 4 MG/2ML IJ SOLN
INTRAMUSCULAR | Status: DC | PRN
  Administered 2014-09-24: 4 mg via INTRAVENOUS

## 2014-09-24 MED ORDER — RIVAROXABAN 10 MG PO TABS
10.0000 mg | ORAL_TABLET | Freq: Every day | ORAL | Status: DC
  Administered 2014-09-25 – 2014-09-27 (×3): 10 mg via ORAL
  Filled 2014-09-24 (×5): qty 1

## 2014-09-24 MED ORDER — POTASSIUM CHLORIDE IN NACL 20-0.9 MEQ/L-% IV SOLN
INTRAVENOUS | Status: DC
  Administered 2014-09-24 – 2014-09-27 (×3): via INTRAVENOUS
  Filled 2014-09-24 (×3): qty 1000

## 2014-09-24 MED ORDER — MIDAZOLAM HCL 2 MG/2ML IJ SOLN
INTRAMUSCULAR | Status: AC
  Filled 2014-09-24: qty 2

## 2014-09-24 MED ORDER — 0.9 % SODIUM CHLORIDE (POUR BTL) OPTIME
TOPICAL | Status: DC | PRN
  Administered 2014-09-24: 1000 mL

## 2014-09-24 MED ORDER — GLIPIZIDE ER 5 MG PO TB24
5.0000 mg | ORAL_TABLET | Freq: Every day | ORAL | Status: DC
  Administered 2014-09-25 – 2014-09-27 (×3): 5 mg via ORAL
  Filled 2014-09-24 (×5): qty 1

## 2014-09-24 MED ORDER — HYDROMORPHONE HCL 1 MG/ML IJ SOLN
0.2500 mg | INTRAMUSCULAR | Status: DC | PRN
  Administered 2014-09-24 (×2): 0.5 mg via INTRAVENOUS

## 2014-09-24 MED ORDER — DOCUSATE SODIUM 100 MG PO CAPS
100.0000 mg | ORAL_CAPSULE | Freq: Two times a day (BID) | ORAL | Status: DC
  Administered 2014-09-24 – 2014-09-27 (×5): 100 mg via ORAL

## 2014-09-24 SURGICAL SUPPLY — 70 items
ADAPTER BOLT FEMORAL +2/-2 (Knees) ×3 IMPLANT
BAG ZIPLOCK 12X15 (MISCELLANEOUS) IMPLANT
BANDAGE ELASTIC 6 VELCRO ST LF (GAUZE/BANDAGES/DRESSINGS) ×3 IMPLANT
BANDAGE ESMARK 6X9 LF (GAUZE/BANDAGES/DRESSINGS) ×1 IMPLANT
BLADE SAG 18X100X1.27 (BLADE) ×3 IMPLANT
BLADE SAW SGTL 11.0X1.19X90.0M (BLADE) ×3 IMPLANT
BNDG ESMARK 6X9 LF (GAUZE/BANDAGES/DRESSINGS) ×3
BONE CEMENT GENTAMICIN (Cement) ×3 IMPLANT
CEMENT BONE GENTAMICIN 40 (Cement) ×1 IMPLANT
CEMENT RESTRICTOR DEPUY SZ 4 (Cement) ×3 IMPLANT
CUFF TOURN SGL QUICK 34 (TOURNIQUET CUFF) ×2
CUFF TRNQT CYL 34X4X40X1 (TOURNIQUET CUFF) ×1 IMPLANT
DRAPE EXTREMITY T 121X128X90 (DRAPE) ×3 IMPLANT
DRAPE POUCH INSTRU U-SHP 10X18 (DRAPES) ×3 IMPLANT
DRAPE U-SHAPE 47X51 STRL (DRAPES) ×3 IMPLANT
DRSG ADAPTIC 3X8 NADH LF (GAUZE/BANDAGES/DRESSINGS) ×3 IMPLANT
DRSG PAD ABDOMINAL 8X10 ST (GAUZE/BANDAGES/DRESSINGS) ×3 IMPLANT
DURAPREP 26ML APPLICATOR (WOUND CARE) ×3 IMPLANT
ELECT REM PT RETURN 9FT ADLT (ELECTROSURGICAL) ×3
ELECTRODE REM PT RTRN 9FT ADLT (ELECTROSURGICAL) ×1 IMPLANT
EVACUATOR 1/8 PVC DRAIN (DRAIN) ×3 IMPLANT
FACESHIELD WRAPAROUND (MASK) ×15 IMPLANT
FEM TC3 RT PFC SIGMA SZ2.5 (Orthopedic Implant) ×3 IMPLANT
FEMORAL ADAPTER (Orthopedic Implant) ×3 IMPLANT
FEMORAL TC3 RT PFC SIGMA SZ2.5 (Orthopedic Implant) ×1 IMPLANT
GAUZE SPONGE 4X4 12PLY STRL (GAUZE/BANDAGES/DRESSINGS) ×3 IMPLANT
GLOVE BIO SURGEON STRL SZ7.5 (GLOVE) ×15 IMPLANT
GLOVE BIO SURGEON STRL SZ8 (GLOVE) ×3 IMPLANT
GLOVE BIOGEL PI IND STRL 8 (GLOVE) ×1 IMPLANT
GLOVE BIOGEL PI INDICATOR 8 (GLOVE) ×2
GLOVE SURG SS PI 6.5 STRL IVOR (GLOVE) IMPLANT
GOWN STRL REUS W/TWL LRG LVL3 (GOWN DISPOSABLE) ×3 IMPLANT
GOWN STRL REUS W/TWL XL LVL3 (GOWN DISPOSABLE) ×9 IMPLANT
HANDPIECE INTERPULSE COAX TIP (DISPOSABLE) ×2
IMMOBILIZER KNEE 20 (SOFTGOODS) ×3
IMMOBILIZER KNEE 20 THIGH 36 (SOFTGOODS) ×1 IMPLANT
INSERT TC3 RP TIB  2.5 17.5MM (Knees) ×2 IMPLANT
INSERT TC3 RP TIB 2.5 17.5MM (Knees) ×1 IMPLANT
KIT BASIN OR (CUSTOM PROCEDURE TRAY) ×3 IMPLANT
MANIFOLD NEPTUNE II (INSTRUMENTS) ×3 IMPLANT
NDL SAFETY ECLIPSE 18X1.5 (NEEDLE) ×3 IMPLANT
NEEDLE HYPO 18GX1.5 SHARP (NEEDLE) ×6
NS IRRIG 1000ML POUR BTL (IV SOLUTION) ×3 IMPLANT
PACK TOTAL JOINT (CUSTOM PROCEDURE TRAY) IMPLANT
PADDING CAST COTTON 6X4 STRL (CAST SUPPLIES) ×6 IMPLANT
PEN SKIN MARKING BROAD (MISCELLANEOUS) ×3 IMPLANT
POSITIONER SURGICAL ARM (MISCELLANEOUS) ×3 IMPLANT
SET HNDPC FAN SPRY TIP SCT (DISPOSABLE) ×1 IMPLANT
STAPLER VISISTAT 35W (STAPLE) ×3 IMPLANT
STEM TIBIA PFC 13X30MM (Stem) ×3 IMPLANT
STEM UNIVERSAL REVISION 75X16 (Stem) ×3 IMPLANT
SUCTION FRAZIER 12FR DISP (SUCTIONS) ×3 IMPLANT
SUT MNCRL AB 4-0 PS2 18 (SUTURE) ×3 IMPLANT
SUT VIC AB 2-0 CT1 27 (SUTURE) ×6
SUT VIC AB 2-0 CT1 TAPERPNT 27 (SUTURE) ×3 IMPLANT
SUT VLOC 180 0 24IN GS25 (SUTURE) ×3 IMPLANT
SWAB COLLECTION DEVICE MRSA (MISCELLANEOUS) IMPLANT
SYR 20CC LL (SYRINGE) ×3 IMPLANT
SYR 50ML LL SCALE MARK (SYRINGE) ×6 IMPLANT
TOWEL OR 17X26 10 PK STRL BLUE (TOWEL DISPOSABLE) ×3 IMPLANT
TOWEL OR NON WOVEN STRL DISP B (DISPOSABLE) ×3 IMPLANT
TOWER CARTRIDGE SMART MIX (DISPOSABLE) ×3 IMPLANT
TRAY FOLEY CATH 14FRSI W/METER (CATHETERS) ×3 IMPLANT
TRAY REVISION SZ 2.5 (Knees) ×3 IMPLANT
TRAY SLEEVE CEM ML (Knees) ×3 IMPLANT
TUBE ANAEROBIC SPECIMEN COL (MISCELLANEOUS) IMPLANT
TUBE KAMVAC SUCTION (TUBING) IMPLANT
WATER STERILE IRR 1500ML POUR (IV SOLUTION) ×3 IMPLANT
WRAP KNEE MAXI GEL POST OP (GAUZE/BANDAGES/DRESSINGS) ×3 IMPLANT
YANKAUER SUCT BULB TIP 10FT TU (MISCELLANEOUS) ×3 IMPLANT

## 2014-09-24 NOTE — Brief Op Note (Signed)
09/24/2014  4:04 PM  PATIENT:  Mercedes Barr  69 y.o. female  PRE-OPERATIVE DIAGNOSIS:  loose right total knee arthroplasty  POST-OPERATIVE DIAGNOSIS:  loose right total knee arthroplasty  PROCEDURE:  Procedure(s): RIGHT TOTAL KNEE ARTHROPLASTY REVISION (Right)  SURGEON:  Surgeon(s) and Role:    Gearlean Alf, MD - Primary  PHYSICIAN ASSISTANT:   ASSISTANTS: [name], PA-C   ANESTHESIA:   general  EBL:  Total I/O In: 1000 [I.V.:1000] Out: 43 [Blood:50]  DRAINS: (Medium) Hemovact drain(s) in the right knee with  Suction Open   LOCAL MEDICATIONS USED:  OTHER Exparel  COUNTS:  YES  TOURNIQUET:   Total Tourniquet Time Documented: Thigh (Right) - 33 minutes Thigh (Right) - 19 minutes Total: Thigh (Right) - 52 minutes   DICTATION: .Other Dictation: Dictation Number [id number]  PLAN OF CARE: Admit to inpatient   PATIENT DISPOSITION:  PACU - hemodynamically stable.

## 2014-09-24 NOTE — Anesthesia Procedure Notes (Signed)
Spinal Patient location during procedure: OR Start time: 09/24/2014 2:32 PM End time: 09/24/2014 2:37 PM Staffing Resident/CRNA: Destynie Toomey G Performed by: resident/CRNA  Preanesthetic Checklist Completed: patient identified, site marked, surgical consent, pre-op evaluation, timeout performed, IV checked, risks and benefits discussed and monitors and equipment checked Spinal Block Patient position: sitting Prep: Betadine Patient monitoring: heart rate, cardiac monitor, continuous pulse ox and blood pressure Approach: midline Location: L3-4 Injection technique: single-shot Needle Needle type: Spinocan  Needle gauge: 22 G Needle length: 9 cm Needle insertion depth: 4 cm Assessment Sensory level: T6

## 2014-09-24 NOTE — Transfer of Care (Signed)
Immediate Anesthesia Transfer of Care Note  Patient: Mercedes Barr  Procedure(s) Performed: Procedure(s): RIGHT TOTAL KNEE ARTHROPLASTY REVISION (Right)  Patient Location: PACU  Anesthesia Type:Spinal  Level of Consciousness: awake, alert  and oriented  Airway & Oxygen Therapy: Patient Spontanous Breathing and Patient connected to face mask oxygen  Post-op Assessment: Report given to RN and Post -op Vital signs reviewed and stable  Post vital signs: Reviewed and stable  Last Vitals:  Filed Vitals:   09/24/14 1156  BP: 129/67  Pulse: 81  Temp: 36.4 C  Resp: 18    Complications: No apparent anesthesia complications

## 2014-09-24 NOTE — Interval H&P Note (Signed)
History and Physical Interval Note:  09/24/2014 2:24 PM  Mercedes Barr  has presented today for surgery, with the diagnosis of loose right total knee arthroplasty  The various methods of treatment have been discussed with the patient and family. After consideration of risks, benefits and other options for treatment, the patient has consented to  Procedure(s): RIGHT TOTAL KNEE ARTHROPLASTY REVISION (Right) as a surgical intervention .  The patient's history has been reviewed, patient examined, no change in status, stable for surgery.  I have reviewed the patient's chart and labs.  Questions were answered to the patient's satisfaction.     Gearlean Alf

## 2014-09-24 NOTE — Anesthesia Postprocedure Evaluation (Signed)
Anesthesia Post Note  Patient: Mercedes Barr  Procedure(s) Performed: Procedure(s) (LRB): RIGHT TOTAL KNEE ARTHROPLASTY REVISION (Right)  Anesthesia type: Spinal  Patient location: PACU  Post pain: Pain level controlled  Post assessment: Post-op Vital signs reviewed  Last Vitals: BP 146/46 mmHg  Pulse 69  Temp(Src) 36.3 C (Oral)  Resp 16  Ht 5\' 2"  (1.575 m)  Wt 137 lb (62.143 kg)  BMI 25.05 kg/m2  SpO2 100%  Post vital signs: Reviewed  Level of consciousness: sedated  Complications: No apparent anesthesia complications

## 2014-09-24 NOTE — Plan of Care (Signed)
Problem: Consults Goal: Diagnosis- Total Joint Replacement Revision right total knee

## 2014-09-24 NOTE — Anesthesia Preprocedure Evaluation (Addendum)
Anesthesia Evaluation  Patient identified by MRN, date of birth, ID band Patient awake    Reviewed: Allergy & Precautions, H&P , NPO status , Patient's Chart, lab work & pertinent test results, reviewed documented beta blocker date and time   Airway Mallampati: II  TM Distance: >3 FB Neck ROM: Full    Dental  (+) Edentulous Upper, Dental Advisory Given   Pulmonary shortness of breath, asthma , COPD breath sounds clear to auscultation  Pulmonary exam normal       Cardiovascular hypertension, Pt. on home beta blockers and Pt. on medications + CAD + dysrhythmias Atrial Fibrillation + pacemaker (Patient rate is pacemaker dependent) + Valvular Problems/Murmurs Rhythm:Regular Rate:Normal  [employer] pacemaker for complete HB Last interrogation 04/2014 Pt is pacer dependent  AS-VP 11% AP-VP 89% 100% V paced Longevity 2.5 years   Neuro/Psych PSYCHIATRIC DISORDERS Depression negative neurological ROS     GI/Hepatic Neg liver ROS, GERD-  Medicated,  Endo/Other  diabetes, Poorly Controlled, Type 2, Oral Hypoglycemic AgentsHypothyroidism   Renal/GU negative Renal ROS     Musculoskeletal  (+) Arthritis -,   Abdominal   Peds  Hematology negative hematology ROS (+)   Anesthesia Other Findings   Reproductive/Obstetrics negative OB ROS                           Anesthesia Physical  Anesthesia Plan  ASA: III  Anesthesia Plan: Spinal   Post-op Pain Management:    Induction: Intravenous  Airway Management Planned: Simple Face Mask  Additional Equipment:   Intra-op Plan:   Post-operative Plan:   Informed Consent: I have reviewed the patients History and Physical, chart, labs and discussed the procedure including the risks, benefits and alternatives for the proposed anesthesia with the patient or authorized representative who has indicated his/her understanding and acceptance.   Dental advisory  given  Plan Discussed with: CRNA  Anesthesia Plan Comments:         Anesthesia Quick Evaluation

## 2014-09-25 ENCOUNTER — Encounter (HOSPITAL_COMMUNITY): Payer: Self-pay | Admitting: Orthopedic Surgery

## 2014-09-25 LAB — CBC
HCT: 32.5 % — ABNORMAL LOW (ref 36.0–46.0)
Hemoglobin: 10.7 g/dL — ABNORMAL LOW (ref 12.0–15.0)
MCH: 27.9 pg (ref 26.0–34.0)
MCHC: 32.9 g/dL (ref 30.0–36.0)
MCV: 84.6 fL (ref 78.0–100.0)
PLATELETS: 206 10*3/uL (ref 150–400)
RBC: 3.84 MIL/uL — ABNORMAL LOW (ref 3.87–5.11)
RDW: 12.5 % (ref 11.5–15.5)
WBC: 9 10*3/uL (ref 4.0–10.5)

## 2014-09-25 LAB — GLUCOSE, CAPILLARY
GLUCOSE-CAPILLARY: 210 mg/dL — AB (ref 70–99)
GLUCOSE-CAPILLARY: 195 mg/dL — AB (ref 70–99)
Glucose-Capillary: 248 mg/dL — ABNORMAL HIGH (ref 70–99)
Glucose-Capillary: 219 mg/dL — ABNORMAL HIGH (ref 70–99)
Glucose-Capillary: 226 mg/dL — ABNORMAL HIGH (ref 70–99)

## 2014-09-25 LAB — BASIC METABOLIC PANEL
ANION GAP: 10 (ref 5–15)
BUN: 11 mg/dL (ref 6–23)
CALCIUM: 8.7 mg/dL (ref 8.4–10.5)
CO2: 29 mmol/L (ref 19–32)
Chloride: 98 mmol/L (ref 96–112)
Creatinine, Ser: 0.69 mg/dL (ref 0.50–1.10)
GFR calc Af Amer: >90 mL/min (ref >90)
GFR, EST NON AFRICAN AMERICAN: 88 mL/min — AB (ref >90)
GLUCOSE: 260 mg/dL — AB (ref 70–99)
Potassium: 4.1 mmol/L (ref 3.5–5.1)
Sodium: 137 mmol/L (ref 135–145)

## 2014-09-25 MED ORDER — SODIUM CHLORIDE 0.9 % IV BOLUS (SEPSIS)
500.0000 mL | Freq: Once | INTRAVENOUS | Status: AC
  Administered 2014-09-25: 500 mL via INTRAVENOUS

## 2014-09-25 MED ORDER — NON FORMULARY: 20.0000 mg | Freq: Two times a day (BID) | Status: DC

## 2014-09-25 MED ORDER — OMEPRAZOLE 20 MG PO CPDR
20.0000 mg | DELAYED_RELEASE_CAPSULE | Freq: Two times a day (BID) | ORAL | Status: DC
  Administered 2014-09-25 – 2014-09-27 (×5): 20 mg via ORAL
  Filled 2014-09-25 (×7): qty 1

## 2014-09-25 MED ORDER — SODIUM CHLORIDE 0.9 % IV BOLUS (SEPSIS)
250.0000 mL | Freq: Once | INTRAVENOUS | Status: AC
  Administered 2014-09-25: 250 mL via INTRAVENOUS

## 2014-09-25 NOTE — Progress Notes (Signed)
   Subjective: 1 Day Post-Op Procedure(s) (LRB): RIGHT TOTAL KNEE ARTHROPLASTY REVISION (Right) Patient reports pain as mild.   Patient seen in rounds with Dr. Wynelle Link.  Her main complaint is her back hurting.  She has some preexisting back issues which were hurting even before admission.  Will need to get her up and moving today. Patient is well, but has had some minor complaints of pain in the knee and back, requiring pain medications We will start therapy today.  Plan is to go Home after hospital stay.  Objective: Vital signs in last 24 hours: Temp:  [97.3 F (36.3 C)-98.2 F (36.8 C)] 98.2 F (36.8 C) (02/18 0555) Pulse Rate:  [69-81] 70 (02/18 0555) Resp:  [12-20] 16 (02/18 0555) BP: (105-160)/(45-86) 105/45 mmHg (02/18 0555) SpO2:  [99 %-100 %] 100 % (02/18 0555) FiO2 (%):  [2 %] 2 % (02/17 1730) Weight:  [62.143 kg (137 lb)] 62.143 kg (137 lb) (02/17 1730)  Intake/Output from previous day:  Intake/Output Summary (Last 24 hours) at 09/25/14 0735 Last data filed at 09/25/14 0700  Gross per 24 hour  Intake 3952.5 ml  Output   2090 ml  Net 1862.5 ml    Intake/Output this shift: UOP 950 since MN  Labs:  Recent Labs  09/25/14 0517  HGB 10.7*    Recent Labs  09/25/14 0517  WBC 9.0  RBC 3.84*  HCT 32.5*  PLT 206    Recent Labs  09/25/14 0517  NA 137  K 4.1  CL 98  CO2 29  BUN 11  CREATININE 0.69  GLUCOSE 260*  CALCIUM 8.7   No results for input(s): LABPT, INR in the last 72 hours.  EXAM General - Patient is Alert and Appropriate Extremity - Neurovascular intact Sensation intact distally Dorsiflexion/Plantar flexion intact Dressing - dressing C/D/I Motor Function - intact, moving foot and toes well on exam.  Hemovac pulled without difficulty.  Past Medical History  Diagnosis Date  . Diabetes mellitus   . Hypertension   . Hypothyroidism   . COPD (chronic obstructive pulmonary disease)   . Heart murmur   . GERD (gastroesophageal reflux  disease)   . 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 [place].  OFFICE NOTE DR. A. LITTLE STATES  "EXTREMELY PACEMAKER SENSITIVE AND WHEN YOU TRY TO CHECK FOR UNDERLYING RHYTHMS SHE WILL HAVE SNYCOPE AND WE HAVE NOT DONE THIS NOW IN ABOUT 2 YEARS".  . Normal coronary arteries 2011  . Complete heart block   . Coronary artery disease   . Shortness of breath dyspnea     walking distance or climbing stairs  . Asthma     Assessment/Plan: 1 Day Post-Op Procedure(s) (LRB): RIGHT TOTAL KNEE ARTHROPLASTY REVISION (Right) Principal Problem:   Failed total right knee replacement Active Problems:   OA (osteoarthritis) of knee  Estimated body mass index is 25.05 kg/(m^2) as calculated from the following:   Height as of this encounter: 5\' 2"  (1.575 m).   Weight as of this encounter: 62.143 kg (137 lb). Advance diet Up with therapy Plan for discharge tomorrow Discharge home with home health  DVT Prophylaxis - Xarelto Weight-Bearing as tolerated to right leg D/C O2 and Pulse OX and try on Room Air  [name], PA-C Orthopaedic Surgery 09/25/2014, 7:35 AM

## 2014-09-25 NOTE — Progress Notes (Signed)
Utilization review completed.  

## 2014-09-25 NOTE — Op Note (Signed)
Mercedes Barr, Barr               ACCOUNT NO.:  [id number]  MEDICAL RECORD NO.:  [id number]  LOCATION:                                 FACILITY:  PHYSICIAN:  Gaynelle Arabian, M.D.    DATE OF BIRTH:  December 04, 1945  DATE OF PROCEDURE:  09/24/2014 DATE OF DISCHARGE:                              OPERATIVE REPORT   PREOPERATIVE DIAGNOSIS:  Failed loose right total knee arthroplasty.  POSTOPERATIVE DIAGNOSIS:  Failed loose right total knee arthroplasty.  PROCEDURE:  Right total knee arthroplasty revision.  SURGEON:  Gaynelle Arabian, M.D.  ASSISTANT:  Alexzandrew L. Perkins, P.A.C.  ANESTHESIA:  Spinal.  ESTIMATED BLOOD LOSS:  Minimal.  DRAINS:  Hemovac x1.  COMPLICATIONS:  None.  CONDITION:  Stable to recovery.  BRIEF CLINICAL NOTE:  Ms. Mercedes Barr is a 69 year old female, who underwent a primary right total knee arthroplasty about a year and a half ago.  Did very well initially and then had an episode where she started to develop increased pain.  She has had progressively worsening pain since.  Her blood work was negative for any infection.  She had a bone scan, which did show increased activity, especially around the tibia.  She has failed bracing and other nonoperative measures.  She presents now for total knee arthroplasty revision.  PROCEDURE IN DETAIL:  After successful administration of spinal anesthetic, a tourniquet was placed high on her right thigh and her right lower extremity was prepped and draped in the usual sterile fashion.  Extremity was wrapped in Esmarch and tourniquet inflated to 300 mmHg.  A midline incision made with a 10 blade through the subcutaneous tissue to the extensor mechanism.  A fresh blade was used to make a medial parapatellar arthrotomy.  We did not encounter any fluid in the joint.  Soft tissue on the proximal medial tibia was subperiosteally  elevated to the joint line with the knife into the semimembranosus bursa with a Cobb elevator.  Soft  tissue laterally was also elevated with attention being paid to avoid the patellar tendon on the tibial tubercle.  Patella was everted and knee flexed to 90 degrees. I then was able to remove the tibial polyethylene from the tibial tray. It was a Social research officer, government.  The tibia then was subluxed forward and the retractors were placed  circumferentially.  An oscillating saw was used to disrupt the interface between the tibial component bone. Once I saw at this interface just a short interval, the tibial component became extremely loose.  This was consistent with in already being loose.  The tibial component was removed.  Cement was then removed from the tibial canal.  I then  thoroughly irrigated the canal and reamed up to 13 mm x 13 mm cemented stem.  The extramedullary tibial cutting guide was then placed referencing proximally at the medial aspect of the tibial tubercle and distally along the second metatarsal axis and tibial crest.  Block was pinned to remove about 2 mm off the right cut bone surface.  Tibial resection is made with an oscillating saw.  Size 2.5 was the most appropriate tibial component.  We prepared with a modular drill and a modular drill  with a stem extension on it.  We then used a keel punch.  I also repaired with 29 mm sleeve.  Tibial component is sized to 2.5 MBT revision tray with a 29 sleeve and a 13 x 30 stem extension.  We then addressed the femur.  The interface between the femoral component bone and bone was disrupted with osteotomes and the femoral component removed with minimal bone loss.  We noted that the femoral component was not loose but wanted to go into a more constrained component as this was a revision.  The femoral canal was then accessed and thoroughly irrigated.  It was reamed up to 16 mm x 60 mm.  A 16 mm reamer was left in place that served as intramedullary cutting guide. The distal femoral cutting block was placed over that and 2 mm  are removed.  We did not use any distal augments.  The size 2.5 was most appropriate femoral component.  The 2.5 cutting block was placed in the +2 position.  Rotation is marked at the epicondylar axis and also rotation is referenced by creating a rectangular flexion gap at 90 degrees using the spacer blocks.  These 2 methods of measurements showed the same rotation.  The anterior posterior and chamfer cuts were then made.  We did not need to use any augments.  The intercondylar block was placed and that cut was subsequently made.  We then placed a trial on the tibial side, size 2.5 MBT revision tray with a 29 sleeve and a 13 x 30 stem extension, and on the femoral side, a size 2.5 TC3 femur with a 16 x 75 stem extension in a +2 position in 5 degrees of valgus.  A 17.5 spacer was the appropriate spacer to equalize the flexion-extension gap with the 17.5.  The knee was able to fully extend with excellent varus- valgus and anterior-posterior balance throughout full range of motion. Patella was everted and patelloplasty was performed removing soft tissue, which was overlying the patella.  The components are in good position.  It was well fixed.  It was left alone.  It tracks normally. We then let the tourniquet down for  8 minutes to stop any bleeding while the components were assembled on the back table.  After 8 minutes, we rewrapped leg in Esmarch and reinflated the tourniquet to 300 mmHg. The first tourniquet time was 33 minutes.  We removed the trial components and thoroughly irrigated the cut bone surfaces with pulsatile lavage.  A size 4 cement restrictor in the tibia size 4 was most appropriate and restrictor was placed to the appropriate depth in the tibial canal.  Cement was mixed and once ready for implantation, it was injected into tibial canal as well as the cut bone surface of the tibia. The size 2.5 MBT revision tray was then placed with a 29 sleeve.  It was impacted and all  extruded cement removed.  On the femoral side, we cemented distally but not the stem.  It was a size 2.5 TC3 femur with a 16 x 75 stem  extension.  The 17.5 insert trial was placed.  Knee held in full extension.  All extruded cement removed.  Once cement is fully hardened, then the permanent 17.5 mm TC3 rotating platform was inserted and placed in the tibial tray.  There was excellent stability throughout full range of motion.  Wound was copiously irrigated with saline solution and the arthrotomy was closed over Hemovac drain with a running #1 V-Loc  suture.  Flexion against gravity was 135 degrees.  Tourniquet released for a second time of 19 minutes.  The another drain was placed in the subcu tissue, which was then closed with interrupted 2-0 Vicryl. The subcuticular was closed with running 4-0 Monocryl.  Incisions were clean and dry.  Drains reattached and the patient was subsequently awakened and transported to recovery in stable condition.  Note that the surgical assistant was a medical necessity for this procedure to do it in a safe and expeditious manner.  Surgical assistant was necessary for retraction of ligaments and vital neurovascular structures, safe removal of the old prosthesis, and safe and accurate placement of the new prosthesis.     Gaynelle Arabian, M.D.     FA/MEDQ  D:  09/24/2014  T:  09/25/2014  Job:  [id number]

## 2014-09-25 NOTE — Discharge Instructions (Signed)
Information on my medicine - XARELTO (Rivaroxaban)  This medication education was reviewed with me or my healthcare representative as part of my discharge preparation.  The pharmacist that spoke with me during my hospital stay was:  Angela Adam [facility]  Why was Xarelto prescribed for you? Xarelto was prescribed for you to reduce the risk of blood clots forming after orthopedic surgery. The medical term for these abnormal blood clots is venous thromboembolism (VTE).  What do you need to know about xarelto ? Take your Xarelto ONCE DAILY at the same time every day. You may take it either with or without food.  If you have difficulty swallowing the tablet whole, you may crush it and mix in applesauce just prior to taking your dose.  Take Xarelto exactly as prescribed by your doctor and DO NOT stop taking Xarelto without talking to the doctor who prescribed the medication.  Stopping without other VTE prevention medication to take the place of Xarelto may increase your risk of developing a clot.  After discharge, you should have regular check-up appointments with your healthcare provider that is prescribing your Xarelto.    What do you do if you miss a dose? If you miss a dose, take it as soon as you remember on the same day then continue your regularly scheduled once daily regimen the next day. Do not take two doses of Xarelto on the same day.   Important Safety Information A possible side effect of Xarelto is bleeding. You should call your healthcare provider right away if you experience any of the following: ? Bleeding from an injury or your nose that does not stop. ? Unusual colored urine (red or dark brown) or unusual colored stools (red or black). ? Unusual bruising for unknown reasons. ? A serious fall or if you hit your head (even if there is no bleeding).  Some medicines may interact with Xarelto and might increase your risk of bleeding while on Xarelto. To help avoid  this, consult your healthcare provider or pharmacist prior to using any new prescription or non-prescription medications, including herbals, vitamins, non-steroidal anti-inflammatory drugs (NSAIDs) and supplements.  This website has more information on Xarelto: [email or web address]

## 2014-09-25 NOTE — Care Management Note (Signed)
    Page 1 of 1   09/25/2014     2:30:40 PM CARE MANAGEMENT NOTE 09/25/2014  Patient:  Mercedes Barr, Mercedes Barr   Account Number:  [id number]  Date Initiated:  09/25/2014  Documentation initiated by:  [facility]  Subjective/Objective Assessment:   adm: RIGHT TOTAL KNEE ARTHROPLASTY REVISION (Right)     Action/Plan:   discharge planning   Anticipated DC Date:  09/26/2014   Anticipated DC Plan:  [place]  CM consult      [facility] Choice  HOME HEALTH   Choice offered to / List presented to:  C-1 Patient        [place] arranged  HH-2 PT      [place]   Status of service:  Completed, signed off Medicare Important Message given?   (If response is "NO", the following Medicare IM given date fields will be blank) Date Medicare IM given:   Medicare IM given by:   Date Additional Medicare IM given:   Additional Medicare IM given by:    Discharge Disposition:  [place]  Per UR Regulation:    If discussed at Long Length of Stay Meetings, dates discussed:    Comments:  09/25/14 14:00 Cm met with pt in room to offer choice of home health agency.  Pt chooses [facility] for HHPT.  Pt requests "Mercedes Barr" as her PT. Address and contact information verifid by pt.  Referral emailed to [place], Tim with request for "Mercedes Barr."  No DME is needed as pt has both 3n1 and rolling walker from previous surgery.  No other Cm needs were communicated.  Mariane Masters, BSN, CM [phone].

## 2014-09-25 NOTE — Evaluation (Signed)
Physical Therapy Evaluation Patient Details Name: Mercedes Barr MRN: [id number] DOB: July 19, 1946 Today's Date: 09/25/2014   History of Present Illness  R TKR revision  Clinical Impression  Pt s/p R TKR revision presents with decreased R LE strength/ROM, post op pain and decreased BP limiting functional mobility.  Pt should progress to d.c home with family assist and HHPT follow up.    Follow Up Recommendations Home health PT    Equipment Recommendations  None recommended by PT    Recommendations for Other Services OT consult     Precautions / Restrictions Precautions Precautions: Knee;Fall Precaution Comments: decreased BP on eval Required Braces or Orthoses: Knee Immobilizer - Right Knee Immobilizer - Right: Discontinue once straight leg raise with < 10 degree lag Restrictions Weight Bearing Restrictions: No Other Position/Activity Restrictions: WBAT      Mobility  Bed Mobility               General bed mobility comments: OOB deferred this am - pt with c/o mild dizziness in bed with BP 90/40  Transfers                    Ambulation/Gait                Stairs            Wheelchair Mobility    Modified Rankin (Stroke Patients Only)       Balance                                             Pertinent Vitals/Pain Pain Assessment: 0-10 Pain Score: 5  Pain Location: R knee Pain Descriptors / Indicators: Aching;Sore Pain Intervention(s): Limited activity within patient's tolerance;Monitored during session;Premedicated before session;Ice applied    Home Living Family/patient expects to be discharged to:: Private residence Living Arrangements: Children Available Help at Discharge: Family Type of Home: House Home Access: [place]: One [place]: Environmental consultant - 2 wheels      Prior Function Level of Independence: Independent;Independent with assistive device(s)                Hand Dominance   Dominant Hand: Right    Extremity/Trunk Assessment   Upper Extremity Assessment: Overall WFL for tasks assessed           Lower Extremity Assessment: RLE deficits/detail RLE Deficits / Details: 2/5 quads with AAROM at knee -10-  35    Cervical / Trunk Assessment: Normal  Communication   Communication: No difficulties  Cognition Arousal/Alertness: Awake/alert Behavior During Therapy: WFL for tasks assessed/performed Overall Cognitive Status: Within Functional Limits for tasks assessed                      General Comments      Exercises Total Joint Exercises Ankle Circles/Pumps: AROM;Both;15 reps;Supine Quad Sets: AROM;Both;10 reps;Supine Heel Slides: AAROM;Right;10 reps;Supine Straight Leg Raises: AAROM;Right;10 reps;Supine      Assessment/Plan    PT Assessment Patient needs continued PT services  PT Diagnosis Difficulty walking   PT Problem List Decreased strength;Decreased range of motion;Decreased activity tolerance;Decreased mobility;Decreased knowledge of use of DME;Pain  PT Treatment Interventions DME instruction;Gait training;Functional mobility training;Therapeutic activities;Therapeutic exercise;Patient/family education   PT Goals (Current goals can be found in the Care Plan section) Acute Rehab PT Goals Patient Stated Goal: Resume  previous lifestyle with decreased pain PT Goal Formulation: With patient Time For Goal Achievement: 10/02/14 Potential to Achieve Goals: Good    Frequency 7X/week   Barriers to discharge        Co-evaluation               End of Session Equipment Utilized During Treatment: Right knee immobilizer Activity Tolerance: Other (comment);Patient tolerated treatment well (ltd to bed 2* decreased BP) Patient left: in bed;with call bell/phone within reach;with family/visitor present Nurse Communication: Mobility status         Time: 1135-1204 PT Time Calculation (min) (ACUTE ONLY): 29  min   Charges:   PT Evaluation $Initial PT Evaluation Tier I: 1 Procedure PT Treatments $Therapeutic Exercise: 8-22 mins   PT G Codes:        Maymie Brunke Oct 15, 2014, 1:08 PM

## 2014-09-25 NOTE — Progress Notes (Signed)
OT Cancellation Note  Patient Details Name: Mercedes Barr MRN: [id number] DOB: 07/16/1946   Cancelled Treatment:    Reason Eval/Treat Not Completed: Other (comment). Pt still with low BP.  Will check back tomorrow.  Tony Granquist 09/25/2014, 3:57 PM  Lesle Chris, OTR/L [phone] 09/25/2014

## 2014-09-25 NOTE — Plan of Care (Signed)
Problem: Consults Goal: Diagnosis- Total Joint Replacement Outcome: Completed/Met Date Met:  09/25/14 Revision Total Knee RIGHT

## 2014-09-25 NOTE — Progress Notes (Signed)
Physical Therapy Treatment Patient Details Name: Mercedes Barr MRN: [id number] DOB: [birth year] Today's Date: 09/25/2014    History of Present Illness R TKR revision    PT Comments    Progressed to OOB with pt c/o mild dizziness but BP upon sitting in chair 101/57.  Follow Up Recommendations  Home health PT     Equipment Recommendations  None recommended by PT    Recommendations for Other Services OT consult     Precautions / Restrictions Precautions Precautions: Knee;Fall Precaution Comments: low BP Required Braces or Orthoses: Knee Immobilizer - Right Knee Immobilizer - Right: Discontinue once straight leg raise with < 10 degree lag Restrictions Weight Bearing Restrictions: No Other Position/Activity Restrictions: WBAT    Mobility  Bed Mobility Overal bed mobility: Needs Assistance Bed Mobility: Supine to Sit     Supine to sit: Min assist;Mod assist     General bed mobility comments: cues for sequence and use of L LE to self assist  Transfers Overall transfer level: Needs assistance Equipment used: Rolling walker (2 wheeled) Transfers: Sit to/from Stand Sit to Stand: +2 physical assistance;Mod assist         General transfer comment: cues for LE management and use of UEs to self assist  Ambulation/Gait Ambulation/Gait assistance: Min assist;Mod assist;+2 physical assistance;+2 safety/equipment Ambulation Distance (Feet): 2 Feet Assistive device: Rolling walker (2 wheeled) Gait Pattern/deviations: Step-to pattern;Decreased step length - right;Decreased step length - left;Shuffle;Antalgic;Trunk flexed Gait velocity: decre   General Gait Details: cues for sequence, posture and position from RW - pt tolerating min wt on R LE.  Distance ltd by ongoing low BP   Stairs            Wheelchair Mobility    Modified Rankin (Stroke Patients Only)       Balance                                    Cognition Arousal/Alertness:  Awake/alert Behavior During Therapy: WFL for tasks assessed/performed Overall Cognitive Status: Within Functional Limits for tasks assessed                      Exercises Total Joint Exercises Ankle Circles/Pumps: AROM;Both;15 reps;Supine Quad Sets: AROM;Both;10 reps;Supine Heel Slides: AAROM;Right;10 reps;Supine Straight Leg Raises: AAROM;Right;10 reps;Supine    General Comments        Pertinent Vitals/Pain Pain Assessment: 0-10 Pain Score: 5  Pain Location: R knee Pain Descriptors / Indicators: Aching;Sore Pain Intervention(s): Limited activity within patient's tolerance;Monitored during session;Premedicated before session;Ice applied    Home Living                      Prior Function            PT Goals (current goals can now be found in the care plan section) Acute Rehab PT Goals Patient Stated Goal: Resume previous lifestyle with decreased pain PT Goal Formulation: With patient Time For Goal Achievement: 10/02/14 Potential to Achieve Goals: Good Progress towards PT goals: Progressing toward goals    Frequency  7X/week    PT Plan Current plan remains appropriate    Co-evaluation             End of Session Equipment Utilized During Treatment: Right knee immobilizer Activity Tolerance: Patient tolerated treatment well Patient left: in chair;with call bell/phone within reach;with family/visitor present     Time: 8182-9937 PT  Time Calculation (min) (ACUTE ONLY): 30 min  Charges:  $Therapeutic Exercise: 8-22 mins $Therapeutic Activity: 8-22 mins                    G Codes:      Harjit Leider October 07, 2014, 4:38 PM

## 2014-09-26 ENCOUNTER — Telehealth: Payer: Self-pay | Admitting: Cardiovascular Disease

## 2014-09-26 LAB — CBC
HCT: 28.2 % — ABNORMAL LOW (ref 36.0–46.0)
HEMOGLOBIN: 9.2 g/dL — AB (ref 12.0–15.0)
MCH: 28 pg (ref 26.0–34.0)
MCHC: 32.6 g/dL (ref 30.0–36.0)
MCV: 86 fL (ref 78.0–100.0)
PLATELETS: 178 10*3/uL (ref 150–400)
RBC: 3.28 MIL/uL — ABNORMAL LOW (ref 3.87–5.11)
RDW: 12.7 % (ref 11.5–15.5)
WBC: 10.3 10*3/uL (ref 4.0–10.5)

## 2014-09-26 LAB — GLUCOSE, CAPILLARY
GLUCOSE-CAPILLARY: 223 mg/dL — AB (ref 70–99)
GLUCOSE-CAPILLARY: 273 mg/dL — AB (ref 70–99)
Glucose-Capillary: 155 mg/dL — ABNORMAL HIGH (ref 70–99)
Glucose-Capillary: 156 mg/dL — ABNORMAL HIGH (ref 70–99)

## 2014-09-26 LAB — BASIC METABOLIC PANEL
Anion gap: 7 (ref 5–15)
BUN: 11 mg/dL (ref 6–23)
CHLORIDE: 105 mmol/L (ref 96–112)
CO2: 28 mmol/L (ref 19–32)
CREATININE: 0.71 mg/dL (ref 0.50–1.10)
Calcium: 8.5 mg/dL (ref 8.4–10.5)
GFR calc non Af Amer: 87 mL/min — ABNORMAL LOW (ref >90)
Glucose, Bld: 244 mg/dL — ABNORMAL HIGH (ref 70–99)
POTASSIUM: 4 mmol/L (ref 3.5–5.1)
Sodium: 140 mmol/L (ref 135–145)

## 2014-09-26 MED ORDER — HYDROCODONE-ACETAMINOPHEN 5-325 MG PO TABS
1.0000 | ORAL_TABLET | ORAL | Status: DC | PRN
  Administered 2014-09-26 – 2014-09-27 (×5): 2 via ORAL
  Filled 2014-09-26 (×5): qty 2

## 2014-09-26 MED ORDER — BISACODYL 10 MG RE SUPP
10.0000 mg | Freq: Once | RECTAL | Status: AC
  Administered 2014-09-26: 10 mg via RECTAL
  Filled 2014-09-26: qty 1

## 2014-09-26 MED ORDER — SODIUM CHLORIDE 0.9 % IV BOLUS (SEPSIS)
500.0000 mL | Freq: Once | INTRAVENOUS | Status: AC
  Administered 2014-09-26: 500 mL via INTRAVENOUS

## 2014-09-26 NOTE — Progress Notes (Signed)
Physical Therapy Treatment Patient Details Name: Mercedes Barr MRN: [id number] DOB: 1945-12-07 Today's Date: 09/26/2014    History of Present Illness R TKR revision    PT Comments    POD # 2 am session.  Applied KI and instructed pt on use for amb.  Assisted OOB to amb a limited distance in hallway due to MAX c/o feeling "woozy" and MAX c/o fatigue.  BP standing 127/49.  Pt returned to room in recliner and applied ICE.  Follow Up Recommendations  Home health PT     Equipment Recommendations       Recommendations for Other Services       Precautions / Restrictions Precautions Precautions: Knee;Fall Precaution Comments: instructed pt on KI use for amb Required Braces or Orthoses: Knee Immobilizer - Right Knee Immobilizer - Right: Discontinue once straight leg raise with < 10 degree lag Restrictions Weight Bearing Restrictions: No Other Position/Activity Restrictions: WBAT    Mobility  Bed Mobility Overal bed mobility: Needs Assistance Bed Mobility: Supine to Sit     Supine to sit: Min assist     General bed mobility comments: cues for sequence and use of L LE to self assist  Transfers Overall transfer level: Needs assistance Equipment used: Rolling walker (2 wheeled) Transfers: Sit to/from Stand Sit to Stand: Min assist         General transfer comment: cues for LE management and use of UEs to self assist plus increased time  Ambulation/Gait Ambulation/Gait assistance: Min assist Ambulation Distance (Feet): 14 Feet Assistive device: Rolling walker (2 wheeled) Gait Pattern/deviations: Step-to pattern;Decreased stance time - right Gait velocity: decreased   General Gait Details: cues for sequence, posture and position from RW - max c/o weakness/fatigue and feeling "woozy"   Stairs            Wheelchair Mobility    Modified Rankin (Stroke Patients Only)       Balance                                    Cognition  Arousal/Alertness: Awake/alert Behavior During Therapy: WFL for tasks assessed/performed Overall Cognitive Status: Within Functional Limits for tasks assessed                      Exercises      General Comments        Pertinent Vitals/Pain Pain Assessment: 0-10 Pain Score: 6  Pain Location: R knee Pain Descriptors / Indicators: Tender;Sore Pain Intervention(s): Monitored during session;Premedicated before session;Repositioned;Ice applied    Home Living                      Prior Function            PT Goals (current goals can now be found in the care plan section) Progress towards PT goals: Progressing toward goals    Frequency  7X/week    PT Plan Current plan remains appropriate    Co-evaluation             End of Session Equipment Utilized During Treatment: Right knee immobilizer Activity Tolerance: Patient limited by fatigue Patient left: in chair;with call bell/phone within reach;with family/visitor present     Time: 1100-1130 PT Time Calculation (min) (ACUTE ONLY): 30 min  Charges:  $Gait Training: 8-22 mins $Therapeutic Activity: 8-22 mins  G Codes:      Rica Koyanagi  PTA [facility]  Acute  Rehab Pager      [phone]

## 2014-09-26 NOTE — Progress Notes (Signed)
OT Cancellation Note  Patient Details Name: Mercedes Barr MRN: [id number] DOB: Nov 30, 1945   Cancelled Treatment:    Reason Eval/Treat Not Completed: Other (comment).  Pt reports she is still nauseous:  Did walk with PT earlier.  Will likely check back tomorrow.    Delwyn Scoggin 09/26/2014, 1:02 PM  Lesle Chris, OTR/L [phone] 09/26/2014

## 2014-09-26 NOTE — Progress Notes (Signed)
Physical Therapy Treatment Patient Details Name: Mercedes Barr MRN: [id number] DOB: 09/16/1945 Today's Date: 09/26/2014    History of Present Illness R TKR revision    PT Comments    POD # 2 pm session.  amb in hallway an increased distance.  Assisted to BR but too soon to void after foley D/C.  Assisted back to bed and performed all supine TKR TE's followed by ICE.    Follow Up Recommendations  Home health PT     Equipment Recommendations       Recommendations for Other Services       Precautions / Restrictions Precautions Precautions: Knee;Fall Precaution Comments: instructed pt on KI use for amb Required Braces or Orthoses: Knee Immobilizer - Right Knee Immobilizer - Right: Discontinue once straight leg raise with < 10 degree lag Restrictions Weight Bearing Restrictions: No Other Position/Activity Restrictions: WBAT    Mobility  Bed Mobility Overal bed mobility: Needs Assistance Bed Mobility: Sit to Supine     Supine to sit: Min assist Sit to supine: Min assist   General bed mobility comments: support R LE  Transfers Overall transfer level: Needs assistance Equipment used: Rolling walker (2 wheeled) Transfers: Sit to/from Stand Sit to Stand: Min assist         General transfer comment: cues for LE management and use of UEs to self assist plus increased time.  assisted on/off toilet with MAX assist due to lower level surface.   Ambulation/Gait Ambulation/Gait assistance: Min assist Ambulation Distance (Feet): 48 Feet Assistive device: Rolling walker (2 wheeled) Gait Pattern/deviations: Step-to pattern;Decreased stance time - right Gait velocity: decreased   General Gait Details: cues for sequence, posture and position from RW - max c/o weakness/fatigue and feeling "woozy"   Stairs            Wheelchair Mobility    Modified Rankin (Stroke Patients Only)       Balance                                    Cognition  Arousal/Alertness: Awake/alert Behavior During Therapy: WFL for tasks assessed/performed Overall Cognitive Status: Within Functional Limits for tasks assessed                      Exercises   Total Knee Replacement TE's 10 reps B LE ankle pumps 5 reps towel squeezes 5 reps knee presses 5 reps heel slides AAROM 5reps SAQ's 5 reps SLR's AAROM 5 reps ABD AAROM Followed by ICE     General Comments        Pertinent Vitals/Pain Pain Assessment: 0-10 Pain Score: 6  Pain Location: R knee Pain Descriptors / Indicators: Tender;Sore Pain Intervention(s): Monitored during session;Premedicated before session;Repositioned;Ice applied    Home Living                      Prior Function            PT Goals (current goals can now be found in the care plan section) Progress towards PT goals: Progressing toward goals    Frequency  7X/week    PT Plan Current plan remains appropriate    Co-evaluation             End of Session Equipment Utilized During Treatment: Right knee immobilizer Activity Tolerance: Patient limited by fatigue Patient left: in chair;with call bell/phone within reach;with family/visitor present  Time: 2202-5427 PT Time Calculation (min) (ACUTE ONLY): 42 min  Charges:  $Gait Training: 8-22 mins $Therapeutic Exercise: 8-22 mins $Therapeutic Activity: 8-22 mins                    G Codes:      Rica Koyanagi  PTA [facility]  Acute  Rehab Pager      [phone]

## 2014-09-26 NOTE — Progress Notes (Signed)
   Subjective: 2 Days Post-Op Procedure(s) (LRB): RIGHT TOTAL KNEE ARTHROPLASTY REVISION (Right) Patient reports pain as mild and moderate.   Patient seen in rounds with Dr. Wynelle Link. Ran low pressures yesterday.  BP a little better this morning. She had complaints of dizziness yesterday. Also continued nausea yesterday  Will change meds but if nausea continues, will check EKG later today. Patient is having problems with pain in the knee, requiring pain medications and continued nausea. Plan is to go Home after hospital stay.  Objective: Vital signs in last 24 hours: Temp:  [97.9 F (36.6 C)-100 F (37.8 C)] 100 F (37.8 C) (02/19 0530) Pulse Rate:  [70-78] 78 (02/19 0530) Resp:  [16-20] 16 (02/19 0753) BP: (90-109)/(40-77) 109/50 mmHg (02/19 0530) SpO2:  [94 %-100 %] 94 % (02/19 0530)  Intake/Output from previous day:  Intake/Output Summary (Last 24 hours) at 09/26/14 0829 Last data filed at 09/26/14 0530  Gross per 24 hour  Intake 2574.75 ml  Output   1950 ml  Net 624.75 ml    Intake/Output this shift: UOP 550 since around [place]  Labs:  Recent Labs  09/25/14 0517 09/26/14 0444  HGB 10.7* 9.2*    Recent Labs  09/25/14 0517 09/26/14 0444  WBC 9.0 10.3  RBC 3.84* 3.28*  HCT 32.5* 28.2*  PLT 206 178    Recent Labs  09/25/14 0517 09/26/14 0444  NA 137 140  K 4.1 4.0  CL 98 105  CO2 29 28  BUN 11 11  CREATININE 0.69 0.71  GLUCOSE 260* 244*  CALCIUM 8.7 8.5   No results for input(s): LABPT, INR in the last 72 hours.  EXAM General - Patient is Alert, Appropriate and Oriented Extremity - Neurovascular intact Sensation intact distally Dorsiflexion/Plantar flexion intact Dressing/Incision - clean, dry, no drainage, healing Motor Function - intact, moving foot and toes well on exam.   Past Medical History  Diagnosis Date  . Diabetes mellitus   . Hypertension   . Hypothyroidism   . COPD (chronic obstructive pulmonary disease)   . Heart murmur   .  GERD (gastroesophageal reflux disease)   . 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 Clinton.  OFFICE NOTE DR. A. LITTLE STATES  "EXTREMELY PACEMAKER SENSITIVE AND WHEN YOU TRY TO CHECK FOR UNDERLYING RHYTHMS SHE WILL HAVE SNYCOPE AND WE HAVE NOT DONE THIS NOW IN ABOUT 2 YEARS".  . Normal coronary arteries 2011  . Complete heart block   . Coronary artery disease   . Shortness of breath dyspnea     walking distance or climbing stairs  . Asthma     Assessment/Plan: 2 Days Post-Op Procedure(s) (LRB): RIGHT TOTAL KNEE ARTHROPLASTY REVISION (Right) Principal Problem:   Failed total right knee replacement Active Problems:   OA (osteoarthritis) of knee  Estimated body mass index is 25.05 kg/(m^2) as calculated from the following:   Height as of this encounter: 5\' 2"  (1.575 m).   Weight as of this encounter: 62.143 kg (137 lb). Up with therapy Plan for discharge tomorrow Discharge home with home health if she improves. Monitor for continued nausea.  Check EKG today if continues. Changed pain med to Norco. Fluid bolus this morning.  DVT Prophylaxis - Xarelto Weight-Bearing as tolerated to right leg  [name], PA-C Orthopaedic Surgery 09/26/2014, 8:29 AM

## 2014-09-26 NOTE — Progress Notes (Signed)
OT Cancellation Note  Patient Details Name: Mercedes Barr MRN: [id number] DOB: 1946/02/03   Cancelled Treatment:    Reason Eval/Treat Not Completed: Other (comment).  Pt is nauseous this am. Will check back.  Yonatan Guitron 09/26/2014, 9:11 AM  Lesle Chris, OTR/L [phone] 09/26/2014

## 2014-09-27 LAB — CBC
HEMATOCRIT: 27.2 % — AB (ref 36.0–46.0)
HEMOGLOBIN: 8.6 g/dL — AB (ref 12.0–15.0)
MCH: 27.1 pg (ref 26.0–34.0)
MCHC: 31.6 g/dL (ref 30.0–36.0)
MCV: 85.8 fL (ref 78.0–100.0)
Platelets: 187 10*3/uL (ref 150–400)
RBC: 3.17 MIL/uL — ABNORMAL LOW (ref 3.87–5.11)
RDW: 12.6 % (ref 11.5–15.5)
WBC: 10.9 10*3/uL — ABNORMAL HIGH (ref 4.0–10.5)

## 2014-09-27 LAB — GLUCOSE, CAPILLARY: Glucose-Capillary: 167 mg/dL — ABNORMAL HIGH (ref 70–99)

## 2014-09-27 MED ORDER — RIVAROXABAN 10 MG PO TABS
10.0000 mg | ORAL_TABLET | Freq: Every day | ORAL | Status: DC
Start: 2014-09-27 — End: 2015-04-27

## 2014-09-27 MED ORDER — DOCUSATE SODIUM 100 MG PO CAPS: 100.0000 mg | ORAL_CAPSULE | Freq: Two times a day (BID) | ORAL | Status: DC

## 2014-09-27 MED ORDER — HYDROCODONE-ACETAMINOPHEN 5-325 MG PO TABS: 1.0000 | ORAL_TABLET | ORAL | Status: DC | PRN

## 2014-09-27 MED ORDER — METHOCARBAMOL 500 MG PO TABS: 500.0000 mg | ORAL_TABLET | Freq: Four times a day (QID) | ORAL | Status: DC | PRN

## 2014-09-27 NOTE — Progress Notes (Signed)
     Subjective: 3 Days Post-Op Procedure(s) (LRB): RIGHT TOTAL KNEE ARTHROPLASTY REVISION (Right)   Patient reports pain as mild, pain controlled.  No events throughout the night. States that she has been up multiple times without getting dizzy and feels much better as that is concerned. Feels ready to be discharged home.  Objective:   VITALS:   Filed Vitals:   09/27/14  BP: 122/54  Pulse: 83  Temp: 98.3 F (36.8 C)   Resp: 16    Dorsiflexion/Plantar flexion intact Incision: dressing C/D/I No cellulitis present Compartment soft  LABS  Recent Labs  09/25/14 0517 09/26/14 0444 09/27/14 0520  HGB 10.7* 9.2* 8.6*  HCT 32.5* 28.2* 27.2*  WBC 9.0 10.3 10.9*  PLT 206 178 187     Recent Labs  09/25/14 0517 09/26/14 0444  NA 137 140  K 4.1 4.0  BUN 11 11  CREATININE 0.69 0.71  GLUCOSE 260* 244*     Assessment/Plan: 3 Days Post-Op Procedure(s) (LRB): RIGHT TOTAL KNEE ARTHROPLASTY REVISION (Right) Dressing changed with 4x4 gauze and tape Up with therapy Discharge home with home health Follow up in 2 weeks at [facility]. Follow up with Dr. Wynelle Link in 2 weeks.  Contact information:  [facility] [address] [place]   PAC  09/27/2014, 8:46 AM

## 2014-09-27 NOTE — Progress Notes (Signed)
Discharge instructions and prescriptions reviewed with patient and family utilizing teach back method.  Patient being discharged to home.

## 2014-09-27 NOTE — Progress Notes (Signed)
Physical Therapy Treatment Patient Details Name: Mercedes Barr MRN: [id number] DOB: 02/07/1946 Today's Date: 09/27/2014    History of Present Illness R TKR revision    PT Comments    Pt motivated and progressing well with mobility.  Reviewed don/doff KI with pt.  Follow Up Recommendations  Home health PT     Equipment Recommendations  None recommended by PT    Recommendations for Other Services OT consult     Precautions / Restrictions Precautions Precautions: Knee;Fall Required Braces or Orthoses: Knee Immobilizer - Right Knee Immobilizer - Right: Discontinue once straight leg raise with < 10 degree lag Restrictions Weight Bearing Restrictions: No Other Position/Activity Restrictions: WBAT    Mobility  Bed Mobility               General bed mobility comments: OOB with OT  Transfers Overall transfer level: Needs assistance Equipment used: Rolling walker (2 wheeled) Transfers: Sit to/from Stand Sit to Stand: Min assist;Min guard         General transfer comment: cues for LE management and use of UEs to self assist.  To from comode, from chair and to EOB  Ambulation/Gait Ambulation/Gait assistance: Min guard;Supervision Ambulation Distance (Feet): 114 Feet (and 15') Assistive device: Rolling walker (2 wheeled) Gait Pattern/deviations: Step-to pattern;Decreased step length - right;Decreased step length - left;Shuffle;Trunk flexed Gait velocity: decreased   General Gait Details: cues for sequence, posture and position from RW - max c/o weakness/fatigue and feeling "woozy"   Stairs            Wheelchair Mobility    Modified Rankin (Stroke Patients Only)       Balance                                    Cognition Arousal/Alertness: Awake/alert Behavior During Therapy: WFL for tasks assessed/performed Overall Cognitive Status: Within Functional Limits for tasks assessed                      Exercises Total Joint  Exercises Ankle Circles/Pumps: AROM;Both;15 reps;Supine Quad Sets: AROM;Both;Supine;15 reps Heel Slides: AAROM;Right;Supine;20 reps Straight Leg Raises: AAROM;Right;Supine;20 reps Goniometric ROM: AAROM at R knee -10 - 65    General Comments        Pertinent Vitals/Pain Pain Assessment: 0-10 Pain Score: 4  Pain Location: R knee Pain Descriptors / Indicators: Aching;Sore Pain Intervention(s): Limited activity within patient's tolerance;Monitored during session;Premedicated before session;Ice applied    Home Living Family/patient expects to be discharged to:: Private residence Living Arrangements: Children Available Help at Discharge: Family Type of Home: House Home Access: [place]: One [place]: Environmental consultant - 2 wheels;Bedside commode;Grab bars - tub/shower      Prior Function Level of Independence: Independent;Independent with assistive device(s)          PT Goals (current goals can now be found in the care plan section) Acute Rehab PT Goals Patient Stated Goal: Resume previous lifestyle with decreased pain PT Goal Formulation: With patient Time For Goal Achievement: 10/02/14 Potential to Achieve Goals: Good Progress towards PT goals: Progressing toward goals    Frequency  7X/week    PT Plan Current plan remains appropriate    Co-evaluation             End of Session Equipment Utilized During Treatment: Right knee immobilizer Activity Tolerance: Patient tolerated treatment well Patient left: in bed;with call  bell/phone within reach     Time: 1035-1113 PT Time Calculation (min) (ACUTE ONLY): 38 min  Charges:  $Gait Training: 8-22 mins $Therapeutic Exercise: 8-22 mins $Therapeutic Activity: 8-22 mins                    G Codes:      Mercedes Barr Oct 23, 2014, 12:32 PM

## 2014-09-27 NOTE — Progress Notes (Signed)
Medicare Important Message given? YES  Date Medicare IM given:  09/27/2014 Medicare IM given by: Jonnie Finner

## 2014-09-27 NOTE — Progress Notes (Signed)
Occupational Therapy Evaluation Patient Details Name: Mercedes Barr MRN: [id number] DOB: [birth year] Today's Date: 09/27/2014    History of Present Illness R TKR revision   Clinical Impression   Patient reports that she recalls ADL techniques from previous surgery. Reviewed all with patient. She feels ready to go home, and her daughter will assist with LB self-care and shower transfers at home.    Follow Up Recommendations  No OT follow up;Supervision/Assistance - 24 hour    Equipment Recommendations  None recommended by OT (has all needed DME)    Recommendations for Other Services       Precautions / Restrictions Precautions Precautions: Knee;Fall Required Braces or Orthoses: Knee Immobilizer - Right Knee Immobilizer - Right: Discontinue once straight leg raise with < 10 degree lag Restrictions Weight Bearing Restrictions: No Other Position/Activity Restrictions: WBAT      Mobility Bed Mobility                  Transfers                      Balance                                            ADL Overall ADL's : Needs assistance/impaired Eating/Feeding: Independent;Sitting   Grooming: Wash/dry hands;Supervision/safety;Sitting;Standing;Set up   Upper Body Bathing: Set up;Sitting   Lower Body Bathing: Moderate assistance;Sit to/from stand   Upper Body Dressing : Set up;Sitting   Lower Body Dressing: Moderate assistance;Sit to/from stand   Toilet Transfer: Supervision/safety   Toileting- Water quality scientist and Hygiene: Supervision/safety       Functional mobility during ADLs: Rolling walker;Supervision/safety General ADL Comments: Patient reports her daughter will assist her as needed with LB bathing and dressing and with shower transfer. Patient reports she remembers most ADL techniques from previous surgery.      Vision     Perception     Praxis      Pertinent Vitals/Pain Pain Assessment: 0-10 Pain Score:  4  Pain Location: R knee Pain Descriptors / Indicators: Aching Pain Intervention(s): Monitored during session     Hand Dominance Right   Extremity/Trunk Assessment Upper Extremity Assessment Upper Extremity Assessment: Overall WFL for tasks assessed   Lower Extremity Assessment Lower Extremity Assessment: Defer to PT evaluation       Communication Communication Communication: No difficulties   Cognition Arousal/Alertness: Awake/alert Behavior During Therapy: WFL for tasks assessed/performed Overall Cognitive Status: Within Functional Limits for tasks assessed                     General Comments       Exercises       Shoulder Instructions      Home Living Family/patient expects to be discharged to:: Private residence Living Arrangements: Children Available Help at Discharge: Family Type of Home: House Home Access: Ramped entrance     Home Layout: One level     Bathroom Shower/Tub: Occupational psychologist: Standard     Home Equipment: Environmental consultant - 2 wheels;Bedside commode;Grab bars - tub/shower          Prior Functioning/Environment Level of Independence: Independent;Independent with assistive device(s)             OT Diagnosis: Acute pain   OT Problem List: Decreased range of motion;Decreased strength;Pain   OT Treatment/Interventions:  OT Goals(Current goals can be found in the care plan section)    OT Frequency:     Barriers to D/C:            Co-evaluation              End of Session Equipment Utilized During Treatment: Rolling walker;Right knee immobilizer Nurse Communication: Mobility status  Activity Tolerance: Patient tolerated treatment well Patient left: in chair;with call bell/phone within reach   Time: 0349-1791 OT Time Calculation (min): 10 min Charges:  OT General Charges $OT Visit: 1 Procedure OT Evaluation $Initial OT Evaluation Tier I: 1 Procedure G-Codes:    Mercedes Barr A 10-10-2014,  9:52 AM

## 2014-09-29 DIAGNOSIS — E079 Disorder of thyroid, unspecified: Secondary | ICD-10-CM | POA: Diagnosis not present

## 2014-09-29 DIAGNOSIS — E119 Type 2 diabetes mellitus without complications: Secondary | ICD-10-CM | POA: Diagnosis not present

## 2014-09-29 DIAGNOSIS — T84092D Other mechanical complication of internal right knee prosthesis, subsequent encounter: Secondary | ICD-10-CM | POA: Diagnosis not present

## 2014-09-29 DIAGNOSIS — H409 Unspecified glaucoma: Secondary | ICD-10-CM | POA: Diagnosis not present

## 2014-09-29 DIAGNOSIS — F329 Major depressive disorder, single episode, unspecified: Secondary | ICD-10-CM | POA: Diagnosis not present

## 2014-09-29 DIAGNOSIS — J449 Chronic obstructive pulmonary disease, unspecified: Secondary | ICD-10-CM | POA: Diagnosis not present

## 2014-09-29 DIAGNOSIS — E785 Hyperlipidemia, unspecified: Secondary | ICD-10-CM | POA: Diagnosis not present

## 2014-09-29 DIAGNOSIS — K219 Gastro-esophageal reflux disease without esophagitis: Secondary | ICD-10-CM | POA: Diagnosis not present

## 2014-09-29 DIAGNOSIS — Z471 Aftercare following joint replacement surgery: Secondary | ICD-10-CM | POA: Diagnosis not present

## 2014-09-29 DIAGNOSIS — I10 Essential (primary) hypertension: Secondary | ICD-10-CM | POA: Diagnosis not present

## 2014-09-29 NOTE — Telephone Encounter (Signed)
Closed encounter °

## 2014-09-30 DIAGNOSIS — K219 Gastro-esophageal reflux disease without esophagitis: Secondary | ICD-10-CM | POA: Diagnosis not present

## 2014-09-30 DIAGNOSIS — E785 Hyperlipidemia, unspecified: Secondary | ICD-10-CM | POA: Diagnosis not present

## 2014-09-30 DIAGNOSIS — J449 Chronic obstructive pulmonary disease, unspecified: Secondary | ICD-10-CM | POA: Diagnosis not present

## 2014-09-30 DIAGNOSIS — Z471 Aftercare following joint replacement surgery: Secondary | ICD-10-CM | POA: Diagnosis not present

## 2014-09-30 DIAGNOSIS — I10 Essential (primary) hypertension: Secondary | ICD-10-CM | POA: Diagnosis not present

## 2014-09-30 DIAGNOSIS — E119 Type 2 diabetes mellitus without complications: Secondary | ICD-10-CM | POA: Diagnosis not present

## 2014-09-30 DIAGNOSIS — H409 Unspecified glaucoma: Secondary | ICD-10-CM | POA: Diagnosis not present

## 2014-09-30 DIAGNOSIS — F329 Major depressive disorder, single episode, unspecified: Secondary | ICD-10-CM | POA: Diagnosis not present

## 2014-09-30 DIAGNOSIS — E079 Disorder of thyroid, unspecified: Secondary | ICD-10-CM | POA: Diagnosis not present

## 2014-09-30 DIAGNOSIS — T84092D Other mechanical complication of internal right knee prosthesis, subsequent encounter: Secondary | ICD-10-CM | POA: Diagnosis not present

## 2014-10-02 DIAGNOSIS — I10 Essential (primary) hypertension: Secondary | ICD-10-CM | POA: Diagnosis not present

## 2014-10-02 DIAGNOSIS — H409 Unspecified glaucoma: Secondary | ICD-10-CM | POA: Diagnosis not present

## 2014-10-02 DIAGNOSIS — J449 Chronic obstructive pulmonary disease, unspecified: Secondary | ICD-10-CM | POA: Diagnosis not present

## 2014-10-02 DIAGNOSIS — E079 Disorder of thyroid, unspecified: Secondary | ICD-10-CM | POA: Diagnosis not present

## 2014-10-02 DIAGNOSIS — T84092D Other mechanical complication of internal right knee prosthesis, subsequent encounter: Secondary | ICD-10-CM | POA: Diagnosis not present

## 2014-10-02 DIAGNOSIS — E785 Hyperlipidemia, unspecified: Secondary | ICD-10-CM | POA: Diagnosis not present

## 2014-10-02 DIAGNOSIS — K219 Gastro-esophageal reflux disease without esophagitis: Secondary | ICD-10-CM | POA: Diagnosis not present

## 2014-10-02 DIAGNOSIS — E119 Type 2 diabetes mellitus without complications: Secondary | ICD-10-CM | POA: Diagnosis not present

## 2014-10-02 DIAGNOSIS — Z471 Aftercare following joint replacement surgery: Secondary | ICD-10-CM | POA: Diagnosis not present

## 2014-10-02 DIAGNOSIS — F329 Major depressive disorder, single episode, unspecified: Secondary | ICD-10-CM | POA: Diagnosis not present

## 2014-10-06 DIAGNOSIS — J449 Chronic obstructive pulmonary disease, unspecified: Secondary | ICD-10-CM | POA: Diagnosis not present

## 2014-10-06 DIAGNOSIS — K219 Gastro-esophageal reflux disease without esophagitis: Secondary | ICD-10-CM | POA: Diagnosis not present

## 2014-10-06 DIAGNOSIS — E079 Disorder of thyroid, unspecified: Secondary | ICD-10-CM | POA: Diagnosis not present

## 2014-10-06 DIAGNOSIS — Z471 Aftercare following joint replacement surgery: Secondary | ICD-10-CM | POA: Diagnosis not present

## 2014-10-06 DIAGNOSIS — E785 Hyperlipidemia, unspecified: Secondary | ICD-10-CM | POA: Diagnosis not present

## 2014-10-06 DIAGNOSIS — F329 Major depressive disorder, single episode, unspecified: Secondary | ICD-10-CM | POA: Diagnosis not present

## 2014-10-06 DIAGNOSIS — T84092D Other mechanical complication of internal right knee prosthesis, subsequent encounter: Secondary | ICD-10-CM | POA: Diagnosis not present

## 2014-10-06 DIAGNOSIS — E119 Type 2 diabetes mellitus without complications: Secondary | ICD-10-CM | POA: Diagnosis not present

## 2014-10-06 DIAGNOSIS — I10 Essential (primary) hypertension: Secondary | ICD-10-CM | POA: Diagnosis not present

## 2014-10-06 DIAGNOSIS — H409 Unspecified glaucoma: Secondary | ICD-10-CM | POA: Diagnosis not present

## 2014-10-07 DIAGNOSIS — Z471 Aftercare following joint replacement surgery: Secondary | ICD-10-CM | POA: Diagnosis not present

## 2014-10-07 DIAGNOSIS — I10 Essential (primary) hypertension: Secondary | ICD-10-CM | POA: Diagnosis not present

## 2014-10-07 DIAGNOSIS — J449 Chronic obstructive pulmonary disease, unspecified: Secondary | ICD-10-CM | POA: Diagnosis not present

## 2014-10-07 DIAGNOSIS — K219 Gastro-esophageal reflux disease without esophagitis: Secondary | ICD-10-CM | POA: Diagnosis not present

## 2014-10-07 DIAGNOSIS — H409 Unspecified glaucoma: Secondary | ICD-10-CM | POA: Diagnosis not present

## 2014-10-07 DIAGNOSIS — F329 Major depressive disorder, single episode, unspecified: Secondary | ICD-10-CM | POA: Diagnosis not present

## 2014-10-07 DIAGNOSIS — E119 Type 2 diabetes mellitus without complications: Secondary | ICD-10-CM | POA: Diagnosis not present

## 2014-10-07 DIAGNOSIS — E785 Hyperlipidemia, unspecified: Secondary | ICD-10-CM | POA: Diagnosis not present

## 2014-10-07 DIAGNOSIS — E079 Disorder of thyroid, unspecified: Secondary | ICD-10-CM | POA: Diagnosis not present

## 2014-10-07 DIAGNOSIS — T84092D Other mechanical complication of internal right knee prosthesis, subsequent encounter: Secondary | ICD-10-CM | POA: Diagnosis not present

## 2014-10-08 NOTE — Discharge Summary (Signed)
Physician Discharge Summary   Patient ID: Mercedes Barr MRN: [id number] DOB/AGE: October 19, 1945 69 y.o.  Admit date: 09/24/2014 Discharge date: 09/27/2014  Primary Diagnosis:  Failed loose right total knee arthroplasty. Admission Diagnoses:  Past Medical History  Diagnosis Date  . Diabetes mellitus   . Hypertension   . Hypothyroidism   . COPD (chronic obstructive pulmonary disease)   . Heart murmur   . GERD (gastroesophageal reflux disease)   . 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  OFFICE NOTE DR. A. LITTLE STATES  "EXTREMELY PACEMAKER SENSITIVE AND WHEN YOU TRY TO CHECK FOR UNDERLYING RHYTHMS SHE WILL HAVE SNYCOPE AND WE HAVE NOT DONE THIS NOW IN ABOUT 2 YEARS".  . Normal coronary arteries 2011  . Complete heart block   . Coronary artery disease   . Shortness of breath dyspnea     walking distance or climbing stairs  . Asthma    Discharge Diagnoses:   Principal Problem:   Failed total right knee replacement Active Problems:   OA (osteoarthritis) of knee  Estimated body mass index is 25.05 kg/(m^2) as calculated from the following:   Height as of this encounter: 5' 2"  (1.575 m).   Weight as of this encounter: 62.143 kg (137 lb).  Procedure:  Procedure(s) (LRB): RIGHT TOTAL KNEE ARTHROPLASTY REVISION (Right)   Consults: None  HPI: Mercedes Barr is a 69 year old female, who underwent a primary right total knee arthroplasty about a year and a half ago. Did very well initially and then had an episode where she started to develop increased pain. She has had progressively worsening pain since. Her blood work was negative for any infection. She had a bone scan, which did show increased activity, especially around the tibia. She has failed bracing and other nonoperative measures. She presents now for total knee arthroplasty revision.  Laboratory Data: Admission on 09/24/2014, Discharged on  09/27/2014  Component Date Value Ref Range Status  . Glucose-Capillary 09/24/2014 255* 70 - 99 mg/dL Final  . Comment 1 09/24/2014 Notify RN   Final  . Glucose-Capillary 09/24/2014 236* 70 - 99 mg/dL Final  . Glucose-Capillary 09/24/2014 171* 70 - 99 mg/dL Final  . Comment 1 09/24/2014 Notify RN   Final  . Comment 2 09/24/2014 Document in Chart   Final  . WBC 09/25/2014 9.0  4.0 - 10.5 K/uL Final  . RBC 09/25/2014 3.84* 3.87 - 5.11 MIL/uL Final  . Hemoglobin 09/25/2014 10.7* 12.0 - 15.0 g/dL Final  . HCT 09/25/2014 32.5* 36.0 - 46.0 % Final  . MCV 09/25/2014 84.6  78.0 - 100.0 fL Final  . MCH 09/25/2014 27.9  26.0 - 34.0 pg Final  . MCHC 09/25/2014 32.9  30.0 - 36.0 g/dL Final  . RDW 09/25/2014 12.5  11.5 - 15.5 % Final  . Platelets 09/25/2014 206  150 - 400 K/uL Final  . Sodium 09/25/2014 137  135 - 145 mmol/L Final  . Potassium 09/25/2014 4.1  3.5 - 5.1 mmol/L Final  . Chloride 09/25/2014 98  96 - 112 mmol/L Final  . CO2 09/25/2014 29  19 - 32 mmol/L Final  . Glucose, Bld 09/25/2014 260* 70 - 99 mg/dL Final  . BUN 09/25/2014 11  6 - 23 mg/dL Final  . Creatinine, Ser 09/25/2014 0.69  0.50 - 1.10 mg/dL Final  . Calcium 09/25/2014 8.7  8.4 - 10.5 mg/dL Final  . GFR calc non Af Wyvonnia Lora 09/25/2014  88* >90 mL/min Final  . GFR calc Af Amer 09/25/2014 >90  >90 mL/min Final   Comment: (NOTE) The eGFR has been calculated using the CKD EPI equation. This calculation has not been validated in all clinical situations. eGFR's persistently <90 mL/min signify possible Chronic Kidney Disease.   . Anion gap 09/25/2014 10  5 - 15 Final  . Glucose-Capillary 09/24/2014 185* 70 - 99 mg/dL Final  . Comment 1 09/24/2014 Notify RN   Final  . Comment 2 09/24/2014 Document in Chart   Final  . Glucose-Capillary 09/24/2014 224* 70 - 99 mg/dL Final  . Glucose-Capillary 09/24/2014 210* 70 - 99 mg/dL Final  . Glucose-Capillary 09/25/2014 248* 70 - 99 mg/dL Final  . Glucose-Capillary 09/25/2014 219* 70 - 99  mg/dL Final  . WBC 09/26/2014 10.3  4.0 - 10.5 K/uL Final  . RBC 09/26/2014 3.28* 3.87 - 5.11 MIL/uL Final  . Hemoglobin 09/26/2014 9.2* 12.0 - 15.0 g/dL Final  . HCT 09/26/2014 28.2* 36.0 - 46.0 % Final  . MCV 09/26/2014 86.0  78.0 - 100.0 fL Final  . MCH 09/26/2014 28.0  26.0 - 34.0 pg Final  . MCHC 09/26/2014 32.6  30.0 - 36.0 g/dL Final  . RDW 09/26/2014 12.7  11.5 - 15.5 % Final  . Platelets 09/26/2014 178  150 - 400 K/uL Final  . Sodium 09/26/2014 140  135 - 145 mmol/L Final  . Potassium 09/26/2014 4.0  3.5 - 5.1 mmol/L Final  . Chloride 09/26/2014 105  96 - 112 mmol/L Final  . CO2 09/26/2014 28  19 - 32 mmol/L Final  . Glucose, Bld 09/26/2014 244* 70 - 99 mg/dL Final  . BUN 09/26/2014 11  6 - 23 mg/dL Final  . Creatinine, Ser 09/26/2014 0.71  0.50 - 1.10 mg/dL Final  . Calcium 09/26/2014 8.5  8.4 - 10.5 mg/dL Final  . GFR calc non Af Amer 09/26/2014 87* >90 mL/min Final  . GFR calc Af Amer 09/26/2014 >90  >90 mL/min Final   Comment: (NOTE) The eGFR has been calculated using the CKD EPI equation. This calculation has not been validated in all clinical situations. eGFR's persistently <90 mL/min signify possible Chronic Kidney Disease.   . Anion gap 09/26/2014 7  5 - 15 Final  . Glucose-Capillary 09/25/2014 195* 70 - 99 mg/dL Final  . Glucose-Capillary 09/25/2014 226* 70 - 99 mg/dL Final  . Comment 1 09/25/2014 Notify RN   Final  . Comment 2 09/25/2014 Document in Chart   Final  . Glucose-Capillary 09/26/2014 223* 70 - 99 mg/dL Final  . Comment 1 09/26/2014 Notify RN   Final  . Comment 2 09/26/2014 Document in Chart   Final  . Glucose-Capillary 09/26/2014 155* 70 - 99 mg/dL Final  . Comment 1 09/26/2014 Notify RN   Final  . Comment 2 09/26/2014 Document in Chart   Final  . Glucose-Capillary 09/26/2014 273* 70 - 99 mg/dL Final  . Comment 1 09/26/2014 Notify RN   Final  . Comment 2 09/26/2014 Document in Chart   Final  . WBC 09/27/2014 10.9* 4.0 - 10.5 K/uL Final  . RBC  09/27/2014 3.17* 3.87 - 5.11 MIL/uL Final  . Hemoglobin 09/27/2014 8.6* 12.0 - 15.0 g/dL Final  . HCT 09/27/2014 27.2* 36.0 - 46.0 % Final  . MCV 09/27/2014 85.8  78.0 - 100.0 fL Final  . MCH 09/27/2014 27.1  26.0 - 34.0 pg Final  . MCHC 09/27/2014 31.6  30.0 - 36.0 g/dL Final  . RDW 09/27/2014 12.6  11.5 - 15.5 % Final  . Platelets 09/27/2014 187  150 - 400 K/uL Final  . Glucose-Capillary 09/26/2014 156* 70 - 99 mg/dL Final  . Glucose-Capillary 09/27/2014 167* 70 - 99 mg/dL Final  Hospital Outpatient Visit on 09/18/2014  Component Date Value Ref Range Status  . MRSA, PCR 09/18/2014 NEGATIVE  NEGATIVE Final  . Staphylococcus aureus 09/18/2014 NEGATIVE  NEGATIVE Final   Comment:        The Xpert SA Assay (FDA approved for NASAL specimens in patients over 32 years of age), is one component of a comprehensive surveillance program.  Test performance has been validated by [facility] for patients greater than or equal to 36 year old. It is not intended to diagnose infection nor to guide or monitor treatment.   Marland Kitchen aPTT 09/18/2014 31  24 - 37 seconds Final  . WBC 09/18/2014 6.3  4.0 - 10.5 K/uL Final  . RBC 09/18/2014 4.56  3.87 - 5.11 MIL/uL Final  . Hemoglobin 09/18/2014 12.5  12.0 - 15.0 g/dL Final  . HCT 09/18/2014 39.2  36.0 - 46.0 % Final  . MCV 09/18/2014 86.0  78.0 - 100.0 fL Final  . MCH 09/18/2014 27.4  26.0 - 34.0 pg Final  . MCHC 09/18/2014 31.9  30.0 - 36.0 g/dL Final  . RDW 09/18/2014 12.8  11.5 - 15.5 % Final  . Platelets 09/18/2014 234  150 - 400 K/uL Final  . Sodium 09/18/2014 140  135 - 145 mmol/L Final  . Potassium 09/18/2014 3.5  3.5 - 5.1 mmol/L Final  . Chloride 09/18/2014 104  96 - 112 mmol/L Final  . CO2 09/18/2014 28  19 - 32 mmol/L Final  . Glucose, Bld 09/18/2014 257* 70 - 99 mg/dL Final  . BUN 09/18/2014 11  6 - 23 mg/dL Final  . Creatinine, Ser 09/18/2014 0.74  0.50 - 1.10 mg/dL Final  . Calcium 09/18/2014 9.4  8.4 - 10.5 mg/dL Final  . Total Protein  09/18/2014 7.3  6.0 - 8.3 g/dL Final  . Albumin 09/18/2014 4.2  3.5 - 5.2 g/dL Final  . AST 09/18/2014 26  0 - 37 U/L Final  . ALT 09/18/2014 18  0 - 35 U/L Final  . Alkaline Phosphatase 09/18/2014 37* 39 - 117 U/L Final  . Total Bilirubin 09/18/2014 0.5  0.3 - 1.2 mg/dL Final  . GFR calc non Af Amer 09/18/2014 86* >90 mL/min Final  . GFR calc Af Amer 09/18/2014 >90  >90 mL/min Final   Comment: (NOTE) The eGFR has been calculated using the CKD EPI equation. This calculation has not been validated in all clinical situations. eGFR's persistently <90 mL/min signify possible Chronic Kidney Disease.   . Anion gap 09/18/2014 8  5 - 15 Final  . Prothrombin Time 09/18/2014 13.7  11.6 - 15.2 seconds Final  . INR 09/18/2014 1.04  0.00 - 1.49 Final  . ABO/RH(D) 09/18/2014 O POS   Final  . Antibody Screen 09/18/2014 NEG   Final  . Sample Expiration 09/18/2014 09/27/2014   Final  . Color, Urine 09/18/2014 YELLOW  YELLOW Final  . APPearance 09/18/2014 CLOUDY* CLEAR Final  . Specific Gravity, Urine 09/18/2014 1.043* 1.005 - 1.030 Final  . pH 09/18/2014 5.0  5.0 - 8.0 Final  . Glucose, UA 09/18/2014 >1000* NEGATIVE mg/dL Final  . Hgb urine dipstick 09/18/2014 NEGATIVE  NEGATIVE Final  . Bilirubin Urine 09/18/2014 SMALL* NEGATIVE Final  . Ketones, ur 09/18/2014 NEGATIVE  NEGATIVE mg/dL Final  . Protein, ur 09/18/2014 NEGATIVE  NEGATIVE mg/dL Final  . Urobilinogen, UA 09/18/2014 0.2  0.0 - 1.0 mg/dL Final  . Nitrite 09/18/2014 NEGATIVE  NEGATIVE Final  . Leukocytes, UA 09/18/2014 NEGATIVE  NEGATIVE Final  . Squamous Epithelial / LPF 09/18/2014 RARE  RARE Final  . WBC, UA 09/18/2014 0-2  <3 WBC/hpf Final  . RBC / HPF 09/18/2014 0-2  <3 RBC/hpf Final  . Crystals 09/18/2014 CA OXALATE CRYSTALS* NEGATIVE Final  . Urine-Other 09/18/2014 AMORPHOUS URATES/PHOSPHATES   Final   MUCOUS PRESENT  Office Visit on 08/18/2014  Component Date Value Ref Range Status  . Hemoglobin A1C 08/18/2014 7.4   Final  .  Sodium 08/18/2014 143  135 - 145 mEq/L Final  . Potassium 08/18/2014 3.8  3.5 - 5.3 mEq/L Final  . Chloride 08/18/2014 100  96 - 112 mEq/L Final  . CO2 08/18/2014 32  19 - 32 mEq/L Final  . Glucose, Bld 08/18/2014 178* 70 - 99 mg/dL Final  . BUN 08/18/2014 12  6 - 23 mg/dL Final  . Creat 08/18/2014 0.81  0.50 - 1.10 mg/dL Final  . Calcium 08/18/2014 9.9  8.4 - 10.5 mg/dL Final  . Cholesterol 08/18/2014 197  0 - 200 mg/dL Final   Comment: ATP III Classification:       < 200        mg/dL        Desirable      200 - 239     mg/dL        Borderline High      >= 240        mg/dL        High     . Triglycerides 08/18/2014 202* <150 mg/dL Final  . HDL 08/18/2014 35* >39 mg/dL Final  . Total CHOL/HDL Ratio 08/18/2014 5.6   Final  . VLDL 08/18/2014 40  0 - 40 mg/dL Final  . LDL Cholesterol 08/18/2014 122* 0 - 99 mg/dL Final   Comment:   Total Cholesterol/HDL Ratio:CHD Risk                        Coronary Heart Disease Risk Table                                        Men       Women          1/2 Average Risk              3.4        3.3              Average Risk              5.0        4.4           2X Average Risk              9.6        7.1           3X Average Risk             23.4       11.0 Use the calculated Patient Ratio above and the CHD Risk table  to determine the patient's CHD Risk. ATP III Classification (LDL):       < 100        mg/dL  Optimal      100 - 129     mg/dL         Near or Above Optimal      130 - 159     mg/dL         Borderline High      160 - 189     mg/dL         High       > 190        mg/dL         Very High        X-Rays:Dg Chest 2 View  09/18/2014   CLINICAL DATA:  Preop right total knee surgery 09/14/2014 with history of COPD  EXAM: CHEST  2 VIEW  COMPARISON:  11/12/2012  FINDINGS: Heart size and vascular pattern are normal. Cardiac pacer identified with generator over the left thorax. Numerous leaves from prior pacer over the right thorax also  stable.  Lungs are clear.  Bony thorax intact.  No pleural effusion.  IMPRESSION: No active cardiopulmonary disease.   Electronically Signed   By: Skipper Cliche M.D.   On: 09/18/2014 17:36    EKG: Orders placed or performed in visit on 04/29/14  . EKG 12-Lead     Hospital Course: Mercedes Barr is a 69 y.o. who was admitted to [facility]. They were brought to the operating room on 09/24/2014 and underwent Procedure(s): RIGHT TOTAL KNEE ARTHROPLASTY REVISION.  Patient tolerated the procedure well and was later transferred to the recovery room and then to the orthopaedic floor for postoperative care.  They were given PO and IV analgesics for pain control following their surgery.  They were given 24 hours of postoperative antibiotics of  Anti-infectives    Start     Dose/Rate Route Frequency Ordered Stop   09/24/14 2100  ceFAZolin (ANCEF) IVPB 1 g/50 mL premix     1 g 100 mL/hr over 30 Minutes Intravenous Every 6 hours 09/24/14 1748 09/25/14 0350   09/24/14 1215  ceFAZolin (ANCEF) IVPB 2 g/50 mL premix     2 g 100 mL/hr over 30 Minutes Intravenous On call to O.R. 09/24/14 1200 09/24/14 1432     and started on DVT prophylaxis in the form of Xarelto.   PT and OT were ordered for total joint protocol.  Discharge planning consulted to help with postop disposition and equipment needs.  Patient had a tough night on the evening of surgery and main complaint was back pain.  They started to get up OOB with therapy on day one.  Ran low pressures and nausea and needed additional fluids.  Hemovac drain was pulled without difficulty.  Continued to work with therapy into day two.  Dressing was changed on day two and the incision was healing well.  Given further fluids on day two and monitored for further nausea. Improved. Patient was seen in rounds on day three and was ready to go home.   Diet: Cardiac diet and Diabetic diet Activity:WBAT Follow-up:in 2 weeks Disposition - Home Discharged  Condition: improving       Discharge Instructions    Call MD / Call 911    Complete by:  As directed   If you experience chest pain or shortness of breath, CALL 911 and be transported to the hospital emergency room.  If you develope a fever above 101 F, pus (white drainage) or increased drainage or redness at the wound, or calf pain, call your surgeon's office.  Change dressing    Complete by:  As directed   Daily dressing changes with gauze and tape. Keep the area clean.  Call with any questions or concerns.     Constipation Prevention    Complete by:  As directed   Drink plenty of fluids.  Prune juice may be helpful.  You may use a stool softener, such as Colace (over the counter) 100 mg twice a day.  Use MiraLax (over the counter) for constipation as needed.     Diet - low sodium heart healthy    Complete by:  As directed      Discharge instructions    Complete by:  As directed   Daily dressing changes with 4x4 gauze and tape.  Keep the area clean.  May shower with without covering the incision site 3 days after surgery.   Follow up in 2 weeks at [facility]. Call with any questions or concerns.     Driving restrictions    Complete by:  As directed   No driving for 4 weeks     Increase activity slowly as tolerated    Complete by:  As directed      TED hose    Complete by:  As directed   Use stockings (TED hose) for 2 weeks on both leg(s).  You may remove them at night for sleeping.     Weight bearing as tolerated    Complete by:  As directed   Laterality:  right  Extremity:  Lower            Medication List    STOP taking these medications        aspirin 81 MG EC tablet  Commonly known as:  ASPIRIN LOW DOSE     traMADol 50 MG tablet  Commonly known as:  ULTRAM      TAKE these medications        albuterol 108 (90 BASE) MCG/ACT inhaler  Commonly known as:  PROVENTIL HFA;VENTOLIN HFA  Inhale 2 puffs into the lungs every 4 (four) hours as needed for  wheezing.     albuterol (2.5 MG/3ML) 0.083% nebulizer solution  Commonly known as:  PROVENTIL  Take 3 mLs (2.5 mg total) by nebulization every 4 (four) hours as needed for wheezing. As Needed.     busPIRone 30 MG tablet  Commonly known as:  BUSPAR  Take 1 tablet (30 mg total) by mouth 2 (two) times daily as needed (Anxiety). Order number [id number]     calcium-vitamin D 500-200 MG-UNIT per tablet  Take 1 tablet by mouth daily.     citalopram 20 MG tablet  Commonly known as:  CELEXA  Take 1 tablet (20 mg total) by mouth every morning. Order number [id number]     docusate sodium 100 MG capsule  Commonly known as:  COLACE  Take 1 capsule (100 mg total) by mouth 2 (two) times daily.     EPINEPHrine 0.3 mg/0.3 mL Soaj injection  Commonly known as:  EPI-PEN  Inject 0.3 mLs (0.3 mg total) into the muscle as needed. For severe allergic reaction, and contact medical provider.     Fish Oil 1000 MG Caps  Take 1 capsule by mouth every morning.     fluticasone 110 MCG/ACT inhaler  Commonly known as:  FLOVENT HFA  Inhale 1 puff into the lungs 2 (two) times daily.     furosemide 20 MG tablet  Commonly known as:  LASIX  TAKE 1 TABLET BY MOUTH EVERY  MORNING     glipiZIDE 10 MG 24 hr tablet  Commonly known as:  GLUCOTROL XL  Take 1 tablet (10 mg total) by mouth daily with breakfast.     glucose blood test strip  Use for blood sugar testing 1-3 times daily.     glyBURIDE-metformin 5-500 MG per tablet  Commonly known as:  GLUCOVANCE  Take 1 tablet by mouth 2 (two) times daily.     glycerin adult 2 G Supp  Commonly known as:  glycerin adult  Place 1 suppository rectally daily as needed. For constipation     HYDROcodone-acetaminophen 5-325 MG per tablet  Commonly known as:  NORCO/VICODIN  Take 1-2 tablets by mouth every 4 (four) hours as needed for moderate pain.     Lancets Misc  Use with blood sugar monitor.     levothyroxine 75 MCG tablet  Commonly known as:  SYNTHROID,  LEVOTHROID  Take 1 tablet (75 mcg total) by mouth daily before breakfast. Order number [id number]. Needs thyroid check.     losartan 50 MG tablet  Commonly known as:  COZAAR  Take 1 tablet (50 mg total) by mouth daily. Order number [id number]     LYRICA 75 MG capsule  Generic drug:  pregabalin  Take 75 mg by mouth 2 (two) times daily.     metFORMIN 500 MG tablet  Commonly known as:  GLUCOPHAGE  Take 500 mg by mouth 2 (two) times daily with a meal.     methocarbamol 500 MG tablet  Commonly known as:  ROBAXIN  Take 1 tablet (500 mg total) by mouth every 6 (six) hours as needed for muscle spasms.     omeprazole 20 MG capsule  Commonly known as:  PRILOSEC  TAKE 1 CAPSULE BY MOUTH TWICE DAILY     ondansetron 4 MG tablet  Commonly known as:  ZOFRAN  Take 1 tablet (4 mg total) by mouth every 8 (eight) hours as needed for nausea.     polyethylene glycol powder powder  Commonly known as:  GLYCOLAX/MIRALAX  Take 17 g by mouth 2 (two) times daily.     potassium chloride SA 20 MEQ tablet  Commonly known as:  K-DUR,KLOR-CON  Take 1 tablet (20 mEq total) by mouth daily.     propranolol 40 MG tablet  Commonly known as:  INDERAL  Take 1 tablet (40 mg total) by mouth daily.     rivaroxaban 10 MG Tabs tablet  Commonly known as:  XARELTO  Take 1 tablet (10 mg total) by mouth daily with breakfast.     rosuvastatin 20 MG tablet  Commonly known as:  CRESTOR  Take 1 tablet (20 mg total) by mouth daily.     sitaGLIPtin 100 MG tablet  Commonly known as:  JANUVIA  Take 1 tablet (100 mg total) by mouth every morning.     Travoprost (BAK Free) 0.004 % Soln ophthalmic solution  Commonly known as:  TRAVATAN  Place 1 drop into both eyes at bedtime.     traZODone 50 MG tablet  Commonly known as:  DESYREL  Take 0.5 tablets (25 mg total) by mouth at bedtime.     triamcinolone cream 0.5 %  Commonly known as:  KENALOG  APPLY TOPICALLY TO ITCHY AREAS TWICE DAILY AS NEEDED FOR ITCHING UNTIL 2 TO  3 DAYS AFTER HEALED; Order number [id number]       Follow-up Information    Follow up with [facility].   Why:  home health physical therapy [name]  has been requested for you)   Contact information:   [place] [address] [phone]       Follow up with Gearlean Alf, MD. Schedule an appointment as soon as possible for a visit in 2 weeks.   Specialty:  Orthopedic Surgery   Contact information:   [address] [place] [address] [phone]       Signed: [name], PA-C Orthopaedic Surgery 10/08/2014, 10:13 PM

## 2014-10-10 DIAGNOSIS — E079 Disorder of thyroid, unspecified: Secondary | ICD-10-CM | POA: Diagnosis not present

## 2014-10-10 DIAGNOSIS — Z471 Aftercare following joint replacement surgery: Secondary | ICD-10-CM | POA: Diagnosis not present

## 2014-10-10 DIAGNOSIS — E785 Hyperlipidemia, unspecified: Secondary | ICD-10-CM | POA: Diagnosis not present

## 2014-10-10 DIAGNOSIS — H409 Unspecified glaucoma: Secondary | ICD-10-CM | POA: Diagnosis not present

## 2014-10-10 DIAGNOSIS — K219 Gastro-esophageal reflux disease without esophagitis: Secondary | ICD-10-CM | POA: Diagnosis not present

## 2014-10-10 DIAGNOSIS — J449 Chronic obstructive pulmonary disease, unspecified: Secondary | ICD-10-CM | POA: Diagnosis not present

## 2014-10-10 DIAGNOSIS — E119 Type 2 diabetes mellitus without complications: Secondary | ICD-10-CM | POA: Diagnosis not present

## 2014-10-10 DIAGNOSIS — F329 Major depressive disorder, single episode, unspecified: Secondary | ICD-10-CM | POA: Diagnosis not present

## 2014-10-10 DIAGNOSIS — T84092D Other mechanical complication of internal right knee prosthesis, subsequent encounter: Secondary | ICD-10-CM | POA: Diagnosis not present

## 2014-10-10 DIAGNOSIS — I10 Essential (primary) hypertension: Secondary | ICD-10-CM | POA: Diagnosis not present

## 2014-10-14 DIAGNOSIS — E785 Hyperlipidemia, unspecified: Secondary | ICD-10-CM | POA: Diagnosis not present

## 2014-10-14 DIAGNOSIS — K219 Gastro-esophageal reflux disease without esophagitis: Secondary | ICD-10-CM | POA: Diagnosis not present

## 2014-10-14 DIAGNOSIS — E119 Type 2 diabetes mellitus without complications: Secondary | ICD-10-CM | POA: Diagnosis not present

## 2014-10-14 DIAGNOSIS — Z471 Aftercare following joint replacement surgery: Secondary | ICD-10-CM | POA: Diagnosis not present

## 2014-10-14 DIAGNOSIS — T84092D Other mechanical complication of internal right knee prosthesis, subsequent encounter: Secondary | ICD-10-CM | POA: Diagnosis not present

## 2014-10-14 DIAGNOSIS — J449 Chronic obstructive pulmonary disease, unspecified: Secondary | ICD-10-CM | POA: Diagnosis not present

## 2014-10-14 DIAGNOSIS — F329 Major depressive disorder, single episode, unspecified: Secondary | ICD-10-CM | POA: Diagnosis not present

## 2014-10-14 DIAGNOSIS — H409 Unspecified glaucoma: Secondary | ICD-10-CM | POA: Diagnosis not present

## 2014-10-14 DIAGNOSIS — I10 Essential (primary) hypertension: Secondary | ICD-10-CM | POA: Diagnosis not present

## 2014-10-14 DIAGNOSIS — E079 Disorder of thyroid, unspecified: Secondary | ICD-10-CM | POA: Diagnosis not present

## 2014-10-15 DIAGNOSIS — I10 Essential (primary) hypertension: Secondary | ICD-10-CM | POA: Diagnosis not present

## 2014-10-15 DIAGNOSIS — H409 Unspecified glaucoma: Secondary | ICD-10-CM | POA: Diagnosis not present

## 2014-10-15 DIAGNOSIS — J449 Chronic obstructive pulmonary disease, unspecified: Secondary | ICD-10-CM | POA: Diagnosis not present

## 2014-10-15 DIAGNOSIS — E119 Type 2 diabetes mellitus without complications: Secondary | ICD-10-CM | POA: Diagnosis not present

## 2014-10-15 DIAGNOSIS — Z471 Aftercare following joint replacement surgery: Secondary | ICD-10-CM | POA: Diagnosis not present

## 2014-10-15 DIAGNOSIS — E785 Hyperlipidemia, unspecified: Secondary | ICD-10-CM | POA: Diagnosis not present

## 2014-10-15 DIAGNOSIS — T84092D Other mechanical complication of internal right knee prosthesis, subsequent encounter: Secondary | ICD-10-CM | POA: Diagnosis not present

## 2014-10-15 DIAGNOSIS — F329 Major depressive disorder, single episode, unspecified: Secondary | ICD-10-CM | POA: Diagnosis not present

## 2014-10-15 DIAGNOSIS — E079 Disorder of thyroid, unspecified: Secondary | ICD-10-CM | POA: Diagnosis not present

## 2014-10-15 DIAGNOSIS — K219 Gastro-esophageal reflux disease without esophagitis: Secondary | ICD-10-CM | POA: Diagnosis not present

## 2014-10-20 DIAGNOSIS — K219 Gastro-esophageal reflux disease without esophagitis: Secondary | ICD-10-CM | POA: Diagnosis not present

## 2014-10-20 DIAGNOSIS — E119 Type 2 diabetes mellitus without complications: Secondary | ICD-10-CM | POA: Diagnosis not present

## 2014-10-20 DIAGNOSIS — J449 Chronic obstructive pulmonary disease, unspecified: Secondary | ICD-10-CM | POA: Diagnosis not present

## 2014-10-20 DIAGNOSIS — I10 Essential (primary) hypertension: Secondary | ICD-10-CM | POA: Diagnosis not present

## 2014-10-20 DIAGNOSIS — E079 Disorder of thyroid, unspecified: Secondary | ICD-10-CM | POA: Diagnosis not present

## 2014-10-20 DIAGNOSIS — E785 Hyperlipidemia, unspecified: Secondary | ICD-10-CM | POA: Diagnosis not present

## 2014-10-20 DIAGNOSIS — H409 Unspecified glaucoma: Secondary | ICD-10-CM | POA: Diagnosis not present

## 2014-10-20 DIAGNOSIS — F329 Major depressive disorder, single episode, unspecified: Secondary | ICD-10-CM | POA: Diagnosis not present

## 2014-10-20 DIAGNOSIS — Z471 Aftercare following joint replacement surgery: Secondary | ICD-10-CM | POA: Diagnosis not present

## 2014-10-20 DIAGNOSIS — T84092D Other mechanical complication of internal right knee prosthesis, subsequent encounter: Secondary | ICD-10-CM | POA: Diagnosis not present

## 2014-10-23 DIAGNOSIS — M5137 Other intervertebral disc degeneration, lumbosacral region: Secondary | ICD-10-CM | POA: Diagnosis not present

## 2014-11-04 DIAGNOSIS — Z471 Aftercare following joint replacement surgery: Secondary | ICD-10-CM | POA: Diagnosis not present

## 2014-11-04 DIAGNOSIS — Z96651 Presence of right artificial knee joint: Secondary | ICD-10-CM | POA: Diagnosis not present

## 2014-11-05 ENCOUNTER — Ambulatory Visit (INDEPENDENT_AMBULATORY_CARE_PROVIDER_SITE_OTHER): Payer: Medicare Other | Admitting: Cardiovascular Disease

## 2014-11-05 ENCOUNTER — Encounter: Payer: Self-pay | Admitting: Cardiovascular Disease

## 2014-11-05 ENCOUNTER — Other Ambulatory Visit: Payer: Self-pay

## 2014-11-05 VITALS — BP 120/62 | HR 82 | Resp 16

## 2014-11-05 DIAGNOSIS — Z1231 Encounter for screening mammogram for malignant neoplasm of breast: Secondary | ICD-10-CM

## 2014-11-05 DIAGNOSIS — I442 Atrioventricular block, complete: Secondary | ICD-10-CM | POA: Diagnosis not present

## 2014-11-05 DIAGNOSIS — I10 Essential (primary) hypertension: Secondary | ICD-10-CM | POA: Diagnosis not present

## 2014-11-05 DIAGNOSIS — E785 Hyperlipidemia, unspecified: Secondary | ICD-10-CM | POA: Diagnosis not present

## 2014-11-05 DIAGNOSIS — Z950 Presence of cardiac pacemaker: Secondary | ICD-10-CM | POA: Diagnosis not present

## 2014-11-05 DIAGNOSIS — Z0389 Encounter for observation for other suspected diseases and conditions ruled out: Secondary | ICD-10-CM

## 2014-11-05 DIAGNOSIS — IMO0001 Reserved for inherently not codable concepts without codable children: Secondary | ICD-10-CM

## 2014-11-05 DIAGNOSIS — Z79899 Other long term (current) drug therapy: Secondary | ICD-10-CM | POA: Diagnosis not present

## 2014-11-05 LAB — MDC_IDC_ENUM_SESS_TYPE_INCLINIC
Battery Impedance: 1853 Ohm
Battery Remaining Longevity: 23 mo
Brady Statistic AP VS Percent: 0 %
Brady Statistic AS VP Percent: 20 %
Date Time Interrogation Session: 20160330141249
Lead Channel Impedance Value: 612 Ohm
Lead Channel Pacing Threshold Amplitude: 0.5 V
Lead Channel Pacing Threshold Amplitude: 1 V
Lead Channel Setting Pacing Amplitude: 2 V
Lead Channel Setting Pacing Amplitude: 2.5 V
Lead Channel Setting Pacing Pulse Width: 0.4 ms
Lead Channel Setting Sensing Sensitivity: 2.8 mV
MDC IDC MSMT BATTERY VOLTAGE: 2.73 V
MDC IDC MSMT LEADCHNL RA IMPEDANCE VALUE: 651 Ohm
MDC IDC MSMT LEADCHNL RA PACING THRESHOLD PULSEWIDTH: 0.4 ms
MDC IDC MSMT LEADCHNL RA SENSING INTR AMPL: 2 mV
MDC IDC MSMT LEADCHNL RV PACING THRESHOLD PULSEWIDTH: 0.4 ms
MDC IDC STAT BRADY AP VP PERCENT: 80 %
MDC IDC STAT BRADY AS VS PERCENT: 0 %

## 2014-11-05 NOTE — Progress Notes (Signed)
Patient ID: Mercedes Barr, female   DOB: 1945-11-04, 69 y.o.   MRN: [id number]     Cardiology Office Note   Date:  11/08/2014   ID:  Mercedes Barr, DOB 1946-04-13, MRN [id number]  PCP:  Conni Slipper, MD  Cardiologist:   Sanda Klein, MD   Chief Complaint  Patient presents with  . Follow-up    6 months. recent knee replacement. no complaints      History of Present Illness: Mercedes Barr is a 69 y.o. female who presents for pacemaker check (complete heart block, pacer dependent, Medtronic Adapta 2008) and coronary risk factor management.  No serious problems since her last appointment. She had an uneventful revision of her  right total knee replacement and is undergoing ambulatory PT.  She had surgical repair of atrioventricular cushion defect repair in 1988 when she was 69 years old. Surgery was complicated by development of complete heart block and she received a pacemaker (her initial device probably was an abdominal generator with epicardial leads). A right subclavian pacemaker was placed in 1991, but had to be removed because of complaints of pain. She had a complete revision of the system in 1999 and her current left subclavian atrial and ventricular leads date to that time. Her most recent generator change out occurred in 2008. The atrial lead is a Medtronic [id number]. The ventricular lead is Medtronic [id number]. The current generator is a Medtronic Adapta.  She has no cardiac complaints.   Pacemaker check shows normal function. She has 80% A pacing and 100% V pacing. Estimated battery longevity is 23 months. She has no escape rhythm.  Past Medical History  Diagnosis Date  . Diabetes mellitus   . Hypertension   . Hypothyroidism   . COPD (chronic obstructive pulmonary disease)   . Heart murmur   . GERD (gastroesophageal reflux disease)   . 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 Alsen.  OFFICE NOTE DR. A.  LITTLE STATES  "EXTREMELY PACEMAKER SENSITIVE AND WHEN YOU TRY TO CHECK FOR UNDERLYING RHYTHMS SHE WILL HAVE SNYCOPE AND WE HAVE NOT DONE THIS NOW IN ABOUT 2 YEARS".  . Normal coronary arteries 2011  . Complete heart block   . Coronary artery disease   . Shortness of breath dyspnea     walking distance or climbing stairs  . Asthma     Past Surgical History  Procedure Laterality Date  . Pacemaker insertion  1988    last gen 11/08- MDT  . Tubal ligation    . Cardiac catheterization  2011     SHOWED NO CAD-PER CARDIOLOGY OFFICE NOTES DR. A. LITTLE  . Atrioventricular cushion defect repair  1988  . Total knee arthroplasty Right 11/19/2012    Procedure: RIGHT TOTAL KNEE ARTHROPLASTY;  Surgeon: Gearlean Alf, MD;  Location: [facility];  Service: Orthopedics;  Laterality: Right;  . Insert / replace / remove pacemaker    . Colonscopy       removed polyps   . Total knee revision Right 09/24/2014    Procedure: RIGHT TOTAL KNEE ARTHROPLASTY REVISION;  Surgeon: Gearlean Alf, MD;  Location: [facility];  Service: Orthopedics;  Laterality: Right;     Current Outpatient Prescriptions  Medication Sig Dispense Refill  . albuterol (PROVENTIL HFA;VENTOLIN HFA) 108 (90 BASE) MCG/ACT inhaler Inhale 2 puffs into the lungs every 4 (four) hours as needed for wheezing. 1 Inhaler 2  . albuterol (  PROVENTIL) (2.5 MG/3ML) 0.083% nebulizer solution Take 3 mLs (2.5 mg total) by nebulization every 4 (four) hours as needed for wheezing. As Needed. 75 mL 3  . busPIRone (BUSPAR) 30 MG tablet Take 1 tablet (30 mg total) by mouth 2 (two) times daily as needed (Anxiety). Order number [id number] 60 tablet 11  . Calcium Carbonate-Vitamin D (CALCIUM-VITAMIN D) 500-200 MG-UNIT per tablet Take 1 tablet by mouth daily.    . citalopram (CELEXA) 20 MG tablet Take 1 tablet (20 mg total) by mouth every morning. Order number [id number] 30 tablet 5  . docusate sodium (COLACE) 100 MG capsule Take 1 capsule (100 mg total) by mouth 2 (two)  times daily. 10 capsule 0  . EPINEPHrine (EPI-PEN) 0.3 mg/0.3 mL SOAJ injection Inject 0.3 mLs (0.3 mg total) into the muscle as needed. For severe allergic reaction, and contact medical provider. 1 Device 2  . fluticasone (FLOVENT HFA) 110 MCG/ACT inhaler Inhale 1 puff into the lungs 2 (two) times daily. 1 Inhaler 5  . furosemide (LASIX) 20 MG tablet TAKE 1 TABLET BY MOUTH EVERY MORNING 90 tablet 1  . glipiZIDE (GLUCOTROL XL) 10 MG 24 hr tablet Take 1 tablet (10 mg total) by mouth daily with breakfast. (Patient taking differently: Take 5 mg by mouth daily with breakfast. ) 90 tablet 1  . glucose blood test strip Use for blood sugar testing 1-3 times daily. 100 each 11  . glyBURIDE-metformin (GLUCOVANCE) 5-500 MG per tablet Take 1 tablet by mouth 2 (two) times daily.    Marland Kitchen glycerin adult (GLYCERIN ADULT) 2 G SUPP Place 1 suppository rectally daily as needed. For constipation 10 each 0  . HYDROcodone-acetaminophen (NORCO/VICODIN) 5-325 MG per tablet Take 1-2 tablets by mouth every 4 (four) hours as needed for moderate pain. 100 tablet 0  . Lancets MISC Use with blood sugar monitor. 100 each 11  . levothyroxine (SYNTHROID, LEVOTHROID) 75 MCG tablet Take 1 tablet (75 mcg total) by mouth daily before breakfast. Order number [id number]. Needs thyroid check. 30 tablet 2  . losartan (COZAAR) 50 MG tablet Take 1 tablet (50 mg total) by mouth daily. Order number [id number] 30 tablet 5  . LYRICA 75 MG capsule Take 75 mg by mouth 2 (two) times daily.    . metFORMIN (GLUCOPHAGE) 500 MG tablet Take 500 mg by mouth 2 (two) times daily with a meal.    . methocarbamol (ROBAXIN) 500 MG tablet Take 1 tablet (500 mg total) by mouth every 6 (six) hours as needed for muscle spasms. 80 tablet 0  . Omega-3 Fatty Acids (FISH OIL) 1000 MG CAPS Take 1 capsule by mouth every morning.    Marland Kitchen omeprazole (PRILOSEC) 20 MG capsule TAKE 1 CAPSULE BY MOUTH TWICE DAILY 180 capsule 0  . ondansetron (ZOFRAN) 4 MG tablet Take 1 tablet (4 mg  total) by mouth every 8 (eight) hours as needed for nausea. 20 tablet 0  . polyethylene glycol powder (GLYCOLAX/MIRALAX) powder Take 17 g by mouth 2 (two) times daily. 3350 g 11  . potassium chloride SA (K-DUR,KLOR-CON) 20 MEQ tablet Take 1 tablet (20 mEq total) by mouth daily. (Patient taking differently: Take 10 mEq by mouth daily. ) 90 tablet 1  . propranolol (INDERAL) 40 MG tablet Take 1 tablet (40 mg total) by mouth daily. 90 tablet 1  . rivaroxaban (XARELTO) 10 MG TABS tablet Take 1 tablet (10 mg total) by mouth daily with breakfast. 18 tablet 0  . rosuvastatin (CRESTOR) 20 MG tablet Take 1 tablet (20  mg total) by mouth daily. 90 tablet 3  . sitaGLIPtin (JANUVIA) 100 MG tablet Take 1 tablet (100 mg total) by mouth every morning. 90 tablet 1  . Travoprost, BAK Free, (TRAVATAN) 0.004 % SOLN ophthalmic solution Place 1 drop into both eyes at bedtime. 1 Bottle 5  . traZODone (DESYREL) 50 MG tablet Take 0.5 tablets (25 mg total) by mouth at bedtime. 30 tablet 3  . triamcinolone cream (KENALOG) 0.5 % APPLY TOPICALLY TO ITCHY AREAS TWICE DAILY AS NEEDED FOR ITCHING UNTIL 2 TO 3 DAYS AFTER HEALED; Order number [id number] 15 g 0  . [DISCONTINUED] albuterol (PROVENTIL,VENTOLIN) 90 MCG/ACT inhaler Inhale 2 puffs into the lungs every 4 (four) hours as needed for wheezing or shortness of breath. 17 g 5   No current facility-administered medications for this visit.    Allergies:   Chlorhexidine; Amitriptyline hcl; Benazepril hcl; Naproxen; Propoxyphene n-acetaminophen; Statins; and Tylox    Social History:  The patient  reports that she has never smoked. She has never used smokeless tobacco. She reports that she does not drink alcohol or use illicit drugs.   Family History:  The patient's family history includes Heart failure in her mother and sister.    ROS:  Please see the history of present illness.    The patient specifically denies any chest pain at rest or with exertion, dyspnea at rest or with  exertion, orthopnea, paroxysmal nocturnal dyspnea, syncope, palpitations, focal neurological deficits, intermittent claudication, lower extremity edema, unexplained weight gain, cough, hemoptysis or wheezing.  The patient also denies abdominal pain, nausea, vomiting, dysphagia, diarrhea, constipation, polyuria, polydipsia, dysuria, hematuria, frequency, urgency, abnormal bleeding or bruising, fever, chills, unexpected weight changes, mood swings, change in skin or hair texture, change in voice quality, auditory or visual problems, allergic reactions or rashes, new musculoskeletal complaints other than usual "aches and pains". All other systems are reviewed and negative.    PHYSICAL EXAM: VS:  BP 120/62 mmHg  Pulse 82  Resp 16 , BMI There is no weight on file to calculate BMI.  General: Alert, oriented x3, no distress Head: no evidence of trauma, PERRL, EOMI, no exophtalmos or lid lag, no myxedema, no xanthelasma; normal ears, nose and oropharynx Neck: normal jugular venous pulsations and no hepatojugular reflux; brisk carotid pulses without delay and no carotid bruits Chest: clear to auscultation, no signs of consolidation by percussion or palpation, normal fremitus, symmetrical and full respiratory excursions Cardiovascular: normal position and quality of the apical impulse, regular rhythm, normal first and second heart sounds, no murmurs, rubs or gallops Abdomen: no tenderness or distention, no masses by palpation, no abnormal pulsatility or arterial bruits, normal bowel sounds, no hepatosplenomegaly Extremities: no clubbing, cyanosis or edema; 2+ radial, ulnar and brachial pulses bilaterally; 2+ right femoral, posterior tibial and dorsalis pedis pulses; 2+ left femoral, posterior tibial and dorsalis pedis pulses; no subclavian or femoral bruits Neurological: grossly nonfocal Psych: euthymic mood, full affect   EKG:  EKG is ordered today. The ekg ordered today demonstrates AV sequential  paced   Recent Labs: 09/18/2014: ALT 18 09/26/2014: BUN 11; Creatinine 0.71; Potassium 4.0; Sodium 140 09/27/2014: Hemoglobin 8.6*; Platelets 187    Lipid Panel    Component Value Date/Time   CHOL 197 08/18/2014 1157   TRIG 202* 08/18/2014 1157   HDL 35* 08/18/2014 1157   CHOLHDL 5.6 08/18/2014 1157   VLDL 40 08/18/2014 1157   LDLCALC 122* 08/18/2014 1157   LDLDIRECT 56 06/08/2012 1120      Wt Readings  from Last 3 Encounters:  09/24/14 137 lb (62.143 kg)  09/10/14 132 lb 3.2 oz (59.966 kg)  08/18/14 137 lb (62.143 kg)     ASSESSMENT AND PLAN:  HYPERTENSION Her BP is excellent after the DC of HCTZ. Continue propranolol and losartan  Complete heart block Pacemaker dependent, occurred in 1988 after AV cushion defect repair.  Pacemaker- last gen change was a MDT Nov 2008 She has near syncope when we test for underlying rhythm, but we successfully checked thresholds on both leads. All other parameters were also normal. Continue Q6 months remote checks. Increase frequency of checks when longevity<12 months.  Has indication for lipid lowering therapy since she has DM - target LDL<100. Her Hgb A1c 7.45 too high - reevaluate lipids after improved glycemic control  Current medicines are reviewed at length with the patient today.  The patient does not have concerns regarding medicines.  The following changes have been made:  no change  Labs/ tests ordered today include:  Orders Placed This Encounter  Procedures  . Lipid panel  . Hepatic function panel  . Implantable device check   Patient Instructions  Your physician recommends that you schedule a follow-up appointment in: 6 months with Dr.Nadina Fomby + pacemaker check  Your physician recommends that you return for lab work at your Karene Fry, MD  11/08/2014 8:43 PM    Sanda Klein, MD, [facility] [phone] office [phone] pager

## 2014-11-05 NOTE — Patient Instructions (Addendum)
Your physician recommends that you schedule a follow-up appointment in: 6 months with Dr.Croitoru + pacemaker check  Your physician recommends that you return for lab work at your [facility]

## 2014-11-08 ENCOUNTER — Encounter: Payer: Self-pay | Admitting: Cardiovascular Disease

## 2014-11-10 NOTE — Addendum Note (Signed)
Addended by: Fidel Levy on: 11/10/2014 08:28 AM   Modules accepted: Orders

## 2014-11-18 ENCOUNTER — Encounter: Payer: Self-pay | Admitting: Cardiovascular Disease

## 2014-11-20 ENCOUNTER — Telehealth: Payer: Self-pay | Admitting: Family Medicine

## 2014-11-20 DIAGNOSIS — M159 Polyosteoarthritis, unspecified: Secondary | ICD-10-CM

## 2014-11-20 NOTE — Telephone Encounter (Signed)
Covering for Dr. Dianah Field. Received fax from [facility] requesting forms for ankle orthoses (Dx code used M15.9, osteoarthritis of multiple joints, reason for orthosis chronic arthritis) and for DM test strips (pt not treated with insulin, Dx code E11.9, testing frequency 1x per day). Faxed to [phone].   Emmaline Kluver, MD PGY-3, [facility] 11/20/2014, 3:54 PM

## 2014-11-26 DIAGNOSIS — M5136 Other intervertebral disc degeneration, lumbar region: Secondary | ICD-10-CM | POA: Diagnosis not present

## 2014-11-26 DIAGNOSIS — M47817 Spondylosis without myelopathy or radiculopathy, lumbosacral region: Secondary | ICD-10-CM | POA: Diagnosis not present

## 2014-11-27 DIAGNOSIS — E119 Type 2 diabetes mellitus without complications: Secondary | ICD-10-CM | POA: Diagnosis not present

## 2014-12-01 ENCOUNTER — Ambulatory Visit (INDEPENDENT_AMBULATORY_CARE_PROVIDER_SITE_OTHER): Payer: Medicare Other | Admitting: Family Medicine

## 2014-12-01 ENCOUNTER — Encounter: Payer: Self-pay | Admitting: Family Medicine

## 2014-12-01 VITALS — BP 133/61 | HR 77 | Temp 97.8°F | Ht 62.0 in | Wt 124.4 lb

## 2014-12-01 DIAGNOSIS — M25512 Pain in left shoulder: Secondary | ICD-10-CM

## 2014-12-01 NOTE — Assessment & Plan Note (Addendum)
Left shoulder pain with tenderness AC joint and bicipital groove that suggests OA, also with impingement on exam suggesting partial (some strength preserved) rotator cuff tear. Sx's 6 months, worse 2 months, not an acute tear therefore. Unable to MRI given pacemaker in place. - Refer to [facility] for ultrasound eval. - Left shoulder xray ordered to eval for OA. - Pain: Prefer steroid injection today but just had one 5d ago on back. Tylenol TID instead x 1-2 weeks. De Nurse / aspercreme for pain prn. - Avoid overhead lifting. - Can order PT pending xray / [place] results - 4 wk f/u

## 2014-12-01 NOTE — Progress Notes (Signed)
Patient ID: Mercedes Barr, female   DOB: [birth year], 69 y.o.   MRN: [id number] Subjective:   CC: L shoulder pain  HPI:   Patient presents with ~6 months of left shoulder pain that has gotten worse since February. She does not report any trauma but states when lifting things, her arm hurts severely. Even passively, cannot lift above head. Started out achy pain posterior shoulder radiating to posterior mid-arm. When moves, thinks she hears a popping. Denies neck pain. Denies numbness. Mild tingling around [facility] joint. Worse if lays on right arm. No sudden weakness but feels like she cannot lift things now. No prior injury, surgery, fevers, or chills in shoulder. Tried bengay which helps.  Review of Systems - Per HPI.  PMH - DM type 2, HLD, depression, HTN, COPD, GERD, complete heart block, pacemaker, lumbar radiculopathy, OA right knee, OA multiple sites 09/24/14 total knee revision    Objective:  Physical Exam Temp(Src) 97.8 F (36.6 C) (Oral)  Ht 5\' 2"  (1.575 m)  Wt 124 lb 6 oz (56.416 kg)  BMI 22.74 kg/m2 GEN: NAD EXTR: Left shoulder no deformity or swelling, tender along AC joint, bicipital groove, and along posterior shoulder Mild c spine tenderness around c7 Negative spurlings bilaterally Decreased active left shoulder abduction and forward flexion to about 90 deg; passively to about 120 deg limited by severe pain Pos impingement symptoms on empty can, resisted empty can, and hawkins tests  Assessment:     Mercedes Barr is a 69 y.o. female here for evaluation of left shoulder pain.    Plan:     # See problem list and after visit summary for problem-specific plans.   # Health Maintenance: Not discussed  Follow-up: Follow up in 4 weeks for f/u of left shoulder pain.    Hilton Sinclair, MD [place]

## 2014-12-01 NOTE — Patient Instructions (Signed)
Good to see you today.  We are getting an xray. I think this is a partial tear of your rotator cuff but because we cannot do an MRI, I want you to see sports medicine for a dedicated shoulder ultrasound. We will hold off on injection today since you just had one in your back, but follow up in 1 month and if still having severe pain, we can do a shoulder injection. Take tylenol 500mg  2-3 times daily as needed for pain for 2 weeks. Use BenGay or aspercreme as needed for pain. Avoid overhead lifting. After I see the xray result, I can consider physical therapy referral.  Best,  Hilton Sinclair, MD  Rotator Cuff Tear The rotator cuff is four tendons that assist in the motion of the shoulder. A rotator cuff tear is a tear in one of these four tendons. It is characterized by pain and weakness of the shoulder. The rotator cuff tendons surround the shoulder ball and socket joint (humeral head). The rotator cuff tendons attach to the shoulder blade (scapula) on one side and the upper arm bone (humerus) on the other side. The rotator cuff is essential for shoulder stability and shoulder motion. SYMPTOMS   Pain around the shoulder, often at the outer portion of the upper arm.  Pain that is worse with shoulder function, especially when reaching overhead or lifting.  Weakness of the shoulder muscles.  Aching when not using your arm; often, pain awakens you at night, especially when sleeping on the affected side.  Tenderness, swelling, warmth, or redness over the outer aspect of the shoulder.  Loss of strength.  Limited motion of the shoulder, especially reaching behind (reaching into one's back pocket) or across your body.  A crackling sound (crepitation) when moving the shoulder.  Biceps tendon pain (in the front of the shoulder) and inflammation, worse with bending the elbow or lifting. CAUSES   Strain from sudden increase in amount or intensity of activity.  Direct blow or injury to  the shoulder.  Aging, wear from from normal use.  Roof of the shoulder (acromial) spur. RISK INCREASES WITH:   Contact sports (football, wrestling, or boxing).  Throwing or hitting sports (baseball, tennis, or volleyball).  Weightlifting and bodybuilding.  Heavy labor.  Previous injury to rotator cuff.  Failure to warm up properly before activity.  Inadequate protective equipment.  Increasing age.  Spurring of the outer end of the scapula (acromion).  Cortisone injections.  Poor shoulder strength and flexibility. PREVENTION  Warm up and stretch properly before activity.  Allow time for rest and recovery between practices and competition.  Maintain physical fitness:  Cardiovascular fitness.  Shoulder flexibility.  Strength and endurance of the rotator cuff muscles and muscles of the shoulder blade.  Learn and use proper technique when throwing or hitting. PROGNOSIS Surgery is often needed. Although, symptoms may go away by themselves. RELATED COMPLICATIONS   Persistent pain that may progress to constant pain.  Shoulder stiffness, frozen shoulder syndrome, or loss of motion.  Recurrence of symptoms, especially if treated without surgery.  Inability to return to same level of sports, even with surgery.  Persistent weakness.  Risks of surgery, including infection, bleeding, injury to nerves, shoulder stiffness, weakness, re-tearing of the rotator cuff tendon.  Deltoid detachment, acromial fracture, and persistent pain. TREATMENT Treatment involves the use of ice and medicine to reduce pain and inflammation. Strengthening and stretching exercise are usually recommended. These exercises may be completed at home or with a therapist. You  may also be instructed to modify offending activities. Corticosteroid injections may be given to reduce inflammation. Surgery is usually recommended for athletes. Surgery has the best chance for a full recovery. Surgery  involves:  Removal of an inflamed bursa.  Removal of an acromial spur if present.  Suturing the torn tendon back together. Rotator cuff surgeries may be preformed either arthroscopically or through an open incision. Recovery typically takes 6 to 12 months. MEDICATION  If pain medicine is necessary, then nonsteroidal anti-inflammatory medicines, such as aspirin and ibuprofen, or other minor pain relievers, such as acetaminophen, are often recommended.  Do not take pain medicine for 7 days before surgery.  Prescription pain relievers are usually only prescribed after surgery. Use only as directed and only as much as you need.  Corticosteroid injections may be given to reduce inflammation. However, there is a limited number of times the joint may be injected with these medicines. HEAT AND COLD  Cold treatment (icing) relieves pain and reduces inflammation. Cold treatment should be applied for 10 to 15 minutes every 2 to 3 hours for inflammation and pain and immediately after any activity that aggravates your symptoms. Use ice packs or massage the area with a piece of ice (ice massage).  Heat treatment may be used prior to performing the stretching and strengthening activities prescribed by your caregiver, physical therapist, or athletic trainer. Use a heat pack or soak the injury in warm water. SEEK MEDICAL CARE IF:   Symptoms get worse or do not improve in 4 to 6 weeks despite treatment.  You experience pain, numbness, or coldness in the hand.  Blue, gray, or dark color appears in the fingernails.  New, unexplained symptoms develop (drugs used in treatment may produce side effects). Document Released: 07/25/2005 Document Revised: 10/17/2011 Document Reviewed: 11/06/2008 [facility] Patient Information 2015 [place], [place]. This information is not intended to replace advice given to you by your health care provider. Make sure you discuss any questions you have with your health care  provider.

## 2014-12-16 ENCOUNTER — Ambulatory Visit
Admission: RE | Admit: 2014-12-16 | Discharge: 2014-12-16 | Disposition: A | Discharge status: Home / Self Care | Payer: Medicare Other | Source: Ambulatory Visit | Attending: Family Medicine | Admitting: Family Medicine

## 2014-12-16 DIAGNOSIS — M25512 Pain in left shoulder: Secondary | ICD-10-CM

## 2014-12-17 ENCOUNTER — Telehealth: Payer: Self-pay | Admitting: Family Medicine

## 2014-12-17 NOTE — Telephone Encounter (Signed)
Left message on patient's personal voicemail that shoulder xray showed mild glenohumeral degenerative changes with some probable tendinitis (seen due to calcific changes). Asked her to call and notify us if she had not been called yet with a sports medicine appointment.  Hilton Sinclair, MD

## 2014-12-18 DIAGNOSIS — S83429D Sprain of lateral collateral ligament of unspecified knee, subsequent encounter: Secondary | ICD-10-CM | POA: Diagnosis not present

## 2014-12-18 DIAGNOSIS — Z96651 Presence of right artificial knee joint: Secondary | ICD-10-CM | POA: Diagnosis not present

## 2014-12-18 DIAGNOSIS — Z471 Aftercare following joint replacement surgery: Secondary | ICD-10-CM | POA: Diagnosis not present

## 2014-12-31 ENCOUNTER — Ambulatory Visit (INDEPENDENT_AMBULATORY_CARE_PROVIDER_SITE_OTHER): Payer: Medicare Other | Admitting: Family Medicine

## 2014-12-31 ENCOUNTER — Encounter: Payer: Self-pay | Admitting: Family Medicine

## 2014-12-31 VITALS — BP 142/85 | HR 91 | Temp 97.9°F | Wt 128.0 lb

## 2014-12-31 DIAGNOSIS — M545 Low back pain: Secondary | ICD-10-CM | POA: Diagnosis not present

## 2014-12-31 DIAGNOSIS — M25512 Pain in left shoulder: Secondary | ICD-10-CM

## 2014-12-31 DIAGNOSIS — M79604 Pain in right leg: Secondary | ICD-10-CM

## 2014-12-31 MED ORDER — METHYLPREDNISOLONE ACETATE 40 MG/ML IJ SUSP
40.0000 mg | Freq: Once | INTRAMUSCULAR | Status: AC
  Administered 2014-12-31: 40 mg via INTRA_ARTICULAR

## 2014-12-31 NOTE — Progress Notes (Signed)
Patient ID: Mercedes Barr, female   DOB: [birth year], 69 y.o.   MRN: [id number] Subjective:   CC: f/u shoulder pain  HPI:   Left shoulder pain Patient states that sholder pain, present 6 months in left shoulder, got no better since last visit. Xray showed some glenohumeral arthritis and mild tendinitis. She thinks it has actually gotten worse. She takes tylenol "arthritis medication" with no improvement, along with BenGay rubs. She denies fevers/chills or other concerns. ROM is still decreased. She was unable to get steroid injection last visit because she had just gotten injection in her back (with good results).   Low back pain Low back pain with steroid shot that improved symptoms. She keeps getting requests for back brace that she never asked for (as we have been also getting for her) and wants to know what to do with them.  Review of Systems - Per HPI.   PMH: Reviewed SH: Never smoker     Objective:  Physical Exam BP 142/85 mmHg  Pulse 91  Temp(Src) 97.9 F (36.6 C) (Oral)  Wt 128 lb (58.06 kg) GEN: NAD Extremities: Left shoulder with no deformity, erythema, or effusion Active Range of motion to forward flexion 140 and abduction 90 before severe pain Passive range of motion to forward flexion 160 before severe pain and abduction 120 before end point Tender to palpation supraspinatus, coracoid process, and AC joint Impingement testing including empty can sign positive Skin: Well-healed midline linear chest scar    Procedure:  Injection of left subacromial space Consent obtained and verified. Time-out conducted. Noted no overlying erythema, induration, or other signs of local infection. Skin prepped in a sterile fashion. Topical analgesic spray: Ethyl chloride. Completed without difficulty. Meds: 1:4 mL (depomedrol 40mg :lidocaine 1% without epi) Pain immediately improved suggesting accurate placement of the medication. Advised to call if fevers/chills, erythema,  induration, drainage, or persistent bleeding.  Assessment:     Mercedes Barr is a 69 y.o. female here for left shoulder pain.    Plan:     # See problem list and after visit summary for problem-specific plans.   # Health Maintenance: Not discussed  Follow-up: Follow up with [facility] for [place] eval.   Mercedes Sinclair, MD [place]

## 2014-12-31 NOTE — Patient Instructions (Signed)
It was good to see you today. You had a left shoulder joint injection with steroid. This could cause a little bump in your blood sugars. If you have a very high bump, please let us know right away. Continue using Aspercreme or BenGay daily. Also do shoulder range of motion exercises that we discussed. Please call back sports medicine to set up an appointment. Good to see you. Return in July for follow-up of diabetes.  Hilton Sinclair, MD

## 2015-01-03 NOTE — Assessment & Plan Note (Signed)
Recent back injection which helped (~mid-April).  -Urged pt to stay active and not use back brace that company was trying to advertise to her. She voiced understanding.

## 2015-01-03 NOTE — Assessment & Plan Note (Addendum)
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 Exam with signs of impingement along with tenderness along supraspinatus and AC joint and coracoid process. Decreased ROM suggests mild adhesive capsulitis along with degenerative rotator cuff tendonopathy.  -L subacromial injection today (see procedure note) though only 1 month out from back injection, due to level of pain. -Urged pt to call back SM for appt as [place] would be very helpful, especially since pacemaker rules out MRI. -Well-controlled DM, but Monitor glucose and notify clinic if major increase. -Continue aspercreme/BenGay daily massages. -Reviewed shoulder ROM exercises.

## 2015-01-08 ENCOUNTER — Other Ambulatory Visit: Payer: Self-pay | Admitting: Family Medicine

## 2015-01-08 NOTE — Telephone Encounter (Signed)
Pt called because her insurance company called her and need the doctor to call in all her prescriptions at 90 day qty since it will cost the same as 30 qty. jw

## 2015-01-09 NOTE — Telephone Encounter (Signed)
Please notify patient I have refilled medications with 90 day supplies as requested but she is due for follow up just to focus on diabetes, high blood pressure, and cholesterol. Hilton Sinclair, MD

## 2015-01-09 NOTE — Telephone Encounter (Signed)
Letter mailed to patient. Jazmin Hartsell,CMA  

## 2015-01-13 DIAGNOSIS — Z471 Aftercare following joint replacement surgery: Secondary | ICD-10-CM | POA: Diagnosis not present

## 2015-01-13 DIAGNOSIS — Z96651 Presence of right artificial knee joint: Secondary | ICD-10-CM | POA: Diagnosis not present

## 2015-03-04 DIAGNOSIS — E119 Type 2 diabetes mellitus without complications: Secondary | ICD-10-CM | POA: Diagnosis not present

## 2015-03-06 ENCOUNTER — Other Ambulatory Visit: Payer: Self-pay | Admitting: Family Medicine

## 2015-03-06 MED ORDER — METFORMIN HCL 500 MG PO TABS: 500.0000 mg | ORAL_TABLET | Freq: Two times a day (BID) | ORAL | Status: DC

## 2015-03-06 NOTE — Telephone Encounter (Signed)
Pt says her insurance is faxing [place] a refill request for Metformin, it is normally sent by mail but pt is out and needs it sent to [address]. Pt still wants it sent to the mail order pharmacy for her refills.

## 2015-03-16 DIAGNOSIS — E119 Type 2 diabetes mellitus without complications: Secondary | ICD-10-CM | POA: Diagnosis not present

## 2015-03-16 DIAGNOSIS — H2513 Age-related nuclear cataract, bilateral: Secondary | ICD-10-CM | POA: Diagnosis not present

## 2015-03-16 LAB — HM DIABETES EYE EXAM

## 2015-03-17 DIAGNOSIS — Z96651 Presence of right artificial knee joint: Secondary | ICD-10-CM | POA: Diagnosis not present

## 2015-03-17 DIAGNOSIS — Z471 Aftercare following joint replacement surgery: Secondary | ICD-10-CM | POA: Diagnosis not present

## 2015-03-30 ENCOUNTER — Telehealth: Payer: Self-pay | Admitting: Cardiovascular Disease

## 2015-03-30 NOTE — Telephone Encounter (Signed)
Dr. Maureen Ralphs prescribed her Gabapentin 300 mg.  She started having chest discomfort and short of breath  Stopped on 8/18 and pain and shortness of breath is resolving .  Dr.  Roxine Caddy felt she should let her cardiologist know  Instructed patient that if pain does not resolve, gets worse then she needs to seek medical care immediately . Also noted that she needs her 6 month follow up appointment.  Routed to scheduling to make an appointment

## 2015-03-30 NOTE — Telephone Encounter (Signed)
These are indeed not common side effects with gabapentin. Please mover her appointment up to first available. Call us right away if the above symptoms return.

## 2015-03-30 NOTE — Telephone Encounter (Signed)
Pt called in stating that Dr. Maureen Ralphs prescribed Gabapentin 300mg  for her and since then she had been having some chest pain and SOB. She stated that she stopped taking the medication on 8/18 and since then the chest pain and SOB has not been as severe. Please f/u with her if need to . Please call  Thanks

## 2015-03-31 NOTE — Telephone Encounter (Signed)
Can this encounter be closed? Pt is scheduled for a 6 month f/u in Nov

## 2015-04-03 NOTE — Telephone Encounter (Signed)
Info forwarded to [facility] for scheduling appt next available with Dr. Loletha Grayer.

## 2015-04-07 ENCOUNTER — Encounter: Payer: Medicare Other | Admitting: Cardiovascular Disease

## 2015-04-08 ENCOUNTER — Ambulatory Visit (INDEPENDENT_AMBULATORY_CARE_PROVIDER_SITE_OTHER): Payer: Medicare Other | Admitting: Family Medicine

## 2015-04-08 ENCOUNTER — Telehealth: Payer: Self-pay | Admitting: Family Medicine

## 2015-04-08 VITALS — BP 118/80 | HR 105 | Temp 98.3°F | Wt 134.6 lb

## 2015-04-08 DIAGNOSIS — M25551 Pain in right hip: Secondary | ICD-10-CM | POA: Insufficient documentation

## 2015-04-08 MED ORDER — PREDNISONE 50 MG PO TABS: ORAL_TABLET | ORAL | Status: DC

## 2015-04-08 NOTE — Assessment & Plan Note (Signed)
Pain could be secondary to degenerative changes in the hip as observed on films in 2008 Also could potentially be from her lower back if she has a history of radiculopathy as well. 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  She has a history of right knee replacement  She is on chronic narcotics by her orthopedist for knee pain - Trial of prednisone 50 mg for 5 days - If no improvement consider imaging of her left hip - Could consider to refer back to [facility] ortho repeat epidural injection

## 2015-04-08 NOTE — Telephone Encounter (Signed)
Clinic portion completed and placed in providers box. Jazmin Hartsell,CMA  

## 2015-04-08 NOTE — Progress Notes (Signed)
   Subjective:    Patient ID: Mercedes Barr, female    DOB: 17-Mar-1946, 69 y.o.   MRN: [id number]  Seen for Same day visit for   CC: right hip pain   Location: pointing to her lower back and right groin  Pain started: last Saturday  Pain is: sharp, achy pain, worse with walking  Severity: 7/10 Medications tried: taking norco for knee pain. Prescribed by Dr. Doren Custard ortho Recent trauma: no fall, knees give out from time to time. Right knee replacement in Feb.  Similar pain previously: yes, she has an epidural injection in May. Reports that it usually lasts for one year.  Hip x-ray 2008 showing degenerative changes. Ct lumbar spine 2014:  spondylolysis advanced b/l facet degenerative changes. Cervical canal narrowing.   Symptoms Redness:no Swelling:no Fever: no Weakness: no Weight loss: no Rash: no   Review of Systems   See HPI for ROS. Objective:  BP 118/80 mmHg  Pulse 105  Temp(Src) 98.3 F (36.8 C) (Oral)  Wt 134 lb 9.6 oz (61.054 kg)  General: NAD MSK: ambulating with cane, normal knee flexion and extension 5 out of 5 strength in lower extremity Neurovascularly intact Normal plantar and dorsal flexion HIP: no obvious deformity Tender to palpation of the gluten medius attachment on the right hip 4 out of 5 strength with hip flexion compared to the left Pain reproducible with internal and external hip rotation Limited range of motion of internal and external hip rotation     Assessment & Plan:   Right hip pain Pain could be secondary to degenerative changes in the hip as observed on films in 2008 Also could potentially be from her lower back if she has a history of radiculopathy as well. 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  She has a history of right knee replacement  She is on chronic narcotics by her orthopedist for knee pain - Trial of prednisone 50 mg for 5 days - If no improvement consider imaging of her left hip - Could  consider to refer back to [facility] ortho repeat epidural injection

## 2015-04-08 NOTE — Patient Instructions (Signed)
Thank you for coming in,   I have sent in prednisone which is a strong anti-inflammatory.   Please follow up with Dr. Berkley Harvey for your completion of your paperwork.    Please feel free to call with any questions or concerns at any time, at [phone]. --Dr. Raeford Razor

## 2015-04-08 NOTE — Telephone Encounter (Signed)
Pt left transportation forms to be completed

## 2015-04-09 NOTE — Telephone Encounter (Signed)
Pt is scheduled for 04-27-15. Jazmin Hartsell,CMA

## 2015-04-09 NOTE — Telephone Encounter (Signed)
Please call her and have her make an appointment to be seen by me and have the paperwork completed. I have never seen her before and I'm unable to complete the forms without evaluating her.

## 2015-04-11 ENCOUNTER — Other Ambulatory Visit: Payer: Self-pay | Admitting: Family Medicine

## 2015-04-16 ENCOUNTER — Telehealth: Payer: Self-pay | Admitting: Family Medicine

## 2015-04-16 NOTE — Telephone Encounter (Signed)
Please call- I'm unable to refill "all her Rx" because her medication list in EPIC is not up to date (has many duplicate meds with different doses) and I have never seen her (so I don't know which doses / medications she is supposed to be taking). If she can let us know which medications she is currently taking and they doses, then I can review the list and refill medications as indicated / still required. She should bring all her medications to her appointment with me.

## 2015-04-16 NOTE — Telephone Encounter (Signed)
Will forward to PCP for review to see if he received refill requests for medications. Flannery Cavallero, CMA.

## 2015-04-16 NOTE — Telephone Encounter (Signed)
Pt was told by [facility] that all her RX needed to be refilled except buspirone and metformin

## 2015-04-17 NOTE — Telephone Encounter (Signed)
LMOVM for callback. . Mercedes Barr Dawn  

## 2015-04-27 ENCOUNTER — Other Ambulatory Visit: Payer: Self-pay | Admitting: Student-PharmD

## 2015-04-27 ENCOUNTER — Ambulatory Visit (INDEPENDENT_AMBULATORY_CARE_PROVIDER_SITE_OTHER): Payer: Medicare Other | Admitting: Family Medicine

## 2015-04-27 ENCOUNTER — Encounter: Payer: Self-pay | Admitting: Family Medicine

## 2015-04-27 VITALS — Ht 62.0 in | Wt 133.0 lb

## 2015-04-27 DIAGNOSIS — D649 Anemia, unspecified: Secondary | ICD-10-CM | POA: Diagnosis not present

## 2015-04-27 DIAGNOSIS — E039 Hypothyroidism, unspecified: Secondary | ICD-10-CM | POA: Diagnosis not present

## 2015-04-27 DIAGNOSIS — Z780 Asymptomatic menopausal state: Secondary | ICD-10-CM

## 2015-04-27 DIAGNOSIS — J42 Unspecified chronic bronchitis: Secondary | ICD-10-CM

## 2015-04-27 DIAGNOSIS — E119 Type 2 diabetes mellitus without complications: Secondary | ICD-10-CM | POA: Diagnosis not present

## 2015-04-27 DIAGNOSIS — Z23 Encounter for immunization: Secondary | ICD-10-CM | POA: Diagnosis not present

## 2015-04-27 DIAGNOSIS — R262 Difficulty in walking, not elsewhere classified: Secondary | ICD-10-CM | POA: Insufficient documentation

## 2015-04-27 LAB — IRON AND TIBC
%SAT: 35 % (ref 11–50)
IRON: 117 ug/dL (ref 45–160)
TIBC: 330 ug/dL (ref 250–450)
UIBC: 213 ug/dL (ref 125–400)

## 2015-04-27 LAB — CBC
HCT: 43 % (ref 36.0–46.0)
HEMOGLOBIN: 14.1 g/dL (ref 12.0–15.0)
MCH: 27.9 pg (ref 26.0–34.0)
MCHC: 32.8 g/dL (ref 30.0–36.0)
MCV: 85.1 fL (ref 78.0–100.0)
MPV: 9.8 fL (ref 8.6–12.4)
Platelets: 226 10*3/uL (ref 150–400)
RBC: 5.05 MIL/uL (ref 3.87–5.11)
RDW: 13.5 % (ref 11.5–15.5)
WBC: 4.6 10*3/uL (ref 4.0–10.5)

## 2015-04-27 LAB — POCT GLYCOSYLATED HEMOGLOBIN (HGB A1C): Hemoglobin A1C: 8

## 2015-04-27 MED ORDER — METFORMIN HCL 500 MG PO TABS: 1000.0000 mg | ORAL_TABLET | Freq: Two times a day (BID) | ORAL | Status: DC

## 2015-04-27 MED ORDER — GLIPIZIDE ER 10 MG PO TB24: ORAL_TABLET | ORAL | Status: DC

## 2015-04-27 MED ORDER — LEVOTHYROXINE SODIUM 75 MCG PO TABS: ORAL_TABLET | ORAL | Status: DC

## 2015-04-27 MED ORDER — SITAGLIPTIN PHOSPHATE 100 MG PO TABS
ORAL_TABLET | ORAL | Status: DC
Start: 2015-04-27 — End: 2016-04-06

## 2015-04-27 MED ORDER — CITALOPRAM HYDROBROMIDE 20 MG PO TABS: 20.0000 mg | ORAL_TABLET | Freq: Every morning | ORAL | Status: DC

## 2015-04-27 NOTE — Progress Notes (Signed)
  Patient name: Mercedes Barr  Date of birth: 1946/06/24  CC & HPI:  Mercedes Barr is a 69 y.o. female presenting today for difficulty walking.  She reports difficulty walking one block due to multiple medical problems.  She has lower extremity pain due to multi-joint osteoarthritis with right knee replacement 2; also with shortness of breath due to COPD and anemia as well as heart block status post pacemaker placement.  She reports being unable to use fixed Route public transportation due to inability to walk the necessary distance to get to and from bus stops.  She also has difficulty standing 15 minutes.  Her daughter assists her with going around the community.  This is difficult as she is not always available.  She is unable to take taxis due to financial difficulties.  Normally walks with cane, but often has to use walker when walking longer than 15 minutes   Diabetes - Has been exercising less due to joint pains - Only taking metformin 500 mg twice a day; denies GI issues with this - Has been out of glipizide for some time  ROS: See HPI   Medical & Surgical Hx:  Reviewed  Medications & Allergies: Reviewed  Social History: Reviewed:   Objective Findings:  Vitals: Ht 5\' 2"  (1.575 m)  Wt 133 lb (60.328 kg)  BMI 24.32 kg/m2  Gen: NAD; walks with cane CV: RRR w/o m/r/g, pulses +2 b/l Resp: Decreased breath sounds bilaterally w/ normal respiratory effort  Assessment & Plan:   Difficulty walking Difficulty walking due to multiple medical problems.  Multiple joint osteoarthritis, status post right knee replacement 2,  COPD, anemia - Completed form for scat transportation - Check TSH and CBC / iron studies - Advised patient to use steroids inhaler daily; was only using as needed - Provided patient with spacer at Next visit  Hypothyroidism Check TSH - Refill Synthroid  COPD (chronic obstructive pulmonary disease) Advise patient to use steroids inhaler daily; was  only using when necessary - Need to provide patient with spacer at Next visit  DM (diabetes mellitus), type 2 Cleaned up medication list.  - Increase metformin 1000 mg twice a day - Refilled glipizide - Asked patient to check blood sugars every morning, and bring this log to our next meeting  Anemia Denies lightheadedness, dizziness.  Endorse chronic shortness of breath.  Difficult to tell if this is related to anemia versus long-standing COPD with medication noncompliance - Check CBC, iron panel - Patient reports colonoscopy within the last 5 years with polyp removed Will call with results of labs to discuss next management

## 2015-04-27 NOTE — Assessment & Plan Note (Addendum)
Denies lightheadedness, dizziness.  Endorse chronic shortness of breath.  Difficult to tell if this is related to anemia versus long-standing COPD with medication noncompliance - Check CBC, iron panel - Patient reports colonoscopy within the last 5 years with polyp removed Will call with results of labs to discuss next management

## 2015-04-27 NOTE — Assessment & Plan Note (Signed)
Advise patient to use steroids inhaler daily; was only using when necessary - Need to provide patient with spacer at Next visit

## 2015-04-27 NOTE — Assessment & Plan Note (Signed)
Cleaned up medication list.  - Increase metformin 1000 mg twice a day - Refilled glipizide - Asked patient to check blood sugars every morning, and bring this log to our next meeting

## 2015-04-27 NOTE — Patient Instructions (Signed)
It was great seeing you today.   I have order some labs today. I will send you a letter with the results, or call you if we need to make any changes to your current therapies.  Please let me know if you need any additional information for your [employer] transportation   Please bring all your medications to every doctors visit  Sign up for My Chart to have easy access to your labs results, and communication with your Primary care physician.  Next Appointment  Please call to make an appointment with Dr Berkley Harvey in 1-2 for diabetes, thyroid and anemia   I look forward to talking with you again at our next visit. If you have any questions or concerns before then, please call the clinic at [phone].  Take Care,   Dr Phill Myron

## 2015-04-27 NOTE — Assessment & Plan Note (Signed)
Check TSH.  Refill Synthroid. 

## 2015-04-27 NOTE — Assessment & Plan Note (Signed)
Difficulty walking due to multiple medical problems.  Multiple joint osteoarthritis, status post right knee replacement 2,  COPD, anemia - Completed form for scat transportation - Check TSH and CBC / iron studies - Advised patient to use steroids inhaler daily; was only using as needed - Provided patient with spacer at Next visit

## 2015-04-28 LAB — TSH: TSH: 0.696 u[IU]/mL (ref 0.350–4.500)

## 2015-04-28 LAB — FERRITIN: FERRITIN: 124 ng/mL (ref 10–291)

## 2015-04-28 LAB — VITAMIN D 25 HYDROXY (VIT D DEFICIENCY, FRACTURES): Vit D, 25-Hydroxy: 28 ng/mL — ABNORMAL LOW (ref 30–100)

## 2015-05-06 ENCOUNTER — Telehealth: Payer: Self-pay | Admitting: Family Medicine

## 2015-05-06 NOTE — Telephone Encounter (Signed)
Will forward to PCP for review to see if he has received lab results yet. Adams,Latoya, CMA.

## 2015-05-06 NOTE — Telephone Encounter (Signed)
Pt called and would like to have her lab results. jw

## 2015-05-07 NOTE — Telephone Encounter (Signed)
Called and discussed lab results with patient.  Advised her that her vitamin D level was borderline low.  She will start taking a multivitamin daily.  Advised her to make an appointment in the next few months to discuss her chronic medical problems.

## 2015-05-12 ENCOUNTER — Encounter: Payer: Self-pay | Admitting: *Deleted

## 2015-05-12 ENCOUNTER — Other Ambulatory Visit: Payer: Self-pay | Admitting: Family Medicine

## 2015-05-12 NOTE — Telephone Encounter (Signed)
Needs RX sent to [facility] [phone] Need metformin, genric for lasix and several more The person at [facility] said there were 9 RX waiting to be filled but didn't say which ones.  Pt has no idea which ones

## 2015-05-15 NOTE — Telephone Encounter (Signed)
Refill request from pt. Will forward to PCP for review. Honestee Revard, CMA. 

## 2015-05-21 ENCOUNTER — Ambulatory Visit (INDEPENDENT_AMBULATORY_CARE_PROVIDER_SITE_OTHER): Payer: Medicare Other | Admitting: Family Medicine

## 2015-05-21 ENCOUNTER — Encounter: Payer: Self-pay | Admitting: Family Medicine

## 2015-05-21 VITALS — BP 123/62 | HR 82 | Temp 98.5°F | Ht 62.0 in | Wt 130.0 lb

## 2015-05-21 DIAGNOSIS — K219 Gastro-esophageal reflux disease without esophagitis: Secondary | ICD-10-CM | POA: Diagnosis not present

## 2015-05-21 DIAGNOSIS — E119 Type 2 diabetes mellitus without complications: Secondary | ICD-10-CM

## 2015-05-21 DIAGNOSIS — Z23 Encounter for immunization: Secondary | ICD-10-CM

## 2015-05-21 DIAGNOSIS — Z91030 Bee allergy status: Secondary | ICD-10-CM

## 2015-05-21 DIAGNOSIS — Z91038 Other insect allergy status: Secondary | ICD-10-CM

## 2015-05-21 DIAGNOSIS — I5030 Unspecified diastolic (congestive) heart failure: Secondary | ICD-10-CM

## 2015-05-21 DIAGNOSIS — M25512 Pain in left shoulder: Secondary | ICD-10-CM

## 2015-05-21 DIAGNOSIS — J42 Unspecified chronic bronchitis: Secondary | ICD-10-CM

## 2015-05-21 MED ORDER — OMEPRAZOLE 20 MG PO CPDR: DELAYED_RELEASE_CAPSULE | ORAL | Status: DC

## 2015-05-21 MED ORDER — FUROSEMIDE 20 MG PO TABS: ORAL_TABLET | ORAL | Status: DC

## 2015-05-21 MED ORDER — EPINEPHRINE 0.3 MG/0.3ML IJ SOAJ: 0.3000 mg | INTRAMUSCULAR | Status: DC | PRN

## 2015-05-21 MED ORDER — ALBUTEROL SULFATE HFA 108 (90 BASE) MCG/ACT IN AERS: INHALATION_SPRAY | RESPIRATORY_TRACT | Status: DC

## 2015-05-21 NOTE — Patient Instructions (Signed)
It was great seeing you today.   1. Continue checking your blood sugars every morning.  You can also check your blood sugars 2 hours after eating to see how food affects it. 2. I have referred you to physical therapy for your left shoulder pain which is caused by adhesive capsulitis.  You have a follow-up appointment to receive a steroid shot if the pain persist.  3. Bring all your medications and EpiPen to your next appointment   Please bring all your medications to every doctors visit  Sign up for My Chart to have easy access to your labs results, and communication with your Primary care physician.  Next Appointment  Please call to make an appointment with Dr Berkley Harvey after December 20th for your diabetes   I look forward to talking with you again at our next visit. If you have any questions or concerns before then, please call the clinic at [phone].  Take Care,   Dr Phill Myron

## 2015-05-22 NOTE — Assessment & Plan Note (Signed)
Exam consistent with frozen shoulder - referred to PT and instructed on home stretches  - Schedule for steroid injection on 11/4 if pain and ROM not improving with PT

## 2015-05-22 NOTE — Assessment & Plan Note (Signed)
Fair control. Last A1c 8 - Metformin increase at that visit - Continue to check BGs qam and 2 hours postprandial prn - F/u in 3 months for recheck of A1c

## 2015-05-22 NOTE — Progress Notes (Signed)
  Patient name: Mercedes Barr MRN [id number]  Date of birth: Sep 13, 1945  CC & HPI:  Mercedes Barr is a 69 y.o. female presenting today for DM, COPD and left shoulder pain.   DIABETES   Blood Sugar Ranges: 80-140  Symptoms of Hypoglycemia? no  Comorbid Symptoms: no Chest pain; no SOB; no Neuropathy: Vision problems - glaucoma & Cataracts  Medication Compliance: yes   Medication Side Effects: denies GI issues  Counseling  Exercise: limited by MSK pain   COPD - Denies SOB, CP, Cough  - Continues to use albuterol with benefit   Left shoulder pain - pain , decreased ROM and stiffness x ~ 1 year - No initiating injury - no arm numbness / weakness  ROS: See HPI   Medical & Surgical Hx:  Reviewed  Medications & Allergies: Reviewed  Social History: Reviewed:   Objective Findings:  Vitals: BP 123/62 mmHg  Pulse 82  Temp(Src) 98.5 F (36.9 C) (Oral)  Ht 5\' 2"  (1.575 m)  Wt 130 lb (58.968 kg)  BMI 23.77 kg/m2  Gen: NAD CV: RRR w/o m/r/g, pulses +2 b/l Resp: Decreased breath sounds bilaterally w/ normal respiratory effort Left Shoulder: Inspection: No abnormalities, atrophy or asymmetry; No erythema or bruising Palpation: Normal with no tenderness over AC joint or bicipital groove. ROM: ABduction limied to 85 degrees; External rotation limited to 30 degrees Rotator cuff strength normal throughout; No signs of impingement with negative Neer and Hawkin's tests, empty can sign.  Assessment & Plan:   (HFpEF) heart failure with preserved ejection fraction (HCC) Well controlled. Follows with cardiology yearly - Refilled lasix   COPD (chronic obstructive pulmonary disease) Well controlled  - continues albuterol prn  DM (diabetes mellitus), type 2 Fair control. Last A1c 8 - Metformin increase at that visit - Continue to check BGs qam and 2 hours postprandial prn - F/u in 3 months for recheck of A1c  Left shoulder pain Exam consistent with frozen shoulder - referred  to PT and instructed on home stretches  - Schedule for steroid injection on 11/4 if pain and ROM not improving with PT

## 2015-05-22 NOTE — Assessment & Plan Note (Signed)
Well controlled. Follows with cardiology yearly - Refilled lasix

## 2015-05-22 NOTE — Assessment & Plan Note (Signed)
Well controlled  - continues albuterol prn

## 2015-05-30 ENCOUNTER — Other Ambulatory Visit: Payer: Self-pay | Admitting: Family Medicine

## 2015-06-03 DIAGNOSIS — E119 Type 2 diabetes mellitus without complications: Secondary | ICD-10-CM | POA: Diagnosis not present

## 2015-06-07 ENCOUNTER — Encounter (HOSPITAL_COMMUNITY): Payer: Self-pay | Admitting: *Deleted

## 2015-06-07 ENCOUNTER — Emergency Department (HOSPITAL_COMMUNITY)
Admission: EM | Admit: 2015-06-07 | Discharge: 2015-06-07 | Disposition: A | Discharge status: Home / Self Care | Payer: Medicare Other | Attending: Emergency Medicine | Admitting: Emergency Medicine

## 2015-06-07 ENCOUNTER — Emergency Department (HOSPITAL_COMMUNITY): Payer: Medicare Other

## 2015-06-07 DIAGNOSIS — E039 Hypothyroidism, unspecified: Secondary | ICD-10-CM | POA: Insufficient documentation

## 2015-06-07 DIAGNOSIS — Z7951 Long term (current) use of inhaled steroids: Secondary | ICD-10-CM | POA: Insufficient documentation

## 2015-06-07 DIAGNOSIS — Z7984 Long term (current) use of oral hypoglycemic drugs: Secondary | ICD-10-CM | POA: Insufficient documentation

## 2015-06-07 DIAGNOSIS — Z950 Presence of cardiac pacemaker: Secondary | ICD-10-CM | POA: Diagnosis not present

## 2015-06-07 DIAGNOSIS — J449 Chronic obstructive pulmonary disease, unspecified: Secondary | ICD-10-CM | POA: Diagnosis not present

## 2015-06-07 DIAGNOSIS — Z7982 Long term (current) use of aspirin: Secondary | ICD-10-CM | POA: Diagnosis not present

## 2015-06-07 DIAGNOSIS — R05 Cough: Secondary | ICD-10-CM | POA: Insufficient documentation

## 2015-06-07 DIAGNOSIS — I10 Essential (primary) hypertension: Secondary | ICD-10-CM | POA: Insufficient documentation

## 2015-06-07 DIAGNOSIS — R0789 Other chest pain: Secondary | ICD-10-CM | POA: Insufficient documentation

## 2015-06-07 DIAGNOSIS — E119 Type 2 diabetes mellitus without complications: Secondary | ICD-10-CM | POA: Insufficient documentation

## 2015-06-07 DIAGNOSIS — M1990 Unspecified osteoarthritis, unspecified site: Secondary | ICD-10-CM | POA: Insufficient documentation

## 2015-06-07 DIAGNOSIS — I2510 Atherosclerotic heart disease of native coronary artery without angina pectoris: Secondary | ICD-10-CM | POA: Diagnosis not present

## 2015-06-07 DIAGNOSIS — Z9889 Other specified postprocedural states: Secondary | ICD-10-CM | POA: Diagnosis not present

## 2015-06-07 DIAGNOSIS — R011 Cardiac murmur, unspecified: Secondary | ICD-10-CM | POA: Insufficient documentation

## 2015-06-07 DIAGNOSIS — Z79890 Hormone replacement therapy (postmenopausal): Secondary | ICD-10-CM | POA: Insufficient documentation

## 2015-06-07 DIAGNOSIS — K219 Gastro-esophageal reflux disease without esophagitis: Secondary | ICD-10-CM | POA: Insufficient documentation

## 2015-06-07 DIAGNOSIS — Z79899 Other long term (current) drug therapy: Secondary | ICD-10-CM | POA: Insufficient documentation

## 2015-06-07 DIAGNOSIS — R079 Chest pain, unspecified: Secondary | ICD-10-CM | POA: Diagnosis not present

## 2015-06-07 LAB — CBC
HCT: 42.3 % (ref 36.0–46.0)
HEMOGLOBIN: 13.6 g/dL (ref 12.0–15.0)
MCH: 27.8 pg (ref 26.0–34.0)
MCHC: 32.2 g/dL (ref 30.0–36.0)
MCV: 86.5 fL (ref 78.0–100.0)
Platelets: 81 10*3/uL — ABNORMAL LOW (ref 150–400)
RBC: 4.89 MIL/uL (ref 3.87–5.11)
RDW: 12.8 % (ref 11.5–15.5)
WBC: 4.9 10*3/uL (ref 4.0–10.5)

## 2015-06-07 LAB — I-STAT TROPONIN, ED: TROPONIN I, POC: 0 ng/mL (ref 0.00–0.08)

## 2015-06-07 LAB — BASIC METABOLIC PANEL
ANION GAP: 12 (ref 5–15)
BUN: 10 mg/dL (ref 6–20)
CALCIUM: 9.8 mg/dL (ref 8.9–10.3)
CO2: 26 mmol/L (ref 22–32)
Chloride: 97 mmol/L — ABNORMAL LOW (ref 101–111)
Creatinine, Ser: 0.71 mg/dL (ref 0.44–1.00)
GFR calc Af Amer: >60 mL/min (ref >60)
GFR calc non Af Amer: >60 mL/min (ref >60)
GLUCOSE: 196 mg/dL — AB (ref 65–99)
Potassium: 3.5 mmol/L (ref 3.5–5.1)
Sodium: 135 mmol/L (ref 135–145)

## 2015-06-07 NOTE — Discharge Instructions (Signed)
Use heat on the sore area 3-4 times a day. Use Tylenol, or you're hydrocodone if needed for pain. Follow-up with your doctor as scheduled about the pacemaker. Call him sooner, if needed, for problems.   Chest Wall Pain Chest wall pain is pain in or around the bones and muscles of your chest. Sometimes, an injury causes this pain. Sometimes, the cause may not be known. This pain may take several weeks or longer to get better. HOME CARE INSTRUCTIONS  Pay attention to any changes in your symptoms. Take these actions to help with your pain:   Rest as told by your health care provider.   Avoid activities that cause pain. These include any activities that use your chest muscles or your abdominal and side muscles to lift heavy items.   If directed, apply ice to the painful area:  Put ice in a plastic bag.  Place a towel between your skin and the bag.  Leave the ice on for 20 minutes, 2-3 times per day.  Take over-the-counter and prescription medicines only as told by your health care provider.  Do not use tobacco products, including cigarettes, chewing tobacco, and e-cigarettes. If you need help quitting, ask your health care provider.  Keep all follow-up visits as told by your health care provider. This is important. SEEK MEDICAL CARE IF:  You have a fever.  Your chest pain becomes worse.  You have new symptoms. SEEK IMMEDIATE MEDICAL CARE IF:  You have nausea or vomiting.  You feel sweaty or light-headed.  You have a cough with phlegm (sputum) or you cough up blood.  You develop shortness of breath.   This information is not intended to replace advice given to you by your health care provider. Make sure you discuss any questions you have with your health care provider.   Document Released: 07/25/2005 Document Revised: 04/15/2015 Document Reviewed: 10/20/2014 Elsevier Interactive Patient Education [employer].

## 2015-06-07 NOTE — ED Provider Notes (Signed)
CSN: [id number]     Arrival date & time 06/07/15  1218 History   First MD Initiated Contact with Patient 06/07/15 1542     Chief Complaint  Patient presents with  . Chest Pain     (Consider location/radiation/quality/duration/timing/severity/associated sxs/prior Treatment) HPI  Mercedes Barr is a 69 y.o. female who presents for evaluation of chest pain. Pain is located around her pacemaker, and is present constantly for 3 days, and especially bothers her at nighttime. She is taking Tylenol, without relief. She has a cough which is nonproductive. She denies fever, chills, nausea, vomiting, weakness or dizziness. She has a follow-up with her cardiologist in 3 weeks to check on her pacemaker. She has a history of cardiac surgery for "blue baby", in 1988. This was apparently repair of an AV cushion defect. She is chronically pacemaker dependent, for complete heart block. There are no other known modifying factors.   Past Medical History  Diagnosis Date  . Diabetes mellitus   . Hypertension   . Hypothyroidism   . COPD (chronic obstructive pulmonary disease) ([place])   . Heart murmur   . GERD (gastroesophageal reflux disease)   . 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 [place].  OFFICE NOTE DR. A. LITTLE STATES  "EXTREMELY PACEMAKER SENSITIVE AND WHEN YOU TRY TO CHECK FOR UNDERLYING RHYTHMS SHE WILL HAVE SNYCOPE AND WE HAVE NOT DONE THIS NOW IN ABOUT 2 YEARS".  . Normal coronary arteries 2011  . Complete heart block ([place])   . Coronary artery disease   . Shortness of breath dyspnea     walking distance or climbing stairs  . Asthma    Past Surgical History  Procedure Laterality Date  . Pacemaker insertion  1988    last gen 11/08- MDT  . Tubal ligation    . Cardiac catheterization  2011     SHOWED NO CAD-PER CARDIOLOGY OFFICE NOTES DR. A. LITTLE  . Atrioventricular cushion defect repair  1988  . Total knee arthroplasty  Right 11/19/2012    Procedure: RIGHT TOTAL KNEE ARTHROPLASTY;  Surgeon: Gearlean Alf, MD;  Location: [facility];  Service: Orthopedics;  Laterality: Right;  . Insert / replace / remove pacemaker    . Colonscopy       removed polyps   . Total knee revision Right 09/24/2014    Procedure: RIGHT TOTAL KNEE ARTHROPLASTY REVISION;  Surgeon: Gearlean Alf, MD;  Location: [facility];  Service: Orthopedics;  Laterality: Right;   Family History  Problem Relation Age of Onset  . Heart failure Mother   . Heart failure Sister    Social History  Substance Use Topics  . Smoking status: Never Smoker   . Smokeless tobacco: Never Used  . Alcohol Use: No   OB History    No data available     Review of Systems  All other systems reviewed and are negative.     Allergies  Chlorhexidine; Pregabalin; Amitriptyline hcl; Benazepril hcl; Naproxen; Propoxyphene n-acetaminophen; Statins; Gabapentin; and Tylox  Home Medications   Prior to Admission medications   Medication Sig Start Date End Date Taking? Authorizing Provider  albuterol (PROAIR HFA) 108 (90 BASE) MCG/ACT inhaler Use 2 puffs every 4 hours  as needed for wheezing 05/21/15   Olam Idler, MD  albuterol (PROVENTIL) (2.5 MG/3ML) 0.083% nebulizer solution Take 3 mLs (2.5 mg total) by nebulization every 4 (four) hours as needed for wheezing.  As Needed. 08/26/14   Hilton Sinclair, MD  aspirin 81 MG tablet Take 81 mg by mouth daily.    Historical Provider, MD  citalopram (CELEXA) 20 MG tablet Take 1 tablet (20 mg total) by mouth every morning. Order number [id number] 04/27/15   Olam Idler, MD  CRESTOR 20 MG tablet Take 1 tablet by mouth  daily 01/09/15   Hilton Sinclair, MD  EPINEPHrine (EPIPEN 2-PAK) 0.3 mg/0.3 mL IJ SOAJ injection Inject 0.3 mLs (0.3 mg total) into the muscle as needed. For severe allergic reaction, and contact medical provider. 05/21/15   Olam Idler, MD  fluticasone (FLOVENT HFA) 110 MCG/ACT inhaler Inhale 1 puff into  the lungs 2 (two) times daily. 11/05/13   Hilton Sinclair, MD  furosemide (LASIX) 20 MG tablet Take 1 tablet by mouth  every morning 05/21/15   Olam Idler, MD  glipiZIDE (GLIPIZIDE XL) 10 MG 24 hr tablet Take 1 tablet by mouth  daily with breakfast 04/27/15   Olam Idler, MD  glucose blood test strip Use for blood sugar testing 1-3 times daily. 09/15/14   Hilton Sinclair, MD  HYDROcodone-acetaminophen (NORCO/VICODIN) 5-325 MG per tablet Take 1-2 tablets by mouth every 4 (four) hours as needed for moderate pain. 09/27/14   Danae Orleans, PA-C  Lancets MISC Use with blood sugar monitor. 02/03/14   Hilton Sinclair, MD  levothyroxine (SYNTHROID, LEVOTHROID) 75 MCG tablet Take 1 tablet (75 mcg total) by mouth daily before breakfast. Order number [id number]. Needs thyroid check. 08/19/14   Hilton Sinclair, MD  losartan (COZAAR) 50 MG tablet Take 1 tablet (50 mg total) by mouth daily 01/09/15   Hilton Sinclair, MD  metFORMIN (GLUCOPHAGE) 500 MG tablet Take 2 tablets (1,000 mg total) by mouth 2 (two) times daily with a meal. 04/27/15   Olam Idler, MD  metFORMIN (GLUCOPHAGE) 500 MG tablet Take 2 tablets (1,000 mg total) by mouth 2 (two) times daily with a meal. 06/02/15   Olam Idler, MD  methocarbamol (ROBAXIN) 500 MG tablet Take 1 tablet (500 mg total) by mouth every 6 (six) hours as needed for muscle spasms. 09/27/14   Danae Orleans, PA-C  Omega-3 Fatty Acids (FISH OIL) 1000 MG CAPS Take 1 capsule by mouth every morning.    Historical Provider, MD  omeprazole (PRILOSEC) 20 MG capsule TAKE 1 CAPSULE BY MOUTH TWICE DAILY 05/21/15   Olam Idler, MD  ondansetron (ZOFRAN) 4 MG tablet Take 1 tablet (4 mg total) by mouth every 8 (eight) hours as needed for nausea. Patient not taking: Reported on 05/21/2015 08/06/13   Hilton Sinclair, MD  polyethylene glycol powder (GLYCOLAX/MIRALAX) powder Take 17 g by mouth 2 (two) times daily. 08/26/14   Hilton Sinclair, MD  potassium  chloride SA (K-DUR,KLOR-CON) 20 MEQ tablet Take 1 tablet (20 mEq total) by mouth daily. Patient taking differently: Take 10 mEq by mouth daily.  09/15/14   Hilton Sinclair, MD  propranolol (INDERAL) 40 MG tablet Take 1 tablet (40 mg total) by mouth daily. 09/15/14   Hilton Sinclair, MD  sitaGLIPtin (JANUVIA) 100 MG tablet Take 1 tablet by mouth  every morning 04/27/15   Olam Idler, MD  Travoprost, BAK Free, (TRAVATAN) 0.004 % SOLN ophthalmic solution Place 1 drop into both eyes at bedtime. 09/15/14   Hilton Sinclair, MD  traZODone (DESYREL) 50 MG tablet Take 0.5 tablets (25 mg total) by mouth at bedtime. 09/10/14   Jayce  G Cook, DO  triamcinolone cream (KENALOG) 0.5 % APPLY TOPICALLY TO ITCHY AREAS TWICE DAILY AS NEEDED FOR ITCHING UNTIL 2 TO 3 DAYS AFTER HEALED; Order number [id number] 09/15/14   Hilton Sinclair, MD   BP 117/77 mmHg  Pulse 81  Temp(Src) 98.5 F (36.9 C) (Oral)  Resp 16  Ht 5\' 2"  (1.575 m)  Wt 130 lb (58.968 kg)  BMI 23.77 kg/m2  SpO2 99% Physical Exam  Constitutional: She is oriented to person, place, and time. She appears well-developed and well-nourished.  HENT:  Head: Normocephalic and atraumatic.  Right Ear: External ear normal.  Left Ear: External ear normal.  Eyes: Conjunctivae and EOM are normal. Pupils are equal, round, and reactive to light.  Neck: Normal range of motion and phonation normal. Neck supple.  Cardiovascular: Normal rate, regular rhythm and normal heart sounds.   Pulmonary/Chest: Effort normal and breath sounds normal. She exhibits no bony tenderness.  Pacemaker left upper chest wall is palpable and area around pacemaker is mildly tender to palpation. There is no associated swelling or erythema or drainage.  Abdominal: Soft. There is no tenderness.  Musculoskeletal: Normal range of motion.  Neurological: She is alert and oriented to person, place, and time. No cranial nerve deficit or sensory deficit. She exhibits normal muscle tone.  Coordination normal.  Skin: Skin is warm, dry and intact.  Psychiatric: She has a normal mood and affect. Her behavior is normal. Judgment and thought content normal.  Nursing note and vitals reviewed.   ED Course  Procedures (including critical care time)  Medications - No data to display  Patient Vitals for the past 24 hrs:  BP Temp Temp src Pulse Resp SpO2 Height Weight  06/07/15 1224 117/77 mmHg 98.5 F (36.9 C) Oral 81 16 99 % 5\' 2"  (1.575 m) 130 lb (58.968 kg)    Findings discussed with patient, and daughter, all questions were answered.     Labs Review Labs Reviewed  BASIC METABOLIC PANEL - Abnormal; Notable for the following:    Chloride 97 (*)    Glucose, Bld 196 (*)    All other components within normal limits  CBC - Abnormal; Notable for the following:    Platelets 81 (*)    All other components within normal limits  I-STAT TROPOININ, ED    Imaging Review Dg Chest 2 View  06/07/2015  CLINICAL DATA:  Chest pain EXAM: CHEST  2 VIEW COMPARISON:  09/18/2014 FINDINGS: Left chest wall pacer device is noted with leads in the right atrial appendage and right ventricle. Abandoned right chest wall pacer leads are identified. Normal heart size. No pleural effusion or edema. IMPRESSION: 1. No acute cardiopulmonary abnormalities. Electronically Signed   By: Kerby Moors M.D.   On: 06/07/2015 13:02   I have personally reviewed and evaluated these images and lab results as part of my medical decision-making.   EKG Interpretation None      MDM   Final diagnoses:  Chest wall pain    Chest wall pain associated pacemaker site, possibly due to breakdown of the pacemaker pocket. There is no evidence for chest wall infection, ACS, PE or pneumonia.  Nursing Notes Reviewed/ Care Coordinated Applicable Imaging Reviewed Interpretation of Laboratory Data incorporated into ED treatment  The patient appears reasonably screened and/or stabilized for discharge and I doubt any  other medical condition or other [facility] requiring further screening, evaluation, or treatment in the ED at this time prior to discharge.  Plan: Home Medications- usual; Home  Treatments- rest; return here if the recommended treatment, does not improve the symptoms; Recommended follow up- PCP, when necessary, and cardiology as scheduled.     Daleen Bo, MD 06/07/15 [phone]

## 2015-06-07 NOTE — ED Notes (Signed)
Pt states L sided chest pain since Fri.  Pain is sharp and radiates to sternum.  Pt has medtronic pacemaker that was placed approx 10 years ago.  Denies dizziness or sob, but c/o some nausea.

## 2015-06-09 ENCOUNTER — Encounter: Payer: Self-pay | Admitting: *Deleted

## 2015-06-12 ENCOUNTER — Ambulatory Visit (INDEPENDENT_AMBULATORY_CARE_PROVIDER_SITE_OTHER): Payer: Medicare Other | Admitting: Family Medicine

## 2015-06-12 ENCOUNTER — Encounter: Payer: Self-pay | Admitting: Family Medicine

## 2015-06-12 VITALS — BP 124/64 | HR 85 | Temp 98.3°F | Ht 62.0 in | Wt 131.0 lb

## 2015-06-12 DIAGNOSIS — R251 Tremor, unspecified: Secondary | ICD-10-CM | POA: Insufficient documentation

## 2015-06-12 DIAGNOSIS — M25512 Pain in left shoulder: Secondary | ICD-10-CM

## 2015-06-12 HISTORY — DX: Tremor, unspecified: R25.1

## 2015-06-12 MED ORDER — METHYLPREDNISOLONE ACETATE 40 MG/ML IJ SUSP
80.0000 mg | Freq: Once | INTRAMUSCULAR | Status: AC
  Administered 2015-06-12: 80 mg via INTRA_ARTICULAR

## 2015-06-12 NOTE — Assessment & Plan Note (Signed)
Persistent symptoms of frozen shoulder.  Despite home stretching program.  - Ultrasound-guided glenohumeral injection performed today - Recommended physical therapy as soon as scat transportation is finalized - Reeducated on home excess program

## 2015-06-12 NOTE — Progress Notes (Signed)
  Patient name: Mercedes Barr MRN [id number]  Date of birth: April 16, 1946  CC & HPI:  Mercedes Barr is a 69 y.o. female presenting today for left shoulder pain and decreased ROM.  She continues to have persistent symptoms of frozen shoulder with limited range of motion and stiffness.  She has been doing home stretches with minimal improvement.  She has been unable to start physical therapy due to still waiting for approval for scat transportation.  She desires steroid injection today.   ROS: See HPI   Medical & Surgical Hx:  Reviewed  Medications & Allergies: Reviewed  Social History: Reviewed:   Objective Findings:  Vitals: BP 124/64 mmHg  Pulse 85  Temp(Src) 98.3 F (36.8 C) (Oral)  Ht 5\' 2"  (1.575 m)  Wt 131 lb (59.421 kg)  BMI 23.95 kg/m2  SpO2 98%  Gen: NAD Left shoulder: Abduction is limited to 80; external rotation limited to 15  Ultrasound-guided glenohumeral injection Consent obtained and verified. Sterile prep with alcohol. Topical analgesic spray: Ethyl chloride. Joint: Left glenohumeral  Approached in typical fashion with: Lateral approach Completed without difficulty Meds: Depo-Medrol 40 mg 2cc, lidocaine 1% 4 cc Needle: 21-gauge 1&1/2 inch Aftercare instructions and Red flags advised.   Assessment & Plan:   Left shoulder pain Persistent symptoms of frozen shoulder.  Despite home stretching program.  - Ultrasound-guided glenohumeral injection performed today - Recommended physical therapy as soon as scat transportation is finalized - Reeducated on home excess program  Tremor of left hand She reports left hand resting tremor x 3 years previously treated with "medication" - advised her to make appointment in next few weeks for evaluation

## 2015-06-12 NOTE — Assessment & Plan Note (Signed)
She reports left hand resting tremor x 3 years previously treated with "medication" - advised her to make appointment in next few weeks for evaluation

## 2015-06-12 NOTE — Patient Instructions (Signed)
It was great seeing you today.   1. The steroid shot would help with your pain and range of motion over the next 3-5 days.  The most important thing is to continue to do your home stretches and follow up with physical therapy.  1. Usual cane to do left arm lifting and rotation stretches: Hold for 10 seconds, 10 repetitions,  twice a day 2. Call if you develop any redness, swelling, worsening pain or fevers after 24 hours   Please bring all your medications to every doctors visit  Sign up for My Chart to have easy access to your labs results, and communication with your Primary care physician.  Next Appointment  Please make an appointment with Dr Berkley Harvey after 07/28/15 for your diabetes and hand tremor    I look forward to talking with you again at our next visit. If you have any questions or concerns before then, please call the clinic at [phone].  Take Care,   Dr Phill Myron

## 2015-06-15 ENCOUNTER — Other Ambulatory Visit: Payer: Self-pay | Admitting: Family Medicine

## 2015-06-15 NOTE — Telephone Encounter (Signed)
Refill request from pharmacy. Will forward to PCP for review. Jennilyn Esteve, CMA. 

## 2015-06-23 ENCOUNTER — Encounter: Payer: Medicare Other | Admitting: Cardiovascular Disease

## 2015-06-24 ENCOUNTER — Encounter: Payer: Self-pay | Admitting: Cardiovascular Disease

## 2015-06-24 ENCOUNTER — Ambulatory Visit (INDEPENDENT_AMBULATORY_CARE_PROVIDER_SITE_OTHER): Payer: Medicare Other | Admitting: Cardiovascular Disease

## 2015-06-24 VITALS — BP 100/60 | HR 64 | Ht 62.5 in | Wt 131.2 lb

## 2015-06-24 DIAGNOSIS — Z950 Presence of cardiac pacemaker: Secondary | ICD-10-CM | POA: Diagnosis not present

## 2015-06-24 DIAGNOSIS — I442 Atrioventricular block, complete: Secondary | ICD-10-CM

## 2015-06-24 DIAGNOSIS — I10 Essential (primary) hypertension: Secondary | ICD-10-CM

## 2015-06-24 MED ORDER — LOSARTAN POTASSIUM 25 MG PO TABS: 25.0000 mg | ORAL_TABLET | Freq: Every day | ORAL | Status: DC

## 2015-06-24 NOTE — Patient Instructions (Signed)
Your physician has recommended you make the following change in your medication: DECREASE LOSARTAN TO 25 MG DAILY.  A NEW RX HAS BEEN SENT TO YOUR PHARMACY.  Remote monitoring is used to monitor your Pacemaker from home. This monitoring reduces the number of office visits required to check your device to one time per year. It allows [place] to monitor the functioning of your device to ensure it is working properly. You are scheduled for a device check from home on September 25, 2015. You may send your transmission at any time that day. If you have a wireless device, the transmission will be sent automatically. After your physician reviews your transmission, you will receive a postcard with your next transmission date.  Dr. Sallyanne Kuster recommends that you schedule a follow-up appointment in: [place]

## 2015-06-24 NOTE — Progress Notes (Signed)
Patient ID: Mercedes Barr, female   DOB: 1945-11-14, 69 y.o.   MRN: [id number]     Cardiology Office Note   Date:  06/24/2015   ID:  Mercedes Barr, DOB [birth year], MRN [id number]  PCP:  Phill Myron, MD  Cardiologist:   Sanda Klein, MD   Chief Complaint  Patient presents with  . Follow-up    no chest pain, occassional shortness of breath, no edema, no pain in legs, no cramping in legs, no lightheadedness, no dizziness      History of Present Illness: Mercedes Barr is a 69 y.o. 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   She has little in way of cardiovascular complaints specifically denies chest tightness or dyspnea, either at rest or with exertion. She does describe fatigue but recent labs show normal thyroid function , no evidence of anemia and her pacemaker shows decent heart rate histogram distribution, especially considering that she is relatively sedentary and walks with a cane. Note is made that her systolic blood pressure is borderline low. She reports that her last hemoglobin A1c was 8% but that  He has specks we will see an improvement when she checks it next month.  Has not had palpitations and denies syncope. Every time her pacemaker checked she has a brief sensation of near syncope , probably when the magnet rate kicks in. The same happened today.  She was in the emergency room on October 30 with complaints of chest pain.  She had reproducible tenderness in the left upper chest wall, without evidence of swelling , redness or other signs of infection surrounding the pacemaker.  Later her pain became localized to her left shoulder and she had symptoms of frozen joint and went to Dr. Kennis Carina office on November 4 requesting a steroid injection, performed with ultrasound guidance  Pacemaker interrogation  In the office today shows normal function. She has a  Medtronic Adapta device that still has about 20 months of expected generator longevity. She has 80% atrial pacing and 100% ventricular pacing. Only 4 episodes of mode switch have occurred all of which were well under 1 minute in duration. There is no evidence of atrial fibrillation.  She had surgical repair of atrioventricular cushion defect repair in 1988 when she was 69 years old. Surgery was complicated by development of complete heart block and she received a pacemaker (her initial device probably was an abdominal generator with epicardial leads). A right subclavian pacemaker was placed in 1991, but had to be removed because of complaints of pain. She had a complete revision of the system in 1999 and her current left subclavian atrial and ventricular leads date to that time. Her most recent generator change out occurred in 2008. The atrial lead is a Medtronic [id number]. The ventricular lead is Medtronic [id number]. The current generator is a Medtronic Adapta.  Past Medical History  Diagnosis Date  . Diabetes mellitus   . Hypertension   . Hypothyroidism   . COPD (chronic obstructive pulmonary disease) ([place])   . Heart murmur   . GERD (gastroesophageal reflux disease)   . 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 [place].  OFFICE NOTE DR. A. LITTLE STATES  "EXTREMELY PACEMAKER SENSITIVE AND WHEN YOU TRY TO CHECK FOR UNDERLYING RHYTHMS SHE WILL HAVE SNYCOPE AND WE HAVE NOT DONE THIS NOW  IN ABOUT 2 YEARS".  . Normal coronary arteries 2011  . Complete heart block ([place])   . Coronary artery disease   . Shortness of breath dyspnea     walking distance or climbing stairs  . Asthma     Past Surgical History  Procedure Laterality Date  . Pacemaker insertion  1988    last gen 11/08- MDT  . Tubal ligation    . Cardiac catheterization  2011     SHOWED NO CAD-PER CARDIOLOGY OFFICE NOTES DR. A. LITTLE  . Atrioventricular cushion defect repair   1988  . Total knee arthroplasty Right 11/19/2012    Procedure: RIGHT TOTAL KNEE ARTHROPLASTY;  Surgeon: Gearlean Alf, MD;  Location: [facility];  Service: Orthopedics;  Laterality: Right;  . Insert / replace / remove pacemaker    . Colonscopy       removed polyps   . Total knee revision Right 09/24/2014    Procedure: RIGHT TOTAL KNEE ARTHROPLASTY REVISION;  Surgeon: Gearlean Alf, MD;  Location: [facility];  Service: Orthopedics;  Laterality: Right;     Current Outpatient Prescriptions  Medication Sig Dispense Refill  . albuterol (PROAIR HFA) 108 (90 BASE) MCG/ACT inhaler Use 2 puffs every 4 hours  as needed for wheezing 25.5 g 3  . albuterol (PROVENTIL) (2.5 MG/3ML) 0.083% nebulizer solution Take 3 mLs (2.5 mg total) by nebulization every 4 (four) hours as needed for wheezing. As Needed. 75 mL 3  . aspirin 81 MG tablet Take 81 mg by mouth daily.    . citalopram (CELEXA) 20 MG tablet Take 1 tablet (20 mg total) by mouth every morning. Order number [id number] 30 tablet 5  . CRESTOR 20 MG tablet Take 1 tablet by mouth  daily 90 tablet 0  . EPINEPHrine (EPIPEN 2-PAK) 0.3 mg/0.3 mL IJ SOAJ injection Inject 0.3 mLs (0.3 mg total) into the muscle as needed. For severe allergic reaction, and contact medical provider. 1 Device 0  . fluticasone (FLOVENT HFA) 110 MCG/ACT inhaler Inhale 1 puff into the lungs 2 (two) times daily. 1 Inhaler 5  . furosemide (LASIX) 20 MG tablet Take 1 tablet by mouth  every morning 90 tablet 1  . glipiZIDE (GLUCOTROL XL) 10 MG 24 hr tablet Take 1 tablet by mouth  daily with breakfast 90 tablet 1  . glucose blood test strip Use for blood sugar testing 1-3 times daily. 100 each 11  . HYDROcodone-acetaminophen (NORCO/VICODIN) 5-325 MG per tablet Take 1-2 tablets by mouth every 4 (four) hours as needed for moderate pain. 100 tablet 0  . Lancets MISC Use with blood sugar monitor. 100 each 11  . levothyroxine (SYNTHROID, LEVOTHROID) 75 MCG tablet Take 1 tablet (75 mcg total) by mouth  daily before breakfast. Order number [id number]. Needs thyroid check. 30 tablet 2  . losartan (COZAAR) 25 MG tablet Take 1 tablet (25 mg total) by mouth daily. 90 tablet 3  . metFORMIN (GLUCOPHAGE) 500 MG tablet Take 2 tablets (1,000 mg total) by mouth 2 (two) times daily with a meal. 360 tablet 3  . methocarbamol (ROBAXIN) 500 MG tablet Take 1 tablet (500 mg total) by mouth every 6 (six) hours as needed for muscle spasms. 80 tablet 0  . Omega-3 Fatty Acids (FISH OIL) 1000 MG CAPS Take 1 capsule by mouth every morning.    Marland Kitchen omeprazole (PRILOSEC) 20 MG capsule TAKE 1 CAPSULE BY MOUTH TWICE DAILY 180 capsule 1  . ondansetron (ZOFRAN) 4 MG tablet Take 1 tablet (4 mg total)  by mouth every 8 (eight) hours as needed for nausea. 20 tablet 0  . polyethylene glycol powder (GLYCOLAX/MIRALAX) powder Take 17 g by mouth 2 (two) times daily. 3350 g 11  . potassium chloride SA (K-DUR,KLOR-CON) 20 MEQ tablet Take 1 tablet (20 mEq total) by mouth daily. (Patient taking differently: Take 10 mEq by mouth daily. ) 90 tablet 1  . propranolol (INDERAL) 40 MG tablet Take 1 tablet (40 mg total) by mouth daily. 90 tablet 1  . sitaGLIPtin (JANUVIA) 100 MG tablet Take 1 tablet by mouth  every morning 90 tablet 3  . Travoprost, BAK Free, (TRAVATAN) 0.004 % SOLN ophthalmic solution Place 1 drop into both eyes at bedtime. 1 Bottle 5  . traZODone (DESYREL) 50 MG tablet Take 0.5 tablets (25 mg total) by mouth at bedtime. 30 tablet 3  . triamcinolone cream (KENALOG) 0.5 % APPLY TOPICALLY TO ITCHY AREAS TWICE DAILY AS NEEDED FOR ITCHING UNTIL 2 TO 3 DAYS AFTER HEALED; Order number [id number] 15 g 0  . [DISCONTINUED] albuterol (PROVENTIL,VENTOLIN) 90 MCG/ACT inhaler Inhale 2 puffs into the lungs every 4 (four) hours as needed for wheezing or shortness of breath. 17 g 5   No current facility-administered medications for this visit.    Allergies:   Chlorhexidine; Pregabalin; Amitriptyline hcl; Benazepril hcl; Naproxen; Propoxyphene  n-acetaminophen; Statins; Gabapentin; and Tylox    Social History:  The patient  reports that she has never smoked. She has never used smokeless tobacco. She reports that she does not drink alcohol or use illicit drugs.   Family History:  The patient's family history includes Heart failure in her mother and sister.    ROS:  Please see the history of present illness.    Otherwise, review of systems positive for none.   All other systems are reviewed and negative.    PHYSICAL EXAM: VS:  BP 100/60 mmHg  Pulse 64  Ht 5' 2.5" (1.588 m)  Wt 131 lb 4 oz (59.535 kg)  BMI 23.61 kg/m2 , BMI Body mass index is 23.61 kg/(m^2).  General: Alert, oriented x3, no distress Head: no evidence of trauma, PERRL, EOMI, no exophtalmos or lid lag, no myxedema, no xanthelasma; normal ears, nose and oropharynx Neck: normal jugular venous pulsations and no hepatojugular reflux; brisk carotid pulses without delay and no carotid bruits Chest: clear to auscultation, no signs of consolidation by percussion or palpation, normal fremitus, symmetrical and full respiratory excursions,  Healthy left subclavian pacemaker site Cardiovascular: normal position and quality of the apical impulse, regular rhythm, normal first and paradoxically split second heart sounds, no murmurs, rubs or gallops Abdomen: no tenderness or distention, no masses by palpation, no abnormal pulsatility or arterial bruits, normal bowel sounds, no hepatosplenomegaly Extremities: no clubbing, cyanosis or edema; 2+ radial, ulnar and brachial pulses bilaterally; 2+ right femoral, posterior tibial and dorsalis pedis pulses; 2+ left femoral, posterior tibial and dorsalis pedis pulses; no subclavian or femoral bruits Neurological: grossly nonfocal Psych: euthymic mood, full affect   EKG:  EKG is not ordered today. The ekg ordered  October 30 demonstrates  AV sequential pacing   Recent Labs: 09/18/2014: ALT 18 04/27/2015: TSH 0.696 06/07/2015: BUN 10;  Creatinine, Ser 0.71; Hemoglobin 13.6; Platelets 81*; Potassium 3.5; Sodium 135    Lipid Panel    Component Value Date/Time   CHOL 197 08/18/2014 1157   TRIG 202* 08/18/2014 1157   HDL 35* 08/18/2014 1157   CHOLHDL 5.6 08/18/2014 1157   VLDL 40 08/18/2014 1157   LDLCALC 122*  08/18/2014 1157   LDLDIRECT 56 06/08/2012 1120      Wt Readings from Last 3 Encounters:  06/24/15 131 lb 4 oz (59.535 kg)  06/12/15 131 lb (59.421 kg)  06/07/15 130 lb (58.968 kg)      Other studies Reviewed: Additional studies/ records that were reviewed today include:  Records from the emergency department and primary care physician.   ASSESSMENT AND PLAN:  1. HYPERTENSION Her BP is low even after the DC of HCTZ. Continue propranolol and  Reduce the dose of losartan to 25 mg daily  2. Complete heart block Pacemaker dependent, occurred in 1988 after AV cushion defect repair.  3. Pacemaker- dual chamber Medtronic She has near syncope when we test for underlying rhythm, but we successfully checked thresholds on both leads. All other parameters were also normal. Continue Q6 months remote checks. Increase frequency of checks when longevity<12 months.  4. S/P  Surgical repair of endocardial cushion defect  normal LV function by most recent echo in 2014 , mild centrally directed mitral insufficiency , moderate tricuspid insufficiency.   Current medicines are reviewed at length with the patient today.  The patient does not have concerns regarding medicines.  The following changes have been made:   Reduce losartan to 25 mg daily  Labs/ tests ordered today include:  No orders of the defined types were placed in this encounter.     Patient Instructions  Your physician has recommended you make the following change in your medication: DECREASE LOSARTAN TO 25 MG DAILY.  A NEW RX HAS BEEN SENT TO YOUR PHARMACY.  Remote monitoring is used to monitor your Pacemaker from home. This monitoring reduces the  number of office visits required to check your device to one time per year. It allows [place] to monitor the functioning of your device to ensure it is working properly. You are scheduled for a device check from home on September 25, 2015. You may send your transmission at any time that day. If you have a wireless device, the transmission will be sent automatically. After your physician reviews your transmission, you will receive a postcard with your next transmission date.  Dr. Sallyanne Kuster recommends that you schedule a follow-up appointment in: ONE YEAR         SignedSanda Klein, MD  06/24/2015 5:38 PM    Sanda Klein, MD, [facility] [phone] office [phone] pager

## 2015-07-01 LAB — CUP PACEART INCLINIC DEVICE CHECK
Battery Impedance: 2163 Ohm
Battery Remaining Longevity: 20 mo
Battery Voltage: 2.73 V
Brady Statistic AP VP Percent: 79 %
Implantable Lead Implant Date: 19990203
Implantable Lead Location: 753859
Lead Channel Impedance Value: 664 Ohm
Lead Channel Impedance Value: 664 Ohm
Lead Channel Sensing Intrinsic Amplitude: 1.4 mV
Lead Channel Setting Pacing Amplitude: 2.5 V
Lead Channel Setting Pacing Pulse Width: 0.4 ms
MDC IDC LEAD IMPLANT DT: 19990203
MDC IDC LEAD LOCATION: 753860
MDC IDC MSMT LEADCHNL RA PACING THRESHOLD AMPLITUDE: 0.875 V
MDC IDC MSMT LEADCHNL RA PACING THRESHOLD PULSEWIDTH: 0.4 ms
MDC IDC MSMT LEADCHNL RV PACING THRESHOLD AMPLITUDE: 0.875 V
MDC IDC MSMT LEADCHNL RV PACING THRESHOLD PULSEWIDTH: 0.4 ms
MDC IDC SESS DTM: 20161116183353
MDC IDC SET LEADCHNL RA PACING AMPLITUDE: 2 V
MDC IDC SET LEADCHNL RV SENSING SENSITIVITY: 2.8 mV
MDC IDC STAT BRADY AP VS PERCENT: 0 %
MDC IDC STAT BRADY AS VP PERCENT: 21 %
MDC IDC STAT BRADY AS VS PERCENT: 0 %

## 2015-07-10 ENCOUNTER — Encounter: Payer: Self-pay | Admitting: Cardiovascular Disease

## 2015-07-14 ENCOUNTER — Ambulatory Visit (INDEPENDENT_AMBULATORY_CARE_PROVIDER_SITE_OTHER): Payer: Medicare Other | Admitting: Family Medicine

## 2015-07-14 ENCOUNTER — Encounter: Payer: Self-pay | Admitting: Family Medicine

## 2015-07-14 VITALS — BP 103/78 | HR 88 | Temp 98.2°F | Ht 63.0 in | Wt 132.0 lb

## 2015-07-14 DIAGNOSIS — M25561 Pain in right knee: Secondary | ICD-10-CM

## 2015-07-14 DIAGNOSIS — E119 Type 2 diabetes mellitus without complications: Secondary | ICD-10-CM

## 2015-07-14 DIAGNOSIS — R251 Tremor, unspecified: Secondary | ICD-10-CM | POA: Diagnosis not present

## 2015-07-14 LAB — POCT GLYCOSYLATED HEMOGLOBIN (HGB A1C): Hemoglobin A1C: 7.1

## 2015-07-14 MED ORDER — DESIPRAMINE HCL 25 MG PO TABS: 25.0000 mg | ORAL_TABLET | Freq: Every day | ORAL | Status: DC

## 2015-07-14 NOTE — Assessment & Plan Note (Addendum)
Right lateral knee pain in 2 cm area that sound neuropathic in nature.Possibly due to knee replacement surgery this year as pain started afterwards.  - Intolerant to lyrica, gabapentin and elavil  - Start desipramine 25 mg qhs  - she will call in 2-3 weeks to let me know if it is working - consider capsaicin

## 2015-07-14 NOTE — Patient Instructions (Signed)
It was great seeing you today.    Take desipramine 25 mg (1 pill) every night. Call in 2-3 weeks to let me know how this is working.   She below about Essential tremor.    Please bring all your medications to every doctors visit  Sign up for My Chart to have easy access to your labs results, and communication with your Primary care physician.  Next Appointment  Please call to make an appointment with Dr Berkley Harvey in 1-2 month for right knee pain.   I look forward to talking with you again at our next visit. If you have any questions or concerns before then, please call the clinic at [phone].  Take Care,   Dr Phill Myron  Essential Tremor A tremor is trembling or shaking that you cannot control. Most tremors affect the hands or arms. Tremors can also affect the head, vocal cords, face, and other parts of the body.  Essential tremor is a tremor without a known cause.  CAUSES Essential tremor has no known cause.  RISK FACTORS You may be at greater risk of essential tremor if:   You have a family member with essential tremor.   You are age 54 or older.   You take certain medicines. SIGNS AND SYMPTOMS The main sign of a tremor is uncontrolled and unintentional rhythmic shaking of a body part.  You may have difficulty eating with a spoon or fork.   You may have difficulty writing.   You may nod your head up and down or side to side.   You may have a quivering voice.  Your tremors:  May get worse over time.   May come and go.   May be more noticeable on one side of your body.   May get worse due to stress, fatigue, caffeine, and extreme heat or cold.  DIAGNOSIS Your health care provider can diagnose essential tremor based on your symptoms, medical history, and a physical examination. There is no single test to diagnose an essential tremor. However, your health care provider may perform a variety of tests to rule out other conditions. Tests may include:    Blood and urine tests.   Imaging studies of your brain, such as:   CT scan.   MRI.   A test that measures involuntary muscle movement (electromyogram). TREATMENT Your tremors may go away without treatment. Mild tremors may not need treatment if they do not affect your day-to-day life. Severe tremors may need to be treated using one or a combination of the following options:   Medicines. This may include medicine that is injected.  Lifestyle changes.   Physical therapy.  HOME CARE INSTRUCTIONS  Take medicines only as directed by your health care provider.   Limit alcohol intake to no more than 1 drink per day for nonpregnant women and 2 drinks per day for men. One drink equals 12 oz of beer, 5 oz of wine, or 1 oz of hard liquor.  Do not use any tobacco products, including cigarettes, chewing tobacco, or electronic cigarettes. If you need help quitting, ask your health care provider.  Take medicines only as directed by your health care provider.   Avoid extreme heat or cold.   Limit the amount of caffeine you consumeas directed by your health care provider.   Try to get eight hours of sleep each night.  Find ways to manage your stress, such as meditation or yoga.  Keep all follow-up visits as directed by your health  care provider. This is important. This includes any physical therapy visits. SEEK MEDICAL CARE IF:  You experience any changes in the location or intensity of your tremors.   You start having a tremor after starting a new medicine.   You have tremor with other symptoms such as:   Numbness.   Tingling.   Pain.   Weakness.   Your tremor gets worse.   Your tremor interferes with your daily life.    This information is not intended to replace advice given to you by your health care provider. Make sure you discuss any questions you have with your health care provider.   Document Released: 08/15/2014 Document Reviewed:  08/15/2014 Elsevier Interactive Patient Education [employer].

## 2015-07-14 NOTE — Progress Notes (Signed)
  Patient name: Mercedes Barr MRN [id number]  Date of birth: 10/20/1945  CC & HPI:  Mercedes Barr is a 69 y.o. female presenting today for right knee pain and hand tremor.   Right knee pain - burning / pins and needle sensation in 2 cm area of lateral right knee - No trauma; Hx of knee replacement this year.  - no swelling - no numbness in lower leg or foot  - no weakness  Hand tremor - Left hand tremor with holding positions and during movement - No tremor at rest - ongoing for ~ 3 years without worsening  - No speech or gait problems - no hand numbness / weakness  ROS: See HPI   Medical & Surgical Hx:  Reviewed  Medications & Allergies: Reviewed  Social History: Reviewed:   Objective Findings:  Vitals: BP 103/78 mmHg  Pulse 88  Temp(Src) 98.2 F (36.8 C) (Oral)  Ht 5\' 3"  (1.6 m)  Wt 132 lb (59.875 kg)  BMI 23.39 kg/m2  Gen: NAD CV: RRR w/o m/r/g, pulses +2 b/l Resp: CTAB w/ normal respiratory effort Left hand: posture and action tremor.  Neuro: normal gait Right knee: tenderness over lateral knee at area or burning sensation. No erythema or warmth. No rash. No knee swelling. Right foot: DP and PT pulses 2+; sensation intact  Assessment & Plan:   Tremor of left hand Likely essential tremor as not present at rest, and no change in ~ 3 years. No other neurological symptoms or gait problems - already on propranolol  - Discussed benign nature. No additional evaluation at this time - Continue to monitor   Right knee pain Right lateral knee pain in 2 cm area that sound neuropathic in nature.Possibly due to knee replacement surgery this year as pain started afterwards.  - Intolerant to lyrica, gabapentin and elavil  - Start desipramine 25 mg qhs  - she will call in 2-3 weeks to let me know if it is working - consider capsaicin

## 2015-07-14 NOTE — Assessment & Plan Note (Signed)
Likely essential tremor as not present at rest, and no change in ~ 3 years. No other neurological symptoms or gait problems - already on propranolol  - Discussed benign nature. No additional evaluation at this time - Continue to monitor

## 2015-07-22 ENCOUNTER — Telehealth: Payer: Self-pay | Admitting: Family Medicine

## 2015-07-22 NOTE — Telephone Encounter (Signed)
Will forward to MD to make him and see if there is something patient can take in its place. Miguel Medal,CMA

## 2015-07-22 NOTE — Telephone Encounter (Signed)
Pt calling to report that her medication, desipramine (NORPRAMIN) 25 MG tablet, is making her itch uncontrollably after 3 days and she refuses to take anymore of it. She would like to be prescribed something else that doesn't have this type of side effect. Thank you, Fonda Kinder, ASA

## 2015-07-24 NOTE — Telephone Encounter (Signed)
Call patient discussed desipramine.  She reports intolerances medication due to full body itching.  Discussed that she is are ready had difficulties with multiple neuropathic pain medications intolerant to Lyrica, gabapentin, amitriptyline, desipramine.  Recommended trying capsaicin cream; she reports intolerance to this in the past due to burning pain.  Discussed that she must start with low amounts and treat concurrently with ice packs.  She will try capsaicin and call back to report affects.

## 2015-08-11 ENCOUNTER — Telehealth: Payer: Self-pay | Admitting: Family Medicine

## 2015-08-11 DIAGNOSIS — E119 Type 2 diabetes mellitus without complications: Secondary | ICD-10-CM

## 2015-08-11 NOTE — Telephone Encounter (Signed)
Needs dr to fax referral to [facility].  Fax #  [phone]

## 2015-08-11 NOTE — Telephone Encounter (Signed)
Will forward to MD to place new referral for podiatry. Jazmin Hartsell,CMA

## 2015-08-26 DIAGNOSIS — M65872 Other synovitis and tenosynovitis, left ankle and foot: Secondary | ICD-10-CM | POA: Diagnosis not present

## 2015-08-26 DIAGNOSIS — M7989 Other specified soft tissue disorders: Secondary | ICD-10-CM | POA: Diagnosis not present

## 2015-08-26 DIAGNOSIS — M71572 Other bursitis, not elsewhere classified, left ankle and foot: Secondary | ICD-10-CM | POA: Diagnosis not present

## 2015-09-02 ENCOUNTER — Other Ambulatory Visit: Payer: Self-pay | Admitting: Family Medicine

## 2015-09-02 ENCOUNTER — Telehealth: Payer: Self-pay | Admitting: Family Medicine

## 2015-09-02 DIAGNOSIS — E1110 Type 2 diabetes mellitus with ketoacidosis without coma: Secondary | ICD-10-CM

## 2015-09-02 DIAGNOSIS — I5030 Unspecified diastolic (congestive) heart failure: Secondary | ICD-10-CM

## 2015-09-02 DIAGNOSIS — J42 Unspecified chronic bronchitis: Secondary | ICD-10-CM

## 2015-09-02 DIAGNOSIS — I10 Essential (primary) hypertension: Secondary | ICD-10-CM

## 2015-09-02 DIAGNOSIS — K219 Gastro-esophageal reflux disease without esophagitis: Secondary | ICD-10-CM

## 2015-09-02 DIAGNOSIS — R259 Unspecified abnormal involuntary movements: Secondary | ICD-10-CM

## 2015-09-02 MED ORDER — ALBUTEROL SULFATE HFA 108 (90 BASE) MCG/ACT IN AERS: INHALATION_SPRAY | RESPIRATORY_TRACT | Status: DC

## 2015-09-02 MED ORDER — ALBUTEROL SULFATE (2.5 MG/3ML) 0.083% IN NEBU: 2.5000 mg | INHALATION_SOLUTION | RESPIRATORY_TRACT | Status: DC | PRN

## 2015-09-02 MED ORDER — LOSARTAN POTASSIUM 25 MG PO TABS: 25.0000 mg | ORAL_TABLET | Freq: Every day | ORAL | Status: DC

## 2015-09-02 MED ORDER — POTASSIUM CHLORIDE CRYS ER 20 MEQ PO TBCR: 20.0000 meq | EXTENDED_RELEASE_TABLET | Freq: Every day | ORAL | Status: DC

## 2015-09-02 MED ORDER — PROPRANOLOL HCL 40 MG PO TABS: 40.0000 mg | ORAL_TABLET | Freq: Every day | ORAL | Status: DC

## 2015-09-02 MED ORDER — FUROSEMIDE 20 MG PO TABS: ORAL_TABLET | ORAL | Status: DC

## 2015-09-02 MED ORDER — OMEPRAZOLE 20 MG PO CPDR: DELAYED_RELEASE_CAPSULE | ORAL | Status: DC

## 2015-09-02 MED ORDER — GLIPIZIDE ER 10 MG PO TB24: ORAL_TABLET | ORAL | Status: DC

## 2015-09-02 NOTE — Telephone Encounter (Signed)
Patient needs a refill on the following medications sent to [facility]. Please contact pt once completed. Thank you, Sadie Reynolds, ASA   glipiZIDE (GLUCOTROL XL) 10 MG   furosemide (LASIX) 20 MG tablet   albuterol (PROAIR HFA) 108 (90 BASE)   omeprazole (PRILOSEC) 20 MG capsule   losartan (COZAAR) 25 MG tablet   propranolol (INDERAL) 40 MG tablet   potassium chloride SA (K-DUR,KLOR-CON) 20 MEQ tablet   triamcinolone cream (KENALOG) 0.5 %

## 2015-09-02 NOTE — Telephone Encounter (Signed)
Spoke with patient and she is aware of this.  States that she has a boot on her foot and will plan to call back and make an appt next week. Erickson Yamashiro,CMA

## 2015-09-09 DIAGNOSIS — M7989 Other specified soft tissue disorders: Secondary | ICD-10-CM | POA: Diagnosis not present

## 2015-09-09 DIAGNOSIS — M722 Plantar fascial fibromatosis: Secondary | ICD-10-CM | POA: Diagnosis not present

## 2015-09-09 DIAGNOSIS — M7752 Other enthesopathy of left foot: Secondary | ICD-10-CM | POA: Diagnosis not present

## 2015-09-10 DIAGNOSIS — Z96651 Presence of right artificial knee joint: Secondary | ICD-10-CM | POA: Diagnosis not present

## 2015-09-10 DIAGNOSIS — Z471 Aftercare following joint replacement surgery: Secondary | ICD-10-CM | POA: Diagnosis not present

## 2015-09-21 ENCOUNTER — Other Ambulatory Visit: Payer: Self-pay | Admitting: Family Medicine

## 2015-09-30 ENCOUNTER — Emergency Department (HOSPITAL_COMMUNITY)
Admission: EM | Admit: 2015-09-30 | Discharge: 2015-09-30 | Disposition: A | Discharge status: Home / Self Care | Payer: Medicare Other | Attending: Emergency Medicine | Admitting: Emergency Medicine

## 2015-09-30 ENCOUNTER — Telehealth: Payer: Self-pay | Admitting: Cardiology

## 2015-09-30 ENCOUNTER — Encounter (HOSPITAL_COMMUNITY): Payer: Self-pay

## 2015-09-30 ENCOUNTER — Emergency Department (HOSPITAL_COMMUNITY): Payer: Medicare Other

## 2015-09-30 DIAGNOSIS — M1990 Unspecified osteoarthritis, unspecified site: Secondary | ICD-10-CM | POA: Insufficient documentation

## 2015-09-30 DIAGNOSIS — Z7951 Long term (current) use of inhaled steroids: Secondary | ICD-10-CM | POA: Insufficient documentation

## 2015-09-30 DIAGNOSIS — Z4502 Encounter for adjustment and management of automatic implantable cardiac defibrillator: Secondary | ICD-10-CM

## 2015-09-30 DIAGNOSIS — Z95810 Presence of automatic (implantable) cardiac defibrillator: Secondary | ICD-10-CM | POA: Diagnosis not present

## 2015-09-30 DIAGNOSIS — Z7982 Long term (current) use of aspirin: Secondary | ICD-10-CM | POA: Insufficient documentation

## 2015-09-30 DIAGNOSIS — E119 Type 2 diabetes mellitus without complications: Secondary | ICD-10-CM | POA: Insufficient documentation

## 2015-09-30 DIAGNOSIS — Z7984 Long term (current) use of oral hypoglycemic drugs: Secondary | ICD-10-CM | POA: Insufficient documentation

## 2015-09-30 DIAGNOSIS — I2510 Atherosclerotic heart disease of native coronary artery without angina pectoris: Secondary | ICD-10-CM | POA: Diagnosis not present

## 2015-09-30 DIAGNOSIS — Z79899 Other long term (current) drug therapy: Secondary | ICD-10-CM | POA: Diagnosis not present

## 2015-09-30 DIAGNOSIS — E039 Hypothyroidism, unspecified: Secondary | ICD-10-CM | POA: Insufficient documentation

## 2015-09-30 DIAGNOSIS — Z9889 Other specified postprocedural states: Secondary | ICD-10-CM | POA: Diagnosis not present

## 2015-09-30 DIAGNOSIS — R0789 Other chest pain: Secondary | ICD-10-CM | POA: Diagnosis not present

## 2015-09-30 DIAGNOSIS — R079 Chest pain, unspecified: Secondary | ICD-10-CM | POA: Diagnosis not present

## 2015-09-30 DIAGNOSIS — R0602 Shortness of breath: Secondary | ICD-10-CM | POA: Diagnosis not present

## 2015-09-30 DIAGNOSIS — K219 Gastro-esophageal reflux disease without esophagitis: Secondary | ICD-10-CM | POA: Insufficient documentation

## 2015-09-30 DIAGNOSIS — I10 Essential (primary) hypertension: Secondary | ICD-10-CM | POA: Insufficient documentation

## 2015-09-30 DIAGNOSIS — R011 Cardiac murmur, unspecified: Secondary | ICD-10-CM | POA: Insufficient documentation

## 2015-09-30 DIAGNOSIS — J441 Chronic obstructive pulmonary disease with (acute) exacerbation: Secondary | ICD-10-CM | POA: Diagnosis not present

## 2015-09-30 LAB — CBC
HCT: 38 % (ref 36.0–46.0)
Hemoglobin: 12.2 g/dL (ref 12.0–15.0)
MCH: 28.2 pg (ref 26.0–34.0)
MCHC: 32.1 g/dL (ref 30.0–36.0)
MCV: 87.8 fL (ref 78.0–100.0)
PLATELETS: 218 10*3/uL (ref 150–400)
RBC: 4.33 MIL/uL (ref 3.87–5.11)
RDW: 13.4 % (ref 11.5–15.5)
WBC: 4.7 10*3/uL (ref 4.0–10.5)

## 2015-09-30 LAB — BASIC METABOLIC PANEL
Anion gap: 11 (ref 5–15)
BUN: 11 mg/dL (ref 6–20)
CALCIUM: 9.7 mg/dL (ref 8.9–10.3)
CO2: 25 mmol/L (ref 22–32)
Chloride: 105 mmol/L (ref 101–111)
Creatinine, Ser: 0.77 mg/dL (ref 0.44–1.00)
GFR calc Af Amer: >60 mL/min (ref >60)
GFR calc non Af Amer: >60 mL/min (ref >60)
GLUCOSE: 182 mg/dL — AB (ref 65–99)
POTASSIUM: 3.6 mmol/L (ref 3.5–5.1)
SODIUM: 141 mmol/L (ref 135–145)

## 2015-09-30 LAB — I-STAT TROPONIN, ED
TROPONIN I, POC: 0 ng/mL (ref 0.00–0.08)
TROPONIN I, POC: 0 ng/mL (ref 0.00–0.08)

## 2015-09-30 NOTE — ED Notes (Signed)
Attempted to interrogate pt's pacemaker. Pt became extremely anxious, short of breath, and tearful. Pt asked this RN to stop. Notified EDP.

## 2015-09-30 NOTE — ED Notes (Signed)
Pt brought in EMS for chest pain while interrogating pacemaker.  Pt reports she interrogates it every 6 months and gets SOB, n/v and diaphoretic each time.  All other symptoms have alleviated except for chest pain at 3/10, substernal, non-radiating.  Pt took 324mg  ASA and 2 nitro PTA.

## 2015-09-30 NOTE — ED Provider Notes (Signed)
CSN: [place]:[id number]     Arrival date & time 09/30/15  1737 History   First MD Initiated Contact with Patient 09/30/15 1751     Chief Complaint  Patient presents with  . Chest Pain     (Consider location/radiation/quality/duration/timing/severity/associated sxs/prior Treatment) HPI Comments: Pt comes in with cc of pacemaker evaluation. Pt has hx of AICD placement for AC block, she usually gets it interrogated by Cardiologist, but this time she had the interrogation device sent to her home. Pt placed the magnet for 3 min, and she started having chest heaviness, she doesn't think she had palpitations. + dib, nausea. No near fainting, syncope. Pt still has mild chest pain.    The history is provided by the patient.    Past Medical History  Diagnosis Date  . Diabetes mellitus   . Hypertension   . Hypothyroidism   . COPD (chronic obstructive pulmonary disease) ([place])   . Heart murmur   . GERD (gastroesophageal reflux disease)   . 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 [place].  OFFICE NOTE DR. A. LITTLE STATES  "EXTREMELY PACEMAKER SENSITIVE AND WHEN YOU TRY TO CHECK FOR UNDERLYING RHYTHMS SHE WILL HAVE SNYCOPE AND WE HAVE NOT DONE THIS NOW IN ABOUT 2 YEARS".  . Normal coronary arteries 2011  . Complete heart block ([place])   . Coronary artery disease   . Shortness of breath dyspnea     walking distance or climbing stairs  . Asthma    Past Surgical History  Procedure Laterality Date  . Pacemaker insertion  1988    last gen 11/08- MDT  . Tubal ligation    . Cardiac catheterization  2011     SHOWED NO CAD-PER CARDIOLOGY OFFICE NOTES DR. A. LITTLE  . Atrioventricular cushion defect repair  1988  . Total knee arthroplasty Right 11/19/2012    Procedure: RIGHT TOTAL KNEE ARTHROPLASTY;  Surgeon: Gearlean Alf, MD;  Location: [facility];  Service: Orthopedics;  Laterality: Right;  . Insert / replace / remove pacemaker    .  Colonscopy       removed polyps   . Total knee revision Right 09/24/2014    Procedure: RIGHT TOTAL KNEE ARTHROPLASTY REVISION;  Surgeon: Gearlean Alf, MD;  Location: [facility];  Service: Orthopedics;  Laterality: Right;   Family History  Problem Relation Age of Onset  . Heart failure Mother   . Heart failure Sister    Social History  Substance Use Topics  . Smoking status: Never Smoker   . Smokeless tobacco: Never Used  . Alcohol Use: No   OB History    No data available     Review of Systems  Constitutional: Negative for activity change.  Respiratory: Positive for chest tightness and shortness of breath.   Cardiovascular: Positive for chest pain.  Gastrointestinal: Negative for nausea, vomiting and abdominal pain.  Genitourinary: Negative for dysuria.  Musculoskeletal: Negative for neck pain.  Neurological: Negative for headaches.  All other systems reviewed and are negative.     Allergies  Chlorhexidine; Naproxen; Pregabalin; Propoxyphene n-acetaminophen; Statins; Amitriptyline hcl; Benazepril hcl; Gabapentin; and Tylox  Home Medications   Prior to Admission medications   Medication Sig Start Date End Date Taking? Authorizing Provider  albuterol [facility] HFA) 108 (90 Base) MCG/ACT inhaler Use 2 puffs every 4 hours  as needed for wheezing 09/02/15  Yes Olam Idler, MD  albuterol (PROVENTIL) (2.5 MG/3ML)  0.083% nebulizer solution Take 3 mLs (2.5 mg total) by nebulization every 4 (four) hours as needed for wheezing. As Needed. 09/02/15  Yes Olam Idler, MD  aspirin 81 MG tablet Take 81 mg by mouth daily.   Yes Historical Provider, MD  citalopram (CELEXA) 20 MG tablet Take 1 tablet (20 mg total) by mouth every morning. Order number [id number] 04/27/15  Yes Olam Idler, MD  CRESTOR 20 MG tablet Take 1 tablet by mouth  daily 01/09/15  Yes Hilton Sinclair, MD  EPINEPHrine (EPIPEN 2-PAK) 0.3 mg/0.3 mL IJ SOAJ injection Inject 0.3 mLs (0.3 mg total) into the muscle as  needed. For severe allergic reaction, and contact medical provider. 05/21/15  Yes Olam Idler, MD  fluticasone (FLOVENT HFA) 110 MCG/ACT inhaler Inhale 1 puff into the lungs 2 (two) times daily. 11/05/13  Yes Hilton Sinclair, MD  furosemide (LASIX) 20 MG tablet Take 1 tablet by mouth  every morning 09/02/15  Yes Olam Idler, MD  glipiZIDE (GLUCOTROL XL) 10 MG 24 hr tablet Take 1 tablet by mouth  daily with breakfast 09/02/15  Yes Olam Idler, MD  levothyroxine (SYNTHROID, LEVOTHROID) 75 MCG tablet Take 1 tablet (75 mcg total) by mouth daily before breakfast. Order number [id number]. Needs thyroid check. 08/19/14  Yes Hilton Sinclair, MD  losartan (COZAAR) 25 MG tablet Take 1 tablet (25 mg total) by mouth daily. 09/02/15  Yes Olam Idler, MD  metFORMIN (GLUCOPHAGE) 500 MG tablet Take 2 tablets (1,000 mg total) by mouth 2 (two) times daily with a meal. 04/27/15  Yes Olam Idler, MD  Omega-3 Fatty Acids (FISH OIL) 1000 MG CAPS Take 1 capsule by mouth every morning.   Yes Historical Provider, MD  omeprazole (PRILOSEC) 20 MG capsule TAKE 1 CAPSULE BY MOUTH TWICE DAILY 09/02/15  Yes Olam Idler, MD  polyethylene glycol powder (GLYCOLAX/MIRALAX) powder TAKE 17 G BY MOUTH 2 (TWO) TIMES DAILY. Patient taking differently: TAKE 17 G BY MOUTH 2 (TWO) TIMES DAILY AS NEEDED FOR LAXATIVE 09/02/15  Yes Olam Idler, MD  potassium chloride SA (K-DUR,KLOR-CON) 20 MEQ tablet Take 1 tablet (20 mEq total) by mouth daily. 09/02/15  Yes Olam Idler, MD  propranolol (INDERAL) 40 MG tablet Take 1 tablet (40 mg total) by mouth daily. 09/02/15  Yes Olam Idler, MD  sitaGLIPtin (JANUVIA) 100 MG tablet Take 1 tablet by mouth  every morning 04/27/15  Yes Olam Idler, MD  Travoprost, BAK Free, (TRAVATAN) 0.004 % SOLN ophthalmic solution Place 1 drop into both eyes at bedtime. 09/15/14  Yes Hilton Sinclair, MD  traZODone (DESYREL) 50 MG tablet Take 0.5 tablets (25 mg total) by mouth at bedtime. 09/10/14   Yes Thomas, DO  glucose blood test strip Use for blood sugar testing 1-3 times daily. 09/15/14   Hilton Sinclair, MD  HYDROcodone-acetaminophen (NORCO/VICODIN) 5-325 MG per tablet Take 1-2 tablets by mouth every 4 (four) hours as needed for moderate pain. 09/27/14   Danae Orleans, PA-C  Lancets MISC Use with blood sugar monitor. 02/03/14   Hilton Sinclair, MD  methocarbamol (ROBAXIN) 500 MG tablet Take 1 tablet (500 mg total) by mouth every 6 (six) hours as needed for muscle spasms. Patient not taking: Reported on 07/14/2015 09/27/14   Danae Orleans, PA-C  ondansetron (ZOFRAN) 4 MG tablet Take 1 tablet (4 mg total) by mouth every 8 (eight) hours as needed for nausea. 08/06/13   Hilton Sinclair, MD  triamcinolone cream (KENALOG) 0.5 % APPLY TOPICALLY TO ITCHY AREAS TWICE DAILY AS NEEDED FOR ITCHING UNTIL 2 TO 3 DAYS AFTER HEALED; Order number [id number] 09/15/14   Hilton Sinclair, MD   BP 132/67 mmHg  Pulse 70  Temp(Src) 97.8 F (36.6 C) (Oral)  Resp 20  Ht 5\' 2"  (1.575 m)  Wt 130 lb (58.968 kg)  BMI 23.77 kg/m2  SpO2 97% Physical Exam  Constitutional: She is oriented to person, place, and time. She appears well-developed.  HENT:  Head: Normocephalic and atraumatic.  Eyes: EOM are normal.  Neck: Normal range of motion. Neck supple.  Cardiovascular: Normal rate.   Murmur heard. Pulmonary/Chest: Effort normal.  Abdominal: Bowel sounds are normal.  Neurological: She is alert and oriented to person, place, and time.  Skin: Skin is warm and dry.  Nursing note and vitals reviewed.   ED Course  Procedures (including critical care time) Labs Review Labs Reviewed  BASIC METABOLIC PANEL - Abnormal; Notable for the following:    Glucose, Bld 182 (*)    All other components within normal limits  CBC  I-STAT TROPOININ, ED  I-STAT TROPOININ, ED    Imaging Review Dg Chest 2 View  09/30/2015  CLINICAL DATA:  Chest pain which developed wall interrogating pacemaker  today, gets shortness of breath, nausea, vomiting and diaphoresis each time it is interrogated, persistent substernal chest pain nonradiating, history diabetes mellitus, hypertension, COPD EXAM: CHEST  2 VIEW COMPARISON:  06/07/2015 FINDINGS: LEFT subclavian transvenous pacemaker leads project over RIGHT atrium and RIGHT ventricle. Additional RIGHT subclavian leads and infra diaphragmatic lead are also again noted. Normal heart size, mediastinal contours, and pulmonary vascularity. Atherosclerotic calcification aorta. Lungs clear. No pleural effusion or pneumothorax. Bones unremarkable. IMPRESSION: No acute abnormalities. Electronically Signed   By: Lavonia Dana M.D.   On: 09/30/2015 18:23   I have personally reviewed and evaluated these images and lab results as part of my medical decision-making.   EKG Interpretation   Date/Time:  Wednesday September 30 2015 17:45:45 EST Ventricular Rate:  74 PR Interval:  168 QRS Duration: 125 QT Interval:  465 QTC Calculation: 516 R Axis:   123 Text Interpretation:  A-V dual-paced rhythm with some inhibition No  further analysis attempted due to paced rhythm paced as previos ekg -   unavhanged Confirmed by Kathrynn Humble, MD, Thelma Comp [phone]) on 09/30/2015 5:51:07  PM      MDM   Final diagnoses:  Chest pain, unspecified chest pain type  Encounter for assessment of automatic implantable cardioverter-defibrillator (AICD)    Pt comes in with cc of atypical chest pain, which started when she started her device interrogation. She is now symptom free. EKG shows proper pacing. We wanted her device interrogated - but pt refused after the RN started, citing similar discomfort.  She has no known CAD. Reports no chest pain, dib with exertion. Pt prefers seeing Cards in the clinic for the interrogation, and has agreed to stay in the ER for 2 trops.     Varney Biles, MD 10/02/15 [phone]

## 2015-09-30 NOTE — Telephone Encounter (Signed)
Pt called in and stated that she was using her home monitor and she passed out. Pt was convinced that the home monitor made her pass out. Informed pt that the home monitor doesn't change any settings on her device it just looks at the device to make sure everything is functioning pro[perly. When I asked how pt was feeling she said she was having chest pain. Per Debroah Loop, RN advised pt to go to the ER. Pt informed me that she would call 911.

## 2015-09-30 NOTE — Discharge Instructions (Signed)
We saw you in the ER for the chest pain/shortness of breath. All of our cardiac workup is normal, including labs, EKG and chest X-RAY are normal. We are not sure what is causing your discomfort, but we feel comfortable sending you home at this time. The workup in the ER is not complete, and you should follow up with your primary care doctor for further evaluation.  Please return to the ER if you have worsening chest pain, shortness of breath, pain radiating to your jaw, shoulder, or back, sweats or fainting. Otherwise see the Cardiologist or your primary care doctor as requested.    Nonspecific Chest Pain  Chest pain can be caused by many different conditions. There is always a chance that your pain could be related to something serious, such as a heart attack or a blood clot in your lungs. Chest pain can also be caused by conditions that are not life-threatening. If you have chest pain, it is very important to follow up with your health care provider. CAUSES  Chest pain can be caused by:  Heartburn.  Pneumonia or bronchitis.  Anxiety or stress.  Inflammation around your heart (pericarditis) or lung (pleuritis or pleurisy).  A blood clot in your lung.  A collapsed lung (pneumothorax). It can develop suddenly on its own (spontaneous pneumothorax) or from trauma to the chest.  Shingles infection (varicella-zoster virus).  Heart attack.  Damage to the bones, muscles, and cartilage that make up your chest wall. This can include:  Bruised bones due to injury.  Strained muscles or cartilage due to frequent or repeated coughing or overwork.  Fracture to one or more ribs.  Sore cartilage due to inflammation (costochondritis). RISK FACTORS  Risk factors for chest pain may include:  Activities that increase your risk for trauma or injury to your chest.  Respiratory infections or conditions that cause frequent coughing.  Medical conditions or overeating that can cause  heartburn.  Heart disease or family history of heart disease.  Conditions or health behaviors that increase your risk of developing a blood clot.  Having had chicken pox (varicella zoster). SIGNS AND SYMPTOMS Chest pain can feel like:  Burning or tingling on the surface of your chest or deep in your chest.  Crushing, pressure, aching, or squeezing pain.  Dull or sharp pain that is worse when you move, cough, or take a deep breath.  Pain that is also felt in your back, neck, shoulder, or arm, or pain that spreads to any of these areas. Your chest pain may come and go, or it may stay constant. DIAGNOSIS Lab tests or other studies may be needed to find the cause of your pain. Your health care provider may have you take a test called an ambulatory ECG (electrocardiogram). An ECG records your heartbeat patterns at the time the test is performed. You may also have other tests, such as:  Transthoracic echocardiogram (TTE). During echocardiography, sound waves are used to create a picture of all of the heart structures and to look at how blood flows through your heart.  Transesophageal echocardiogram (TEE).This is a more advanced imaging test that obtains images from inside your body. It allows your health care provider to see your heart in finer detail.  Cardiac monitoring. This allows your health care provider to monitor your heart rate and rhythm in real time.  Holter monitor. This is a portable device that records your heartbeat and can help to diagnose abnormal heartbeats. It allows your health care provider to track  your heart activity for several days, if needed.  Stress tests. These can be done through exercise or by taking medicine that makes your heart beat more quickly.  Blood tests.  Imaging tests. TREATMENT  Your treatment depends on what is causing your chest pain. Treatment may include:  Medicines. These may include:  Acid blockers for heartburn.  Anti-inflammatory  medicine.  Pain medicine for inflammatory conditions.  Antibiotic medicine, if an infection is present.  Medicines to dissolve blood clots.  Medicines to treat coronary artery disease.  Supportive care for conditions that do not require medicines. This may include:  Resting.  Applying heat or cold packs to injured areas.  Limiting activities until pain decreases. HOME CARE INSTRUCTIONS  If you were prescribed an antibiotic medicine, finish it all even if you start to feel better.  Avoid any activities that bring on chest pain.  Do not use any tobacco products, including cigarettes, chewing tobacco, or electronic cigarettes. If you need help quitting, ask your health care provider.  Do not drink alcohol.  Take medicines only as directed by your health care provider.  Keep all follow-up visits as directed by your health care provider. This is important. This includes any further testing if your chest pain does not go away.  If heartburn is the cause for your chest pain, you may be told to keep your head raised (elevated) while sleeping. This reduces the chance that acid will go from your stomach into your esophagus.  Make lifestyle changes as directed by your health care provider. These may include:  Getting regular exercise. Ask your health care provider to suggest some activities that are safe for you.  Eating a heart-healthy diet. A registered dietitian can help you to learn healthy eating options.  Maintaining a healthy weight.  Managing diabetes, if necessary.  Reducing stress. SEEK MEDICAL CARE IF:  Your chest pain does not go away after treatment.  You have a rash with blisters on your chest.  You have a fever. SEEK IMMEDIATE MEDICAL CARE IF:   Your chest pain is worse.  You have an increasing cough, or you cough up blood.  You have severe abdominal pain.  You have severe weakness.  You faint.  You have chills.  You have sudden, unexplained chest  discomfort.  You have sudden, unexplained discomfort in your arms, back, neck, or jaw.  You have shortness of breath at any time.  You suddenly start to sweat, or your skin gets clammy.  You feel nauseous or you vomit.  You suddenly feel light-headed or dizzy.  Your heart begins to beat quickly, or it feels like it is skipping beats. These symptoms may represent a serious problem that is an emergency. Do not wait to see if the symptoms will go away. Get medical help right away. Call your local emergency services (911 in the [place] Do not drive yourself to the hospital.   This information is not intended to replace advice given to you by your health care provider. Make sure you discuss any questions you have with your health care provider.   Document Released: 05/04/2005 Document Revised: 08/15/2014 Document Reviewed: 02/28/2014 Elsevier Interactive Patient Education [employer].

## 2015-10-16 DIAGNOSIS — E119 Type 2 diabetes mellitus without complications: Secondary | ICD-10-CM | POA: Diagnosis not present

## 2015-10-23 ENCOUNTER — Other Ambulatory Visit: Payer: Self-pay | Admitting: Family Medicine

## 2015-10-26 ENCOUNTER — Other Ambulatory Visit: Payer: Self-pay | Admitting: Family Medicine

## 2015-10-26 NOTE — Telephone Encounter (Signed)
Pt informed and appt made. Fleeger, Jessica Dawn, CMA  

## 2015-10-27 ENCOUNTER — Other Ambulatory Visit: Payer: Self-pay | Admitting: *Deleted

## 2015-10-27 MED ORDER — ROSUVASTATIN CALCIUM 20 MG PO TABS: ORAL_TABLET | ORAL | Status: DC

## 2015-11-04 ENCOUNTER — Emergency Department (INDEPENDENT_AMBULATORY_CARE_PROVIDER_SITE_OTHER)
Admission: EM | Admit: 2015-11-04 | Discharge: 2015-11-04 | Disposition: A | Discharge status: Home / Self Care | Payer: Medicare Other | Source: Home / Self Care | Attending: Family Medicine | Admitting: Family Medicine

## 2015-11-04 ENCOUNTER — Encounter (HOSPITAL_COMMUNITY): Payer: Self-pay | Admitting: *Deleted

## 2015-11-04 DIAGNOSIS — T148XXA Other injury of unspecified body region, initial encounter: Secondary | ICD-10-CM

## 2015-11-04 DIAGNOSIS — T1490 Injury, unspecified: Secondary | ICD-10-CM

## 2015-11-04 MED ORDER — POVIDONE-IODINE 10 % EX SOLN
CUTANEOUS | Status: AC
  Filled 2015-11-04: qty 118

## 2015-11-04 NOTE — ED Provider Notes (Signed)
CSN: [id number]     Arrival date & time 11/04/15  1301 History   First MD Initiated Contact with Patient 11/04/15 1306     Chief Complaint  Patient presents with  . Finger Injury   (Consider location/radiation/quality/duration/timing/severity/associated sxs/prior Treatment) Patient is a 70 y.o. female presenting with hand pain. The history is provided by the patient.  Hand Pain This is a new problem. The current episode started more than 2 days ago (caught  fingertip in hedgeclipper sustaining blister to skin.). The problem has not changed since onset.Pertinent negatives include no chest pain, no abdominal pain and no headaches.    Past Medical History  Diagnosis Date  . Diabetes mellitus   . Hypertension   . Hypothyroidism   . COPD (chronic obstructive pulmonary disease) ([place])   . Heart murmur   . GERD (gastroesophageal reflux disease)   . 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 Leggett.  OFFICE NOTE DR. A. LITTLE STATES  "EXTREMELY PACEMAKER SENSITIVE AND WHEN YOU TRY TO CHECK FOR UNDERLYING RHYTHMS SHE WILL HAVE SNYCOPE AND WE HAVE NOT DONE THIS NOW IN ABOUT 2 YEARS".  . Normal coronary arteries 2011  . Complete heart block ([place])   . Coronary artery disease   . Shortness of breath dyspnea     walking distance or climbing stairs  . Asthma    Past Surgical History  Procedure Laterality Date  . Pacemaker insertion  1988    last gen 11/08- MDT  . Tubal ligation    . Cardiac catheterization  2011     SHOWED NO CAD-PER CARDIOLOGY OFFICE NOTES DR. A. LITTLE  . Atrioventricular cushion defect repair  1988  . Total knee arthroplasty Right 11/19/2012    Procedure: RIGHT TOTAL KNEE ARTHROPLASTY;  Surgeon: Gearlean Alf, MD;  Location: [facility];  Service: Orthopedics;  Laterality: Right;  . Insert / replace / remove pacemaker    . Colonscopy       removed polyps   . Total knee revision Right 09/24/2014    Procedure:  RIGHT TOTAL KNEE ARTHROPLASTY REVISION;  Surgeon: Gearlean Alf, MD;  Location: [facility];  Service: Orthopedics;  Laterality: Right;   Family History  Problem Relation Age of Onset  . Heart failure Mother   . Heart failure Sister    Social History  Substance Use Topics  . Smoking status: Never Smoker   . Smokeless tobacco: Never Used  . Alcohol Use: No   OB History    No data available     Review of Systems  Constitutional: Negative.   Cardiovascular: Negative for chest pain.  Gastrointestinal: Negative for abdominal pain.  Musculoskeletal: Negative.   Skin: Positive for wound.  Neurological: Negative for headaches.  All other systems reviewed and are negative.   Allergies  Chlorhexidine; Naproxen; Pregabalin; Propoxyphene n-acetaminophen; Statins; Amitriptyline hcl; Benazepril hcl; Gabapentin; and Tylox  Home Medications   Prior to Admission medications   Medication Sig Start Date End Date Taking? Authorizing Provider  albuterol [facility] HFA) 108 (90 Base) MCG/ACT inhaler Use 2 puffs every 4 hours  as needed for wheezing 09/02/15   Olam Idler, MD  albuterol (PROVENTIL) (2.5 MG/3ML) 0.083% nebulizer solution TAKE 1 VIAL (3 MLS) BY  NEBULIZATION EVERY 4 HOURS  AS NEEDED FOR WHEEZING 10/27/15   Olam Idler, MD  aspirin 81 MG tablet Take 81 mg by mouth daily.    Historical  Provider, MD  citalopram (CELEXA) 20 MG tablet Take 1 tablet (20 mg total) by mouth every morning. Order number [id number] 04/27/15   Olam Idler, MD  EPIPEN 2-PAK 0.3 MG/0.3ML SOAJ injection Inject 0.3 mLs (0.3 mg  total) into the muscle as  needed for severe allergic  reaction, and contact  medical provider. 10/27/15   Olam Idler, MD  fluticasone (FLOVENT HFA) 110 MCG/ACT inhaler Inhale 1 puff into the lungs 2 (two) times daily. 11/05/13   Hilton Sinclair, MD  furosemide (LASIX) 20 MG tablet Take 1 tablet by mouth  every morning 09/02/15   Olam Idler, MD  glipiZIDE (GLUCOTROL XL) 10 MG 24 hr  tablet Take 1 tablet by mouth  daily with breakfast 10/23/15   Olam Idler, MD  glucose blood test strip Use for blood sugar testing 1-3 times daily. 09/15/14   Hilton Sinclair, MD  HYDROcodone-acetaminophen (NORCO/VICODIN) 5-325 MG per tablet Take 1-2 tablets by mouth every 4 (four) hours as needed for moderate pain. 09/27/14   Danae Orleans, PA-C  Lancets MISC Use with blood sugar monitor. 02/03/14   Hilton Sinclair, MD  levothyroxine (SYNTHROID, LEVOTHROID) 75 MCG tablet Take 1 tablet (75 mcg total) by mouth daily before breakfast. Order number [id number]. Needs thyroid check. 08/19/14   Hilton Sinclair, MD  losartan (COZAAR) 25 MG tablet Take 1 tablet (25 mg total) by mouth daily. 09/02/15   Olam Idler, MD  metFORMIN (GLUCOPHAGE) 500 MG tablet Take 2 tablets (1,000 mg total) by mouth 2 (two) times daily with a meal. 04/27/15   Olam Idler, MD  methocarbamol (ROBAXIN) 500 MG tablet Take 1 tablet (500 mg total) by mouth every 6 (six) hours as needed for muscle spasms. Patient not taking: Reported on 07/14/2015 09/27/14   Danae Orleans, PA-C  Omega-3 Fatty Acids (FISH OIL) 1000 MG CAPS Take 1 capsule by mouth every morning.    Historical Provider, MD  omeprazole (PRILOSEC) 20 MG capsule TAKE 1 CAPSULE BY MOUTH TWICE DAILY 09/02/15   Olam Idler, MD  ondansetron (ZOFRAN) 4 MG tablet Take 1 tablet (4 mg total) by mouth every 8 (eight) hours as needed for nausea. 08/06/13   Hilton Sinclair, MD  polyethylene glycol powder (GLYCOLAX/MIRALAX) powder TAKE 17 G BY MOUTH 2 (TWO) TIMES DAILY. Patient taking differently: TAKE 17 G BY MOUTH 2 (TWO) TIMES DAILY AS NEEDED FOR LAXATIVE 09/02/15   Olam Idler, MD  potassium chloride SA (K-DUR,KLOR-CON) 20 MEQ tablet Take 1 tablet (20 mEq total) by mouth daily. 09/02/15   Olam Idler, MD  propranolol (INDERAL) 40 MG tablet Take 1 tablet (40 mg total) by mouth daily. 09/02/15   Olam Idler, MD  rosuvastatin (CRESTOR) 20 MG tablet Take 1  tablet by mouth  daily 10/27/15   Olam Idler, MD  sitaGLIPtin (JANUVIA) 100 MG tablet Take 1 tablet by mouth  every morning 04/27/15   Olam Idler, MD  Travoprost, BAK Free, (TRAVATAN) 0.004 % SOLN ophthalmic solution Place 1 drop into both eyes at bedtime. 09/15/14   Hilton Sinclair, MD  traZODone (DESYREL) 50 MG tablet Take 0.5 tablets (25 mg total) by mouth at bedtime. 09/10/14   Coral Spikes, DO  triamcinolone cream (KENALOG) 0.5 % APPLY TOPICALLY TO ITCHY AREAS TWICE DAILY AS NEEDED FOR ITCHING UNTIL 2 TO 3 DAYS AFTER HEALED; Order number [id number] 09/15/14   Hilton Sinclair, MD   Meds Ordered and Administered this  Visit  Medications - No data to display  BP 144/58 mmHg  Pulse 78  Temp(Src) 98 F (36.7 C) (Oral)  Resp 14  SpO2 99% No data found.   Physical Exam  Constitutional: She is oriented to person, place, and time. She appears well-developed and well-nourished. No distress.  Musculoskeletal: Normal range of motion.  Neurological: She is alert and oriented to person, place, and time.  Skin: Skin is warm and dry.  Blood blister to distal phalanx volar skin ,no infection, is tender to palp.  Nursing note and vitals reviewed.   ED Course  .Marland KitchenIncision and Drainage Date/Time: 11/04/2015 1:46 PM Performed by: Billy Fischer Authorized by: Ihor Gully D Consent: Verbal consent obtained. Consent given by: patient Type: hematoma Body area: upper extremity Location details: left ring finger Patient sedated: no Risk factor: underlying major vessel Incision type: elliptical Incision depth: dermal Complexity: simple Drainage: bloody Drainage amount: moderate Wound treatment: wound left open Patient tolerance: Patient tolerated the procedure well with no immediate complications   (including critical care time)  Labs Review Labs Reviewed - No data to display  Imaging Review No results found.   Visual Acuity Review  Right Eye Distance:   Left Eye  Distance:   Bilateral Distance:    Right Eye Near:   Left Eye Near:    Bilateral Near:         MDM  No diagnosis found. Tetanus within 5 yrs.   Billy Fischer, MD 11/04/15 1351

## 2015-11-04 NOTE — ED Notes (Signed)
Pt  Reports    Sustained  A   Laceration   To   l  Middle  Finger    Pt  Has  A  bandaid  On the  Affected  Finger       She reports  She  Injured  The  Finger  sev  Days  Ago      Tetanus  Shot  Within    5  Years

## 2015-11-04 NOTE — Discharge Instructions (Signed)
Wash and cover as needed. Return if any problems.

## 2015-11-17 ENCOUNTER — Ambulatory Visit: Payer: Medicare Other | Admitting: Family Medicine

## 2015-12-21 DIAGNOSIS — H401132 Primary open-angle glaucoma, bilateral, moderate stage: Secondary | ICD-10-CM | POA: Diagnosis not present

## 2015-12-22 DIAGNOSIS — Z952 Presence of prosthetic heart valve: Secondary | ICD-10-CM | POA: Diagnosis not present

## 2015-12-22 DIAGNOSIS — Z950 Presence of cardiac pacemaker: Secondary | ICD-10-CM | POA: Diagnosis not present

## 2015-12-22 DIAGNOSIS — Z86010 Personal history of colonic polyps: Secondary | ICD-10-CM | POA: Diagnosis not present

## 2015-12-29 ENCOUNTER — Ambulatory Visit (INDEPENDENT_AMBULATORY_CARE_PROVIDER_SITE_OTHER): Payer: Medicare Other | Admitting: Cardiovascular Disease

## 2015-12-29 ENCOUNTER — Encounter: Payer: Self-pay | Admitting: Cardiovascular Disease

## 2015-12-29 VITALS — BP 111/77 | HR 79 | Ht 62.0 in | Wt 138.2 lb

## 2015-12-29 DIAGNOSIS — I5030 Unspecified diastolic (congestive) heart failure: Secondary | ICD-10-CM

## 2015-12-29 DIAGNOSIS — E785 Hyperlipidemia, unspecified: Secondary | ICD-10-CM | POA: Diagnosis not present

## 2015-12-29 DIAGNOSIS — E119 Type 2 diabetes mellitus without complications: Secondary | ICD-10-CM

## 2015-12-29 DIAGNOSIS — I442 Atrioventricular block, complete: Secondary | ICD-10-CM | POA: Diagnosis not present

## 2015-12-29 DIAGNOSIS — I10 Essential (primary) hypertension: Secondary | ICD-10-CM

## 2015-12-29 DIAGNOSIS — Z950 Presence of cardiac pacemaker: Secondary | ICD-10-CM | POA: Diagnosis not present

## 2015-12-29 DIAGNOSIS — Z79899 Other long term (current) drug therapy: Secondary | ICD-10-CM

## 2015-12-29 DIAGNOSIS — J42 Unspecified chronic bronchitis: Secondary | ICD-10-CM

## 2015-12-29 LAB — CUP PACEART INCLINIC DEVICE CHECK
Date Time Interrogation Session: 20170523092212
Implantable Lead Location: 753860
Implantable Lead Model: 5092
Lead Channel Setting Pacing Amplitude: 2.5 V
MDC IDC LEAD IMPLANT DT: 19990203
MDC IDC LEAD IMPLANT DT: 19990203
MDC IDC LEAD LOCATION: 753859
MDC IDC SET LEADCHNL RA PACING AMPLITUDE: 2 V
MDC IDC SET LEADCHNL RV PACING PULSEWIDTH: 0.4 ms
MDC IDC SET LEADCHNL RV SENSING SENSITIVITY: 2.8 mV

## 2015-12-29 NOTE — Patient Instructions (Signed)
Your physician recommends that you continue on your current medications as directed. Please refer to the Current Medication list given to you today.  Dr Sallyanne Kuster recommends that you schedule a follow-up appointment in 3 months with a pacemaker check.  If you need a refill on your cardiac medications before your next appointment, please call your pharmacy.

## 2015-12-29 NOTE — Progress Notes (Signed)
Patient ID: Mercedes Barr, female   DOB: Apr 23, 1946, 70 y.o.   MRN: [id number]    Cardiology Office Note    Date:  12/29/2015   ID:  Mercedes Barr, DOB 1946/08/03, MRN [id number]  PCP:  Phill Myron, MD  Cardiologist:   Sanda Klein, MD   Chief Complaint  Patient presents with  . Follow-up    History of Present Illness:  Mercedes Barr is a 70 y.o. female with complete heart block, pacemaker dependent, history of endocardial cushion defect repair, heart failure with preserved left ventricular systolic function.  She is doing well, without complaints of dyspnea or edema. If she tries to skip her diuretic she will feel some chest fullness and shortness of breath after a couple of days. She is well compensated on a very low dose of furosemide. She denies orthopnea or PND and has not had palpitations, syncope, chest discomfort on exertion, neurological complaints. As always when we interrogate her pacemaker she has a brief sensation of near syncope that she finds very disturbing.  She reports good glycemic control.  Pacemaker interrogation shows roughly 18 months of generator longevity (range 4-32 months). There are 75% atrial pacing with good heart rate histogram distribution and 100% ventricular pacing. A 2 very brief episodes of mode switch have been recorded. There is no evidence of atrial fibrillation.  She had surgical repair of atrioventricular cushion defect repair in 1988 when she was 70 years old. Surgery was complicated by development of complete heart block and she received a pacemaker (her initial device probably was an abdominal generator with epicardial leads). A right subclavian pacemaker was placed in 1991, but had to be removed because of complaints of pain. She had a complete revision of the system in 1999 and her current left subclavian atrial and ventricular leads date to that time. Her most recent generator change out occurred in 2008. The atrial lead is a Medtronic [id number].  The ventricular lead is Medtronic [id number]. The current generator is a Medtronic Adapta.  Past Medical History  Diagnosis Date  . Diabetes mellitus   . Hypertension   . Hypothyroidism   . COPD (chronic obstructive pulmonary disease) ([place])   . Heart murmur   . GERD (gastroesophageal reflux disease)   . 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 Greencastle.  OFFICE NOTE DR. A. LITTLE STATES  "EXTREMELY PACEMAKER SENSITIVE AND WHEN YOU TRY TO CHECK FOR UNDERLYING RHYTHMS SHE WILL HAVE SNYCOPE AND WE HAVE NOT DONE THIS NOW IN ABOUT 2 YEARS".  . Normal coronary arteries 2011  . Complete heart block ([place])   . Coronary artery disease   . Shortness of breath dyspnea     walking distance or climbing stairs  . Asthma     Past Surgical History  Procedure Laterality Date  . Pacemaker insertion  1988    last gen 11/08- MDT  . Tubal ligation    . Cardiac catheterization  2011     SHOWED NO CAD-PER CARDIOLOGY OFFICE NOTES DR. A. LITTLE  . Atrioventricular cushion defect repair  1988  . Total knee arthroplasty Right 11/19/2012    Procedure: RIGHT TOTAL KNEE ARTHROPLASTY;  Surgeon: Gearlean Alf, MD;  Location: [facility];  Service: Orthopedics;  Laterality: Right;  . Insert / replace / remove pacemaker    . Colonscopy       removed polyps   . Total knee revision Right  09/24/2014    Procedure: RIGHT TOTAL KNEE ARTHROPLASTY REVISION;  Surgeon: Gearlean Alf, MD;  Location: [facility];  Service: Orthopedics;  Laterality: Right;    Current Medications: Outpatient Prescriptions Prior to Visit  Medication Sig Dispense Refill  . albuterol (PROAIR HFA) 108 (90 Base) MCG/ACT inhaler Use 2 puffs every 4 hours  as needed for wheezing 25.5 g 3  . albuterol (PROVENTIL) (2.5 MG/3ML) 0.083% nebulizer solution TAKE 1 VIAL (3 MLS) BY  NEBULIZATION EVERY 4 HOURS  AS NEEDED FOR WHEEZING 36 mL 1  . aspirin 81 MG tablet Take 81 mg by mouth daily.    .  citalopram (CELEXA) 20 MG tablet Take 1 tablet (20 mg total) by mouth every morning. Order number [id number] 30 tablet 5  . EPIPEN 2-PAK 0.3 MG/0.3ML SOAJ injection Inject 0.3 mLs (0.3 mg  total) into the muscle as  needed for severe allergic  reaction, and contact  medical provider. 2 Device 0  . fluticasone (FLOVENT HFA) 110 MCG/ACT inhaler Inhale 1 puff into the lungs 2 (two) times daily. 1 Inhaler 5  . furosemide (LASIX) 20 MG tablet Take 1 tablet by mouth  every morning 90 tablet 3  . glipiZIDE (GLUCOTROL XL) 10 MG 24 hr tablet Take 1 tablet by mouth  daily with breakfast 90 tablet 1  . glucose blood test strip Use for blood sugar testing 1-3 times daily. 100 each 11  . HYDROcodone-acetaminophen (NORCO/VICODIN) 5-325 MG per tablet Take 1-2 tablets by mouth every 4 (four) hours as needed for moderate pain. 100 tablet 0  . Lancets MISC Use with blood sugar monitor. 100 each 11  . levothyroxine (SYNTHROID, LEVOTHROID) 75 MCG tablet Take 1 tablet (75 mcg total) by mouth daily before breakfast. Order number [id number]. Needs thyroid check. 30 tablet 2  . losartan (COZAAR) 25 MG tablet Take 1 tablet (25 mg total) by mouth daily. 90 tablet 3  . metFORMIN (GLUCOPHAGE) 500 MG tablet Take 2 tablets (1,000 mg total) by mouth 2 (two) times daily with a meal. 360 tablet 3  . methocarbamol (ROBAXIN) 500 MG tablet Take 1 tablet (500 mg total) by mouth every 6 (six) hours as needed for muscle spasms. 80 tablet 0  . Omega-3 Fatty Acids (FISH OIL) 1000 MG CAPS Take 1 capsule by mouth every morning.    Marland Kitchen omeprazole (PRILOSEC) 20 MG capsule TAKE 1 CAPSULE BY MOUTH TWICE DAILY 180 capsule 1  . ondansetron (ZOFRAN) 4 MG tablet Take 1 tablet (4 mg total) by mouth every 8 (eight) hours as needed for nausea. 20 tablet 0  . polyethylene glycol powder (GLYCOLAX/MIRALAX) powder TAKE 17 G BY MOUTH 2 (TWO) TIMES DAILY. (Patient taking differently: TAKE 17 G BY MOUTH 2 (TWO) TIMES DAILY AS NEEDED FOR LAXATIVE) 1054 g 0  .  potassium chloride SA (K-DUR,KLOR-CON) 20 MEQ tablet Take 1 tablet (20 mEq total) by mouth daily. 90 tablet 1  . propranolol (INDERAL) 40 MG tablet Take 1 tablet (40 mg total) by mouth daily. 90 tablet 1  . rosuvastatin (CRESTOR) 20 MG tablet Take 1 tablet by mouth  daily 90 tablet 1  . sitaGLIPtin (JANUVIA) 100 MG tablet Take 1 tablet by mouth  every morning 90 tablet 3  . Travoprost, BAK Free, (TRAVATAN) 0.004 % SOLN ophthalmic solution Place 1 drop into both eyes at bedtime. 1 Bottle 5  . traZODone (DESYREL) 50 MG tablet Take 0.5 tablets (25 mg total) by mouth at bedtime. 30 tablet 3  . triamcinolone cream (KENALOG) 0.5 %  APPLY TOPICALLY TO ITCHY AREAS TWICE DAILY AS NEEDED FOR ITCHING UNTIL 2 TO 3 DAYS AFTER HEALED; Order number [id number] 15 g 0   No facility-administered medications prior to visit.     Allergies:   Chlorhexidine; Naproxen; Pregabalin; Propoxyphene n-acetaminophen; Statins; Amitriptyline; Benazepril; Propoxyphene; Amitriptyline hcl; Benazepril hcl; Gabapentin; and Tylox   Social History   Social History  . Marital Status: Divorced    Spouse Name: N/A  . Number of Children: N/A  . Years of Education: N/A   Social History Main Topics  . Smoking status: Never Smoker   . Smokeless tobacco: Never Used  . Alcohol Use: No  . Drug Use: No  . Sexual Activity: Not Asked   Other Topics Concern  . None   Social History Narrative     Family History:  The patient's family history includes Heart failure in her mother and sister.   ROS:   Please see the history of present illness.    ROS All other systems reviewed and are negative.   PHYSICAL EXAM:   VS:  BP 111/77 mmHg  Pulse 79  Ht 5\' 2"  (1.575 m)  Wt 62.687 kg (138 lb 3.2 oz)  BMI 25.27 kg/m2  SpO2 99%   GEN: Well nourished, well developed, in no acute distress HEENT: normal Neck: no JVD, carotid bruits, or masses Cardiac: Paradoxically split S2, RRR; no murmurs, rubs, or gallops,no edema , healthy left  subclavian pacemaker site Respiratory:  clear to auscultation bilaterally, normal work of breathing GI: soft, nontender, nondistended, + BS MS: no deformity or atrophy Skin: warm and dry, no rash Neuro:  Alert and Oriented x 3, Strength and sensation are intact Psych: euthymic mood, full affect  Wt Readings from Last 3 Encounters:  12/29/15 62.687 kg (138 lb 3.2 oz)  09/30/15 58.968 kg (130 lb)  07/14/15 59.875 kg (132 lb)      Studies/Labs Reviewed:   EKG:  EKG is not ordered today.  The ekg ordered today demonstrates A sensed, V paced rhythm  Recent Labs: 04/27/2015: TSH 0.696 09/30/2015: BUN 11; Creatinine, Ser 0.77; Hemoglobin 12.2; Platelets 218; Potassium 3.6; Sodium 141   Lipid Panel    Component Value Date/Time   CHOL 197 08/18/2014 1157   TRIG 202* 08/18/2014 1157   HDL 35* 08/18/2014 1157   CHOLHDL 5.6 08/18/2014 1157   VLDL 40 08/18/2014 1157   LDLCALC 122* 08/18/2014 1157   LDLDIRECT 56 06/08/2012 1120     ASSESSMENT:    1. (HFpEF) heart failure with preserved ejection fraction ([place])   2. Complete heart block ([place])   3. Pacemaker- last gen change was a MDT Nov 2008   4. Hyperlipidemia   5. Type 2 diabetes mellitus without complication, without long-term current use of insulin ([place])   6. Essential hypertension   7. Chronic bronchitis, unspecified chronic bronchitis type ([place])   8. Medication management      PLAN:  In order of problems listed above:  1. CHF: Well compensated on a very low dose of loop diuretic. Excellent blood pressure. Appears euvolemic by clinical exam. NYHA functional class I-II, limited more by orthopedic problems then dyspnea. 2. CHB: She is pacemaker dependent and very sensitive to pacemaker interrogation. She feels very poorly even when she does downloads from home. Prefers to have the device checked in the office every 3 months. 3. PPM: Normal device function. Pacemaker dependent. Office checks every 3 months. 4. HLP: She does  not have coronary/vascular disease but does have diabetes  mellitus. Target LDL less than 100. Recheck before next office visit. 5. DM: Borderline control, last hemoglobin A1c 7.1% in December. She is relatively lean. 6. HTN: Good blood pressure control. 7. She has never smoked    Medication Adjustments/Labs and Tests Ordered: Current medicines are reviewed at length with the patient today.  Concerns regarding medicines are outlined above.  Medication changes, Labs and Tests ordered today are listed in the Patient Instructions below. Patient Instructions  Your physician recommends that you continue on your current medications as directed. Please refer to the Current Medication list given to you today.  Dr Sallyanne Kuster recommends that you schedule a follow-up appointment in 3 months with a pacemaker check.  If you need a refill on your cardiac medications before your next appointment, please call your pharmacy.     Signed, Sanda Klein, MD  12/29/2015 9:33 AM    [facility] [address], [place], [place]  [address] Phone: [phone]; Fax: [phone]

## 2015-12-31 ENCOUNTER — Other Ambulatory Visit: Payer: Self-pay | Admitting: Family Medicine

## 2016-01-05 ENCOUNTER — Other Ambulatory Visit: Payer: Self-pay | Admitting: *Deleted

## 2016-01-05 DIAGNOSIS — Z86010 Personal history of colonic polyps: Secondary | ICD-10-CM | POA: Diagnosis not present

## 2016-01-06 ENCOUNTER — Telehealth: Payer: Self-pay | Admitting: Cardiovascular Disease

## 2016-01-06 MED ORDER — POLYETHYLENE GLYCOL 3350 17 GM/SCOOP PO POWD: ORAL | Status: DC

## 2016-01-06 NOTE — Telephone Encounter (Signed)
Pt got letter to get labs and pt wants to know why? Pls call [phone]

## 2016-01-06 NOTE — Telephone Encounter (Signed)
Pt got lab slips in the mail, but does not know when to go for tests. Not clear from notes when she is supposed to return for bloodwork. Please advise.

## 2016-01-06 NOTE — Telephone Encounter (Signed)
Patient needs fasting labs done prior to next office visit. Returned call to patient. Patient will have labs done in August.

## 2016-01-15 ENCOUNTER — Encounter: Payer: Self-pay | Admitting: Cardiovascular Disease

## 2016-01-18 DIAGNOSIS — E119 Type 2 diabetes mellitus without complications: Secondary | ICD-10-CM | POA: Diagnosis not present

## 2016-01-22 ENCOUNTER — Telehealth: Payer: Self-pay | Admitting: *Deleted

## 2016-01-22 NOTE — Telephone Encounter (Signed)
Patient called to schedule an appt for a follow up on her DM but there are no follow up appts left for pcp.  Will check with MD to see if I can possibly schedule her in a same day appt so she can see PCP before he leaves. Reynol Arnone,CMA

## 2016-01-25 NOTE — Telephone Encounter (Signed)
LM for patient to call back for an appt with pcp. Sid Greener,CMA

## 2016-01-25 NOTE — Telephone Encounter (Signed)
Sure, sameday slot works for me.

## 2016-01-26 NOTE — Telephone Encounter (Signed)
Patient is scheduled for 01-28-16. Tru Leopard,CMA

## 2016-01-28 ENCOUNTER — Ambulatory Visit (INDEPENDENT_AMBULATORY_CARE_PROVIDER_SITE_OTHER): Payer: Medicare Other | Admitting: Family Medicine

## 2016-01-28 ENCOUNTER — Encounter: Payer: Self-pay | Admitting: Family Medicine

## 2016-01-28 VITALS — BP 130/67 | HR 86 | Temp 98.2°F | Wt 137.0 lb

## 2016-01-28 DIAGNOSIS — E119 Type 2 diabetes mellitus without complications: Secondary | ICD-10-CM | POA: Diagnosis not present

## 2016-01-28 DIAGNOSIS — M25561 Pain in right knee: Secondary | ICD-10-CM

## 2016-01-28 LAB — POCT GLYCOSYLATED HEMOGLOBIN (HGB A1C): HEMOGLOBIN A1C: 7.8

## 2016-01-28 NOTE — Patient Instructions (Signed)
Your A1c has increased some.  - Continue checking her blood sugar every morning prior to eating or taking medications.  Bring a log of your blood sugars to your appointment with Dr Valentina Lucks in 2-3 weeks - Continue to work on cutting back on sweets and carbohydrates - I have completed paperwork for your diabetic shoes; please let us know if you have not heard something the next 2-3 weeks  Right knee pain - I have referred you to physical therapy for strengthening and flexibility exercises for the right knee

## 2016-01-28 NOTE — Assessment & Plan Note (Signed)
Residual right knee pain status post knee replacement one year ago.  Possibly related to scar tissue and limited range of motion.  - Refer to physical therapy - Discontinue desipramine as she denies any benefit from this - She will follow-up in 3-4 weeks with new PCP

## 2016-01-28 NOTE — Progress Notes (Signed)
  Patient name: Mercedes Barr MRN [id number]  Date of birth: [birth year]  CC & HPI:  Mercedes Barr is a 70 y.o. female presenting today for Diabetes and right knee pain.   DIABETES   Blood Sugar Ranges: 130-200s  Symptoms of Hyperglycemia? no  Symptoms of Hypoglycemia? Yes- reports occasional low blood sugars when she skips breakfast  Comorbid Symptoms:   Chest pain: no; SOB: no   Neuropathy: no   Vision problems: Glaucoma and cataracts  Medication Compliance: yes   Medication Side Effects: Denies GI issues  Counseling  Diet pattern: Does admit to eating more fast food and sweets recently due to an inflammatory per family member    Right knee pain - She continues to endorse sharp anterior knee pain - Pain worse with standing and movement, especially after long periods of sitting - She is status post knee replacement 2, does report she was unable to complete physical therapy after last knee replacement due to transportation issues - Denies lower extremity numbness or weakness - Denies fever, chills, recent infections - She denies any improvement in pain with desipramine  Smoking History Noted  Objective Findings:  Vitals: BP 130/67 mmHg  Pulse 86  Temp(Src) 98.2 F (36.8 C) (Oral)  Wt 137 lb (62.143 kg)  Gen: NAD CV: RRR w/o m/r/g, pulses +2 b/l Resp: CTAB w/ normal respiratory effort Right Knee: Diffuse anterior knee tenderness; no erythema or swelling; knee flexion limited to 90 Foot Exam: Pulses 2+; No Ulcers, bruises or cuts; Calluses noted on plantar aspect of right 5th Metatarsal head; Monofilament testing: Sensation intact b/l   Assessment & Plan:   DM (diabetes mellitus), type 2 Diabetes with worsening control.  A1c increased to 7.8 , likely due to dietary indiscretions.  - She will work on limiting sweets and reducing carbohydrate intake.  - Check blood sugars every morning and bring to appointment with Dr. Valentina Lucks in 2-3 weeks.  CBGs remain elevated,  consider SGLT2 vs discontinuing glipizide and starting insulin - Due to plantar calluses on feet will recommend diabetic shoes  Right knee pain Residual right knee pain status post knee replacement one year ago.  Possibly related to scar tissue and limited range of motion.  - Refer to physical therapy - Discontinue desipramine as she denies any benefit from this - She will follow-up in 3-4 weeks with new PCP

## 2016-01-28 NOTE — Assessment & Plan Note (Addendum)
Diabetes with worsening control.  A1c increased to 7.8 , likely due to dietary indiscretions.  - She will work on limiting sweets and reducing carbohydrate intake.  - Check blood sugars every morning and bring to appointment with Dr. Valentina Lucks in 2-3 weeks.  CBGs remain elevated, consider SGLT2 vs discontinuing glipizide and starting insulin - Due to plantar calluses on feet will recommend diabetic shoes

## 2016-02-01 ENCOUNTER — Telehealth: Payer: Self-pay | Admitting: *Deleted

## 2016-02-01 NOTE — Telephone Encounter (Signed)
Patient states she was seen last week and was supposed to call back and give him the info regarding her diabetic shoes (states MD already has the forms):  [facility] [address] [place] [address] Ph: [phone] Hilda Blades (representative): [phone]

## 2016-02-02 NOTE — Telephone Encounter (Signed)
Called patient and informed her that I have completed the paperwork. She will let me know if they request anything else or if she hasn't received her DM shoes within the next 2 weeks.

## 2016-02-10 ENCOUNTER — Ambulatory Visit: Payer: Medicare Other | Attending: Family Medicine | Admitting: Physical Therapy

## 2016-02-11 ENCOUNTER — Ambulatory Visit (INDEPENDENT_AMBULATORY_CARE_PROVIDER_SITE_OTHER): Payer: Medicare Other | Admitting: *Deleted

## 2016-02-11 ENCOUNTER — Encounter: Payer: Self-pay | Admitting: *Deleted

## 2016-02-11 ENCOUNTER — Ambulatory Visit: Payer: Medicare Other | Admitting: Pharmacist

## 2016-02-11 VITALS — BP 136/80 | HR 74 | Temp 98.1°F | Ht 62.0 in | Wt 138.4 lb

## 2016-02-11 DIAGNOSIS — Z114 Encounter for screening for human immunodeficiency virus [HIV]: Secondary | ICD-10-CM | POA: Diagnosis not present

## 2016-02-11 DIAGNOSIS — Z1159 Encounter for screening for other viral diseases: Secondary | ICD-10-CM

## 2016-02-11 DIAGNOSIS — Z0000 Encounter for general adult medical examination without abnormal findings: Secondary | ICD-10-CM

## 2016-02-11 NOTE — Progress Notes (Addendum)
Subjective:   Mercedes Barr is a 70 y.o. female who presents for an Initial Medicare Annual Wellness Visit.  Cardiac Risk Factors include: advanced age (>4men, >14 women);diabetes mellitus;hypertension;sedentary lifestyle;dyslipidemia     Objective:    Today's Vitals   02/11/16 0912  BP: 136/80  Pulse: 74  Temp: 98.1 F (36.7 C)  TempSrc: Oral  Height: 5\' 2"  (1.575 m)  Weight: 138 lb 6.4 oz (62.778 kg)  SpO2: 99%  PainSc: 7    Body mass index is 25.31 kg/(m^2).   Current Medications (verified) Outpatient Encounter Prescriptions as of 02/11/2016  Medication Sig  . albuterol (PROAIR HFA) 108 (90 Base) MCG/ACT inhaler Use 2 puffs every 4 hours  as needed for wheezing  . albuterol (PROVENTIL) (2.5 MG/3ML) 0.083% nebulizer solution TAKE 1 VIAL (3 MLS) BY  NEBULIZATION EVERY 4 HOURS  AS NEEDED FOR WHEEZING  . aspirin 81 MG tablet Take 81 mg by mouth daily.  . citalopram (CELEXA) 20 MG tablet Take 1 tablet (20 mg total) by mouth every morning. Order number [id number]  . EPIPEN 2-PAK 0.3 MG/0.3ML SOAJ injection Inject 0.3 mLs (0.3 mg  total) into the muscle as  needed for severe allergic  reaction, and contact  medical provider.  . fluticasone (FLOVENT HFA) 110 MCG/ACT inhaler Inhale 1 puff into the lungs 2 (two) times daily.  . furosemide (LASIX) 20 MG tablet Take 1 tablet by mouth  every morning  . glipiZIDE (GLUCOTROL XL) 10 MG 24 hr tablet Take 1 tablet by mouth  daily with breakfast  . glucose blood test strip Use for blood sugar testing 1-3 times daily.  Marland Kitchen HYDROcodone-acetaminophen (NORCO/VICODIN) 5-325 MG per tablet Take 1-2 tablets by mouth every 4 (four) hours as needed for moderate pain.  . Lancets MISC Use with blood sugar monitor.  . levothyroxine (SYNTHROID, LEVOTHROID) 75 MCG tablet Take 1 tablet (75 mcg total) by mouth daily before breakfast. Order number [id number]. Needs thyroid check.  Marland Kitchen losartan (COZAAR) 25 MG tablet Take 1 tablet (25 mg total) by mouth daily.    . metFORMIN (GLUCOPHAGE) 500 MG tablet Take 2 tablets (1,000 mg total) by mouth 2 (two) times daily with a meal.  . methocarbamol (ROBAXIN) 500 MG tablet Take 1 tablet (500 mg total) by mouth every 6 (six) hours as needed for muscle spasms.  Marland Kitchen omeprazole (PRILOSEC) 20 MG capsule Take 1 capsule by mouth  twice daily  . ondansetron (ZOFRAN) 4 MG tablet Take 1 tablet (4 mg total) by mouth every 8 (eight) hours as needed for nausea.  . polyethylene glycol powder (GLYCOLAX/MIRALAX) powder TAKE 17 G BY MOUTH 2 (TWO) TIMES DAILY AS NEEDED FOR LAXATIVE  . potassium chloride SA (K-DUR,KLOR-CON) 20 MEQ tablet Take 1 tablet by mouth  daily  . propranolol (INDERAL) 40 MG tablet Take 1 tablet by mouth  daily  . rosuvastatin (CRESTOR) 20 MG tablet Take 1 tablet by mouth  daily  . sitaGLIPtin (JANUVIA) 100 MG tablet Take 1 tablet by mouth  every morning  . Travoprost, BAK Free, (TRAVATAN) 0.004 % SOLN ophthalmic solution Place 1 drop into both eyes at bedtime.  . traZODone (DESYREL) 50 MG tablet Take 0.5 tablets (25 mg total) by mouth at bedtime.  . triamcinolone cream (KENALOG) 0.5 % APPLY TOPICALLY TO ITCHY AREAS TWICE DAILY AS NEEDED FOR ITCHING UNTIL 2 TO 3 DAYS AFTER HEALED; Order number [id number]  . Omega-3 Fatty Acids (FISH OIL) 1000 MG CAPS Take 1 capsule by mouth every morning. Reported  on 02/11/2016   No facility-administered encounter medications on file as of 02/11/2016.    Allergies (verified) Chlorhexidine; Naproxen; Pregabalin; Propoxyphene n-acetaminophen; Statins; Amitriptyline; Benazepril; Propoxyphene; Amitriptyline hcl; Benazepril hcl; Gabapentin; and Tylox   History: Past Medical History  Diagnosis Date  . Diabetes mellitus   . Hypertension   . Hypothyroidism   . COPD (chronic obstructive pulmonary disease) ([place])   . Heart murmur   . GERD (gastroesophageal reflux disease)   . 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 McMullen.  OFFICE NOTE DR. A. LITTLE STATES  "EXTREMELY PACEMAKER SENSITIVE AND WHEN YOU TRY TO CHECK FOR UNDERLYING RHYTHMS SHE WILL HAVE SNYCOPE AND WE HAVE NOT DONE THIS NOW IN ABOUT 2 YEARS".  . Normal coronary arteries 2011  . Complete heart block ([place])   . Coronary artery disease   . Shortness of breath dyspnea     walking distance or climbing stairs  . Asthma    Past Surgical History  Procedure Laterality Date  . Pacemaker insertion  1988    last gen 11/08- MD[facility]  . Tubal ligation    . Cardiac catheterization  2011     SHOWED NO CAD-PER CARDIOLOGY OFFICE NOTES DR. A. LITTLE  . Atrioventricular cushion defect repair  1988  . Total knee arthroplasty Right 11/19/2012    Procedure: RIGHT TOTAL KNEE ARTHROPLASTY;  Surgeon: Gearlean Alf, MD;  Location: [facility];  Service: Orthopedics;  Laterality: Right;  . Insert / replace / remove pacemaker    . Colonscopy       removed polyps   . Total knee revision Right 09/24/2014    Procedure: RIGHT TOTAL KNEE ARTHROPLASTY REVISION;  Surgeon: Gearlean Alf, MD;  Location: [facility];  Service: Orthopedics;  Laterality: Right;   Family History  Problem Relation Age of Onset  . Heart failure Mother   . Hypertension Mother   . Heart failure Sister   . Stroke Father   . Cancer Brother   . Kidney disease Daughter    Social History   Occupational History  . Not on file.   Social History Main Topics  . Smoking status: Never Smoker   . Smokeless tobacco: Never Used  . Alcohol Use: No  . Drug Use: No  . Sexual Activity: No    Tobacco Counseling Counseling given: Yes   Activities of Daily Living In your present state of health, do you have any difficulty performing the following activities: 02/11/2016  Hearing? N  Vision? N  Difficulty concentrating or making decisions? N  Walking or climbing stairs? Y  Dressing or bathing? N  Doing errands, shopping? N  Preparing Food and eating ? N  Using the Toilet? N  In the past six months, have  you accidently leaked urine? N  Do you have problems with loss of bowel control? N  Managing your Medications? N  Managing your Finances? N  Housekeeping or managing your Housekeeping? N   Home Safety:  My home has a working smoke alarm:  Yes, "In every room"           My home throw rugs have been fastened down to the floor or removed:  All have been removed I have non-slip mats in the bathtub and shower:  Yes         All my home's stairs have railings or bannisters: Home is one level with ramp outside  My home's floors, stairs and hallways are free from clutter, wires and cords:  Yes        Immunizations and Health Maintenance Immunization History  Administered Date(s) Administered  . Influenza Split 09/07/2011, 05/10/2012  . Influenza Whole 06/01/2007, 05/28/2008, 07/07/2009, 07/15/2010  . Influenza,inj,Quad PF,36+ Mos 10/21/2013, 08/18/2014, 04/27/2015  . Pneumococcal Conjugate-13 05/21/2015  . Pneumococcal Polysaccharide-23 06/01/2007, 06/08/2012  . Td 01/06/2002   Health Maintenance Due  Topic Date Due  . Hepatitis C Screening  23-Apr-1946  . ZOSTAVAX  04/11/2007  . MAMMOGRAM  10/31/2009  . TETANUS/TDAP  01/07/2012  . DEXA SCAN  04/10/2012  . FOOT EXAM  09/11/2015  Hep C and HIV drawn today Patient will receive TDaP and Zostavax at [employer] Patient will call [facility] to schedule Mammogram and Bone Density  Diabetic Foot Exam - Simple   Simple Foot Form  Diabetic Foot exam was performed with the following findings:  Yes 02/11/2016 10:20 AM  Visual Inspection  No deformities, no ulcerations, no other skin breakdown bilaterally:  Yes  Sensation Testing  Intact to touch and monofilament testing bilaterally:  Yes  Pulse Check  Posterior Tibialis and Dorsalis pulse intact bilaterally:  Yes  Comments      Patient Care Team: Marjie Skiff, MD as PCP - General (Student) Lovena Neighbours, DMD (Dentistry) Rutherford Guys, MD as Consulting Physician  (Ophthalmology) Sanda Klein, MD as Consulting Physician (Cardiology)  Indicate any recent Medical Services you may have received from other than [facility] providers in the past year (date may be approximate).     Assessment:   This is a routine wellness examination for [place].   Hearing/Vision screen  Hearing Screening   125Hz  250Hz  500Hz  1000Hz  2000Hz  4000Hz  8000Hz   Right ear:   40 40 40 40   Left ear:   40 40 40 40     Dietary issues and exercise activities discussed: Current Exercise Habits: The patient does not participate in regular exercise at present, Exercise limited by: respiratory conditions(s);orthopedic condition(s);cardiac condition(s)   Patient has had recent increase in right knee pain for which she will be starting PT. This pain has prohibited her normal walking regimen. She desires to increase physical activity to 30 minutes daily 5 X per week in 10-15 minute increments once knee pain is under control.  Goals    . Blood Pressure < 140/90    . Exercise 150 minutes per week (moderate activity)          . HEMOGLOBIN A1C < 8    . Weight < 128 lb (58.06 kg)     7 %   Weight loss     Discussed recording consumption intake to assist with desired 7% weight loss. Recommended MyPLate or My Fitness Pal apps to assist with recording.  Depression Screen PHQ 2/9 Scores 02/11/2016 01/28/2016 07/14/2015 07/14/2015 05/21/2015 04/27/2015 12/31/2014  PHQ - 2 Score 4 0 0 0 0 0 0  PHQ- 9 Score 15 - - - - - -  Currently taking anti-depressant. Discussed making appt with behavioral Health. Patient will think about it.  Fall Risk Fall Risk  02/11/2016 01/28/2016 07/14/2015 07/14/2015 05/21/2015  Falls in the past year? No No No No No  Risk for fall due to : - - - - -  Risk for fall due to (comments): - - - - -    Cognitive Function: Mini-Cog passed with score 3/5  TUG Test:  Done in 13 seconds. Patient used both hands to push out  of chair and to sit back down. Walked with right sided  limp due to right knee pain. Patient has cane and rolling walker but did not bring to today's visit.   Screening Tests Health Maintenance  Topic Date Due  . Hepatitis C Screening  Jan 08, 1946  . ZOSTAVAX  04/11/2007  . MAMMOGRAM  10/31/2009  . TETANUS/TDAP  01/07/2012  . DEXA SCAN  04/10/2012  . FOOT EXAM  09/11/2015  . INFLUENZA VACCINE  03/08/2016  . OPHTHALMOLOGY EXAM  03/15/2016  . HEMOGLOBIN A1C  07/29/2016  . COLONOSCOPY  10/17/2020  . PNA vac Low Risk Adult  Completed      Plan:     During the course of the visit, Elvie was educated and counseled about the following appropriate screening and preventive services:   Vaccines to include Pneumoccal, Influenza, Td, Zostavax  Colorectal cancer screening  Bone density screening  Diabetes screening  Mammography/PAP  Nutrition counseling Patient Instructions (the written plan) were given to the patient.    Velora Heckler, RN   02/11/2016   Addendum: I have reviewed this visit and discussed with Howell Rucks, RN, BSN, and agree with her documentation.  Marjie Skiff MD, 02/14/16

## 2016-02-11 NOTE — Patient Instructions (Signed)
Bone Densitometry Bone densitometry is an imaging test that uses a special X-ray to measure the amount of calcium and other minerals in your bones (bone density). This test is also known as a bone mineral density test or dual-energy X-ray absorptiometry (DXA). The test can measure bone density at your hip and your spine. It is similar to having a regular X-ray. You may have this test to:  Diagnose a condition that causes weak or thin bones (osteoporosis).  Predict your risk of a broken bone (fracture).  Determine how well osteoporosis treatment is working. LET [facility] CARE PROVIDER KNOW ABOUT:  Any allergies you have.  All medicines you are taking, including vitamins, herbs, eye drops, creams, and over-the-counter medicines.  Previous problems you or members of your family have had with the use of anesthetics.  Any blood disorders you have.  Previous surgeries you have had.  Medical conditions you have.  Possibility of pregnancy.  Any other medical test you had within the previous 14 days that used contrast material. RISKS AND COMPLICATIONS Generally, this is a safe procedure. However, problems can occur and may include the following:  This test exposes you to a very small amount of radiation.  The risks of radiation exposure may be greater to unborn children. BEFORE THE PROCEDURE  Do not take any calcium supplements for 24 hours before having the test. You can otherwise eat and drink what you usually do.  Take off all metal jewelry, eyeglasses, dental appliances, and any other metal objects. PROCEDURE  You may lie on an exam table. There will be an X-ray generator below you and an imaging device above you.  Other devices, such as boxes or braces, may be used to position your body properly for the scan.  You will need to lie still while the machine slowly scans your body.  The images will show up on a computer monitor. AFTER THE PROCEDURE You may need more testing  at a later time.   This information is not intended to replace advice given to you by your health care provider. Make sure you discuss any questions you have with your health care provider.   Document Released: 08/16/2004 Document Revised: 08/15/2014 Document Reviewed: 01/02/2014 Elsevier Interactive Patient Education 2016 [place].  Diabetes and Foot Care Diabetes may cause you to have problems because of poor blood supply (circulation) to your feet and legs. This may cause the skin on your feet to become thinner, break easier, and heal more slowly. Your skin may become dry, and the skin may peel and crack. You may also have nerve damage in your legs and feet causing decreased feeling in them. You may not notice minor injuries to your feet that could lead to infections or more serious problems. Taking care of your feet is one of the most important things you can do for yourself.  HOME CARE INSTRUCTIONS  Wear shoes at all times, even in the house. Do not go barefoot. Bare feet are easily injured.  Check your feet daily for blisters, cuts, and redness. If you cannot see the bottom of your feet, use a mirror or ask someone for help.  Wash your feet with warm water (do not use hot water) and mild soap. Then pat your feet and the areas between your toes until they are completely dry. Do not soak your feet as this can dry your skin.  Apply a moisturizing lotion or petroleum jelly (that does not contain alcohol and is unscented) to the  skin on your feet and to dry, brittle toenails. Do not apply lotion between your toes.  Trim your toenails straight across. Do not dig under them or around the cuticle. File the edges of your nails with an emery board or nail file.  Do not cut corns or calluses or try to remove them with medicine.  Wear clean socks or stockings every day. Make sure they are not too tight. Do not wear knee-high stockings since they may decrease blood flow to your legs.  Wear  shoes that fit properly and have enough cushioning. To break in new shoes, wear them for just a few hours a day. This prevents you from injuring your feet. Always look in your shoes before you put them on to be sure there are no objects inside.  Do not cross your legs. This may decrease the blood flow to your feet.  If you find a minor scrape, cut, or break in the skin on your feet, keep it and the skin around it clean and dry. These areas may be cleansed with mild soap and water. Do not cleanse the area with peroxide, alcohol, or iodine.  When you remove an adhesive bandage, be sure not to damage the skin around it.  If you have a wound, look at it several times a day to make sure it is healing.  Do not use heating pads or hot water bottles. They may burn your skin. If you have lost feeling in your feet or legs, you may not know it is happening until it is too late.  Make sure your health care provider performs a complete foot exam at least annually or more often if you have foot problems. Report any cuts, sores, or bruises to your health care provider immediately. SEEK MEDICAL CARE IF:   You have an injury that is not healing.  You have cuts or breaks in the skin.  You have an ingrown nail.  You notice redness on your legs or feet.  You feel burning or tingling in your legs or feet.  You have pain or cramps in your legs and feet.  Your legs or feet are numb.  Your feet always feel cold. SEEK IMMEDIATE MEDICAL CARE IF:   There is increasing redness, swelling, or pain in or around a wound.  There is a red line that goes up your leg.  Pus is coming from a wound.  You develop a fever or as directed by your health care provider.  You notice a bad smell coming from an ulcer or wound.   This information is not intended to replace advice given to you by your health care provider. Make sure you discuss any questions you have with your health care provider.   Document Released:  07/22/2000 Document Revised: 03/27/2013 Document Reviewed: 01/01/2013 Elsevier Interactive Patient Education 2016 [place] in the Home  Falls can cause injuries. They can happen to people of all ages. There are many things you can do to make your home safe and to help prevent falls.  WHAT CAN I DO ON THE OUTSIDE OF MY HOME?  Regularly fix the edges of walkways and driveways and fix any cracks.  Remove anything that might make you trip as you walk through a door, such as a raised step or threshold.  Trim any bushes or trees on the path to your home.  Use bright outdoor lighting.  Clear any walking paths of anything that might make someone trip,  such as rocks or tools.  Regularly check to see if handrails are loose or broken. Make sure that both sides of any steps have handrails.  Any raised decks and porches should have guardrails on the edges.  Have any leaves, snow, or ice cleared regularly.  Use sand or salt on walking paths during winter.  Clean up any spills in your garage right away. This includes oil or grease spills. WHAT CAN I DO IN THE BATHROOM?   Use night lights.  Install grab bars by the toilet and in the tub and shower. Do not use towel bars as grab bars.  Use non-skid mats or decals in the tub or shower.  If you need to sit down in the shower, use a plastic, non-slip stool.  Keep the floor dry. Clean up any water that spills on the floor as soon as it happens.  Remove soap buildup in the tub or shower regularly.  Attach bath mats securely with double-sided non-slip rug tape.  Do not have throw rugs and other things on the floor that can make you trip. WHAT CAN I DO IN THE BEDROOM?  Use night lights.  Make sure that you have a light by your bed that is easy to reach.  Do not use any sheets or blankets that are too big for your bed. They should not hang down onto the floor.  Have a firm chair that has side arms. You can use this  for support while you get dressed.  Do not have throw rugs and other things on the floor that can make you trip. WHAT CAN I DO IN THE KITCHEN?  Clean up any spills right away.  Avoid walking on wet floors.  Keep items that you use a lot in easy-to-reach places.  If you need to reach something above you, use a strong step stool that has a grab bar.  Keep electrical cords out of the way.  Do not use floor polish or wax that makes floors slippery. If you must use wax, use non-skid floor wax.  Do not have throw rugs and other things on the floor that can make you trip. WHAT CAN I DO WITH MY STAIRS?  Do not leave any items on the stairs.  Make sure that there are handrails on both sides of the stairs and use them. Fix handrails that are broken or loose. Make sure that handrails are as long as the stairways.  Check any carpeting to make sure that it is firmly attached to the stairs. Fix any carpet that is loose or worn.  Avoid having throw rugs at the top or bottom of the stairs. If you do have throw rugs, attach them to the floor with carpet tape.  Make sure that you have a light switch at the top of the stairs and the bottom of the stairs. If you do not have them, ask someone to add them for you. WHAT ELSE CAN I DO TO HELP PREVENT FALLS?  Wear shoes that:  Do not have high heels.  Have rubber bottoms.  Are comfortable and fit you well.  Are closed at the toe. Do not wear sandals.  If you use a stepladder:  Make sure that it is fully opened. Do not climb a closed stepladder.  Make sure that both sides of the stepladder are locked into place.  Ask someone to hold it for you, if possible.  Clearly mark and make sure that you can see:  Any grab  bars or handrails.  First and last steps.  Where the edge of each step is.  Use tools that help you move around (mobility aids) if they are needed. These include:  Canes.  Walkers.  Scooters.  Crutches.  Turn on the  lights when you go into a dark area. Replace any light bulbs as soon as they burn out.  Set up your furniture so you have a clear path. Avoid moving your furniture around.  If any of your floors are uneven, fix them.  If there are any pets around you, be aware of where they are.  Review your medicines with your doctor. Some medicines can make you feel dizzy. This can increase your chance of falling. Ask your doctor what other things that you can do to help prevent falls.   This information is not intended to replace advice given to you by your health care provider. Make sure you discuss any questions you have with your health care provider.   Document Released: 05/21/2009 Document Revised: 12/09/2014 Document Reviewed: 08/29/2014 Elsevier Interactive Patient Education 2016 [place] Maintenance, Female Adopting a healthy lifestyle and getting preventive care can go a long way to promote health and wellness. Talk with your health care provider about what schedule of regular examinations is right for you. This is a good chance for you to check in with your provider about disease prevention and staying healthy. In between checkups, there are plenty of things you can do on your own. Experts have done a lot of research about which lifestyle changes and preventive measures are most likely to keep you healthy. Ask your health care provider for more information. WEIGHT AND DIET  Eat a healthy diet  Be sure to include plenty of vegetables, fruits, low-fat dairy products, and lean protein.  Do not eat a lot of foods high in solid fats, added sugars, or salt.  Get regular exercise. This is one of the most important things you can do for your health.  Most adults should exercise for at least 150 minutes each week. The exercise should increase your heart rate and make you sweat (moderate-intensity exercise).  Most adults should also do strengthening exercises at least twice a week. This  is in addition to the moderate-intensity exercise.  Maintain a healthy weight  Body mass index (BMI) is a measurement that can be used to identify possible weight problems. It estimates body fat based on height and weight. Your health care provider can help determine your BMI and help you achieve or maintain a healthy weight.  For females 3 years of age and older:   A BMI below 18.5 is considered underweight.  A BMI of 18.5 to 24.9 is normal.  A BMI of 25 to 29.9 is considered overweight.  A BMI of 30 and above is considered obese.  Watch levels of cholesterol and blood lipids  You should start having your blood tested for lipids and cholesterol at 69 years of age, then have this test every 5 years.  You may need to have your cholesterol levels checked more often if:  Your lipid or cholesterol levels are high.  You are older than 69 years of age.  You are at high risk for heart disease.  CANCER SCREENING   Lung Cancer  Lung cancer screening is recommended for adults 58-88 years old who are at high risk for lung cancer because of a history of smoking.  A yearly low-dose CT scan of the lungs is  recommended for people who:  Currently smoke.  Have quit within the past 15 years.  Have at least a 30-pack-year history of smoking. A pack year is smoking an average of one pack of cigarettes a day for 1 year.  Yearly screening should continue until it has been 15 years since you quit.  Yearly screening should stop if you develop a health problem that would prevent you from having lung cancer treatment.  Breast Cancer  Practice breast self-awareness. This means understanding how your breasts normally appear and feel.  It also means doing regular breast self-exams. Let your health care provider know about any changes, no matter how small.  If you are in your 20s or 30s, you should have a clinical breast exam (CBE) by a health care provider every 1-3 years as part of a  regular health exam.  If you are 47 or older, have a CBE every year. Also consider having a breast X-ray (mammogram) every year.  If you have a family history of breast cancer, talk to your health care provider about genetic screening.  If you are at high risk for breast cancer, talk to your health care provider about having an MRI and a mammogram every year.  Breast cancer gene (BRCA) assessment is recommended for women who have family members with BRCA-related cancers. BRCA-related cancers include:  Breast.  Ovarian.  Tubal.  Peritoneal cancers.  Results of the assessment will determine the need for genetic counseling and BRCA1 and BRCA2 testing. Cervical Cancer Your health care provider may recommend that you be screened regularly for cancer of the pelvic organs (ovaries, uterus, and vagina). This screening involves a pelvic examination, including checking for microscopic changes to the surface of your cervix (Pap test). You may be encouraged to have this screening done every 3 years, beginning at age 66.  For women ages 39-65, health care providers may recommend pelvic exams and Pap testing every 3 years, or they may recommend the Pap and pelvic exam, combined with testing for human papilloma virus (HPV), every 5 years. Some types of HPV increase your risk of cervical cancer. Testing for HPV may also be done on women of any age with unclear Pap test results.  Other health care providers may not recommend any screening for nonpregnant women who are considered low risk for pelvic cancer and who do not have symptoms. Ask your health care provider if a screening pelvic exam is right for you.  If you have had past treatment for cervical cancer or a condition that could lead to cancer, you need Pap tests and screening for cancer for at least 20 years after your treatment. If Pap tests have been discontinued, your risk factors (such as having a new sexual partner) need to be reassessed to  determine if screening should resume. Some women have medical problems that increase the chance of getting cervical cancer. In these cases, your health care provider may recommend more frequent screening and Pap tests. Colorectal Cancer  This type of cancer can be detected and often prevented.  Routine colorectal cancer screening usually begins at 69 years of age and continues through 69 years of age.  Your health care provider may recommend screening at an earlier age if you have risk factors for colon cancer.  Your health care provider may also recommend using home test kits to check for hidden blood in the stool.  A small camera at the end of a tube can be used to examine your colon directly (  sigmoidoscopy or colonoscopy). This is done to check for the earliest forms of colorectal cancer.  Routine screening usually begins at age 64.  Direct examination of the colon should be repeated every 5-10 years through 69 years of age. However, you may need to be screened more often if early forms of precancerous polyps or small growths are found. Skin Cancer  Check your skin from head to toe regularly.  Tell your health care provider about any new moles or changes in moles, especially if there is a change in a mole's shape or color.  Also tell your health care provider if you have a mole that is larger than the size of a pencil eraser.  Always use sunscreen. Apply sunscreen liberally and repeatedly throughout the day.  Protect yourself by wearing long sleeves, pants, a wide-brimmed hat, and sunglasses whenever you are outside. HEART DISEASE, DIABETES, AND HIGH BLOOD PRESSURE   High blood pressure causes heart disease and increases the risk of stroke. High blood pressure is more likely to develop in:  People who have blood pressure in the high end of the normal range (130-139/85-89 mm Hg).  People who are overweight or obese.  People who are African American.  If you are 18-39 years of  age, have your blood pressure checked every 3-5 years. If you are 64 years of age or older, have your blood pressure checked every year. You should have your blood pressure measured twice--once when you are at a hospital or clinic, and once when you are not at a hospital or clinic. Record the average of the two measurements. To check your blood pressure when you are not at a hospital or clinic, you can use:  An automated blood pressure machine at a pharmacy.  A home blood pressure monitor.  If you are between 71 years and 82 years old, ask your health care provider if you should take aspirin to prevent strokes.  Have regular diabetes screenings. This involves taking a blood sample to check your fasting blood sugar level.  If you are at a normal weight and have a low risk for diabetes, have this test once every three years after 69 years of age.  If you are overweight and have a high risk for diabetes, consider being tested at a younger age or more often. PREVENTING INFECTION  Hepatitis B  If you have a higher risk for hepatitis B, you should be screened for this virus. You are considered at high risk for hepatitis B if:  You were born in a country where hepatitis B is common. Ask your health care provider which countries are considered high risk.  Your parents were born in a high-risk country, and you have not been immunized against hepatitis B (hepatitis B vaccine).  You have HIV or AIDS.  You use needles to inject street drugs.  You live with someone who has hepatitis B.  You have had sex with someone who has hepatitis B.  You get hemodialysis treatment.  You take certain medicines for conditions, including cancer, organ transplantation, and autoimmune conditions. Hepatitis C  Blood testing is recommended for:  Everyone born from [birth year] through 1964.  Anyone with known risk factors for hepatitis C. Sexually transmitted infections (STIs)  You should be screened for sexually  transmitted infections (STIs) including gonorrhea and chlamydia if:  You are sexually active and are younger than 69 years of age.  You are older than 70 years of age and your health care provider tells you  that you are at risk for this type of infection.  Your sexual activity has changed since you were last screened and you are at an increased risk for chlamydia or gonorrhea. Ask your health care provider if you are at risk.  If you do not have HIV, but are at risk, it may be recommended that you take a prescription medicine daily to prevent HIV infection. This is called pre-exposure prophylaxis (PrEP). You are considered at risk if:  You are sexually active and do not regularly use condoms or know the HIV status of your partner(s).  You take drugs by injection.  You are sexually active with a partner who has HIV. Talk with your health care provider about whether you are at high risk of being infected with HIV. If you choose to begin PrEP, you should first be tested for HIV. You should then be tested every 3 months for as long as you are taking PrEP.  PREGNANCY   If you are premenopausal and you may become pregnant, ask your health care provider about preconception counseling.  If you may become pregnant, take 400 to 800 micrograms (mcg) of folic acid every day.  If you want to prevent pregnancy, talk to your health care provider about birth control (contraception). OSTEOPOROSIS AND MENOPAUSE   Osteoporosis is a disease in which the bones lose minerals and strength with aging. This can result in serious bone fractures. Your risk for osteoporosis can be identified using a bone density scan.  If you are 31 years of age or older, or if you are at risk for osteoporosis and fractures, ask your health care provider if you should be screened.  Ask your health care provider whether you should take a calcium or vitamin D supplement to lower your risk for osteoporosis.  Menopause may have  certain physical symptoms and risks.  Hormone replacement therapy may reduce some of these symptoms and risks. Talk to your health care provider about whether hormone replacement therapy is right for you.  HOME CARE INSTRUCTIONS   Schedule regular health, dental, and eye exams.  Stay current with your immunizations.   Do not use any tobacco products including cigarettes, chewing tobacco, or electronic cigarettes.  If you are pregnant, do not drink alcohol.  If you are breastfeeding, limit how much and how often you drink alcohol.  Limit alcohol intake to no more than 1 drink per day for nonpregnant women. One drink equals 12 ounces of beer, 5 ounces of wine, or 1 ounces of hard liquor.  Do not use street drugs.  Do not share needles.  Ask your health care provider for help if you need support or information about quitting drugs.  Tell your health care provider if you often feel depressed.  Tell your health care provider if you have ever been abused or do not feel safe at home.   This information is not intended to replace advice given to you by your health care provider. Make sure you discuss any questions you have with your health care provider.   Document Released: 02/07/2011 Document Revised: 08/15/2014 Document Reviewed: 06/26/2013 Elsevier Interactive Patient Education [employer].

## 2016-02-12 ENCOUNTER — Ambulatory Visit (INDEPENDENT_AMBULATORY_CARE_PROVIDER_SITE_OTHER): Payer: Medicare Other | Admitting: Family Medicine

## 2016-02-12 ENCOUNTER — Encounter: Payer: Self-pay | Admitting: Family Medicine

## 2016-02-12 VITALS — BP 128/85 | HR 88 | Temp 98.3°F | Wt 138.0 lb

## 2016-02-12 DIAGNOSIS — J42 Unspecified chronic bronchitis: Secondary | ICD-10-CM

## 2016-02-12 LAB — HIV ANTIBODY (ROUTINE TESTING W REFLEX): HIV 1&2 Ab, 4th Generation: NONREACTIVE

## 2016-02-12 LAB — HEPATITIS C ANTIBODY: HCV AB: NEGATIVE

## 2016-02-12 MED ORDER — DOXYCYCLINE HYCLATE 100 MG PO TABS: 100.0000 mg | ORAL_TABLET | Freq: Two times a day (BID) | ORAL | Status: DC

## 2016-02-12 MED ORDER — PREDNISONE 50 MG PO TABS: 50.0000 mg | ORAL_TABLET | Freq: Every day | ORAL | Status: DC

## 2016-02-12 NOTE — Assessment & Plan Note (Signed)
-   Suspect exacerbation - Doxycycline - Prednisone - Follow up if no improvement. Discussed red flag symptom that should prompt trip to ED

## 2016-02-12 NOTE — Progress Notes (Signed)
Subjective:     Patient ID: Mercedes Barr, female   DOB: 1945/10/21, 70 y.o.   MRN: [id number]  HPI Mercedes Barr is a 70yo female presenting for productive cough. - Cough producing yellow sputum - Symptoms present for approximately one week - Worse at night - Some wheezing. Has been using Albuterol four times a day, which is increased from baseline - Feels like previous COPD flares - Has been using Mucinex, which hasn't helped - Denies chest pain, fever, nausea, vomiting, diarrhea, constipation - Nonsmoker  Review of Systems Per HPI. Other systems negative.    Objective:   Physical Exam  Constitutional: She appears well-developed and well-nourished. No distress.  Cardiovascular: Normal rate.  Exam reveals no gallop and no friction rub.   No murmur heard. Pulmonary/Chest: Effort normal. No respiratory distress. She has no wheezes.  Musculoskeletal: She exhibits no edema.  Skin: No rash noted.  Psychiatric: She has a normal mood and affect. Her behavior is normal.      Assessment and Plan:     COPD (chronic obstructive pulmonary disease) - Suspect exacerbation - Doxycycline - Prednisone - Follow up if no improvement. Discussed red flag symptom that should prompt trip to ED

## 2016-02-12 NOTE — Patient Instructions (Signed)
Thank you so much for coming to visit me today! I suspect you are having a COPD/Bronchitis flare. I have sent an antibiotic and an prescription for a steroid to the pharmacy. With the steroid, you can anticipate having elevated sugars. Please let us know if symptoms fail to improve over the next week. If your shortness of breath worsens, please call 911 or go to the Emergency Department.   Thanks! Dr. Gerlean Ren  Chronic Obstructive Pulmonary Disease Chronic obstructive pulmonary disease (COPD) is a common lung condition in which airflow from the lungs is limited. COPD is a general term that can be used to describe many different lung problems that limit airflow, including both chronic bronchitis and emphysema. If you have COPD, your lung function will probably never return to normal, but there are measures you can take to improve lung function and make yourself feel better. CAUSES   Smoking (common).  Exposure to secondhand smoke.  Genetic problems.  Chronic inflammatory lung diseases or recurrent infections. SYMPTOMS  Shortness of breath, especially with physical activity.  Deep, persistent (chronic) cough with a large amount of thick mucus.  Wheezing.  Rapid breaths (tachypnea).  Gray or bluish discoloration (cyanosis) of the skin, especially in your fingers, toes, or lips.  Fatigue.  Weight loss.  Frequent infections or episodes when breathing symptoms become much worse (exacerbations).  Chest tightness. DIAGNOSIS Your health care provider will take a medical history and perform a physical examination to diagnose COPD. Additional tests for COPD may include:  Lung (pulmonary) function tests.  Chest X-ray.  CT scan.  Blood tests. TREATMENT  Treatment for COPD may include:  Inhaler and nebulizer medicines. These help manage the symptoms of COPD and make your breathing more comfortable.  Supplemental oxygen. Supplemental oxygen is only helpful if you have a low oxygen  level in your blood.  Exercise and physical activity. These are beneficial for nearly all people with COPD.  Lung surgery or transplant.  Nutrition therapy to gain weight, if you are underweight.  Pulmonary rehabilitation. This may involve working with a team of health care providers and specialists, such as respiratory, occupational, and physical therapists. HOME CARE INSTRUCTIONS  Take all medicines (inhaled or pills) as directed by your health care provider.  Avoid over-the-counter medicines or cough syrups that dry up your airway (such as antihistamines) and slow down the elimination of secretions unless instructed otherwise by your health care provider.  If you are a smoker, the most important thing that you can do is stop smoking. Continuing to smoke will cause further lung damage and breathing trouble. Ask your health care provider for help with quitting smoking. He or she can direct you to community resources or hospitals that provide support.  Avoid exposure to irritants such as smoke, chemicals, and fumes that aggravate your breathing.  Use oxygen therapy and pulmonary rehabilitation if directed by your health care provider. If you require home oxygen therapy, ask your health care provider whether you should purchase a pulse oximeter to measure your oxygen level at home.  Avoid contact with individuals who have a contagious illness.  Avoid extreme temperature and humidity changes.  Eat healthy foods. Eating smaller, more frequent meals and resting before meals may help you maintain your strength.  Stay active, but balance activity with periods of rest. Exercise and physical activity will help you maintain your ability to do things you want to do.  Preventing infection and hospitalization is very important when you have COPD. Make sure to receive  all the vaccines your health care provider recommends, especially the pneumococcal and influenza vaccines. Ask your health care  provider whether you need a pneumonia vaccine.  Learn and use relaxation techniques to manage stress.  Learn and use controlled breathing techniques as directed by your health care provider. Controlled breathing techniques include:  Pursed lip breathing. Start by breathing in (inhaling) through your nose for 1 second. Then, purse your lips as if you were going to whistle and breathe out (exhale) through the pursed lips for 2 seconds.  Diaphragmatic breathing. Start by putting one hand on your abdomen just above your waist. Inhale slowly through your nose. The hand on your abdomen should move out. Then purse your lips and exhale slowly. You should be able to feel the hand on your abdomen moving in as you exhale.  Learn and use controlled coughing to clear mucus from your lungs. Controlled coughing is a series of short, progressive coughs. The steps of controlled coughing are: 1. Lean your head slightly forward. 2. Breathe in deeply using diaphragmatic breathing. 3. Try to hold your breath for 3 seconds. 4. Keep your mouth slightly open while coughing twice. 5. Spit any mucus out into a tissue. 6. Rest and repeat the steps once or twice as needed. SEEK MEDICAL CARE IF:  You are coughing up more mucus than usual.  There is a change in the color or thickness of your mucus.  Your breathing is more labored than usual.  Your breathing is faster than usual. SEEK IMMEDIATE MEDICAL CARE IF:  You have shortness of breath while you are resting.  You have shortness of breath that prevents you from:  Being able to talk.  Performing your usual physical activities.  You have chest pain lasting longer than 5 minutes.  Your skin color is more cyanotic than usual.  You measure low oxygen saturations for longer than 5 minutes with a pulse oximeter. MAKE SURE YOU:  Understand these instructions.  Will watch your condition.  Will get help right away if you are not doing well or get worse.    This information is not intended to replace advice given to you by your health care provider. Make sure you discuss any questions you have with your health care provider.   Document Released: 05/04/2005 Document Revised: 08/15/2014 Document Reviewed: 03/21/2013 Elsevier Interactive Patient Education [employer].

## 2016-02-14 ENCOUNTER — Encounter: Payer: Self-pay | Admitting: *Deleted

## 2016-02-24 ENCOUNTER — Other Ambulatory Visit: Payer: Self-pay | Admitting: Family Medicine

## 2016-03-04 ENCOUNTER — Ambulatory Visit: Payer: Medicare Other | Admitting: Pharmacist

## 2016-03-16 ENCOUNTER — Other Ambulatory Visit: Payer: Self-pay | Admitting: *Deleted

## 2016-03-16 MED ORDER — POLYETHYLENE GLYCOL 3350 17 GM/SCOOP PO POWD: ORAL | 0 refills | Status: DC

## 2016-04-04 DIAGNOSIS — Z79899 Other long term (current) drug therapy: Secondary | ICD-10-CM | POA: Diagnosis not present

## 2016-04-04 DIAGNOSIS — E785 Hyperlipidemia, unspecified: Secondary | ICD-10-CM | POA: Diagnosis not present

## 2016-04-04 DIAGNOSIS — E119 Type 2 diabetes mellitus without complications: Secondary | ICD-10-CM | POA: Diagnosis not present

## 2016-04-05 ENCOUNTER — Encounter (INDEPENDENT_AMBULATORY_CARE_PROVIDER_SITE_OTHER): Payer: Self-pay

## 2016-04-05 ENCOUNTER — Ambulatory Visit (INDEPENDENT_AMBULATORY_CARE_PROVIDER_SITE_OTHER): Payer: Medicare Other | Admitting: Cardiovascular Disease

## 2016-04-05 ENCOUNTER — Encounter: Payer: Self-pay | Admitting: Cardiovascular Disease

## 2016-04-05 ENCOUNTER — Telehealth: Payer: Self-pay | Admitting: Family Medicine

## 2016-04-05 VITALS — BP 110/70 | HR 80 | Ht 62.0 in | Wt 140.2 lb

## 2016-04-05 DIAGNOSIS — Z950 Presence of cardiac pacemaker: Secondary | ICD-10-CM

## 2016-04-05 DIAGNOSIS — E785 Hyperlipidemia, unspecified: Secondary | ICD-10-CM | POA: Diagnosis not present

## 2016-04-05 DIAGNOSIS — I442 Atrioventricular block, complete: Secondary | ICD-10-CM | POA: Diagnosis not present

## 2016-04-05 DIAGNOSIS — I5030 Unspecified diastolic (congestive) heart failure: Secondary | ICD-10-CM

## 2016-04-05 DIAGNOSIS — I10 Essential (primary) hypertension: Secondary | ICD-10-CM

## 2016-04-05 DIAGNOSIS — E119 Type 2 diabetes mellitus without complications: Secondary | ICD-10-CM

## 2016-04-05 LAB — HEMOGLOBIN A1C
HEMOGLOBIN A1C: 10.2 % — AB (ref <5.7)
MEAN PLASMA GLUCOSE: 246 mg/dL

## 2016-04-05 LAB — CUP PACEART INCLINIC DEVICE CHECK
Battery Impedance: 3081 Ohm
Battery Voltage: 2.69 V
Brady Statistic AP VS Percent: 0 %
Brady Statistic AS VP Percent: 23 %
Brady Statistic AS VS Percent: 0 %
Implantable Lead Implant Date: 19990203
Implantable Lead Implant Date: 19990203
Implantable Lead Location: 753859
Implantable Lead Model: 5068
Lead Channel Impedance Value: 700 Ohm
Lead Channel Pacing Threshold Amplitude: 0.875 V
Lead Channel Pacing Threshold Pulse Width: 0.4 ms
Lead Channel Pacing Threshold Pulse Width: 0.4 ms
Lead Channel Pacing Threshold Pulse Width: 0.4 ms
Lead Channel Sensing Intrinsic Amplitude: 2 mV
Lead Channel Setting Pacing Pulse Width: 0.4 ms
MDC IDC LEAD LOCATION: 753860
MDC IDC MSMT BATTERY REMAINING LONGEVITY: 14 mo
MDC IDC MSMT LEADCHNL RA PACING THRESHOLD AMPLITUDE: 1 V
MDC IDC MSMT LEADCHNL RA PACING THRESHOLD PULSEWIDTH: 0.4 ms
MDC IDC MSMT LEADCHNL RV IMPEDANCE VALUE: 673 Ohm
MDC IDC MSMT LEADCHNL RV PACING THRESHOLD AMPLITUDE: 0.75 V
MDC IDC MSMT LEADCHNL RV PACING THRESHOLD AMPLITUDE: 1 V
MDC IDC SESS DTM: 20170829133957
MDC IDC SET LEADCHNL RA PACING AMPLITUDE: 2 V
MDC IDC SET LEADCHNL RV PACING AMPLITUDE: 2.5 V
MDC IDC SET LEADCHNL RV SENSING SENSITIVITY: 2.8 mV
MDC IDC STAT BRADY AP VP PERCENT: 77 %

## 2016-04-05 LAB — COMPREHENSIVE METABOLIC PANEL
ALBUMIN: 4.3 g/dL (ref 3.6–5.1)
ALK PHOS: 58 U/L (ref 33–130)
ALT: 33 U/L — AB (ref 6–29)
AST: 26 U/L (ref 10–35)
BILIRUBIN TOTAL: 0.3 mg/dL (ref 0.2–1.2)
BUN: 12 mg/dL (ref 7–25)
CALCIUM: 9.6 mg/dL (ref 8.6–10.4)
CO2: 28 mmol/L (ref 20–31)
Chloride: 100 mmol/L (ref 98–110)
Creat: 0.84 mg/dL (ref 0.50–0.99)
GLUCOSE: 236 mg/dL — AB (ref 65–99)
POTASSIUM: 4.6 mmol/L (ref 3.5–5.3)
Sodium: 139 mmol/L (ref 135–146)
TOTAL PROTEIN: 6.9 g/dL (ref 6.1–8.1)

## 2016-04-05 LAB — LIPID PANEL
CHOLESTEROL: 110 mg/dL — AB (ref 125–200)
HDL: 42 mg/dL — ABNORMAL LOW (ref >=46)
LDL Cholesterol: 39 mg/dL (ref <130)
TRIGLYCERIDES: 146 mg/dL (ref <150)
Total CHOL/HDL Ratio: 2.6 Ratio (ref <=5.0)
VLDL: 29 mg/dL (ref <30)

## 2016-04-05 NOTE — Telephone Encounter (Signed)
LM for patient ok per DPR in chart to call back and schedule appointment with PCP. Jazmin Hartsell,CMA

## 2016-04-05 NOTE — Patient Instructions (Addendum)
Dr Croitoru recommends that you schedule a follow-up appointment in 3 months with a pacemaker check.  If you need a refill on your cardiac medications before your next appointment, please call your pharmacy. 

## 2016-04-05 NOTE — Progress Notes (Signed)
Patient ID: Mercedes Barr, female   DOB: [birth year], 70 y.o.   MRN: [id number]    Cardiology Office Note    Date:  04/05/2016   ID:  Mercedes Barr, DOB [birth year], MRN [id number]  PCP:  Marjie Skiff, MD  Cardiologist:   Sanda Klein, MD   Chief Complaint  Patient presents with  . Follow-up    History of Present Illness:  Mercedes Barr is a 70 y.o. female with complete heart block, pacemaker dependent, history of endocardial cushion defect repair, heart failure with preserved left ventricular systolic function.  She has had a difficult time emotionally. Recently she has been to 2 funerals of close family members. Her sister passed from a lengthy illness. Her great nephew was shot at age 82. She has glycemic control has deteriorated. Her last hemoglobin A1c was about 10%.  She is well compensated on a very low dose of furosemide. She denies orthopnea or PND and has not had palpitations, syncope, chest discomfort on exertion, neurological complaints. As always when we interrogate her pacemaker she has a brief sensation of near syncope that she finds very disturbing. She therefore does not want to do home downloads since even the brief magnet mode switch makes her feel unwell.  She reports good glycemic control.  Pacemaker interrogation shows roughly 14 months of generator longevity (range 1-26 months). There are 77% atrial pacing with good heart rate histogram distribution and 100% ventricular pacing. Occasional very brief episodes of mode switch have been recorded. There is no evidence of atrial fibrillation.  She had surgical repair of atrioventricular cushion defect repair in 1988 when she was 70 years old. Surgery was complicated by development of complete heart block and she received a pacemaker (her initial device probably was an abdominal generator with epicardial leads). A right subclavian pacemaker was placed in 1991, but had to be removed because of complaints of pain. She had  a complete revision of the system in 1999 and her current left subclavian atrial and ventricular leads date to that time. Her most recent generator change out occurred in 2008. The atrial lead is a Medtronic [id number]. The ventricular lead is Medtronic [id number]. The current generator is a Medtronic Adapta.  Past Medical History:  Diagnosis Date  . 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 Asthma   . Complete heart block ([place])   . COPD (chronic obstructive pulmonary disease) ([place])   . Coronary artery disease   . Diabetes mellitus   . GERD (gastroesophageal reflux disease)   . Heart murmur   . Hypertension   . Hypothyroidism   . Normal coronary arteries 2011  . Pacemaker    PACEMAKER DEPENDENT-DR. [place].  OFFICE NOTE DR. A. LITTLE STATES  "EXTREMELY PACEMAKER SENSITIVE AND WHEN YOU TRY TO CHECK FOR UNDERLYING RHYTHMS SHE WILL HAVE SNYCOPE AND WE HAVE NOT DONE THIS NOW IN ABOUT 2 YEARS".  . Shortness of breath dyspnea    walking distance or climbing stairs    Past Surgical History:  Procedure Laterality Date  . ATRIOVENTRICULAR CUSHION DEFECT REPAIR  1988  . CARDIAC CATHETERIZATION  2011    SHOWED NO CAD-PER CARDIOLOGY OFFICE NOTES DR. A. LITTLE  . colonscopy      removed polyps   . INSERT / REPLACE / REMOVE PACEMAKER    . PACEMAKER INSERTION  1988   last gen 11/08- MDT  . TOTAL KNEE ARTHROPLASTY Right 11/19/2012   Procedure: RIGHT TOTAL KNEE ARTHROPLASTY;  Surgeon: Gearlean Alf, MD;  Location: [facility];  Service: Orthopedics;  Laterality: Right;  . TOTAL KNEE REVISION Right 09/24/2014   Procedure: RIGHT TOTAL KNEE ARTHROPLASTY REVISION;  Surgeon: Gearlean Alf, MD;  Location: [facility];  Service: Orthopedics;  Laterality: Right;  . TUBAL LIGATION      Current Medications: Outpatient Medications Prior to Visit  Medication Sig Dispense Refill  . acetaminophen (TYLENOL) 325 MG tablet Take 650 mg by mouth every 6 (six) hours as needed for moderate pain (Right  knee pain).    Marland Kitchen albuterol (PROAIR HFA) 108 (90 Base) MCG/ACT inhaler Use 2 puffs every 4 hours  as needed for wheezing 25.5 g 3  . albuterol (PROVENTIL) (2.5 MG/3ML) 0.083% nebulizer solution TAKE 1 VIAL (3 MLS) BY  NEBULIZATION EVERY 4 HOURS  AS NEEDED FOR WHEEZING 36 mL 1  . aspirin 81 MG tablet Take 81 mg by mouth daily.    . citalopram (CELEXA) 20 MG tablet TAKE 1 TABLET BY MOUTH  EVERY MORNING 30 tablet 3  . doxycycline (VIBRA-TABS) 100 MG tablet Take 1 tablet (100 mg total) by mouth 2 (two) times daily. 20 tablet 0  . EPIPEN 2-PAK 0.3 MG/0.3ML SOAJ injection Inject 0.3 mLs (0.3 mg  total) into the muscle as  needed for severe allergic  reaction, and contact  medical provider. 2 Device 0  . fluticasone (FLOVENT HFA) 110 MCG/ACT inhaler Inhale 1 puff into the lungs 2 (two) times daily. 1 Inhaler 5  . furosemide (LASIX) 20 MG tablet Take 1 tablet by mouth  every morning 90 tablet 3  . glipiZIDE (GLUCOTROL XL) 10 MG 24 hr tablet Take 1 tablet by mouth  daily with breakfast 90 tablet 1  . glucose blood test strip Use for blood sugar testing 1-3 times daily. 100 each 11  . HYDROcodone-acetaminophen (NORCO/VICODIN) 5-325 MG per tablet Take 1-2 tablets by mouth every 4 (four) hours as needed for moderate pain. 100 tablet 0  . Lancets MISC Use with blood sugar monitor. 100 each 11  . levothyroxine (SYNTHROID, LEVOTHROID) 75 MCG tablet Take 1 tablet (75 mcg total) by mouth daily before breakfast. Order number [id number]. Needs thyroid check. 30 tablet 2  . losartan (COZAAR) 25 MG tablet Take 1 tablet (25 mg total) by mouth daily. 90 tablet 3  . metFORMIN (GLUCOPHAGE) 500 MG tablet Take 2 tablets (1,000 mg total) by mouth 2 (two) times daily with a meal. 360 tablet 3  . methocarbamol (ROBAXIN) 500 MG tablet Take 1 tablet (500 mg total) by mouth every 6 (six) hours as needed for muscle spasms. 80 tablet 0  . Omega-3 Fatty Acids (FISH OIL) 1000 MG CAPS Take 1 capsule by mouth every morning. Reported on  02/11/2016    . omeprazole (PRILOSEC) 20 MG capsule Take 1 capsule by mouth  twice daily 180 capsule 1  . ondansetron (ZOFRAN) 4 MG tablet Take 1 tablet (4 mg total) by mouth every 8 (eight) hours as needed for nausea. 20 tablet 0  . polyethylene glycol powder (GLYCOLAX/MIRALAX) powder TAKE 17 G BY MOUTH 2 (TWO) TIMES DAILY AS NEEDED FOR LAXATIVE 1054 g 0  . potassium chloride SA (K-DUR,KLOR-CON) 20 MEQ tablet Take 1 tablet by mouth  daily 90 tablet 1  . predniSONE (DELTASONE) 50 MG tablet Take 1 tablet (50 mg total) by mouth daily. With Food 5 tablet 0  . propranolol (INDERAL) 40 MG tablet Take 1 tablet by mouth  daily 90 tablet 1  . rosuvastatin (CRESTOR) 20  MG tablet Take 1 tablet by mouth  daily 90 tablet 1  . sitaGLIPtin (JANUVIA) 100 MG tablet Take 1 tablet by mouth  every morning 90 tablet 3  . Travoprost, BAK Free, (TRAVATAN) 0.004 % SOLN ophthalmic solution Place 1 drop into both eyes at bedtime. 1 Bottle 5  . traZODone (DESYREL) 50 MG tablet Take 0.5 tablets (25 mg total) by mouth at bedtime. 30 tablet 3  . triamcinolone cream (KENALOG) 0.5 % APPLY TOPICALLY TO ITCHY AREAS TWICE DAILY AS NEEDED FOR ITCHING UNTIL 2 TO 3 DAYS AFTER HEALED; Order number [id number] 15 g 0   No facility-administered medications prior to visit.      Allergies:   Chlorhexidine; Naproxen; Pregabalin; Propoxyphene n-acetaminophen; Statins; Amitriptyline; Benazepril; Propoxyphene; Amitriptyline hcl; Benazepril hcl; Gabapentin; and Tylox [oxycodone-acetaminophen]   Social History   Social History  . Marital status: Divorced    Spouse name: N/A  . Number of children: N/A  . Years of education: N/A   Social History Main Topics  . Smoking status: Never Smoker  . Smokeless tobacco: Never Used  . Alcohol use No  . Drug use: No  . Sexual activity: No   Other Topics Concern  . None   Social History Narrative  . None     Family History:  The patient's family history includes Cancer in her brother; Heart  failure in her mother and sister; Hypertension in her mother; Kidney disease in her daughter; Stroke in her father.   ROS:   Please see the history of present illness.    ROS All other systems reviewed and are negative.   PHYSICAL EXAM:   VS:  BP 110/70 (BP Location: Left Arm, Patient Position: Sitting, Cuff Size: Normal)   Pulse 80   Ht 5\' 2"  (1.575 m)   Wt 140 lb 3.2 oz (63.6 kg)   BMI 25.64 kg/m    GEN: Well nourished, well developed, in no acute distress HEENT: normal Neck: no JVD, carotid bruits, or masses Cardiac: Paradoxically split S2, RRR; no murmurs, rubs, or gallops,no edema , healthy left subclavian pacemaker site Respiratory:  clear to auscultation bilaterally, normal work of breathing GI: soft, nontender, nondistended, + BS MS: no deformity or atrophy Skin: warm and dry, no rash Neuro:  Alert and Oriented x 3, Strength and sensation are intact Psych: euthymic mood, full affect  Wt Readings from Last 3 Encounters:  04/05/16 140 lb 3.2 oz (63.6 kg)  02/12/16 138 lb (62.6 kg)  02/11/16 138 lb 6.4 oz (62.8 kg)      Studies/Labs Reviewed:   EKG:  EKG is not ordered today.  The ekg ordered today demonstrates A sensed, V paced rhythm  Recent Labs: 04/27/2015: TSH 0.696 09/30/2015: Hemoglobin 12.2; Platelets 218 04/04/2016: ALT 33; BUN 12; Creat 0.84; Potassium 4.6; Sodium 139   Lipid Panel    Component Value Date/Time   CHOL 110 (L) 04/04/2016 1415   TRIG 146 04/04/2016 1415   HDL 42 (L) 04/04/2016 1415   CHOLHDL 2.6 04/04/2016 1415   VLDL 29 04/04/2016 1415   LDLCALC 39 04/04/2016 1415   LDLDIRECT 56 06/08/2012 1120     ASSESSMENT:    1. Complete heart block ([place])   2. (HFpEF) heart failure with preserved ejection fraction ([place])   3. Pacemaker   4. Hyperlipidemia   5. Type 2 diabetes mellitus without complication, without long-term current use of insulin ([place])   6. Essential hypertension      PLAN:  In order of problems listed  above:  1. CHF:  Well compensated on a very low dose of loop diuretic. Excellent blood pressure. Appears euvolemic by clinical exam. NYHA functional class I-II, limited more by orthopedic problems then dyspnea. 2. CHB: She is pacemaker dependent and very sensitive to pacemaker interrogation. She feels very poorly even when she does downloads from home. Prefers to have the device checked in the office every 3 months. 3. PPM: Normal device function. Pacemaker dependent. Office checks every 3 months. 4. HLP: She does not have coronary/vascular disease but does have diabetes mellitus. Target LDL less than 100. Recheck before next office visit. 5. DM: Now with poor control, she is working on improving her diet. Her family issues have really lead to less attention to her diet and activity level and she has gained 12 pounds in the last 9 months. She remains relatively lean, with BMI just over 25. 6. HTN: Good blood pressure control.     Medication Adjustments/Labs and Tests Ordered: Current medicines are reviewed at length with the patient today.  Concerns regarding medicines are outlined above.  Medication changes, Labs and Tests ordered today are listed in the Patient Instructions below. Patient Instructions  Dr Sallyanne Kuster recommends that you schedule a follow-up appointment in 3 months with a pacemaker check.  If you need a refill on your cardiac medications before your next appointment, please call your pharmacy.    Signed, Sanda Klein, MD  04/05/2016 10:41 AM    [facility] [address], [place], [place]  [address] Phone: [phone]; Fax: [phone]

## 2016-04-05 NOTE — Telephone Encounter (Signed)
  Pt A1C 7.8 in June up to 10.2 in August, needs appointment to manage diabetes with PCP.

## 2016-04-06 ENCOUNTER — Other Ambulatory Visit: Payer: Self-pay | Admitting: Family Medicine

## 2016-04-06 DIAGNOSIS — E119 Type 2 diabetes mellitus without complications: Secondary | ICD-10-CM

## 2016-05-02 ENCOUNTER — Other Ambulatory Visit: Payer: Self-pay | Admitting: Pharmacist

## 2016-05-02 NOTE — Patient Outreach (Signed)
Outreach call to [employer] regarding her request for follow up from the [facility] Medication Adherence Campaign. Left a HIPAA compliant message on the patient's voicemail.  Harlow Asa, PharmD Clinical Pharmacist [facility] [phone]

## 2016-05-04 DIAGNOSIS — M1712 Unilateral primary osteoarthritis, left knee: Secondary | ICD-10-CM | POA: Diagnosis not present

## 2016-05-04 DIAGNOSIS — Z96651 Presence of right artificial knee joint: Secondary | ICD-10-CM | POA: Diagnosis not present

## 2016-05-04 DIAGNOSIS — Z471 Aftercare following joint replacement surgery: Secondary | ICD-10-CM | POA: Diagnosis not present

## 2016-05-27 ENCOUNTER — Other Ambulatory Visit: Payer: Self-pay | Admitting: Family Medicine

## 2016-05-27 MED ORDER — ALBUTEROL SULFATE (2.5 MG/3ML) 0.083% IN NEBU: INHALATION_SOLUTION | RESPIRATORY_TRACT | 1 refills | Status: DC

## 2016-05-27 NOTE — Telephone Encounter (Signed)
Pt needs a refill on albuterol nebulizer solution. Please advise. Thanks! ep

## 2016-05-31 ENCOUNTER — Telehealth: Payer: Self-pay | Admitting: *Deleted

## 2016-05-31 NOTE — Telephone Encounter (Signed)
Crystal with [facility] called to check status of fax from ipratropium-albuterol inhaler.  Request for medication for faxed on 05/25/16 and 05/27/16.  Please fax back with progress note of pulmonary diagnosis to [phone].  Derl Barrow, RN

## 2016-06-01 NOTE — Telephone Encounter (Signed)
Tawanna Sat,  I will take care of it in the next two days. Im currently on nights but have clinic on Friday.  Thanks   Michelene [place]

## 2016-06-06 ENCOUNTER — Telehealth: Payer: Self-pay | Admitting: Family Medicine

## 2016-06-06 NOTE — Telephone Encounter (Signed)
[facility] pharmacy still needs the prescription faxed for nebulizer solution. Fax [phone]. Please advise. Thanks! ep

## 2016-06-06 NOTE — Telephone Encounter (Signed)
Will forward to PCP.  Jeneal Vogl L, RN  

## 2016-06-10 ENCOUNTER — Encounter: Payer: Self-pay | Admitting: Family Medicine

## 2016-06-10 ENCOUNTER — Ambulatory Visit (INDEPENDENT_AMBULATORY_CARE_PROVIDER_SITE_OTHER): Payer: Medicare Other | Admitting: Family Medicine

## 2016-06-10 VITALS — BP 110/63 | HR 89 | Temp 98.3°F | Wt 137.0 lb

## 2016-06-10 DIAGNOSIS — Z23 Encounter for immunization: Secondary | ICD-10-CM

## 2016-06-10 MED ORDER — ZOSTER VACCINE LIVE 19400 UNT/0.65ML ~~LOC~~ SUSR: 0.6500 mL | Freq: Once | SUBCUTANEOUS | 0 refills | Status: AC

## 2016-06-10 NOTE — Patient Instructions (Signed)
It was great seeing you today! We have addressed the following issues today  1. We reviewed your medications and you currently do not need any refill.  2. We talked about your A1c and will check that at your next visit. 3. We will follow up on your cardiology and eye doctor visit at our next appointment. 4. I will give you a prescription for the shingle vaccine please follow up with your insurance, if they pay for it take the prescription to your pharmacy 5. You will schedule a visit for your mammogram as well as we discussed 6. I will give you a print out for diabetic diet to help you.     If we did any lab work today, and the results require attention, either me or my nurse will get in touch with you. If everything is normal, you will get a letter in mail. If you don't hear from [place] in two weeks, please give [place] a call. Otherwise, we look forward to seeing you again at your next visit. If you have any questions or concerns before then, please call the clinic at [phone].   Please bring all your medications to every doctors visit   Sign up for My Chart to have easy access to your labs results, and communication with your Primary care physician.     Please check-out at the front desk before leaving the clinic.    Take Care,    Diet Recommendations for Diabetes   Starchy (carb) foods: Bread, rice, pasta, potatoes, corn, cereal, grits, crackers, bagels, muffins, all baked goods.  (Fruits, milk, and yogurt also have carbohydrate, but most of these foods will not spike your blood sugar as the starchy foods will.)  A few fruits do cause high blood sugars; use small portions of bananas (limit to 1/2 at a time), grapes, watermelon, oranges, and most tropical fruits.    Protein foods: Meat, fish, poultry, eggs, dairy foods, and beans such as pinto and kidney beans (beans also provide carbohydrate).   1. Eat at least 3 meals and 1-2 snacks per day. Never go more than 4-5 hours while awake  without eating. Eat breakfast within the first hour of getting up.   2. Limit starchy foods to TWO per meal and ONE per snack. ONE portion of a starchy  food is equal to the following:   - ONE slice of bread (or its equivalent, such as half of a hamburger bun).   - 1/2 cup of a "scoopable" starchy food such as potatoes or rice.   - 15 grams of carbohydrate as shown on food label.  3. Include at every meal: a protein food, a carb food, and vegetables and/or fruit.   - Obtain twice the volume of veg's as protein or carbohydrate foods for both lunch and dinner.   - Fresh or frozen veg's are best.   - Keep frozen veg's on hand for a quick vegetable serving.

## 2016-06-10 NOTE — Progress Notes (Signed)
   Subjective:    Patient ID: Mercedes Barr, female    DOB: [birth year], 70 y.o.   MRN: [id number]   CC: Establishing care and medications refill   HPI: Mercedes Barr is a 70 yo female with a past medical history significant for complete heart block, T2DM, HTN, HLD, Hypothyroidism and COPD who presented today with no specific complaints. Patient was in clinic today for a flu shot, and a review of her medications list. Patient has been doing well had a few losses in her family but has been coping well with it. Patient is trying to "do the right thing" since she found out her A1c was 10.2. Patient only other complaint was her chronic right knee pain ahe has has been managing for years.  Smoking status reviewed   ROS: all other systems were reviewed and are negative other than in the HPI   Past medical history, surgical, family, and social history reviewed and updated in the EMR as appropriate.  Objective:  BP 110/63   Pulse 89   Temp 98.3 F (36.8 C) (Oral)   Wt 62.1 kg (137 lb)   BMI 25.06 kg/m   Vitals and nursing note reviewed  General: NAD, pleasant, able to participate in exam Cardiac: RRR, normal heart sounds, no murmurs. 2+ radial and PT pulses bilaterally Respiratory: CTAB, normal effort, No wheezes, rales or rhonchi Abdomen: soft, nontender, nondistended, no hepatic or splenomegaly, +BS Extremities: no edema or cyanosis. WWP. Skin: warm and dry, no rashes noted Neuro: alert and oriented x4, no focal deficits Psych: Normal affect and mood   Assessment & Plan:  #Medication refill, health maintenance  We reviewed patient med list and she did not require any refills. Patient will make appointment for another visit to discuss other chronic problems. At this time, patient is satisfied with care she is receiving. This was our first encounter. Patient received the flu shot today and received a prescription for the zoster vaccine.   Marjie Skiff, MD Family Medicine  Resident PGY-1

## 2016-06-13 DIAGNOSIS — H2513 Age-related nuclear cataract, bilateral: Secondary | ICD-10-CM | POA: Diagnosis not present

## 2016-06-13 DIAGNOSIS — H401132 Primary open-angle glaucoma, bilateral, moderate stage: Secondary | ICD-10-CM | POA: Diagnosis not present

## 2016-06-13 DIAGNOSIS — E119 Type 2 diabetes mellitus without complications: Secondary | ICD-10-CM | POA: Diagnosis not present

## 2016-06-13 DIAGNOSIS — H524 Presbyopia: Secondary | ICD-10-CM | POA: Diagnosis not present

## 2016-06-13 LAB — HM DIABETES EYE EXAM

## 2016-06-14 ENCOUNTER — Other Ambulatory Visit: Payer: Self-pay | Admitting: Family Medicine

## 2016-06-14 DIAGNOSIS — Z1231 Encounter for screening mammogram for malignant neoplasm of breast: Secondary | ICD-10-CM

## 2016-06-14 MED ORDER — POLYETHYLENE GLYCOL 3350 17 GM/SCOOP PO POWD: ORAL | 0 refills | Status: DC

## 2016-06-14 NOTE — Telephone Encounter (Signed)
Pt states Miralax was suppose to be called in last week,pt's pharmacy does not have it. Please advise. Thanks! ep

## 2016-06-16 ENCOUNTER — Telehealth: Payer: Self-pay | Admitting: Family Medicine

## 2016-06-16 MED ORDER — POLYETHYLENE GLYCOL 3350 17 GM/SCOOP PO POWD: ORAL | 0 refills | Status: DC

## 2016-06-16 NOTE — Addendum Note (Signed)
Addended by: Derl Barrow on: 06/16/2016 02:50 PM   Modules accepted: Orders

## 2016-06-16 NOTE — Telephone Encounter (Signed)
[facility] [address]. Please advise. Thanks! ep

## 2016-06-16 NOTE — Telephone Encounter (Signed)
[facility].  Derl Barrow, RN

## 2016-06-21 ENCOUNTER — Telehealth: Payer: Self-pay | Admitting: Family Medicine

## 2016-06-21 NOTE — Telephone Encounter (Signed)
[employer] says Dr needs to send them authorization for she can get the shiingles vaccine.   [employer] servive  [phone]

## 2016-06-28 ENCOUNTER — Other Ambulatory Visit: Payer: Self-pay | Admitting: *Deleted

## 2016-06-28 DIAGNOSIS — I10 Essential (primary) hypertension: Secondary | ICD-10-CM

## 2016-06-28 MED ORDER — LOSARTAN POTASSIUM 25 MG PO TABS: 25.0000 mg | ORAL_TABLET | Freq: Every day | ORAL | 3 refills | Status: DC

## 2016-06-29 ENCOUNTER — Ambulatory Visit
Admission: RE | Admit: 2016-06-29 | Discharge: 2016-06-29 | Disposition: A | Discharge status: Home / Self Care | Payer: Medicare Other | Source: Ambulatory Visit | Attending: Family Medicine | Admitting: Family Medicine

## 2016-06-29 DIAGNOSIS — Z1231 Encounter for screening mammogram for malignant neoplasm of breast: Secondary | ICD-10-CM | POA: Diagnosis not present

## 2016-07-04 ENCOUNTER — Encounter: Payer: Self-pay | Admitting: *Deleted

## 2016-07-05 ENCOUNTER — Encounter: Payer: Self-pay | Admitting: Cardiovascular Disease

## 2016-07-05 ENCOUNTER — Ambulatory Visit (INDEPENDENT_AMBULATORY_CARE_PROVIDER_SITE_OTHER): Payer: Medicare Other | Admitting: Cardiovascular Disease

## 2016-07-05 VITALS — BP 116/72 | HR 86 | Ht 62.0 in | Wt 136.4 lb

## 2016-07-05 DIAGNOSIS — E7800 Pure hypercholesterolemia, unspecified: Secondary | ICD-10-CM

## 2016-07-05 DIAGNOSIS — I5030 Unspecified diastolic (congestive) heart failure: Secondary | ICD-10-CM

## 2016-07-05 DIAGNOSIS — I442 Atrioventricular block, complete: Secondary | ICD-10-CM | POA: Diagnosis not present

## 2016-07-05 DIAGNOSIS — I10 Essential (primary) hypertension: Secondary | ICD-10-CM

## 2016-07-05 DIAGNOSIS — E119 Type 2 diabetes mellitus without complications: Secondary | ICD-10-CM

## 2016-07-05 DIAGNOSIS — Z950 Presence of cardiac pacemaker: Secondary | ICD-10-CM

## 2016-07-05 LAB — CUP PACEART INCLINIC DEVICE CHECK
Battery Impedance: 3415 Ohm
Battery Voltage: 2.67 V
Brady Statistic AP VP Percent: 94.7 %
Brady Statistic AP VS Percent: 0.1 % — CL
Brady Statistic AS VP Percent: 5.3 %
Implantable Lead Location: 753860
Implantable Lead Model: 5068
Implantable Lead Model: 5092
Lead Channel Impedance Value: 724 Ohm
Lead Channel Pacing Threshold Amplitude: 0.5 V
Lead Channel Pacing Threshold Amplitude: 1.25 V
Lead Channel Sensing Intrinsic Amplitude: 1.4 mV
Lead Channel Setting Pacing Amplitude: 2.5 V
Lead Channel Setting Pacing Pulse Width: 0.4 ms
MDC IDC LEAD IMPLANT DT: 19990203
MDC IDC LEAD IMPLANT DT: 19990203
MDC IDC LEAD LOCATION: 753859
MDC IDC MSMT BATTERY REMAINING LONGEVITY: 12 mo
MDC IDC MSMT LEADCHNL RA PACING THRESHOLD PULSEWIDTH: 0.4 ms
MDC IDC MSMT LEADCHNL RV IMPEDANCE VALUE: 689 Ohm
MDC IDC MSMT LEADCHNL RV PACING THRESHOLD PULSEWIDTH: 0.4 ms
MDC IDC PG IMPLANT DT: 20081114
MDC IDC SESS DTM: 20171128102655
MDC IDC SET LEADCHNL RA PACING AMPLITUDE: 2 V
MDC IDC SET LEADCHNL RV SENSING SENSITIVITY: 2.8 mV
MDC IDC STAT BRADY AS VS PERCENT: 0.1 % — AB

## 2016-07-05 NOTE — Patient Instructions (Signed)
Dr Croitoru recommends that you schedule a follow-up appointment in 3 months with a pacemaker check.  If you need a refill on your cardiac medications before your next appointment, please call your pharmacy. 

## 2016-07-05 NOTE — Progress Notes (Signed)
Patient ID: Mercedes Barr, female   DOB: May 17, 1946, 70 y.o.   MRN: [id number]    Cardiology Office Note    Date:  07/05/2016   ID:  Mercedes Barr, DOB 1946-07-18, MRN [id number]  PCP:  Marjie Skiff, MD  Cardiologist:   Sanda Klein, MD   Chief Complaint  Patient presents with  . Follow-up    no chest pain, no shortness of breath, no edema, no pain or cramping in legs-has bad knee, no lightheaded or dizziness    History of Present Illness:  Mercedes Barr is a 70 y.o. female with complete heart block, pacemaker dependent, history of endocardial cushion defect repair, heart failure with preserved left ventricular systolic function.  She Is doing much better than at her last appointment. Emotionally she is in a better place. She is paying more attention to her diet has lost weight and is seeing improvement in her glucose levels. Her BMI is now less than 25.  She denies orthopnea or PND and has not had palpitations, syncope, chest discomfort on exertion, neurological complaints. As always when we interrogate her pacemaker she has a brief sensation of near syncope that she finds very disturbing. She therefore does not want to do home downloads since even the brief magnet mode switch makes her feel unwell.  Pacemaker interrogation shows roughly 12 months of generator longevity (range 1-26 months). There is 95% atrial pacing with good heart rate histogram distribution and 100% ventricular pacing. Occasional very brief episodes of mode switch have been recorded. There is no evidence of atrial fibrillation.  She had surgical repair of atrioventricular cushion defect repair in 1988 when she was 70 years old. Surgery was complicated by development of complete heart block and she received a pacemaker (her initial device probably was an abdominal generator with epicardial leads). A right subclavian pacemaker was placed in 1991, but had to be removed because of complaints of pain. She had a  complete revision of the system in 1999 and her current left subclavian atrial and ventricular leads date to that time. Her most recent generator change out occurred in 2008. The atrial lead is a Medtronic [id number]. The ventricular lead is Medtronic [id number]. The current generator is a Medtronic Adapta.  Past Medical History:  Diagnosis Date  . 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 Asthma   . Complete heart block ([place])   . COPD (chronic obstructive pulmonary disease) ([place])   . Coronary artery disease   . Diabetes mellitus   . GERD (gastroesophageal reflux disease)   . Heart murmur   . Hypertension   . Hypothyroidism   . Normal coronary arteries 2011  . Pacemaker    PACEMAKER DEPENDENT-DR. El [place].  OFFICE NOTE DR. A. LITTLE STATES  "EXTREMELY PACEMAKER SENSITIVE AND WHEN YOU TRY TO CHECK FOR UNDERLYING RHYTHMS SHE WILL HAVE SNYCOPE AND WE HAVE NOT DONE THIS NOW IN ABOUT 2 YEARS".  . Shortness of breath dyspnea    walking distance or climbing stairs    Past Surgical History:  Procedure Laterality Date  . ATRIOVENTRICULAR CUSHION DEFECT REPAIR  1988  . CARDIAC CATHETERIZATION  2011    SHOWED NO CAD-PER CARDIOLOGY OFFICE NOTES DR. A. LITTLE  . colonscopy      removed polyps   . INSERT / REPLACE / REMOVE PACEMAKER    . PACEMAKER INSERTION  1988   last gen 11/08- MDT  . TOTAL KNEE ARTHROPLASTY Right 11/19/2012   Procedure: RIGHT  TOTAL KNEE ARTHROPLASTY;  Surgeon: Gearlean Alf, MD;  Location: [facility];  Service: Orthopedics;  Laterality: Right;  . TOTAL KNEE REVISION Right 09/24/2014   Procedure: RIGHT TOTAL KNEE ARTHROPLASTY REVISION;  Surgeon: Gearlean Alf, MD;  Location: [facility];  Service: Orthopedics;  Laterality: Right;  . TUBAL LIGATION      Current Medications: Outpatient Medications Prior to Visit  Medication Sig Dispense Refill  . acetaminophen (TYLENOL) 325 MG tablet Take 650 mg by mouth every 6 (six) hours as needed for moderate pain (Right knee  pain).    Marland Kitchen albuterol (PROAIR HFA) 108 (90 Base) MCG/ACT inhaler Use 2 puffs every 4 hours  as needed for wheezing 25.5 g 3  . albuterol (PROVENTIL) (2.5 MG/3ML) 0.083% nebulizer solution TAKE 1 VIAL (3 MLS) BY  NEBULIZATION EVERY 4 HOURS  AS NEEDED FOR WHEEZING 36 mL 1  . aspirin 81 MG tablet Take 81 mg by mouth daily.    . citalopram (CELEXA) 20 MG tablet TAKE 1 TABLET BY MOUTH  EVERY MORNING 30 tablet 3  . EPIPEN 2-PAK 0.3 MG/0.3ML SOAJ injection Inject 0.3 mLs (0.3 mg  total) into the muscle as  needed for severe allergic  reaction, and contact  medical provider. 2 Device 0  . fluticasone (FLOVENT HFA) 110 MCG/ACT inhaler Inhale 1 puff into the lungs 2 (two) times daily. 1 Inhaler 5  . furosemide (LASIX) 20 MG tablet Take 1 tablet by mouth  every morning 90 tablet 3  . GLIPIZIDE XL 10 MG 24 hr tablet Take 1 tablet by mouth  daily with breakfast 90 tablet 4  . glucose blood test strip Use for blood sugar testing 1-3 times daily. 100 each 11  . JANUVIA 100 MG tablet Take 1 tablet by mouth  every morning 90 tablet 4  . Lancets MISC Use with blood sugar monitor. 100 each 11  . levothyroxine (SYNTHROID, LEVOTHROID) 75 MCG tablet Take 1 tablet by mouth  daily before breakfast 90 tablet 4  . losartan (COZAAR) 25 MG tablet Take 1 tablet (25 mg total) by mouth daily. 90 tablet 3  . metFORMIN (GLUCOPHAGE) 500 MG tablet TAKE 2 TABLETS BY MOUTH 2  TIMES DAILY WITH MEALS. 360 tablet 4  . methocarbamol (ROBAXIN) 500 MG tablet Take 1 tablet (500 mg total) by mouth every 6 (six) hours as needed for muscle spasms. 80 tablet 0  . Omega-3 Fatty Acids (FISH OIL) 1000 MG CAPS Take 1 capsule by mouth every morning. Reported on 02/11/2016    . omeprazole (PRILOSEC) 20 MG capsule Take 1 capsule by mouth  twice daily 180 capsule 1  . ondansetron (ZOFRAN) 4 MG tablet Take 1 tablet (4 mg total) by mouth every 8 (eight) hours as needed for nausea. 20 tablet 0  . polyethylene glycol powder (GLYCOLAX/MIRALAX) powder TAKE 17  G BY MOUTH 2 (TWO) TIMES DAILY AS NEEDED FOR LAXATIVE 1054 g 0  . potassium chloride SA (K-DUR,KLOR-CON) 20 MEQ tablet Take 1 tablet by mouth  daily 90 tablet 1  . propranolol (INDERAL) 40 MG tablet Take 1 tablet by mouth  daily 90 tablet 1  . rosuvastatin (CRESTOR) 20 MG tablet Take 1 tablet by mouth  daily 90 tablet 4  . Travoprost, BAK Free, (TRAVATAN) 0.004 % SOLN ophthalmic solution Place 1 drop into both eyes at bedtime. 1 Bottle 5  . traZODone (DESYREL) 50 MG tablet Take 0.5 tablets (25 mg total) by mouth at bedtime. 30 tablet 3  . triamcinolone cream (KENALOG) 0.5 %  APPLY TOPICALLY TO ITCHY AREAS TWICE DAILY AS NEEDED FOR ITCHING UNTIL 2 TO 3 DAYS AFTER HEALED; Order number [id number] 15 g 0   No facility-administered medications prior to visit.      Allergies:   Chlorhexidine; Naproxen; Pregabalin; Propoxyphene n-acetaminophen; Statins; Amitriptyline; Benazepril; Oxycodone-acetaminophen; Propoxyphene; Amitriptyline hcl; Benazepril hcl; Gabapentin; and Tylox [oxycodone-acetaminophen]   Social History   Social History  . Marital status: Divorced    Spouse name: N/A  . Number of children: N/A  . Years of education: N/A   Social History Main Topics  . Smoking status: Never Smoker  . Smokeless tobacco: Never Used  . Alcohol use No  . Drug use: No  . Sexual activity: No   Other Topics Concern  . None   Social History Narrative  . None     Family History:  The patient's family history includes Cancer in her brother; Heart failure in her mother and sister; Hypertension in her mother; Kidney disease in her daughter; Stroke in her father.   ROS:   Please see the history of present illness.    ROS All other systems reviewed and are negative.   PHYSICAL EXAM:   VS:  BP 116/72 (BP Location: Right Arm, Patient Position: Sitting, Cuff Size: Normal)   Pulse 86   Ht 5\' 2"  (1.575 m)   Wt 136 lb 6 oz (61.9 kg)   BMI 24.94 kg/m    GEN: Well nourished, well developed, in no acute  distress  HEENT: normal  Neck: no JVD, carotid bruits, or masses Cardiac: Paradoxically split S2, RRR; no murmurs, rubs, or gallops,no edema , healthy left subclavian pacemaker site Respiratory:  clear to auscultation bilaterally, normal work of breathing GI: soft, nontender, nondistended, + BS MS: no deformity or atrophy  Skin: warm and dry, no rash Neuro:  Alert and Oriented x 3, Strength and sensation are intact Psych: euthymic mood, full affect  Wt Readings from Last 3 Encounters:  07/05/16 136 lb 6 oz (61.9 kg)  06/10/16 137 lb (62.1 kg)  04/05/16 140 lb 3.2 oz (63.6 kg)      Studies/Labs Reviewed:   EKG:  EKG is not ordered today.  The intraardiac electrogram today demonstrates A paced, V paced rhythm  Recent Labs: 09/30/2015: Hemoglobin 12.2; Platelets 218 04/04/2016: ALT 33; BUN 12; Creat 0.84; Potassium 4.6; Sodium 139   Lipid Panel    Component Value Date/Time   CHOL 110 (L) 04/04/2016 1415   TRIG 146 04/04/2016 1415   HDL 42 (L) 04/04/2016 1415   CHOLHDL 2.6 04/04/2016 1415   VLDL 29 04/04/2016 1415   LDLCALC 39 04/04/2016 1415   LDLDIRECT 56 06/08/2012 1120     ASSESSMENT:    1. (HFpEF) heart failure with preserved ejection fraction ([place])   2. Complete heart block ([place])   3. Pacemaker   4. Pure hypercholesterolemia   5. Type 2 diabetes mellitus without complication, without long-term current use of insulin ([place])   6. Essential hypertension      PLAN:  In order of problems listed above:  1. CHF: Well compensated on a very low dose of loop diuretic. Excellent blood pressure. Appears euvolemic by clinical exam. NYHA functional class I-II. 2. CHB: She is pacemaker dependent and very sensitive to pacemaker interrogation. She feels very poorly even when she does downloads from home. Prefers to have the device checked in the office every 3 months. 3. PPM: Normal device function. Pacemaker dependent. Office checks every 3 months. Increase frequency to monthly  in the last 3 months of anticipated generator longevity. 4. HLP: She does not have coronary/vascular disease but does have diabetes mellitus. LDL well within target range. 5. DM: Approved as she is working on improving her diet. She remains relatively lean, with BMI now under 25. 6. HTN: Good blood pressure control.     Medication Adjustments/Labs and Tests Ordered: Current medicines are reviewed at length with the patient today.  Concerns regarding medicines are outlined above.  Medication changes, Labs and Tests ordered today are listed in the Patient Instructions below. Patient Instructions  Dr Sallyanne Kuster recommends that you schedule a follow-up appointment in 3 months with a pacemaker check.  If you need a refill on your cardiac medications before your next appointment, please call your pharmacy.    Signed, Sanda Klein, MD  07/05/2016 3:44 PM    [address], [place], [place]  [address] Phone: [phone]; Fax: [phone]

## 2016-07-11 ENCOUNTER — Ambulatory Visit (INDEPENDENT_AMBULATORY_CARE_PROVIDER_SITE_OTHER): Payer: Medicare Other | Admitting: Family Medicine

## 2016-07-11 ENCOUNTER — Encounter: Payer: Self-pay | Admitting: Family Medicine

## 2016-07-11 VITALS — BP 129/56 | HR 79 | Temp 98.0°F | Ht 62.0 in | Wt 138.6 lb

## 2016-07-11 DIAGNOSIS — E119 Type 2 diabetes mellitus without complications: Secondary | ICD-10-CM

## 2016-07-11 LAB — POCT GLYCOSYLATED HEMOGLOBIN (HGB A1C): Hemoglobin A1C: 8.8

## 2016-07-11 NOTE — Progress Notes (Signed)
   Subjective:    Patient ID: Mercedes Barr, female    DOB: [birth year], 70 y.o.   MRN: [id number]   CC: Follow up on diabetes management  HPI: Mercedes Barr is a 70 yo female with a past medical history significant for complete heart block, T2DM, HTN, HLD, Hypothyroidism and COPD who presented for follow up on diabetic management and more specifically Hemoglobin A1c result. Patient endorse lifestyle changes since last visit, mostly centered around modifying her diet cutting down on sweets and other carbohydrates. Patient has been compliant with medication regimen. She was recently seen by her cardiologist with no acute finding and stable pathology. Mammogram was also negative for evidence of malignancy. Patient complained of right knee pain that is fairly well controlled at the moment. She has had a right knee replacement a few years ago. Patient also lost her daughter around the holidays season many years ago (1991) and it is always a time for remembrance for her, but has been coping well with it.   Smoking status reviewed   ROS: all other systems were reviewed and are negative other than in the HPI   Past medical history, surgical, family, and social history reviewed and updated in the EMR as appropriate.  Objective:  BP (!) 129/56 (BP Location: Left Arm, Patient Position: Sitting, Cuff Size: Normal)   Pulse 79   Temp 98 F (36.7 C) (Oral)   Ht 5\' 2"  (1.575 m)   Wt 138 lb 9.6 oz (62.9 kg)   SpO2 100%   BMI 25.35 kg/m   Vitals and nursing note reviewed  General: NAD, pleasant, able to participate in exam Cardiac: RRR, normal heart sounds, no murmurs. 2+ radial and PT pulses bilaterally Respiratory: CTAB, normal effort, No wheezes, rales or rhonchi Abdomen: soft, nontender, nondistended, no hepatic or splenomegaly, +BS Extremities: no edema or cyanosis. WWP. Skin: warm and dry, no rashes noted Neuro: alert and oriented x4, no focal deficits Psych: Normal affect and  mood   Assessment & Plan:    #T2DM, chronic, improving Patient last A1c on 8/29 was 10.2, patient was recently going through a few family loss and admitted to dietary indiscretions though she was adherent to medications regimen. Since last visit in early November, patient has made many changes in her diet and has been cutting out sweets and carbs. Today, hemoglobin A1c of 8.8 reflects those changes. Patient and I had a long discussion and she will continue with current lifestyle choice with a goal to return into the 7's range for A1c and achievable goal since she had an A1c of 7.1 a year ago (07/2015). --Patient was given DM diet today from nutrition and will follow recommendations in addition to own diet changes. --Continue current medication regimen of Metformin, Januvia, Glipizide   #Zoster vaccine Patient was given prescription for shingles vaccine.    Mercedes Skiff, MD Family Medicine Resident PGY-1

## 2016-07-11 NOTE — Patient Instructions (Signed)
It was great seeing you today! We have addressed the following issues today  1. We talked about your your hemoglobin A1c which has improved from 10.2 to 8.8. Please continue to pay attention to your diet and continue your current medication regimen. 2. I wrote a prescription for the shingle vaccine, you can take it to your local pharmacy and get it filled. Let me know if you have any issues with the prescription.    If we did any lab work today, and the results require attention, either me or my nurse will get in touch with you. If everything is normal, you will get a letter in mail. If you don't hear from [place] in two weeks, please give [place] a call. Otherwise, we look forward to seeing you again at your next visit. If you have any questions or concerns before then, please call the clinic at [phone].   Please bring all your medications to every doctors visit   Sign up for My Chart to have easy access to your labs results, and communication with your Primary care physician.     Please check-out at the front desk before leaving the clinic.    Take Care,    Diet Recommendations for Diabetes   Starchy (carb) foods: Bread, rice, pasta, potatoes, corn, cereal, grits, crackers, bagels, muffins, all baked goods.  (Fruits, milk, and yogurt also have carbohydrate, but most of these foods will not spike your blood sugar as the starchy foods will.)  A few fruits do cause high blood sugars; use small portions of bananas (limit to 1/2 at a time), grapes, watermelon, oranges, and most tropical fruits.    Protein foods: Meat, fish, poultry, eggs, dairy foods, and beans such as pinto and kidney beans (beans also provide carbohydrate).   1. Eat at least 3 meals and 1-2 snacks per day. Never go more than 4-5 hours while awake without eating. Eat breakfast within the first hour of getting up.   2. Limit starchy foods to TWO per meal and ONE per snack. ONE portion of a starchy  food is equal to the  following:   - ONE slice of bread (or its equivalent, such as half of a hamburger bun).   - 1/2 cup of a "scoopable" starchy food such as potatoes or rice.   - 15 grams of carbohydrate as shown on food label.  3. Include at every meal: a protein food, a carb food, and vegetables and/or fruit.   - Obtain twice the volume of veg's as protein or carbohydrate foods for both lunch and dinner.   - Fresh or frozen veg's are best.   - Keep frozen veg's on hand for a quick vegetable serving.

## 2016-07-15 ENCOUNTER — Telehealth: Payer: Self-pay | Admitting: Family Medicine

## 2016-07-15 NOTE — Telephone Encounter (Signed)
Insurance does not cover shingles shot, so pt will not be getting. Please advise. Thanks! ep

## 2016-07-15 NOTE — Telephone Encounter (Signed)
Will discuss with patient at next visit. The shingle vaccine is not a priority for her at the moment. Please inform patient.   Thank you

## 2016-07-18 NOTE — Telephone Encounter (Signed)
Pt informed and voiced understanding

## 2016-07-22 ENCOUNTER — Other Ambulatory Visit: Payer: Self-pay | Admitting: Family Medicine

## 2016-07-22 DIAGNOSIS — I10 Essential (primary) hypertension: Secondary | ICD-10-CM

## 2016-07-22 DIAGNOSIS — I5030 Unspecified diastolic (congestive) heart failure: Secondary | ICD-10-CM

## 2016-09-19 DIAGNOSIS — H401132 Primary open-angle glaucoma, bilateral, moderate stage: Secondary | ICD-10-CM | POA: Diagnosis not present

## 2016-09-29 ENCOUNTER — Other Ambulatory Visit: Payer: Self-pay | Admitting: *Deleted

## 2016-09-29 MED ORDER — CITALOPRAM HYDROBROMIDE 20 MG PO TABS: 20.0000 mg | ORAL_TABLET | Freq: Every morning | ORAL | 3 refills | Status: DC

## 2016-09-29 MED ORDER — POTASSIUM CHLORIDE CRYS ER 20 MEQ PO TBCR
20.0000 meq | EXTENDED_RELEASE_TABLET | Freq: Every day | ORAL | 1 refills | Status: DC
Start: 2016-09-29 — End: 2017-04-25

## 2016-09-30 ENCOUNTER — Encounter: Payer: Self-pay | Admitting: Family Medicine

## 2016-09-30 ENCOUNTER — Ambulatory Visit (INDEPENDENT_AMBULATORY_CARE_PROVIDER_SITE_OTHER): Payer: Medicare Other | Admitting: Family Medicine

## 2016-09-30 VITALS — BP 110/58 | HR 76 | Temp 98.3°F | Ht 62.0 in | Wt 138.6 lb

## 2016-09-30 DIAGNOSIS — Z0000 Encounter for general adult medical examination without abnormal findings: Secondary | ICD-10-CM | POA: Diagnosis not present

## 2016-09-30 NOTE — Progress Notes (Signed)
   Subjective:    Patient ID: Mercedes Barr, female    DOB: [birth year], 71 y.o.   MRN: [id number]   CC: Routine Health Visit  HPI: Mercedes Barr is a 71 yo female with a past medical history significant for complete heart block, T2DM, HTN, HLD, Hypothyroidism and COPD who presented today for routine 3 months health check up. Patient does not have any specific health concerns at the moment. She has been doing well since last visit and has started working out. Patient has been taking her medications as prescribed with no side effects and continue to tolerate them well. Patient reports recently seeing her "eye doctor' with no concerns at the moment.  Smoking status reviewed   ROS: all other systems were reviewed and are negative other than in the HPI   Past medical history, surgical, family, and social history reviewed and updated in the EMR as appropriate.  Objective:  BP (!) 110/58   Pulse 76   Temp 98.3 F (36.8 C) (Oral)   Ht 5\' 2"  (1.575 m)   Wt 138 lb 9.6 oz (62.9 kg)   SpO2 95%   BMI 25.35 kg/m   Vitals and nursing note reviewed  General: NAD, pleasant, able to participate in exam Cardiac: RRR, normal heart sounds, no murmurs. 2+ radial and PT pulses bilaterally Respiratory: CTAB, normal effort, No wheezes, rales or rhonchi Abdomen: soft, nontender, nondistended, no hepatic or splenomegaly, +BS Extremities: no edema or cyanosis. WWP. Skin: warm and dry, no rashes noted Neuro: alert and oriented x4, no focal deficits Psych: Normal affect and mood   Assessment & Plan:   #Routine health maintenance Patient is currently in good health with no particular medical concerns. Patient is adherent to medication regimen and has a routine 3 month follow up with cardiology on  2/28. Patient is due for A1c in about 30 days. Patient is exercising and careful with her diet. Patient is disciplined and has good understanding of her medical history and current management plan. BP is within  normal lipid at today's visit and rest of the vitals signs are not concerning. --Will see patient in about a month for DM management, A1c will be due --Will refill medications as needed  --Continue to exercise and follow a DM diet    Marjie Skiff, MD Family Medicine Resident PGY-1

## 2016-09-30 NOTE — Patient Instructions (Addendum)
It was great seeing you today! We have addressed the following issues today  1. As we discussed today, you are doing well with no complaints at the moment. You are taking your medications and have been following a good diet. You also have started working out which will contribute to your overall well being and good health. Please make an appointment next so we can see each other in March and check your A1c and discuss your cardiology visit as well.   If we did any lab work today, and the results require attention, either me or my nurse will get in touch with you. If everything is normal, you will get a letter in mail and a message via . If you don't hear from [place] in two weeks, please give [place] a call. Otherwise, we look forward to seeing you again at your next visit. If you have any questions or concerns before then, please call the clinic at [phone].  Please bring all your medications to every doctors visit  Sign up for My Chart to have easy access to your labs results, and communication with your Primary care physician.    Please check-out at the front desk before leaving the clinic.    Take Care,   Dr. Andy Gauss

## 2016-10-05 ENCOUNTER — Other Ambulatory Visit: Payer: Self-pay | Admitting: Family Medicine

## 2016-10-05 ENCOUNTER — Ambulatory Visit (INDEPENDENT_AMBULATORY_CARE_PROVIDER_SITE_OTHER): Payer: Medicare Other | Admitting: Cardiovascular Disease

## 2016-10-05 VITALS — BP 100/68 | HR 92 | Ht 60.0 in | Wt 138.0 lb

## 2016-10-05 DIAGNOSIS — E119 Type 2 diabetes mellitus without complications: Secondary | ICD-10-CM | POA: Diagnosis not present

## 2016-10-05 DIAGNOSIS — E7800 Pure hypercholesterolemia, unspecified: Secondary | ICD-10-CM

## 2016-10-05 DIAGNOSIS — Z950 Presence of cardiac pacemaker: Secondary | ICD-10-CM | POA: Diagnosis not present

## 2016-10-05 DIAGNOSIS — I442 Atrioventricular block, complete: Secondary | ICD-10-CM | POA: Diagnosis not present

## 2016-10-05 DIAGNOSIS — I10 Essential (primary) hypertension: Secondary | ICD-10-CM

## 2016-10-05 DIAGNOSIS — I5030 Unspecified diastolic (congestive) heart failure: Secondary | ICD-10-CM

## 2016-10-05 LAB — CUP PACEART INCLINIC DEVICE CHECK
Battery Impedance: 4205 Ohm
Battery Voltage: 2.63 V
Date Time Interrogation Session: 20180228152837
Implantable Lead Implant Date: 19990203
Implantable Lead Location: 753859
Implantable Lead Location: 753860
Implantable Lead Model: 5068
Implantable Pulse Generator Implant Date: 20081114
Lead Channel Impedance Value: 742 Ohm
Lead Channel Pacing Threshold Amplitude: 0.875 V
Lead Channel Pacing Threshold Pulse Width: 0.4 ms
Lead Channel Pacing Threshold Pulse Width: 0.4 ms
Lead Channel Sensing Intrinsic Amplitude: 1.4 mV
Lead Channel Setting Pacing Amplitude: 2 V
MDC IDC LEAD IMPLANT DT: 19990203
MDC IDC MSMT BATTERY REMAINING LONGEVITY: 8 mo
MDC IDC MSMT LEADCHNL RA PACING THRESHOLD AMPLITUDE: 1 V
MDC IDC MSMT LEADCHNL RA PACING THRESHOLD PULSEWIDTH: 0.4 ms
MDC IDC MSMT LEADCHNL RV IMPEDANCE VALUE: 624 Ohm
MDC IDC MSMT LEADCHNL RV PACING THRESHOLD AMPLITUDE: 0.625 V
MDC IDC MSMT LEADCHNL RV PACING THRESHOLD AMPLITUDE: 0.75 V
MDC IDC MSMT LEADCHNL RV PACING THRESHOLD PULSEWIDTH: 0.4 ms
MDC IDC MSMT LEADCHNL RV SENSING INTR AMPL: 5.6 mV
MDC IDC SET LEADCHNL RV PACING AMPLITUDE: 2.5 V
MDC IDC SET LEADCHNL RV PACING PULSEWIDTH: 0.4 ms
MDC IDC SET LEADCHNL RV SENSING SENSITIVITY: 2.8 mV
MDC IDC STAT BRADY AP VP PERCENT: 88 %
MDC IDC STAT BRADY AP VS PERCENT: 0 %
MDC IDC STAT BRADY AS VP PERCENT: 12 %
MDC IDC STAT BRADY AS VS PERCENT: 0 %

## 2016-10-05 MED ORDER — POLYETHYLENE GLYCOL 3350 17 GM/SCOOP PO POWD: ORAL | 0 refills | Status: DC

## 2016-10-05 NOTE — Patient Instructions (Signed)
Dr Croitoru recommends that you schedule a follow-up appointment in 3 months with a pacemaker check.  If you need a refill on your cardiac medications before your next appointment, please call your pharmacy. 

## 2016-10-05 NOTE — Telephone Encounter (Signed)
Pt needs a refill on miralax. [facility] uses [facility]

## 2016-10-05 NOTE — Progress Notes (Signed)
Patient ID: Mercedes Barr, female   DOB: 07/08/1946, 71 y.o.   MRN: [id number]    Cardiology Office Note    Date:  10/06/2016   ID:  Mercedes Barr, DOB February 23, 1946, MRN [id number]  PCP:  Marjie Skiff, MD  Cardiologist:   Sanda Klein, MD   Chief Complaint  Patient presents with  . Follow-up    History of Present Illness:  Mercedes Barr is a 71 y.o. female with complete heart block, pacemaker dependent, history of endocardial cushion defect repair, heart failure with preserved left ventricular systolic function.  She is feeling well. She denies angina, dyspnea, palpitations or syncope. As always when we interrogate her pacemaker she has a brief sensation of near syncope that she finds very disturbing. She therefore does not want to do home downloads since even the brief magnet mode switch makes her feel unwell.  Pacemaker interrogation shows roughly 8 months of generator longevity (range 1-19 months). There is 88% atrial pacing with good heart rate histogram distribution and 100% ventricular pacing. Very rare episodes of mode switch have been recorded. There is no evidence of atrial fibrillation or VT.  She had surgical repair of atrioventricular cushion defect repair in 1988 when she was 71 years old. Surgery was complicated by development of complete heart block and she received a pacemaker (her initial device probably was an abdominal generator with epicardial leads). A right subclavian pacemaker was placed in 1991, but had to be removed because of complaints of pain. She had a complete revision of the system in 1999 and her current left subclavian atrial and ventricular leads date to that time. Her most recent generator change out occurred in 2008. The atrial lead is a Medtronic [id number]. The ventricular lead is Medtronic [id number]. The current generator is a Medtronic Adapta.  Past Medical History:  Diagnosis Date  . 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 Asthma   . Complete heart block ([place])   .  COPD (chronic obstructive pulmonary disease) ([place])   . Coronary artery disease   . Diabetes mellitus   . GERD (gastroesophageal reflux disease)   . Heart murmur   . Hypertension   . Hypothyroidism   . Normal coronary arteries 2011  . Pacemaker    PACEMAKER DEPENDENT-DR. Morton.  OFFICE NOTE DR. A. LITTLE STATES  "EXTREMELY PACEMAKER SENSITIVE AND WHEN YOU TRY TO CHECK FOR UNDERLYING RHYTHMS SHE WILL HAVE SNYCOPE AND WE HAVE NOT DONE THIS NOW IN ABOUT 2 YEARS".  . Shortness of breath dyspnea    walking distance or climbing stairs    Past Surgical History:  Procedure Laterality Date  . ATRIOVENTRICULAR CUSHION DEFECT REPAIR  1988  . CARDIAC CATHETERIZATION  2011    SHOWED NO CAD-PER CARDIOLOGY OFFICE NOTES DR. A. LITTLE  . colonscopy      removed polyps   . INSERT / REPLACE / REMOVE PACEMAKER    . PACEMAKER INSERTION  1988   last gen 11/08- MDT  . TOTAL KNEE ARTHROPLASTY Right 11/19/2012   Procedure: RIGHT TOTAL KNEE ARTHROPLASTY;  Surgeon: Gearlean Alf, MD;  Location: [facility];  Service: Orthopedics;  Laterality: Right;  . TOTAL KNEE REVISION Right 09/24/2014   Procedure: RIGHT TOTAL KNEE ARTHROPLASTY REVISION;  Surgeon: Gearlean Alf, MD;  Location: [facility];  Service: Orthopedics;  Laterality: Right;  . TUBAL LIGATION      Current Medications: Outpatient Medications Prior to Visit  Medication Sig Dispense Refill  .  acetaminophen (TYLENOL) 325 MG tablet Take 650 mg by mouth every 6 (six) hours as needed for moderate pain (Right knee pain).    Marland Kitchen albuterol (PROAIR HFA) 108 (90 Base) MCG/ACT inhaler Use 2 puffs every 4 hours  as needed for wheezing 25.5 g 3  . albuterol (PROVENTIL) (2.5 MG/3ML) 0.083% nebulizer solution TAKE 1 VIAL (3 MLS) BY  NEBULIZATION EVERY 4 HOURS  AS NEEDED FOR WHEEZING 36 mL 1  . aspirin 81 MG tablet Take 81 mg by mouth daily.    . citalopram (CELEXA) 20 MG tablet Take 1 tablet (20 mg total) by mouth every morning. 30  tablet 3  . EPIPEN 2-PAK 0.3 MG/0.3ML SOAJ injection Inject 0.3 mLs (0.3 mg  total) into the muscle as  needed for severe allergic  reaction, [name], LEVOTHROID) 75 MCG tablet Take 1 tablet by mouth  daily before breakfast 90 tablet 4  . losartan (COZAAR) 25 MG tablet TAKE 1 TABLET BY MOUTH  DAILY 90 tablet 0  . metFORMIN (GLUCOPHAGE) 500 MG tablet TAKE 2 TABLETS BY MOUTH 2  TIMES DAILY WITH MEALS. 360 tablet 4  . methocarbamol (ROBAXIN) 500 MG tablet Take 1 tablet (500 mg total) by mouth every 6 (six) hours as needed for muscle spasms. 80 tablet 0  . Omega-3 Fatty Acids (FISH OIL) 1000 MG CAPS Take 1 capsule by mouth every morning. Reported on 02/11/2016    . omeprazole (PRILOSEC) 20 MG capsule Take 1 capsule by mouth  twice daily 180 capsule 1  . ondansetron (ZOFRAN) 4 MG tablet Take 1 tablet (4 mg total) by mouth every 8 (eight) hours as needed for nausea. 20 tablet 0  . potassium chloride SA (K-DUR,KLOR-CON) 20 MEQ tablet Take 1 tablet (20 mEq total) by mouth daily. 90 tablet 1  . propranolol (INDERAL) 40 MG tablet TAKE 1 TABLET BY MOUTH  DAILY 90 tablet 1  . rosuvastatin (CRESTOR) 20 MG tablet Take 1 tablet by mouth  daily 90 tablet 4  . Travoprost, BAK Free, (TRAVATAN) 0.004 % SOLN ophthalmic solution Place 1 drop into both eyes at bedtime. 1 Bottle 5  . traZODone (DESYREL) 50 MG tablet Take 0.5 tablets (25 mg total) by mouth at  bedtime. 30 tablet 3  . triamcinolone cream (KENALOG) 0.5 % APPLY TOPICALLY TO ITCHY AREAS TWICE DAILY AS NEEDED FOR ITCHING UNTIL 2 TO 3 DAYS AFTER HEALED; Order number [id number] 15 g 0  . polyethylene glycol powder (GLYCOLAX/MIRALAX) powder TAKE 17 G BY MOUTH 2 (TWO) TIMES DAILY AS NEEDED FOR LAXATIVE 1054 g 0  . losartan (COZAAR) 25 MG tablet Take 1 tablet (25 mg total) by mouth daily. (Patient not taking: Reported on 10/05/2016) 90 tablet 3   No facility-administered medications prior to visit.      Allergies:   Chlorhexidine; Naproxen; Pregabalin; Propoxyphene n-acetaminophen;  Statins; Amitriptyline; Amitriptyline hcl; Benazepril; Benazepril hcl; Gabapentin; Oxycodone-acetaminophen; Propoxyphene; and Tylox [oxycodone-acetaminophen]   Social History   Social History  . Marital status: Divorced    Spouse name: N/A  . Number of children: N/A  . Years of education: N/A   Social History Main Topics  . Smoking status: Never Smoker  . Smokeless tobacco: Never Used  . Alcohol use No  . Drug use: No  . Sexual activity: No   Other Topics Concern  . Not on file   Social History Narrative  . No narrative on file     Family History:  The patient's family history includes Cancer in her brother; Heart failure in her mother and sister; Hypertension in her mother; Kidney disease in her daughter; Stroke in her father.   ROS:   Please see the history of present illness.    ROS All other systems reviewed and are negative.   PHYSICAL EXAM:   VS:  BP 100/68   Pulse 92   Ht 5' (1.524 m)   Wt 62.6 kg (138 lb)   BMI 26.95 kg/m    GEN: Well nourished, well developed, in no acute distress  HEENT: normal  Neck: no JVD, carotid bruits, or masses Cardiac: Paradoxically split S2, RRR; no murmurs, rubs, or gallops,no edema , healthy left subclavian pacemaker site Respiratory:  clear to auscultation bilaterally, normal work of breathing GI: soft, nontender, nondistended, + BS MS: no deformity  or atrophy  Skin: warm and dry, no rash Neuro:  Alert and Oriented x 3, Strength and sensation are intact Psych: euthymic mood, full affect  Wt Readings from Last 3 Encounters:  10/05/16 62.6 kg (138 lb)  09/30/16 62.9 kg (138 lb 9.6 oz)  07/11/16 62.9 kg (138 lb 9.6 oz)      Studies/Labs Reviewed:   EKG:  EKG is not ordered today.  The intraardiac electrogram today demonstrates A paced, V paced rhythm  Recent Labs: 04/04/2016: ALT 33; BUN 12; Creat 0.84; Potassium 4.6; Sodium 139   Lipid Panel    Component Value Date/Time   CHOL 110 (L) 04/04/2016 1415   TRIG 146 04/04/2016 1415   HDL 42 (L) 04/04/2016 1415   CHOLHDL 2.6 04/04/2016 1415   VLDL 29 04/04/2016 1415   LDLCALC 39 04/04/2016 1415   LDLDIRECT 56 06/08/2012 1120     ASSESSMENT:    1. Complete heart block (HCC)      PLAN:  In order of problems listed above:  1. CHF: Well compensated on a very low dose of loop diuretic.  Appears euvolemic by clinical exam. NYHA functional class I-II. 2. CHB: She is pacemaker dependent and very sensitive to pacemaker interrogation. She feels very poorly even when she does downloads from home. Prefers to have the device checked in the office every 3 months. 3. PPM: Normal device function. Pacemaker dependent. Office checks every 3 months. Increase frequency to monthly in the last 3 months of anticipated generator longevity. When the time comes for generator change out she should receive an antibioticTyrx pouch since this will be the third intervention in that location and she is pacemaker dependent.  4. HLP: She does not have coronary/vascular disease but does have diabetes mellitus. LDL well within target range. 5. DM: Approved as she is working on improving her diet. She remains relatively lean. BMI is recorded as higher at today's visit, since she was entered as having a lower height, but there has been no change in her weight. 6. HTN: Good  blood pressure  control.     Medication Adjustments/Labs and Tests Ordered: Current medicines are reviewed at length with the patient today.  Concerns regarding medicines are outlined above.  Medication changes, Labs and Tests ordered today are listed in the Patient Instructions below. Patient Instructions  Dr Sallyanne Kuster recommends that you schedule a follow-up appointment in 3 months with a pacemaker check.  If you need a refill on your cardiac medications before your next appointment, please call your pharmacy.    Signed, Sanda Klein, MD  10/06/2016 3:21 PM    [facility] [place], [place], [place]  [address] Phone: [phone]; Fax: [phone]

## 2016-10-06 ENCOUNTER — Encounter: Payer: Self-pay | Admitting: Cardiovascular Disease

## 2016-10-18 ENCOUNTER — Other Ambulatory Visit: Payer: Self-pay | Admitting: Family Medicine

## 2016-10-18 MED ORDER — TRAZODONE HCL 50 MG PO TABS: 25.0000 mg | ORAL_TABLET | Freq: Every day | ORAL | 3 refills | Status: DC

## 2016-10-18 NOTE — Telephone Encounter (Signed)
pt calling to request refill of:  Name of Medication(s):  Trazodone  Last date of OV:  09-30-16 Pharmacy: [facility]   Will route refill request to Clinic RN.  Discussed with patient policy to call pharmacy for future refills.  Also, discussed refills may take up to 48 hours to approve or deny.  Mercedes Barr Pt would like to up the dose of the medication. ep

## 2016-10-24 ENCOUNTER — Encounter: Payer: Self-pay | Admitting: Family Medicine

## 2016-10-24 ENCOUNTER — Ambulatory Visit (INDEPENDENT_AMBULATORY_CARE_PROVIDER_SITE_OTHER): Payer: Medicare Other | Admitting: Family Medicine

## 2016-10-24 VITALS — BP 101/58 | HR 89 | Temp 98.0°F | Wt 139.0 lb

## 2016-10-24 DIAGNOSIS — E119 Type 2 diabetes mellitus without complications: Secondary | ICD-10-CM | POA: Diagnosis not present

## 2016-10-24 LAB — POCT GLYCOSYLATED HEMOGLOBIN (HGB A1C): Hemoglobin A1C: 8.5

## 2016-10-24 NOTE — Progress Notes (Signed)
   Subjective:    Patient ID: Mercedes Barr, female    DOB: 23-Nov-1945, 71 y.o.   MRN: [id number]   CC: Diabetes management follow up  HPI: Mercedes Barr is a 71 yo female with a past medical history significant for complete heart block, T2DM, HTN, HLD, Hypothyroidism and COPD who presented today for routine A1c check. Patient has been following DM2 diet and has been exercising more as well. Patient report recently (2/28) during her yearly wellness check she had her A1c measured and it was 8.0. Patient had A1c measured in the office back in December 2017 (12/4) and it was 8.8. Patient also report episodes of nausea and mild dizziness that she states have been going on for about 1 month. The only allievating factor has been eating a piece of candy and then she would feel fine. Patient denies any emesis, diaphoresis, fall, SOB or chest pain. Patient also had ABI done in during welless visit which showed abnormal result in her left leg. Patient denies any lower extremity pain, decrease mobility or change in ambulation or temperature of her legs. Patient was recently seen by her cardiologist on 3/5 with a follow up schedule in 3 month for pacemaker check. Patient also report a boil in her right axilla, that has been painful but she has been doing warm compresses with some improvement in her symptoms and a developing "head" on the boil.  Smoking status reviewed   ROS: all other systems were reviewed and are negative other than in the HPI   Past medical history, surgical, family, and social history reviewed and updated in the EMR as appropriate.  Objective:  BP (!) 101/58   Pulse 89   Temp 98 F (36.7 C) (Oral)   Wt 139 lb (63 kg)   SpO2 97%   BMI 27.15 kg/m   Vitals and nursing note reviewed  General: NAD, pleasant, able to participate in exam Cardiac: RRR, normal heart sounds, no murmurs. 2+ radial and PT pulses bilaterally Respiratory: CTAB, normal effort, No wheezes, rales or  rhonchi Abdomen: soft, nontender, nondistended, no hepatic or splenomegaly, +BS Extremities: no edema or cyanosis. WWP. Right axilla abscess with surrounding erythema and some fluctuance, tender to palpation, without drainage Skin: warm and dry, no rashes noted Neuro: alert and oriented x4, no focal deficits Psych: Normal affect and mood   Assessment & Plan:   #DM type 2 A1c today 8.5 down from 8.8 on 12/4. Patient has described episodes of nausea and dizziness that could be suggestive of hypoglycemia. Patient has been sporadically measuring blood glucose at home and has been within acceptable range. Patient has changed her diet in the past few months and has been eating less.  --Will continue current home regimen of metformin, glipizide and Januvia --Will check blood glucose 2-3 times daily  #Abnormal ABI, acute Patient with normal ABI on right extremity 1.14 and abnormal left foot 0.57. Patient denies any symptoms suggestive of peripheral vascular disease. Given diabetes, abnomral ABI and elevated cholesterol. Will increase rosuvastatin dosage. --Start patient on 40 mg rosuvastatin  #Right axilla abscess, acute  Irritation noted on exam, developing abscess with evidence of pus but no drainage. Patient taking tylenol for the pain. No fluctuance noted on exam. No prior history. --Will continue warm compresses  --Monitor for sign of infection, will broaden treatment as needed consider I&D or antibiotics   Marjie Skiff, MD Family Medicine Resident PGY-1

## 2016-10-24 NOTE — Patient Instructions (Addendum)
It was great seeing you today! We have addressed the following issues today  1. I will increase your crestor to 40 mg given "abnormal" ABI test, and slightly elevated cholesterol with your history of Diabetes.  2. I want you to measure your blood sugar everyday for the next few days and keep a good record of the reads, please bring them at your next visit.  If we did any lab work today, and the results require attention, either me or my nurse will get in touch with you. If everything is normal, you will get a letter in mail and a message via . If you don't hear from [place] in two weeks, please give [place] a call. Otherwise, we look forward to seeing you again at your next visit. If you have any questions or concerns before then, please call the clinic at [phone].  Please bring all your medications to every doctors visit  Sign up for My Chart to have easy access to your labs results, and communication with your Primary care physician.    Please check-out at the front desk before leaving the clinic.    Take Care,   Dr. Andy Gauss

## 2016-11-01 ENCOUNTER — Other Ambulatory Visit: Payer: Self-pay | Admitting: *Deleted

## 2016-11-01 MED ORDER — POLYETHYLENE GLYCOL 3350 17 GM/SCOOP PO POWD: ORAL | 0 refills | Status: DC

## 2016-11-01 MED ORDER — TRAZODONE HCL 50 MG PO TABS: 25.0000 mg | ORAL_TABLET | Freq: Every day | ORAL | 3 refills | Status: DC

## 2016-11-01 MED ORDER — LEVOTHYROXINE SODIUM 75 MCG PO TABS: ORAL_TABLET | ORAL | 4 refills | Status: DC

## 2016-11-02 ENCOUNTER — Other Ambulatory Visit: Payer: Self-pay | Admitting: *Deleted

## 2016-11-02 MED ORDER — OMEPRAZOLE 20 MG PO CPDR: 20.0000 mg | DELAYED_RELEASE_CAPSULE | Freq: Two times a day (BID) | ORAL | 1 refills | Status: DC

## 2016-11-16 ENCOUNTER — Other Ambulatory Visit: Payer: Self-pay | Admitting: Family Medicine

## 2016-11-16 MED ORDER — ROSUVASTATIN CALCIUM 20 MG PO TABS: 20.0000 mg | ORAL_TABLET | Freq: Every day | ORAL | 4 refills | Status: DC

## 2016-11-16 NOTE — Telephone Encounter (Signed)
Pt calling to request refill of:  Name of Medication(s):  crestor Last date of OV:  10-24-16 Pharmacy:  [facility]  Will route refill request to Clinic RN.  Discussed with patient policy to call pharmacy for future refills.  Also, discussed refills may take up to 48 hours to approve or deny.  Mercedes Barr

## 2016-12-19 DIAGNOSIS — H401132 Primary open-angle glaucoma, bilateral, moderate stage: Secondary | ICD-10-CM | POA: Diagnosis not present

## 2017-01-01 NOTE — Progress Notes (Signed)
Patient ID: Mercedes Barr, female   DOB: [birth year], 71 y.o.   MRN: [id number]    Cardiology Office Note    Date:  01/04/2017   ID:  Mercedes Barr, DOB [birth year], MRN [id number]  PCP:  Marjie Skiff, MD  Cardiologist:   Sanda Klein, MD   Chief Complaint  Patient presents with  . Follow-up    pt c/o being tired all the time     History of Present Illness:  Mercedes Barr is a 71 y.o. female with complete heart block, pacemaker dependent, history of endocardial cushion defect repair, heart failure with preserved left ventricular systolic function.  She is feeling well. The patient specifically denies any chest pain at rest exertion, dyspnea at rest or with exertion, orthopnea, paroxysmal nocturnal dyspnea, syncope, palpitations, focal neurological deficits, intermittent claudication, lower extremity edema, unexplained weight gain, cough, hemoptysis or wheezing.  Interrogation of her pacemaker shows that she reached elective replacement indicator on May 21. She has noticed a slight worsening of dyspnea on exertion since that date. She is now VVI pacing at a fixed rate of 65 bpm, whereas before she had a pretty good heart rate distribution based on sensor response. A couple of P waves are seen corresponding to severe underlying sinus bradycardia. She had also 100% atrial pacing in the past, as well as complete heart block. In the past she has had a relatively reliable escape rhythm at about 37 bpm, but we did not test this today.  Usually, when we interrogate her pacemaker she has a brief sensation of near syncope that she finds very disturbing. She therefore does not want to do home downloads since even the brief magnet mode switch makes her feel unwell.  She had surgical repair of atrioventricular cushion defect repair in 1988 when she was 71 years old. Surgery was complicated by development of complete heart block and she received a pacemaker (her initial device probably was an  abdominal generator with epicardial leads). A right subclavian pacemaker was placed in 1991, but had to be removed because of complaints of pain. She had a complete revision of the system in 1999 and her current left subclavian atrial and ventricular leads date to that time. Her most recent generator change out occurred in 2008. The atrial lead is a Medtronic [id number]. The ventricular lead is Medtronic [id number]. The current generator is a Medtronic Adapta.  Past Medical History:  Diagnosis Date  . 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 Asthma   . Complete heart block ([place])   . COPD (chronic obstructive pulmonary disease) ([place])   . Coronary artery disease   . Diabetes mellitus   . GERD (gastroesophageal reflux disease)   . Heart murmur   . Hypertension   . Hypothyroidism   . Normal coronary arteries 2011  . Pacemaker    PACEMAKER DEPENDENT-DR. [place].  OFFICE NOTE DR. A. LITTLE STATES  "EXTREMELY PACEMAKER SENSITIVE AND WHEN YOU TRY TO CHECK FOR UNDERLYING RHYTHMS SHE WILL HAVE SNYCOPE AND WE HAVE NOT DONE THIS NOW IN ABOUT 2 YEARS".  . Shortness of breath dyspnea    walking distance or climbing stairs    Past Surgical History:  Procedure Laterality Date  . ATRIOVENTRICULAR CUSHION DEFECT REPAIR  1988  . CARDIAC CATHETERIZATION  2011    SHOWED NO CAD-PER CARDIOLOGY OFFICE NOTES DR. A. LITTLE  . colonscopy      removed polyps   . INSERT / REPLACE / REMOVE  PACEMAKER    . PACEMAKER INSERTION  1988   last gen 11/08- MDT  . TOTAL KNEE ARTHROPLASTY Right 11/19/2012   Procedure: RIGHT TOTAL KNEE ARTHROPLASTY;  Surgeon: Gearlean Alf, MD;  Location: [facility];  Service: Orthopedics;  Laterality: Right;  . TOTAL KNEE REVISION Right 09/24/2014   Procedure: RIGHT TOTAL KNEE ARTHROPLASTY REVISION;  Surgeon: Gearlean Alf, MD;  Location: [facility];  Service: Orthopedics;  Laterality: Right;  . TUBAL LIGATION      Current Medications: Outpatient Medications Prior to Visit    Medication Sig Dispense Refill  . acetaminophen (TYLENOL) 325 MG tablet Take 650 mg by mouth every 6 (six) hours as needed for moderate pain (Right knee pain).    Marland Kitchen albuterol (PROAIR HFA) 108 (90 Base) MCG/ACT inhaler Use 2 puffs every 4 hours  as needed for wheezing 25.5 g 3  . albuterol (PROVENTIL) (2.5 MG/3ML) 0.083% nebulizer solution TAKE 1 VIAL (3 MLS) BY  NEBULIZATION EVERY 4 HOURS  AS NEEDED FOR WHEEZING 36 mL 1  . aspirin 81 MG tablet Take 81 mg by mouth daily.    . citalopram (CELEXA) 20 MG tablet Take 1 tablet (20 mg total) by mouth every morning. 30 tablet 3  . EPIPEN 2-PAK 0.3 MG/0.3ML SOAJ injection Inject 0.3 mLs (0.3 mg  total) into the muscle as  needed for severe allergic  reaction, [name], LEVOTHROID) 75 MCG tablet Take 1 tablet by mouth  daily before breakfast 90 tablet 4  . losartan (COZAAR) 25 MG tablet TAKE 1 TABLET BY MOUTH  DAILY 90 tablet 0  . metFORMIN (GLUCOPHAGE) 500 MG tablet TAKE 2 TABLETS BY MOUTH 2  TIMES DAILY WITH MEALS. 360 tablet 4  . methocarbamol (ROBAXIN) 500 MG tablet Take 1 tablet (500 mg total) by mouth every 6 (six) hours as needed for muscle spasms. 80 tablet 0  . Omega-3 Fatty Acids (FISH OIL) 1000 MG CAPS Take 1 capsule by mouth every morning. Reported on 02/11/2016    . omeprazole (PRILOSEC) 20 MG capsule Take 1 capsule (20 mg total) by mouth 2 (two) times daily. 180 capsule 1  . ondansetron  (ZOFRAN) 4 MG tablet Take 1 tablet (4 mg total) by mouth every 8 (eight) hours as needed for nausea. 20 tablet 0  . polyethylene glycol powder (GLYCOLAX/MIRALAX) powder TAKE 17 G BY MOUTH 2 (TWO) TIMES DAILY AS NEEDED FOR LAXATIVE 1054 g 0  . potassium chloride SA (K-DUR,KLOR-CON) 20 MEQ tablet Take 1 tablet (20 mEq total) by mouth daily. 90 tablet 1  . propranolol (INDERAL) 40 MG tablet TAKE 1 TABLET BY MOUTH  DAILY 90 tablet 1  . rosuvastatin (CRESTOR) 20 MG tablet Take 1 tablet (20 mg total) by mouth daily. 90 tablet 4  . Travoprost, BAK Free, (TRAVATAN) 0.004 % SOLN ophthalmic solution Place 1  drop into both eyes at bedtime. 1 Bottle 5  . traZODone (DESYREL) 50 MG tablet Take 0.5 tablets (25 mg total) by mouth at bedtime. 30 tablet 3  . triamcinolone cream (KENALOG) 0.5 % APPLY TOPICALLY TO ITCHY AREAS TWICE DAILY AS NEEDED FOR ITCHING UNTIL 2 TO 3 DAYS AFTER HEALED; Order number [id number] 15 g 0   No facility-administered medications prior to visit.      Allergies:   Chlorhexidine; Naproxen; Pregabalin; Propoxyphene n-acetaminophen; Statins; Amitriptyline; Amitriptyline hcl; Benazepril; Benazepril hcl; Gabapentin; Oxycodone-acetaminophen; Propoxyphene; and Tylox [oxycodone-acetaminophen]   Social History   Social History  . Marital status: Divorced    Spouse name: N/A  . Number of children: N/A  . Years of education: N/A   Social History Main Topics  . Smoking status: Never Smoker  . Smokeless tobacco: Never Used  . Alcohol use No  . Drug use: No  . Sexual activity: No   Other Topics Concern  . Not on file   Social History Narrative  . No narrative on file     Family History:  The patient's family history includes Cancer in her brother; Heart failure in her mother and sister; Hypertension in her mother; Kidney disease in her daughter; Stroke in her father.   ROS:   Please see the history of present illness.    ROS All other systems reviewed and are negative.   PHYSICAL  EXAM:   VS:  BP 122/60 (BP Location: Right Arm, Patient Position: Sitting, Cuff Size: Normal)   Pulse 65   Ht 5\' 2"  (1.575 m)   Wt 138 lb (62.6 kg)   BMI 25.24 kg/m     General: Alert, oriented x3, no distress Head: no evidence of trauma, PERRL, EOMI, no exophtalmos or lid lag, no myxedema, no xanthelasma; normal ears, nose and oropharynx Neck: normal jugular venous pulsations and no hepatojugular reflux; brisk carotid pulses without delay and no carotid bruits Chest: clear to auscultation, no signs of consolidation by percussion or palpation, normal fremitus, symmetrical and full respiratory excursions, healthy left subclavian pacemaker site Cardiovascular: normal position and quality of the apical impulse, regular rhythm, normal first and second heart sounds, no murmurs, rubs or gallops Abdomen: no tenderness or distention, no masses by palpation, no abnormal pulsatility or arterial bruits, normal bowel sounds, no hepatosplenomegaly Extremities: no clubbing, cyanosis or edema; 2+ radial, ulnar and brachial pulses bilaterally; 2+ right femoral, posterior tibial and dorsalis pedis pulses; 2+ left femoral, posterior tibial and dorsalis pedis pulses; no subclavian or femoral bruits Neurological: grossly nonfocal Psych: euthymic mood, full affect  Wt Readings from Last 3 Encounters:  01/04/17 138 lb (62.6 kg)  10/24/16 139 lb (63 kg)  10/05/16 138 lb (62.6 kg)      Studies/Labs Reviewed:   EKG:  EKG is ordered today.  There is 100% ventricular paced rhythm, asynchronous. A couple of random P waves are seen corresponding to a very slow underlying sinus rhythm  Recent Labs: 04/04/2016: ALT 33; BUN 12; Creat 0.84; Potassium 4.6; Sodium 139   Lipid Panel    Component Value Date/Time   CHOL 110 (L) 04/04/2016 1415   TRIG 146 04/04/2016 1415   HDL 42 (L) 04/04/2016 1415   CHOLHDL 2.6 04/04/2016 1415   VLDL 29 04/04/2016 1415   LDLCALC 39 04/04/2016 1415   LDLDIRECT 56 06/08/2012 1120       ASSESSMENT:    1. (HFpEF) heart failure with preserved ejection fraction ([place])   2. Complete heart block ([place])  3. Pacemaker   4. Pure hypercholesterolemia   5. Type 2 diabetes mellitus without complication, without long-term current use of insulin ([place])   6. Essential hypertension   7. Pacemaker battery depletion      PLAN:  In order of problems listed above:  1. CHF: Well compensated on a very low dose of loop diuretic.  Appears euvolemic by clinical exam. NYHA functional class I-II. 2. CHB: She is pacemaker dependent and very sensitive to pacemaker interrogation. She feels very poorly even when she does downloads from home.  3. PPM: Device reached ERI on May 21. Normal device function at the most recent check. Pacemaker dependent. Discussed generator change. Plan to implant a Tyrx pouch since this will be the third intervention in that location and she is pacemaker dependent. I recommended a pacemaker generator change out next week, but she wants to delay this until after July 4. That is not unreasonable as long as she is not particularly symptomatic. Should have reliable device backup function until August 21 or so. This procedure has been fully reviewed with the patient and informed consent has been obtained. 4. HLP: She does not have coronary/vascular disease but does have diabetes mellitus. LDL well within target range. 5. DM: Reports borderline glycemic control. Most recent hemoglobin A1c was still too high at 8.5% in March 2015, but had improved from 10.2% last August. 6. HTN: Good blood pressure control.     Medication Adjustments/Labs and Tests Ordered: Current medicines are reviewed at length with the patient today.  Concerns regarding medicines are outlined above.  Medication changes, Labs and Tests ordered today are listed in the Patient Instructions below. Patient Instructions  Pacemaker Battery Change A pacemaker battery usually lasts 5-15 years (6-7 years on  average). A few times a year, you will be asked to visit your health care provider to have a full evaluation of your pacemaker. When the battery is low, your pacemaker battery and generator will be completely replaced. Most often, this procedure is simpler than the first surgery because the wires (leads) that connect the generator to the heart are already in place. There are many things that affect how long a pacemaker battery will last, including:  The age of the pacemaker.  The number of leads you have(1, 2, or 3).  The pacemaker workload. If the pacemaker is helping the heart more often, the battery will not last as long.  Power (voltage) settings. Tell a health care provider about:  Any allergies you have.  All medicines you are taking, including vitamins, herbs, eye drops, creams, and over-the-counter medicines.  Any problems you or family members have had with anesthetic medicines.  Any blood disorders you have.  Any surgeries you have had, especially the surgeries you have had since your last pacemaker was placed.  Any medical conditions you have.  Whether you are pregnant or may be pregnant.  Any symptoms of heart problems, such as chest pain, trouble breathing, palpitations, light-headedness, or feelings of an abnormal or irregular heartbeat.  Smoking habits. This can affect your reaction to anesthesia. What are the risks? Generally, this is a safe procedure. However, problems may occur, including:  Bleeding.  Bruising of the skin around where the surgical cut (incision) was made.  Pulling apart of the skin at the incision site.  Infection.  Nerve damage.  Injury to other organs, such as the lungs.  Allergic reaction to anesthetics or other medicines used during the procedure.  People with diabetes may have a  temporary increase in blood sugar (glucose) after any surgical procedure. What happens before the procedure? Staying hydrated  Follow instructions from  your health care provider about hydration, which may include:  Up to 2 hours before the procedure - you may continue to drink clear liquids, such as water, clear fruit juice, black coffee, and plain tea. Eating and drinking restrictions  Follow instructions from your health care provider about eating and drinking restrictions, which may include:  8 hours before the procedure - stop eating heavy meals or foods such as meat, fried foods, or fatty foods.  6 hours before the procedure - stop eating light meals or foods, such as toast or cereal.  6 hours before the procedure - stop drinking milk or drinks that contain milk.  2 hours before the procedure - stop drinking clear liquids. General instructions   Ask your health care provider about:  Changing or stopping your regular medicines. This is especially important if you are taking diabetes medicines or blood thinners.  Taking medicines such as aspirin and ibuprofen. These medicines can thin your blood. Do not take these medicines before your procedure if your health care provider instructs you not to.  Taking a sip of water with any approved medicines on the morning of the procedure.  Plan to have someone take you home after the procedure. What happens during the procedure?  To reduce your risk of infection:  Your health care team will wash or sanitize their hands.  The skin around the area of the chest will be washed with soap.  Hair may be removed from the surgical area.  An IV tube will be inserted into one your veins to give you medicine and fluids.  You will be given one or more of the following:  A medicine to help you relax (sedative).  A medicine to numb the area where the pacemaker is located (local anesthetic).  You may be given antibiotic medicine to prevent infection.  Your health care provider will make an incision to reopen the pocket holding the pacemaker.  The old pacemaker will be disconnected from the  leads.  The leads will be tested.  If needed, the leads will be replaced. If the leads are functioning properly, the new pacemaker will be connected to the existing leads.  A heart monitor and the pacemaker programmer will be used to make sure that the newly implanted pacemaker is working properly.  The incision site will be closed. A bandage (dressing) will be placed over the pacemaker site. The procedure may vary among health care providers and hospitals. What happens after the procedure?  Your blood pressure, heart rate, breathing rate, and blood oxygen level will be monitored until your health care team is satisfied that your pacemaker is working properly.  Your health care provider will tell you when your pacemaker will need to be tested again, or when to return to the office for removal of dressing and stitches.  Do not drive for 24 hours if you were given a sedative.  The dressing will be removed 24-48 hours after the procedure, or as told by your health care provider. Summary  A pacemaker battery usually lasts 5-15 years (6-7 years on average).  When the battery is low, your pacemaker battery and generator will be completely replaced.  Risks of this procedure include bleeding, bruising, infection, damage to other structures, pulling apart of the skin at the incision site, and allergic reactions to medicines or anesthetics.  Most often, this procedure  is simpler than the first surgery because the wires (leads) that connect the generator to the heart are already in place. This information is not intended to replace advice given to you by your health care provider. Make sure you discuss any questions you have with your health care provider. Document Released: 11/02/2006 Document Revised: 06/28/2016 Document Reviewed: 06/28/2016 Elsevier Interactive Patient Education  2017 [place], Sanda Klein, MD  01/04/2017 2:24 PM    [facility]  [facility], [place], [place]  [address] Phone: [phone]; Fax: [phone]

## 2017-01-04 ENCOUNTER — Ambulatory Visit (INDEPENDENT_AMBULATORY_CARE_PROVIDER_SITE_OTHER): Payer: Medicare Other | Admitting: Cardiovascular Disease

## 2017-01-04 ENCOUNTER — Encounter: Payer: Self-pay | Admitting: Cardiovascular Disease

## 2017-01-04 VITALS — BP 122/60 | HR 65 | Ht 62.0 in | Wt 138.0 lb

## 2017-01-04 DIAGNOSIS — I10 Essential (primary) hypertension: Secondary | ICD-10-CM

## 2017-01-04 DIAGNOSIS — I5030 Unspecified diastolic (congestive) heart failure: Secondary | ICD-10-CM

## 2017-01-04 DIAGNOSIS — E119 Type 2 diabetes mellitus without complications: Secondary | ICD-10-CM | POA: Diagnosis not present

## 2017-01-04 DIAGNOSIS — Z950 Presence of cardiac pacemaker: Secondary | ICD-10-CM | POA: Diagnosis not present

## 2017-01-04 DIAGNOSIS — I442 Atrioventricular block, complete: Secondary | ICD-10-CM

## 2017-01-04 DIAGNOSIS — E7800 Pure hypercholesterolemia, unspecified: Secondary | ICD-10-CM

## 2017-01-04 DIAGNOSIS — Z45010 Encounter for checking and testing of cardiac pacemaker pulse generator [battery]: Secondary | ICD-10-CM | POA: Diagnosis not present

## 2017-01-04 LAB — CUP PACEART INCLINIC DEVICE CHECK
Date Time Interrogation Session: 20180530133915
Implantable Lead Location: 753859
Implantable Lead Model: 5068
Lead Channel Impedance Value: 620 Ohm
Lead Channel Setting Pacing Amplitude: 2.5 V
MDC IDC LEAD IMPLANT DT: 19990203
MDC IDC LEAD IMPLANT DT: 19990203
MDC IDC LEAD LOCATION: 753860
MDC IDC MSMT BATTERY IMPEDANCE: 6342 Ohm
MDC IDC MSMT BATTERY VOLTAGE: 2.64 V
MDC IDC MSMT LEADCHNL RA IMPEDANCE VALUE: 67 Ohm
MDC IDC PG IMPLANT DT: 20081114
MDC IDC SET LEADCHNL RV PACING PULSEWIDTH: 0.4 ms
MDC IDC SET LEADCHNL RV SENSING SENSITIVITY: 2.8 mV
MDC IDC STAT BRADY RV PERCENT PACED: 100 %

## 2017-01-04 NOTE — Patient Instructions (Signed)
Pacemaker Battery Change °A pacemaker battery usually lasts 5-15 years (6-7 years on average). A few times a year, you will be asked to visit your health care provider to have a full evaluation of your pacemaker. When the battery is low, your pacemaker battery and generator will be completely replaced. Most often, this procedure is simpler than the first surgery because the wires (leads) that connect the generator to the heart are already in place. °There are many things that affect how long a pacemaker battery will last, including: °· The age of the pacemaker. °· The number of leads you have(1, 2, or 3). °· The pacemaker workload. If the pacemaker is helping the heart more often, the battery will not last as long. °· Power (voltage) settings. ° °Tell a health care provider about: °· Any allergies you have. °· All medicines you are taking, including vitamins, herbs, eye drops, creams, and over-the-counter medicines. °· Any problems you or family members have had with anesthetic medicines. °· Any blood disorders you have. °· Any surgeries you have had, especially the surgeries you have had since your last pacemaker was placed. °· Any medical conditions you have. °· Whether you are pregnant or may be pregnant. °· Any symptoms of heart problems, such as chest pain, trouble breathing, palpitations, light-headedness, or feelings of an abnormal or irregular heartbeat. °· Smoking habits. This can affect your reaction to anesthesia. °What are the risks? °Generally, this is a safe procedure. However, problems may occur, including: °· Bleeding. °· Bruising of the skin around where the surgical cut (incision) was made. °· Pulling apart of the skin at the incision site. °· Infection. °· Nerve damage. °· Injury to other organs, such as the lungs. °· Allergic reaction to anesthetics or other medicines used during the procedure. °· People with diabetes may have a temporary increase in blood sugar (glucose) after any surgical  procedure. ° °What happens before the procedure? °Staying hydrated °Follow instructions from your health care provider about hydration, which may include: °· Up to 2 hours before the procedure - you may continue to drink clear liquids, such as water, clear fruit juice, black coffee, and plain tea. ° °Eating and drinking restrictions °Follow instructions from your health care provider about eating and drinking restrictions, which may include: °· 8 hours before the procedure - stop eating heavy meals or foods such as meat, fried foods, or fatty foods. °· 6 hours before the procedure - stop eating light meals or foods, such as toast or cereal. °· 6 hours before the procedure - stop drinking milk or drinks that contain milk. °· 2 hours before the procedure - stop drinking clear liquids. ° °General instructions °· Ask your health care provider about: °? Changing or stopping your regular medicines. This is especially important if you are taking diabetes medicines or blood thinners. °? Taking medicines such as aspirin and ibuprofen. These medicines can thin your blood. Do not take these medicines before your procedure if your health care provider instructs you not to. °? Taking a sip of water with any approved medicines on the morning of the procedure. °· Plan to have someone take you home after the procedure. °What happens during the procedure? °· To reduce your risk of infection: °? Your health care team will wash or sanitize their hands. °? The skin around the area of the chest will be washed with soap. °? Hair may be removed from the surgical area. °· An IV tube will be inserted into one your   veins to give you medicine and fluids. °· You will be given one or more of the following: °? A medicine to help you relax (sedative). °? A medicine to numb the area where the pacemaker is located (local anesthetic). °· You may be given antibiotic medicine to prevent infection. °· Your health care provider will make an incision to  reopen the pocket holding the pacemaker. °· The old pacemaker will be disconnected from the leads. °· The leads will be tested. °· If needed, the leads will be replaced. If the leads are functioning properly, the new pacemaker will be connected to the existing leads. °· A heart monitor and the pacemaker programmer will be used to make sure that the newly implanted pacemaker is working properly. °· The incision site will be closed. A bandage (dressing) will be placed over the pacemaker site. °The procedure may vary among health care providers and hospitals. °What happens after the procedure? °· Your blood pressure, heart rate, breathing rate, and blood oxygen level will be monitored until your health care team is satisfied that your pacemaker is working properly. °· Your health care provider will tell you when your pacemaker will need to be tested again, or when to return to the office for removal of dressing and stitches. °· Do not drive for 24 hours if you were given a sedative. °· The dressing will be removed 24-48 hours after the procedure, or as told by your health care provider. °Summary °· A pacemaker battery usually lasts 5-15 years (6-7 years on average). °· When the battery is low, your pacemaker battery and generator will be completely replaced. °· Risks of this procedure include bleeding, bruising, infection, damage to other structures, pulling apart of the skin at the incision site, and allergic reactions to medicines or anesthetics. °· Most often, this procedure is simpler than the first surgery because the wires (leads) that connect the generator to the heart are already in place. °This information is not intended to replace advice given to you by your health care provider. Make sure you discuss any questions you have with your health care provider. °Document Released: 11/02/2006 Document Revised: 06/28/2016 Document Reviewed: 06/28/2016 °Elsevier Interactive Patient Education © 2017 [employer]vier Inc. ° °

## 2017-01-05 ENCOUNTER — Other Ambulatory Visit: Payer: Self-pay | Admitting: Family Medicine

## 2017-01-05 DIAGNOSIS — I10 Essential (primary) hypertension: Secondary | ICD-10-CM

## 2017-01-06 ENCOUNTER — Other Ambulatory Visit: Payer: Self-pay | Admitting: Family Medicine

## 2017-02-10 ENCOUNTER — Other Ambulatory Visit: Payer: Self-pay

## 2017-02-10 DIAGNOSIS — Z45010 Encounter for checking and testing of cardiac pacemaker pulse generator [battery]: Secondary | ICD-10-CM

## 2017-02-10 DIAGNOSIS — Z950 Presence of cardiac pacemaker: Secondary | ICD-10-CM | POA: Diagnosis not present

## 2017-02-10 DIAGNOSIS — I442 Atrioventricular block, complete: Secondary | ICD-10-CM | POA: Diagnosis not present

## 2017-02-11 LAB — CBC
HEMATOCRIT: 39.4 % (ref 34.0–46.6)
Hemoglobin: 12.8 g/dL (ref 11.1–15.9)
MCH: 27 pg (ref 26.6–33.0)
MCHC: 32.5 g/dL (ref 31.5–35.7)
MCV: 83 fL (ref 79–97)
Platelets: 203 10*3/uL (ref 150–379)
RBC: 4.74 x10E6/uL (ref 3.77–5.28)
RDW: 13.7 % (ref 12.3–15.4)
WBC: 5.6 10*3/uL (ref 3.4–10.8)

## 2017-02-11 LAB — BASIC METABOLIC PANEL
BUN/Creatinine Ratio: 18 (ref 12–28)
BUN: 16 mg/dL (ref 8–27)
CO2: 22 mmol/L (ref 20–29)
Calcium: 9.4 mg/dL (ref 8.7–10.3)
Chloride: 100 mmol/L (ref 96–106)
Creatinine, Ser: 0.9 mg/dL (ref 0.57–1.00)
GFR, EST AFRICAN AMERICAN: 75 mL/min/{1.73_m2} (ref >59)
GFR, EST NON AFRICAN AMERICAN: 65 mL/min/{1.73_m2} (ref >59)
Glucose: 260 mg/dL — ABNORMAL HIGH (ref 65–99)
POTASSIUM: 4.2 mmol/L (ref 3.5–5.2)
SODIUM: 141 mmol/L (ref 134–144)

## 2017-02-11 LAB — PROTIME-INR
INR: 1 (ref 0.8–1.2)
Prothrombin Time: 11.1 s (ref 9.1–12.0)

## 2017-02-15 ENCOUNTER — Ambulatory Visit (HOSPITAL_COMMUNITY)
Admission: RE | Admit: 2017-02-15 | Discharge: 2017-02-15 | Disposition: A | Discharge status: Home / Self Care | Payer: Medicare Other | Source: Ambulatory Visit | Attending: Cardiovascular Disease | Admitting: Cardiovascular Disease

## 2017-02-15 ENCOUNTER — Encounter (HOSPITAL_COMMUNITY)
Admission: RE | Disposition: A | Discharge status: Home / Self Care | Payer: Self-pay | Source: Ambulatory Visit | Attending: Cardiovascular Disease

## 2017-02-15 DIAGNOSIS — Z7951 Long term (current) use of inhaled steroids: Secondary | ICD-10-CM | POA: Diagnosis not present

## 2017-02-15 DIAGNOSIS — I110 Hypertensive heart disease with heart failure: Secondary | ICD-10-CM | POA: Diagnosis not present

## 2017-02-15 DIAGNOSIS — Z8249 Family history of ischemic heart disease and other diseases of the circulatory system: Secondary | ICD-10-CM | POA: Diagnosis not present

## 2017-02-15 DIAGNOSIS — J449 Chronic obstructive pulmonary disease, unspecified: Secondary | ICD-10-CM | POA: Diagnosis not present

## 2017-02-15 DIAGNOSIS — E785 Hyperlipidemia, unspecified: Secondary | ICD-10-CM | POA: Diagnosis not present

## 2017-02-15 DIAGNOSIS — I442 Atrioventricular block, complete: Secondary | ICD-10-CM | POA: Insufficient documentation

## 2017-02-15 DIAGNOSIS — Z7982 Long term (current) use of aspirin: Secondary | ICD-10-CM | POA: Diagnosis not present

## 2017-02-15 DIAGNOSIS — M1990 Unspecified osteoarthritis, unspecified site: Secondary | ICD-10-CM | POA: Insufficient documentation

## 2017-02-15 DIAGNOSIS — Z7984 Long term (current) use of oral hypoglycemic drugs: Secondary | ICD-10-CM | POA: Diagnosis not present

## 2017-02-15 DIAGNOSIS — K219 Gastro-esophageal reflux disease without esophagitis: Secondary | ICD-10-CM | POA: Insufficient documentation

## 2017-02-15 DIAGNOSIS — E039 Hypothyroidism, unspecified: Secondary | ICD-10-CM | POA: Diagnosis not present

## 2017-02-15 DIAGNOSIS — I509 Heart failure, unspecified: Secondary | ICD-10-CM | POA: Insufficient documentation

## 2017-02-15 DIAGNOSIS — Z45010 Encounter for checking and testing of cardiac pacemaker pulse generator [battery]: Secondary | ICD-10-CM

## 2017-02-15 DIAGNOSIS — E119 Type 2 diabetes mellitus without complications: Secondary | ICD-10-CM | POA: Insufficient documentation

## 2017-02-15 DIAGNOSIS — I2510 Atherosclerotic heart disease of native coronary artery without angina pectoris: Secondary | ICD-10-CM | POA: Diagnosis not present

## 2017-02-15 HISTORY — PX: PPM GENERATOR CHANGEOUT: EP1233

## 2017-02-15 LAB — GLUCOSE, CAPILLARY: Glucose-Capillary: 211 mg/dL — ABNORMAL HIGH (ref 65–99)

## 2017-02-15 LAB — SURGICAL PCR SCREEN
MRSA, PCR: NEGATIVE
Staphylococcus aureus: NEGATIVE

## 2017-02-15 SURGERY — PPM GENERATOR CHANGEOUT

## 2017-02-15 MED ORDER — FENTANYL CITRATE (PF) 100 MCG/2ML IJ SOLN
INTRAMUSCULAR | Status: AC
  Filled 2017-02-15: qty 2

## 2017-02-15 MED ORDER — SODIUM CHLORIDE 0.9% FLUSH: 3.0000 mL | Freq: Two times a day (BID) | INTRAVENOUS | Status: DC

## 2017-02-15 MED ORDER — ONDANSETRON HCL 4 MG/2ML IJ SOLN: 4.0000 mg | Freq: Four times a day (QID) | INTRAMUSCULAR | Status: DC | PRN

## 2017-02-15 MED ORDER — SODIUM CHLORIDE 0.9 % IV SOLN
250.0000 mL | INTRAVENOUS | Status: DC | PRN
Start: 2017-02-15 — End: 2017-02-15

## 2017-02-15 MED ORDER — LIDOCAINE HCL (PF) 1 % IJ SOLN
INTRAMUSCULAR | Status: DC | PRN
  Administered 2017-02-15: 40 mL

## 2017-02-15 MED ORDER — FENTANYL CITRATE (PF) 100 MCG/2ML IJ SOLN
INTRAMUSCULAR | Status: DC | PRN
  Administered 2017-02-15: 25 ug via INTRAVENOUS

## 2017-02-15 MED ORDER — LIDOCAINE HCL (PF) 1 % IJ SOLN
INTRAMUSCULAR | Status: AC
  Filled 2017-02-15: qty 60

## 2017-02-15 MED ORDER — CHLORHEXIDINE GLUCONATE 4 % EX LIQD: 60.0000 mL | Freq: Once | CUTANEOUS | Status: DC

## 2017-02-15 MED ORDER — MIDAZOLAM HCL 5 MG/5ML IJ SOLN
INTRAMUSCULAR | Status: AC
  Filled 2017-02-15: qty 5

## 2017-02-15 MED ORDER — CEFAZOLIN SODIUM-DEXTROSE 2-4 GM/100ML-% IV SOLN
INTRAVENOUS | Status: AC
  Filled 2017-02-15: qty 100

## 2017-02-15 MED ORDER — MIDAZOLAM HCL 5 MG/5ML IJ SOLN
INTRAMUSCULAR | Status: DC | PRN
  Administered 2017-02-15: 2 mg via INTRAVENOUS

## 2017-02-15 MED ORDER — SODIUM CHLORIDE 0.9 % IV SOLN: INTRAVENOUS | Status: DC

## 2017-02-15 MED ORDER — MUPIROCIN 2 % EX OINT
TOPICAL_OINTMENT | CUTANEOUS | Status: AC
  Filled 2017-02-15: qty 22

## 2017-02-15 MED ORDER — CEFAZOLIN SODIUM-DEXTROSE 2-4 GM/100ML-% IV SOLN
2.0000 g | INTRAVENOUS | Status: AC
  Administered 2017-02-15: 2 g via INTRAVENOUS
  Filled 2017-02-15: qty 100

## 2017-02-15 MED ORDER — SODIUM CHLORIDE 0.9% FLUSH
3.0000 mL | INTRAVENOUS | Status: DC | PRN
Start: 2017-02-15 — End: 2017-02-15

## 2017-02-15 MED ORDER — ACETAMINOPHEN 325 MG PO TABS: 325.0000 mg | ORAL_TABLET | ORAL | Status: DC | PRN

## 2017-02-15 MED ORDER — SODIUM CHLORIDE 0.9 % IR SOLN
Status: AC
  Filled 2017-02-15: qty 2

## 2017-02-15 MED ORDER — MUPIROCIN 2 % EX OINT
TOPICAL_OINTMENT | Freq: Once | CUTANEOUS | Status: DC
Start: 2017-02-15 — End: 2017-02-15

## 2017-02-15 MED ORDER — SODIUM CHLORIDE 0.9 % IR SOLN
80.0000 mg | Status: AC
  Administered 2017-02-15: 80 mg
  Filled 2017-02-15: qty 2

## 2017-02-15 SURGICAL SUPPLY — 5 items
CABLE SURGICAL S-101-97-12 (CABLE) ×3 IMPLANT
PACEMAKER ADAPTA DR ADDRL1 (Pacemaker) ×1 IMPLANT
PAD DEFIB LIFELINK (PAD) ×3 IMPLANT
PPM ADAPTA DR ADDRL1 (Pacemaker) ×3 IMPLANT
TRAY PACEMAKER INSERTION (PACKS) ×3 IMPLANT

## 2017-02-15 NOTE — Op Note (Signed)
Procedure report  Procedure performed:  1. Dual chamber pacemaker generator changeout  2. Light sedation  Reason for procedure:  1. Device generator at elective replacement interval  2. Complete heart block Procedure performed by:  Sanda Klein, MD  Complications:  None  Estimated blood loss:  <5 mL  Medications administered during procedure:  Ancef 2 g intravenously,  lidocaine 1% 30 mL locally, fentanyl 25 mcg intravenously, Versed 2 mg intravenously Device details:   New Generator Medtronic Adapta L model number ADDRL1, serial number [id number] Right atrial lead (chronic) Medtronic , model number [id number], serial [id number] (implanted 09/10/1997) Right ventricular lead (chronic)  Medtronic, model number [id number], serial number [id number] (implanted 09/10/1997)  Explanted generator Medtronic Adapta,  model number [id number], (implanted 06/22/2007)  Procedure details:  After the risks and benefits of the procedure were discussed the patient provided informed consent. She was brought to the cardiac catheter lab in the fasting state. The patient was prepped and draped in usual sterile fashion. Local anesthesia with 1% lidocaine was administered to to the left infraclavicular area. A 5-6cm horizontal incision was made parallel with and 2-3 cm caudal to the left clavicle, in the area of an old scar. An older scar was seen closer to the left clavicle. Using minimal electrocautery and mostly sharp and blunt dissection the prepectoral pocket was opened carefully to avoid injury to the loops of chronic leads. Extensive dissection was necessary due to keloid formation. The device was explanted. The pocket was carefully inspected for hemostasis and flushed with copious amounts of antibiotic solution.  The leads were disconnected from the old generator and testing of the lead parameters later showed excellent values. The new generator was connected to the chronic leads, with appropriate pacing noted.    The entire system was then carefully inserted in the pocket with care been taking that the leads and device assumed a comfortable position without pressure on the incision. Great care was taken that the leads be located deep to the generator. The pocket was then closed in layers using 2 layers of 2-0 Vicryl and cutaneous staples after which a sterile dressing was applied.   At the end of the procedure the following lead parameters were encountered:   Right atrial lead sensed P waves 1.4 mV, impedance 629 ohms, threshold 0.5 at 0.4 ms pulse width.  Right ventricular lead sensed R waves  None detected, impedance 615 ohms, threshold 0.75 at 0.4 ms pulse width.  Sanda Klein, MD, [facility] [phone] office [phone] pager

## 2017-02-15 NOTE — H&P (Signed)
Chief Complaint:  Pacemaker battery change  HPI:  Mercedes Barr is a 71 y.o. female with complete heart block, pacemaker dependent, history of endocardial cushion defect repair, heart failure with preserved left ventricular systolic function.  Interrogation of her pacemaker shows that she reached elective replacement indicator on May 21. She has noticed a slight worsening of dyspnea on exertion since that date. She is now VVI pacing at a fixed rate of 65 bpm, whereas before she had a pretty good heart rate distribution based on sensor response. A couple of P waves are seen corresponding to severe underlying sinus bradycardia. She had also 100% atrial pacing in the past, as well as complete heart block. In the past she has had a relatively reliable escape rhythm at about 37 bpm.  Usually, when we interrogate her pacemaker she has a brief sensation of near syncope that she finds very disturbing. She therefore does not want to do home downloads since even the brief magnet mode switch makes her feel unwell.  She had surgical repair of atrioventricular cushion defect repair in 1988 when she was 71 years old. Surgery was complicated by development of complete heart block and she received a pacemaker (her initial device probably was an abdominal generator with epicardial leads). A right subclavian pacemaker was placed in 1991, but had to be removed because of complaints of pain. She had a complete revision of the system in 1999 and her current left subclavian atrial and ventricular leads date to that time. Her most recent generator change out occurred in 2008. The atrial lead is a Medtronic [id number]. The ventricular lead is Medtronic [id number]. The current generator is a Medtronic Adapta.  PMHx:  Past Medical History:  Diagnosis Date  . 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 Asthma   . Complete heart block ([place])   . COPD (chronic obstructive pulmonary disease) ([place])   . Coronary artery disease   . Diabetes  mellitus   . GERD (gastroesophageal reflux disease)   . Heart murmur   . Hypertension   . Hypothyroidism   . Normal coronary arteries 2011  . Pacemaker    PACEMAKER DEPENDENT-DR. McNabb.  OFFICE NOTE DR. A. LITTLE STATES  "EXTREMELY PACEMAKER SENSITIVE AND WHEN YOU TRY TO CHECK FOR UNDERLYING RHYTHMS SHE WILL HAVE SNYCOPE AND WE HAVE NOT DONE THIS NOW IN ABOUT 2 YEARS".  . Shortness of breath dyspnea    walking distance or climbing stairs    Past Surgical History:  Procedure Laterality Date  . ATRIOVENTRICULAR CUSHION DEFECT REPAIR  1988  . CARDIAC CATHETERIZATION  2011    SHOWED NO CAD-PER CARDIOLOGY OFFICE NOTES DR. A. LITTLE  . colonscopy      removed polyps   . INSERT / REPLACE / REMOVE PACEMAKER    . PACEMAKER INSERTION  1988   last gen 11/08- MDT  . TOTAL KNEE ARTHROPLASTY Right 11/19/2012   Procedure: RIGHT TOTAL KNEE ARTHROPLASTY;  Surgeon: Gearlean Alf, MD;  Location: [facility];  Service: Orthopedics;  Laterality: Right;  . TOTAL KNEE REVISION Right 09/24/2014   Procedure: RIGHT TOTAL KNEE ARTHROPLASTY REVISION;  Surgeon: Gearlean Alf, MD;  Location: [facility];  Service: Orthopedics;  Laterality: Right;  . TUBAL LIGATION      FAMHx:  Family History  Problem Relation Age of Onset  . Heart failure Mother   . Hypertension Mother   . Stroke Father   . Cancer Brother   . Kidney  disease Daughter   . Heart failure Sister     SOCHx:   reports that she has never smoked. She has never used smokeless tobacco. She reports that she does not drink alcohol or use drugs.  ALLERGIES:  Allergies  Allergen Reactions  . Chlorhexidine Itching and Rash  . Naproxen Itching and Swelling  . Pregabalin Nausea And Vomiting and Itching  . Propoxyphene N-Acetaminophen Nausea Only  . Statins Other (See Comments)    REACTION: leg pains PT IS ABLE TO TOLERATE CRESTOR  . Amitriptyline Itching  . Amitriptyline Hcl Itching and Rash  . Benazepril  Itching  . Benazepril Hcl Itching and Rash  . Gabapentin Itching  . Oxycodone-Acetaminophen Itching  . Propoxyphene Itching  . Tylox [Oxycodone-Acetaminophen] Itching and Rash    ROS: Pertinent items noted in HPI and remainder of comprehensive ROS otherwise negative.  HOME MEDS: Medications Prior to Admission  Medication Sig Dispense Refill  . acetaminophen (TYLENOL) 325 MG tablet Take 650 mg by mouth every 6 (six) hours as needed for moderate pain (Right knee pain).    Marland Kitchen albuterol (PROAIR HFA) 108 (90 Base) MCG/ACT inhaler Use 2 puffs every 4 hours  as needed for wheezing 25.5 g 3  . albuterol (PROVENTIL) (2.5 MG/3ML) 0.083% nebulizer solution TAKE 1 VIAL (3 MLS) BY  NEBULIZATION EVERY 4 HOURS  AS NEEDED FOR WHEEZING 36 mL 1  . aspirin 81 MG tablet Take 81 mg by mouth daily.    . citalopram (CELEXA) 20 MG tablet TAKE 1 TABLET BY MOUTH EVERY MORNING 30 tablet 1  . EPIPEN 2-PAK 0.3 MG/0.3ML SOAJ injection Inject 0.3 mLs (0.3 mg  total) into the muscle as  needed for severe allergic  reaction, and contact  medical provider. 2 Device 0  . fluticasone (FLOVENT HFA) 110 MCG/ACT inhaler Inhale 1 puff into the lungs 2 (two) times daily. 1 Inhaler 5  . furosemide (LASIX) 20 MG tablet TAKE 1 TABLET BY MOUTH  EVERY MORNING 90 tablet 1  . GLIPIZIDE XL 10 MG 24 hr tablet Take 1 tablet by mouth  daily with breakfast 90 tablet 4  . JANUVIA 100 MG tablet Take 1 tablet by mouth  every morning 90 tablet 4  . levothyroxine (SYNTHROID, LEVOTHROID) 75 MCG tablet Take 1 tablet by mouth  daily before breakfast 90 tablet 4  . losartan (COZAAR) 25 MG tablet TAKE 1 TABLET BY MOUTH  DAILY 90 tablet 1  . metFORMIN (GLUCOPHAGE) 500 MG tablet TAKE 2 TABLETS BY MOUTH 2  TIMES DAILY WITH MEALS. 360 tablet 4  . Omega-3 Fatty Acids (FISH OIL) 1000 MG CAPS Take 1 capsule by mouth every morning. Reported on 02/11/2016    . omeprazole (PRILOSEC) 20 MG capsule Take 1 capsule (20 mg total) by mouth 2 (two) times daily. 180  capsule 1  . polyethylene glycol powder (GLYCOLAX/MIRALAX) powder TAKE 17 G BY MOUTH 2 (TWO) TIMES DAILY AS NEEDED FOR LAXATIVE 1054 g 0  . potassium chloride SA (K-DUR,KLOR-CON) 20 MEQ tablet Take 1 tablet (20 mEq total) by mouth daily. 90 tablet 1  . propranolol (INDERAL) 40 MG tablet TAKE 1 TABLET BY MOUTH  DAILY 90 tablet 1  . rosuvastatin (CRESTOR) 20 MG tablet Take 1 tablet (20 mg total) by mouth daily. (Patient taking differently: Take 20 mg by mouth at bedtime. ) 90 tablet 4  . Travoprost, BAK Free, (TRAVATAN) 0.004 % SOLN ophthalmic solution Place 1 drop into both eyes at bedtime. 1 Bottle 5  . traZODone (DESYREL) 50  MG tablet Take 0.5 tablets (25 mg total) by mouth at bedtime. (Patient taking differently: Take 50 mg by mouth at bedtime as needed for sleep. ) 30 tablet 3  . glucose blood test strip Use for blood sugar testing 1-3 times daily. 100 each 11  . Lancets MISC Use with blood sugar monitor. 100 each 11  . methocarbamol (ROBAXIN) 500 MG tablet Take 1 tablet (500 mg total) by mouth every 6 (six) hours as needed for muscle spasms. (Patient not taking: Reported on 02/10/2017) 80 tablet 0  . ondansetron (ZOFRAN) 4 MG tablet Take 1 tablet (4 mg total) by mouth every 8 (eight) hours as needed for nausea. (Patient not taking: Reported on 02/10/2017) 20 tablet 0  . triamcinolone cream (KENALOG) 0.5 % APPLY TOPICALLY TO ITCHY AREAS TWICE DAILY AS NEEDED FOR ITCHING UNTIL 2 TO 3 DAYS AFTER HEALED; Order number [id number] (Patient not taking: Reported on 02/10/2017) 15 g 0    LABS/IMAGING: No results found for this or any previous visit (from the past 19 hour(s)). No results found.  VITALS: Blood pressure (!) 147/72, pulse 65, temperature 98.2 F (36.8 C), temperature source Oral, resp. rate 18, height 5\' 2"  (1.575 m), weight 136 lb (61.7 kg), SpO2 100 %.  EXAM:  General: Alert, oriented x3, no distress Head: no evidence of trauma, PERRL, EOMI, no exophtalmos or lid lag, no myxedema, no  xanthelasma; normal ears, nose and oropharynx Neck: normal jugular venous pulsations and no hepatojugular reflux; brisk carotid pulses without delay and no carotid bruits Chest: clear to auscultation, no signs of consolidation by percussion or palpation, normal fremitus, symmetrical and full respiratory excursions Cardiovascular: normal position and quality of the apical impulse, regular rhythm, normal first heart sound and paradoxically split second heart sounds, no rubs or gallops, no murmur. Healthy left subclavian pacemaker site. Abdomen: no tenderness or distention, no masses by palpation, no abnormal pulsatility or arterial bruits, normal bowel sounds, no hepatosplenomegaly Extremities: no clubbing, cyanosis or edema; 2+ radial, ulnar and brachial pulses bilaterally; 2+ right femoral, posterior tibial and dorsalis pedis pulses; 2+ left femoral, posterior tibial and dorsalis pedis pulses; no subclavian or femoral bruits Neurological: grossly nonfocal   IMPRESSION: 1. CHF: Well compensated on a very low dose of loop diuretic. Excellent blood pressure. Appears euvolemic by clinical exam. NYHA functional class I-II. 2. CHB: Device has reached ERI since 12/26/2016 plan generator change out today.This procedure has been fully reviewed with the patient and written informed consent has been obtained. She is pacemaker dependent and very sensitive to pacemaker interrogation. She feels very poorly even when she does downloads from home. Prefers to have the device checked in the office every 3 months. 3. HLP: She does not have coronary/vascular disease but does have diabetes mellitus. LDL well within target range. 4. DM: Approved as she is working on improving her diet. She remains relatively lean, with BMI now under 25. 5. HTN: Good blood pressure control.  PLAN: Pacemaker generator change out.This procedure has been fully reviewed with the patient and written informed consent has been  obtained.   Sanda Klein, MD, [facility] [phone] office [phone] pager  02/15/2017, 11:12 AM

## 2017-02-15 NOTE — Discharge Instructions (Signed)
Supplemental Discharge Instructions for  Pacemaker/Defibrillator Patients  Activity No restrictions after today. No driving today. DO wear your seatbelt, even if it crosses over the pacemaker site.  WOUND CARE - Keep the wound area clean and dry.  Remove the dressing the day after you return home (usually 48 hours after the procedure). - DO NOT SUBMERGE UNDER WATER UNTIL FULLY HEALED (no tub baths, hot tubs, swimming pools, etc.).  - You  may shower or take a sponge bath after the dressing is removed. DO NOT SOAK the area and do not allow the shower to directly spray on the site. - If you have staples, these will be removed in the office in 7-14 days. - If you have tape/steri-strips on your wound, these will fall off; do not pull them off prematurely.   - No bandage is needed on the site.  DO  NOT apply any creams, oils, or ointments to the wound area. - If you notice any drainage or discharge from the wound, any swelling, excessive redness or bruising at the site, or if you develop a fever > 101? F after you are discharged home, call the office at once.  Special Instructions - You are still able to use cellular telephones.  Avoid carrying your cellular phone near your device. - When traveling through airports, show security personnel your identification card to avoid being screened in the metal detectors.  - Avoid arc welding equipment, MRI testing (magnetic resonance imaging), TENS units (transcutaneous nerve stimulators).  Call the office for questions about other devices. - Avoid electrical appliances that are in poor condition or are not properly grounded. - Microwave ovens are safe to be near or to operate.

## 2017-02-16 ENCOUNTER — Encounter (HOSPITAL_COMMUNITY): Payer: Self-pay | Admitting: Cardiovascular Disease

## 2017-02-27 ENCOUNTER — Other Ambulatory Visit: Payer: Self-pay | Admitting: Family Medicine

## 2017-02-27 DIAGNOSIS — J42 Unspecified chronic bronchitis: Secondary | ICD-10-CM

## 2017-02-27 MED ORDER — ALBUTEROL SULFATE HFA 108 (90 BASE) MCG/ACT IN AERS: INHALATION_SPRAY | RESPIRATORY_TRACT | 3 refills | Status: DC

## 2017-02-27 MED ORDER — TRAZODONE HCL 50 MG PO TABS: 50.0000 mg | ORAL_TABLET | Freq: Every evening | ORAL | 1 refills | Status: DC | PRN

## 2017-02-27 MED ORDER — OMEPRAZOLE 20 MG PO CPDR: 20.0000 mg | DELAYED_RELEASE_CAPSULE | Freq: Two times a day (BID) | ORAL | 1 refills | Status: DC

## 2017-02-27 MED ORDER — POLYETHYLENE GLYCOL 3350 17 GM/SCOOP PO POWD: ORAL | 0 refills | Status: DC

## 2017-02-27 MED ORDER — ALBUTEROL SULFATE (2.5 MG/3ML) 0.083% IN NEBU: INHALATION_SOLUTION | RESPIRATORY_TRACT | 1 refills | Status: DC

## 2017-02-27 MED ORDER — PROPRANOLOL HCL 40 MG PO TABS: 40.0000 mg | ORAL_TABLET | Freq: Every day | ORAL | 1 refills | Status: DC

## 2017-02-27 NOTE — Telephone Encounter (Signed)
Pt is calling to request refill of:  Name of Medication(s): Albuterol, Lasix, Prilosec, Propranolol, trazodone,  And Polyethylene Last date of OV:  10/24/16 Pharmacy: [facility] on [address]    Will route refill request to Clinic RN.  Discussed with patient policy to call pharmacy for future refills.  Also, discussed refills may take up to 48 hours to approve or deny.  Mercedes Barr

## 2017-02-28 ENCOUNTER — Telehealth: Payer: Self-pay | Admitting: Cardiovascular Disease

## 2017-02-28 NOTE — Telephone Encounter (Signed)
Patient calling, states that she was given new "pace maker machine" at hospital and would like to know if she needs to bring it to appt tomorrow with Dr. Loletha Grayer.

## 2017-02-28 NOTE — Telephone Encounter (Signed)
The patient was informed that she did not need to bring her new pacemaker machine but she stated that she would feel better if she brought it. She was informed that this was fine.

## 2017-03-01 ENCOUNTER — Ambulatory Visit: Payer: Medicare Other

## 2017-03-03 ENCOUNTER — Ambulatory Visit (INDEPENDENT_AMBULATORY_CARE_PROVIDER_SITE_OTHER): Payer: Medicare Other | Admitting: *Deleted

## 2017-03-03 ENCOUNTER — Encounter (INDEPENDENT_AMBULATORY_CARE_PROVIDER_SITE_OTHER): Payer: Self-pay

## 2017-03-03 DIAGNOSIS — I442 Atrioventricular block, complete: Secondary | ICD-10-CM

## 2017-03-03 DIAGNOSIS — Z950 Presence of cardiac pacemaker: Secondary | ICD-10-CM

## 2017-03-03 LAB — CUP PACEART INCLINIC DEVICE CHECK
Battery Remaining Longevity: 158 mo
Battery Voltage: 2.78 V
Brady Statistic AP VS Percent: 0 %
Brady Statistic AS VS Percent: 0 %
Date Time Interrogation Session: 20180727164451
Implantable Lead Implant Date: 19990203
Lead Channel Impedance Value: 665 Ohm
Lead Channel Pacing Threshold Pulse Width: 0.4 ms
Lead Channel Pacing Threshold Pulse Width: 0.4 ms
Lead Channel Setting Pacing Amplitude: 2 V
Lead Channel Setting Pacing Amplitude: 2.5 V
Lead Channel Setting Sensing Sensitivity: 2.8 mV
MDC IDC LEAD IMPLANT DT: 19990203
MDC IDC LEAD LOCATION: 753859
MDC IDC LEAD LOCATION: 753860
MDC IDC MSMT BATTERY IMPEDANCE: 100 Ohm
MDC IDC MSMT LEADCHNL RA IMPEDANCE VALUE: 651 Ohm
MDC IDC MSMT LEADCHNL RA PACING THRESHOLD AMPLITUDE: 1 V
MDC IDC MSMT LEADCHNL RA SENSING INTR AMPL: 1.4 mV
MDC IDC MSMT LEADCHNL RV PACING THRESHOLD AMPLITUDE: 1 V
MDC IDC PG IMPLANT DT: 20180711
MDC IDC SET LEADCHNL RV PACING PULSEWIDTH: 0.4 ms
MDC IDC STAT BRADY AP VP PERCENT: 40 %
MDC IDC STAT BRADY AS VP PERCENT: 60 %

## 2017-03-03 NOTE — Progress Notes (Signed)
Wound check appointment. Staples removed. Wound without redness or edema. Incision edges approximated, wound well healed. Normal device function. Thresholds, sensing, and impedances consistent with implant measurements (RV threshold terminated at 1.0V @ 0.65ms as patient is historically very symptomatic with LOC, threshold <1.25V @ 0.65ms). Device programmed at chronic outputs; RA min output increased to 2.0V, RV min output increased to 2.5V per protocol. Histogram distribution appropriate for patient and level of activity. No mode switches or high ventricular rates noted. Patient educated about wound care, arm mobility, lifting restrictions, and Carelink monitor. ROV with Helen on 06/07/17.

## 2017-03-05 IMAGING — DX DG CHEST 2V
2 series · 2 of 2 positions shown · non-contrast
Comparison: 09/18/2014

CLINICAL DATA: Chest pain

EXAM:
CHEST  2 VIEW

[chest pa]
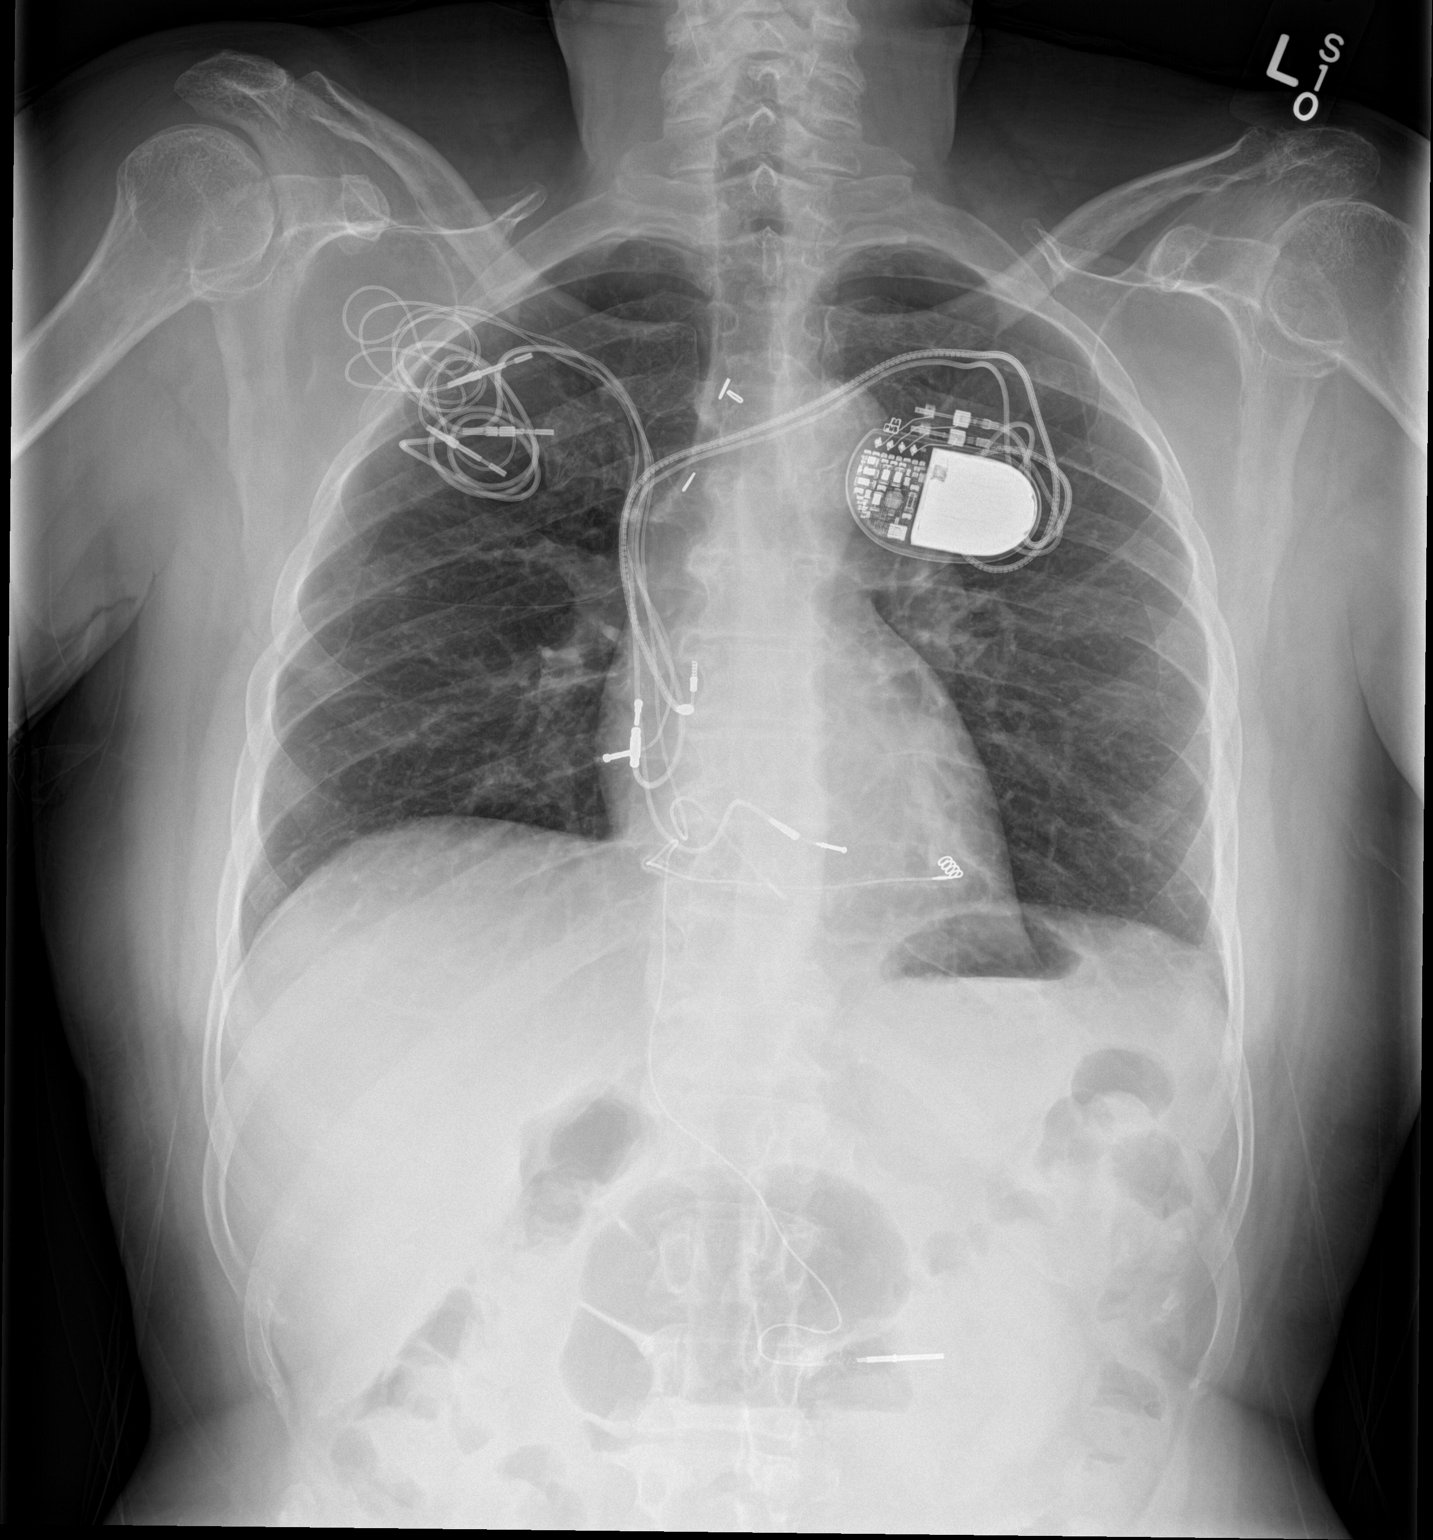

[chest lat]
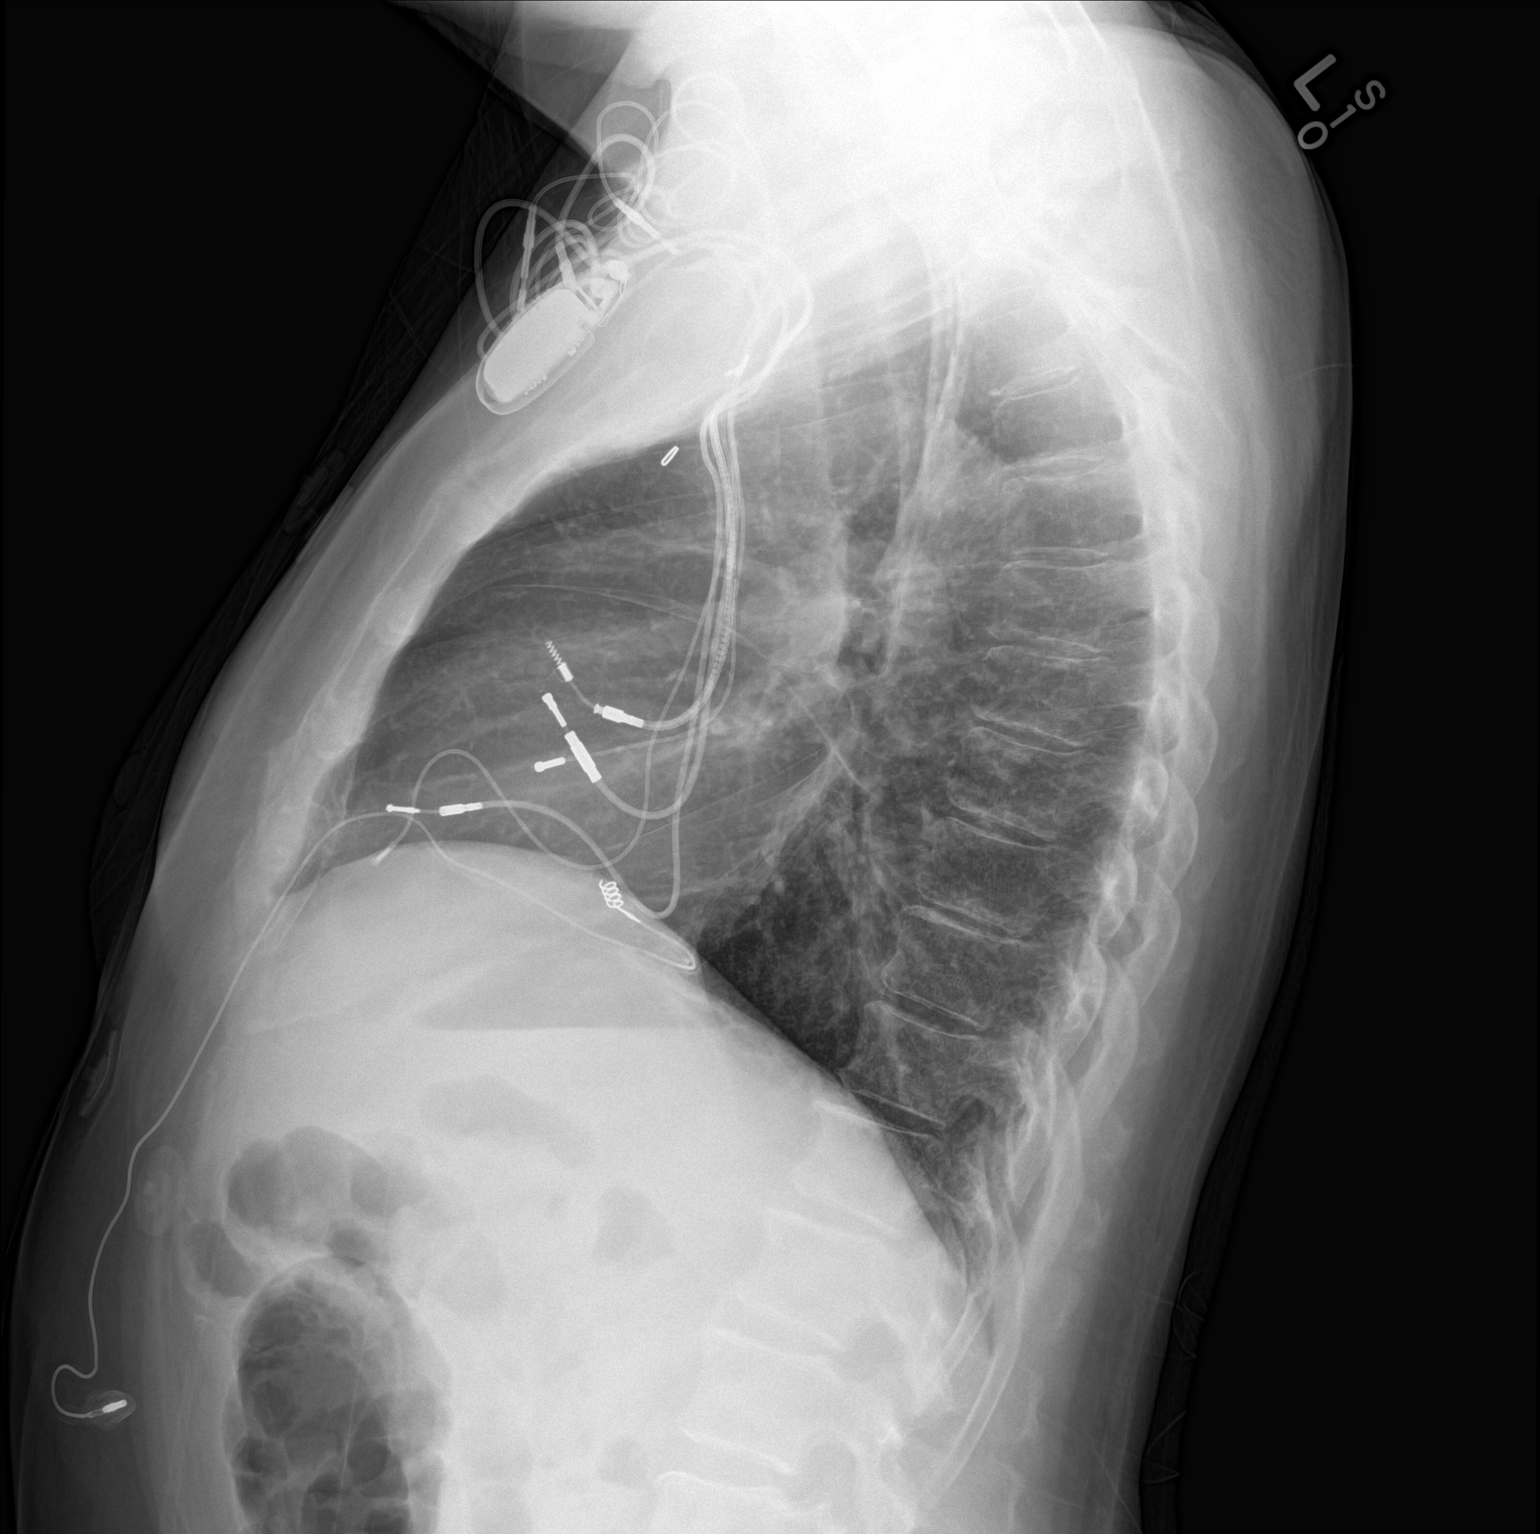

[2 of 2 positions shown; findings below may reference images not displayed]

FINDINGS: Left chest wall pacer device is noted with leads in the right atrial
appendage and right ventricle. Abandoned right chest wall pacer
leads are identified. Normal heart size. No pleural effusion or
edema.
IMPRESSION: 1. No acute cardiopulmonary abnormalities.

## 2017-03-07 ENCOUNTER — Other Ambulatory Visit: Payer: Self-pay | Admitting: Family Medicine

## 2017-03-07 DIAGNOSIS — J42 Unspecified chronic bronchitis: Secondary | ICD-10-CM

## 2017-03-07 DIAGNOSIS — I5030 Unspecified diastolic (congestive) heart failure: Secondary | ICD-10-CM

## 2017-03-07 DIAGNOSIS — I10 Essential (primary) hypertension: Secondary | ICD-10-CM

## 2017-03-07 MED ORDER — ALBUTEROL SULFATE (2.5 MG/3ML) 0.083% IN NEBU: INHALATION_SOLUTION | RESPIRATORY_TRACT | 1 refills | Status: DC

## 2017-03-07 MED ORDER — FUROSEMIDE 20 MG PO TABS: 20.0000 mg | ORAL_TABLET | Freq: Every morning | ORAL | 1 refills | Status: DC

## 2017-03-07 MED ORDER — OMEPRAZOLE 20 MG PO CPDR
20.0000 mg | DELAYED_RELEASE_CAPSULE | Freq: Two times a day (BID) | ORAL | 1 refills | Status: DC
Start: 2017-03-07 — End: 2017-09-07

## 2017-03-07 MED ORDER — ALBUTEROL SULFATE HFA 108 (90 BASE) MCG/ACT IN AERS: INHALATION_SPRAY | RESPIRATORY_TRACT | 3 refills | Status: DC

## 2017-03-07 MED ORDER — LEVOTHYROXINE SODIUM 75 MCG PO TABS: ORAL_TABLET | ORAL | 4 refills | Status: DC

## 2017-03-07 MED ORDER — TRAZODONE HCL 50 MG PO TABS: 50.0000 mg | ORAL_TABLET | Freq: Every evening | ORAL | 1 refills | Status: DC | PRN

## 2017-03-07 MED ORDER — ROSUVASTATIN CALCIUM 20 MG PO TABS: 20.0000 mg | ORAL_TABLET | Freq: Every day | ORAL | 4 refills | Status: DC

## 2017-03-07 MED ORDER — PROPRANOLOL HCL 40 MG PO TABS: 40.0000 mg | ORAL_TABLET | Freq: Every day | ORAL | 1 refills | Status: DC

## 2017-03-07 NOTE — Telephone Encounter (Signed)
[facility] has cancelled her prescription service because they didn't receive a response from the dr.   The following medicines need to be re-started:  Albuterol. Furosemide, synthroid, omeprazole, trazodone, Crestor,propanolol.  Fax number for [facility] is [phone].

## 2017-03-15 ENCOUNTER — Ambulatory Visit: Payer: Medicare Other

## 2017-03-21 ENCOUNTER — Other Ambulatory Visit: Payer: Self-pay | Admitting: *Deleted

## 2017-03-21 MED ORDER — CITALOPRAM HYDROBROMIDE 20 MG PO TABS: 20.0000 mg | ORAL_TABLET | Freq: Every morning | ORAL | 1 refills | Status: DC

## 2017-03-22 ENCOUNTER — Other Ambulatory Visit: Payer: Self-pay | Admitting: *Deleted

## 2017-03-22 MED ORDER — SITAGLIPTIN PHOSPHATE 100 MG PO TABS: 100.0000 mg | ORAL_TABLET | Freq: Every morning | ORAL | 4 refills | Status: DC

## 2017-03-22 MED ORDER — METFORMIN HCL 500 MG PO TABS: ORAL_TABLET | ORAL | 4 refills | Status: DC

## 2017-03-27 ENCOUNTER — Other Ambulatory Visit: Payer: Self-pay | Admitting: Family Medicine

## 2017-04-25 ENCOUNTER — Other Ambulatory Visit: Payer: Self-pay | Admitting: *Deleted

## 2017-04-26 MED ORDER — POTASSIUM CHLORIDE CRYS ER 20 MEQ PO TBCR: 20.0000 meq | EXTENDED_RELEASE_TABLET | Freq: Every day | ORAL | 1 refills | Status: DC

## 2017-05-30 ENCOUNTER — Other Ambulatory Visit: Payer: Self-pay | Admitting: Family Medicine

## 2017-05-30 DIAGNOSIS — I10 Essential (primary) hypertension: Secondary | ICD-10-CM

## 2017-05-30 DIAGNOSIS — I5030 Unspecified diastolic (congestive) heart failure: Secondary | ICD-10-CM

## 2017-06-07 ENCOUNTER — Encounter: Payer: Medicare Other | Admitting: Cardiovascular Disease

## 2017-08-15 ENCOUNTER — Other Ambulatory Visit: Payer: Self-pay | Admitting: Family Medicine

## 2017-08-16 ENCOUNTER — Ambulatory Visit (INDEPENDENT_AMBULATORY_CARE_PROVIDER_SITE_OTHER): Payer: Medicare Other | Admitting: Cardiovascular Disease

## 2017-08-16 ENCOUNTER — Encounter: Payer: Self-pay | Admitting: Cardiovascular Disease

## 2017-08-16 VITALS — BP 123/76 | HR 84 | Ht 62.0 in | Wt 137.0 lb

## 2017-08-16 DIAGNOSIS — I4819 Other persistent atrial fibrillation: Secondary | ICD-10-CM

## 2017-08-16 DIAGNOSIS — I5030 Unspecified diastolic (congestive) heart failure: Secondary | ICD-10-CM

## 2017-08-16 DIAGNOSIS — E119 Type 2 diabetes mellitus without complications: Secondary | ICD-10-CM | POA: Diagnosis not present

## 2017-08-16 DIAGNOSIS — E7800 Pure hypercholesterolemia, unspecified: Secondary | ICD-10-CM | POA: Diagnosis not present

## 2017-08-16 DIAGNOSIS — I481 Persistent atrial fibrillation: Secondary | ICD-10-CM

## 2017-08-16 DIAGNOSIS — I442 Atrioventricular block, complete: Secondary | ICD-10-CM

## 2017-08-16 DIAGNOSIS — Z950 Presence of cardiac pacemaker: Secondary | ICD-10-CM | POA: Diagnosis not present

## 2017-08-16 DIAGNOSIS — I10 Essential (primary) hypertension: Secondary | ICD-10-CM

## 2017-08-16 MED ORDER — APIXABAN 5 MG PO TABS: 5.0000 mg | ORAL_TABLET | Freq: Two times a day (BID) | ORAL | 1 refills | Status: DC

## 2017-08-16 MED ORDER — APIXABAN 5 MG PO TABS: 5.0000 mg | ORAL_TABLET | Freq: Two times a day (BID) | ORAL | 0 refills | Status: DC

## 2017-08-16 NOTE — Progress Notes (Signed)
Patient ID: Mercedes Barr, female   DOB: 07/17/1946, 72 y.o.   MRN: [id number]    Cardiology Office Note    Date:  08/16/2017   ID:  Mercedes Barr, DOB 04-06-1946, MRN [id number]  PCP:  Marjie Skiff, MD  Cardiologist:   Sanda Klein, MD   Chief Complaint  Patient presents with  . Follow-up    History of Present Illness:  Mercedes Barr is a 72 y.o. female with complete heart block, pacemaker dependent, history of endocardial cushion defect repair, heart failure with preserved left ventricular systolic function.  The patient specifically denies any chest pain at rest exertion, dyspnea at rest or with exertion, orthopnea, paroxysmal nocturnal dyspnea, syncope, palpitations, focal neurological deficits, intermittent claudication, lower extremity edema, unexplained weight gain, cough, hemoptysis or wheezing.  Her current pacemaker was a generator change out performed last year.  Device function is normal.  Estimated generator longevity is 11.5 years.  Due to complete heart block she has 100% ventricular pacing.  She has been in uninterrupted atrial fibrillation since September 2018.  Lead parameters are all normal, except for of course atrial pacing threshold cannot be tested. In the past she has had a relatively reliable escape rhythm at about 37 bpm, but we did not test this today.  She does not like to do remote pacemaker downloads because of the uneasy sensation she has whenever a magnet is placed over her device.  She had surgical repair of atrioventricular cushion defect repair in 1988 when she was 72 years old. Surgery was complicated by development of complete heart block and she received a pacemaker (her initial device probably was an abdominal generator with epicardial leads). A right subclavian pacemaker was placed in 1991, but had to be removed because of complaints of pain. She had a complete revision of the system in 1999 and her current left subclavian atrial and ventricular  leads date to that time. Her most recent generator change out occurred in 2008. The atrial lead is a Medtronic [id number]. The ventricular lead is Medtronic [id number].  She underwent a pacemaker generator change out in 2018, with a Tyrx pouch.  Past Medical History:  Diagnosis Date  . 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 Asthma   . Complete heart block ([place])   . COPD (chronic obstructive pulmonary disease) ([place])   . Coronary artery disease   . Diabetes mellitus   . GERD (gastroesophageal reflux disease)   . Heart murmur   . Hypertension   . Hypothyroidism   . Normal coronary arteries 2011  . Pacemaker    PACEMAKER DEPENDENT-DR. [place].  OFFICE NOTE DR. A. LITTLE STATES  "EXTREMELY PACEMAKER SENSITIVE AND WHEN YOU TRY TO CHECK FOR UNDERLYING RHYTHMS SHE WILL HAVE SNYCOPE AND WE HAVE NOT DONE THIS NOW IN ABOUT 2 YEARS".  . Shortness of breath dyspnea    walking distance or climbing stairs    Past Surgical History:  Procedure Laterality Date  . ATRIOVENTRICULAR CUSHION DEFECT REPAIR  1988  . CARDIAC CATHETERIZATION  2011    SHOWED NO CAD-PER CARDIOLOGY OFFICE NOTES DR. A. LITTLE  . colonscopy      removed polyps   . INSERT / REPLACE / REMOVE PACEMAKER    . PACEMAKER INSERTION  1988   last gen 11/08- MDT  . PPM GENERATOR CHANGEOUT N/A 02/15/2017   Procedure: PPM Generator Changeout;  Surgeon: Sanda Klein, MD;  Location: [facility];  Service: Cardiovascular;  Laterality: N/A;  . TOTAL KNEE ARTHROPLASTY Right 11/19/2012   Procedure: RIGHT TOTAL KNEE ARTHROPLASTY;  Surgeon: Gearlean Alf, MD;  Location: [facility];  Service: Orthopedics;  Laterality: Right;  . TOTAL KNEE REVISION Right 09/24/2014   Procedure: RIGHT TOTAL KNEE ARTHROPLASTY REVISION;  Surgeon: Gearlean Alf, MD;  Location: [facility];  Service: Orthopedics;  Laterality: Right;  . TUBAL LIGATION      Current Medications: Outpatient Medications Prior to Visit  Medication Sig Dispense Refill    . acetaminophen (TYLENOL) 325 MG tablet Take 650 mg by mouth every 6 (six) hours as needed for moderate pain (Right knee pain).    Marland Kitchen albuterol (PROAIR HFA) 108 (90 Base) MCG/ACT inhaler Use 2 puffs every 4 hours  as needed for wheezing 25.5 g 3  . albuterol (PROVENTIL) (2.5 MG/3ML) 0.083% nebulizer solution USE 1 VIAL VIA NEBULIZER  EVERY 4 HOURS AS NEEDED FOR WHEEZING 150 mL 0  . aspirin 81 MG tablet Take 81 mg by mouth daily.    . citalopram (CELEXA) 20 MG tablet TAKE 1 TABLET BY MOUTH  EVERY MORNING 30 tablet 0  . EPIPEN 2-PAK 0.3 MG/0.3ML SOAJ injection Inject 0.3 mLs (0.3 mg  total) into the muscle as  needed for severe allergic  reaction, and contact  medical provider. 2 Device 0  . fluticasone (FLOVENT HFA) 110 MCG/ACT inhaler Inhale 1 puff into the lungs 2 (two) times daily. 1 Inhaler 5  . furosemide (LASIX) 20 MG tablet TAKE 1 TABLET BY MOUTH  EVERY MORNING 90 tablet 0  . GLIPIZIDE XL 10 MG 24 hr tablet Take 1 tablet by mouth  daily with breakfast 90 tablet 4  . glucose blood test strip Use for blood sugar testing 1-3 times daily. 100 each 11  . Lancets MISC Use with blood sugar monitor. 100 each 11  . levothyroxine (SYNTHROID, LEVOTHROID) 75 MCG tablet Take 1 tablet by mouth  daily before breakfast 90 tablet 4  . losartan (COZAAR) 25 MG tablet TAKE 1 TABLET BY MOUTH  DAILY 90 tablet 0  . metFORMIN (GLUCOPHAGE) 500 MG tablet TAKE 2 TABLETS BY MOUTH 2  TIMES DAILY WITH MEALS. 360 tablet 4  . methocarbamol (ROBAXIN) 500 MG tablet Take 1 tablet (500 mg total) by mouth every 6 (six) hours as needed for muscle spasms. 80 tablet 0  . Omega-3 Fatty Acids (FISH OIL) 1000 MG CAPS Take 1 capsule by mouth every morning. Reported on 02/11/2016    . omeprazole (PRILOSEC) 20 MG capsule Take 1 capsule (20 mg total) by mouth 2 (two) times daily. 180 capsule 1  . omeprazole (PRILOSEC) 20 MG capsule TAKE 1 CAPSULE BY MOUTH TWO TIMES DAILY 180 capsule 0  . ondansetron (ZOFRAN) 4 MG tablet Take 1 tablet (4 mg  total) by mouth every 8 (eight) hours as needed for nausea. 20 tablet 0  . polyethylene glycol powder (GLYCOLAX/MIRALAX) powder TAKE 17 GRAMS BY MOUTH 2  TIMES DAILY AS NEEDED FOR  LAXATIVE 1054 g 0  . potassium chloride SA (K-DUR,KLOR-CON) 20 MEQ tablet Take 1 tablet (20 mEq total) by mouth daily. 90 tablet 1  . propranolol (INDERAL) 40 MG tablet Take 1 tablet (40 mg total) by mouth daily. 90 tablet 1  . propranolol (INDERAL) 40 MG tablet TAKE 1 TABLET BY MOUTH  DAILY 90 tablet 0  . rosuvastatin (CRESTOR) 20 MG tablet Take 1 tablet (20 mg total) by mouth daily. 90 tablet 4  . sitaGLIPtin (JANUVIA) 100 MG tablet Take 1 tablet (  100 mg total) by mouth every morning. 90 tablet 4  . Travoprost, BAK Free, (TRAVATAN) 0.004 % SOLN ophthalmic solution Place 1 drop into both eyes at bedtime. 1 Bottle 5  . traZODone (DESYREL) 50 MG tablet Take 1 tablet (50 mg total) by mouth at bedtime as needed for sleep. 60 tablet 1  . traZODone (DESYREL) 50 MG tablet TAKE 1 TABLET BY MOUTH AT  BEDTIME AS NEEDED FOR SLEEP 60 tablet 0  . triamcinolone cream (KENALOG) 0.5 % APPLY TOPICALLY TO ITCHY AREAS TWICE DAILY AS NEEDED FOR ITCHING UNTIL 2 TO 3 DAYS AFTER HEALED; Order number [id number] 15 g 0   No facility-administered medications prior to visit.      Allergies:   Chlorhexidine; Naproxen; Pregabalin; Propoxyphene n-acetaminophen; Statins; Amitriptyline; Amitriptyline hcl; Benazepril; Benazepril hcl; Gabapentin; Oxycodone-acetaminophen; Propoxyphene; and Tylox [oxycodone-acetaminophen]   Social History   Socioeconomic History  . Marital status: Divorced    Spouse name: None  . Number of children: None  . Years of education: None  . Highest education level: None  Social Needs  . Financial resource strain: None  . Food insecurity - worry: None  . Food insecurity - inability: None  . Transportation needs - medical: None  . Transportation needs - non-medical: None  Occupational History  . None  Tobacco Use    . Smoking status: Never Smoker  . Smokeless tobacco: Never Used  Substance and Sexual Activity  . Alcohol use: No  . Drug use: No  . Sexual activity: No  Other Topics Concern  . None  Social History Narrative  . None     Family History:  The patient's family history includes Cancer in her brother; Heart failure in her mother and sister; Hypertension in her mother; Kidney disease in her daughter; Stroke in her father.   ROS:   Please see the history of present illness.    ROS All other systems reviewed and are negative.   PHYSICAL EXAM:   VS:  BP 123/76   Pulse 84   Ht 5\' 2"  (1.575 m)   Wt 137 lb (62.1 kg)   BMI 25.06 kg/m     General: Alert, oriented x3, no distress, lean and fit Head: no evidence of trauma, PERRL, EOMI, no exophtalmos or lid lag, no myxedema, no xanthelasma; normal ears, nose and oropharynx Neck: normal jugular venous pulsations and no hepatojugular reflux; brisk carotid pulses without delay and no carotid bruits Chest: clear to auscultation, no signs of consolidation by percussion or palpation, normal fremitus, symmetrical and full respiratory excursions Cardiovascular: normal position and quality of the apical impulse, regular rhythm, normal first and second heart sounds, no murmurs, rubs or gallops,  Abdomen: no tenderness or distention, no masses by palpation, no abnormal pulsatility or arterial bruits, normal bowel sounds, no hepatosplenomegaly Extremities: no clubbing, cyanosis or edema; 2+ radial, ulnar and brachial pulses bilaterally; 2+ right femoral, posterior tibial and dorsalis pedis pulses; 2+ left femoral, posterior tibial and dorsalis pedis pulses; no subclavian or femoral bruits Neurological: grossly nonfocal Psych: Normal mood and affect  Wt Readings from Last 3 Encounters:  08/16/17 137 lb (62.1 kg)  02/15/17 136 lb (61.7 kg)  01/04/17 138 lb (62.6 kg)      Studies/Labs Reviewed:   EKG:  EKG is ordered today.  It shows atrial  fibrillation with complete heart block and 100% ventricular pacing  Recent Labs: 02/10/2017: BUN 16; Creatinine, Ser 0.90; Hemoglobin 12.8; Platelets 203; Potassium 4.2; Sodium 141   Lipid Panel  Component Value Date/Time   CHOL 110 (L) 04/04/2016 1415   TRIG 146 04/04/2016 1415   HDL 42 (L) 04/04/2016 1415   CHOLHDL 2.6 04/04/2016 1415   VLDL 29 04/04/2016 1415   LDLCALC 39 04/04/2016 1415   LDLDIRECT 56 06/08/2012 1120     ASSESSMENT:    No diagnosis found.   PLAN:  In order of problems listed above:  1. AFib: Persistent atrial fibrillation. Now going on for at least 4 months. She is completely unaware of the arrhythmia and rate control is not an issue due to complete heart block. There does not appear to be any indication for cardioversion or antiarrhythmic therapy. She has significant underlying structural heart disease with previous surgery. She does meet full criteria for anticoagulation, CHADSVasc 5 (age, gender, heart failure, diabetes mellitus, hypertension. Will start Eliquis 5 mg twice daily.  2. CHF: Well compensated on a very low dose of loop diuretic. Atrial fibrillation has had no impact on hemodynamic compensation. Appears euvolemic by clinical exam. NYHA functional class I-II. No change in medications. 3. CHB: She is pacemaker dependent and very sensitive to pacemaker interrogation. She feels very poorly even when she does downloads from home. Will see her back in the clinic in 3 months to see how she's doing with anticoagulation and to see if she should have device pacing mode switched. 4. PPM: Generator change roughly 6 months ago. Normal device function. If she remains in atrial fibrillation at her next appointment will switch to VVIR mode. 5. HLP: She does not have coronary/vascular disease but does have diabetes mellitus. LDL well within target range. 6. DM: Imperfect control.. 7. HTN: Good blood pressure control.     Medication Adjustments/Labs and Tests  Ordered: Current medicines are reviewed at length with the patient today.  Concerns regarding medicines are outlined above.  Medication changes, Labs and Tests ordered today are listed in the Patient Instructions below. There are no Patient Instructions on file for this visit.   Signed, Sanda Klein, MD  08/16/2017 11:22 AM    [facility] [address], [place], [place]  [address] Phone: [phone]; Fax: [phone]

## 2017-08-16 NOTE — Patient Instructions (Signed)
Dr Sallyanne Kuster has recommended making the following medication changes: 1. START Eliquis 5 mg - take 1 tablet by mouth twice daily  Your physician recommends that you schedule a follow-up appointment in 3 months with a pacemaker check.  If you need a refill on your cardiac medications before your next appointment, please call your pharmacy.

## 2017-08-17 DIAGNOSIS — I4819 Other persistent atrial fibrillation: Secondary | ICD-10-CM | POA: Insufficient documentation

## 2017-08-18 DIAGNOSIS — H401132 Primary open-angle glaucoma, bilateral, moderate stage: Secondary | ICD-10-CM | POA: Diagnosis not present

## 2017-08-31 ENCOUNTER — Telehealth: Payer: Self-pay

## 2017-08-31 NOTE — Telephone Encounter (Signed)
Patient left message on nurse line stating that [facility] has requested her meds 3 times without response. No request in PCP box. Attempted to call patient back to find out exactly which medications but no answer and voicemail is full. Will route to PCP to refill to [facility] on chronic medications. Danley Danker, RN [facility] RN)

## 2017-09-06 ENCOUNTER — Telehealth: Payer: Self-pay

## 2017-09-06 NOTE — Telephone Encounter (Signed)
Pt agreeable to apt on Friday at 2:00pm to change mode to VVIR

## 2017-09-06 NOTE — Telephone Encounter (Signed)
-----   Message from Sanda Klein, MD sent at 09/06/2017  3:05 PM EST ----- Please bring her into device clinic as soon as possible to switch to VVIR.  Preferably, this week. MCr

## 2017-09-07 ENCOUNTER — Other Ambulatory Visit: Payer: Self-pay

## 2017-09-07 DIAGNOSIS — I10 Essential (primary) hypertension: Secondary | ICD-10-CM

## 2017-09-07 DIAGNOSIS — I5030 Unspecified diastolic (congestive) heart failure: Secondary | ICD-10-CM

## 2017-09-07 DIAGNOSIS — J42 Unspecified chronic bronchitis: Secondary | ICD-10-CM

## 2017-09-07 NOTE — Telephone Encounter (Signed)
Patient left message on nurse line that [facility] is stating they have not heard back on rx request.Called patient back and she told me these are the meds she needs refilled. Routed to PCP. Danley Danker, RN [facility] RN)

## 2017-09-08 ENCOUNTER — Ambulatory Visit (INDEPENDENT_AMBULATORY_CARE_PROVIDER_SITE_OTHER): Payer: Self-pay | Admitting: *Deleted

## 2017-09-08 DIAGNOSIS — I4819 Other persistent atrial fibrillation: Secondary | ICD-10-CM

## 2017-09-08 DIAGNOSIS — I481 Persistent atrial fibrillation: Secondary | ICD-10-CM

## 2017-09-08 LAB — CUP PACEART INCLINIC DEVICE CHECK
Date Time Interrogation Session: 20190201143358
Implantable Lead Implant Date: 19990203
Implantable Lead Location: 753860
Implantable Lead Model: 5068
Implantable Lead Model: 5092
MDC IDC LEAD IMPLANT DT: 19990203
MDC IDC LEAD LOCATION: 753859
MDC IDC PG IMPLANT DT: 20180711

## 2017-09-08 MED ORDER — OMEPRAZOLE 20 MG PO CPDR: 20.0000 mg | DELAYED_RELEASE_CAPSULE | Freq: Two times a day (BID) | ORAL | 1 refills | Status: DC

## 2017-09-08 MED ORDER — ALBUTEROL SULFATE HFA 108 (90 BASE) MCG/ACT IN AERS: INHALATION_SPRAY | RESPIRATORY_TRACT | 3 refills | Status: DC

## 2017-09-08 MED ORDER — FLUTICASONE PROPIONATE HFA 110 MCG/ACT IN AERO: 1.0000 | INHALATION_SPRAY | Freq: Two times a day (BID) | RESPIRATORY_TRACT | 5 refills | Status: DC

## 2017-09-08 MED ORDER — SITAGLIPTIN PHOSPHATE 100 MG PO TABS: 100.0000 mg | ORAL_TABLET | Freq: Every morning | ORAL | 4 refills | Status: DC

## 2017-09-08 MED ORDER — LEVOTHYROXINE SODIUM 75 MCG PO TABS: ORAL_TABLET | ORAL | 4 refills | Status: DC

## 2017-09-08 MED ORDER — LOSARTAN POTASSIUM 25 MG PO TABS: 25.0000 mg | ORAL_TABLET | Freq: Every day | ORAL | 0 refills | Status: DC

## 2017-09-08 MED ORDER — GLIPIZIDE ER 10 MG PO TB24: 10.0000 mg | ORAL_TABLET | Freq: Every day | ORAL | 4 refills | Status: DC

## 2017-09-08 MED ORDER — METHOCARBAMOL 500 MG PO TABS: 500.0000 mg | ORAL_TABLET | Freq: Four times a day (QID) | ORAL | 0 refills | Status: DC | PRN

## 2017-09-08 MED ORDER — CITALOPRAM HYDROBROMIDE 20 MG PO TABS: 20.0000 mg | ORAL_TABLET | Freq: Every morning | ORAL | 0 refills | Status: DC

## 2017-09-08 MED ORDER — ROSUVASTATIN CALCIUM 20 MG PO TABS
20.0000 mg | ORAL_TABLET | Freq: Every day | ORAL | 4 refills | Status: DC
Start: 2017-09-08 — End: 2018-08-06

## 2017-09-08 MED ORDER — TRAZODONE HCL 50 MG PO TABS: 50.0000 mg | ORAL_TABLET | Freq: Every evening | ORAL | 1 refills | Status: DC | PRN

## 2017-09-08 MED ORDER — FUROSEMIDE 20 MG PO TABS: 20.0000 mg | ORAL_TABLET | Freq: Every morning | ORAL | 0 refills | Status: DC

## 2017-09-08 MED ORDER — METFORMIN HCL 500 MG PO TABS: ORAL_TABLET | ORAL | 4 refills | Status: DC

## 2017-09-08 MED ORDER — POTASSIUM CHLORIDE CRYS ER 20 MEQ PO TBCR: 20.0000 meq | EXTENDED_RELEASE_TABLET | Freq: Every day | ORAL | 1 refills | Status: DC

## 2017-09-08 MED ORDER — PROPRANOLOL HCL 40 MG PO TABS: 40.0000 mg | ORAL_TABLET | Freq: Every day | ORAL | 0 refills | Status: DC

## 2017-09-08 NOTE — Progress Notes (Signed)
Device check in clinic by industry. See scanned report for details. Mode changed to VVIR per [place] with [place] 4/10

## 2017-09-13 ENCOUNTER — Other Ambulatory Visit: Payer: Self-pay | Admitting: Cardiovascular Disease

## 2017-09-18 LAB — CUP PACEART INCLINIC DEVICE CHECK
Date Time Interrogation Session: 20190211174311
Implantable Lead Location: 753860
Implantable Lead Model: 5068
Implantable Lead Model: 5092
MDC IDC LEAD IMPLANT DT: 19990203
MDC IDC LEAD IMPLANT DT: 19990203
MDC IDC LEAD LOCATION: 753859
MDC IDC PG IMPLANT DT: 20180711

## 2017-09-19 ENCOUNTER — Ambulatory Visit (INDEPENDENT_AMBULATORY_CARE_PROVIDER_SITE_OTHER): Payer: Medicare Other | Admitting: Family Medicine

## 2017-09-19 ENCOUNTER — Encounter: Payer: Self-pay | Admitting: Family Medicine

## 2017-09-19 ENCOUNTER — Other Ambulatory Visit: Payer: Self-pay

## 2017-09-19 VITALS — BP 118/60 | HR 90 | Temp 98.3°F | Ht 62.0 in | Wt 135.0 lb

## 2017-09-19 DIAGNOSIS — Z130 Encounter for screening for diseases of the blood and blood-forming organs and certain disorders involving the immune mechanism: Secondary | ICD-10-CM

## 2017-09-19 DIAGNOSIS — E119 Type 2 diabetes mellitus without complications: Secondary | ICD-10-CM

## 2017-09-19 DIAGNOSIS — M25511 Pain in right shoulder: Secondary | ICD-10-CM | POA: Diagnosis not present

## 2017-09-19 DIAGNOSIS — E7800 Pure hypercholesterolemia, unspecified: Secondary | ICD-10-CM

## 2017-09-19 DIAGNOSIS — G8929 Other chronic pain: Secondary | ICD-10-CM | POA: Diagnosis not present

## 2017-09-19 DIAGNOSIS — I10 Essential (primary) hypertension: Secondary | ICD-10-CM

## 2017-09-19 LAB — POCT GLYCOSYLATED HEMOGLOBIN (HGB A1C): HEMOGLOBIN A1C: 13.7

## 2017-09-19 NOTE — Patient Instructions (Signed)
It was great seeing you today! We have addressed the following issues today  1. Decrease your omeprazole to once a day  for heartburn. 2. I will refer you to sports med for your shoulder injection. 3. I will refer to diabetic education to help with your diet. 4. I will see you in two months for recheck of your A1c 5. I will follow up on the results of your CBC, lipid panel, CMP.  If we did any lab work today, and the results require attention, either me or my nurse will get in touch with you. If everything is normal, you will get a letter in mail and a message via . If you don't hear from [place] in two weeks, please give [place] a call. Otherwise, we look forward to seeing you again at your next visit. If you have any questions or concerns before then, please call the clinic at [phone].  Please bring all your medications to every doctors visit  Sign up for My Chart to have easy access to your labs results, and communication with your Primary care physician. Please ask Front Desk for some assistance.   Please check-out at the front desk before leaving the clinic.    Take Care,   Dr. Andy Gauss

## 2017-09-19 NOTE — Progress Notes (Signed)
Subjective:    Patient ID: Mercedes Barr, female    DOB: Dec 19, 1945, 72 y.o.   MRN: [id number]   CC: Diabetes management follow-up  HPI: Patient is a 72 year old female with a past medical history significant for diabetes, COPD, asthma, arthritis, GERD, complete heart block with pacemaker, atrial fibrillation presents today to follow-up on diabetes management.  Patient reports that she has been eating a lot of fruits for the past few months and is found out they could raise her blood sugar.  Patient is convinced that her A1c is going to be higher than last check back in March 2018.  She has been adherent to her current regimen of metformin, glipizide, and Januvia.  Reports that she check her blood glucose 3 times a day and it has been elevated in the 200-300 range.  Patient denies any polyuria or polydipsia.  Right shoulder pain: She reports right shoulder pain for the past few weeks.  Patient has tried Tylenol and analgesic cream with only temporary relief.  Patient had similar symptoms on her left shoulder back in 2016 and received a steroid injection by Dr. Berkley Harvey.  Since then she has not had any symptoms on her left shoulder.  Patient is requesting steroid injection of her right shoulder.  Smoking status reviewed   ROS: all other systems were reviewed and are negative other than in the HPI   Past Medical History:  Diagnosis Date  . 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 Asthma   . Complete heart block ([place])   . COPD (chronic obstructive pulmonary disease) ([place])   . Coronary artery disease   . Diabetes mellitus   . GERD (gastroesophageal reflux disease)   . Heart murmur   . Hypertension   . Hypothyroidism   . Normal coronary arteries 2011  . Pacemaker    PACEMAKER DEPENDENT-DR. [place].  OFFICE NOTE DR. A. LITTLE STATES  "EXTREMELY PACEMAKER SENSITIVE AND WHEN YOU TRY TO CHECK FOR UNDERLYING RHYTHMS SHE WILL HAVE SNYCOPE AND WE HAVE NOT DONE THIS NOW  IN ABOUT 2 YEARS".  . Shortness of breath dyspnea    walking distance or climbing stairs    Past Surgical History:  Procedure Laterality Date  . ATRIOVENTRICULAR CUSHION DEFECT REPAIR  1988  . CARDIAC CATHETERIZATION  2011    SHOWED NO CAD-PER CARDIOLOGY OFFICE NOTES DR. A. LITTLE  . colonscopy      removed polyps   . INSERT / REPLACE / REMOVE PACEMAKER    . PACEMAKER INSERTION  1988   last gen 11/08- MDT  . PPM GENERATOR CHANGEOUT N/A 02/15/2017   Procedure: PPM Generator Changeout;  Surgeon: Sanda Klein, MD;  Location: [facility];  Service: Cardiovascular;  Laterality: N/A;  . TOTAL KNEE ARTHROPLASTY Right 11/19/2012   Procedure: RIGHT TOTAL KNEE ARTHROPLASTY;  Surgeon: Gearlean Alf, MD;  Location: [facility];  Service: Orthopedics;  Laterality: Right;  . TOTAL KNEE REVISION Right 09/24/2014   Procedure: RIGHT TOTAL KNEE ARTHROPLASTY REVISION;  Surgeon: Gearlean Alf, MD;  Location: [facility];  Service: Orthopedics;  Laterality: Right;  . TUBAL LIGATION      Past medical history, surgical, family, and social history reviewed and updated in the EMR as appropriate.  Objective:  There were no vitals taken for this visit.  Vitals and nursing note reviewed  General: NAD, pleasant, able to participate in exam Cardiac: RRR, normal heart sounds, no murmurs. 2+ radial and PT pulses bilaterally  Respiratory: CTAB, normal effort, No wheezes, rales or rhonchi Abdomen: soft, nontender, nondistended, no hepatic or splenomegaly, +BS Extremities: Right shoulder with good range of motion.  Mildly tender to palpation.  Empty can test negative.  No swelling or warmth noted Skin: warm and dry, no rashes noted Neuro: alert and oriented x4, no focal deficits Psych: Normal affect and mood   Assessment & Plan:   #Type 2 diabetes, uncontrolled A1c today is 13.7 increased from last A1c of 8.5.  Increase is likely secondary to poor dietary choices.  Patient would like further education on  diabetic nutrition.  She is motivated to make dietary choices.  We also discussed exercise plan.  Patient is determined to sepsis as a regular basis and will join a gym.  This has been her highest A1c in the past several years.  Patient usually around 7-8 range.  Will give patient a chance to modified her diet and will check her A1c in 2 months.  We will not make any changes to her current medication regimen.  We will refer her to diabetic education class.  #Right shoulder pain, chronic Patient presented with right shoulder pain for the past few weeks.  Symptoms are not relieved with rest and analgesia.  Exam is non-concerning for infection or tear or dislocation.  Likely secondary to arthritic changes in the shoulder joint.  Will refer to sports medicine for further management.   Marjie Skiff, MD [place] PGY-2

## 2017-09-20 ENCOUNTER — Encounter: Payer: Self-pay | Admitting: Family Medicine

## 2017-09-20 ENCOUNTER — Ambulatory Visit (INDEPENDENT_AMBULATORY_CARE_PROVIDER_SITE_OTHER): Payer: Medicare Other | Admitting: Family Medicine

## 2017-09-20 DIAGNOSIS — M7581 Other shoulder lesions, right shoulder: Secondary | ICD-10-CM | POA: Insufficient documentation

## 2017-09-20 LAB — CMP14+EGFR
ALBUMIN: 4.5 g/dL (ref 3.5–4.8)
ALT: 17 IU/L (ref 0–32)
AST: 21 IU/L (ref 0–40)
Albumin/Globulin Ratio: 1.7 (ref 1.2–2.2)
Alkaline Phosphatase: 57 IU/L (ref 39–117)
BILIRUBIN TOTAL: 0.4 mg/dL (ref 0.0–1.2)
BUN / CREAT RATIO: 11 — AB (ref 12–28)
BUN: 11 mg/dL (ref 8–27)
CHLORIDE: 98 mmol/L (ref 96–106)
CO2: 22 mmol/L (ref 20–29)
Calcium: 9.5 mg/dL (ref 8.7–10.3)
Creatinine, Ser: 0.97 mg/dL (ref 0.57–1.00)
GFR, EST AFRICAN AMERICAN: 68 mL/min/{1.73_m2} (ref >59)
GFR, EST NON AFRICAN AMERICAN: 59 mL/min/{1.73_m2} — AB (ref >59)
GLUCOSE: 422 mg/dL — AB (ref 65–99)
Globulin, Total: 2.6 g/dL (ref 1.5–4.5)
Potassium: 5.3 mmol/L — ABNORMAL HIGH (ref 3.5–5.2)
Sodium: 139 mmol/L (ref 134–144)
TOTAL PROTEIN: 7.1 g/dL (ref 6.0–8.5)

## 2017-09-20 LAB — CBC WITH DIFFERENTIAL/PLATELET
BASOS ABS: 0 10*3/uL (ref 0.0–0.2)
Basos: 0 %
EOS (ABSOLUTE): 0.1 10*3/uL (ref 0.0–0.4)
EOS: 2 %
Hematocrit: 45.4 % (ref 34.0–46.6)
Hemoglobin: 14.1 g/dL (ref 11.1–15.9)
IMMATURE GRANULOCYTES: 0 %
Immature Grans (Abs): 0 10*3/uL (ref 0.0–0.1)
Lymphocytes Absolute: 2.3 10*3/uL (ref 0.7–3.1)
Lymphs: 41 %
MCH: 27.3 pg (ref 26.6–33.0)
MCHC: 31.1 g/dL — ABNORMAL LOW (ref 31.5–35.7)
MCV: 88 fL (ref 79–97)
MONOS ABS: 0.4 10*3/uL (ref 0.1–0.9)
Monocytes: 7 %
NEUTROS PCT: 50 %
Neutrophils Absolute: 2.8 10*3/uL (ref 1.4–7.0)
PLATELETS: 219 10*3/uL (ref 150–379)
RBC: 5.16 x10E6/uL (ref 3.77–5.28)
RDW: 13.4 % (ref 12.3–15.4)
WBC: 5.6 10*3/uL (ref 3.4–10.8)

## 2017-09-20 LAB — LIPID PANEL
CHOL/HDL RATIO: 3 ratio (ref 0.0–4.4)
Cholesterol, Total: 92 mg/dL — ABNORMAL LOW (ref 100–199)
HDL: 31 mg/dL — ABNORMAL LOW (ref >39)
LDL CALC: 27 mg/dL (ref 0–99)
Triglycerides: 169 mg/dL — ABNORMAL HIGH (ref 0–149)
VLDL CHOLESTEROL CAL: 34 mg/dL (ref 5–40)

## 2017-09-20 MED ORDER — METHYLPREDNISOLONE ACETATE 80 MG/ML IJ SUSP
80.0000 mg | Freq: Once | INTRAMUSCULAR | Status: AC
  Administered 2017-09-20: 80 mg via INTRA_ARTICULAR

## 2017-09-20 NOTE — Assessment & Plan Note (Signed)
Patient is here with signs and symptoms consistent with right-sided rotator cuff tendinitis, specifically along the supraspinatus tendon.  Some microtears noted but no evidence of full-thickness tendon rupture. -Subacromial injection provided today. -Home exercise program involving Thera-Band and rotator cuff strengthening provided today.  Patient advised to withhold from performing these for at least 7 days. -Follow-up as needed

## 2017-09-20 NOTE — Progress Notes (Signed)
HPI  CC: Right shoulder pain Patient is here with complaints of right-sided shoulder pain.  She states that she has been dealing with this pain over the past 4-5 months, but now consistently over the past few weeks.  Pain is described as sharp and aching.  Located along the posterior lateral aspect of the shoulder.  She denies any trauma, injury, or event which may have caused this pain.  She endorses a history of frozen shoulder 2-3 years ago which she states she received a shoulder injection and began regaining her range of motion.  Patient endorses some limitations in range of motion secondary to pain.  She denies any neck pain.  No weakness, numbness, or paresthesias.  Previous Interventions Tried: Rest  Past Injuries: None Past Surgeries: Noncontributory Smoking: Non-smoker Family Hx: Noncontributory  ROS: Per HPI; in addition no fever, no rash, no additional weakness, no additional numbness, no additional paresthesias, and no additional falls/injury.   Objective: BP 127/71   Pulse 81   Ht 5\' 2"  (1.575 m)   Wt 135 lb (61.2 kg)   BMI 24.69 kg/m  Gen: NAD, well groomed, a/o x3, normal affect.  CV: Well-perfused. Warm.  Resp: Non-labored.  Neuro: Sensation intact throughout. No gross coordination deficits.  Gait: Nonpathologic posture, unremarkable stride without signs of limp or balance issues. Shoulder, Right: TTP noted at the posterior lateral aspect of the shoulder and AC joint. No evidence of bony deformity, asymmetry, or muscle atrophy; No tenderness over long head of biceps (bicipital groove). ROM limited in forward flexion up to 120 degrees, and abduction to about 90 degrees.  Otherwise ROM full. Strength 5/5 throughout. No abnormal scapular function observed. Sensation intact. Peripheral pulses intact.  Special Tests:   - Crossarm test: Positive   - Empty can: Positive   - Hawkins: Positive   - Obrien's test: Positive   - Yergason's: NEG   - Speeds test:  NEG  ULTRASOUND: Shoulder, Right  Diagnostic complete ultrasound imaging obtained of patient's right shoulder.  - No obvious evidence of bony deformity or osteophyte development appreciated.  - Long head of the biceps tendon: No evidence of tendon thickening, calcification, subluxation, or tearing in short or long axis views. No edema or bullseye sign.  - Subscapularis tendon: complete visualization across the width of the insertion point yielded no evidence of tendon thickening, or tears in the long axis view.  Very mild calcific changes noted near the insertion. - Supraspinatus tendon: complete visualization across the width of the insertion point yielded evidence of some mild tearing and edema with some microcalcifications present.  Moderate evidence of bursal inflammation appreciated.  - Infraspinatus and teres minor tendons: visualization across the width of the insertion points yielded no evidence of tendon thickening, calcification, or tears in the long axis view.  - AC Joint: Moderate evidence of osteophyte development appreciated. No effusion present.  IMPRESSION: findings consistent with supraspinatus tendinitis/tendinosis with possible microtear and AC joint osteoarthritis.  INJECTION: Right subacromial Patient was given informed consent, signed copy in the chart. Appropriate time out was taken. Area prepped and draped in usual sterile fashion. 1 cc of methylprednisolone 80 mg/ml plus  2 cc of Marcaine was injected into the subacromial space using a(n) posterior approach. The patient tolerated the procedure well. There were no complications. Post procedure instructions were given. **Procedure performed by Dr. Rosana Berger   Assessment and Plan:  Rotator cuff tendinitis, right Patient is here with signs and symptoms consistent with right-sided rotator cuff tendinitis,  specifically along the supraspinatus tendon.  Some microtears noted but no evidence of full-thickness tendon  rupture. -Subacromial injection provided today. -Home exercise program involving Thera-Band and rotator cuff strengthening provided today.  Patient advised to withhold from performing these for at least 7 days. -Follow-up as needed   Meds ordered this encounter  Medications  . methylPREDNISolone acetate (DEPO-MEDROL) injection 80 mg     Elberta Leatherwood, MD,MS [facility] 09/20/2017 5:06 PM

## 2017-10-04 ENCOUNTER — Other Ambulatory Visit: Payer: Self-pay | Admitting: Cardiovascular Disease

## 2017-10-06 ENCOUNTER — Other Ambulatory Visit: Payer: Self-pay | Admitting: Family Medicine

## 2017-10-13 ENCOUNTER — Ambulatory Visit: Payer: Medicare Other | Admitting: *Deleted

## 2017-10-19 ENCOUNTER — Other Ambulatory Visit: Payer: Self-pay

## 2017-10-19 DIAGNOSIS — E119 Type 2 diabetes mellitus without complications: Secondary | ICD-10-CM

## 2017-10-19 MED ORDER — GLUCOSE BLOOD VI STRP: ORAL_STRIP | 11 refills | Status: DC

## 2017-10-23 ENCOUNTER — Other Ambulatory Visit: Payer: Self-pay

## 2017-10-23 ENCOUNTER — Encounter: Payer: Self-pay | Admitting: Family Medicine

## 2017-10-23 ENCOUNTER — Ambulatory Visit (INDEPENDENT_AMBULATORY_CARE_PROVIDER_SITE_OTHER): Payer: Medicare Other | Admitting: Family Medicine

## 2017-10-23 VITALS — BP 110/60 | HR 88 | Temp 98.0°F | Wt 132.0 lb

## 2017-10-23 DIAGNOSIS — M7581 Other shoulder lesions, right shoulder: Secondary | ICD-10-CM

## 2017-10-23 DIAGNOSIS — E119 Type 2 diabetes mellitus without complications: Secondary | ICD-10-CM

## 2017-10-23 MED ORDER — GLUCOSE BLOOD VI STRP: ORAL_STRIP | 12 refills | Status: DC

## 2017-10-23 MED ORDER — MELOXICAM 7.5 MG PO TABS: 7.5000 mg | ORAL_TABLET | Freq: Every day | ORAL | 0 refills | Status: DC

## 2017-10-23 MED ORDER — GLUCOSE BLOOD VI STRP: ORAL_STRIP | 11 refills | Status: DC

## 2017-10-23 MED ORDER — POLYETHYLENE GLYCOL 3350 17 GM/SCOOP PO POWD: ORAL | 0 refills | Status: AC

## 2017-10-23 NOTE — Progress Notes (Signed)
Subjective:    Patient ID: Mercedes Barr, female    DOB: [birth year], 72 y.o.   MRN: [id number]   CC: Follow up for shoulder pain  HPI: Patient is 72 yo female here to follow for right shoulder pain. Patient was referred to sport medicine at last OV for right shoulder pain with decrease range of motion and tenderness with ADduction and ABduction. Patent was seen at [facility] and diagnosed with rotator cuff tendinitis. Patient also had some AC joint osteophyte seen on [place]. Patient received a subacromial steroid injection and reports that pain has been worse since, and she can barely move her arm. Patient reports doing the exercise with the Thera band. Patient has used some tylenol for the pain with minimal relief. She denies any swelling, warmth, fever or chills.  Smoking status reviewed   ROS: all other systems were reviewed and are negative other than in the HPI   Past Medical History:  Diagnosis Date  . 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 Asthma   . Complete heart block ([place])   . COPD (chronic obstructive pulmonary disease) ([place])   . Coronary artery disease   . Diabetes mellitus   . GERD (gastroesophageal reflux disease)   . Heart murmur   . Hypertension   . Hypothyroidism   . Normal coronary arteries 2011  . Pacemaker    PACEMAKER DEPENDENT-DR. Carrizales.  OFFICE NOTE DR. A. LITTLE STATES  "EXTREMELY PACEMAKER SENSITIVE AND WHEN YOU TRY TO CHECK FOR UNDERLYING RHYTHMS SHE WILL HAVE SNYCOPE AND WE HAVE NOT DONE THIS NOW IN ABOUT 2 YEARS".  . Shortness of breath dyspnea    walking distance or climbing stairs    Past Surgical History:  Procedure Laterality Date  . ATRIOVENTRICULAR CUSHION DEFECT REPAIR  1988  . CARDIAC CATHETERIZATION  2011    SHOWED NO CAD-PER CARDIOLOGY OFFICE NOTES DR. A. LITTLE  . colonscopy      removed polyps   . INSERT / REPLACE / REMOVE PACEMAKER    . PACEMAKER INSERTION  1988   last gen 11/08- MDT  . PPM  GENERATOR CHANGEOUT N/A 02/15/2017   Procedure: PPM Generator Changeout;  Surgeon: Sanda Klein, MD;  Location: [facility];  Service: Cardiovascular;  Laterality: N/A;  . TOTAL KNEE ARTHROPLASTY Right 11/19/2012   Procedure: RIGHT TOTAL KNEE ARTHROPLASTY;  Surgeon: Gearlean Alf, MD;  Location: [facility];  Service: Orthopedics;  Laterality: Right;  . TOTAL KNEE REVISION Right 09/24/2014   Procedure: RIGHT TOTAL KNEE ARTHROPLASTY REVISION;  Surgeon: Gearlean Alf, MD;  Location: [facility];  Service: Orthopedics;  Laterality: Right;  . TUBAL LIGATION      Past medical history, surgical, family, and social history reviewed and updated in the EMR as appropriate.  Objective:  BP 110/60   Pulse 88   Temp 98 F (36.7 C) (Oral)   Wt 132 lb (59.9 kg)   SpO2 93%   BMI 24.14 kg/m   Vitals and nursing note reviewed  General: NAD, pleasant, able to participate in exam Cardiac: RRR, normal heart sounds, no murmurs. 2+ radial and PT pulses bilaterally Respiratory: CTAB, normal effort, No wheezes, rales or rhonchi Abdomen: soft, nontender, nondistended, no hepatic or splenomegaly, +BS Extremities: Left shoulder exam was normal, right shoulder exam was very limited due to pain. Patient barely able to abduct. Tender to palpation at the shoulder joint. Skin: warm and dry, no rashes noted Neuro: alert and  oriented x4, no focal deficits Psych: Normal affect and mood   Assessment & Plan:    Rotator cuff tendinitis, right Patient recently diagnosed with rotator cuff tendinitis and received a steroid injection. Patient has also followed Thera Band exercise at home. Today, pain is worse and exam is very limited. Minimal concern for septic joint, patient also had findings of osteophytes at the [facility] joint and this could be a progression of possible arthritis. No trauma reported or repetitive work that could have cause ligament/tendon damage. Likely bursitis. Patient will need further  imaging. --Schedule visit at the [facility] on 3/22 with Dr. Nori Riis for repeat US and further management --Will not prescribe Meloxicam due to history of allergy, and increase risk of bleeding (patient currently on Eliquis) and coronary events (pacemaker).    Marjie Skiff, MD [place] PGY-2

## 2017-10-23 NOTE — Assessment & Plan Note (Signed)
Patient recently diagnosed with rotator cuff tendinitis and received a steroid injection. Patient has also followed Thera Band exercise at home. Today, pain is worse and exam is very limited. Minimal concern for septic joint, patient also had findings of osteophytes at the [facility] joint and this could be a progression of possible arthritis. No trauma reported or repetitive work that could have cause ligament/tendon damage. Likely bursitis. Patient will need further imaging. --Schedule visit at the [facility] on 3/22 with Dr. Nori Riis for repeat US and further management --Will not prescribe Meloxicam due to history of allergy, and increase risk of bleeding (patient currently on Eliquis) and coronary events (pacemaker).

## 2017-10-23 NOTE — Patient Instructions (Addendum)
It was great seeing you today! We have addressed the following issues today  1. I will not prescribe a medication to calm the inflammation due to the risk of bleeding and your cardiac history in addition of being allergic. I schedule you at the  sport medicine clinic Friday morning.   If we did any lab work today, and the results require attention, either me or my nurse will get in touch with you. If everything is normal, you will get a letter in mail and a message via . If you don't hear from [place] in two weeks, please give [place] a call. Otherwise, we look forward to seeing you again at your next visit. If you have any questions or concerns before then, please call the clinic at [phone].  Please bring all your medications to every doctors visit  Sign up for My Chart to have easy access to your labs results, and communication with your Primary care physician. Please ask Front Desk for some assistance.   Please check-out at the front desk before leaving the clinic.    Take Care,   Dr. Andy Gauss

## 2017-10-27 ENCOUNTER — Other Ambulatory Visit: Payer: Self-pay | Admitting: Family Medicine

## 2017-10-27 ENCOUNTER — Ambulatory Visit: Payer: Self-pay

## 2017-10-27 ENCOUNTER — Telehealth: Payer: Self-pay

## 2017-10-27 ENCOUNTER — Encounter: Payer: Self-pay | Admitting: Family Medicine

## 2017-10-27 ENCOUNTER — Ambulatory Visit (INDEPENDENT_AMBULATORY_CARE_PROVIDER_SITE_OTHER): Payer: Medicare Other | Admitting: Family Medicine

## 2017-10-27 VITALS — BP 121/68 | Ht 62.0 in | Wt 135.0 lb

## 2017-10-27 DIAGNOSIS — I5030 Unspecified diastolic (congestive) heart failure: Secondary | ICD-10-CM

## 2017-10-27 DIAGNOSIS — M7581 Other shoulder lesions, right shoulder: Secondary | ICD-10-CM

## 2017-10-27 DIAGNOSIS — G8929 Other chronic pain: Secondary | ICD-10-CM

## 2017-10-27 DIAGNOSIS — M25511 Pain in right shoulder: Secondary | ICD-10-CM

## 2017-10-27 DIAGNOSIS — I10 Essential (primary) hypertension: Secondary | ICD-10-CM

## 2017-10-27 NOTE — Telephone Encounter (Signed)
Spoke with [place] [facility] and changed order to without contrast

## 2017-10-27 NOTE — Progress Notes (Signed)
  Mercedes Barr - 72 y.o. female MRN [id number]  Date of birth: [birth year]    SUBJECTIVE:      Chief Complaint:/ HPI:   RIght shoulder pain Had subacromial inj and no improvement, in fact pain is worse. Pain with attempt at sleep---cannot get comfortable Pain with any motion of arm / shoulder Right hand dominant Pain 8/10 most of the time Nothing making it better No hx trauma   ROS:     Pertinent review of systems: negative for fever or unusual weight change. No numbness in RUE or right hand No rash No headache No chest pain  PERTINENT  PMH / PSH FH / / SH:  Past Medical, Surgical, Social, and Family History Reviewed & Updated in the EMR.  Pertinent findings include:  Hx pacemaker placement x 2 (one was right sided and those leads still present). Pacer dependent. DM2 hypothyroid  OBJECTIVE: BP 121/68   Ht 5\' 2"  (1.575 m)   Wt 135 lb (61.2 kg)   BMI 24.69 kg/m   Physical Exam:  Vital signs are reviewed. Vital signs reviewed. GENERAL: Well-developed, well-nourished, no acute distress. SHOULDERS: inspection reveals symmetry Left shoulder FROM and normal strength all planes rotator cuff Right shoulder ROM is limited by extreme pain with any abduction above 90 degrees, any forward flexion above 100 degrees. Internal and external  rotation very painful but can achieve passive from. Nontender bicep tendon area. No defect right bicep muscle belly VASC radial pulses 2+ B= SKIN no rash, no lesions, no unusual warmth of right shoulder area NEURO intact sensation soft touch bilateral hands  [place]: AC joint mild arthropathy, no effusion.Bicep tendon is intact, small amount of fluid in tendon sheath. All muscles of rotator cuff appear to be intact although exam is limited by patient discomfort. Significant increased pain with impingement testing and this reproduces her pain. There is a sharp osteophyte noted impinging in supraspinatus with movement on humeral surface of muscle/tendon.  There are scattered calcification throughout the subscapularis and supraspinatus muscles    ASSESSMENT & PLAN:  See problem based charting & AVS for pt instructions.

## 2017-10-27 NOTE — Assessment & Plan Note (Deleted)
I suspect impingement--possible spurring. Cannot get MRI due to pacer. [place] was difficult for her to tolerate--willl get CT scan and call her with results after CT. She would be very interested in surgery as she wasn't this to be fixed ASAP

## 2017-10-27 NOTE — Assessment & Plan Note (Signed)
I suspect impingement--possible spurring. Cannot get MRI due to pacer. [place] was difficult for her to tolerate--willl get CT scan and call her with results after CT. She would be very interested in surgery as she wasn't this to be fixed ASAP

## 2017-10-27 NOTE — Telephone Encounter (Signed)
Mercedes Barr with [facility] called requesting clarification on CT right shoulder with contrast.  For the diagnosis of chronic right shoulder pain the test order is usually CT WITHOUT contrast or CT WITH AND WITHOUT contrast. Please re-enter one of these orders.  If you are actually wanting an arthrogram which includes contrast it would have to be ordered as an arthrogram.  Her number for questions is [phone].  Danley Danker, RN [facility] RN)

## 2017-11-02 ENCOUNTER — Ambulatory Visit
Admission: RE | Admit: 2017-11-02 | Discharge: 2017-11-02 | Disposition: A | Discharge status: Home / Self Care | Payer: Medicare Other | Source: Ambulatory Visit | Attending: Family Medicine | Admitting: Family Medicine

## 2017-11-02 DIAGNOSIS — M25511 Pain in right shoulder: Secondary | ICD-10-CM

## 2017-11-03 ENCOUNTER — Telehealth: Payer: Self-pay | Admitting: Family Medicine

## 2017-11-03 NOTE — Telephone Encounter (Signed)
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 Will schedule her for serial US guided shoulder injections and she is OK wit this paln. I will have office call her and set up next week  She also had small hazy area in lung--I will let PCP know. Consider f/u chest imaging in 4-6 weeks. She has underlying COPD and says she has a lot of cough. Could also consider empiric round abx therapy. I will let Dr Andy Gauss know

## 2017-11-06 ENCOUNTER — Other Ambulatory Visit: Payer: Self-pay | Admitting: Family Medicine

## 2017-11-06 MED ORDER — AZITHROMYCIN 250 MG PO TABS: ORAL_TABLET | ORAL | 0 refills | Status: AC

## 2017-11-06 NOTE — Telephone Encounter (Signed)
Pt would like to speak with MD about her CT results from last week. Has some questions and concerns. Wallace Cullens, RN

## 2017-11-07 NOTE — Telephone Encounter (Signed)
Discussed CT results with patient and next course of action. Patient in agreement with plan.  Thank you  Marjie Skiff, MD [place], PGY-2

## 2017-11-10 ENCOUNTER — Ambulatory Visit: Payer: Self-pay

## 2017-11-10 ENCOUNTER — Ambulatory Visit (INDEPENDENT_AMBULATORY_CARE_PROVIDER_SITE_OTHER): Payer: Medicare Other | Admitting: Family Medicine

## 2017-11-10 VITALS — BP 109/70 | Ht 62.5 in | Wt 133.0 lb

## 2017-11-10 DIAGNOSIS — M25551 Pain in right hip: Secondary | ICD-10-CM | POA: Diagnosis not present

## 2017-11-10 DIAGNOSIS — M25511 Pain in right shoulder: Secondary | ICD-10-CM

## 2017-11-10 DIAGNOSIS — G8929 Other chronic pain: Secondary | ICD-10-CM

## 2017-11-10 MED ORDER — METHYLPREDNISOLONE ACETATE 40 MG/ML IJ SUSP
40.0000 mg | Freq: Once | INTRAMUSCULAR | Status: AC
  Administered 2017-11-10: 40 mg via INTRA_ARTICULAR

## 2017-11-10 MED ORDER — TRAMADOL HCL 50 MG PO TABS: ORAL_TABLET | ORAL | 2 refills | Status: DC

## 2017-11-10 NOTE — Assessment & Plan Note (Signed)
tramadol 

## 2017-11-10 NOTE — Progress Notes (Signed)
    CHIEF COMPLAINT / HPI: Here today for follow-up of right shoulder pain.  We had talked about doing a glenohumeral joint injection.  Also having some right hip pain.  Not sure what this is.  Has had off and on.  Aching, worse with activity.  REVIEW OF SYSTEMS: No unusual weight change.  No fever.  No unusual numbness or tingling in her upper extremity.   PERTINENT  PMH / PSH: I have reviewed the patient's medications, allergies, past medical and surgical history, smoking status and updated in the EMR as appropriate.   OBJECTIVE: GENERAL: Well-developed thin female no acute distress HIP: Right: Internal rotation somewhat limited.  External rotation is limited and painful.  She has normal hip flexion.  NEURO: Intact sensation soft touch bilateral hands and lower extremity  PROCEDURE: INJECTION: Patient was given informed consent, signed copy in the chart. Appropriate time out was taken. Area prepped and draped in usual sterile fashion. Ethyl chloride was  used for local anesthesia. A 21 gauge 1 1/2 inch needle was used.. 1 cc of methylprednisolone 40 mg/ml plus 6 cc of 1% lidocaine without epinephrine was injected into the glenohumeral joint using a(n) ultrasound-guided approach.   The patient tolerated the procedure well. There were no complications. Post procedure instructions were given.   ASSESSMENT / PLAN: Plan joint injection under ultrasound.  We will have her do pendulum exercises and see her back in 2 weeks. 2.  Right hip pain which I think is degenerative joint disease given her exam. We will try her on low-dose tramadol

## 2017-11-15 ENCOUNTER — Encounter: Payer: Self-pay | Admitting: Cardiovascular Disease

## 2017-11-15 ENCOUNTER — Ambulatory Visit (INDEPENDENT_AMBULATORY_CARE_PROVIDER_SITE_OTHER): Payer: Medicare Other | Admitting: Cardiovascular Disease

## 2017-11-15 VITALS — BP 122/76 | HR 82 | Ht 62.0 in | Wt 131.6 lb

## 2017-11-15 DIAGNOSIS — E119 Type 2 diabetes mellitus without complications: Secondary | ICD-10-CM | POA: Diagnosis not present

## 2017-11-15 DIAGNOSIS — E7800 Pure hypercholesterolemia, unspecified: Secondary | ICD-10-CM | POA: Diagnosis not present

## 2017-11-15 DIAGNOSIS — I5032 Chronic diastolic (congestive) heart failure: Secondary | ICD-10-CM | POA: Diagnosis not present

## 2017-11-15 DIAGNOSIS — I442 Atrioventricular block, complete: Secondary | ICD-10-CM | POA: Diagnosis not present

## 2017-11-15 DIAGNOSIS — Z950 Presence of cardiac pacemaker: Secondary | ICD-10-CM | POA: Diagnosis not present

## 2017-11-15 DIAGNOSIS — Z7901 Long term (current) use of anticoagulants: Secondary | ICD-10-CM

## 2017-11-15 DIAGNOSIS — I481 Persistent atrial fibrillation: Secondary | ICD-10-CM | POA: Diagnosis not present

## 2017-11-15 DIAGNOSIS — I10 Essential (primary) hypertension: Secondary | ICD-10-CM

## 2017-11-15 DIAGNOSIS — I4819 Other persistent atrial fibrillation: Secondary | ICD-10-CM

## 2017-11-15 NOTE — Progress Notes (Signed)
Patient ID: Mercedes Barr, female   DOB: Mar 27, 1946, 72 y.o.   MRN: [id number]    Cardiology Office Note    Date:  11/19/2017   ID:  [name], DOB [birth year], MRN [id number]  PCP:  Marjie Skiff, MD  Cardiologist:   Sanda Klein, MD   No chief complaint on file.   History of Present Illness:  Mercedes Barr is a 72 y.o. female with complete heart block, pacemaker dependent, history of endocardial cushion defect repair, heart failure with preserved left ventricular systolic function.  She is done well since her last appointment without need for hospitalization, emergency room visits or change in her cardiac medications.  She had a CT that was ordered for some chronic shoulder pain and was incidentally found to have an infection in her chest.  She was prescribed azithromycin.  She has received steroid injections in her joints.  This is possibly the reason of her diabetes control has drastically deteriorated with a hemoglobin A1c that was recently 13%.  The patient specifically denies any chest pain at rest exertion, dyspnea at rest or with exertion, orthopnea, paroxysmal nocturnal dyspnea, syncope, palpitations, focal neurological deficits, intermittent claudication, lower extremity edema, unexplained weight gain, cough, hemoptysis or wheezing.  Her current pacemaker was a generator change out performed 2018.  Device function is normal.  Estimated generator longevity is 12 years.  Due to complete heart block she has 100% ventricular pacing.  She has been in uninterrupted atrial fibrillation since September 2018.  There are no episodes of high ventricular rates.  She is pacemaker dependent.  She does not like to do remote pacemaker downloads because of the uneasy sensation she has whenever a magnet is placed over her device.  She had surgical repair of atrioventricular cushion defect repair in 1988 when she was 72 years old. Surgery was complicated by development of complete heart  block and she received a pacemaker (her initial device probably was an abdominal generator with epicardial leads). A right subclavian pacemaker was placed in 1991, but had to be removed because of complaints of pain. She had a complete revision of the system in 1999 and her current left subclavian atrial and ventricular leads date to that time. Her most recent generator change out occurred in 2008. The atrial lead is a Medtronic [id number]. The ventricular lead is Medtronic [id number].  She underwent a pacemaker generator change out in 2018, with a Tyrx pouch.  Past Medical History:  Diagnosis Date  . 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 Asthma   . Complete heart block ([place])   . COPD (chronic obstructive pulmonary disease) ([place])   . Coronary artery disease   . Diabetes mellitus   . GERD (gastroesophageal reflux disease)   . Heart murmur   . Hypertension   . Hypothyroidism   . Normal coronary arteries 2011  . Pacemaker    PACEMAKER DEPENDENT-DR. [place].  OFFICE NOTE DR. A. LITTLE STATES  "EXTREMELY PACEMAKER SENSITIVE AND WHEN YOU TRY TO CHECK FOR UNDERLYING RHYTHMS SHE WILL HAVE SNYCOPE AND WE HAVE NOT DONE THIS NOW IN ABOUT 2 YEARS".  . Shortness of breath dyspnea    walking distance or climbing stairs    Past Surgical History:  Procedure Laterality Date  . ATRIOVENTRICULAR CUSHION DEFECT REPAIR  1988  . CARDIAC CATHETERIZATION  2011    SHOWED NO CAD-PER CARDIOLOGY OFFICE NOTES DR. A. LITTLE  . colonscopy      removed polyps   .  INSERT / REPLACE / REMOVE PACEMAKER    . PACEMAKER INSERTION  1988   last gen 11/08- MDT  . PPM GENERATOR CHANGEOUT N/A 02/15/2017   Procedure: PPM Generator Changeout;  Surgeon: Sanda Klein, MD;  Location: [facility];  Service: Cardiovascular;  Laterality: N/A;  . TOTAL KNEE ARTHROPLASTY Right 11/19/2012   Procedure: RIGHT TOTAL KNEE ARTHROPLASTY;  Surgeon: Gearlean Alf, MD;  Location: [facility];  Service: Orthopedics;   Laterality: Right;  . TOTAL KNEE REVISION Right 09/24/2014   Procedure: RIGHT TOTAL KNEE ARTHROPLASTY REVISION;  Surgeon: Gearlean Alf, MD;  Location: [facility];  Service: Orthopedics;  Laterality: Right;  . TUBAL LIGATION      Current Medications: Outpatient Medications Prior to Visit  Medication Sig Dispense Refill  . acetaminophen (TYLENOL) 325 MG tablet Take 650 mg by mouth every 6 (six) hours as needed for moderate pain (Right knee pain).    Marland Kitchen albuterol (PROAIR HFA) 108 (90 Base) MCG/ACT inhaler Use 2 puffs every 4 hours  as needed for wheezing 25.5 g 3  . albuterol (PROVENTIL) (2.5 MG/3ML) 0.083% nebulizer solution USE 1 VIAL VIA NEBULIZER  EVERY 4 HOURS AS NEEDED FOR WHEEZING 150 mL 0  . aspirin 81 MG tablet Take 81 mg by mouth daily.    . citalopram (CELEXA) 20 MG tablet Take 1 tablet (20 mg total) by mouth every morning. 30 tablet 0  . ELIQUIS 5 MG TABS tablet TAKE 1 TABLET BY MOUTH TWICE A DAY 60 tablet 8  . EPIPEN 2-PAK 0.3 MG/0.3ML SOAJ injection Inject 0.3 mLs (0.3 mg  total) into the muscle as  needed for severe allergic  reaction, and contact  medical provider. 2 Device 0  . fluticasone (FLOVENT HFA) 110 MCG/ACT inhaler Inhale 1 puff into the lungs 2 (two) times daily. 1 Inhaler 5  . furosemide (LASIX) 20 MG tablet TAKE 1 TABLET BY MOUTH  EVERY MORNING 90 tablet 0  . glipiZIDE (GLIPIZIDE XL) 10 MG 24 hr tablet Take 1 tablet (10 mg total) by mouth daily with breakfast. 90 tablet 4  . glucose blood (ONE TOUCH ULTRA TEST) test strip Use for blood sugar testing 1-3 times daily 100 each 12  . Lancets MISC Use with blood sugar monitor. 100 each 11  . levothyroxine (SYNTHROID, LEVOTHROID) 75 MCG tablet Take 1 tablet by mouth  daily before breakfast 90 tablet 4  . losartan (COZAAR) 25 MG tablet TAKE 1 TABLET BY MOUTH  DAILY 90 tablet 0  . meloxicam (MOBIC) 7.5 MG tablet Take 1 tablet (7.5 mg total) by mouth daily. 30 tablet 0  . metFORMIN (GLUCOPHAGE) 500 MG tablet TAKE 2 TABLETS BY  MOUTH 2  TIMES DAILY WITH MEALS. 360 tablet 4  . methocarbamol (ROBAXIN) 500 MG tablet Take 1 tablet (500 mg total) by mouth every 6 (six) hours as needed for muscle spasms. 80 tablet 0  . Omega-3 Fatty Acids (FISH OIL) 1000 MG CAPS Take 1 capsule by mouth every morning. Reported on 02/11/2016    . omeprazole (PRILOSEC) 20 MG capsule TAKE 1 CAPSULE BY MOUTH TWO TIMES DAILY 180 capsule 0  . polyethylene glycol powder (GLYCOLAX/MIRALAX) powder TAKE 17 GRAMS BY MOUTH 2  TIMES DAILY AS NEEDED FOR  LAXATIVE 1054 g 0  . potassium chloride SA (K-DUR,KLOR-CON) 20 MEQ tablet Take 1 tablet (20 mEq total) by mouth daily. 90 tablet 1  . propranolol (INDERAL) 40 MG tablet Take 1 tablet (40 mg total) by mouth daily. 90 tablet 1  .  rosuvastatin (CRESTOR) 20 MG tablet Take 1 tablet (20 mg total) by mouth daily. 90 tablet 4  . sitaGLIPtin (JANUVIA) 100 MG tablet Take 1 tablet (100 mg total) by mouth every morning. 90 tablet 4  . traMADol (ULTRAM) 50 MG tablet Take one by mouth twice a day for arthritis pain 60 tablet 2  . Travoprost, BAK Free, (TRAVATAN) 0.004 % SOLN ophthalmic solution Place 1 drop into both eyes at bedtime. 1 Bottle 5  . traZODone (DESYREL) 50 MG tablet TAKE 1 TABLET BY MOUTH AT  BEDTIME AS NEEDED FOR SLEEP 60 tablet 0  . triamcinolone cream (KENALOG) 0.5 % APPLY TOPICALLY TO ITCHY AREAS TWICE DAILY AS NEEDED FOR ITCHING UNTIL 2 TO 3 DAYS AFTER HEALED; Order number [id number] 15 g 0  . ELIQUIS 5 MG TABS tablet TAKE 1 TABLET BY MOUTH TWO  TIMES DAILY 180 tablet 0  . omeprazole (PRILOSEC) 20 MG capsule Take 1 capsule (20 mg total) by mouth 2 (two) times daily. 180 capsule 1  . ondansetron (ZOFRAN) 4 MG tablet Take 1 tablet (4 mg total) by mouth every 8 (eight) hours as needed for nausea. (Patient not taking: Reported on 11/15/2017) 20 tablet 0  . propranolol (INDERAL) 40 MG tablet TAKE 1 TABLET BY MOUTH  DAILY 90 tablet 0  . traZODone (DESYREL) 50 MG tablet Take 1 tablet (50 mg total) by mouth at  bedtime as needed for sleep. 60 tablet 1   No facility-administered medications prior to visit.      Allergies:   Chlorhexidine; Naproxen; Pregabalin; Propoxyphene n-acetaminophen; Statins; Amitriptyline; Amitriptyline hcl; Benazepril; Benazepril hcl; Gabapentin; Oxycodone-acetaminophen; Propoxyphene; and Tylox [oxycodone-acetaminophen]   Social History   Socioeconomic History  . Marital status: Divorced    Spouse name: Not on file  . Number of children: Not on file  . Years of education: Not on file  . Highest education level: Not on file  Occupational History  . Not on file  Social Needs  . Financial resource strain: Not on file  . Food insecurity:    Worry: Not on file    Inability: Not on file  . Transportation needs:    Medical: Not on file    Non-medical: Not on file  Tobacco Use  . Smoking status: Never Smoker  . Smokeless tobacco: Never Used  Substance and Sexual Activity  . Alcohol use: No  . Drug use: No  . Sexual activity: Never  Lifestyle  . Physical activity:    Days per week: Not on file    Minutes per session: Not on file  . Stress: Not on file  Relationships  . Social connections:    Talks on phone: Not on file    Gets together: Not on file    Attends religious service: Not on file    Active member of club or organization: Not on file    Attends meetings of clubs or organizations: Not on file    Relationship status: Not on file  Other Topics Concern  . Not on file  Social History Narrative  . Not on file     Family History:  The patient's family history includes Cancer in her brother; Heart failure in her mother and sister; Hypertension in her mother; Kidney disease in her daughter; Stroke in her father.   ROS:   Please see the history of present illness.    ROS All other systems reviewed and are negative.   PHYSICAL EXAM:   VS:  BP 122/76   Pulse  82   Ht 5\' 2"  (1.575 m)   Wt 131 lb 9.6 oz (59.7 kg)   SpO2 97%   BMI 24.07 kg/m      General: Alert, oriented x3, no distress, lean and fit Head: no evidence of trauma, PERRL, EOMI, no exophtalmos or lid lag, no myxedema, no xanthelasma; normal ears, nose and oropharynx Neck: normal jugular venous pulsations and no hepatojugular reflux; brisk carotid pulses without delay and no carotid bruits Chest: clear to auscultation, no signs of consolidation by percussion or palpation, normal fremitus, symmetrical and full respiratory excursions Cardiovascular: normal position and quality of the apical impulse, regular rhythm, normal first and second heart sounds, no murmurs, rubs or gallops,  Abdomen: no tenderness or distention, no masses by palpation, no abnormal pulsatility or arterial bruits, normal bowel sounds, no hepatosplenomegaly Extremities: no clubbing, cyanosis or edema; 2+ radial, ulnar and brachial pulses bilaterally; 2+ right femoral, posterior tibial and dorsalis pedis pulses; 2+ left femoral, posterior tibial and dorsalis pedis pulses; no subclavian or femoral bruits Neurological: grossly nonfocal Psych: Normal mood and affect  Wt Readings from Last 3 Encounters:  11/15/17 131 lb 9.6 oz (59.7 kg)  11/10/17 133 lb (60.3 kg)  10/27/17 135 lb (61.2 kg)      Studies/Labs Reviewed:   EKG:  EKG is ordered today.  It shows atrial fibrillation , ventricular pacing  Recent Labs: 09/19/2017: ALT 17; BUN 11; Creatinine, Ser 0.97; Hemoglobin 14.1; Platelets 219; Potassium 5.3; Sodium 139   Lipid Panel    Component Value Date/Time   CHOL 92 (L) 09/19/2017 1414   TRIG 169 (H) 09/19/2017 1414   HDL 31 (L) 09/19/2017 1414   CHOLHDL 3.0 09/19/2017 1414   CHOLHDL 2.6 04/04/2016 1415   VLDL 29 04/04/2016 1415   LDLCALC 27 09/19/2017 1414   LDLDIRECT 56 06/08/2012 1120     ASSESSMENT:    1. Persistent atrial fibrillation ([place])   2. Long term (current) use of anticoagulants   3. Chronic diastolic heart failure ([place])   4. Complete heart block ([place])   5. Pacemaker    6. Hypercholesteremia   7. Diabetes mellitus type 2 in nonobese (HCC)   8. Essential hypertension      PLAN:  In order of problems listed above:  1. AFib: Persistent atrial fibrillation.  Since the rhythm is asymptomatic and rate control is not an issue, antiarrhythmics do not appear to be justified.  She does meet full criteria for anticoagulation, CHADSVasc 5 (age, gender, heart failure, diabetes mellitus, hypertension.  2. Eliquis 5 mg twice daily, without any bleeding problems. 3. CHF: Appears euvolemic, but functional status on a very low dose of loop diuretic. Atrial fibrillation has had no impact on hemodynamic compensation.  4. CHB: She is pacemaker dependent and very sensitive to pacemaker interrogation.  5. PPM: Generator change July 2018. Normal device function. She feels very poorly even when she does downloads from home.  Will alternate device clinic visits with visit with me in the office. 6. HLP:  LDL C well controlled 7. DM: Glycemic control has deteriorated drastically.  This may be due to steroid joint injections.  Ideally, since she has cardiac disease, consider use of late SGL T-2 inhibitor such as Jardiance.  If such a medication is initiated, we may even be able to discontinue her low-dose of loop diuretic. 8. HTN: Well-controlled.     Medication Adjustments/Labs and Tests Ordered: Current medicines are reviewed at length with the patient today.  Concerns regarding medicines are  outlined above.  Medication changes, Labs and Tests ordered today are listed in the Patient Instructions below. Patient Instructions  Dr Sallyanne Kuster recommends that you continue on your current medications as directed. Please refer to the Current Medication list given to you today.  Remote monitoring is used to monitor your Pacemaker or ICD from home. This monitoring reduces the number of office visits required to check your device to one time per year. It allows [place] to keep an eye on the  functioning of your device to ensure it is working properly. You are scheduled for a device check from home on Wednesday, July 10th, 2019. You may send your transmission at any time that day. If you have a wireless device, the transmission will be sent automatically. After your physician reviews your transmission, you will receive a notification with your next transmission date.  Dr Sallyanne Kuster recommends that you schedule a follow-up appointment in 12 months with a pacemaker check. You will receive a reminder letter in the mail two months in advance. If you don't receive a letter, please call our office to schedule the follow-up appointment.  If you need a refill on your cardiac medications before your next appointment, please call your pharmacy.    Signed, Sanda Klein, MD  11/19/2017 8:46 PM    [facility] [address], [place], [place]  [address] Phone: [phone]; Fax: [phone]

## 2017-11-15 NOTE — Patient Instructions (Signed)

## 2017-11-20 ENCOUNTER — Other Ambulatory Visit: Payer: Self-pay | Admitting: Family Medicine

## 2017-11-30 DIAGNOSIS — E119 Type 2 diabetes mellitus without complications: Secondary | ICD-10-CM | POA: Diagnosis not present

## 2017-11-30 DIAGNOSIS — Z7984 Long term (current) use of oral hypoglycemic drugs: Secondary | ICD-10-CM | POA: Diagnosis not present

## 2017-11-30 DIAGNOSIS — H401132 Primary open-angle glaucoma, bilateral, moderate stage: Secondary | ICD-10-CM | POA: Insufficient documentation

## 2017-11-30 DIAGNOSIS — H2513 Age-related nuclear cataract, bilateral: Secondary | ICD-10-CM | POA: Diagnosis not present

## 2017-11-30 LAB — HM DIABETES EYE EXAM

## 2017-12-08 ENCOUNTER — Encounter: Payer: Self-pay | Admitting: Family Medicine

## 2017-12-08 ENCOUNTER — Ambulatory Visit (INDEPENDENT_AMBULATORY_CARE_PROVIDER_SITE_OTHER): Payer: Medicare Other | Admitting: Family Medicine

## 2017-12-08 VITALS — BP 112/68 | Ht 62.0 in | Wt 132.0 lb

## 2017-12-08 DIAGNOSIS — M7581 Other shoulder lesions, right shoulder: Secondary | ICD-10-CM

## 2017-12-08 NOTE — Progress Notes (Signed)
   [facility] Phone: [phone]  Patient had to leave for another appt before being seen

## 2017-12-15 ENCOUNTER — Other Ambulatory Visit: Payer: Self-pay

## 2017-12-15 DIAGNOSIS — M25511 Pain in right shoulder: Principal | ICD-10-CM

## 2017-12-15 DIAGNOSIS — G8929 Other chronic pain: Secondary | ICD-10-CM

## 2017-12-18 ENCOUNTER — Other Ambulatory Visit: Payer: Self-pay | Admitting: Family Medicine

## 2017-12-18 DIAGNOSIS — M25511 Pain in right shoulder: Secondary | ICD-10-CM | POA: Diagnosis not present

## 2017-12-28 ENCOUNTER — Encounter: Payer: Self-pay | Admitting: Family Medicine

## 2017-12-28 ENCOUNTER — Other Ambulatory Visit: Payer: Self-pay | Admitting: Family Medicine

## 2017-12-28 ENCOUNTER — Telehealth: Payer: Self-pay | Admitting: *Deleted

## 2017-12-28 DIAGNOSIS — E1165 Type 2 diabetes mellitus with hyperglycemia: Secondary | ICD-10-CM

## 2017-12-28 NOTE — Telephone Encounter (Signed)
LM for patient to call back.  Please assist her in making an appointment to follow up with PCP for diabetes.  Ok to book in one of his same day slots due to him being inpatient next month and no more regular slots open.  Astaria Nanez,CMA

## 2017-12-28 NOTE — Telephone Encounter (Signed)
-----   Message from Marjie Skiff, MD sent at 12/28/2017  9:23 AM EDT ----- Irving Copas,  I placed an A1c order for her, can you ask her if she has some time to come for a lab visit and make an appointment to see me sometime next month.  Thanks  Marjie Skiff, MD [place], PGY-2

## 2018-01-08 ENCOUNTER — Other Ambulatory Visit: Payer: Self-pay | Admitting: Cardiovascular Disease

## 2018-01-08 ENCOUNTER — Other Ambulatory Visit: Payer: Self-pay | Admitting: Family Medicine

## 2018-01-08 DIAGNOSIS — I5030 Unspecified diastolic (congestive) heart failure: Secondary | ICD-10-CM

## 2018-01-08 DIAGNOSIS — I10 Essential (primary) hypertension: Secondary | ICD-10-CM

## 2018-01-08 NOTE — Telephone Encounter (Signed)
Patient scheduled for 01-10-18 with PCP.  Mercedes Barr,CMA

## 2018-01-10 ENCOUNTER — Ambulatory Visit (INDEPENDENT_AMBULATORY_CARE_PROVIDER_SITE_OTHER): Payer: Medicare Other | Admitting: Family Medicine

## 2018-01-10 ENCOUNTER — Other Ambulatory Visit: Payer: Self-pay

## 2018-01-10 ENCOUNTER — Other Ambulatory Visit: Payer: Self-pay | Admitting: Family Medicine

## 2018-01-10 ENCOUNTER — Encounter: Payer: Self-pay | Admitting: Family Medicine

## 2018-01-10 VITALS — BP 112/60 | HR 85 | Temp 98.1°F | Ht 62.0 in | Wt 134.0 lb

## 2018-01-10 DIAGNOSIS — E1165 Type 2 diabetes mellitus with hyperglycemia: Secondary | ICD-10-CM | POA: Diagnosis not present

## 2018-01-10 LAB — POCT GLYCOSYLATED HEMOGLOBIN (HGB A1C): HBA1C, POC (CONTROLLED DIABETIC RANGE): 12.3 % — AB (ref 0.0–7.0)

## 2018-01-10 MED ORDER — INSULIN GLARGINE 100 UNIT/ML SOLOSTAR PEN: 10.0000 [IU] | PEN_INJECTOR | Freq: Every day | SUBCUTANEOUS | 99 refills | Status: DC

## 2018-01-10 NOTE — Progress Notes (Signed)
Subjective:    Patient ID: Mercedes Barr, female    DOB: 12/04/1945, 72 y.o.   MRN: [id number]   CC: Follow up for T2DM management  HPI: Patient is a 72 yo female who presents today to follow up on her diabetes. Patient reports that her fasting blood glucose continue to be elevated and are in the mid 200's. Patient has missed her diabetic nutrition appointment but still has questions on how to improve her diet. Patient has completed a course of steroids for her shoulder pain recently. She recently had an eye exam which did not show any retinopathy.  Patient denies any polyuria and polydipsia.  Patient reports that she has been adherent to her medication which consist of metformin, Januvia, and glipizide.   Smoking status reviewed   ROS: all other systems were reviewed and are negative other than in the HPI   Past Medical History:  Diagnosis Date  . 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 Asthma   . Complete heart block ([place])   . COPD (chronic obstructive pulmonary disease) ([place])   . Coronary artery disease   . Diabetes mellitus   . GERD (gastroesophageal reflux disease)   . Heart murmur   . Hypertension   . Hypothyroidism   . Normal coronary arteries 2011  . Pacemaker    PACEMAKER DEPENDENT-DR. [place].  OFFICE NOTE DR. A. LITTLE STATES  "EXTREMELY PACEMAKER SENSITIVE AND WHEN YOU TRY TO CHECK FOR UNDERLYING RHYTHMS SHE WILL HAVE SNYCOPE AND WE HAVE NOT DONE THIS NOW IN ABOUT 2 YEARS".  . Shortness of breath dyspnea    walking distance or climbing stairs    Past Surgical History:  Procedure Laterality Date  . ATRIOVENTRICULAR CUSHION DEFECT REPAIR  1988  . CARDIAC CATHETERIZATION  2011    SHOWED NO CAD-PER CARDIOLOGY OFFICE NOTES DR. A. LITTLE  . colonscopy      removed polyps   . INSERT / REPLACE / REMOVE PACEMAKER    . PACEMAKER INSERTION  1988   last gen 11/08- MDT  . PPM GENERATOR CHANGEOUT N/A 02/15/2017   Procedure: PPM Generator  Changeout;  Surgeon: Sanda Klein, MD;  Location: [facility];  Service: Cardiovascular;  Laterality: N/A;  . TOTAL KNEE ARTHROPLASTY Right 11/19/2012   Procedure: RIGHT TOTAL KNEE ARTHROPLASTY;  Surgeon: Gearlean Alf, MD;  [facility]: [facility];  Service: Orthopedics;  Laterality: Right;  . TOTAL KNEE REVISION Right 09/24/2014   Procedure: RIGHT TOTAL KNEE ARTHROPLASTY REVISION;  Surgeon: Gearlean Alf, MD;  Location: [facility];  Service: Orthopedics;  Laterality: Right;  . TUBAL LIGATION      Past medical history, surgical, family, and social history reviewed and updated in the EMR as appropriate.  Objective:  BP 112/60   Pulse 85   Temp 98.1 F (36.7 C) (Oral)   Ht 5\' 2"  (1.575 m)   Wt 134 lb (60.8 kg)   SpO2 98%   BMI 24.51 kg/m   Vitals and nursing note reviewed  General: NAD, pleasant, able to participate in exam Cardiac: RRR, normal heart sounds, no murmurs. 2+ radial and PT pulses bilaterally Respiratory: CTAB, normal effort, No wheezes, rales or rhonchi Abdomen: soft, nontender, nondistended, no hepatic or splenomegaly, +BS Extremities: no edema or cyanosis. WWP. Skin: warm and dry, no rashes noted Neuro: alert and oriented x4, no focal deficits Psych: Normal affect and mood   Assessment & Plan:    DM (diabetes mellitus), type 2  Patient A1c today is 12.3 slightly down from 13.7.  Patient is currently on metformin, Januvia, and glipizide.  Patient's A1c was around 8.5 for the past year.  Recent increase in A1c likely secondary to dietary indiscretion.  Patient recently had a course of steroids for shoulder pain which could have also influenced today's reading.  Patient needs tighter glycemic control for A1c goal in the mid 7's.  We will start her on injectable.  No evidence of diabetic retinopathy based on recent results. --Start patient on Lantus 10 units daily, increase by 1 unit for the next week.  Measure blood glucose in the morning before breakfast and 2 hours  after lunch and dinner.  Patient will communicate results to PCP for further adjustment.  Patient may require fast acting insulin in the future. --Continue with Metformin, Januvia but will discontinue glipizide.    Marjie Skiff, MD [place] PGY-2

## 2018-01-10 NOTE — Assessment & Plan Note (Addendum)
Patient A1c today is 12.3 slightly down from 13.7.  Patient is currently on metformin, Januvia, and glipizide.  Patient's A1c was around 8.5 for the past year.  Recent increase in A1c likely secondary to dietary indiscretion.  Patient recently had a course of steroids for shoulder pain which could have also influenced today's reading.  Patient needs tighter glycemic control for A1c goal in the mid 7's.  We will start her on injectable.  No evidence of diabetic retinopathy based on recent results. --Start patient on Lantus 10 units daily, increase by 1 unit for the next week.  Measure blood glucose in the morning before breakfast and 2 hours after lunch and dinner.  Patient will communicate results to PCP for further adjustment.  Patient may require fast acting insulin in the future. --Continue with Metformin, Januvia but will discontinue glipizide.

## 2018-01-10 NOTE — Patient Instructions (Addendum)
  Diet Recommendations for Diabetes   Starchy (carb) foods: Bread, rice, pasta, potatoes, corn, cereal, grits, crackers, bagels, muffins, all baked goods.  (Fruits, milk, and yogurt also have carbohydrate, but most of these foods will not spike your blood sugar as the starchy foods will.)  A few fruits do cause high blood sugars; use small portions of bananas (limit to 1/2 at a time), grapes, watermelon, oranges, and most tropical fruits.    Protein foods: Meat, fish, poultry, eggs, dairy foods, and beans such as pinto and kidney beans (beans also provide carbohydrate).   1. Eat at least 3 meals and 1-2 snacks per day. Never go more than 4-5 hours while awake without eating. Eat breakfast within the first hour of getting up.   2. Limit starchy foods to TWO per meal and ONE per snack. ONE portion of a starchy  food is equal to the following:   - ONE slice of bread (or its equivalent, such as half of a hamburger bun).   - 1/2 cup of a "scoopable" starchy food such as potatoes or rice.   - 15 grams of carbohydrate as shown on food label.  3. Include at every meal: a protein food, a carb food, and vegetables and/or fruit.   - Obtain twice the volume of veg's as protein or carbohydrate foods for both lunch and dinner.   - Fresh or frozen veg's are best.   - Keep frozen veg's on hand for a quick vegetable serving.      

## 2018-01-12 ENCOUNTER — Telehealth: Payer: Self-pay

## 2018-01-12 NOTE — Telephone Encounter (Signed)
In addition to caps for insulin pens, patient also needs rx for pen needles. She stated she wanted 1/2 cc short needles.  Call back is [phone]  Danley Danker, RN [facility])

## 2018-01-12 NOTE — Telephone Encounter (Signed)
Has gotten insulin but needs caps that screw onto pens for needles. Please send Rx to [facility] on [address]. Ottis Stain, CMA

## 2018-01-15 ENCOUNTER — Other Ambulatory Visit: Payer: Self-pay | Admitting: Family Medicine

## 2018-01-15 DIAGNOSIS — M25511 Pain in right shoulder: Secondary | ICD-10-CM | POA: Diagnosis not present

## 2018-01-15 NOTE — Telephone Encounter (Signed)
3rd request Kida Digiulio Dawn, CMA  

## 2018-01-16 ENCOUNTER — Other Ambulatory Visit: Payer: Self-pay | Admitting: Family Medicine

## 2018-01-16 MED ORDER — INSULIN PEN NEEDLE 31G X 8 MM MISC: 10.0000 [IU] | Freq: Every day | 1 refills | Status: DC

## 2018-01-16 NOTE — Telephone Encounter (Signed)
Jazmin,  Insulin pen needles sent to [facility], I am not sure what she mean by cap for her lantus. Her pens should come with a cap. I am not sure if we can order that in epic.  Thanks  Marjie Skiff, MD [place], PGY-2

## 2018-01-16 NOTE — Progress Notes (Signed)
1/2 cc short

## 2018-01-18 ENCOUNTER — Telehealth: Payer: Self-pay

## 2018-01-18 NOTE — Telephone Encounter (Signed)
Patient left message to request a Freestyle meter now that she is on insulin. States she has spoken to PCP about this in the past.  Call back is [phone]  Danley Danker, RN [facility] RN)

## 2018-01-19 NOTE — Telephone Encounter (Signed)
The patient means the meter that stays on your arm for continuous glucose monitoring so she doesn't have to stick her fingers. I'm not sure which is correct in Epic but it may be the one I attached.  Danley Danker, RN [facility] RN)

## 2018-01-22 ENCOUNTER — Other Ambulatory Visit: Payer: Self-pay | Admitting: *Deleted

## 2018-01-22 MED ORDER — GLUCOSE BLOOD VI STRP: ORAL_STRIP | 12 refills | Status: DC

## 2018-01-23 ENCOUNTER — Other Ambulatory Visit: Payer: Self-pay

## 2018-01-23 ENCOUNTER — Ambulatory Visit: Payer: Medicare Other | Attending: Orthopedic Surgery

## 2018-01-23 DIAGNOSIS — G8929 Other chronic pain: Secondary | ICD-10-CM | POA: Diagnosis not present

## 2018-01-23 DIAGNOSIS — M25511 Pain in right shoulder: Secondary | ICD-10-CM | POA: Insufficient documentation

## 2018-01-23 DIAGNOSIS — M25611 Stiffness of right shoulder, not elsewhere classified: Secondary | ICD-10-CM | POA: Diagnosis not present

## 2018-01-23 NOTE — Patient Instructions (Signed)
From cabinet  Scapula retraction and LT neck side bend x 2-3 reps 10x/day and to sit with RT arm on pillow abducted to comfort.

## 2018-01-23 NOTE — Therapy (Signed)
[place], [place], [address] Phone: [phone]   Fax:  [phone]  Physical Therapy Evaluation  Patient Details  Name: Mercedes Barr MRN: [id number] Date of Birth: January 08, 1946 Referring Provider: Marchia Bond, MD   Encounter Date: 01/23/2018  PT End of Session - 01/23/18 0925    Visit Number  1    Number of Visits  16    Date for PT Re-Evaluation  03/16/18    Authorization Type  [employer] MCR    Authorization Time Period  KX at visit 15 ...... POC at visit 10    PT Start Time  0925    PT Stop Time  1010    PT Time Calculation (min)  45 min    Activity Tolerance  Patient tolerated treatment well;Patient limited by pain    Behavior During Therapy  [facility] for tasks assessed/performed       Past Medical History:  Diagnosis Date  . 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 Asthma   . Complete heart block ([place])   . COPD (chronic obstructive pulmonary disease) ([employer])   . Coronary artery disease   . Diabetes mellitus   . GERD (gastroesophageal reflux disease)   . Heart murmur   . Hypertension   . Hypothyroidism   . Normal coronary arteries 2011  . Pacemaker    PACEMAKER DEPENDENT-DR. Wahak Hotrontk.  OFFICE NOTE DR. A. LITTLE STATES  "EXTREMELY PACEMAKER SENSITIVE AND WHEN YOU TRY TO CHECK FOR UNDERLYING RHYTHMS SHE WILL HAVE SNYCOPE AND WE HAVE NOT DONE THIS NOW IN ABOUT 2 YEARS".  . Shortness of breath dyspnea    walking distance or climbing stairs    Past Surgical History:  Procedure Laterality Date  . ATRIOVENTRICULAR CUSHION DEFECT REPAIR  1988  . CARDIAC CATHETERIZATION  2011    SHOWED NO CAD-PER CARDIOLOGY OFFICE NOTES DR. A. LITTLE  . colonscopy      removed polyps   . INSERT / REPLACE / REMOVE PACEMAKER    . PACEMAKER INSERTION  1988   last gen 11/08- MDT  . PPM GENERATOR CHANGEOUT N/A 02/15/2017   Procedure: PPM Generator Changeout;  Surgeon: Sanda Klein, MD;   Location: [facility];  Service: Cardiovascular;  Laterality: N/A;  . TOTAL KNEE ARTHROPLASTY Right 11/19/2012   Procedure: RIGHT TOTAL KNEE ARTHROPLASTY;  Surgeon: Gearlean Alf, MD;  Location: [facility];  Service: Orthopedics;  Laterality: Right;  . TOTAL KNEE REVISION Right 09/24/2014   Procedure: RIGHT TOTAL KNEE ARTHROPLASTY REVISION;  Surgeon: Gearlean Alf, MD;  Location: [facility];  Service: Orthopedics;  Laterality: Right;  . TUBAL LIGATION      There were no vitals filed for this visit.   Subjective Assessment - 01/23/18 0929    Subjective  She report RT shoulder problems for 6 months  maybe longer.   MD said OA.  Injections without benefit and steroids without benefit.     Pertinent History  PACEMAKER    Limitations  Lifting dressing.  decr sleep    Diagnostic tests  OA    Patient Stated Goals  She wants to be able to lift arm and use arm like before.     Currently in Pain?  Yes    Pain Score  7     Pain Location  Shoulder    Pain Orientation  Right    Pain Descriptors / Indicators  Throbbing    Pain Type  Chronic pain    Pain Onset  More than a month ago    Pain Frequency  Constant    Aggravating Factors   using arm    Pain Relieving Factors  tyleno,  cold,          [facility] PT Assessment - 01/23/18 0001      Assessment   Medical Diagnosis  rt SHOULDER ADHESIVVE CAPSULITIS    Referring Provider  Marchia Bond, MD    Onset Date/Surgical Date  -- 6 months ago    Hand Dominance  Right    Next MD Visit  July 2019    Prior Therapy  no      Precautions   Precautions  None      Restrictions   Weight Bearing Restrictions  No      Balance Screen   Has the patient fallen in the past 6 months  No    Has the patient had a decrease in activity level because of a fear of falling?   No    Is the patient reluctant to leave their home because of a fear of falling?   No      Cognition   Overall Cognitive Status  Within Functional Limits for tasks assessed       Observation/Other Assessments   Focus on Therapeutic Outcomes (FOTO)   58% limited      ROM / Strength   AROM / PROM / Strength  AROM;PROM;Strength      AROM   Overall AROM Comments  neck LT side bend 10 degrees les than RT sidebend ,      AROM Assessment Site  Shoulder    Right/Left Shoulder  Right;Left    Right Shoulder Extension  45 Degrees    Right Shoulder Flexion  95 Degrees    Right Shoulder ABduction  70 Degrees    Right Shoulder Internal Rotation  -10 Degrees 80 deg abd    Right Shoulder External Rotation  50 Degrees    Right Shoulder Horizontal ABduction  5 Degrees    Right Shoulder Horizontal  ADduction  75 Degrees    Left Shoulder Extension  53 Degrees    Left Shoulder Flexion  142 Degrees    Left Shoulder ABduction  143 Degrees    Left Shoulder Internal Rotation  17 Degrees    Left Shoulder External Rotation  80 Degrees    Left Shoulder Horizontal ABduction  5 Degrees    Left Shoulder Horizontal ADduction  100 Degrees      PROM   PROM Assessment Site  Shoulder    Right/Left Shoulder  Right    Right Shoulder Flexion  100 Degrees    Right Shoulder ABduction  95 Degrees    Right Shoulder Internal Rotation  10 Degrees    Right Shoulder External Rotation  65 Degrees    Right Shoulder Horizontal ABduction  10 Degrees      Strength   Overall Strength Comments  She appeared to have normal strength both shoulder but pain limited flex abduction and int rotation resistance                Objective measurements completed on examination: See above findings.              PT Education - 01/23/18 1000    Education Details  POc HEP    Person(s) Educated  Patient    Methods  Explanation;Demonstration;Verbal cues;Handout    Comprehension  Verbalized understanding  PT Short Term Goals - 01/23/18 1010      PT SHORT TERM GOAL #1   Title  She will be independent with initial HEP    Time  4    Period  Weeks    Status  New      PT SHORT TERM GOAL  #2   Title  She will improve activ RTR shoulder flexion to 120 degrees     Time  4    Period  Weeks    Status  New      PT SHORT TERM GOAL #3   Title  she will improve RT shoulder ER to 74 degrees    Time  4    Period  Weeks    Status  New      PT SHORT TERM GOAL #4   Title  She will report able to reach upper cabinets with 30% less pain and greater ease    Time  4    Period  Weeks    Status  New        PT Long Term Goals - 01/23/18 1011      PT LONG TERM GOAL #1   Title  She will be independnet with all hEP issued    Time  8    Period  Weeks    Status  New      PT LONG TERM GOAL #2   Title  She will be able to reach upper cabinets and place 5 pound swith less than3/10 pain    Time  8    Period  Weeks    Status  New      PT LONG TERM GOAL #3   Title  She will be able to carry 15 pounds in RT arm without incr pain    Time  8    Period  Weeks    Status  New      PT LONG TERM GOAL #4   Title  She will report able to sleep without waking due to pain    Time  8    Period  Weeks    Status  New      PT LONG TERM GOAL #5   Title  FOTO score improved to 48% limited to demo functinal improvement    Time  8    Period  Weeks    Status  New             Plan - 01/23/18 0926    Clinical Impression Statement  Ms Pecore reports chronic RT shoulder pain  and stiffness.  She is limited in use of RT arm due to this. She is stiff in LT shoulder also. no pain LT.   After some STW she was able to lift higher so she appears to be able to make improvements with PT   Clinical Presentation  Stable    Clinical Decision Making  Low    Rehab Potential  Good    PT Frequency  2x / week    PT Duration  8 weeks    PT Treatment/Interventions  Dry needling;Moist Heat;Ultrasound;Iontophoresis 4mg /ml Dexamethasone;Therapeutic exercise;Patient/family education;Manual techniques    PT Next Visit Plan  manual and modalities    Consulted and Agree with Plan of Care  Patient        Patient will benefit from skilled therapeutic intervention in order to improve the following deficits and impairments:  Pain, Impaired UE functional use, Decreased range of motion  Visit Diagnosis: Stiffness  of right shoulder, not elsewhere classified  Chronic right shoulder pain     Problem List Patient Active Problem List   Diagnosis Date Noted  . Rotator cuff tendinitis, right 09/20/2017  . Persistent atrial fibrillation ([place]) 08/17/2017  . Pacemaker battery depletion 02/15/2017  . Right knee pain 07/14/2015  . Tremor of left hand 06/12/2015  . (HFpEF) heart failure with preserved ejection fraction ([place]) 05/21/2015  . Difficulty walking 04/27/2015  . Right hip pain 04/08/2015  . Left shoulder pain 12/01/2014  . Osteoarthritis, multiple sites 11/20/2014  . Failed total right knee replacement ([place]) 09/24/2014  . Itching 09/10/2014  . Shortness of breath 10/29/2013  . Health care maintenance 06/26/2013  . Complete heart block ([place])   . Normal coronary arteries-Feb 2011   . Pacemaker   . Weight loss, unintentional 12/21/2012  . Osteoarthritis of right knee 08/22/2012  . Insomnia 03/08/2012  . Allergy to bee sting 10/04/2011  . Low back pain radiating to both legs 09/07/2011  . Hypothyroidism 12/28/2009  . GLAUCOMA 12/25/2009  . GERD 01/19/2009  . COPD (chronic obstructive pulmonary disease) ([place]) 11/06/2008  . Abnormal involuntary movement 09/03/2008  . Essential hypertension 06/14/2007  . DM (diabetes mellitus), type 2 ([place]) 11/06/2006  . DEPRESSION 11/06/2006  . Hyperlipidemia 11/03/2006    Darrel Hoover  PT 01/23/2018, 10:14 AM  [facility] [address] [place], [place], [address] Phone: [phone]   Fax:  [phone]  Name: Mercedes Barr MRN: [id number] Date of Birth: Mar 31, 1946

## 2018-01-26 NOTE — Telephone Encounter (Signed)
For now she should continue using her current glucometer, will discuss continous glucose monitoring at the next office visit.   Thank you  Marjie Skiff, MD [place], PGY-2

## 2018-01-26 NOTE — Telephone Encounter (Signed)
Spoke with patient and she has an appointment to see the diabetes center on 02-05-18 and will ask about her testing options at the appt.  Jazmin Hartsell,CMA

## 2018-02-05 ENCOUNTER — Encounter: Payer: Self-pay | Admitting: Registered"

## 2018-02-05 ENCOUNTER — Telehealth: Payer: Self-pay

## 2018-02-05 ENCOUNTER — Encounter: Payer: Medicare Other | Attending: Family Medicine | Admitting: Registered"

## 2018-02-05 ENCOUNTER — Other Ambulatory Visit: Payer: Self-pay | Admitting: Family Medicine

## 2018-02-05 DIAGNOSIS — Z713 Dietary counseling and surveillance: Secondary | ICD-10-CM | POA: Diagnosis not present

## 2018-02-05 DIAGNOSIS — E1165 Type 2 diabetes mellitus with hyperglycemia: Secondary | ICD-10-CM | POA: Diagnosis not present

## 2018-02-05 NOTE — Progress Notes (Unsigned)
oe

## 2018-02-05 NOTE — Telephone Encounter (Signed)
I think one touch meter still requires finger stick. Will talk to Dr. Valentina Lucks when I am in clinic and will get back to her. Please let patient know.  Thank you    Marjie Skiff, MD [place], PGY-3

## 2018-02-05 NOTE — Patient Instructions (Addendum)
   Consider taking a Vitamin B12  Consider talking to doctor at next visit about nausea if you are still experiencing it, it may be due to your high blood sugar.  Talk to your doctor about if you need the Crestor, refer to the Cholesterol handout.  Consider having more protein, less carbs at breakfast. It is okay to eat the egg yolk.  Remember to have protein when have fruit. Good fruits to help with constipation are apples, pears, and figs.  Reflux symptoms may be exacerbated by fried foods, consider switching the fried chicken for baked or grilled.  Consider getting fish in your diet 1 or 2 times more per week.  Consider getting 15 min of exercise per day.  Consider the sleep tips to get more sleep. Aim for 7-8 hrs.

## 2018-02-05 NOTE — Progress Notes (Signed)
Diabetes Self-Management Education  Visit Type: First/Initial  Appt. Start Time: 0750 Appt. End Time: 0920  02/05/2018  Mercedes Barr, identified by name and date of birth, is a 72 y.o. female with a diagnosis of Diabetes: Type 2.   ASSESSMENT  Height _0  (1.575 m), weight 132 lb 14.4 oz (60.3 kg). Body mass index is 24.31 kg/m.  Pt ambulates slowly with cane. Pt used [employer] transportation to appointment.  Per chart A1c 12.3% last year was 8.5%. PCP notes include goal for patient is "mid 7's" RD provided patient with A1c handout and instructed how to check blood sugar to determine progress with that goal in mind. From dietary recall RD could not identify any substantial carb intake that would explain the big jump in A1c. Pt states she has increased her Lantus to 20 units and plans to call doctor to see if she should continue increasing it. Pt reports FBG consistently ~260 mg/dL. Random BG 300-400 mg/dL. Per MD notes patient instructed to continue Januvia and only stop glipizide, but patient states she was told to stop both and is just taking 20 units Lantus, 2,000 mg metformin. RD notified MD through Epic of this potential misunderstanding of instructions.  Pt states she wants to know what she should be eating/not eating. Pt states she started reading how to eat for T2DM ~1 year ago and has been reading labels and paying attention to sugar and carbs and does not drink any sweetened beverages. Pt states she enjoys vegetables and eats them daily, beans 1-2x week, fish on Fridays. Pt states she also avoids salty food.   Pt states her physical activity is limited to walking in house every other day. Pt reports she was going to gym, but often she feels nauseated doesn't even want to get out of bed. RD believes nausea may partially due to high blood sugar, gets into 400s. Pt states she has not talked to doctor about nausea. Pt reports her appetite comes and goes, early satiety.  Pt sates she takes  Fish oil 1000 mg. RD educated patient the potential benefit of taking 2000 for TG, increasing HDL and benefit of reducing inflammation. Pt is also on blood thinner, and RD defers to her MD whether or not to recommend increasing dose.   Pt states all the medications she is taking contribute to constipation but taking miralax every other day and resolves issue.  Pt states she would like more energy, reports level 5 out 10. Pt states her sleep is "not good" and gets less than 6 hours, wakes up and mind keeps her awake.  Other relevant PMH: Afib, Osteoarthritis, rotator cuff tendinitis, hypothyroidism GERD, COPD, insomnia. Per chart many medication allergies - itching, rash  Diabetes Self-Management Education - 02/05/18 0808      Visit Information   Visit Type  First/Initial      Initial Visit   Diabetes Type  Type 2    Are you currently following a meal plan?  No    Are you taking your medications as prescribed?  Yes    Date Diagnosed  2002      Health Coping   How would you rate your overall health?  Fair      Psychosocial Assessment   Patient Belief/Attitude about Diabetes  Afraid    How often do you need to have someone help you when you read instructions, pamphlets, or other written materials from your doctor or pharmacy?  1 - Never    What is  the last grade level you completed in school?  12      Complications   Last HgB A1C per patient/outside source  12.3 %    How often do you check your blood sugar?  3-4 times/day    Fasting Blood glucose range (mg/dL)  >200 260 was 300 something before lantus    Number of hypoglycemic episodes per month  0    Number of hyperglycemic episodes per week  -- always above 260    Have you had a dilated eye exam in the past 12 months?  Yes    Have you had a dental exam in the past 12 months?  Yes    Are you checking your feet?  Yes    How many days per week are you checking your feet?  7      Dietary Intake   Breakfast  oatmeal sometimes toast  OR 2 boiled eggs no yolk    Snack (morning)  applesauce or yogurt    Lunch  (skips sometimes) boiled chicken, green beans, stays away from potatoes    Snack (afternoon)  strawberries, blueberries, apples OR Glucerna    Dinner  ("over do it") Mac & cheese, roll, fried chicken OR garden salad (no protein), croutons    Snack (evening)  popcorn     Beverage(s)  vitamin water zero, water, ginger tea w less than tsp honey      Exercise   Exercise Type  Light (walking / raking leaves)    How many days per week to you exercise?  4    How many minutes per day do you exercise?  15    Total minutes per week of exercise  60      Patient Education   Previous Diabetes Education  No    Nutrition management   Role of diet in the treatment of diabetes and the relationship between the three main macronutrients and blood glucose level    Physical activity and exercise   Role of exercise on diabetes management, blood pressure control and cardiac health.    Medications  Reviewed patients medication for diabetes, action, purpose, timing of dose and side effects.    Monitoring  Identified appropriate SMBG and/or A1C goals.;Purpose and frequency of SMBG.    Chronic complications  Lipid levels, blood glucose control and heart disease    Psychosocial adjustment  Other (comment) sleep      Individualized Goals (developed by patient)   Nutrition  General guidelines for healthy choices and portions discussed    Physical Activity  15 minutes per day    Monitoring   test my blood glucose as discussed      Outcomes   Expected Outcomes  Demonstrated interest in learning. Expect positive outcomes    Future DMSE  4-6 wks    Program Status  Not Completed     Individualized Plan for Diabetes Self-Management Training:   Learning Objective:  Patient will have a greater understanding of diabetes self-management. Patient education plan is to attend individual and/or group sessions per assessed needs and  concerns.  Patient Instructions   Consider taking a Vitamin B12  Consider talking to doctor at next visit about nausea if you are still experiencing it, it may be due to your high blood sugar.  Talk to your doctor about if you need the Crestor, refer to the Cholesterol handout.  Consider having more protein, less carbs at breakfast. It is okay to eat the egg yolk.  Remember to  have protein when have fruit. Good fruits to help with constipation are apples, pears, and figs.  Reflux symptoms may be exacerbated by fried foods, consider switching the fried chicken for baked or grilled.  Consider getting fish in your diet 1 or 2 times more per week.  Consider getting 15 min of exercise per day.  Consider the sleep tips to get more sleep. Aim for 7-8 hrs.   Expected Outcomes:  Demonstrated interest in learning. Expect positive outcomes  Education material provided: ADA Diabetes: Your Take Control Guide, A1C conversion sheet, Snack sheet and Carbohydrate counting sheet, sleep hygiene, Cholesterol & Triglycerides, No Sodium Seasoning Ideas  If problems or questions, patient to contact team via:  Phone  Future DSME appointment: 4-6 wks

## 2018-02-05 NOTE — Telephone Encounter (Signed)
Pt called nurse line requesting "one touch meter," to be sent into her pharmacy. Pt stated she saw a nutrionist today and they suggested this. The one touch meter allows pt to check her sugars without stick? Please advise. [facility] [address]

## 2018-02-06 ENCOUNTER — Ambulatory Visit: Payer: Medicare Other | Attending: Orthopedic Surgery

## 2018-02-06 DIAGNOSIS — M25511 Pain in right shoulder: Secondary | ICD-10-CM | POA: Insufficient documentation

## 2018-02-06 DIAGNOSIS — M25611 Stiffness of right shoulder, not elsewhere classified: Secondary | ICD-10-CM | POA: Insufficient documentation

## 2018-02-06 DIAGNOSIS — G8929 Other chronic pain: Secondary | ICD-10-CM

## 2018-02-06 NOTE — Therapy (Signed)
[place] [facility], [place], [address] Phone: [phone]   Fax:  [phone]  Physical Therapy Treatment  Patient Details  Name: Mercedes Barr MRN: [id number] Date of Birth: May 29, 1946 Referring Provider: Marchia Bond, MD   Encounter Date: 02/06/2018  PT End of Session - 02/06/18 0942    Visit Number  2    Number of Visits  16    Date for PT Re-Evaluation  03/16/18    Authorization Type  UHC MCR    Authorization Time Period  KX at visit 15 ...... POC at visit 10    PT Start Time  [phone]    PT Stop Time  1030    PT Time Calculation (min)  52 min    Activity Tolerance  Patient tolerated treatment well;Patient limited by pain    Behavior During Therapy  [facility] for tasks assessed/performed       Past Medical History:  Diagnosis Date  . 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 Asthma   . Complete heart block ([place])   . COPD (chronic obstructive pulmonary disease) ([place])   . Coronary artery disease   . Diabetes mellitus   . GERD (gastroesophageal reflux disease)   . Heart murmur   . Hypertension   . Hypothyroidism   . Normal coronary arteries 2011  . Pacemaker    PACEMAKER DEPENDENT-DR. [place].  OFFICE NOTE DR. A. LITTLE STATES  "EXTREMELY PACEMAKER SENSITIVE AND WHEN YOU TRY TO CHECK FOR UNDERLYING RHYTHMS SHE WILL HAVE SNYCOPE AND WE HAVE NOT DONE THIS NOW IN ABOUT 2 YEARS".  . Shortness of breath dyspnea    walking distance or climbing stairs    Past Surgical History:  Procedure Laterality Date  . ATRIOVENTRICULAR CUSHION DEFECT REPAIR  1988  . CARDIAC CATHETERIZATION  2011    SHOWED NO CAD-PER CARDIOLOGY OFFICE NOTES DR. A. LITTLE  . colonscopy      removed polyps   . INSERT / REPLACE / REMOVE PACEMAKER    . PACEMAKER INSERTION  1988   last gen 11/08- MDT  . PPM GENERATOR CHANGEOUT N/A 02/15/2017   Procedure: PPM Generator Changeout;  Surgeon: Sanda [place], MD;  Location:  [place] [facility];  Service: Cardiovascular;  Laterality: N/A;  . TOTAL KNEE ARTHROPLASTY Right 11/19/2012   Procedure: RIGHT TOTAL KNEE ARTHROPLASTY;  Surgeon: Gearlean Alf, MD;  [facility];  Service: Orthopedics;  Laterality: Right;  . TOTAL KNEE REVISION Right 09/24/2014   Procedure: RIGHT TOTAL KNEE ARTHROPLASTY REVISION;  Surgeon: Gearlean Alf, MD;  [facility];  Service: Orthopedics;  Laterality: Right;  . TUBAL LIGATION      There were no vitals filed for this visit.  Subjective Assessment - 02/06/18 0941    Subjective  Shoulder pain 7/10 at rest.     Pain Score  7     Pain Location  Shoulder    Pain Orientation  Right    Pain Descriptors / Indicators  Throbbing    Pain Type  Chronic pain    Pain Onset  More than a month ago    Pain Frequency  Constant    Aggravating Factors   using  arm                        [facility] Adult PT Treatment/Exercise - 02/06/18 0001      Shoulder Exercises: Seated   Other Seated Exercises  Scapula ROM active elevation /depression,  protraction /retraction.  x 12-15 reps      Modalities   Modalities  Ultrasound;Moist Heat      Moist Heat Therapy   Number Minutes Moist Heat  12 Minutes    Moist Heat Location  Shoulder during and post stretching      Ultrasound   Ultrasound Location  RT shoulder    Ultrasound Parameters  100% 1.5 wc2 1MHz    Ultrasound Goals  Pain      Manual Therapy   Manual Therapy  Joint mobilization;Soft tissue mobilization;Manual Traction;Passive ROM    Soft tissue mobilization  IASTIM rt shoulder and manual to anterior RT shoulder pectorals    Passive ROM  All planes to tolerance 30 second to 45 sec stretching      Manual Traction  RT GH joint               PT Short Term Goals - 01/23/18 1010      PT SHORT TERM GOAL #1   Title  She will be independent with initial HEP    Time  4    Period  Weeks    Status  New      PT SHORT TERM GOAL #2   Title  She will improve activ  RTR shoulder flexion to 120 degrees     Time  4    Period  Weeks    Status  New      PT SHORT TERM GOAL #3   Title  she will improve RT shoulder ER to 74 degrees    Time  4    Period  Weeks    Status  New      PT SHORT TERM GOAL #4   Title  She will report able to reach upper cabinets with 30% less pain and greater ease    Time  4    Period  Weeks    Status  New        PT Long Term Goals - 01/23/18 1011      PT LONG TERM GOAL #1   Title  She will be independnet with all hEP issued    Time  8    Period  Weeks    Status  New      PT LONG TERM GOAL #2   Title  She will be able to reach upper cabinets and place 5 pound swith less than3/10 pain    Time  8    Period  Weeks    Status  New      PT LONG TERM GOAL #3   Title  She will be able to carry 15 pounds in RT arm without incr pain    Time  8    Period  Weeks    Status  New      PT LONG TERM GOAL #4   Title  She will report able to sleep without waking due to pain    Time  8    Period  Weeks    Status  New      PT LONG TERM GOAL #5   Title  FOTO score improved to 48% limited to demo functinal improvement    Time  8    Period  Weeks    Status  New            Plan - 02/06/18 1610    Clinical Impression Statement  No incr pain post . Progressed gently to  start  and over next few sessions will incr stretching tension as tolerated.     PT Treatment/Interventions  Dry needling;Moist Heat;Ultrasound;Iontophoresis 4mg /ml Dexamethasone;Therapeutic exercise;Patient/family education;Manual techniques    PT Next Visit Plan  manual and modalities    PT Home Exercise Plan  scpula retraction /elevation and depression    Consulted and Agree with Plan of Care  Patient       Patient will benefit from skilled therapeutic intervention in order to improve the following deficits and impairments:  Pain, Impaired UE functional use, Decreased range of motion  Visit Diagnosis: Stiffness of right shoulder, not elsewhere  classified  Chronic right shoulder pain     Problem List Patient Active Problem List   Diagnosis Date Noted  . Uncontrolled type 2 diabetes mellitus with hyperglycemia ([place]) 02/05/2018  . Rotator cuff tendinitis, right 09/20/2017  . Persistent atrial fibrillation ([place]) 08/17/2017  . Pacemaker battery depletion 02/15/2017  . Right knee pain 07/14/2015  . Tremor of left hand 06/12/2015  . (HFpEF) heart failure with preserved ejection fraction ([place]) 05/21/2015  . Difficulty walking 04/27/2015  . Right hip pain 04/08/2015  . Left shoulder pain 12/01/2014  . Osteoarthritis, multiple sites 11/20/2014  . Failed total right knee replacement ([place]) 09/24/2014  . Itching 09/10/2014  . Shortness of breath 10/29/2013  . Health care maintenance 06/26/2013  . Complete heart block ([place])   . Normal coronary arteries-Feb 2011   . Pacemaker   . Weight loss, unintentional 12/21/2012  . Osteoarthritis of right knee 08/22/2012  . Insomnia 03/08/2012  . Allergy to bee sting 10/04/2011  . Low back pain radiating to both legs 09/07/2011  . Hypothyroidism 12/28/2009  . GLAUCOMA 12/25/2009  . GERD 01/19/2009  . COPD (chronic obstructive pulmonary disease) ([place]) 11/06/2008  . Abnormal involuntary movement 09/03/2008  . Essential hypertension 06/14/2007  . DM (diabetes mellitus), type 2 ([place]) 11/06/2006  . DEPRESSION 11/06/2006  . Hyperlipidemia 11/03/2006    Mercedes Barr  PT 02/06/2018, 10:27 AM  [facility] [address] [place], [place], [address] Phone: [phone]   Fax:  [phone]  Name: Mercedes Barr MRN: [id number] Date of Birth: 11-Dec-1945

## 2018-02-09 LAB — CUP PACEART INCLINIC DEVICE CHECK
Date Time Interrogation Session: 20190705145758
Implantable Lead Location: 753860
Implantable Lead Model: 5068
Implantable Lead Model: 5092
Implantable Pulse Generator Implant Date: 20180711
MDC IDC LEAD IMPLANT DT: 19990203
MDC IDC LEAD IMPLANT DT: 19990203
MDC IDC LEAD LOCATION: 753859

## 2018-02-12 ENCOUNTER — Ambulatory Visit: Payer: Medicare Other | Admitting: Physical Therapy

## 2018-02-12 ENCOUNTER — Encounter: Payer: Self-pay | Admitting: Physical Therapy

## 2018-02-12 DIAGNOSIS — M25511 Pain in right shoulder: Secondary | ICD-10-CM | POA: Diagnosis not present

## 2018-02-12 DIAGNOSIS — M25611 Stiffness of right shoulder, not elsewhere classified: Secondary | ICD-10-CM

## 2018-02-12 DIAGNOSIS — G8929 Other chronic pain: Secondary | ICD-10-CM | POA: Diagnosis not present

## 2018-02-12 NOTE — Therapy (Signed)
[place], [place], [address] Phone: [phone]   Fax:  [phone]  Physical Therapy Treatment  Patient Details  Name: Mercedes Barr MRN: [id number] Date of Birth: [birth year] Referring Provider: Marchia Bond, MD   Encounter Date: 02/12/2018  PT End of Session - 02/12/18 0932    Visit Number  3    Number of Visits  16    Date for PT Re-Evaluation  03/16/18    PT Start Time  0846    PT Stop Time  0932    PT Time Calculation (min)  46 min    Activity Tolerance  Patient tolerated treatment well    Behavior During Therapy  [facility] for tasks assessed/performed       Past Medical History:  Diagnosis Date  . 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 Asthma   . Complete heart block ([place])   . COPD (chronic obstructive pulmonary disease) ([place])   . Coronary artery disease   . Diabetes mellitus   . GERD (gastroesophageal reflux disease)   . Heart murmur   . Hypertension   . Hypothyroidism   . Normal coronary arteries 2011  . Pacemaker    PACEMAKER DEPENDENT-DR. [place].  OFFICE NOTE DR. A. LITTLE STATES  "EXTREMELY PACEMAKER SENSITIVE AND WHEN YOU TRY TO CHECK FOR UNDERLYING RHYTHMS SHE WILL HAVE SNYCOPE AND WE HAVE NOT DONE THIS NOW IN ABOUT 2 YEARS".  . Shortness of breath dyspnea    walking distance or climbing stairs    Past Surgical History:  Procedure Laterality Date  . ATRIOVENTRICULAR CUSHION DEFECT REPAIR  1988  . CARDIAC CATHETERIZATION  2011    SHOWED NO CAD-PER CARDIOLOGY OFFICE NOTES DR. A. LITTLE  . colonscopy      removed polyps   . INSERT / REPLACE / REMOVE PACEMAKER    . PACEMAKER INSERTION  1988   last gen 11/08- MDT  . PPM GENERATOR CHANGEOUT N/A 02/15/2017   Procedure: PPM Generator Changeout;  Surgeon: Sanda [place], MD;  Location: [place] [facility];  Service: Cardiovascular;  Laterality: N/A;  . TOTAL KNEE ARTHROPLASTY Right 11/19/2012   Procedure:  RIGHT TOTAL KNEE ARTHROPLASTY;  Surgeon: Gearlean Alf, MD;  [facility];  Service: Orthopedics;  Laterality: Right;  . TOTAL KNEE REVISION Right 09/24/2014   Procedure: RIGHT TOTAL KNEE ARTHROPLASTY REVISION;  Surgeon: Gearlean Alf, MD;  [facility];  Service: Orthopedics;  Laterality: Right;  . TUBAL LIGATION      There were no vitals filed for this visit.  Subjective Assessment - 02/12/18 0852    Subjective  Has been doing the exercises.  They hurt.     Currently in Pain?  Yes    Pain Score  7     Pain Location  Shoulder    Pain Orientation  Right    Pain Descriptors / Indicators  Throbbing    Pain Type  Chronic pain    Pain Frequency  Constant    Aggravating Factors   using arm,  hurts at rest  hurts at night,, can't get comfortable,  exercises,  reaching across body.      Pain Relieving Factors  tylenol,,  cold    Effect of Pain on Daily Activities  cant sleep well.      Multiple Pain Sites  -- Right knee has had 2 knee replacements,, 6/10 throbbing          [facility]  PT Assessment - 02/12/18 0001      AROM   Right Shoulder Flexion  105 Degrees with pain                   [facility] Adult PT Treatment/Exercise - 02/12/18 0001      Moist Heat Therapy   Number Minutes Moist Heat  10 Minutes concurrent with manual joint mobs    Moist Heat Location  Shoulder      Manual Therapy   Manual Therapy  Joint mobilization;Soft tissue mobilization;Manual Traction;Passive ROM    Manual therapy comments  teres tight sotr    Joint Mobilization  grade 2 . 3     Soft tissue mobilization  right shoulder    Passive ROM  shoulder and elbow             PT Education - 02/12/18 0931    Education Details  HEP    Person(s) Educated  Patient    Methods  Explanation;Demonstration;Tactile cues;Verbal cues;Handout    Comprehension  Verbalized understanding;Returned demonstration       PT Short Term Goals - 01/23/18 1010      PT SHORT TERM GOAL #1   Title  She  will be independent with initial HEP    Time  4    Period  Weeks    Status  New      PT SHORT TERM GOAL #2   Title  She will improve activ RTR shoulder flexion to 120 degrees     Time  4    Period  Weeks    Status  New      PT SHORT TERM GOAL #3   Title  she will improve RT shoulder ER to 74 degrees    Time  4    Period  Weeks    Status  New      PT SHORT TERM GOAL #4   Title  She will report able to reach upper cabinets with 30% less pain and greater ease    Time  4    Period  Weeks    Status  New        PT Long Term Goals - 01/23/18 1011      PT LONG TERM GOAL #1   Title  She will be independnet with all hEP issued    Time  8    Period  Weeks    Status  New      PT LONG TERM GOAL #2   Title  She will be able to reach upper cabinets and place 5 pound swith less than3/10 pain    Time  8    Period  Weeks    Status  New      PT LONG TERM GOAL #3   Title  She will be able to carry 15 pounds in RT arm without incr pain    Time  8    Period  Weeks    Status  New      PT LONG TERM GOAL #4   Title  She will report able to sleep without waking due to pain    Time  8    Period  Weeks    Status  New      PT LONG TERM GOAL #5   Title  FOTO score improved to 48% limited to demo functinal improvement    Time  8    Period  Weeks    Status  New  Plan - 02/12/18 0933    Clinical Impression Statement  Pain 7/10 at end of session . AROM flexion 105 prior to session.  She is adherent with HEP.      PT Next Visit Plan  manual and modalities    PT Home Exercise Plan  scpula retraction /elevation and depression,  aterior and posterior capsule sleeper stretch    Consulted and Agree with Plan of Care  Patient       Patient will benefit from skilled therapeutic intervention in order to improve the following deficits and impairments:     Visit Diagnosis: Stiffness of right shoulder, not elsewhere classified  Chronic right shoulder pain     Problem  List Patient Active Problem List   Diagnosis Date Noted  . Uncontrolled type 2 diabetes mellitus with hyperglycemia ([place]) 02/05/2018  . Rotator cuff tendinitis, right 09/20/2017  . Persistent atrial fibrillation ([place]) 08/17/2017  . Pacemaker battery depletion 02/15/2017  . Right knee pain 07/14/2015  . Tremor of left hand 06/12/2015  . (HFpEF) heart failure with preserved ejection fraction ([place]) 05/21/2015  . Difficulty walking 04/27/2015  . Right hip pain 04/08/2015  . Left shoulder pain 12/01/2014  . Osteoarthritis, multiple sites 11/20/2014  . Failed total right knee replacement ([place]) 09/24/2014  . Itching 09/10/2014  . Shortness of breath 10/29/2013  . Health care maintenance 06/26/2013  . Complete heart block ([place])   . Normal coronary arteries-Feb 2011   . Pacemaker   . Weight loss, unintentional 12/21/2012  . Osteoarthritis of right knee 08/22/2012  . Insomnia 03/08/2012  . Allergy to bee sting 10/04/2011  . Low back pain radiating to both legs 09/07/2011  . Hypothyroidism 12/28/2009  . GLAUCOMA 12/25/2009  . GERD 01/19/2009  . COPD (chronic obstructive pulmonary disease) ([place]) 11/06/2008  . Abnormal involuntary movement 09/03/2008  . Essential hypertension 06/14/2007  . DM (diabetes mellitus), type 2 ([place]) 11/06/2006  . DEPRESSION 11/06/2006  . Hyperlipidemia 11/03/2006    Decklin Weddington PTA 02/12/2018, 9:35 AM  [place], [place], [address] Phone: [phone]   Fax:  [phone]  Name: LUCYNDA ROSANO MRN: [id number] Date of Birth: 04-25-1946

## 2018-02-12 NOTE — Patient Instructions (Signed)
Anterior Capsule Sleeper Stretch, Side-Lying    Lie on side, pillow under head, neck in neutral, underside arm in 90-90 of shoulder and elbow flexion with scapula fixed to table. Use other hand to press palm of underside arm forward and downward. Keep elbow angle. Hold _10__ seconds.  Repeat _5 to 10__ times per session. Do __1 to 3 _ sessions per day.   Copyright  VHI. All rights reserved.  Posterior Capsule Sleeper Stretch, Side-Lying    Lie on side, pillow under head, neck in neutral, underside arm in 90-90 of shoulder and elbow flexion with scapula fixed to table. Use other hand to press back of underside arm forward and downward. Keep elbow angle. Hold 5 to 10___ seconds.  Repeat 5 to 10___ times per session. Do _1 to 3__ sessions per day.   Copyright  VHI. All rights reserved.

## 2018-02-14 ENCOUNTER — Ambulatory Visit (INDEPENDENT_AMBULATORY_CARE_PROVIDER_SITE_OTHER): Payer: Medicare Other | Admitting: *Deleted

## 2018-02-14 ENCOUNTER — Ambulatory Visit: Payer: Medicare Other | Admitting: Physical Therapy

## 2018-02-14 DIAGNOSIS — I442 Atrioventricular block, complete: Secondary | ICD-10-CM | POA: Diagnosis not present

## 2018-02-14 DIAGNOSIS — I5032 Chronic diastolic (congestive) heart failure: Secondary | ICD-10-CM

## 2018-02-14 NOTE — Progress Notes (Signed)
Remote pacemaker transmission.   

## 2018-02-14 NOTE — Telephone Encounter (Signed)
Pt calling to check status. Advised that Dr. Valentina Lucks is not in office this week.  She is agreeable to waiting until Dr. Valentina Lucks is in clinic. Keyla Milone, [name], CMA

## 2018-02-19 ENCOUNTER — Ambulatory Visit: Payer: Medicare Other

## 2018-02-19 DIAGNOSIS — M25511 Pain in right shoulder: Secondary | ICD-10-CM | POA: Diagnosis not present

## 2018-02-19 DIAGNOSIS — M25611 Stiffness of right shoulder, not elsewhere classified: Secondary | ICD-10-CM | POA: Diagnosis not present

## 2018-02-19 DIAGNOSIS — G8929 Other chronic pain: Secondary | ICD-10-CM

## 2018-02-19 NOTE — Therapy (Addendum)
[place], [place], [address] Phone: [phone]   Fax:  [phone]  Physical Therapy Treatment/Discharge  Patient Details  Name: Mercedes Barr MRN: [id number] Date of Birth: [birth year] Referring Provider: Marchia Bond, MD   Encounter Date: 02/19/2018  PT End of Session - 02/19/18 0816    Visit Number  4    Number of Visits  16    Date for PT Re-Evaluation  03/16/18    Authorization Type  UHC MCR    Authorization Time Period  KX at visit 15 ...... POC at visit 10    PT Start Time  0830    PT Stop Time  0925    PT Time Calculation (min)  55 min    Activity Tolerance  Patient tolerated treatment well    Behavior During Therapy  [facility] for tasks assessed/performed       Past Medical History:  Diagnosis Date  . 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 Asthma   . Complete heart block ([place])   . COPD (chronic obstructive pulmonary disease) ([place])   . Coronary artery disease   . Diabetes mellitus   . GERD (gastroesophageal reflux disease)   . Heart murmur   . Hypertension   . Hypothyroidism   . Normal coronary arteries 2011  . Pacemaker    PACEMAKER DEPENDENT-DR. [place].  OFFICE NOTE DR. A. LITTLE STATES  "EXTREMELY PACEMAKER SENSITIVE AND WHEN YOU TRY TO CHECK FOR UNDERLYING RHYTHMS SHE WILL HAVE SNYCOPE AND WE HAVE NOT DONE THIS NOW IN ABOUT 2 YEARS".  . Shortness of breath dyspnea    walking distance or climbing stairs    Past Surgical History:  Procedure Laterality Date  . ATRIOVENTRICULAR CUSHION DEFECT REPAIR  1988  . CARDIAC CATHETERIZATION  2011    SHOWED NO CAD-PER CARDIOLOGY OFFICE NOTES DR. A. LITTLE  . colonscopy      removed polyps   . INSERT / REPLACE / REMOVE PACEMAKER    . PACEMAKER INSERTION  1988   last gen 11/08- MDT  . PPM GENERATOR CHANGEOUT N/A 02/15/2017   Procedure: PPM Generator Changeout;  Surgeon: Sanda [place], MD;  Location: [place]  [facility];  Service: Cardiovascular;  Laterality: N/A;  . TOTAL KNEE ARTHROPLASTY Right 11/19/2012   Procedure: RIGHT TOTAL KNEE ARTHROPLASTY;  Surgeon: Gearlean Alf, MD;  [facility];  Service: Orthopedics;  Laterality: Right;  . TOTAL KNEE REVISION Right 09/24/2014   Procedure: RIGHT TOTAL KNEE ARTHROPLASTY REVISION;  Surgeon: Gearlean Alf, MD;  [facility];  Service: Orthopedics;  Laterality: Right;  . TUBAL LIGATION      There were no vitals filed for this visit.      [facility] PT Assessment - 02/19/18 0001      AROM   Right Shoulder Flexion  120 Degrees    Right Shoulder ABduction  100 Degrees    Right Shoulder External Rotation  65 Degrees      PROM   Right Shoulder Flexion  125 Degrees    Right Shoulder ABduction  105 Degrees    Right Shoulder External Rotation  80 Degrees                   [facility] Adult PT Treatment/Exercise - 02/19/18 0001      Shoulder Exercises: Supine   Flexion  AAROM;5 reps;Limitations    Flexion Limitations  self stretch 5x5 reps.  Moist Heat Therapy   Number Minutes Moist Heat  12 Minutes    Moist Heat Location  Shoulder RT      Manual Therapy   Joint Mobilization  grade 2 . 3 GH joint and  ribs /vertebra  thporacic spine     Soft tissue mobilization  right shoulder and periscapular tissue    Passive ROM  shoulder and elbow    Manual Traction  RT GH joint               PT Short Term Goals - 02/19/18 0933      PT SHORT TERM GOAL #1   Title  She will be independent with initial HEP    Status  Achieved      PT SHORT TERM GOAL #2   Title  She will improve activ RTR shoulder flexion to 120 degrees     Status  On-going      PT SHORT TERM GOAL #3   Title  she will improve RT shoulder ER to 74 degrees    Status  Achieved      PT SHORT TERM GOAL #4   Title  She will report able to reach upper cabinets with 30% less pain and greater ease    Status  Unable to assess        PT Long Term Goals - 01/23/18  1011      PT LONG TERM GOAL #1   Title  She will be independnet with all hEP issued    Time  8    Period  Weeks    Status  New      PT LONG TERM GOAL #2   Title  She will be able to reach upper cabinets and place 5 pound swith less than3/10 pain    Time  8    Period  Weeks    Status  New      PT LONG TERM GOAL #3   Title  She will be able to carry 15 pounds in RT arm without incr pain    Time  8    Period  Weeks    Status  New      PT LONG TERM GOAL #4   Title  She will report able to sleep without waking due to pain    Time  8    Period  Weeks    Status  New      PT LONG TERM GOAL #5   Title  FOTO score improved to 48% limited to demo functinal improvement    Time  8    Period  Weeks    Status  New            Plan - 02/19/18 3149    Clinical Impression Statement  ROM all improved but pain unchanged.       PT Treatment/Interventions  Dry needling;Moist Heat;Ultrasound;Iontophoresis 15m/ml Dexamethasone;Therapeutic exercise;Patient/family education;Manual techniques    PT Next Visit Plan  manual and modalities    PT Home Exercise Plan  scpula retraction /elevation and depression,  aterior and posterior capsule sleeper stretch    Consulted and Agree with Plan of Care  Patient       Patient will benefit from skilled therapeutic intervention in order to improve the following deficits and impairments:  Pain, Impaired UE functional use, Decreased range of motion  Visit Diagnosis: Stiffness of right shoulder, not elsewhere classified  Chronic right shoulder pain     Problem List Patient Active Problem  List   Diagnosis Date Noted  . Uncontrolled type 2 diabetes mellitus with hyperglycemia ([place]) 02/05/2018  . Rotator cuff tendinitis, right 09/20/2017  . Persistent atrial fibrillation ([place]) 08/17/2017  . Pacemaker battery depletion 02/15/2017  . Right knee pain 07/14/2015  . Tremor of left hand 06/12/2015  . (HFpEF) heart failure with preserved ejection  fraction ([place]) 05/21/2015  . Difficulty walking 04/27/2015  . Right hip pain 04/08/2015  . Left shoulder pain 12/01/2014  . Osteoarthritis, multiple sites 11/20/2014  . Failed total right knee replacement ([place]) 09/24/2014  . Itching 09/10/2014  . Shortness of breath 10/29/2013  . Health care maintenance 06/26/2013  . Complete heart block ([place])   . Normal coronary arteries-Feb 2011   . Pacemaker   . Weight loss, unintentional 12/21/2012  . Osteoarthritis of right knee 08/22/2012  . Insomnia 03/08/2012  . Allergy to bee sting 10/04/2011  . Low back pain radiating to both legs 09/07/2011  . Hypothyroidism 12/28/2009  . GLAUCOMA 12/25/2009  . GERD 01/19/2009  . COPD (chronic obstructive pulmonary disease) ([place]) 11/06/2008  . Abnormal involuntary movement 09/03/2008  . Essential hypertension 06/14/2007  . DM (diabetes mellitus), type 2 ([place]) 11/06/2006  . DEPRESSION 11/06/2006  . Hyperlipidemia 11/03/2006    Darrel Hoover PT 02/19/2018, 9:34 AM  [facility] [address] [place], [place], [address] Phone: [phone]   Fax:  [phone]  Name: Mercedes Barr MRN: [id number] Date of Birth: 10-Aug-1945  PHYSICAL THERAPY DISCHARGE SUMMARY  Visits from Start of Care: 4  Current functional level related to goals / functional outcomes: See above , She did not return after this   Remaining deficits: See above   Education / Equipment: HEP Plan:                                                    Patient goals were not met. Patient is being discharged due to not returning since the last visit.  ?????  Pearson Forster PT  04/12/18

## 2018-02-20 MED ORDER — FREESTYLE LIBRE READER DEVI: 1.0000 | Freq: Once | 0 refills | Status: AC

## 2018-02-20 MED ORDER — FREESTYLE LIBRE SENSOR SYSTEM MISC
3 refills | Status: DC
Start: 2018-02-20 — End: 2018-05-23

## 2018-02-20 NOTE — Addendum Note (Signed)
Addended by: Leavy Cella on: 02/20/2018 02:13 PM   Modules accepted: Orders

## 2018-02-20 NOTE — Telephone Encounter (Addendum)
Patient returned phone call message request I made 7/15 to call back and discuss potential for alternative blood sugar monitoring system.   Patient reports she is current checking blood sugar 2-3 times per day.  Also reports once daily insulin injection however she notes she does take an extra dose if her blood sugar is running high.   FreeStyle Libre monitor was discussed.  She shared that she has two insurances (dual eligible).     I informed her I would submit a new Freestyle Libre monitor request to the pharmacy.  If covered, I am happy to help with her initial education and use.     New Rx sent to [facility]. For both sensor and reader.

## 2018-02-21 ENCOUNTER — Ambulatory Visit: Payer: Medicare Other | Admitting: Physical Therapy

## 2018-02-23 DIAGNOSIS — M25511 Pain in right shoulder: Secondary | ICD-10-CM | POA: Diagnosis not present

## 2018-02-26 ENCOUNTER — Ambulatory Visit: Payer: Medicare Other

## 2018-02-28 ENCOUNTER — Ambulatory Visit: Payer: Medicare Other | Admitting: Physical Therapy

## 2018-03-06 ENCOUNTER — Telehealth: Payer: Self-pay | Admitting: *Deleted

## 2018-03-06 NOTE — Telephone Encounter (Signed)
Pt informed. Anatasia Tino Dawn, CMA  

## 2018-03-06 NOTE — Telephone Encounter (Signed)
Patient states that Dr. Raliegh Ip suggested she have surgery on her shoulder.  She states that they were supposed to get in touch with Dr. Andy Gauss and wants to know if they did.  She would also like Dr. Preston Fleeting opinion. Fleeger, [name], CMA

## 2018-03-06 NOTE — Telephone Encounter (Signed)
Janett Billow,  I will check my physical box when I am in clinic tomorrow. Would be glad to talk to her about it, will call her tomorrow. You can let her know.  Thanks  Marjie Skiff, MD [place], PGY-3

## 2018-03-07 NOTE — Telephone Encounter (Signed)
Called and talked to patient about her surgery. She will move forward with the operation.  Thank you   Marjie Skiff, MD [place], PGY-3

## 2018-03-08 ENCOUNTER — Other Ambulatory Visit: Payer: Self-pay | Admitting: Family Medicine

## 2018-03-08 ENCOUNTER — Telehealth: Payer: Self-pay

## 2018-03-08 NOTE — Telephone Encounter (Signed)
Called and talk to the patient, will send new script with new regimen. She call next week to update [place] on blood glucose. I will have her follow up with me or Dr. Valentina Lucks as needed.  Thanks  Marjie Skiff, MD [place], PGY-3

## 2018-03-08 NOTE — Telephone Encounter (Signed)
Patient called to say her CBG is running around 200 or slightly above still. Has had one reading of 160 in the last month.  Stated she changed her Lantus dosing about a month ago and is using 15 units in the AM and 10 units at night for last month.  Needs Lantus refilled but can't get until September at current directions.   Unhappy with am CBG readings and needs advice on Lantus dosing and new RX sent. Still using her Metformin BID.  Call back is [phone]  Danley Danker, RN [facility])

## 2018-03-09 ENCOUNTER — Other Ambulatory Visit: Payer: Self-pay | Admitting: Family Medicine

## 2018-03-09 ENCOUNTER — Telehealth: Payer: Self-pay | Admitting: Cardiovascular Disease

## 2018-03-09 MED ORDER — INSULIN GLARGINE 100 UNIT/ML SOLOSTAR PEN: 25.0000 [IU] | PEN_INJECTOR | Freq: Every day | SUBCUTANEOUS | 99 refills | Status: DC

## 2018-03-09 NOTE — Telephone Encounter (Signed)
   Primary Cardiologist: Sanda Klein, MD  Chart reviewed as part of pre-operative protocol coverage.   Will route to pharm for input on Eliquis.  [facility] staff, can you call to find out if they need to hold aspirin?  Mickal Meno PA-C 03/09/2018 3:18 PM

## 2018-03-09 NOTE — Progress Notes (Signed)
Lantus sent to [facility] on [address] 25 Units daily. Please let patient know.  Thanks  Marjie Skiff, MD [place], PGY-3

## 2018-03-09 NOTE — Telephone Encounter (Signed)
New message       [facility] Pre-operative Risk Assessment    Request for surgical clearance:  1. What type of surgery is being performed? RIGHT SHOULDER SCOPE  2. When is this surgery scheduled? TBD  3. What type of clearance is required (medical clearance vs. Pharmacy clearance to hold med vs. Both)? BOTH  4. Are there any medications that need to be held prior to surgery and how long?  5. Practice name and name of physician performing surgery? Raliegh Ip  6. What is your office phone number [phone] ext [phone]   7.   What is your office fax number [phone]  8.   Anesthesia type (None, local, MAC, general) ? general   Laurier Nancy 03/09/2018, 12:44 PM  _________________________________________________________________   (provider comments below)

## 2018-03-09 NOTE — Progress Notes (Unsigned)
Patient calling because new Lantus Rx has not been sent. Please send today so she can have before the weekend.  Danley Danker, RN [facility] RN)

## 2018-03-09 NOTE — Telephone Encounter (Signed)
Mercedes Barr and Mercedes Barr will need for you to call them in regards to pts surgery. Their office # [phone]. Per patient, you can call them to discuss and they will then move forward with her surgery.

## 2018-03-09 NOTE — Telephone Encounter (Signed)
Called Raliegh Ip, left a message for Magda Paganini, surgery scheduler, re: to see if pt needs to hold Aspirin for her upcoming sx as well. Left a message for Magda Paganini to call back and ask for the preop pool.

## 2018-03-12 ENCOUNTER — Ambulatory Visit: Payer: Medicare Other | Admitting: Registered"

## 2018-03-12 DIAGNOSIS — H401131 Primary open-angle glaucoma, bilateral, mild stage: Secondary | ICD-10-CM | POA: Diagnosis not present

## 2018-03-12 NOTE — Telephone Encounter (Signed)
Patient with diagnosis of Afib on Eliquis for anticoagulation.    Procedure: right shoulder scope Date of procedure: TBD  CHADS2-VASc score of  6 (CHF, HTN, AGE, DM2, stroke/tia x 2, CAD, AGE, female)  CrCl 67ml/min  Per office protocol, patient can hold Eliquis for 1-2 days prior to procedure.

## 2018-03-14 LAB — CUP PACEART REMOTE DEVICE CHECK
Battery Impedance: 136 Ohm
Battery Remaining Longevity: 139 mo
Brady Statistic RV Percent Paced: 99 %
Implantable Lead Implant Date: 19990203
Implantable Lead Implant Date: 19990203
Implantable Lead Location: 753859
Implantable Lead Model: 5068
Implantable Lead Model: 5092
Implantable Pulse Generator Implant Date: 20180711
Lead Channel Impedance Value: 634 Ohm
Lead Channel Impedance Value: 67 Ohm
Lead Channel Pacing Threshold Pulse Width: 0.4 ms
Lead Channel Setting Pacing Amplitude: 2.5 V
Lead Channel Setting Pacing Pulse Width: 0.46 ms
Lead Channel Setting Sensing Sensitivity: 4 mV
MDC IDC LEAD LOCATION: 753860
MDC IDC MSMT BATTERY VOLTAGE: 2.78 V
MDC IDC MSMT LEADCHNL RV PACING THRESHOLD AMPLITUDE: 0.625 V
MDC IDC SESS DTM: 20190710111451

## 2018-03-15 NOTE — Telephone Encounter (Signed)
Follow Up:      Granddaughter was told told by surgeon to call and check on the status of pt's clearance.

## 2018-03-15 NOTE — Telephone Encounter (Signed)
Spoke with Mercedes Barr from requesting office and she states that they like for their pt's to hold all Blood thinners and anti-inflammatory drugs but it is completely up to the cardiologist rather or not pt should hold aspirin

## 2018-03-16 ENCOUNTER — Other Ambulatory Visit: Payer: Self-pay | Admitting: Cardiology

## 2018-03-16 NOTE — Telephone Encounter (Signed)
   Primary Cardiologist: Sanda Klein, MD  Chart reviewed as part of pre-operative protocol coverage. Patient was contacted 03/16/2018 in reference to pre-operative risk assessment for pending surgery as outlined below.  Mercedes Barr has a history of persistent atrial fibrillation, rate controlled, no CAD.  He also has heart failure with preserved EF and complete heart block with permanent pacemaker.  She was last seen on 11/15/2017 by Dr. Sallyanne Kuster.  Since that day, Mercedes Barr has done well from a cardiac standpoint.  Takes care of herself and is active around her home without any exertional chest tightness or dyspnea.  She has had no palpitations, lightheadedness or syncope.  Therefore, based on ACC/AHA guidelines, the patient would be at acceptable risk for the planned procedure without further cardiovascular testing.   According to our pharmacy protocol: Patient with diagnosis of Afib on Eliquis for anticoagulation.    Procedure: right shoulder scope Date of procedure: TBD  CHADS2-VASc score of  6 (CHF, HTN, AGE, DM2, stroke/tia x 2, CAD, AGE, female)  CrCl 57ml/min  Per office protocol, patient can hold Eliquis for 1-2 days prior to procedure.    Patient is no longer on aspirin.  I will route this recommendation to the requesting party via Epic fax function and remove from pre-op pool.  Please call with questions.  Daune Perch, NP 03/16/2018, 5:34 PM

## 2018-03-28 IMAGING — MG 2D DIGITAL SCREENING BILATERAL MAMMOGRAM WITH CAD AND ADJUNCT TO
9 of 12 series · 9 of 28 positions shown · non-contrast
Comparison: Previous exam(s).

CLINICAL DATA: Screening.

EXAM:
2D DIGITAL SCREENING BILATERAL MAMMOGRAM WITH CAD AND ADJUNCT TOMO

[R CC synth-2D]
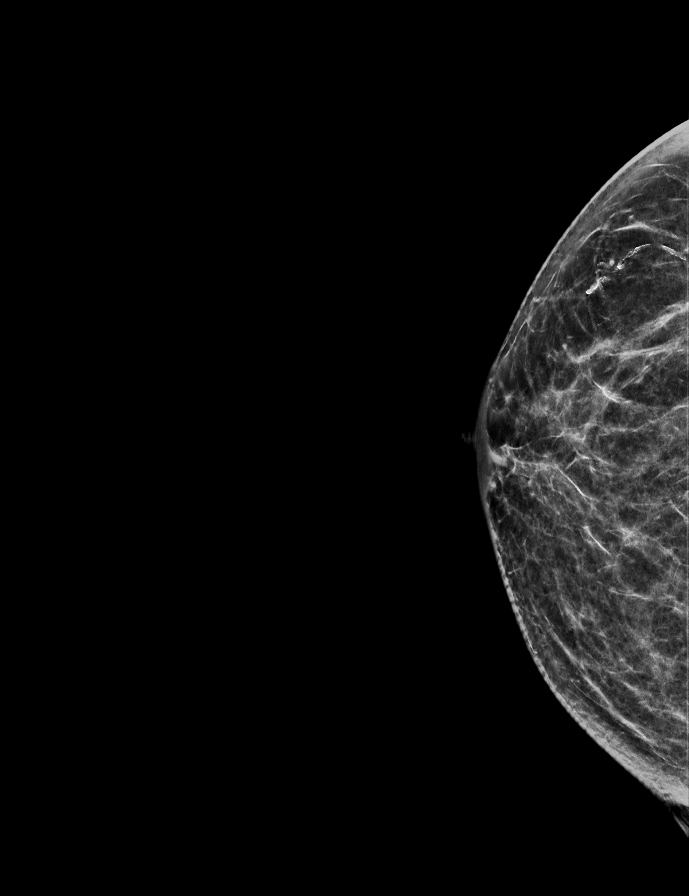

[L CC synth-2D]
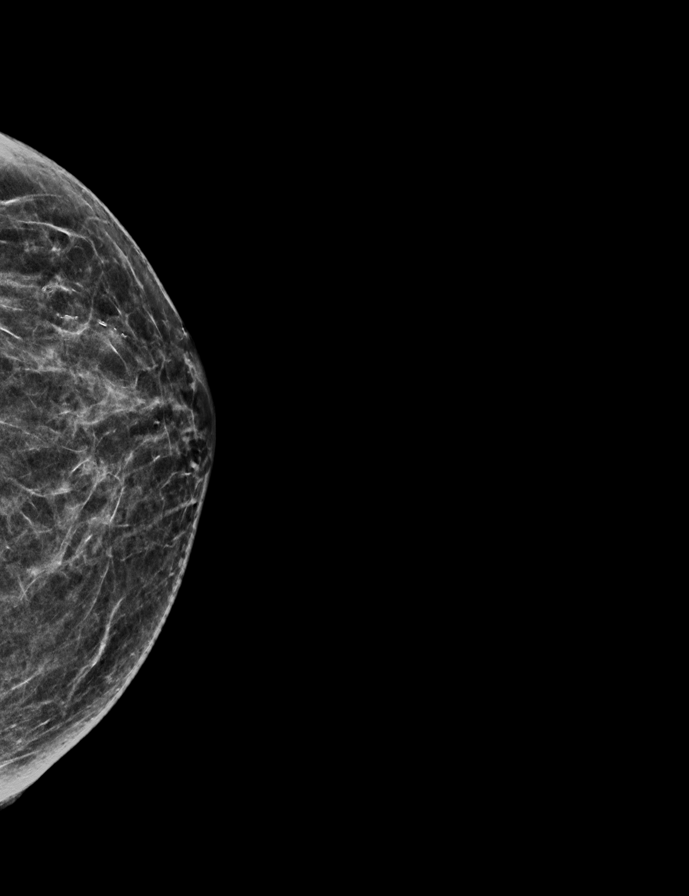

[L CC]
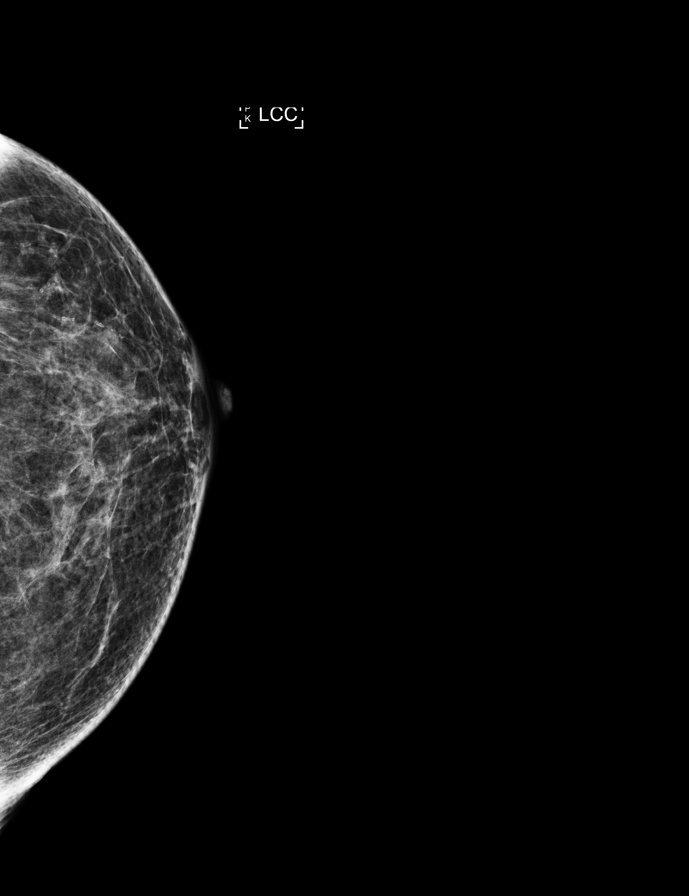

[R MLO synth-2D]
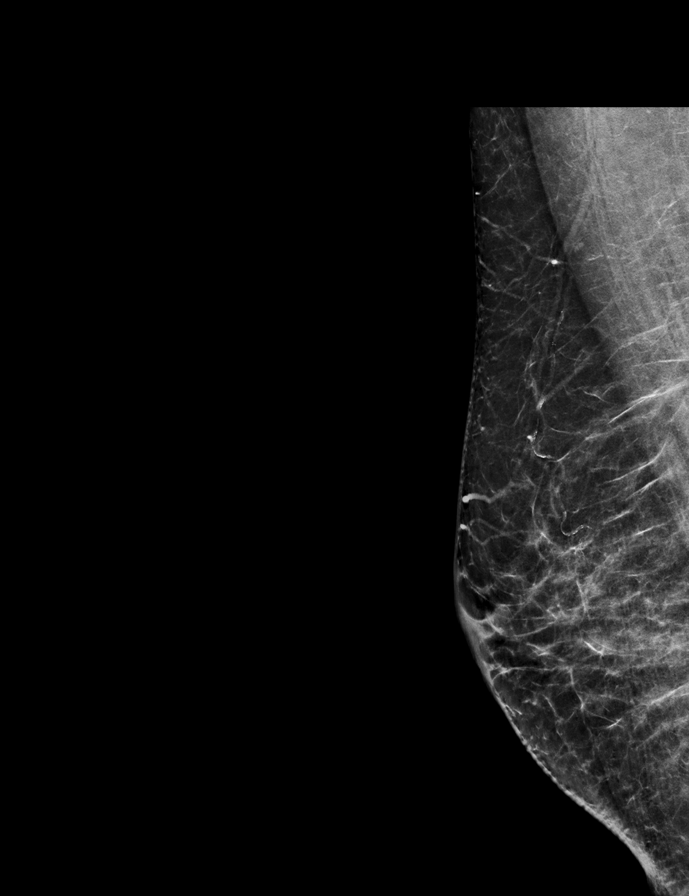

[L MLO synth-2D]
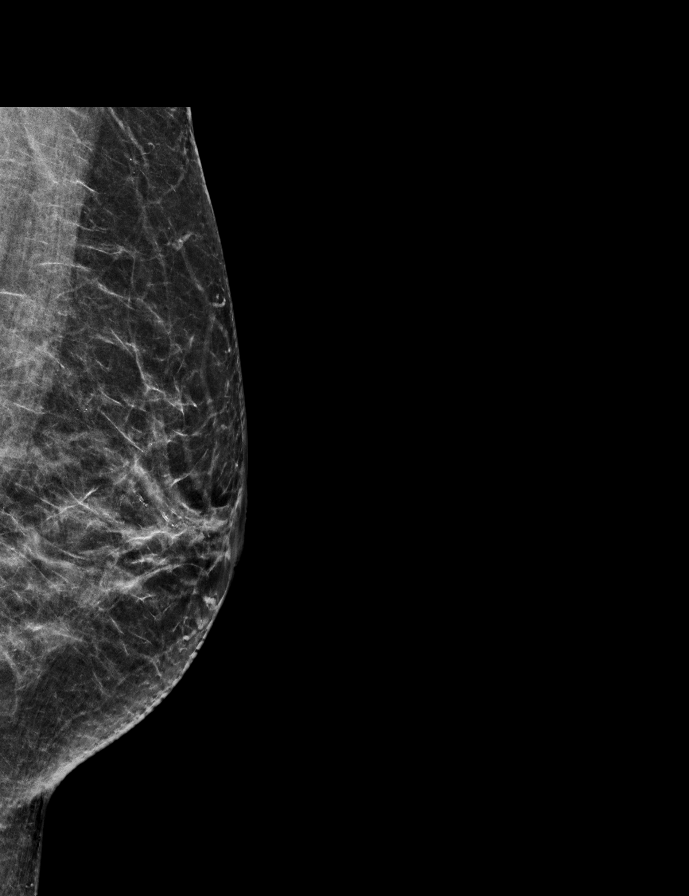

[R MLO]
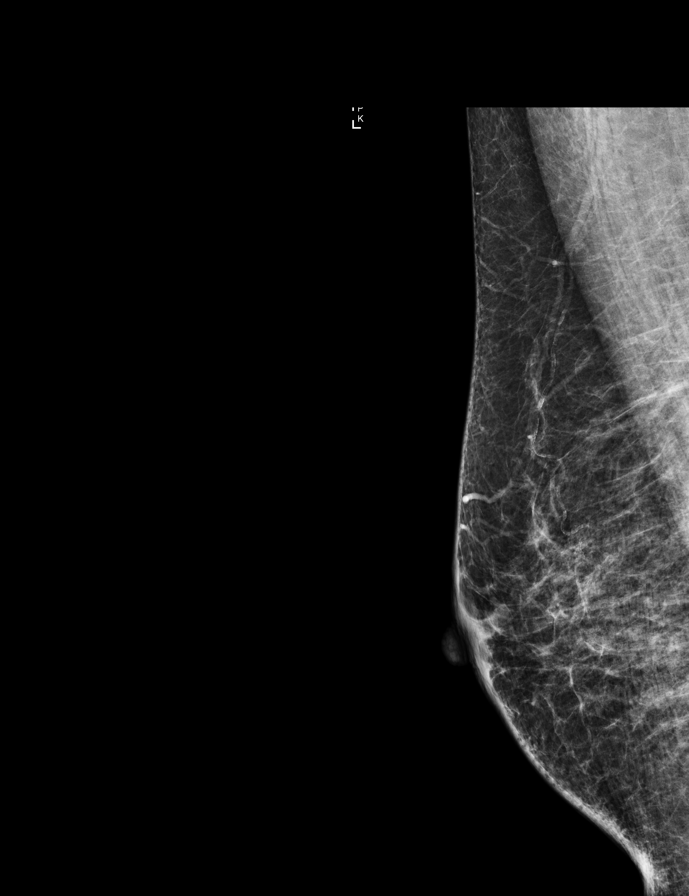

[L MLO]
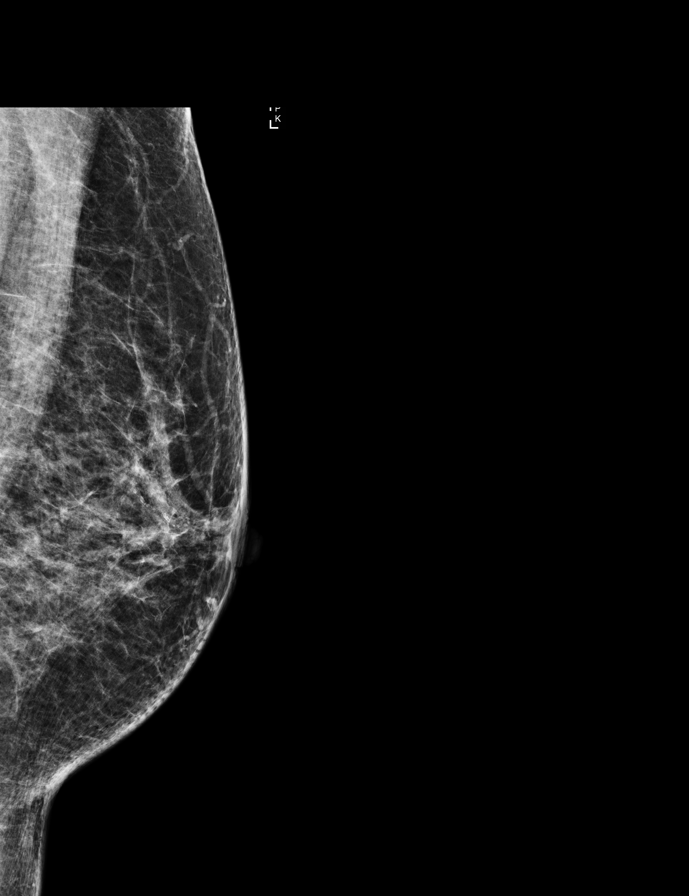

[R CC]
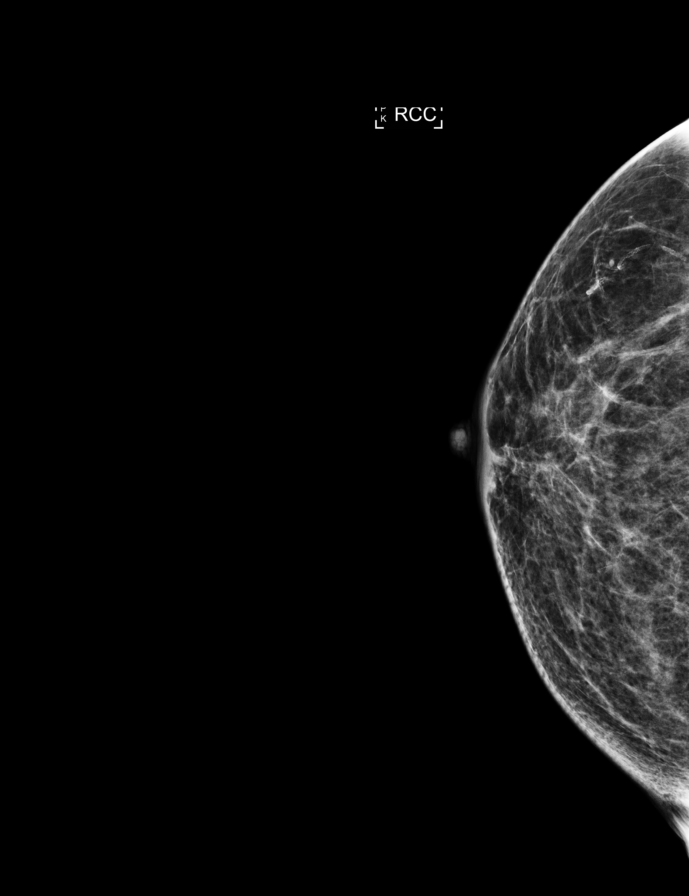

[R CC tomo · tomo slice 27/54.0]
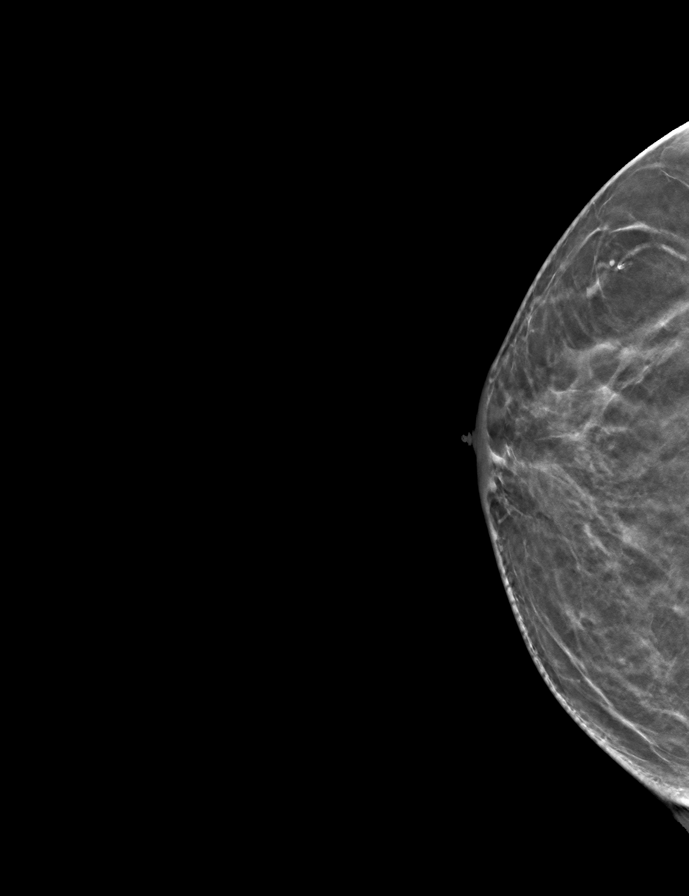

[9 of 28 positions shown; findings below may reference images not displayed]

ACR Breast Density Category b: There are scattered areas of
fibroglandular density.
FINDINGS: There are no findings suspicious for malignancy. Images were
processed with CAD.
IMPRESSION: No mammographic evidence of malignancy. A result letter of this
screening mammogram will be mailed directly to the patient.

RECOMMENDATION:
Screening mammogram in one year. (Code:97-6-RS4)

BI-RADS CATEGORY  1: Negative.

## 2018-03-29 DIAGNOSIS — M24511 Contracture, right shoulder: Secondary | ICD-10-CM | POA: Diagnosis not present

## 2018-03-29 DIAGNOSIS — M24111 Other articular cartilage disorders, right shoulder: Secondary | ICD-10-CM | POA: Diagnosis not present

## 2018-03-29 DIAGNOSIS — M7501 Adhesive capsulitis of right shoulder: Secondary | ICD-10-CM | POA: Diagnosis not present

## 2018-03-29 DIAGNOSIS — M19011 Primary osteoarthritis, right shoulder: Secondary | ICD-10-CM | POA: Diagnosis not present

## 2018-03-29 DIAGNOSIS — S46011A Strain of muscle(s) and tendon(s) of the rotator cuff of right shoulder, initial encounter: Secondary | ICD-10-CM | POA: Diagnosis not present

## 2018-03-29 DIAGNOSIS — G8918 Other acute postprocedural pain: Secondary | ICD-10-CM | POA: Diagnosis not present

## 2018-03-29 DIAGNOSIS — S43431A Superior glenoid labrum lesion of right shoulder, initial encounter: Secondary | ICD-10-CM | POA: Diagnosis not present

## 2018-03-29 DIAGNOSIS — M7541 Impingement syndrome of right shoulder: Secondary | ICD-10-CM | POA: Diagnosis not present

## 2018-03-30 ENCOUNTER — Other Ambulatory Visit: Payer: Self-pay | Admitting: Family Medicine

## 2018-03-30 DIAGNOSIS — I5030 Unspecified diastolic (congestive) heart failure: Secondary | ICD-10-CM

## 2018-03-30 DIAGNOSIS — I10 Essential (primary) hypertension: Secondary | ICD-10-CM

## 2018-04-11 DIAGNOSIS — M24511 Contracture, right shoulder: Secondary | ICD-10-CM | POA: Diagnosis not present

## 2018-04-11 DIAGNOSIS — M7541 Impingement syndrome of right shoulder: Secondary | ICD-10-CM | POA: Diagnosis not present

## 2018-04-19 ENCOUNTER — Ambulatory Visit: Payer: Medicare Other | Admitting: Physical Therapy

## 2018-04-25 DIAGNOSIS — M24511 Contracture, right shoulder: Secondary | ICD-10-CM | POA: Diagnosis not present

## 2018-04-26 ENCOUNTER — Ambulatory Visit: Payer: Medicare Other | Admitting: Physical Therapy

## 2018-05-16 ENCOUNTER — Ambulatory Visit (INDEPENDENT_AMBULATORY_CARE_PROVIDER_SITE_OTHER): Payer: Medicare Other | Admitting: *Deleted

## 2018-05-16 DIAGNOSIS — I442 Atrioventricular block, complete: Secondary | ICD-10-CM | POA: Diagnosis not present

## 2018-05-16 NOTE — Progress Notes (Signed)
Remote pacemaker transmission.   

## 2018-05-23 ENCOUNTER — Other Ambulatory Visit: Payer: Self-pay | Admitting: Pharmacist

## 2018-05-23 NOTE — Addendum Note (Signed)
Addended by: De Hollingshead on: 05/23/2018 12:37 PM   Modules accepted: Orders

## 2018-05-23 NOTE — Patient Outreach (Signed)
[place] [facility]) Care Management  05/23/2018  KYE SILVERSTEIN 10/25/1945 [id number]   72 year old female referred to [place] for medication review due to High Risk review of [employer] beneficiaries. PMHx includes, but not limited to, Afib, T2DM, CHF, COPD.   Patient was contacted today to review medications due to multiple [facility] codes and elevated RAF score.  Left HIPAA compliant message for patient to return my call at her convenience.   Plan - Will f/u in 2-3 business days if I have not heard back.   Catie Darnelle Maffucci, PharmD PGY2 Ambulatory Care Pharmacy Resident, [facility] Phone: [phone]

## 2018-05-23 NOTE — Patient Outreach (Signed)
[place] [facility]) Care Management  [place]   05/23/2018  Mercedes Barr 1946-05-09 [id number]  72 year old female referred to [place] for medication review due to High Risk review of [employer] beneficiaries. PMHx includes, but not limited to, HFpEF, Afib, ICD, HTN, T2DM, COPD, depression.   Patient was contacted today to review medications due to multiple [facility] codes and elevated RAF score. HIPAA verifiers identified.   Patient returned my call to review medications. She is particularly concerned about her diabetes.   T2DM:  - Reports compliance with Lantus 25 units daily and metformin 2000 mg daily. She is checking SMBG BID, fasting and before bed (couldn't not quantify how long after supper this was). Reports readings generally in the 200s, with the lowest in the past few weeks being ~170 and highest ~330. She notes that she is concerned about her BG results, as she has family members with different microvascular complications of DM. She notes that she is NOT taking Januvia, that she understood her PCP to have discontinued it with glipizide when insulin was started  S/p Right Shoulder Scope: - Reports this procedure occurred on 03/29/2018, and she is slowly recovering with some lingering pain. She notes that she used up her tramadol prescription and has been using acetaminophen daily since then. She notes that taking 1 methocarbamol in the evening before going to bed helps with shoulder pain/muscle pain and helps her sleep  COPD: - Patient notes that she has been using albuterol HFA or albuterol nebulized most every day; she uses the nebulizer if her breathing problems are more severe. She indicates that her breathing is worse "this time of year". She indicates that she uses Flovent HFA PRN.   Objective: Lab Results  Component Value Date   CREATININE 0.97 09/19/2017   CREATININE 0.90 02/10/2017   CREATININE 0.84 04/04/2016    Lab Results   Component Value Date   HGBA1C 12.3 (A) 01/10/2018    Lipid Panel     Component Value Date/Time   CHOL 92 (L) 09/19/2017 1414   TRIG 169 (H) 09/19/2017 1414   HDL 31 (L) 09/19/2017 1414   CHOLHDL 3.0 09/19/2017 1414   CHOLHDL 2.6 04/04/2016 1415   VLDL 29 04/04/2016 1415   LDLCALC 27 09/19/2017 1414   LDLDIRECT 56 06/08/2012 1120    BP Readings from Last 3 Encounters:  01/10/18 112/60  12/08/17 112/68  11/15/17 122/76    Allergies  Allergen Reactions  . Chlorhexidine Itching and Rash  . Naproxen Itching and Swelling  . Pregabalin Nausea And Vomiting and Itching  . Statins Other (See Comments)    REACTION: leg pains PT IS ABLE TO TOLERATE CRESTOR  . Amitriptyline Hcl Itching and Rash  . Benazepril Hcl Itching and Rash  . Gabapentin Itching  . Oxycodone-Acetaminophen Itching  . Propoxyphene Itching    Medications Reviewed Today    Reviewed by De Hollingshead, [facility] (Pharmacist) on 05/23/18 at 1222  Med List Status: <None>  Medication Order Taking? Sig Documenting Provider Last Dose Status Informant  acetaminophen (TYLENOL) 325 MG tablet [id number] Yes Take 650 mg by mouth every 6 (six) hours as needed for moderate pain (Right knee pain). [provider] Taking Active Self           Med Note Darnelle Maffucci, Lavonna Rua May 23, 2018 12:04 PM) Taking more often s/p surgery as she ran out of tramadol  albuterol [facility] HFA) 108 [phone] Base) MCG/ACT inhaler [id number]  Yes Use 2 puffs every 4 hours  as needed for wheezing Diallo, Abdoulaye, MD Taking Active   albuterol (PROVENTIL) (2.5 MG/3ML) 0.083% nebulizer solution [id number] Yes USE 1 VIAL VIA NEBULIZER  EVERY 4 HOURS AS NEEDED FOR WHEEZING Diallo, Abdoulaye, MD Taking Active         Discontinued 10/25/11 1718 (Reorder)   citalopram (CELEXA) 20 MG tablet [id number] Yes TAKE 1 TABLET BY MOUTH  EVERY MORNING Diallo, Abdoulaye, MD Taking Active   ELIQUIS 5 MG TABS tablet [id number] Yes TAKE 1 TABLET BY MOUTH TWO  TIMES  DAILY Croitoru, Mihai, MD Taking Active   EPIPEN 2-PAK 0.3 MG/0.3ML SOAJ injection [id number] No Inject 0.3 mLs (0.3 mg  total) into the muscle as  needed for severe allergic  reaction, and contact  medical provider.  Patient not taking:  Reported on 05/23/2018   Olam Idler, MD Not Taking Active Self  fluticasone [facility] HFA) 110 MCG/ACT inhaler [id number] Yes Inhale 1 puff into the lungs 2 (two) times daily. Marjie Skiff, MD Taking Active   furosemide (LASIX) 20 MG tablet [id number] Yes TAKE 1 TABLET BY MOUTH  EVERY MORNING Diallo, Abdoulaye, MD Taking Active   glucose blood (ONE TOUCH ULTRA TEST) test strip [id number]  Use for blood sugar testing 1-3 times daily Diallo, Abdoulaye, MD  Active   Insulin Glargine (LANTUS SOLOSTAR) 100 UNIT/ML Solostar Pen [id number] Yes Inject 25 Units into the skin daily. Marjie Skiff, MD Taking Active   Lancets MISC [id number] Yes Use with blood sugar monitor. Hilton Sinclair, MD Taking Active Self  levothyroxine (SYNTHROID, LEVOTHROID) 75 MCG tablet [id number] Yes Take 1 tablet by mouth  daily before breakfast Diallo, Abdoulaye, MD Taking Active   losartan (COZAAR) 25 MG tablet [id number] Yes TAKE 1 TABLET BY MOUTH  DAILY Diallo, Abdoulaye, MD Taking Active   metFORMIN (GLUCOPHAGE) 500 MG tablet [id number] Yes TAKE 2 TABLETS BY MOUTH 2  TIMES DAILY WITH MEALS. Marjie Skiff, MD Taking Active   methocarbamol (ROBAXIN) 500 MG tablet [id number] Yes Take 1 tablet (500 mg total) by mouth every 6 (six) hours as needed for muscle spasms. Marjie Skiff, MD Taking Active            Med Note Darnelle Maffucci, Arville Lime   Wed May 23, 2018 12:14 PM) Taking ~ 1x daily before bed  Omega-3 Fatty Acids (FISH OIL) 1000 MG CAPS [id number] Yes Take 1 capsule by mouth every morning. Reported on 02/11/2016 [provider] Taking Active Self  omeprazole (PRILOSEC) 20 MG capsule [id number] Yes TAKE 1 CAPSULE BY MOUTH TWO TIMES DAILY Diallo, Abdoulaye, MD Taking Active             Med Note De Hollingshead   Wed May 23, 2018 12:14 PM) Taking once daily in the evening  polyethylene glycol powder (GLYCOLAX/MIRALAX) powder [id number] Yes TAKE 17 GRAMS BY MOUTH 2  TIMES DAILY AS NEEDED FOR  LAXATIVE Diallo, Abdoulaye, MD Taking Active   potassium chloride SA (K-DUR,KLOR-CON) 20 MEQ tablet [id number] Yes TAKE 1 TABLET BY MOUTH  DAILY Diallo, Abdoulaye, MD Taking Active   propranolol (INDERAL) 40 MG tablet [id number] Yes Take 1 tablet (40 mg total) by mouth daily. Marjie Skiff, MD Taking Active   rosuvastatin (CRESTOR) 20 MG tablet [id number] Yes Take 1 tablet (20 mg total) by mouth daily. Marjie Skiff, MD Taking Active   sitaGLIPtin (JANUVIA) 100 MG tablet [id number] No Take 1 tablet (100 mg total) by mouth every morning.  Patient not taking:  Reported on 02/05/2018  Marjie Skiff, MD Not Taking Active   traMADol (ULTRAM) 50 MG tablet [id number] No Take one by mouth twice a day for arthritis pain  Patient not taking:  Reported on 05/23/2018   Dickie La, MD Not Taking Active            Med Note Kelby Aline May 23, 2018 12:16 PM) Patient ran out  Travoprost, BAK Free, (TRAVATAN) 0.004 % SOLN ophthalmic solution [id number] Yes Place 1 drop into both eyes at bedtime. Hilton Sinclair, MD Taking Active Self  traZODone (DESYREL) 50 MG tablet [id number] Yes TAKE 1 TABLET BY MOUTH AT  BEDTIME AS NEEDED FOR SLEEP Diallo, Abdoulaye, MD Taking Active            Med Note De Hollingshead   Wed May 23, 2018 12:17 PM) Dewaine Conger 1-2x/week          Assessment:  Drugs sorted by system:  Neurologic/Psychologic: citalopram, trazodone  Cardiovascular: furosemide, losartan, omega 3 fatty acids, propranolol, rosuvastatin  Pulmonary/Allergy: albuterol HFA, albuterol nebulizer, Flovent HFA  Gastrointestinal: omeprazole, Miralax  Endocrine: Lantus, metformin, levothyroxine  Topical: travoprost ophthalmic   Pain: acetaminophen, tramadol,  methocarbamol  Vitamins/Minerals/Supplements: potassium  T2DM: - Fasting BG indicates patient's DM is still uncontrolled. Would recommend further basal insulin titration to target fasting BG. Patient may be a candidate for SGLT2 therapy d/t HF, though current significant elevations in BG may put her at a higher risk for genitourinary infections. Moving forward, consider addition of SGLT2 ([employer], [employer] covered on patient's current insurance plan) once BG better controlled - Important to clarify Januvia therapy. Could consider reinitiating, if patient derived benefit from it previously. Would likely avoid GLP1 due to side effects of weight loss in this patient; DPP4 therapy like Januvia typically does not have weight loss - Counseled patient to continue checking fasting BG and checking 2 hours post-supper to evaluate post-prandial control; patient to bring these results to appt with PCP later this month  COPD: - Patient has a dx of COPD, but is currently only being managed on PRN ICS therapy. Per chart review, it appears PFT results from 09/2005 indicate "mild obstruction airway disease, mod restriction of inhaled volume". Consider reevaluation for necessity of LABA or LAMA therapy, if patient does have COPD, instead of monotherapy with ICS  S/p Right Shoulder Scope: - Patient encouraged to f/u with Doree Fudge regarding pain management, as she ran out of tramadol too soon to refill.  Plan: - Will route note to PCP for review - Patient has my contact information; encouraged to reach out with any future questions or concerns   Catie Darnelle Maffucci, PharmD PGY2 Ambulatory Care Pharmacy Resident, [place] Phone: [phone]

## 2018-06-05 ENCOUNTER — Telehealth: Payer: Self-pay | Admitting: Family Medicine

## 2018-06-05 NOTE — Telephone Encounter (Signed)
Left voicemail reminding pt about their appointment for tomorrow, 06/06/18. -[place]

## 2018-06-06 ENCOUNTER — Ambulatory Visit: Payer: Medicare Other | Admitting: Family Medicine

## 2018-06-08 ENCOUNTER — Ambulatory Visit (INDEPENDENT_AMBULATORY_CARE_PROVIDER_SITE_OTHER): Payer: Medicare Other

## 2018-06-08 ENCOUNTER — Encounter (HOSPITAL_COMMUNITY): Payer: Self-pay

## 2018-06-08 ENCOUNTER — Ambulatory Visit (HOSPITAL_COMMUNITY)
Admission: EM | Admit: 2018-06-08 | Discharge: 2018-06-08 | Disposition: A | Discharge status: Home / Self Care | Payer: Medicare Other | Attending: Emergency Medicine | Admitting: Emergency Medicine

## 2018-06-08 DIAGNOSIS — R062 Wheezing: Secondary | ICD-10-CM

## 2018-06-08 DIAGNOSIS — R0789 Other chest pain: Secondary | ICD-10-CM | POA: Diagnosis not present

## 2018-06-08 DIAGNOSIS — J42 Unspecified chronic bronchitis: Secondary | ICD-10-CM

## 2018-06-08 DIAGNOSIS — R0602 Shortness of breath: Secondary | ICD-10-CM

## 2018-06-08 DIAGNOSIS — J441 Chronic obstructive pulmonary disease with (acute) exacerbation: Secondary | ICD-10-CM | POA: Diagnosis not present

## 2018-06-08 MED ORDER — ACETAMINOPHEN 500 MG PO TABS
1000.0000 mg | ORAL_TABLET | Freq: Once | ORAL | Status: AC
  Administered 2018-06-08: 1000 mg via ORAL

## 2018-06-08 MED ORDER — IPRATROPIUM-ALBUTEROL 0.5-2.5 (3) MG/3ML IN SOLN
RESPIRATORY_TRACT | Status: AC
  Filled 2018-06-08: qty 3

## 2018-06-08 MED ORDER — ACETAMINOPHEN 325 MG PO TABS
ORAL_TABLET | ORAL | Status: AC
  Filled 2018-06-08: qty 3

## 2018-06-08 MED ORDER — ALBUTEROL SULFATE HFA 108 (90 BASE) MCG/ACT IN AERS: 1.0000 | INHALATION_SPRAY | Freq: Four times a day (QID) | RESPIRATORY_TRACT | 0 refills | Status: DC | PRN

## 2018-06-08 MED ORDER — PREDNISONE 20 MG PO TABS
40.0000 mg | ORAL_TABLET | Freq: Once | ORAL | Status: AC
  Administered 2018-06-08: 40 mg via ORAL

## 2018-06-08 MED ORDER — AZITHROMYCIN 250 MG PO TABS: 250.0000 mg | ORAL_TABLET | Freq: Every day | ORAL | 0 refills | Status: DC

## 2018-06-08 MED ORDER — PREDNISONE 20 MG PO TABS
ORAL_TABLET | ORAL | Status: AC
  Filled 2018-06-08: qty 2

## 2018-06-08 MED ORDER — PREDNISONE 10 MG PO TABS
ORAL_TABLET | ORAL | Status: AC
  Filled 2018-06-08: qty 1

## 2018-06-08 MED ORDER — PREDNISONE 20 MG PO TABS: 40.0000 mg | ORAL_TABLET | Freq: Every day | ORAL | 0 refills | Status: AC

## 2018-06-08 MED ORDER — AEROCHAMBER PLUS MISC: 2 refills | Status: AC

## 2018-06-08 MED ORDER — IPRATROPIUM-ALBUTEROL 0.5-2.5 (3) MG/3ML IN SOLN
3.0000 mL | Freq: Once | RESPIRATORY_TRACT | Status: AC
  Administered 2018-06-08: 3 mL via RESPIRATORY_TRACT

## 2018-06-08 NOTE — ED Triage Notes (Signed)
Pt presents with cough, congestion, and SOB.  Pt also states she has been having some pain in her chest and back pain.

## 2018-06-08 NOTE — ED Provider Notes (Signed)
HPI  SUBJECTIVE:  Mercedes Barr is a 72 y.o. female who presents with 1 week of throbbing, constant, daily torso/back and chest pain, wheezing, shortness of breath.  She states that she is now coughing up yellow sputum, states is normally clear.  States that she is coughing up a larger amount than usual.  She denies fevers, dyspnea on exertion.  No abdominal pain.  No change in physical activity or trauma to the chest or back.  No lower extremity edema, nocturia, PND, orthopnea, unintentional weight gain.  No recent illness.  She has tried using her albuterol 2 puffs 3 times daily with mild improvement in her symptoms.  Normally uses her albuterol as needed.  Does not have a spacer.  Also tried some tramadol for her pain.  She states that her pain is worse with torso rotation, bending forward, deep inspiration.  She had a single dose of antibiotics prior to dental procedure last week.  No other antibiotics in the past 3 months.  No antipyretic in the past 4 to 6 hours.  She has a history of asthma, COPD.  She does not smoke.  She has a past medical history of GERD, hypertension, diabetes, A. fib, status post pacemaker, CHF with normal EF, she is on Eliquis for the A. fib.  Surgery in the past 4 weeks.  She states that she has had similar pains before and it was thought to be from asthma.  XBD:[id number], Earna Coder, MD   Past Medical History:  Diagnosis Date  . 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 Asthma   . Complete heart block ([place])   . COPD (chronic obstructive pulmonary disease) ([place])   . Coronary artery disease   . Diabetes mellitus   . GERD (gastroesophageal reflux disease)   . Heart murmur   . Hypertension   . Hypothyroidism   . Normal coronary arteries 2011  . Pacemaker    PACEMAKER DEPENDENT-DR. [place].  OFFICE NOTE DR. A. LITTLE STATES  "EXTREMELY PACEMAKER SENSITIVE AND WHEN YOU TRY TO CHECK FOR UNDERLYING RHYTHMS SHE WILL HAVE SNYCOPE AND WE HAVE NOT DONE  THIS NOW IN ABOUT 2 YEARS".  . Shortness of breath dyspnea    walking distance or climbing stairs    Past Surgical History:  Procedure Laterality Date  . ATRIOVENTRICULAR CUSHION DEFECT REPAIR  1988  . CARDIAC CATHETERIZATION  2011    SHOWED NO CAD-PER CARDIOLOGY OFFICE NOTES DR. A. LITTLE  . colonscopy      removed polyps   . INSERT / REPLACE / REMOVE PACEMAKER    . PACEMAKER INSERTION  1988   last gen 11/08- MDT  . PPM GENERATOR CHANGEOUT N/A 02/15/2017   Procedure: PPM Generator Changeout;  Surgeon: Sanda Klein, MD;  Location: [facility];  Service: Cardiovascular;  Laterality: N/A;  . TOTAL KNEE ARTHROPLASTY Right 11/19/2012   Procedure: RIGHT TOTAL KNEE ARTHROPLASTY;  Surgeon: Gearlean Alf, MD;  Location: [facility];  Service: Orthopedics;  Laterality: Right;  . TOTAL KNEE REVISION Right 09/24/2014   Procedure: RIGHT TOTAL KNEE ARTHROPLASTY REVISION;  Surgeon: Gearlean Alf, MD;  Location: [facility];  Service: Orthopedics;  Laterality: Right;  . TUBAL LIGATION      Family History  Problem Relation Age of Onset  . Heart failure Mother   . Hypertension Mother   . Stroke Father   . Cancer Brother   . Kidney disease Daughter   . Heart failure Sister  Social History   Tobacco Use  . Smoking status: Never Smoker  . Smokeless tobacco: Never Used  Substance Use Topics  . Alcohol use: No  . Drug use: No    No current facility-administered medications for this encounter.   Current Outpatient Medications:  .  acetaminophen (TYLENOL) 325 MG tablet, Take 650 mg by mouth every 6 (six) hours as needed for moderate pain (Right knee pain)., Disp: , Rfl:  .  albuterol (PROVENTIL HFA;VENTOLIN HFA) 108 (90 Base) MCG/ACT inhaler, Inhale 1-2 puffs into the lungs every 6 (six) hours as needed for wheezing or shortness of breath., Disp: 1 Inhaler, Rfl: 0 .  albuterol (PROVENTIL) (2.5 MG/3ML) 0.083% nebulizer solution, USE 1 VIAL VIA NEBULIZER  EVERY 4 HOURS AS NEEDED FOR  WHEEZING, Disp: 150 mL, Rfl: 0 .  azithromycin (ZITHROMAX) 250 MG tablet, Take 1 tablet (250 mg total) by mouth daily. 2 tabs po on day 1, 1 tab po on days 2-5, Disp: 6 tablet, Rfl: 0 .  citalopram (CELEXA) 20 MG tablet, TAKE 1 TABLET BY MOUTH  EVERY MORNING, Disp: 30 tablet, Rfl: 0 .  ELIQUIS 5 MG TABS tablet, TAKE 1 TABLET BY MOUTH TWO  TIMES DAILY, Disp: 180 tablet, Rfl: 1 .  EPIPEN 2-PAK 0.3 MG/0.3ML SOAJ injection, Inject 0.3 mLs (0.3 mg  total) into the muscle as  needed for severe allergic  reaction, and contact  medical provider. (Patient not taking: Reported on 05/23/2018), Disp: 2 Device, Rfl: 0 .  fluticasone (FLOVENT HFA) 110 MCG/ACT inhaler, Inhale 1 puff into the lungs 2 (two) times daily., Disp: 1 Inhaler, Rfl: 5 .  furosemide (LASIX) 20 MG tablet, TAKE 1 TABLET BY MOUTH  EVERY MORNING, Disp: 90 tablet, Rfl: 0 .  glucose blood (ONE TOUCH ULTRA TEST) test strip, Use for blood sugar testing 1-3 times daily, Disp: 100 each, Rfl: 12 .  Insulin Glargine (LANTUS SOLOSTAR) 100 UNIT/ML Solostar Pen, Inject 25 Units into the skin daily., Disp: 5 pen, Rfl: PRN .  Lancets MISC, Use with blood sugar monitor., Disp: 100 each, Rfl: 11 .  levothyroxine (SYNTHROID, LEVOTHROID) 75 MCG tablet, Take 1 tablet by mouth  daily before breakfast, Disp: 90 tablet, Rfl: 4 .  losartan (COZAAR) 25 MG tablet, TAKE 1 TABLET BY MOUTH  DAILY, Disp: 90 tablet, Rfl: 0 .  metFORMIN (GLUCOPHAGE) 500 MG tablet, TAKE 2 TABLETS BY MOUTH 2  TIMES DAILY WITH MEALS., Disp: 360 tablet, Rfl: 4 .  methocarbamol (ROBAXIN) 500 MG tablet, Take 1 tablet (500 mg total) by mouth every 6 (six) hours as needed for muscle spasms., Disp: 80 tablet, Rfl: 0 .  Omega-3 Fatty Acids (FISH OIL) 1000 MG CAPS, Take 1 capsule by mouth every morning. Reported on 02/11/2016, Disp: , Rfl:  .  omeprazole (PRILOSEC) 20 MG capsule, TAKE 1 CAPSULE BY MOUTH TWO TIMES DAILY, Disp: 180 capsule, Rfl: 1 .  polyethylene glycol powder (GLYCOLAX/MIRALAX) powder,  TAKE 17 GRAMS BY MOUTH 2  TIMES DAILY AS NEEDED FOR  LAXATIVE, Disp: 1054 g, Rfl: 0 .  potassium chloride SA (K-DUR,KLOR-CON) 20 MEQ tablet, TAKE 1 TABLET BY MOUTH  DAILY, Disp: 90 tablet, Rfl: 1 .  predniSONE (DELTASONE) 20 MG tablet, Take 2 tablets (40 mg total) by mouth daily with breakfast for 4 days., Disp: 8 tablet, Rfl: 0 .  propranolol (INDERAL) 40 MG tablet, Take 1 tablet (40 mg total) by mouth daily., Disp: 90 tablet, Rfl: 1 .  rosuvastatin (CRESTOR) 20 MG tablet, Take 1 tablet (20 mg  total) by mouth daily., Disp: 90 tablet, Rfl: 4 .  Spacer/Aero-Holding Chambers (AEROCHAMBER PLUS) inhaler, Use as instructed, Disp: 1 each, Rfl: 2 .  Travoprost, BAK Free, (TRAVATAN) 0.004 % SOLN ophthalmic solution, Place 1 drop into both eyes at bedtime., Disp: 1 Bottle, Rfl: 5 .  traZODone (DESYREL) 50 MG tablet, TAKE 1 TABLET BY MOUTH AT  BEDTIME AS NEEDED FOR SLEEP, Disp: 60 tablet, Rfl: 1  Allergies  Allergen Reactions  . Chlorhexidine Itching and Rash  . Naproxen Itching and Swelling  . Pregabalin Nausea And Vomiting and Itching  . Statins Other (See Comments)    REACTION: leg pains PT IS ABLE TO TOLERATE CRESTOR  . Amitriptyline Hcl Itching and Rash  . Benazepril Hcl Itching and Rash  . Gabapentin Itching  . Oxycodone-Acetaminophen Itching  . Propoxyphene Itching     ROS  As noted in HPI.   Physical Exam  BP (!) 139/106 (BP Location: Left Arm)   Pulse 83   Temp 97.9 F (36.6 C) (Oral)   Resp 20   SpO2 100%   Constitutional: Well developed, well nourished, no acute distress Eyes:  EOMI, conjunctiva normal bilaterally HENT: Normocephalic, atraumatic,mucus membranes moist Respiratory: Limited respiratory effort due to pain.  Positive diffuse lateral chest wall tenderness.  Poor air movement.  Lungs clear bilaterally. Cardiovascular: Normal rate, regular rhythm, no murmurs, rubs, gallops GI: nondistended skin: No rash, skin intact Musculoskeletal: calves symmetric, nontender,  no edema. Neurologic: Alert & oriented x 3, no focal neuro deficits Psychiatric: Speech and behavior appropriate   ED Course   Medications  ipratropium-albuterol (DUONEB) 0.5-2.5 (3) MG/3ML nebulizer solution 3 mL (3 mLs Nebulization Given 06/08/18 1919)  acetaminophen (TYLENOL) tablet 1,000 mg (1,000 mg Oral Given 06/08/18 1919)  ipratropium-albuterol (DUONEB) 0.5-2.5 (3) MG/3ML nebulizer solution 3 mL (3 mLs Nebulization Given 06/08/18 2020)  predniSONE (DELTASONE) tablet 40 mg (40 mg Oral Given 06/08/18 2019)    Orders Placed This Encounter  Procedures  . DG Chest 2 View    Standing Status:   Standing    Number of Occurrences:   1    Order Specific Question:   Reason for Exam (SYMPTOM  OR DIAGNOSIS REQUIRED)    Answer:   CP SOB h/o Asthma/COPD r/o effusion, PNA PTX  . Peak flow    Pretreatments: 150/250/350.  Get 3 post treatment readings    Standing Status:   Standing    Number of Occurrences:   1    Order Specific Question:   Pre and post Neb    Answer:   Yes    No results found for this or any previous visit (from the past 24 hour(s)). Dg Chest 2 View  Result Date: 06/08/2018 CLINICAL DATA:  Shortness of breath and wheezing EXAM: CHEST - 2 VIEW COMPARISON:  09/30/15 FINDINGS: Cardiac shadow is stable. Pacemaker is again seen. No pneumothorax is noted. The lungs are well aerated bilaterally. No sizable effusion is seen. No bony abnormality is noted. IMPRESSION: No acute abnormality noted. Electronically Signed   By: Inez Catalina M.D.   On: 06/08/2018 19:34    ED Clinical Impression  COPD exacerbation (HCC)  Chronic bronchitis, unspecified chronic bronchitis type [facility])   ED Assessment/Plan  Checking chest x-ray to rule out pneumonia, pneumothorax, pleural effusion.  Suspect that this is a COPD/asthma exacerbation.  Giving DuoNeb.  Doubt PE as she is already on Eliquis, doubt MI or other cardiac cause of her symptoms. Giving Tylenol thousand milligrams p.o. x1 for  pain.  Calculated peak flow: 266 Pretreatment peak flow 150/250/350. Posttreatment #1 peak flow: 110/140/150.  Patient states that she feels better Posttreatment peak #2 flow: 270/350/310.  Patient states that she feels significantly better.  Reviewed imaging independently. See radiology report for full details.  Presentation consistent with a COPD exacerbation.  Suspect that her chest and back and is coming from breathing through narrow airways. Home with an albuterol inhaler with a spacer 2 puffs every 4-6 hours on a regular basis for the next several days, 40 mg prednisone for 4 days, start tomorrow.  Also azithromycin Z-Pak.  Advised patient to follow-up with her doctor in several days because the steroids will elevate her glucose.  Gram of Tylenol up to 4 times a day as needed for pain.  Discussed  imaging, MDM, treatment plan, and plan for follow-up with patient. Discussed sn/sx that should prompt return to the ED. patient agrees with plan.   Meds ordered this encounter  Medications  . ipratropium-albuterol (DUONEB) 0.5-2.5 (3) MG/3ML nebulizer solution 3 mL  . acetaminophen (TYLENOL) tablet 1,000 mg  . ipratropium-albuterol (DUONEB) 0.5-2.5 (3) MG/3ML nebulizer solution 3 mL  . predniSONE (DELTASONE) tablet 40 mg  . albuterol (PROVENTIL HFA;VENTOLIN HFA) 108 (90 Base) MCG/ACT inhaler    Sig: Inhale 1-2 puffs into the lungs every 6 (six) hours as needed for wheezing or shortness of breath.    Dispense:  1 Inhaler    Refill:  0  . Spacer/Aero-Holding Chambers (AEROCHAMBER PLUS) inhaler    Sig: Use as instructed    Dispense:  1 each    Refill:  2  . predniSONE (DELTASONE) 20 MG tablet    Sig: Take 2 tablets (40 mg total) by mouth daily with breakfast for 4 days.    Dispense:  8 tablet    Refill:  0  . azithromycin (ZITHROMAX) 250 MG tablet    Sig: Take 1 tablet (250 mg total) by mouth daily. 2 tabs po on day 1, 1 tab po on days 2-5    Dispense:  6 tablet    Refill:  0    *This  clinic note was created using [employer]. Therefore, there may be occasional mistakes despite careful proofreading.   ?   Melynda Ripple, MD 06/08/18 2049

## 2018-06-08 NOTE — Discharge Instructions (Addendum)
Take two puffs from your albuterol inhaler every 4 hours. Finish the steroids unless your doctor tells you to stop. Finish the antibiotics, even if you feel better. Do a peak flow, once the morning and once at night. Write this down. The number should be going up, not down. You may decrease the frequency of your albuterol inhaler as the numbers go up and you start feeling better. You may start the steroids tomorrow, as you have already taken today's dose. You may take tylenol 1 gram up to 4 times a day as needed for pain. Make sure you drink extra fluids. Return to the ER if you get worse, have a fever >100.4, or any other concerns.   Go to www.goodrx.com to look up your medications. This will give you a list of where you can find your prescriptions at the most affordable prices. Or ask the pharmacist what the cash price is, or if they have any other discount programs available to help make your medication more affordable. This can be less expensive than what you would pay with insurance.

## 2018-06-12 DIAGNOSIS — H401131 Primary open-angle glaucoma, bilateral, mild stage: Secondary | ICD-10-CM | POA: Diagnosis not present

## 2018-06-21 LAB — CUP PACEART REMOTE DEVICE CHECK
Battery Impedance: 136 Ohm
Battery Remaining Longevity: 146 mo
Brady Statistic RV Percent Paced: 98 %
Implantable Lead Implant Date: 19990203
Implantable Lead Implant Date: 19990203
Implantable Lead Location: 753859
Implantable Lead Model: 5068
Implantable Pulse Generator Implant Date: 20180711
Lead Channel Impedance Value: 67 Ohm
Lead Channel Impedance Value: 745 Ohm
Lead Channel Setting Pacing Amplitude: 2.5 V
Lead Channel Setting Pacing Pulse Width: 0.4 ms
Lead Channel Setting Sensing Sensitivity: 4 mV
MDC IDC LEAD LOCATION: 753860
MDC IDC MSMT BATTERY VOLTAGE: 2.78 V
MDC IDC MSMT LEADCHNL RV PACING THRESHOLD AMPLITUDE: 0.625 V
MDC IDC MSMT LEADCHNL RV PACING THRESHOLD PULSEWIDTH: 0.4 ms
MDC IDC SESS DTM: 20191009114130

## 2018-06-25 ENCOUNTER — Other Ambulatory Visit: Payer: Self-pay | Admitting: Family Medicine

## 2018-06-25 DIAGNOSIS — I10 Essential (primary) hypertension: Secondary | ICD-10-CM

## 2018-06-25 DIAGNOSIS — I5030 Unspecified diastolic (congestive) heart failure: Secondary | ICD-10-CM

## 2018-06-26 ENCOUNTER — Other Ambulatory Visit: Payer: Self-pay | Admitting: Family Medicine

## 2018-07-25 ENCOUNTER — Other Ambulatory Visit: Payer: Self-pay

## 2018-07-25 ENCOUNTER — Ambulatory Visit (INDEPENDENT_AMBULATORY_CARE_PROVIDER_SITE_OTHER): Payer: Medicare Other | Admitting: Family Medicine

## 2018-07-25 ENCOUNTER — Encounter: Payer: Self-pay | Admitting: Family Medicine

## 2018-07-25 VITALS — BP 124/60 | Temp 98.0°F | Ht 62.0 in | Wt 135.0 lb

## 2018-07-25 DIAGNOSIS — E119 Type 2 diabetes mellitus without complications: Secondary | ICD-10-CM

## 2018-07-25 DIAGNOSIS — E1165 Type 2 diabetes mellitus with hyperglycemia: Secondary | ICD-10-CM | POA: Diagnosis not present

## 2018-07-25 DIAGNOSIS — Z23 Encounter for immunization: Secondary | ICD-10-CM

## 2018-07-25 LAB — POCT GLYCOSYLATED HEMOGLOBIN (HGB A1C): HbA1c, POC (controlled diabetic range): 11.4 % — AB (ref 0.0–7.0)

## 2018-07-25 MED ORDER — INSULIN LISPRO (1 UNIT DIAL) 100 UNIT/ML (KWIKPEN): 5.0000 [IU] | PEN_INJECTOR | Freq: Two times a day (BID) | SUBCUTANEOUS | 2 refills | Status: DC

## 2018-07-25 NOTE — Assessment & Plan Note (Signed)
A1c today is 11.4 still high though slightly better than last A1c in June 12.3. Patient is still uncontrolled despite metformin and Lantus with blood glucose fasting averaging 200 and nighttime around 250. Discuss making additional changes to her regimen.  --Will increase Lantus to 35 u from 25 u --Start Humalog 5 units with 2 biggest meals  --Continue Metformin 2000 mg daily --See her again in two month for A1c check --Will bring glucometer at next visit. --Discuss signs and symptoms of hypoglycemia with patient

## 2018-07-25 NOTE — Patient Instructions (Signed)
It was great seeing you today! We have addressed the following issues today  1. Increase your Lantus to 35 units a day 2. I want you to take 5 units of the fast acting insulin called Humalog with your 2 biggest meals of the day.  3. Come back in 2 months for a A1c check.  If we did any lab work today, and the results require attention, either me or my nurse will get in touch with you. If everything is normal, you will get a letter in mail and a message via . If you don't hear from [place] in two weeks, please give [place] a call. Otherwise, we look forward to seeing you again at your next visit. If you have any questions or concerns before then, please call the clinic at [phone].  Please bring all your medications to every doctors visit  Sign up for My Chart to have easy access to your labs results, and communication with your Primary care physician. Please ask Front Desk for some assistance.   Please check-out at the front desk before leaving the clinic.    Take Care,   Dr. Andy Gauss

## 2018-07-25 NOTE — Progress Notes (Signed)
Subjective:    Patient ID: Mercedes Barr, female    DOB: Dec 21, 1945, 72 y.o.   MRN: [id number]   CC: T2DM Follow up   HPI: Patient is a 72 yo female who present today to follow up on diabetes management. Patient has brought with her today a log of her BG measurement at home. Fasting in the morning ae averaging 200 and night time BG measurement are around 250. Patient continue to take metformin 2000 mg daily and Lantus 25 u in the morning. She denies polyuria and polydipsia but does endorse dietary indiscretion and decrease physical activity after shoulder surgery (right) and worsening knee pain. Patient currently has no acute complaints. She denies any chest pain, abdominal pain, nausea, vomiting, headaches, vision changes.  Smoking status reviewed   ROS: all other systems were reviewed and are negative other than in the HPI   Past Medical History:  Diagnosis Date  . 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 Asthma   . Complete heart block ([place])   . COPD (chronic obstructive pulmonary disease) ([place])   . Coronary artery disease   . Diabetes mellitus   . GERD (gastroesophageal reflux disease)   . Heart murmur   . Hypertension   . Hypothyroidism   . Normal coronary arteries 2011  . Pacemaker    PACEMAKER DEPENDENT-DR. [place].  OFFICE NOTE DR. A. LITTLE STATES  "EXTREMELY PACEMAKER SENSITIVE AND WHEN YOU TRY TO CHECK FOR UNDERLYING RHYTHMS SHE WILL HAVE SNYCOPE AND WE HAVE NOT DONE THIS NOW IN ABOUT 2 YEARS".  . Shortness of breath dyspnea    walking distance or climbing stairs    Past Surgical History:  Procedure Laterality Date  . ATRIOVENTRICULAR CUSHION DEFECT REPAIR  1988  . CARDIAC CATHETERIZATION  2011    SHOWED NO CAD-PER CARDIOLOGY OFFICE NOTES DR. A. LITTLE  . colonscopy      removed polyps   . INSERT / REPLACE / REMOVE PACEMAKER    . PACEMAKER INSERTION  1988   last gen 11/08- MDT  . PPM GENERATOR CHANGEOUT N/A 02/15/2017   Procedure:  PPM Generator Changeout;  Surgeon: Sanda Klein, MD;  Location: [facility];  Service: Cardiovascular;  Laterality: N/A;  . TOTAL KNEE ARTHROPLASTY Right 11/19/2012   Procedure: RIGHT TOTAL KNEE ARTHROPLASTY;  Surgeon: Gearlean Alf, MD;  Location[facility];  Service: Orthopedics;  Laterality: Right;  . TOTAL KNEE REVISION Right 09/24/2014   Procedure: RIGHT TOTAL KNEE ARTHROPLASTY REVISION;  Surgeon: Gearlean Alf, MD;  Location[facility];  Service: Orthopedics;  Laterality: Right;  . TUBAL LIGATION      Past medical history, surgical, family, and social history reviewed and updated in the EMR as appropriate.  Objective:  BP 124/60   Temp 98 F (36.7 C) (Oral)   Ht 5\' 2"  (1.575 m)   Wt 135 lb (61.2 kg)   BMI 24.69 kg/m   Vitals and nursing note reviewed  General: NAD, pleasant, able to participate in exam Cardiac: RRR, normal heart sounds, no murmurs. 2+ radial and PT pulses bilaterally Respiratory: CTAB, normal effort, No wheezes, rales or rhonchi Abdomen: soft, nontender, nondistended, no hepatic or splenomegaly, +BS Extremities: no edema or cyanosis. WWP. Skin: warm and dry, no rashes noted Neuro: alert and oriented x4, no focal deficits Psych: Normal affect and mood   Assessment & Plan:   Uncontrolled type 2 diabetes mellitus with hyperglycemia (HCC) A1c today is 11.4 still high though  slightly better than last A1c in June 12.3. Patient is still uncontrolled despite metformin and Lantus with blood glucose fasting averaging 200 and nighttime around 250. Discuss making additional changes to her regimen.  --Will increase Lantus to 35 u from 25 u --Start Humalog 5 units with 2 biggest meals  --Continue Metformin 2000 mg daily --See her again in two month for A1c check --Will bring glucometer at next visit. --Discuss signs and symptoms of hypoglycemia with patient    Marjie Skiff, MD [place] PGY-3

## 2018-07-26 LAB — BASIC METABOLIC PANEL
BUN / CREAT RATIO: 18 (ref 12–28)
BUN: 13 mg/dL (ref 8–27)
CHLORIDE: 100 mmol/L (ref 96–106)
CO2: 22 mmol/L (ref 20–29)
Calcium: 9.8 mg/dL (ref 8.7–10.3)
Creatinine, Ser: 0.72 mg/dL (ref 0.57–1.00)
GFR calc non Af Amer: 85 mL/min/{1.73_m2} (ref >59)
GFR, EST AFRICAN AMERICAN: 97 mL/min/{1.73_m2} (ref >59)
GLUCOSE: 287 mg/dL — AB (ref 65–99)
POTASSIUM: 4.2 mmol/L (ref 3.5–5.2)
SODIUM: 143 mmol/L (ref 134–144)

## 2018-08-06 ENCOUNTER — Other Ambulatory Visit: Payer: Self-pay

## 2018-08-06 MED ORDER — ROSUVASTATIN CALCIUM 20 MG PO TABS: 20.0000 mg | ORAL_TABLET | Freq: Every day | ORAL | 4 refills | Status: DC

## 2018-08-13 ENCOUNTER — Other Ambulatory Visit: Payer: Self-pay | Admitting: Cardiovascular Disease

## 2018-08-13 ENCOUNTER — Other Ambulatory Visit: Payer: Self-pay | Admitting: Family Medicine

## 2018-08-13 ENCOUNTER — Telehealth: Payer: Self-pay

## 2018-08-13 MED ORDER — APIXABAN 5 MG PO TABS: 5.0000 mg | ORAL_TABLET | Freq: Two times a day (BID) | ORAL | 0 refills | Status: DC

## 2018-08-13 NOTE — Telephone Encounter (Signed)
Patient's Eliquis has not arrived from her mail order pharmacy and she is completely out. Requests PCP to send short supply to [facility] on [address].  Call back is [phone]  Danley Danker, RN [facility] [place])

## 2018-08-13 NOTE — Telephone Encounter (Signed)
Script sent to pharmacy ok per verbal by MD.  Mercedes Barr

## 2018-08-16 ENCOUNTER — Encounter: Payer: Self-pay | Admitting: Cardiology

## 2018-08-21 ENCOUNTER — Ambulatory Visit (INDEPENDENT_AMBULATORY_CARE_PROVIDER_SITE_OTHER): Payer: Medicare Other

## 2018-08-21 DIAGNOSIS — I442 Atrioventricular block, complete: Secondary | ICD-10-CM | POA: Diagnosis not present

## 2018-08-22 NOTE — Progress Notes (Signed)
Remote pacemaker transmission.   

## 2018-08-23 ENCOUNTER — Other Ambulatory Visit: Payer: Self-pay | Admitting: *Deleted

## 2018-08-23 MED ORDER — INSULIN PEN NEEDLE 31G X 8 MM MISC: 3 refills | Status: DC

## 2018-08-23 MED ORDER — INSULIN GLARGINE 100 UNIT/ML SOLOSTAR PEN: 35.0000 [IU] | PEN_INJECTOR | Freq: Every day | SUBCUTANEOUS | 2 refills | Status: DC

## 2018-08-25 LAB — CUP PACEART REMOTE DEVICE CHECK
Battery Impedance: 136 Ohm
Battery Remaining Longevity: 143 mo
Battery Voltage: 2.78 V
Date Time Interrogation Session: 20200114123925
Implantable Lead Implant Date: 19990203
Implantable Lead Implant Date: 19990203
Implantable Lead Location: 753859
Implantable Lead Location: 753860
Implantable Lead Model: 5068
Implantable Lead Model: 5092
Implantable Pulse Generator Implant Date: 20180711
Lead Channel Impedance Value: 661 Ohm
Lead Channel Impedance Value: 67 Ohm
Lead Channel Pacing Threshold Amplitude: 0.75 V
Lead Channel Pacing Threshold Pulse Width: 0.4 ms
Lead Channel Setting Pacing Pulse Width: 0.4 ms
Lead Channel Setting Sensing Sensitivity: 4 mV
MDC IDC SET LEADCHNL RV PACING AMPLITUDE: 2.5 V
MDC IDC STAT BRADY RV PERCENT PACED: 99 %

## 2018-08-29 ENCOUNTER — Telehealth: Payer: Self-pay

## 2018-08-29 NOTE — Telephone Encounter (Signed)
Pt called nurse line stating her 72yo granddaughter somehow threw her glucose meter in the toilet. Pt will need another one sent to her pharmacy. Please advise.   [place]

## 2018-08-29 NOTE — Telephone Encounter (Signed)
Will sent another one to her pharmacy.   Thanks   Marjie Skiff, MD [place], PGY-3

## 2018-08-31 ENCOUNTER — Other Ambulatory Visit: Payer: Self-pay

## 2018-08-31 MED ORDER — ONETOUCH ULTRA 2 W/DEVICE KIT: PACK | 0 refills | Status: DC

## 2018-08-31 NOTE — Telephone Encounter (Signed)
Meter re-sent. See telephone note from 08/29/18.  Danley Danker, RN [facility] RN)

## 2018-09-03 ENCOUNTER — Telehealth: Payer: Self-pay

## 2018-09-03 MED ORDER — GLUCOSE BLOOD VI STRP: ORAL_STRIP | 2 refills | Status: DC

## 2018-09-03 NOTE — Telephone Encounter (Signed)
Patient states can't pick up new meter until next month due to cost.  Found another meter at her house, a Prodigy meter she can use if she can get strips.  Danley Danker, RN [facility] RN)

## 2018-09-12 ENCOUNTER — Other Ambulatory Visit: Payer: Self-pay

## 2018-09-12 DIAGNOSIS — E119 Type 2 diabetes mellitus without complications: Secondary | ICD-10-CM

## 2018-09-12 MED ORDER — INSULIN LISPRO (1 UNIT DIAL) 100 UNIT/ML (KWIKPEN): 5.0000 [IU] | PEN_INJECTOR | Freq: Two times a day (BID) | SUBCUTANEOUS | 2 refills | Status: DC

## 2018-09-17 ENCOUNTER — Other Ambulatory Visit: Payer: Self-pay | Admitting: *Deleted

## 2018-09-17 MED ORDER — APIXABAN 5 MG PO TABS: 5.0000 mg | ORAL_TABLET | Freq: Two times a day (BID) | ORAL | 0 refills | Status: DC

## 2018-09-17 NOTE — Telephone Encounter (Signed)
Pt has an outstanding bill @ [facility] and they will not send her more meds until she makes a payment.  She is requesting another short [facility] until she can get her money together to pay [facility]. [place], [name], CMA

## 2018-09-28 ENCOUNTER — Ambulatory Visit: Payer: Medicare Other | Admitting: Family Medicine

## 2018-10-01 ENCOUNTER — Telehealth: Payer: Self-pay

## 2018-10-01 MED ORDER — INSULIN PEN NEEDLE 31G X 5 MM MISC: 1 refills | Status: DC

## 2018-10-01 NOTE — Telephone Encounter (Signed)
Pt called nurse line stating she needs a new prescription sent in for her insulin pen needles. Pt is requesting the 5MM to be called in. Pt states the 8MM are too long. Please advise.   [place]

## 2018-10-01 NOTE — Telephone Encounter (Signed)
Thanks for putting that order in.  Marjie Skiff, MD [place], PGY-3

## 2018-10-15 ENCOUNTER — Other Ambulatory Visit: Payer: Self-pay | Admitting: *Deleted

## 2018-10-15 MED ORDER — APIXABAN 5 MG PO TABS: 5.0000 mg | ORAL_TABLET | Freq: Two times a day (BID) | ORAL | 0 refills | Status: DC

## 2018-10-19 ENCOUNTER — Other Ambulatory Visit: Payer: Self-pay | Admitting: Family Medicine

## 2018-10-19 ENCOUNTER — Telehealth: Payer: Self-pay

## 2018-10-19 ENCOUNTER — Encounter: Payer: Self-pay | Admitting: Family Medicine

## 2018-10-19 ENCOUNTER — Other Ambulatory Visit: Payer: Self-pay

## 2018-10-19 ENCOUNTER — Ambulatory Visit (INDEPENDENT_AMBULATORY_CARE_PROVIDER_SITE_OTHER): Payer: Medicare Other | Admitting: Family Medicine

## 2018-10-19 VITALS — BP 120/60 | HR 75 | Temp 97.4°F | Wt 140.0 lb

## 2018-10-19 DIAGNOSIS — E119 Type 2 diabetes mellitus without complications: Secondary | ICD-10-CM

## 2018-10-19 DIAGNOSIS — I4819 Other persistent atrial fibrillation: Secondary | ICD-10-CM

## 2018-10-19 DIAGNOSIS — E1165 Type 2 diabetes mellitus with hyperglycemia: Secondary | ICD-10-CM

## 2018-10-19 LAB — POCT GLYCOSYLATED HEMOGLOBIN (HGB A1C): HEMOGLOBIN A1C: 9.9 % — AB (ref 4.0–5.6)

## 2018-10-19 MED ORDER — RIVAROXABAN 20 MG PO TABS: 20.0000 mg | ORAL_TABLET | Freq: Every day | ORAL | 2 refills | Status: DC

## 2018-10-19 NOTE — Assessment & Plan Note (Addendum)
A1c today is 9.9 down from 11.4.  Patient is still poorly controlled, however there is some improvement.  Fasting blood glucose are still elevated.  We will increase her NovoLog from 5 units to 10 units with meals.  We will maintain her Lantus at 35 units and continue metformin.  Discussed continuing lifestyle changes regards to diet and exercise.  She will follow-up in 2 months.

## 2018-10-19 NOTE — Telephone Encounter (Signed)
Patient requests 90 day supply of Xarelto.  Call back is [phone]  Danley Danker, RN [facility] [place])

## 2018-10-19 NOTE — Patient Instructions (Addendum)
It was great seeing you today! We have addressed the following issues today   1. I want you to to increase your meal time insulin Novolog to 10 units two times a day.  2. Do not change your Lantus. 3. I want you stop your Eliquis and start Xarelto. 4. Follow up with your Cardiologist.  If we did any lab work today, and the results require attention, either me or my nurse will get in touch with you. If everything is normal, you will get a letter in mail and a message via . If you don't hear from [place] in two weeks, please give [place] a call. Otherwise, we look forward to seeing you again at your next visit. If you have any questions or concerns before then, please call the clinic at [phone].  Please bring all your medications to every doctors visit  Sign up for My Chart to have easy access to your labs results, and communication with your Primary care physician. Please ask Front Desk for some assistance.   Please check-out at the front desk before leaving the clinic.    Take Care,   Dr. Andy Gauss  \

## 2018-10-19 NOTE — Assessment & Plan Note (Signed)
Patient reports chest pain associated with Eliquis.  Patient has been taking medication chronically for atrial fibrillation.  She reports that chest pain resolved after she ran out of medication for 3 days and restarted the moment she was back on the medication.  This is a unusual side effect for this medication however after further research it can cause angina symptoms.  Will discontinue Eliquis today and start patient on Xarelto 20 mg daily.  She will follow-up with cardiology next month.  Will call her next week to reassess chest pain level.

## 2018-10-19 NOTE — Telephone Encounter (Signed)
Patient should have 30 days supply already. I called her an inform her the script was written for 30 tabs with 2 refills.  Thanks  Marjie Skiff, MD [place], PGY-3

## 2018-10-19 NOTE — Progress Notes (Signed)
Subjective:    Patient ID: Mercedes Barr, female    DOB: May 10, 1946, 73 y.o.   MRN: [id number]   CC: Follow-up for type 2 diabetes management  HPI: Patient is a 73 year old female who presents today to follow-up on type 2 diabetes management.  Patient last A1c was > 11.  She is currently doing Lantus 35 units in the morning and 5 units of NovoLog at mealtimes twice a day.  Patient reports her fasting blood glucose have been in the low 200s to upper 190s.  Patient has been trying to be more careful with his diet but has been unable to exercise due to some knee pain.  Patient has been drinking Glucerna for snack after she saw on TV that it helps bring down your blood sugar.  Patient also reports some chest pain that she thinks is associated with Eliquis.  Patient has been on Eliquis for a long time but always reports chest pressure and burning.  Patient ran out off Eliquis for 3 days and reports her chest pain completely disappeared.  She resumed it yesterday evening and chest pain was restarted.  Patient is concerned about that.  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  She has been tried on gabapentin, pregabalin, and amitriptyline but has intolerance to all of them.  She currently denies any shortness of breath, abdominal pain, dizziness, nausea, vomiting, polyuria and polydipsia.  Smoking status reviewed   ROS: all other systems were reviewed and are negative other than in the HPI   Past Medical History:  Diagnosis Date  . 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 Asthma   . Complete heart block ([place])   . COPD (chronic obstructive pulmonary disease) ([place])   . Coronary artery disease   . Diabetes mellitus   . GERD (gastroesophageal reflux disease)   . Heart murmur   . Hypertension   . Hypothyroidism   . Normal coronary arteries 2011  . Pacemaker    PACEMAKER DEPENDENT-DR. Churchill.  OFFICE NOTE DR. A. LITTLE STATES  "EXTREMELY PACEMAKER  SENSITIVE AND WHEN YOU TRY TO CHECK FOR UNDERLYING RHYTHMS SHE WILL HAVE SNYCOPE AND WE HAVE NOT DONE THIS NOW IN ABOUT 2 YEARS".  . Shortness of breath dyspnea    walking distance or climbing stairs    Past Surgical History:  Procedure Laterality Date  . ATRIOVENTRICULAR CUSHION DEFECT REPAIR  1988  . CARDIAC CATHETERIZATION  2011    SHOWED NO CAD-PER CARDIOLOGY OFFICE NOTES DR. A. LITTLE  . colonscopy      removed polyps   . INSERT / REPLACE / REMOVE PACEMAKER    . PACEMAKER INSERTION  1988   last gen 11/08- MDT  . PPM GENERATOR CHANGEOUT N/A 02/15/2017   Procedure: PPM Generator Changeout;  Surgeon: Sanda Klein, MD;  Location: [facility];  Service: Cardiovascular;  Laterality: N/A;  . TOTAL KNEE ARTHROPLASTY Right 11/19/2012   Procedure: RIGHT TOTAL KNEE ARTHROPLASTY;  Surgeon: Gearlean Alf, MD;  Location: [facility];  Service: Orthopedics;  Laterality: Right;  . TOTAL KNEE REVISION Right 09/24/2014   Procedure: RIGHT TOTAL KNEE ARTHROPLASTY REVISION;  Surgeon: Gearlean Alf, MD;  Location: [facility];  Service: Orthopedics;  Laterality: Right;  . TUBAL LIGATION      Past medical history, surgical, family, and social history reviewed and updated in the EMR as appropriate.  Objective:  BP 120/60   Pulse 75   Temp (!) 97.4  F (36.3 C) (Oral)   Wt 140 lb (63.5 kg)   SpO2 98%   BMI 25.61 kg/m   Vitals and nursing note reviewed  General: NAD, pleasant, able to participate in exam Cardiac: RRR, normal heart sounds, no murmurs. 2+ radial and PT pulses bilaterally Respiratory: CTAB, normal effort, No wheezes, rales or rhonchi Abdomen: soft, nontender, nondistended, no hepatic or splenomegaly, +BS Extremities: no edema or cyanosis. WWP. Skin: warm and dry, no rashes noted Neuro: alert and oriented x4, no focal deficits Psych: Normal affect and mood   Assessment & Plan:   DM (diabetes mellitus), type 2 A1c today is 9.9 down from 11.4.  Patient is still poorly  controlled, however there is some improvement.  Fasting blood glucose are still elevated.  We will increase her NovoLog from 5 units to 10 units with meals.  We will maintain her Lantus at 35 units and continue metformin.  Discussed continuing lifestyle changes regards to diet and exercise.  She will follow-up in 2 months.  Persistent atrial fibrillation ([place]) Patient reports chest pain associated with Eliquis.  Patient has been taking medication chronically for atrial fibrillation.  She reports that chest pain resolved after she ran out of medication for 3 days and restarted the moment she was back on the medication.  This is a unusual side effect for this medication however after further research it can cause angina symptoms.  Will discontinue Eliquis today and start patient on Xarelto 20 mg daily.  She will follow-up with cardiology next month.  Will call her next week to reassess chest pain level.    Marjie Skiff, MD [place] PGY-3

## 2018-11-20 ENCOUNTER — Ambulatory Visit (INDEPENDENT_AMBULATORY_CARE_PROVIDER_SITE_OTHER): Payer: Medicare Other | Admitting: *Deleted

## 2018-11-20 ENCOUNTER — Other Ambulatory Visit: Payer: Self-pay

## 2018-11-20 DIAGNOSIS — I442 Atrioventricular block, complete: Secondary | ICD-10-CM | POA: Diagnosis not present

## 2018-11-20 LAB — CUP PACEART REMOTE DEVICE CHECK
Battery Impedance: 160 Ohm
Battery Remaining Longevity: 137 mo
Battery Voltage: 2.78 V
Brady Statistic RV Percent Paced: 100 %
Date Time Interrogation Session: 20200414113725
Implantable Lead Implant Date: 19990203
Implantable Lead Implant Date: 19990203
Implantable Lead Location: 753859
Implantable Lead Location: 753860
Implantable Lead Model: 5068
Implantable Lead Model: 5092
Implantable Pulse Generator Implant Date: 20180711
Lead Channel Impedance Value: 644 Ohm
Lead Channel Impedance Value: 67 Ohm
Lead Channel Pacing Threshold Amplitude: 0.625 V
Lead Channel Pacing Threshold Pulse Width: 0.4 ms
Lead Channel Setting Pacing Amplitude: 2.5 V
Lead Channel Setting Pacing Pulse Width: 0.4 ms
Lead Channel Setting Sensing Sensitivity: 4 mV

## 2018-11-21 ENCOUNTER — Telehealth: Payer: Self-pay

## 2018-11-21 NOTE — Telephone Encounter (Signed)
contacted patient to convert her appointment into a virtual visit if agreed.  Patient said she did a pacer check on 11/20/18 and that she wanted to cancel her appointment for 11/28/18 because of worries of coming in contact with covid19.

## 2018-11-22 ENCOUNTER — Other Ambulatory Visit: Payer: Self-pay

## 2018-11-22 DIAGNOSIS — E119 Type 2 diabetes mellitus without complications: Secondary | ICD-10-CM

## 2018-11-22 MED ORDER — INSULIN LISPRO (1 UNIT DIAL) 100 UNIT/ML (KWIKPEN): 5.0000 [IU] | PEN_INJECTOR | Freq: Two times a day (BID) | SUBCUTANEOUS | 2 refills | Status: DC

## 2018-11-26 ENCOUNTER — Other Ambulatory Visit: Payer: Self-pay

## 2018-11-26 MED ORDER — ALBUTEROL SULFATE HFA 108 (90 BASE) MCG/ACT IN AERS: INHALATION_SPRAY | RESPIRATORY_TRACT | 1 refills | Status: DC

## 2018-11-26 MED ORDER — FLUTICASONE PROPIONATE HFA 110 MCG/ACT IN AERO: 1.0000 | INHALATION_SPRAY | Freq: Two times a day (BID) | RESPIRATORY_TRACT | 5 refills | Status: DC

## 2018-11-26 NOTE — Telephone Encounter (Signed)
Patient called nurse line stating she needs a refill on her albuterol and flonase. Pt stated she has been using these more regularly due to the increase pollen outside. Please advise.

## 2018-11-27 ENCOUNTER — Encounter: Payer: Self-pay | Admitting: Cardiology

## 2018-11-27 NOTE — Progress Notes (Signed)
Remote pacemaker transmission.   

## 2018-11-28 ENCOUNTER — Encounter: Payer: Medicare Other | Admitting: Cardiovascular Disease

## 2018-12-05 ENCOUNTER — Telehealth: Payer: Self-pay | Admitting: *Deleted

## 2018-12-05 DIAGNOSIS — E119 Type 2 diabetes mellitus without complications: Secondary | ICD-10-CM

## 2018-12-05 NOTE — Telephone Encounter (Signed)
Pt calls because the new order of Humalog she received from [facility] still says 5units BID.  She was instructed to increase to 10units BID at last appt.  She will need a new script sent in.  Will forward to MD to review and approve.  Christen Bame, CMA

## 2018-12-06 ENCOUNTER — Other Ambulatory Visit: Payer: Self-pay

## 2018-12-06 MED ORDER — INSULIN PEN NEEDLE 31G X 5 MM MISC: 1 refills | Status: DC

## 2018-12-07 DIAGNOSIS — Z96651 Presence of right artificial knee joint: Secondary | ICD-10-CM | POA: Insufficient documentation

## 2018-12-07 MED ORDER — INSULIN LISPRO (1 UNIT DIAL) 100 UNIT/ML (KWIKPEN): 10.0000 [IU] | PEN_INJECTOR | Freq: Two times a day (BID) | SUBCUTANEOUS | 0 refills | Status: DC

## 2018-12-10 ENCOUNTER — Other Ambulatory Visit: Payer: Self-pay | Admitting: Family Medicine

## 2018-12-11 DIAGNOSIS — M25561 Pain in right knee: Secondary | ICD-10-CM | POA: Diagnosis not present

## 2018-12-12 ENCOUNTER — Ambulatory Visit: Payer: Medicare Other | Admitting: Family Medicine

## 2018-12-12 NOTE — Progress Notes (Deleted)
  Subjective:   Patient ID: Mercedes Barr    DOB: 09/20/1945, 73 y.o. female   MRN: [id number]  Mercedes Barr is a 73 y.o. female with a history of HTN, HFpEF, complete heart block w/ pacemaker, DM2, hypothyroidism, OA, HLD, depression, glaucoma here for   *** - ***  Review of Systems:  Per HPI.  [place], medications and smoking status reviewed.  Objective:   There were no vitals taken for this visit. Vitals and nursing note reviewed.  General: well nourished, well developed, in no acute distress with non-toxic appearance HEENT: normocephalic, atraumatic, moist mucous membranes Neck: supple, non-tender without lymphadenopathy CV: regular rate and rhythm without murmurs, rubs, or gallops, no lower extremity edema Lungs: clear to auscultation bilaterally with normal work of breathing Abdomen: soft, non-tender, non-distended, no masses or organomegaly palpable, normoactive bowel sounds Skin: warm, dry, no rashes or lesions Extremities: warm and well perfused, normal tone MSK: ROM grossly intact, strength intact, gait normal Neuro: Alert and oriented, speech normal  Assessment & Plan:   No problem-specific Assessment & Plan notes found for this encounter.  No orders of the defined types were placed in this encounter.  No orders of the defined types were placed in this encounter.   Rory Percy, DO PGY-2, [facility] 12/12/2018 2:00 PM

## 2018-12-13 ENCOUNTER — Ambulatory Visit (INDEPENDENT_AMBULATORY_CARE_PROVIDER_SITE_OTHER): Payer: Medicare Other | Admitting: Family Medicine

## 2018-12-13 ENCOUNTER — Other Ambulatory Visit: Payer: Self-pay

## 2018-12-13 VITALS — BP 130/72 | HR 80

## 2018-12-13 DIAGNOSIS — R310 Gross hematuria: Secondary | ICD-10-CM | POA: Insufficient documentation

## 2018-12-13 HISTORY — DX: Gross hematuria: R31.0

## 2018-12-13 MED ORDER — CEPHALEXIN 500 MG PO CAPS: 500.0000 mg | ORAL_CAPSULE | Freq: Four times a day (QID) | ORAL | 0 refills | Status: AC

## 2018-12-13 NOTE — Patient Instructions (Signed)
Great to see you today! Please take the antibiotic 4 times a day for 7 days. If you continue to see blood in your urine we will need to do more testing to figure out why.  If you develop fevers, chills, nausea, vomiting or severe side pain please come back to be seen immediately.  If you have questions or concerns please do not hesitate to call at [phone].  Lucila Maine, DO PGY-3, [facility] 12/13/2018 8:45 AM   Hematuria, Adult Hematuria is blood in the urine. Blood may be visible in the urine, or it may be identified with a test. This condition can be caused by infections of the bladder, urethra, kidney, or prostate. Other possible causes include:  Kidney stones.  Cancer of the urinary tract.  Too much calcium in the urine.  Conditions that are passed from parent to child (inherited conditions).  Exercise that requires a lot of energy. Infections can usually be treated with medicine, and a kidney stone usually will pass through your urine. If neither of these is the cause of your hematuria, more tests may be needed to identify the cause of your symptoms. It is very important to tell your health care provider about any blood in your urine, even if it is painless or the blood stops without treatment. Blood in the urine, when it happens and then stops and then happens again, can be a symptom of a very serious condition, including cancer. There is no pain in the initial stages of many urinary cancers. Follow these instructions at home: Medicines  Take over-the-counter and prescription medicines only as told by your health care provider.  If you were prescribed an antibiotic medicine, take it as told by your health care provider. Do not stop taking the antibiotic even if you start to feel better. Eating and drinking  Drink enough fluid to keep your urine clear or pale yellow. It is recommended that you drink 3-4 quarts (2.8-3.8 L) a day. If you have been  diagnosed with an infection, it is recommended that you drink cranberry juice in addition to large amounts of water.  Avoid caffeine, tea, and carbonated beverages. These tend to irritate the bladder.  Avoid alcohol because it may irritate the prostate (men). General instructions  If you have been diagnosed with a kidney stone, follow your health care provider's instructions about straining your urine to catch the stone.  Empty your bladder often. Avoid holding urine for long periods of time.  If you are female: ? After a bowel movement, wipe from front to back and use each piece of toilet paper only once. ? Empty your bladder before and after sex.  Pay attention to any changes in your symptoms. Tell your health care provider about any changes or any new symptoms.  It is your responsibility to get your test results. Ask your health care provider, or the department performing the test, when your results will be ready.  Keep all follow-up visits as told by your health care provider. This is important. Contact a health care provider if:  You develop back pain.  You have a fever.  You have nausea or vomiting.  Your symptoms do not improve after 3 days.  Your symptoms get worse. Get help right away if:  You develop severe vomiting and are unable take medicine without vomiting.  You develop severe pain in your back or abdomen even though you are taking medicine.  You pass a large amount of blood in your  urine.  You pass blood clots in your urine.  You feel very weak or like you might faint.  You faint. Summary  Hematuria is blood in the urine. It has many possible causes.  It is very important that you tell your health care provider about any blood in your urine, even if it is painless or the blood stops without treatment.  Take over-the-counter and prescription medicines only as told by your health care provider.  Drink enough fluid to keep your urine clear or pale  yellow. This information is not intended to replace advice given to you by your health care provider. Make sure you discuss any questions you have with your health care provider. Document Released: 07/25/2005 Document Revised: 08/27/2016 Document Reviewed: 08/27/2016 Elsevier Interactive Patient Education  2019 [employer].

## 2018-12-13 NOTE — Progress Notes (Signed)
    Subjective:    Patient ID: Mercedes Barr, female    DOB: [birth year], 73 y.o.   MRN: [id number]   CC: hematuria  HPI: noticed blood in her urine starting on Tuesday. Has some burning with urination as well. Reports lower abdominal pain. Denies any flank pain, fevers, chills, nausea, vomiting. She reports a FH of kidney stones. She reports the week prior to noticing blood she did have some left sided pain that resolved on its own and she did not think much of it. She has never had a kidney stone before. Does not have frequent UTIs. She has T2DM. She is on xarelto for afib.  Smoking status reviewed- non-smoker  Review of Systems- see HPI   Objective:  BP 130/72   Pulse 80   SpO2 96%  Vitals and nursing note reviewed  General: well nourished, in no acute distress HEENT: normocephalic, MMM Cardiac: RRR, clear S1 and S2, no murmurs, rubs, or gallops Respiratory: clear to auscultation bilaterally, no increased work of breathing Abdomen: soft, mildly tender in suprapubic region. No guarding or rebound. No CVA tenderness. Extremities: no edema or cyanosis. Neuro: alert and oriented, no focal deficits   Assessment & Plan:   1. Gross hematuria Patient supplied small amount of bloody urine that was inadequate for microscopy, culture, or dipstick. Given dysuria will treat as if she has UTI with keflex for 7 days. Strict return precautions given for worsening, fevers, continued blood after completion of antibiotics. She has FH of kidney stones, possible she has a stone that caused some irritation or recently passed and has some blood given she is on xarelto, however if persists would do CT renal stone study and possibly refer to uro for cystoscopy. Patient verbalized understanding and agreement with plan. - POCT urinalysis dipstick   Return if symptoms worsen or fail to improve.   Lucila Maine, DO Family Medicine Resident PGY-3

## 2018-12-18 ENCOUNTER — Telehealth: Payer: Self-pay | Admitting: Family Medicine

## 2018-12-18 NOTE — Telephone Encounter (Signed)
Called Mercedes Barr to see how she was doing with UTI symptoms/blood in urine. She did not answer, I left voicemail saying I was checking in how she was doing and that I would like her to come back in to the clinic in 1-2 weeks regardless of how she is to check a urine sample. Please let her know if she calls back.

## 2018-12-27 ENCOUNTER — Other Ambulatory Visit: Payer: Self-pay | Admitting: Family Medicine

## 2018-12-27 ENCOUNTER — Telehealth: Payer: Self-pay | Admitting: *Deleted

## 2018-12-27 ENCOUNTER — Other Ambulatory Visit: Payer: Self-pay

## 2018-12-27 ENCOUNTER — Ambulatory Visit (INDEPENDENT_AMBULATORY_CARE_PROVIDER_SITE_OTHER): Payer: Medicare Other | Admitting: Family Medicine

## 2018-12-27 ENCOUNTER — Encounter: Payer: Self-pay | Admitting: Family Medicine

## 2018-12-27 VITALS — BP 110/58 | HR 85 | Ht 62.0 in | Wt 141.2 lb

## 2018-12-27 DIAGNOSIS — E119 Type 2 diabetes mellitus without complications: Secondary | ICD-10-CM

## 2018-12-27 DIAGNOSIS — Z1239 Encounter for other screening for malignant neoplasm of breast: Secondary | ICD-10-CM

## 2018-12-27 DIAGNOSIS — M810 Age-related osteoporosis without current pathological fracture: Secondary | ICD-10-CM

## 2018-12-27 DIAGNOSIS — L03012 Cellulitis of left finger: Secondary | ICD-10-CM

## 2018-12-27 DIAGNOSIS — R319 Hematuria, unspecified: Secondary | ICD-10-CM

## 2018-12-27 LAB — POCT URINALYSIS DIP (MANUAL ENTRY)
Bilirubin, UA: NEGATIVE
Blood, UA: NEGATIVE
Glucose, UA: NEGATIVE mg/dL
Ketones, POC UA: NEGATIVE mg/dL
Leukocytes, UA: NEGATIVE
Nitrite, UA: NEGATIVE
Protein Ur, POC: NEGATIVE mg/dL
Spec Grav, UA: >=1.03 — AB (ref 1.010–1.025)
Urobilinogen, UA: 0.2 E.U./dL
pH, UA: 5.5 (ref 5.0–8.0)

## 2018-12-27 LAB — POCT GLYCOSYLATED HEMOGLOBIN (HGB A1C): HbA1c, POC (controlled diabetic range): 8.9 % — AB (ref 0.0–7.0)

## 2018-12-27 MED ORDER — CEPHALEXIN 500 MG PO CAPS: 500.0000 mg | ORAL_CAPSULE | Freq: Four times a day (QID) | ORAL | 0 refills | Status: AC

## 2018-12-27 NOTE — Assessment & Plan Note (Signed)
Repeat UA with no evidence of macro or microscopic hematuria as noted at last office visit. Previous findings likely secondary to UTI. Will continue to monitor, patient will not need urology referral at this time.

## 2018-12-27 NOTE — Assessment & Plan Note (Signed)
Patient had a cut on the 4th finger of her left hand around the nailbed sustained last week. Patient sustained injury a day prior to completed Keflex therapy for UTI. Patient reported swelling, redness and tenderness initially. On exam patient is still tender with mild swelling but no fluctuance or drainage. Finding concerning for cellulitis and given diabetes will treat with Keflex for 5 days.

## 2018-12-27 NOTE — Progress Notes (Signed)
Subjective:    Patient ID: Mercedes Barr, female    DOB: 28-Apr-1946, 73 y.o.   MRN: [id number]   CC: Follow up for hematuria and T2DM management  HPI: Patient is a 73 year old female who presents today to follow-up on type 2 diabetes management. Patient was seen on 5/12 for UTI and was also noted to have some hematuria. Patient report that urinary symptom have resolved and she has not noticed any blood in her urine.For her diabetes patient is currently on Lantus 35 units and Novolog 10 units two times a day as instructed. Patient reports that her fasting have been between 140-160. She is drinking less juice but is eating more due to her being confined at home. Patient also reports urine has cleared up and she has not seen any more blood after she started taking Keflex for her UTI, she has not seen any more blood in it. Dysuria has completely resolved. Patient also reports last week she cut herself trying to open a can. He finger has been red and swollen and has been tender to the touch. She denies any drainage. Patient is otherwise denying any chest pain, SOB, abdominal pain, polyuria, polydipsia, headaches, dizziness, nausea, vomiting.  Smoking status reviewed   ROS: all other systems were reviewed and are negative other than in the HPI   Past Medical History:  Diagnosis Date  . 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 Asthma   . Complete heart block ([place])   . COPD (chronic obstructive pulmonary disease) ([place])   . Coronary artery disease   . Diabetes mellitus   . GERD (gastroesophageal reflux disease)   . Heart murmur   . Hypertension   . Hypothyroidism   . Normal coronary arteries 2011  . Pacemaker    PACEMAKER DEPENDENT-DR. [place].  OFFICE NOTE DR. A. LITTLE STATES  "EXTREMELY PACEMAKER SENSITIVE AND WHEN YOU TRY TO CHECK FOR UNDERLYING RHYTHMS SHE WILL HAVE SNYCOPE AND WE HAVE NOT DONE THIS NOW IN ABOUT 2 YEARS".  . Shortness of breath dyspnea    walking distance or climbing stairs    Past Surgical History:  Procedure Laterality Date  . ATRIOVENTRICULAR CUSHION DEFECT REPAIR  1988  . CARDIAC CATHETERIZATION  2011    SHOWED NO CAD-PER CARDIOLOGY OFFICE NOTES DR. A. LITTLE  . colonscopy      removed polyps   . INSERT / REPLACE / REMOVE PACEMAKER    . PACEMAKER INSERTION  1988   last gen 11/08- MDT  . PPM GENERATOR CHANGEOUT N/A 02/15/2017   Procedure: PPM Generator Changeout;  Surgeon: Sanda Klein, MD;  Location: [facility];  Service: Cardiovascular;  Laterality: N/A;  . TOTAL KNEE ARTHROPLASTY Right 11/19/2012   Procedure: RIGHT TOTAL KNEE ARTHROPLASTY;  Surgeon: Gearlean Alf, MD;  Location: [facility];  Service: Orthopedics;  Laterality: Right;  . TOTAL KNEE REVISION Right 09/24/2014   Procedure: RIGHT TOTAL KNEE ARTHROPLASTY REVISION;  Surgeon: Gearlean Alf, MD;  Location: [facility];  Service: Orthopedics;  Laterality: Right;  . TUBAL LIGATION      Past medical history, surgical, family, and social history reviewed and updated in the EMR as appropriate.  Objective:  BP (!) 110/58   Pulse 85   Ht 5\' 2"  (1.575 m)   Wt 141 lb 4 oz (64.1 kg)   SpO2 99%   BMI 25.83 kg/m   Vitals and nursing note reviewed  General: NAD, pleasant, able to participate in  exam Cardiac: RRR, normal heart sounds, no murmurs. 2+ radial and PT pulses bilaterally Respiratory: CTAB, normal effort, No wheezes, rales or rhonchi Abdomen: soft, nontender, nondistended, no hepatic or splenomegaly, +BS Extremities: no edema or cyanosis. WWP. Skin: warm and dry, no rashes noted Neuro: alert and oriented x4, no focal deficits Psych: Normal affect and mood   Assessment & Plan:   DM (diabetes mellitus), type 2 A1c today 8.9 down from 9.9. Patient continue to improve with current regimen. Given slightly elevated fasting will increase lantus to 40 units from 35. She will continue with Humalog 10 units bid. Continue to work on therapeutic  lifestyle changes. Foot exam performed today no evidence of peripheral neuropathy. Follow up in 3 months.   Hematuria Repeat UA with no evidence of macro or microscopic hematuria as noted at last office visit. Previous findings likely secondary to UTI. Will continue to monitor, patient will not need urology referral at this time.  Cellulitis of finger of left hand Patient had a cut on the 4th finger of her left hand around the nailbed sustained last week. Patient sustained injury a day prior to completed Keflex therapy for UTI. Patient reported swelling, redness and tenderness initially. On exam patient is still tender with mild swelling but no fluctuance or drainage. Finding concerning for cellulitis and given diabetes will treat with Keflex for 5 days.   Health maintenance: Order for mammogram and DEXA scan placed today [facility].   Marjie Skiff, MD [place] PGY-3

## 2018-12-27 NOTE — Telephone Encounter (Signed)
Patient thought that she was supposed to get a medication for toe fungus today.  Spoke with Dr. Andy Gauss, he will placed LFT's and order medicine after results come back.  Advised she will need LFT recheck in 6 weeks.  She is agreeable to plan.   She will come in Tuesday for Labs. Christen Bame, CMA

## 2018-12-27 NOTE — Assessment & Plan Note (Addendum)
A1c today 8.9 down from 9.9. Patient continue to improve with current regimen. Given slightly elevated fasting will increase lantus to 40 units from 35. She will continue with Humalog 10 units bid. Continue to work on therapeutic lifestyle changes. Foot exam performed today no evidence of peripheral neuropathy. Follow up in 3 months.

## 2018-12-27 NOTE — Patient Instructions (Addendum)
It was great seeing you today! We have addressed the following issues today  1. Increase Lantus to 40 units daily and continue with 10 units of humalo twice a day as discuss. 2. Use the antibiotic I have prescribe for 5 days you will take it 4 times a day.  3. Make sure you call the breast center to schedule your mammogram and DEXA scan. 4. I will see you in a month.  If we did any lab work today, and the results require attention, either me or my nurse will get in touch with you. If everything is normal, you will get a letter in mail and a message via . If you don't hear from [place] in two weeks, please give [place] a call. Otherwise, we look forward to seeing you again at your next visit. If you have any questions or concerns before then, please call the clinic at [phone].  Please bring all your medications to every doctors visit  Sign up for My Chart to have easy access to your labs results, and communication with your Primary care physician. Please ask Front Desk for some assistance.   Please check-out at the front desk before leaving the clinic.    Take Care,   Dr. Andy Gauss

## 2019-01-01 ENCOUNTER — Other Ambulatory Visit: Payer: Self-pay | Admitting: *Deleted

## 2019-01-01 ENCOUNTER — Other Ambulatory Visit: Payer: Medicare Other

## 2019-01-01 DIAGNOSIS — I4819 Other persistent atrial fibrillation: Secondary | ICD-10-CM

## 2019-01-01 MED ORDER — POTASSIUM CHLORIDE CRYS ER 20 MEQ PO TBCR: 20.0000 meq | EXTENDED_RELEASE_TABLET | Freq: Every day | ORAL | 1 refills | Status: DC

## 2019-01-01 MED ORDER — RIVAROXABAN 20 MG PO TABS: 20.0000 mg | ORAL_TABLET | Freq: Every day | ORAL | 2 refills | Status: DC

## 2019-01-01 NOTE — Telephone Encounter (Signed)
Also rx request for buspirone tab but not on med list. Please advise. Deseree Kennon Holter, CMA

## 2019-01-01 NOTE — Telephone Encounter (Signed)
Patient decided to not use the fungal medication at this time.  She canceled her appt with lab. Christen Bame, CMA

## 2019-01-02 ENCOUNTER — Other Ambulatory Visit: Payer: Self-pay

## 2019-01-02 NOTE — Telephone Encounter (Signed)
I agree, patient has not taken this medications for more than 3 years. Will not refill unless patient specifically ask.  Thanks  Marjie Skiff, MD [place], PGY-3

## 2019-01-03 NOTE — Telephone Encounter (Signed)
I called patient and she does want this medication. Pt wants to speak with pcp about her anxiety. Scheduled a virtual visit for next Thursday.

## 2019-01-10 ENCOUNTER — Other Ambulatory Visit: Payer: Self-pay | Admitting: Family Medicine

## 2019-01-10 ENCOUNTER — Telehealth: Payer: Medicare Other | Admitting: Family Medicine

## 2019-01-10 DIAGNOSIS — I4819 Other persistent atrial fibrillation: Secondary | ICD-10-CM

## 2019-01-31 ENCOUNTER — Other Ambulatory Visit: Payer: Self-pay

## 2019-01-31 ENCOUNTER — Encounter: Payer: Self-pay | Admitting: Family Medicine

## 2019-01-31 ENCOUNTER — Ambulatory Visit (INDEPENDENT_AMBULATORY_CARE_PROVIDER_SITE_OTHER): Payer: Medicare Other | Admitting: Family Medicine

## 2019-01-31 VITALS — BP 120/64 | HR 77 | Wt 140.4 lb

## 2019-01-31 DIAGNOSIS — E119 Type 2 diabetes mellitus without complications: Secondary | ICD-10-CM

## 2019-01-31 DIAGNOSIS — E7800 Pure hypercholesterolemia, unspecified: Secondary | ICD-10-CM | POA: Diagnosis not present

## 2019-01-31 DIAGNOSIS — E039 Hypothyroidism, unspecified: Secondary | ICD-10-CM | POA: Diagnosis not present

## 2019-01-31 DIAGNOSIS — R1013 Epigastric pain: Secondary | ICD-10-CM | POA: Diagnosis not present

## 2019-01-31 HISTORY — DX: Epigastric pain: R10.13

## 2019-01-31 NOTE — Assessment & Plan Note (Addendum)
Epigastric pain intermittent in nature.  Patient symptoms and presentation does not appear to be cardiac in etiology.  Given radiation to her back could be dealing with possible mild pancreatitis.  Patient did not have significantly abnormal lipid panel a year ago.  She does not drink alcohol and is not obese.  No weight loss or other signs concerning for possible malignancy.  We will also check a differential possible MSK etiology though no recent fall or trauma. Suspect symptoms likely secondary to possible worsening GERD in the setting of recent medication change.  Will recommend resuming omeprazole 20 mg p.o. daily.  We will also check a lipase.  Patient last TSH checked was over 3 years ago.  Will check TSH, CBC, CMP and lipid panel.  Patient will follow-up as needed if discomfort/pain continue despite new recommendations.

## 2019-01-31 NOTE — Progress Notes (Signed)
Subjective:    Patient ID: Mercedes Barr, female    DOB: 27-Sep-1945, 73 y.o.   MRN: [id number]   CC: Epigastric pain  HPI: Patient is a 73 year old female with a complex past medical history who presents today complaining of intermittent epigastric pain for the past few weeks.  Patient reports that she has pain in any of her chest on and off sometimes radiating to her back.  She denies any sweating, shortness of breath, numbness or tingling down her arm jaw or shoulder.  Patient denies any nausea or vomiting associated with the pain.  Patient continues to take all medication as prescribed.  Has had pacemaker checked on multiple occasions without any reported events.  She continues to be on her blood thinner as well.  Recently patient changed her reflux medication from twice daily to once a day.  Patient is well-appearing today with no acute complaints.  Smoking status reviewed   ROS: all other systems were reviewed and are negative other than in the HPI   Past Medical History:  Diagnosis Date  . 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 Asthma   . Complete heart block ([place])   . COPD (chronic obstructive pulmonary disease) ([place])   . Coronary artery disease   . Diabetes mellitus   . GERD (gastroesophageal reflux disease)   . Heart murmur   . Hypertension   . Hypothyroidism   . Normal coronary arteries 2011  . Pacemaker    PACEMAKER DEPENDENT-DR. [place].  OFFICE NOTE DR. A. LITTLE STATES  "EXTREMELY PACEMAKER SENSITIVE AND WHEN YOU TRY TO CHECK FOR UNDERLYING RHYTHMS SHE WILL HAVE SNYCOPE AND WE HAVE NOT DONE THIS NOW IN ABOUT 2 YEARS".  . Shortness of breath dyspnea    walking distance or climbing stairs    Past Surgical History:  Procedure Laterality Date  . ATRIOVENTRICULAR CUSHION DEFECT REPAIR  1988  . CARDIAC CATHETERIZATION  2011    SHOWED NO CAD-PER CARDIOLOGY OFFICE NOTES DR. A. LITTLE  . colonscopy      removed polyps   . INSERT / REPLACE /  REMOVE PACEMAKER    . PACEMAKER INSERTION  1988   last gen 11/08- MDT  . PPM GENERATOR CHANGEOUT N/A 02/15/2017   Procedure: PPM Generator Changeout;  Surgeon: Sanda Klein, MD;  Location: [facility];  Service: Cardiovascular;  Laterality: N/A;  . TOTAL KNEE ARTHROPLASTY Right 11/19/2012   Procedure: RIGHT TOTAL KNEE ARTHROPLASTY;  Surgeon: Gearlean Alf, MD;  Location: [facility];  Service: Orthopedics;  Laterality: Right;  . TOTAL KNEE REVISION Right 09/24/2014   Procedure: RIGHT TOTAL KNEE ARTHROPLASTY REVISION;  Surgeon: Gearlean Alf, MD;  Location: [facility];  Service: Orthopedics;  Laterality: Right;  . TUBAL LIGATION      Past medical history, surgical, family, and social history reviewed and updated in the EMR as appropriate.  Objective:  BP 120/64   Pulse 77   Wt 140 lb 6 oz (63.7 kg)   SpO2 97%   BMI 25.67 kg/m   Vitals and nursing note reviewed  General: NAD, pleasant, able to participate in exam Cardiac: RRR, normal heart sounds, no murmurs. 2+ radial and PT pulses bilaterally Respiratory: CTAB, normal effort, No wheezes, rales or rhonchi Abdomen: soft, nontender, nondistended, no hepatic or splenomegaly, +BS Extremities: no edema or cyanosis. WWP. Skin: warm and dry, no rashes noted Neuro: alert and oriented x4, no focal deficits Psych: Normal affect and mood  Assessment & Plan:   Epigastric pain Epigastric pain intermittent in nature.  Patient symptoms and presentation does not appear to be cardiac in etiology.  Given radiation to her back could be dealing with possible mild pancreatitis.  Patient did not have significantly abnormal lipid panel a year ago.  She does not drink alcohol and is not obese.  No weight loss or other signs concerning for possible malignancy.  We will also check a differential possible MSK etiology though no recent fall or trauma. Suspect symptoms likely secondary to possible worsening GERD in the setting of recent medication change.   Will recommend resuming omeprazole 20 mg p.o. daily.  We will also check a lipase.  Patient last TSH checked was over 3 years ago.  Will check TSH, CBC, CMP and lipid panel.  Patient will follow-up as needed if discomfort/pain continue despite new recommendations.    Marjie Skiff, MD [facility] PGY-3

## 2019-01-31 NOTE — Patient Instructions (Signed)
It was great seeing you today! We have addressed the following issues today  1. I want you to take your omeprazole as before 20 mg in the morning and 20 mg in the evening.  2. We will do some blood work today and I will follow up on the results. If there is an abnormal results I will call you and we can discuss.   If we did any lab work today, and the results require attention, either me or my nurse will get in touch with you. If everything is normal, you will get a letter in mail and a message via . If you don't hear from [place] in two weeks, please give [place] a call. Otherwise, we look forward to seeing you again at your next visit. If you have any questions or concerns before then, please call the clinic at [phone].  Please bring all your medications to every doctors visit  Sign up for My Chart to have easy access to your labs results, and communication with your Primary care physician. Please ask Front Desk for some assistance.   Please check-out at the front desk before leaving the clinic.    Take Care,   Dr. Andy Gauss

## 2019-02-01 ENCOUNTER — Other Ambulatory Visit: Payer: Self-pay | Admitting: *Deleted

## 2019-02-01 DIAGNOSIS — E119 Type 2 diabetes mellitus without complications: Secondary | ICD-10-CM

## 2019-02-01 DIAGNOSIS — I4819 Other persistent atrial fibrillation: Secondary | ICD-10-CM

## 2019-02-01 LAB — CBC WITH DIFFERENTIAL/PLATELET
Basophils Absolute: 0 10*3/uL (ref 0.0–0.2)
Basos: 1 %
EOS (ABSOLUTE): 0.1 10*3/uL (ref 0.0–0.4)
Eos: 2 %
Hematocrit: 44.4 % (ref 34.0–46.6)
Hemoglobin: 13.9 g/dL (ref 11.1–15.9)
Immature Grans (Abs): 0 10*3/uL (ref 0.0–0.1)
Immature Granulocytes: 0 %
Lymphocytes Absolute: 2.5 10*3/uL (ref 0.7–3.1)
Lymphs: 39 %
MCH: 26.1 pg — ABNORMAL LOW (ref 26.6–33.0)
MCHC: 31.3 g/dL — ABNORMAL LOW (ref 31.5–35.7)
MCV: 83 fL (ref 79–97)
Monocytes Absolute: 0.4 10*3/uL (ref 0.1–0.9)
Monocytes: 6 %
Neutrophils Absolute: 3.4 10*3/uL (ref 1.4–7.0)
Neutrophils: 52 %
Platelets: 195 10*3/uL (ref 150–450)
RBC: 5.33 x10E6/uL — ABNORMAL HIGH (ref 3.77–5.28)
RDW: 12.6 % (ref 11.7–15.4)
WBC: 6.5 10*3/uL (ref 3.4–10.8)

## 2019-02-01 LAB — LIPASE: Lipase: 61 U/L (ref 14–85)

## 2019-02-01 LAB — COMPREHENSIVE METABOLIC PANEL
ALT: 10 IU/L (ref 0–32)
AST: 13 IU/L (ref 0–40)
Albumin/Globulin Ratio: 1.6 (ref 1.2–2.2)
Albumin: 4.5 g/dL (ref 3.7–4.7)
Alkaline Phosphatase: 59 IU/L (ref 39–117)
BUN/Creatinine Ratio: 20 (ref 12–28)
BUN: 18 mg/dL (ref 8–27)
Bilirubin Total: 0.3 mg/dL (ref 0.0–1.2)
CO2: 22 mmol/L (ref 20–29)
Calcium: 9.9 mg/dL (ref 8.7–10.3)
Chloride: 100 mmol/L (ref 96–106)
Creatinine, Ser: 0.89 mg/dL (ref 0.57–1.00)
GFR calc Af Amer: 75 mL/min/{1.73_m2} (ref >59)
GFR calc non Af Amer: 65 mL/min/{1.73_m2} (ref >59)
Globulin, Total: 2.8 g/dL (ref 1.5–4.5)
Glucose: 275 mg/dL — ABNORMAL HIGH (ref 65–99)
Potassium: 4.8 mmol/L (ref 3.5–5.2)
Sodium: 143 mmol/L (ref 134–144)
Total Protein: 7.3 g/dL (ref 6.0–8.5)

## 2019-02-01 LAB — LIPID PANEL
Chol/HDL Ratio: 3.2 ratio (ref 0.0–4.4)
Cholesterol, Total: 105 mg/dL (ref 100–199)
HDL: 33 mg/dL — ABNORMAL LOW (ref >39)
LDL Calculated: 42 mg/dL (ref 0–99)
Triglycerides: 150 mg/dL — ABNORMAL HIGH (ref 0–149)
VLDL Cholesterol Cal: 30 mg/dL (ref 5–40)

## 2019-02-01 LAB — TSH: TSH: 4.93 u[IU]/mL — ABNORMAL HIGH (ref 0.450–4.500)

## 2019-02-04 ENCOUNTER — Telehealth: Payer: Self-pay | Admitting: *Deleted

## 2019-02-04 MED ORDER — INSULIN LISPRO (1 UNIT DIAL) 100 UNIT/ML (KWIKPEN): 10.0000 [IU] | PEN_INJECTOR | Freq: Two times a day (BID) | SUBCUTANEOUS | 6 refills | Status: DC

## 2019-02-04 MED ORDER — LANTUS SOLOSTAR 100 UNIT/ML ~~LOC~~ SOPN: 35.0000 [IU] | PEN_INJECTOR | Freq: Every day | SUBCUTANEOUS | 2 refills | Status: DC

## 2019-02-04 MED ORDER — METFORMIN HCL 500 MG PO TABS: ORAL_TABLET | ORAL | 4 refills | Status: DC

## 2019-02-04 MED ORDER — CITALOPRAM HYDROBROMIDE 20 MG PO TABS: 20.0000 mg | ORAL_TABLET | Freq: Every morning | ORAL | 3 refills | Status: DC

## 2019-02-04 MED ORDER — PROPRANOLOL HCL 40 MG PO TABS: 40.0000 mg | ORAL_TABLET | Freq: Every day | ORAL | 1 refills | Status: DC

## 2019-02-04 MED ORDER — RIVAROXABAN 20 MG PO TABS: ORAL_TABLET | ORAL | 0 refills | Status: DC

## 2019-02-04 NOTE — Telephone Encounter (Signed)
Pt states that she is still having the same pain as Thursday. Would like to know what her labs showed.  Denies cardiac symptoms still. Christen Bame, CMA

## 2019-02-04 NOTE — Telephone Encounter (Signed)
Called and talk to patient about her test results. Patient is also still complaining of chest pain and I highly recommended that she goes to the ED for further evaluation. Patient does not have transportation but will call EMS if symptoms continue. I will call patient tomorrow to follow up.  Marjie Skiff, MD [place], PGY-3

## 2019-02-05 ENCOUNTER — Ambulatory Visit: Payer: Medicare Other

## 2019-02-05 ENCOUNTER — Other Ambulatory Visit: Payer: Self-pay

## 2019-02-11 ENCOUNTER — Other Ambulatory Visit: Payer: Self-pay

## 2019-02-11 ENCOUNTER — Ambulatory Visit (INDEPENDENT_AMBULATORY_CARE_PROVIDER_SITE_OTHER): Payer: Medicare Other

## 2019-02-11 VITALS — Ht 62.0 in | Wt 139.0 lb

## 2019-02-11 DIAGNOSIS — Z0000 Encounter for general adult medical examination without abnormal findings: Secondary | ICD-10-CM | POA: Diagnosis not present

## 2019-02-11 NOTE — Patient Instructions (Signed)
You spoke to Mercedes Barr, Mercedes Barr over the phone for your annual wellness visit.  We discussed goals: Goals    . Blood Pressure < 140/90    . DIET - REDUCE SUGAR INTAKE (pt-stated)     Limit carbs.     . Exercise 120 minutes per week (moderate activity)          . HEMOGLOBIN A1C < 8    . Weight < 128 lb (58.06 kg)     7 %   Weight loss       We also discussed recommended health maintenance. Please call our office and schedule a visit. As discussed, you are due for your annual flu shot in August and can receive Tdap as well. Keep your apt for your mammogram and dexa scan for August. We will put a referral in for your colonoscopy at next office visit.   Health Maintenance  Topic Date Due  . TETANUS/TDAP  01/07/2012  . DEXA SCAN  04/10/2012  . MAMMOGRAM  06/29/2018  . OPHTHALMOLOGY EXAM  12/01/2018  . INFLUENZA VACCINE  03/09/2019  . HEMOGLOBIN A1C  06/29/2019  . FOOT EXAM  12/27/2019  . COLONOSCOPY  10/17/2020  . Hepatitis C Screening  Completed  . PNA vac Low Risk Adult  Completed   Schedule an apt for August to meet your new PCP and diabetes follow up.   We also discussed limiting carb intake. Please review the literature I have provided for you. Try to strive for 3 (40 min) walks per week. This will help lower your A1C.  Don't forget to fill out the advanced directive packet and bring with you to next office visit. We have a notary here.  Take Care!   Diet Recommendations for Diabetes   1. Eat at least 3 meals and 1-2 snacks per day. Never go more than 4-5 hours while awake without eating. Eat breakfast within the first hour of getting up.   2. Limit starchy foods to TWO per meal and ONE per snack. ONE portion of a starchy  food is equal to the following:   - ONE slice of bread (or its equivalent, such as half of a hamburger bun).   - 1/2 cup of a "scoopable" starchy food such as potatoes or rice.   - 15 grams of Total Carbohydrate as shown on food label.  3. Include  at every meal: a protein food, a carb food, and vegetables and/or fruit.   - Obtain twice the volume of vegetables as protein or carbohydrate foods for both lunch and dinner.   - Fresh or frozen vegetables are best.   - Keep frozen vegetables on hand for a quick vegetable serving.       Starchy (carb) foods: Bread, rice, pasta, potatoes, corn, cereal, grits, crackers, bagels, muffins, all baked goods.  (Fruits, milk, and yogurt also have carbohydrate, but most of these foods will not spike your blood sugar as most starchy foods will.)  A few fruits do cause high blood sugars; use small portions of bananas (limit to 1/2 at a time), grapes, watermelon, oranges, and most tropical fruits.    Protein foods: Meat, fish, poultry, eggs, dairy foods, and beans such as pinto and kidney beans (beans also provide carbohydrate).   Here is an example of what a healthy plate looks like:    ? Make half your plate fruits and vegetables.     ? Focus on whole fruits.     ? Vary your veggies.  ?  Make half your grains whole grains. -     ? Look for the word "whole" at the beginning of the ingredients list    ? Some whole-grain ingredients include whole oats, whole-wheat flour,        whole-grain corn, whole-grain brown rice, and whole rye.  ? Move to low-fat and fat-free milk or yogurt.  ? Vary your protein routine. - Meat, fish, poultry (chicken, [place]), eggs, beans (kidney, pinto), dairy.  ? Drink and eat less sodium, saturated fat, and added sugars.       Our clinic's number is [phone]. Please call with questions or concerns about what we discussed today.

## 2019-02-11 NOTE — Progress Notes (Addendum)
Subjective:   Mercedes Barr is a 73 y.o. female who presents for Medicare Annual (Subsequent) preventive examination.  The patient consented to a virtual visit.  Review of Systems: Defer to PCP  Cardiac Risk Factors include: diabetes mellitus;advanced age (>31mn, >>54women);hypertension     Objective:     Vitals: Ht _0  (1.575 m)   Wt 139 lb (63 kg)   BMI 25.42 kg/m   Body mass index is 25.42 kg/m.  Advanced Directives 02/11/2019 01/31/2019 01/31/2019 12/27/2018 12/13/2018 10/19/2018 07/25/2018  Does Patient Have a Medical Advance Directive? _1  No No  Does patient want to make changes to medical advance directive? - - - - - - -  Would patient like information on creating a medical advance directive? Yes (MAU/Ambulatory/Procedural Areas - Information given) No - Patient declined No - Patient declined No - Patient declined No - Patient declined No - Patient declined No - Patient declined    Tobacco Social History   Tobacco Use  Smoking Status Never Smoker  Smokeless Tobacco Never Used     Counseling given: No   Clinical Intake:  Pre-visit preparation completed: Yes  Pain : 0-10 Pain Score: 0-No pain   How often do you need to have someone help you when you read instructions, pamphlets, or other written materials from your doctor or pharmacy?: 2 - Rarely What is the last grade level you completed in school?: high school  Interpreter Needed?: No   Past Medical History:  Diagnosis Date  . 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 Asthma   . Complete heart block ([place]   . COPD (chronic obstructive pulmonary disease) ([place]   . Coronary artery disease   . Diabetes mellitus   . GERD (gastroesophageal reflux disease)   . Heart murmur   . Hypertension   . Hypothyroidism   . Normal coronary arteries 2011  . Pacemaker    PACEMAKER DEPENDENT-DR. [place]  OFFICE NOTE DR. A. LITTLE STATES  "EXTREMELY PACEMAKER SENSITIVE AND WHEN  YOU TRY TO CHECK FOR UNDERLYING RHYTHMS SHE WILL HAVE SNYCOPE AND WE HAVE NOT DONE THIS NOW IN ABOUT 2 YEARS".  . Shortness of breath dyspnea    walking distance or climbing stairs   Past Surgical History:  Procedure Laterality Date  . ATRIOVENTRICULAR CUSHION DEFECT REPAIR  1988  . CARDIAC CATHETERIZATION  2011    SHOWED NO CAD-PER CARDIOLOGY OFFICE NOTES DR. A. LITTLE  . colonscopy      removed polyps   . INSERT / REPLACE / REMOVE PACEMAKER    . PACEMAKER INSERTION  1988   last gen 11/08- MDT  . PPM GENERATOR CHANGEOUT N/A 02/15/2017   Procedure: PPM Generator Changeout;  Surgeon: CSanda Klein MD;  [facility];  Service: Cardiovascular;  Laterality: N/A;  . TOTAL KNEE ARTHROPLASTY Right 11/19/2012   Procedure: RIGHT TOTAL KNEE ARTHROPLASTY;  Surgeon: FGearlean Alf MD;  [facility];  Service: Orthopedics;  Laterality: Right;  . TOTAL KNEE REVISION Right 09/24/2014   Procedure: RIGHT TOTAL KNEE ARTHROPLASTY REVISION;  Surgeon: FGearlean Alf MD;  [facility];  Service: Orthopedics;  Laterality: Right;  . TUBAL LIGATION     Family History  Problem Relation Age of Onset  . Heart failure Mother   . Hypertension Mother   . Stroke Father   . Cancer Brother   . Kidney disease Daughter   . Heart failure Sister  Social History   Socioeconomic History  . Marital status: Divorced    Spouse name: Not on file  . Number of children: 3  . Years of education: 55  . Highest education level: High school graduate  Occupational History  . 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 Financial resource strain: Not hard at all  . Food insecurity    Worry: Never true    Inability: Never true  . Transportation needs    Medical: No    Non-medical: No  Tobacco Use  . Smoking status: Never Smoker  . Smokeless tobacco: Never Used  Substance and Sexual Activity  . Alcohol use: No  . Drug use: No  . Sexual activity: Not Currently   Lifestyle  . Physical activity    Days per week: 2 days    Minutes per session: 40 min  . Stress: To some extent  Relationships  . Social connections    Talks on phone: More than three times a week    Gets together: Twice a week    Attends religious service: More than 4 times per year    Active member of club or organization: No    Attends meetings of clubs or organizations: Never    Relationship status: Divorced  Other Topics Concern  . Not on file  Social History Narrative   Patient lives with her daughter Helene Kelp and daughters husband.    Patient enjoys walking around her neighborhood with her 2 neighbors.    Patient does not drive, however she takes SCAT to all her medical apts.     Outpatient Encounter Medications as of 02/11/2019  Medication Sig  . acetaminophen (TYLENOL) 325 MG tablet Take 650 mg by mouth every 6 (six) hours as needed for moderate pain (Right knee pain).  Marland Kitchen albuterol (PROAIR HFA) 108 (90 Base) MCG/ACT inhaler USE 2 PUFFS EVERY 4 HOURS  AS NEEDED FOR WHEEZING  . albuterol (PROVENTIL) (2.5 MG/3ML) 0.083% nebulizer solution USE 1 VIAL VIA NEBULIZER  EVERY 4 HOURS AS NEEDED FOR WHEEZING  . Blood Glucose Monitoring Suppl (ONE TOUCH ULTRA 2) w/Device KIT Use to check blood sugar as directed.  . citalopram (CELEXA) 20 MG tablet Take 1 tablet (20 mg total) by mouth every morning.  Marland Kitchen EPIPEN 2-PAK 0.3 MG/0.3ML SOAJ injection Inject 0.3 mLs (0.3 mg  total) into the muscle as  needed for severe allergic  reaction, and contact  medical provider.  . fluticasone (FLOVENT HFA) 110 MCG/ACT inhaler Inhale 1 puff into the lungs 2 (two) times daily.  . furosemide (LASIX) 20 MG tablet TAKE 1 TABLET BY MOUTH  EVERY MORNING  . glucose blood (ONE TOUCH ULTRA TEST) test strip Use for blood sugar testing 1-3 times daily  . glucose blood (PRODIGY NO CODING BLOOD GLUC) test strip Use three times a day to check on blood glucose level  . Insulin Glargine (LANTUS SOLOSTAR) 100 UNIT/ML  Solostar Pen Inject 35 Units into the skin daily.  . insulin lispro (HUMALOG) 100 UNIT/ML KwikPen Inject 0.1 mLs (10 Units total) into the skin 2 (two) times daily.  . Insulin Pen Needle (B-D UF III MINI PEN NEEDLES) 31G X 5 MM MISC USE DAILY FOR LONG ACTING INSULIN  . Insulin Pen Needle (B-D ULTRAFINE III SHORT PEN) 31G X 8 MM MISC INJECT 10 UNITS INTO THE SKIN DAILY.  Marland Kitchen Lancets MISC Use with blood sugar monitor.  . levothyroxine (SYNTHROID, LEVOTHROID) 75 MCG tablet TAKE 1 TABLET  BY MOUTH  DAILY BEFORE BREAKFAST  . losartan (COZAAR) 25 MG tablet TAKE 1 TABLET BY MOUTH  DAILY  . metFORMIN (GLUCOPHAGE) 500 MG tablet TAKE 2 TABLETS BY MOUTH TWO TIMES DAILY WITH MEALS  . omeprazole (PRILOSEC) 20 MG capsule TAKE 1 CAPSULE BY MOUTH TWO TIMES DAILY  . polyethylene glycol powder (GLYCOLAX/MIRALAX) powder TAKE 17 GRAMS BY MOUTH 2  TIMES DAILY AS NEEDED FOR  LAXATIVE  . potassium chloride SA (K-DUR) 20 MEQ tablet Take 1 tablet (20 mEq total) by mouth daily.  . propranolol (INDERAL) 40 MG tablet Take 1 tablet (40 mg total) by mouth daily.  . rivaroxaban (XARELTO) 20 MG TABS tablet TAKE 1 TABLET (20 MG TOTAL) BY MOUTH DAILY WITH SUPPER.  . rosuvastatin (CRESTOR) 20 MG tablet Take 1 tablet (20 mg total) by mouth daily.  Marland Kitchen Spacer/Aero-Holding Chambers (AEROCHAMBER PLUS) inhaler Use as instructed  . Travoprost, BAK Free, (TRAVATAN) 0.004 % SOLN ophthalmic solution Place 1 drop into both eyes at bedtime.  . traZODone (DESYREL) 50 MG tablet TAKE 1 TABLET BY MOUTH AT  BEDTIME AS NEEDED FOR SLEEP  . methocarbamol (ROBAXIN) 500 MG tablet Take 1 tablet (500 mg total) by mouth every 6 (six) hours as needed for muscle spasms. (Patient not taking: Reported on 02/11/2019)  . Omega-3 Fatty Acids (FISH OIL) 1000 MG CAPS Take 1 capsule by mouth every morning. Reported on 02/11/2016  . [DISCONTINUED] albuterol (PROVENTIL,VENTOLIN) 90 MCG/ACT inhaler Inhale 2 puffs into the lungs every 4 (four) hours as needed for wheezing or  shortness of breath.  . [DISCONTINUED] apixaban (ELIQUIS) 5 MG TABS tablet Take 1 tablet (5 mg total) by mouth 2 (two) times daily. (Patient not taking: Reported on 02/11/2019)  . [DISCONTINUED] azithromycin (ZITHROMAX) 250 MG tablet Take 1 tablet (250 mg total) by mouth daily. 2 tabs po on day 1, 1 tab po on days 2-5   No facility-administered encounter medications on file as of 02/11/2019.     Activities of Daily Living In your present state of health, do you have any difficulty performing the following activities: 02/11/2019  Hearing? N  Vision? Y  Comment reading glases  Difficulty concentrating or making decisions? N  Walking or climbing stairs? Y  Comment knee problems  Dressing or bathing? N  Doing errands, shopping? N  Preparing Food and eating ? N  Using the Toilet? N  In the past six months, have you accidently leaked urine? N  Do you have problems with loss of bowel control? N  Managing your Medications? N  Managing your Finances? N  Housekeeping or managing your Housekeeping? N  Some recent data might be hidden    Patient Care Team: Matilde Haymaker, MD as PCP - General (Family Medicine) Croitoru, Dani Gobble, MD as PCP - Cardiology (Cardiology) Lovena Neighbours, DMD (Dentistry) Rutherford Guys, MD as Consulting Physician (Ophthalmology) Sanda Klein, MD as Consulting Physician (Cardiology)    Assessment:   This is a routine wellness examination for Mercedes Barr.  Exercise Activities and Dietary recommendations Current Exercise Habits: The patient does not participate in regular exercise at present, Exercise limited by: orthopedic condition(s)  Goals    . Blood Pressure < 140/90    . DIET - REDUCE SUGAR INTAKE (pt-stated)     Limit carbs.     . Exercise 120 minutes per week (moderate activity)          . HEMOGLOBIN A1C < 8    . Weight < 128 lb (58.06 kg)     7 %  Weight loss       Fall Risk Fall Risk  02/11/2019 01/31/2019 12/27/2018 12/13/2018 10/19/2018  Falls in the past  year? 0 0 0 0 0  Number falls in past yr: - - - - -  Injury with Fall? - - - - -  Risk for fall due to : - - - - -  Risk for fall due to: Comment - - - - -  Follow up - - - - -   Is the patient's home free of loose throw rugs in walkways, pet beds, electrical cords, etc?   yes      Grab bars in the bathroom? yes      Handrails on the stairs?   no      Adequate lighting?   yes  Patient rating of health (0-10) scale: 5   Depression Screen PHQ 2/9 Scores 02/11/2019 01/31/2019 12/27/2018 12/13/2018  PHQ - 2 Score 0 0 0 0  PHQ- 9 Score - - - -     Cognitive Function     6CIT Screen 02/11/2019  What Year? 0 points  What month? 0 points  What time? 0 points  Count back from 20 0 points  Months in reverse 0 points  Repeat phrase 0 points  Total Score 0    Immunization History  Administered Date(s) Administered  . Influenza Split 09/07/2011, 05/10/2012  . Influenza Whole 06/01/2007, 05/28/2008, 07/07/2009, 07/15/2010  . Influenza,inj,Quad PF,6+ Mos 10/21/2013, 08/18/2014, 04/27/2015, 06/10/2016, 07/25/2018  . Influenza-Unspecified 05/08/2017  . Pneumococcal Conjugate-13 05/21/2015  . Pneumococcal Polysaccharide-23 06/01/2007, 06/08/2012  . Td 01/06/2002     Screening Tests Health Maintenance  Topic Date Due  . TETANUS/TDAP  01/07/2012  . DEXA SCAN  04/10/2012  . MAMMOGRAM  06/29/2018  . OPHTHALMOLOGY EXAM  12/01/2018  . INFLUENZA VACCINE  03/09/2019  . HEMOGLOBIN A1C  06/29/2019  . FOOT EXAM  12/27/2019  . COLONOSCOPY  10/17/2020  . Hepatitis C Screening  Completed  . PNA vac Low Risk Adult  Completed    Cancer Screenings: Lung: Low Dose CT Chest recommended if Age 44-80 years, 30 pack-year currently smoking OR have quit w/in 15years. Patient does not qualify. Breast:  Up to date on Mammogram? Yes   Up to date of Bone Density/Dexa? Yes Colorectal: 2012- Per MD note pt to return in 5 years. Patient is due. A referral needs to be made.   Additional Screenings:  Hepatitis C Screening: 2017     Plan:  I have mailed you an Advanced Directive packet. Please look over this with your daughter and bring completed packet back to our office.   I have personally reviewed and noted the following in the patient's chart:   . Medical and social history . Use of alcohol, tobacco or illicit drugs  . Current medications and supplements . Functional ability and status . Nutritional status . Physical activity . Advanced directives . List of other physicians . Hospitalizations, surgeries, and ER visits in previous 12 months . Vitals . Screenings to include cognitive, depression, and falls . Referrals and appointments  In addition, I have reviewed and discussed with patient certain preventive protocols, quality metrics, and best practice recommendations. A written personalized care plan for preventive services as well as general preventive health recommendations were provided to patient.    This visit was conducted virtually in the setting of the [place] pandemic.    Dorna Bloom, [place]  02/11/2019    I have reviewed this visit and agree  with the documentation.  Matilde Haymaker, MD

## 2019-02-19 ENCOUNTER — Ambulatory Visit (INDEPENDENT_AMBULATORY_CARE_PROVIDER_SITE_OTHER): Payer: Medicare Other | Admitting: *Deleted

## 2019-02-19 DIAGNOSIS — I442 Atrioventricular block, complete: Secondary | ICD-10-CM

## 2019-02-19 LAB — CUP PACEART REMOTE DEVICE CHECK
Battery Impedance: 184 Ohm
Battery Remaining Longevity: 132 mo
Battery Voltage: 2.78 V
Brady Statistic RV Percent Paced: 100 %
Date Time Interrogation Session: 20200714093257
Implantable Lead Implant Date: 19990203
Implantable Lead Implant Date: 19990203
Implantable Lead Location: 753859
Implantable Lead Location: 753860
Implantable Lead Model: 5068
Implantable Lead Model: 5092
Implantable Pulse Generator Implant Date: 20180711
Lead Channel Impedance Value: 644 Ohm
Lead Channel Impedance Value: 67 Ohm
Lead Channel Pacing Threshold Amplitude: 0.5 V
Lead Channel Pacing Threshold Pulse Width: 0.4 ms
Lead Channel Setting Pacing Amplitude: 2.5 V
Lead Channel Setting Pacing Pulse Width: 0.4 ms
Lead Channel Setting Sensing Sensitivity: 4 mV

## 2019-03-01 ENCOUNTER — Other Ambulatory Visit: Payer: Self-pay | Admitting: *Deleted

## 2019-03-01 NOTE — Telephone Encounter (Signed)
Opened in error.  Mercedes Barr, CMA

## 2019-03-05 NOTE — Progress Notes (Addendum)
Remote pacemaker transmission.   

## 2019-03-20 ENCOUNTER — Other Ambulatory Visit: Payer: Self-pay | Admitting: Family Medicine

## 2019-03-20 DIAGNOSIS — Z13820 Encounter for screening for osteoporosis: Secondary | ICD-10-CM

## 2019-03-22 ENCOUNTER — Other Ambulatory Visit: Payer: Self-pay

## 2019-03-22 ENCOUNTER — Ambulatory Visit
Admission: RE | Admit: 2019-03-22 | Discharge: 2019-03-22 | Disposition: A | Discharge status: Home / Self Care | Payer: Medicare Other | Source: Ambulatory Visit | Attending: Family Medicine | Admitting: Family Medicine

## 2019-03-22 DIAGNOSIS — Z1239 Encounter for other screening for malignant neoplasm of breast: Secondary | ICD-10-CM

## 2019-03-22 DIAGNOSIS — M810 Age-related osteoporosis without current pathological fracture: Secondary | ICD-10-CM | POA: Diagnosis not present

## 2019-03-22 DIAGNOSIS — M85832 Other specified disorders of bone density and structure, left forearm: Secondary | ICD-10-CM | POA: Diagnosis not present

## 2019-03-22 DIAGNOSIS — Z13820 Encounter for screening for osteoporosis: Secondary | ICD-10-CM

## 2019-03-22 DIAGNOSIS — Z1231 Encounter for screening mammogram for malignant neoplasm of breast: Secondary | ICD-10-CM | POA: Diagnosis not present

## 2019-03-22 DIAGNOSIS — Z780 Asymptomatic menopausal state: Secondary | ICD-10-CM | POA: Diagnosis not present

## 2019-03-25 ENCOUNTER — Other Ambulatory Visit: Payer: Self-pay

## 2019-03-27 MED ORDER — LANTUS SOLOSTAR 100 UNIT/ML ~~LOC~~ SOPN: 40.0000 [IU] | PEN_INJECTOR | Freq: Every day | SUBCUTANEOUS | 2 refills | Status: DC

## 2019-04-03 ENCOUNTER — Encounter: Payer: Self-pay | Admitting: Family Medicine

## 2019-04-03 DIAGNOSIS — M810 Age-related osteoporosis without current pathological fracture: Secondary | ICD-10-CM | POA: Insufficient documentation

## 2019-04-04 ENCOUNTER — Other Ambulatory Visit: Payer: Self-pay

## 2019-04-04 DIAGNOSIS — I4819 Other persistent atrial fibrillation: Secondary | ICD-10-CM

## 2019-04-05 MED ORDER — RIVAROXABAN 20 MG PO TABS: ORAL_TABLET | ORAL | 0 refills | Status: DC

## 2019-04-22 ENCOUNTER — Telehealth: Payer: Self-pay

## 2019-04-22 NOTE — Telephone Encounter (Signed)
Pt was concerned about a recall of metformin due to increase chemical that would lead to cancer.  This was in the metformin XR 500 and 750.  [name] is not taking either of these. She was informed that it is safe to continue taking her metformin.   Mercedes Haymaker, MD

## 2019-04-22 NOTE — Telephone Encounter (Signed)
Pt called nurse line. Very concerned about taking Metformin. She would like to talk to Dr. Pilar Plate. Pt worried about the chance of getting cancer from taking this medication. Ottis Stain, CMA

## 2019-04-26 ENCOUNTER — Other Ambulatory Visit: Payer: Self-pay | Admitting: *Deleted

## 2019-04-26 MED ORDER — OMEPRAZOLE 20 MG PO CPDR: DELAYED_RELEASE_CAPSULE | ORAL | 3 refills | Status: DC

## 2019-05-02 ENCOUNTER — Other Ambulatory Visit: Payer: Self-pay | Admitting: *Deleted

## 2019-05-02 ENCOUNTER — Other Ambulatory Visit: Payer: Self-pay | Admitting: Family Medicine

## 2019-05-02 DIAGNOSIS — I10 Essential (primary) hypertension: Secondary | ICD-10-CM

## 2019-05-02 DIAGNOSIS — I5030 Unspecified diastolic (congestive) heart failure: Secondary | ICD-10-CM

## 2019-05-02 DIAGNOSIS — I4819 Other persistent atrial fibrillation: Secondary | ICD-10-CM

## 2019-05-02 DIAGNOSIS — K219 Gastro-esophageal reflux disease without esophagitis: Secondary | ICD-10-CM

## 2019-05-02 MED ORDER — POTASSIUM CHLORIDE CRYS ER 20 MEQ PO TBCR: 20.0000 meq | EXTENDED_RELEASE_TABLET | Freq: Every day | ORAL | 1 refills | Status: DC

## 2019-05-02 MED ORDER — OMEPRAZOLE 20 MG PO CPDR: DELAYED_RELEASE_CAPSULE | ORAL | 3 refills | Status: DC

## 2019-05-02 MED ORDER — RIVAROXABAN 20 MG PO TABS: ORAL_TABLET | ORAL | 1 refills | Status: DC

## 2019-05-02 MED ORDER — FUROSEMIDE 20 MG PO TABS: 20.0000 mg | ORAL_TABLET | Freq: Every morning | ORAL | 3 refills | Status: DC

## 2019-05-02 NOTE — Telephone Encounter (Signed)
rx request for potassium (which I didn't see on her med list), xarelto, and lasix. Marland Kitchenplease advise. Deseree Kennon Holter, CMA

## 2019-05-03 ENCOUNTER — Encounter: Payer: Self-pay | Admitting: Family Medicine

## 2019-05-03 ENCOUNTER — Other Ambulatory Visit: Payer: Self-pay

## 2019-05-03 ENCOUNTER — Ambulatory Visit (INDEPENDENT_AMBULATORY_CARE_PROVIDER_SITE_OTHER): Payer: Medicare Other | Admitting: Family Medicine

## 2019-05-03 VITALS — BP 112/62 | HR 81

## 2019-05-03 DIAGNOSIS — E119 Type 2 diabetes mellitus without complications: Secondary | ICD-10-CM | POA: Diagnosis not present

## 2019-05-03 DIAGNOSIS — E039 Hypothyroidism, unspecified: Secondary | ICD-10-CM | POA: Diagnosis not present

## 2019-05-03 DIAGNOSIS — Z23 Encounter for immunization: Secondary | ICD-10-CM | POA: Diagnosis not present

## 2019-05-03 LAB — POCT GLYCOSYLATED HEMOGLOBIN (HGB A1C): HbA1c, POC (controlled diabetic range): 8.1 % — AB (ref 0.0–7.0)

## 2019-05-03 MED ORDER — LEVOTHYROXINE SODIUM 100 MCG PO TABS: ORAL_TABLET | ORAL | 2 refills | Status: DC

## 2019-05-03 NOTE — Patient Instructions (Signed)
It was nice to meet you today.  Here is a quick summary of the things we talked about:  Diabetes -Continue taking metformin 500 mg 2 pills 2 times per day -Change the way you take Lantus.  You should take Lantus 40 units every night -Change the way that you take Humalog.  you should take Humalog 10 units 2 times per day.  you should take these doses with your 2 biggest meals. -Continue checking your blood sugar in the morning while fasting  Thyroid -I have sent a new prescription for thyroid medication.  Begin taking levothyroxine 100 mcg daily -Come back in 6 weeks and we will recheck your thyroid level  Heart failure -You look good on exam today.  No changes are necessary for your heart failure medication today.

## 2019-05-03 NOTE — Assessment & Plan Note (Signed)
Levothyroxine increased to 100 mcg today. -Follow-up TSH in 6 weeks

## 2019-05-03 NOTE — Progress Notes (Signed)
    Subjective:  Mercedes Barr is a 73 y.o. female who presents to the [facility] today with a chief complaint of routine clinic visit.   HPI:  Diabetes Mercedes Barr reports some confusion regarding the appropriate dosing for medication.  She reports that she has been taking 10 units of Lantus daily (in place of 40 units) and 40 units of Humalog daily (in place of 10 units twice daily).  She also takes metformin 500 mg 2 pills twice a day.  She had no complaints of polyuria, polydipsia.  Her blood sugar this morning was 120 while fasting.  She reports no known episodes of hypoglycemia.  Heart failure Feels well.  No trouble breathing.  She denies any leg swelling recently.  Her heart failure medication currently includes furosemide 20 mg daily and losartan 25 mg daily.  Hypothyroid On 01/31/2019 her TSH was found to be mildly elevated at 4.9.  No changes were made at that time.  Her current medication includes Synthroid 75 mcg daily.  She denies symptoms of fatigue constipation.  Chief Complaint noted Review of Symptoms - see HPI PMH -diabetes, heart failure, hypertension  Objective:  Physical Exam: BP 112/62   Pulse 81   SpO2 98%    Gen: NAD, resting comfortably CV: RRR with no murmurs appreciated, no appreciable JVD Pulm: NWOB, CTAB with no crackles, wheezes, or rhonchi GI: Normal bowel sounds present. Soft, Nontender, Nondistended. MSK: no edema, cyanosis, or clubbing noted Skin: warm, dry Neuro: grossly normal, moves all extremities Psych: Normal affect and thought content  Results for orders placed or performed in visit on 05/03/19 (from the past 72 hour(s))  HgB A1c     Status: Abnormal   Collection Time: 05/03/19  8:48 AM  Result Value Ref Range   Hemoglobin A1C     HbA1c POC (<> result, manual entry)     HbA1c, POC (prediabetic range)     HbA1c, POC (controlled diabetic range) 8.1 (A) 0.0 - 7.0 %     Assessment/Plan:  DM (diabetes mellitus), type 2 Moderate  improvement in her A1c today.  She is she is some confusion regarding the appropriate doses for her insulin.  Significant time was discussing the difference between the 2 types of insulin. -Increase Lantus to 40 units nightly -Decrease Humalog to 10 units twice daily with largest meals -Continue metformin 1000 mg twice daily -Follow-up in 3 months  Hypothyroidism Levothyroxine increased to 100 mcg today. -Follow-up TSH in 6 weeks  (HFpEF) heart failure with preserved ejection fraction (HCC) Euvolemic on exam today.  No changes in medication at this time. -Continue furosemide 20 mg daily -Continue losartan 25 mg daily

## 2019-05-03 NOTE — Assessment & Plan Note (Signed)
Moderate improvement in her A1c today.  She is she is some confusion regarding the appropriate doses for her insulin.  Significant time was discussing the difference between the 2 types of insulin. -Increase Lantus to 40 units nightly -Decrease Humalog to 10 units twice daily with largest meals -Continue metformin 1000 mg twice daily -Follow-up in 3 months

## 2019-05-03 NOTE — Assessment & Plan Note (Signed)
Euvolemic on exam today.  No changes in medication at this time. -Continue furosemide 20 mg daily -Continue losartan 25 mg daily

## 2019-05-22 ENCOUNTER — Ambulatory Visit (INDEPENDENT_AMBULATORY_CARE_PROVIDER_SITE_OTHER): Payer: Medicare Other | Admitting: *Deleted

## 2019-05-22 DIAGNOSIS — I442 Atrioventricular block, complete: Secondary | ICD-10-CM

## 2019-05-22 DIAGNOSIS — I4819 Other persistent atrial fibrillation: Secondary | ICD-10-CM

## 2019-05-22 LAB — CUP PACEART REMOTE DEVICE CHECK
Battery Impedance: 160 Ohm
Battery Remaining Longevity: 137 mo
Battery Voltage: 2.78 V
Brady Statistic RV Percent Paced: 99 %
Date Time Interrogation Session: 20201014112609
Implantable Lead Implant Date: 19990203
Implantable Lead Implant Date: 19990203
Implantable Lead Location: 753859
Implantable Lead Location: 753860
Implantable Lead Model: 5068
Implantable Lead Model: 5092
Implantable Pulse Generator Implant Date: 20180711
Lead Channel Impedance Value: 659 Ohm
Lead Channel Impedance Value: 67 Ohm
Lead Channel Pacing Threshold Amplitude: 0.625 V
Lead Channel Pacing Threshold Pulse Width: 0.4 ms
Lead Channel Setting Pacing Amplitude: 2.5 V
Lead Channel Setting Pacing Pulse Width: 0.4 ms
Lead Channel Setting Sensing Sensitivity: 4 mV

## 2019-06-03 NOTE — Progress Notes (Signed)
Remote pacemaker transmission.   

## 2019-06-12 ENCOUNTER — Other Ambulatory Visit: Payer: Self-pay | Admitting: Family Medicine

## 2019-06-12 DIAGNOSIS — I10 Essential (primary) hypertension: Secondary | ICD-10-CM

## 2019-06-12 MED ORDER — LOSARTAN POTASSIUM 25 MG PO TABS: 25.0000 mg | ORAL_TABLET | Freq: Every day | ORAL | 3 refills | Status: DC

## 2019-06-27 ENCOUNTER — Other Ambulatory Visit: Payer: Self-pay | Admitting: Family Medicine

## 2019-06-28 DIAGNOSIS — H401131 Primary open-angle glaucoma, bilateral, mild stage: Secondary | ICD-10-CM | POA: Diagnosis not present

## 2019-08-15 ENCOUNTER — Other Ambulatory Visit: Payer: Self-pay | Admitting: *Deleted

## 2019-08-15 MED ORDER — ONETOUCH ULTRA BLUE VI STRP: ORAL_STRIP | 12 refills | Status: AC

## 2019-08-21 ENCOUNTER — Ambulatory Visit (INDEPENDENT_AMBULATORY_CARE_PROVIDER_SITE_OTHER): Payer: Medicare Other | Admitting: *Deleted

## 2019-08-21 DIAGNOSIS — I442 Atrioventricular block, complete: Secondary | ICD-10-CM | POA: Diagnosis not present

## 2019-08-24 LAB — CUP PACEART REMOTE DEVICE CHECK
Battery Impedance: 184 Ohm
Battery Remaining Longevity: 132 mo
Battery Voltage: 2.78 V
Brady Statistic RV Percent Paced: 99 %
Date Time Interrogation Session: 20210115075932
Implantable Lead Implant Date: 19990203
Implantable Lead Implant Date: 19990203
Implantable Lead Location: 753859
Implantable Lead Location: 753860
Implantable Lead Model: 5068
Implantable Lead Model: 5092
Implantable Pulse Generator Implant Date: 20180711
Lead Channel Impedance Value: 644 Ohm
Lead Channel Impedance Value: 67 Ohm
Lead Channel Pacing Threshold Amplitude: 0.625 V
Lead Channel Pacing Threshold Pulse Width: 0.4 ms
Lead Channel Setting Pacing Amplitude: 2.5 V
Lead Channel Setting Pacing Pulse Width: 0.4 ms
Lead Channel Setting Sensing Sensitivity: 4 mV

## 2019-09-04 ENCOUNTER — Telehealth: Payer: Self-pay

## 2019-09-04 NOTE — Telephone Encounter (Signed)
Forms are not in blue team folder. Check with provider. Salvatore Marvel, CMA

## 2019-09-04 NOTE — Telephone Encounter (Signed)
Patient contacted and informed of [employer] forms ready for pick up.

## 2019-09-04 NOTE — Telephone Encounter (Signed)
Patient calls nurse line checking on status of SCAT forms. I do not see anything in chart where patient dropped these forms off. Do you have these?

## 2019-09-04 NOTE — Telephone Encounter (Signed)
I have them with me at the [facility] clinic.  They were filled out last night.  I will bring them in at lunch today. Matilde Haymaker, MD

## 2019-09-25 ENCOUNTER — Other Ambulatory Visit: Payer: Self-pay | Admitting: *Deleted

## 2019-09-25 MED ORDER — LEVOTHYROXINE SODIUM 100 MCG PO TABS: ORAL_TABLET | ORAL | 2 refills | Status: DC

## 2019-09-27 ENCOUNTER — Other Ambulatory Visit: Payer: Self-pay

## 2019-09-27 MED ORDER — BD PEN NEEDLE MINI U/F 31G X 5 MM MISC: 2 refills | Status: DC

## 2019-09-30 ENCOUNTER — Other Ambulatory Visit: Payer: Self-pay | Admitting: *Deleted

## 2019-09-30 DIAGNOSIS — E119 Type 2 diabetes mellitus without complications: Secondary | ICD-10-CM

## 2019-09-30 MED ORDER — INSULIN LISPRO (1 UNIT DIAL) 100 UNIT/ML (KWIKPEN): 10.0000 [IU] | PEN_INJECTOR | Freq: Two times a day (BID) | SUBCUTANEOUS | 6 refills | Status: DC

## 2019-09-30 MED ORDER — PROPRANOLOL HCL 40 MG PO TABS: 40.0000 mg | ORAL_TABLET | Freq: Every day | ORAL | 1 refills | Status: DC

## 2019-09-30 MED ORDER — FLUTICASONE PROPIONATE HFA 110 MCG/ACT IN AERO: 1.0000 | INHALATION_SPRAY | Freq: Two times a day (BID) | RESPIRATORY_TRACT | 5 refills | Status: DC

## 2019-10-01 ENCOUNTER — Other Ambulatory Visit: Payer: Self-pay

## 2019-11-05 ENCOUNTER — Other Ambulatory Visit: Payer: Self-pay | Admitting: *Deleted

## 2019-11-05 MED ORDER — ROSUVASTATIN CALCIUM 20 MG PO TABS: 20.0000 mg | ORAL_TABLET | Freq: Every day | ORAL | 4 refills | Status: DC

## 2019-11-20 ENCOUNTER — Ambulatory Visit (INDEPENDENT_AMBULATORY_CARE_PROVIDER_SITE_OTHER): Payer: Medicare Other | Admitting: *Deleted

## 2019-11-20 DIAGNOSIS — I442 Atrioventricular block, complete: Secondary | ICD-10-CM

## 2019-11-20 LAB — CUP PACEART REMOTE DEVICE CHECK
Battery Impedance: 184 Ohm
Battery Remaining Longevity: 131 mo
Battery Voltage: 2.78 V
Brady Statistic RV Percent Paced: 99 %
Date Time Interrogation Session: 20210414070800
Implantable Lead Implant Date: 19990203
Implantable Lead Implant Date: 19990203
Implantable Lead Location: 753859
Implantable Lead Location: 753860
Implantable Lead Model: 5068
Implantable Lead Model: 5092
Implantable Pulse Generator Implant Date: 20180711
Lead Channel Impedance Value: 618 Ohm
Lead Channel Impedance Value: 67 Ohm
Lead Channel Pacing Threshold Amplitude: 0.625 V
Lead Channel Pacing Threshold Pulse Width: 0.4 ms
Lead Channel Setting Pacing Amplitude: 2.5 V
Lead Channel Setting Pacing Pulse Width: 0.4 ms
Lead Channel Setting Sensing Sensitivity: 4 mV

## 2019-11-20 NOTE — Progress Notes (Signed)
PPM Remote  

## 2019-12-18 ENCOUNTER — Other Ambulatory Visit: Payer: Self-pay | Admitting: Family Medicine

## 2019-12-19 NOTE — Telephone Encounter (Signed)
Patient scheduled follow up for 01/07/20.   Talbot Grumbling, RN

## 2019-12-19 NOTE — Telephone Encounter (Signed)
Please call and ask [name] to come in for a visit in the next month for use to look at her thyroid.    Thank you, Mercedes Haymaker, MD

## 2019-12-30 ENCOUNTER — Other Ambulatory Visit: Payer: Self-pay | Admitting: *Deleted

## 2019-12-30 ENCOUNTER — Other Ambulatory Visit: Payer: Self-pay | Admitting: Family Medicine

## 2019-12-30 DIAGNOSIS — I10 Essential (primary) hypertension: Secondary | ICD-10-CM

## 2019-12-30 DIAGNOSIS — I4819 Other persistent atrial fibrillation: Secondary | ICD-10-CM

## 2019-12-30 MED ORDER — CITALOPRAM HYDROBROMIDE 20 MG PO TABS: 20.0000 mg | ORAL_TABLET | Freq: Every morning | ORAL | 3 refills | Status: DC

## 2020-01-07 ENCOUNTER — Encounter: Payer: Self-pay | Admitting: Family Medicine

## 2020-01-07 ENCOUNTER — Ambulatory Visit (INDEPENDENT_AMBULATORY_CARE_PROVIDER_SITE_OTHER): Payer: Medicare Other | Admitting: Family Medicine

## 2020-01-07 ENCOUNTER — Other Ambulatory Visit: Payer: Self-pay

## 2020-01-07 VITALS — BP 120/60 | HR 74 | Ht 62.0 in | Wt 147.0 lb

## 2020-01-07 DIAGNOSIS — E039 Hypothyroidism, unspecified: Secondary | ICD-10-CM

## 2020-01-07 DIAGNOSIS — L299 Pruritus, unspecified: Secondary | ICD-10-CM | POA: Diagnosis not present

## 2020-01-07 DIAGNOSIS — R202 Paresthesia of skin: Secondary | ICD-10-CM | POA: Diagnosis not present

## 2020-01-07 DIAGNOSIS — E119 Type 2 diabetes mellitus without complications: Secondary | ICD-10-CM

## 2020-01-07 LAB — POCT GLYCOSYLATED HEMOGLOBIN (HGB A1C): HbA1c, POC (controlled diabetic range): 8.1 % — AB (ref 0.0–7.0)

## 2020-01-07 MED ORDER — CETIRIZINE HCL 10 MG PO TABS: 10.0000 mg | ORAL_TABLET | Freq: Every day | ORAL | 11 refills | Status: DC

## 2020-01-07 NOTE — Progress Notes (Signed)
    SUBJECTIVE:   CHIEF COMPLAINT / HPI:   Formication/pruritus Mercedes Barr reports that she has been having significant itching for roughly the past month.  This itching seems to occur all over her body and she reports the sensation of bugs crawling over her. Though she is itchy everywhere, it seems to be most severe around her shoulders and back.  The itching is worse at night.  She has tried washing her back with alcohol which does have a cool/soothing effect though it is temporary.  She is also tried putting triamcinolone cream over her shoulders and back which only provides temporary relief.  She has not had any previous issues with her liver.  She denies drinking alcohol.  She is previously tested negative for hepatitis C.  TSH Some fatigue  DM Current medications include Metformin 1000 mg twice daily, aspart 15 units twice daily and glargine 40 units daily.  She has been checking her blood sugar every morning and reports a fasting blood glucose of between 101 180.  PERTINENT  PMH / PSH: Diabetes  OBJECTIVE:   BP 120/60   Pulse 74   Ht 5\' 2"  (1.575 m)   Wt 147 lb (66.7 kg)   SpO2 98%   BMI 26.89 kg/m    General: Alert and cooperative and appears to be in no acute distress HEENT: Nonicteric sclera Cardio: Normal S1 and S2, no S3 or S4. Rhythm is regular. No murmurs or rubs.   Pulm: Clear to auscultation bilaterally, no crackles, wheezing, or diminished breath sounds. Normal respiratory effort Skin: No evidence of rashes.  Mild excoriations over her shoulders and middle back.  No bleeding or signs of infection.  No telangiectasias noted. Extremities: No peripheral edema. Warm/ well perfused.  Strong radial pulse.   ASSESSMENT/PLAN:   Formication Unclear etiology.  Allergies versus liver dysfunction.  Very low suspicion for alcohol withdrawal as she denies alcohol use.  Low suspicion for uremic pruritus.  She deferred gabapentin and Lyrica based on previous allergic reaction  of itchiness. -Cetirizine 10 mg daily for itching -Follow-up CMP, CBC  DM (diabetes mellitus), type 2 A1c 8.1 today stable from 8.1 previously. -Increase nighttime glargine from 40 to 45 units. -Continue to monitor fasting CBGs -Follow-up in 3 months  Hypothyroidism -Follow-up TSH     Matilde Haymaker, MD [place]

## 2020-01-07 NOTE — Patient Instructions (Addendum)
Itching: We will check you liver function to make sure that is not contributing. In the meantime, start taking cetirizine once a day to see if that is helpful.  Thyroid:  We will check you thyroid level today and I will let you know if you need to change your medication.   Diabetes: Increase your long acting insulin to 45 units at night. Please follow up with your eye doctor for a diabetic eye exam.  Come back to clinic in 3 months for another diabetes visit.

## 2020-01-07 NOTE — Assessment & Plan Note (Signed)
Follow up TSH

## 2020-01-07 NOTE — Assessment & Plan Note (Signed)
A1c 8.1 today stable from 8.1 previously. -Increase nighttime glargine from 40 to 45 units. -Continue to monitor fasting CBGs -Follow-up in 3 months

## 2020-01-07 NOTE — Assessment & Plan Note (Signed)
Unclear etiology.  Allergies versus liver dysfunction.  Very low suspicion for alcohol withdrawal as she denies alcohol use.  Low suspicion for uremic pruritus.  She deferred gabapentin and Lyrica based on previous allergic reaction of itchiness. -Cetirizine 10 mg daily for itching -Follow-up CMP, CBC

## 2020-01-08 ENCOUNTER — Encounter: Payer: Self-pay | Admitting: Family Medicine

## 2020-01-08 LAB — COMPREHENSIVE METABOLIC PANEL
ALT: 17 IU/L (ref 0–32)
AST: 19 IU/L (ref 0–40)
Albumin/Globulin Ratio: 1.7 (ref 1.2–2.2)
Albumin: 4.3 g/dL (ref 3.7–4.7)
Alkaline Phosphatase: 49 IU/L (ref 48–121)
BUN/Creatinine Ratio: 15 (ref 12–28)
BUN: 13 mg/dL (ref 8–27)
Bilirubin Total: 0.3 mg/dL (ref 0.0–1.2)
CO2: 24 mmol/L (ref 20–29)
Calcium: 9.3 mg/dL (ref 8.7–10.3)
Chloride: 104 mmol/L (ref 96–106)
Creatinine, Ser: 0.84 mg/dL (ref 0.57–1.00)
GFR calc Af Amer: 80 mL/min/{1.73_m2} (ref >59)
GFR calc non Af Amer: 70 mL/min/{1.73_m2} (ref >59)
Globulin, Total: 2.6 g/dL (ref 1.5–4.5)
Glucose: 241 mg/dL — ABNORMAL HIGH (ref 65–99)
Potassium: 4.3 mmol/L (ref 3.5–5.2)
Sodium: 142 mmol/L (ref 134–144)
Total Protein: 6.9 g/dL (ref 6.0–8.5)

## 2020-01-08 LAB — LIPID PANEL
Chol/HDL Ratio: 3.9 ratio (ref 0.0–4.4)
Cholesterol, Total: 135 mg/dL (ref 100–199)
HDL: 35 mg/dL — ABNORMAL LOW (ref >39)
LDL Chol Calc (NIH): 67 mg/dL (ref 0–99)
Triglycerides: 199 mg/dL — ABNORMAL HIGH (ref 0–149)
VLDL Cholesterol Cal: 33 mg/dL (ref 5–40)

## 2020-01-08 LAB — CBC
Hematocrit: 37.4 % (ref 34.0–46.6)
Hemoglobin: 12.1 g/dL (ref 11.1–15.9)
MCH: 27.1 pg (ref 26.6–33.0)
MCHC: 32.4 g/dL (ref 31.5–35.7)
MCV: 84 fL (ref 79–97)
Platelets: 205 10*3/uL (ref 150–450)
RBC: 4.46 x10E6/uL (ref 3.77–5.28)
RDW: 12.9 % (ref 11.7–15.4)
WBC: 4.5 10*3/uL (ref 3.4–10.8)

## 2020-02-19 ENCOUNTER — Ambulatory Visit (INDEPENDENT_AMBULATORY_CARE_PROVIDER_SITE_OTHER): Payer: Medicare Other | Admitting: *Deleted

## 2020-02-19 DIAGNOSIS — I442 Atrioventricular block, complete: Secondary | ICD-10-CM

## 2020-02-20 ENCOUNTER — Telehealth: Payer: Self-pay | Admitting: Cardiovascular Disease

## 2020-02-20 NOTE — Telephone Encounter (Signed)
Patient is calling to let the [facility] know that she got her days mixed up, and thought yesterday was Tuesday so she didn't send it in her transmission. Please advise.

## 2020-02-20 NOTE — Telephone Encounter (Signed)
Spoke with pt to explain she can send transmission at any time. Pt agrees to send transmission this afternoon. She denies any questions at this time.

## 2020-02-21 LAB — CUP PACEART REMOTE DEVICE CHECK
Battery Impedance: 208 Ohm
Battery Remaining Longevity: 127 mo
Battery Voltage: 2.78 V
Brady Statistic RV Percent Paced: 99 %
Date Time Interrogation Session: 20210715142510
Implantable Lead Implant Date: 19990203
Implantable Lead Implant Date: 19990203
Implantable Lead Location: 753859
Implantable Lead Location: 753860
Implantable Lead Model: 5068
Implantable Lead Model: 5092
Implantable Pulse Generator Implant Date: 20180711
Lead Channel Impedance Value: 657 Ohm
Lead Channel Impedance Value: 67 Ohm
Lead Channel Pacing Threshold Amplitude: 0.75 V
Lead Channel Pacing Threshold Pulse Width: 0.4 ms
Lead Channel Setting Pacing Amplitude: 2.5 V
Lead Channel Setting Pacing Pulse Width: 0.4 ms
Lead Channel Setting Sensing Sensitivity: 4 mV

## 2020-02-25 NOTE — Progress Notes (Signed)
Remote pacemaker transmission.   

## 2020-03-22 ENCOUNTER — Other Ambulatory Visit: Payer: Self-pay | Admitting: Family Medicine

## 2020-03-22 DIAGNOSIS — K219 Gastro-esophageal reflux disease without esophagitis: Secondary | ICD-10-CM

## 2020-03-24 ENCOUNTER — Other Ambulatory Visit: Payer: Self-pay

## 2020-03-24 MED ORDER — METFORMIN HCL 500 MG PO TABS: ORAL_TABLET | ORAL | 4 refills | Status: DC

## 2020-04-17 ENCOUNTER — Other Ambulatory Visit: Payer: Self-pay

## 2020-04-17 DIAGNOSIS — E119 Type 2 diabetes mellitus without complications: Secondary | ICD-10-CM

## 2020-04-18 MED ORDER — INSULIN LISPRO (1 UNIT DIAL) 100 UNIT/ML (KWIKPEN): 10.0000 [IU] | PEN_INJECTOR | Freq: Two times a day (BID) | SUBCUTANEOUS | 0 refills | Status: DC

## 2020-04-21 NOTE — Telephone Encounter (Signed)
I am ok with this refill but for some reason I can't approve it.  Was it put in correctly? Matilde Haymaker, MD

## 2020-04-22 ENCOUNTER — Other Ambulatory Visit: Payer: Self-pay | Admitting: Family Medicine

## 2020-04-22 DIAGNOSIS — I5030 Unspecified diastolic (congestive) heart failure: Secondary | ICD-10-CM

## 2020-04-22 DIAGNOSIS — I10 Essential (primary) hypertension: Secondary | ICD-10-CM

## 2020-04-22 NOTE — Addendum Note (Signed)
Addended by: Salvatore Marvel on: 04/22/2020 09:10 AM   Modules accepted: Orders

## 2020-04-24 NOTE — Telephone Encounter (Signed)
Medication has been approved and fixed.  Salvatore Marvel, CMA

## 2020-05-20 ENCOUNTER — Ambulatory Visit (INDEPENDENT_AMBULATORY_CARE_PROVIDER_SITE_OTHER): Payer: Medicare Other

## 2020-05-20 DIAGNOSIS — I442 Atrioventricular block, complete: Secondary | ICD-10-CM

## 2020-05-22 ENCOUNTER — Ambulatory Visit
Admission: RE | Admit: 2020-05-22 | Discharge: 2020-05-22 | Disposition: A | Discharge status: Home / Self Care | Payer: Medicare Other | Source: Ambulatory Visit | Attending: Family Medicine | Admitting: Family Medicine

## 2020-05-22 ENCOUNTER — Other Ambulatory Visit: Payer: Self-pay | Admitting: Family Medicine

## 2020-05-22 ENCOUNTER — Encounter: Payer: Self-pay | Admitting: Family Medicine

## 2020-05-22 ENCOUNTER — Ambulatory Visit (INDEPENDENT_AMBULATORY_CARE_PROVIDER_SITE_OTHER): Payer: Medicare Other | Admitting: Family Medicine

## 2020-05-22 ENCOUNTER — Other Ambulatory Visit: Payer: Self-pay

## 2020-05-22 VITALS — BP 128/72 | HR 93 | Temp 98.7°F | Wt 147.6 lb

## 2020-05-22 DIAGNOSIS — E119 Type 2 diabetes mellitus without complications: Secondary | ICD-10-CM

## 2020-05-22 DIAGNOSIS — E039 Hypothyroidism, unspecified: Secondary | ICD-10-CM | POA: Diagnosis not present

## 2020-05-22 DIAGNOSIS — M47814 Spondylosis without myelopathy or radiculopathy, thoracic region: Secondary | ICD-10-CM | POA: Diagnosis not present

## 2020-05-22 DIAGNOSIS — M545 Low back pain, unspecified: Secondary | ICD-10-CM

## 2020-05-22 DIAGNOSIS — G4700 Insomnia, unspecified: Secondary | ICD-10-CM

## 2020-05-22 LAB — CUP PACEART REMOTE DEVICE CHECK
Battery Impedance: 234 Ohm
Battery Remaining Longevity: 123 mo
Battery Voltage: 2.79 V
Brady Statistic RV Percent Paced: 99 %
Date Time Interrogation Session: 20211013094300
Implantable Lead Implant Date: 19990203
Implantable Lead Implant Date: 19990203
Implantable Lead Location: 753859
Implantable Lead Location: 753860
Implantable Lead Model: 5068
Implantable Lead Model: 5092
Implantable Pulse Generator Implant Date: 20180711
Lead Channel Impedance Value: 657 Ohm
Lead Channel Impedance Value: 67 Ohm
Lead Channel Pacing Threshold Amplitude: 0.625 V
Lead Channel Pacing Threshold Pulse Width: 0.4 ms
Lead Channel Setting Pacing Amplitude: 2.5 V
Lead Channel Setting Pacing Pulse Width: 0.4 ms
Lead Channel Setting Sensing Sensitivity: 4 mV

## 2020-05-22 LAB — POCT GLYCOSYLATED HEMOGLOBIN (HGB A1C): HbA1c, POC (controlled diabetic range): 8.4 % — AB (ref 0.0–7.0)

## 2020-05-22 MED ORDER — LANTUS SOLOSTAR 100 UNIT/ML ~~LOC~~ SOPN: 15.0000 [IU] | PEN_INJECTOR | Freq: Two times a day (BID) | SUBCUTANEOUS | 3 refills | Status: DC

## 2020-05-22 NOTE — Progress Notes (Signed)
    SUBJECTIVE:   CHIEF COMPLAINT / HPI:   Diabetes Her current diabetes medications include: -Metformin 100 mg BID -Humalog 10 BID -Lantus 15 BID AM CBGs 116 She reports that she has been eating a lot of fruit lately and wonders if she should cut back on her fruit to lower her blood sugar.  Back pain She reports that she started having some right-sided back pain about 1-1/2 weeks ago.  This may have started after she tried lifting her 20 pound grandson.  She notes a right middle back pain that is generally described as an achy dull discomfort.  About 7/10 in severity.  She has been taking Tylenol 650 mg every 6 hours without significant pain relief.  She primarily notices this discomfort when she is trying to move around or turns in certain positions.  She has been having some trouble sleeping as a result.  Insomnia This seems to be worsened lately due to her low back pain but she notes that she has had trouble sleeping which precedes her low back pain.  She was hoping for general guidance about sleep habits.   PERTINENT  PMH / PSH: Hypothyroidism, osteoporosis  OBJECTIVE:   BP 128/72   Pulse 93   Temp 98.7 F (37.1 C) (Oral)   Wt 147 lb 9.6 oz (67 kg)   SpO2 99%   BMI 27.00 kg/m    General: Alert and cooperative and appears to be in no acute distress Cardio: Normal S1 and S2, no S3 or S4. Rhythm is regular. No murmurs or rubs.   Pulm: Clear to auscultation bilaterally, no crackles, wheezing, or diminished breath sounds. Normal respiratory effort Abdomen: Bowel sounds normal. Abdomen soft and non-tender.  Extremities: No peripheral edema. Warm/ well perfused.  Strong radial pulse. Back: Some tenderness with percussion of the lower thoracic, upper lumbar vertebrae.  Paraspinal muscle tenderness in the same region.  Some reproducible pain with axial rotation of the spine.  Straight leg raise negative. Neuro: Cranial nerves grossly intact  ASSESSMENT/PLAN:   Insomnia -Sleep  hygiene discussed -No screens an hour before bed -No caffeine after lunch -Typical bedtime routine every night -Bedroom is only for sleeping -Melatonin 5 mg at night  DM (diabetes mellitus), type 2 A1c slightly worsened: 8.4 from 8.1. -Increase Lantus to 20 units in the morning and 15 units in the evening -Continue short acting 10 units twice daily -Continue typical consumption of fruits -Continue Metformin 1000 mg twice daily -Continue rosuvastatin 20 mg -Continue losartan 25 mg -Return to clinic in 3 months  Low back pain No sciatica today.  Most likely muscle strain although her age and osteoporosis to put her at risk for compression fracture.  We will follow-up thoracic and spine imaging -Continue Tylenol -Return to clinic in 2-4 weeks if there is no significant improvement -Follow-up x-rays     Matilde Haymaker, MD [place]

## 2020-05-22 NOTE — Assessment & Plan Note (Signed)
A1c slightly worsened: 8.4 from 8.1. -Increase Lantus to 20 units in the morning and 15 units in the evening -Continue short acting 10 units twice daily -Continue typical consumption of fruits -Continue Metformin 1000 mg twice daily -Continue rosuvastatin 20 mg -Continue losartan 25 mg -Return to clinic in 3 months

## 2020-05-22 NOTE — Patient Instructions (Signed)
Back pain: I think most likely, this is a muscle strain of your low back.  Because of your age, we should get an x-ray to make sure there is no concern for compression fracture.  I recommend you continue taking Tylenol as you have been taking.  If the x-ray is unremarkable, lets have you come back to clinic if you have not had improvement in the next 2-4 weeks.  Diabetes: Looks like your diabetes is just slightly worse than our goal.  Your A1c was 8.4 it would be nice if you are A1c was under 8.0.  Lets increase your long-acting insulin just a little bit.  Start taking 20 units in the morning and 15 units in the evening.  Lets have you come back to clinic in 3 months to recheck this.

## 2020-05-22 NOTE — Assessment & Plan Note (Signed)
-  Sleep hygiene discussed -No screens an hour before bed -No caffeine after lunch -Typical bedtime routine every night -Bedroom is only for sleeping -Melatonin 5 mg at night

## 2020-05-22 NOTE — Assessment & Plan Note (Signed)
No sciatica today.  Most likely muscle strain although her age and osteoporosis to put her at risk for compression fracture.  We will follow-up thoracic and spine imaging -Continue Tylenol -Return to clinic in 2-4 weeks if there is no significant improvement -Follow-up x-rays

## 2020-05-23 LAB — TSH: TSH: 15.8 u[IU]/mL — ABNORMAL HIGH (ref 0.450–4.500)

## 2020-05-25 NOTE — Progress Notes (Signed)
Remote pacemaker transmission.   

## 2020-05-26 ENCOUNTER — Other Ambulatory Visit: Payer: Self-pay | Admitting: Family Medicine

## 2020-05-26 MED ORDER — LEVOTHYROXINE SODIUM 112 MCG PO TABS: 112.0000 ug | ORAL_TABLET | ORAL | 3 refills | Status: DC

## 2020-05-26 NOTE — Progress Notes (Signed)
Called and discussed normal back x-rays in addition to elevated TSH.  A new prescription of an increased dose of levothyroxine (112 mcg) was sent in.  She is asked to follow-up in clinic in 2 months.

## 2020-07-27 ENCOUNTER — Other Ambulatory Visit: Payer: Self-pay | Admitting: Family Medicine

## 2020-07-27 DIAGNOSIS — E119 Type 2 diabetes mellitus without complications: Secondary | ICD-10-CM

## 2020-07-30 DIAGNOSIS — Z7984 Long term (current) use of oral hypoglycemic drugs: Secondary | ICD-10-CM | POA: Diagnosis not present

## 2020-07-30 DIAGNOSIS — E119 Type 2 diabetes mellitus without complications: Secondary | ICD-10-CM | POA: Diagnosis not present

## 2020-07-30 DIAGNOSIS — H2513 Age-related nuclear cataract, bilateral: Secondary | ICD-10-CM | POA: Diagnosis not present

## 2020-07-30 DIAGNOSIS — H401131 Primary open-angle glaucoma, bilateral, mild stage: Secondary | ICD-10-CM | POA: Diagnosis not present

## 2020-09-10 ENCOUNTER — Other Ambulatory Visit: Payer: Self-pay | Admitting: Family Medicine

## 2020-09-10 ENCOUNTER — Other Ambulatory Visit: Payer: Self-pay | Admitting: *Deleted

## 2020-09-10 DIAGNOSIS — E119 Type 2 diabetes mellitus without complications: Secondary | ICD-10-CM

## 2020-09-10 MED ORDER — PRODIGY NO CODING BLOOD GLUC VI STRP
ORAL_STRIP | 12 refills | Status: DC
Start: 2020-09-10 — End: 2020-10-02

## 2020-10-01 ENCOUNTER — Other Ambulatory Visit: Payer: Self-pay | Admitting: Family Medicine

## 2020-10-29 DIAGNOSIS — H401131 Primary open-angle glaucoma, bilateral, mild stage: Secondary | ICD-10-CM | POA: Diagnosis not present

## 2020-11-03 ENCOUNTER — Other Ambulatory Visit: Payer: Self-pay | Admitting: Family Medicine

## 2020-11-03 DIAGNOSIS — I4819 Other persistent atrial fibrillation: Secondary | ICD-10-CM

## 2020-11-03 DIAGNOSIS — I10 Essential (primary) hypertension: Secondary | ICD-10-CM

## 2020-11-18 ENCOUNTER — Ambulatory Visit (INDEPENDENT_AMBULATORY_CARE_PROVIDER_SITE_OTHER): Payer: Medicare Other

## 2020-11-18 DIAGNOSIS — I4819 Other persistent atrial fibrillation: Secondary | ICD-10-CM | POA: Diagnosis not present

## 2020-11-19 LAB — CUP PACEART REMOTE DEVICE CHECK
Battery Impedance: 233 Ohm
Battery Remaining Longevity: 123 mo
Battery Voltage: 2.78 V
Brady Statistic RV Percent Paced: 100 %
Date Time Interrogation Session: 20220413081746
Implantable Lead Implant Date: 19990203
Implantable Lead Implant Date: 19990203
Implantable Lead Location: 753859
Implantable Lead Location: 753860
Implantable Lead Model: 5068
Implantable Lead Model: 5092
Implantable Pulse Generator Implant Date: 20180711
Lead Channel Impedance Value: 640 Ohm
Lead Channel Impedance Value: 67 Ohm
Lead Channel Pacing Threshold Amplitude: 0.625 V
Lead Channel Pacing Threshold Pulse Width: 0.4 ms
Lead Channel Setting Pacing Amplitude: 2.5 V
Lead Channel Setting Pacing Pulse Width: 0.4 ms
Lead Channel Setting Sensing Sensitivity: 4 mV

## 2020-12-03 NOTE — Progress Notes (Signed)
Remote pacemaker transmission.   

## 2020-12-05 ENCOUNTER — Other Ambulatory Visit: Payer: Self-pay | Admitting: Family Medicine

## 2021-01-27 ENCOUNTER — Other Ambulatory Visit: Payer: Self-pay | Admitting: Family Medicine

## 2021-01-27 DIAGNOSIS — K219 Gastro-esophageal reflux disease without esophagitis: Secondary | ICD-10-CM

## 2021-02-12 ENCOUNTER — Other Ambulatory Visit: Payer: Self-pay

## 2021-02-12 ENCOUNTER — Encounter: Payer: Self-pay | Admitting: Family Medicine

## 2021-02-12 ENCOUNTER — Ambulatory Visit (INDEPENDENT_AMBULATORY_CARE_PROVIDER_SITE_OTHER): Payer: Medicare Other | Admitting: Family Medicine

## 2021-02-12 VITALS — BP 122/70 | HR 90 | Ht 62.0 in | Wt 143.0 lb

## 2021-02-12 DIAGNOSIS — K219 Gastro-esophageal reflux disease without esophagitis: Secondary | ICD-10-CM | POA: Diagnosis not present

## 2021-02-12 DIAGNOSIS — J42 Unspecified chronic bronchitis: Secondary | ICD-10-CM

## 2021-02-12 DIAGNOSIS — G4700 Insomnia, unspecified: Secondary | ICD-10-CM | POA: Diagnosis not present

## 2021-02-12 DIAGNOSIS — E1110 Type 2 diabetes mellitus with ketoacidosis without coma: Secondary | ICD-10-CM | POA: Diagnosis not present

## 2021-02-12 DIAGNOSIS — R1013 Epigastric pain: Secondary | ICD-10-CM | POA: Diagnosis not present

## 2021-02-12 DIAGNOSIS — T84012D Broken internal right knee prosthesis, subsequent encounter: Secondary | ICD-10-CM

## 2021-02-12 DIAGNOSIS — E039 Hypothyroidism, unspecified: Secondary | ICD-10-CM | POA: Diagnosis not present

## 2021-02-12 DIAGNOSIS — E7800 Pure hypercholesterolemia, unspecified: Secondary | ICD-10-CM

## 2021-02-12 LAB — POCT GLYCOSYLATED HEMOGLOBIN (HGB A1C): HbA1c, POC (controlled diabetic range): 8.7 % — AB (ref 0.0–7.0)

## 2021-02-12 MED ORDER — FAMOTIDINE 20 MG PO TABS: 20.0000 mg | ORAL_TABLET | Freq: Two times a day (BID) | ORAL | 3 refills | Status: DC

## 2021-02-12 MED ORDER — GABAPENTIN 100 MG PO CAPS: 100.0000 mg | ORAL_CAPSULE | Freq: Every day | ORAL | 0 refills | Status: DC

## 2021-02-12 MED ORDER — ALBUTEROL SULFATE HFA 108 (90 BASE) MCG/ACT IN AERS: INHALATION_SPRAY | RESPIRATORY_TRACT | 1 refills | Status: DC

## 2021-02-12 MED ORDER — MELATONIN 3 MG PO TABS: 3.0000 mg | ORAL_TABLET | Freq: Every day | ORAL | 0 refills | Status: DC

## 2021-02-12 NOTE — Assessment & Plan Note (Signed)
Worsening reflux with increased epigastric pain since January 2022.  Currently on omeprazole 20 mg twice daily.  Given patient's age and worsening symptoms while on treatment there is consideration for H. Pylori or a need for EGD for other pathology. - referral to GI - Famotidine 20mg  daily - Can continue omeprazole for the moment, consider trialing off.

## 2021-02-12 NOTE — Assessment & Plan Note (Signed)
Refilling albuterol inhaler.

## 2021-02-12 NOTE — Assessment & Plan Note (Signed)
Patient with continued right knee pain.  Currently taking Tylenol 650 mg every 6 hours as needed and Voltaren gel as needed.  Has not been doing any of the stretches or exercises recommended from PT previously, though is active with her 1-month-old grandson. -Gave patient after surgery exercises and stretches to help improve mobility -Continue Tylenol Voltaren gel as needed, would prefer decreasing the amount of Tylenol

## 2021-02-12 NOTE — Assessment & Plan Note (Signed)
A1c today 8.7, increased from 8.4 several months ago.  Patient does not have much knowledge of her nutritional and dietary habits.  Would like referral for nutrition -Dr. Jenne Campus information given to patient -Patient instructed she is to call Dr. Jenne Campus

## 2021-02-12 NOTE — Patient Instructions (Addendum)
For your knee pain I want you to continue doing the Tylenol and Voltaren gel.  Below are some exercises and stretches that I recommend trying to do.  It will be important to do those at least once a day.  For your reflux and prescribing famotidine which she can take daily.  If the omeprazole is not helping you can trial not taking it for a day and see how you feel.  But I am also can a send in a referral to the GI doctor just to make sure given your age and that your symptoms are getting worse.  For your feet pain I am going to prescribe you gabapentin 100 mg to take at night.  You have had this medication before, if you have any issues with it please let me know.  For your insomnia we can trial a small dose of melatonin, I am sending in a prescription for 3 mg to take at night.  We can start off with splitting the medication in half and taking that, if it does not help he can take a full tablet and see how that does.  For your diabetes, below is some information for basic diet changes.  I am also sending in a referral for nutrition therapist.  You can call Dr. Jenne Campus in our office at [phone], if she is not able to help you due to insurance we have another provider that we can send you to.  We are getting labs to check your thyroid, cholesterol, kidney function today.         Diet Recommendations for Diabetes   Starchy (carb) foods: Bread, rice, pasta, potatoes, corn, cereal, grits, crackers, bagels, muffins, all baked goods.  (Fruits, milk, and yogurt also have carbohydrate, but most of these foods will not spike your blood sugar as the starchy foods will.)  A few fruits do cause high blood sugars; use small portions of bananas (limit to 1/2 at a time), grapes, watermelon, oranges, and most tropical fruits.    Protein foods: Meat, fish, poultry, eggs, dairy foods, and beans such as pinto and kidney beans (beans also provide carbohydrate).   1. Eat at least 3 meals and 1-2 snacks per  day. Never go more than 4-5 hours while awake without eating. Eat breakfast within the first hour of getting up.   2. Limit starchy foods to TWO per meal and ONE per snack. ONE portion of a starchy  food is equal to the following:   - ONE slice of bread (or its equivalent, such as half of a hamburger bun).   - 1/2 cup of a "scoopable" starchy food such as potatoes or rice.   - 15 grams of carbohydrate as shown on food label.  3. Include at every meal: a protein food, a carb food, and vegetables and/or fruit.   - Obtain twice the volume of veg's as protein or carbohydrate foods for both lunch and dinner.   - Fresh or frozen veg's are best.   - Keep frozen veg's on hand for a quick vegetable serving.

## 2021-02-12 NOTE — Assessment & Plan Note (Signed)
Patient interested in trying melatonin.  Had OTC but was unsure if the dose was correct. -Trial of melatonin 3 mg (start with half tablet and increase to full tablet if ineffective)

## 2021-02-12 NOTE — Progress Notes (Signed)
SUBJECTIVE:   CHIEF COMPLAINT / HPI:   Type 2 DM Patient reports that she has had an increase in her activity because she is helping take care of her 47-month-old grandson but that she is unsure if she is been doing the greatest on her diet.  Currently taking Lantus 40 units at night, Humalog 10 units twice daily, metformin 1000 mg twice daily.  Knee pain Chronic. S/p replacement and a repeat surgery. Taking Tylenol 650 every 6 hours as needed and Voltaren gel as needed.  Has not been doing her PT exercises as she is felt they are somewhat difficult to do.  Bilateral foot pain Patient reports a burning nerve sensation down the tops of her feet.  Patient states she is taking gabapentin before and it gives her itching.  Has also noticed onychomycosis in her bilateral great toes which she is treating with ketoconazole and is improving.  Insomnia Patient has been struggling with insomnia for a while, right now is helping take care of her 64-month-old grandson, which is causing some sleeping issues.  She is having trouble with initiation of sleep not necessarily with staying asleep.  Was wondering if she would be able to take melatonin.  COPD Is using her Flovent inhaler daily but is out of her albuterol rescue inhaler.  GERD Patient reports that she has been having an increase in her epigastric pain/reflux since January of this year.  She is on omeprazole twice daily which is not helping much.  She states that she gets a burning sensation especially when she lays down and will have to sit up.  She eats around 5-6 p.m. and then she will not be able to lay down really until about 2 AM due to the burning sensation.  She states now she is eating around 4 PM and is able to tolerate sleeping   PERTINENT  PMH / PSH: Reviewed  OBJECTIVE:   BP 122/70   Pulse 90   Ht 5\' 2"  (1.575 m)   Wt 143 lb (64.9 kg)   SpO2 98%   BMI 26.16 kg/m   Gen: well-appearing, NAD CV: RRR, no m/r/g appreciated,  no peripheral edema Pulm: CTAB, no wheezes/crackles GI: soft, non-distended, diffuse tenderness to palpation worse over the epigastric region, bowel sounds present Knee: - Inspection: midline scar noted from prior surgery. No swelling/effusion, erythema or bruising. Skin intact - Palpation: TTP over the medial aspect of the patella - ROM: decreased ROM with active and passive motion (especially extension) - Strength: 5/5 strength - Neuro/vasc: NV intact  ASSESSMENT/PLAN:   COPD (chronic obstructive pulmonary disease) Refilling albuterol inhaler.  GERD Worsening reflux with increased epigastric pain since January 2022.  Currently on omeprazole 20 mg twice daily.  Given patient's age and worsening symptoms while on treatment there is consideration for H. Pylori or a need for EGD for other pathology. - referral to GI - Famotidine 20mg  daily - Can continue omeprazole for the moment, consider trialing off.   DM (diabetes mellitus), type 2 (HCC) A1c today 8.7, increased from 8.4 several months ago.  Patient does not have much knowledge of her nutritional and dietary habits.  Would like referral for nutrition -Dr. Jenne Campus information given to patient -Patient instructed she is to call Dr. Jenne Campus  Failed total right knee replacement Patient with continued right knee pain.  Currently taking Tylenol 650 mg every 6 hours as needed and Voltaren gel as needed.  Has not been doing any of the stretches or exercises  recommended from PT previously, though is active with her 15-month-old grandson. -Gave patient after surgery exercises and stretches to help improve mobility -Continue Tylenol Voltaren gel as needed, would prefer decreasing the amount of Tylenol  Insomnia Patient interested in trying melatonin.  Had OTC but was unsure if the dose was correct. -Trial of melatonin 3 mg (start with half tablet and increase to full tablet if ineffective)   Hypothyroidism Checking TSH today, currently on  156mcg  HLD Checking lipid panel today.   Rise Patience, [place]

## 2021-02-13 ENCOUNTER — Other Ambulatory Visit: Payer: Self-pay | Admitting: Family Medicine

## 2021-02-13 DIAGNOSIS — G4700 Insomnia, unspecified: Secondary | ICD-10-CM

## 2021-02-13 LAB — BASIC METABOLIC PANEL
BUN/Creatinine Ratio: 18 (ref 12–28)
BUN: 15 mg/dL (ref 8–27)
CO2: 23 mmol/L (ref 20–29)
Calcium: 9.4 mg/dL (ref 8.7–10.3)
Chloride: 99 mmol/L (ref 96–106)
Creatinine, Ser: 0.84 mg/dL (ref 0.57–1.00)
Glucose: 155 mg/dL — ABNORMAL HIGH (ref 65–99)
Potassium: 4.6 mmol/L (ref 3.5–5.2)
Sodium: 139 mmol/L (ref 134–144)
eGFR: 73 mL/min/{1.73_m2} (ref >59)

## 2021-02-13 LAB — LIPID PANEL
Chol/HDL Ratio: 2.8 ratio (ref 0.0–4.4)
Cholesterol, Total: 87 mg/dL — ABNORMAL LOW (ref 100–199)
HDL: 31 mg/dL — ABNORMAL LOW (ref >39)
LDL Chol Calc (NIH): 35 mg/dL (ref 0–99)
Triglycerides: 114 mg/dL (ref 0–149)
VLDL Cholesterol Cal: 21 mg/dL (ref 5–40)

## 2021-02-13 LAB — TSH: TSH: 0.561 u[IU]/mL (ref 0.450–4.500)

## 2021-02-15 ENCOUNTER — Other Ambulatory Visit: Payer: Self-pay

## 2021-02-16 ENCOUNTER — Encounter: Payer: Self-pay | Admitting: Family Medicine

## 2021-02-16 MED ORDER — ROSUVASTATIN CALCIUM 20 MG PO TABS
20.0000 mg | ORAL_TABLET | Freq: Every day | ORAL | 4 refills | Status: DC
Start: 2021-02-16 — End: 2022-02-24

## 2021-02-16 MED ORDER — BD PEN NEEDLE MINI U/F 31G X 5 MM MISC: 2 refills | Status: DC

## 2021-02-17 ENCOUNTER — Ambulatory Visit (INDEPENDENT_AMBULATORY_CARE_PROVIDER_SITE_OTHER): Payer: Medicare Other

## 2021-02-17 DIAGNOSIS — I442 Atrioventricular block, complete: Secondary | ICD-10-CM

## 2021-02-18 LAB — CUP PACEART REMOTE DEVICE CHECK
Battery Impedance: 258 Ohm
Battery Remaining Longevity: 120 mo
Battery Voltage: 2.79 V
Brady Statistic RV Percent Paced: 100 %
Date Time Interrogation Session: 20220714093608
Implantable Lead Implant Date: 19990203
Implantable Lead Implant Date: 19990203
Implantable Lead Location: 753859
Implantable Lead Location: 753860
Implantable Lead Model: 5068
Implantable Lead Model: 5092
Implantable Pulse Generator Implant Date: 20180711
Lead Channel Impedance Value: 67 Ohm
Lead Channel Impedance Value: 670 Ohm
Lead Channel Pacing Threshold Amplitude: 0.625 V
Lead Channel Pacing Threshold Pulse Width: 0.4 ms
Lead Channel Setting Pacing Amplitude: 2.5 V
Lead Channel Setting Pacing Pulse Width: 0.4 ms
Lead Channel Setting Sensing Sensitivity: 4 mV

## 2021-02-19 ENCOUNTER — Other Ambulatory Visit: Payer: Self-pay | Admitting: Family Medicine

## 2021-02-19 DIAGNOSIS — R1013 Epigastric pain: Secondary | ICD-10-CM

## 2021-02-22 ENCOUNTER — Encounter: Payer: Self-pay | Admitting: Cardiovascular Disease

## 2021-02-22 ENCOUNTER — Other Ambulatory Visit: Payer: Self-pay

## 2021-02-22 ENCOUNTER — Ambulatory Visit (INDEPENDENT_AMBULATORY_CARE_PROVIDER_SITE_OTHER): Payer: Medicare Other | Admitting: Cardiovascular Disease

## 2021-02-22 VITALS — BP 134/74 | HR 63 | Ht 62.0 in | Wt 144.4 lb

## 2021-02-22 DIAGNOSIS — J42 Unspecified chronic bronchitis: Secondary | ICD-10-CM

## 2021-02-22 DIAGNOSIS — E119 Type 2 diabetes mellitus without complications: Secondary | ICD-10-CM | POA: Diagnosis not present

## 2021-02-22 DIAGNOSIS — Z950 Presence of cardiac pacemaker: Secondary | ICD-10-CM | POA: Diagnosis not present

## 2021-02-22 DIAGNOSIS — I4819 Other persistent atrial fibrillation: Secondary | ICD-10-CM

## 2021-02-22 DIAGNOSIS — I10 Essential (primary) hypertension: Secondary | ICD-10-CM | POA: Diagnosis not present

## 2021-02-22 DIAGNOSIS — I5032 Chronic diastolic (congestive) heart failure: Secondary | ICD-10-CM

## 2021-02-22 DIAGNOSIS — Q212 Atrioventricular septal defect, unspecified as to partial or complete: Secondary | ICD-10-CM

## 2021-02-22 DIAGNOSIS — Z794 Long term (current) use of insulin: Secondary | ICD-10-CM

## 2021-02-22 DIAGNOSIS — R0602 Shortness of breath: Secondary | ICD-10-CM

## 2021-02-22 DIAGNOSIS — Z7901 Long term (current) use of anticoagulants: Secondary | ICD-10-CM

## 2021-02-22 DIAGNOSIS — E785 Hyperlipidemia, unspecified: Secondary | ICD-10-CM

## 2021-02-22 NOTE — Progress Notes (Signed)
Patient ID: Mercedes Barr, female   DOB: Jan 04, 1946, 75 y.o.   MRN: [id number]    Cardiology Office Note    Date:  02/25/2021   ID:  Mercedes Barr, DOB 04/22/1946, MRN [id number]  PCP:  Rise Patience, DO  Cardiologist:   Sanda Klein, MD   Chief Complaint  Patient presents with   Pacemaker Check    History of Present Illness:  Mercedes Barr is a 75 y.o. female with complete heart block, pacemaker dependent, history of endocardial cushion defect repair, heart failure with preserved left ventricular systolic function.  This is her first in person visit since the COVID-19 epidemic.  She is doing well and has no cardiovascular complaints.  She has not had angina or dyspnea either at rest or with usual activity and denies edema, orthopnea, PND, palpitations, syncope, strokelike symptoms, claudication, major weight changes, falls.  She does become short of breath when she has to climb up stairs.  No problems on level ground.  Current pacemaker was implanted in 2018 and still has about 10 years of estimated longevity (Medtronic Adapta DR L).  Presenting rhythm is atrial flutter with complete heart block and ventricular paced rhythm.  Her device is programmed VVIR.  She has 99.6% ventricular pacing with a good heart rate histogram distribution.  She had 4 episodes of nonsustained ventricular tachycardia all of which occurred within about a 24 period in February (17-18th).  Has been compliant with remote pacemaker downloads.  She had surgical repair of atrioventricular cushion defect repair in 1988 when she was 75 years old. Surgery was complicated by development of complete heart block and she received a pacemaker (her initial device probably was an abdominal generator with epicardial leads). A right subclavian pacemaker was placed in 1991, but had to be removed because of complaints of pain. She had a complete revision of the system in 1999 and her current left subclavian atrial and ventricular  leads date to that time. Her most recent generator change out occurred in 2008. The atrial lead is a Medtronic [id number]. The ventricular lead is Medtronic [id number].  She underwent a pacemaker generator change out in 2018, with a Tyrx pouch.    Past Medical History:  Diagnosis Date   Arthritis 4/14   Tr TKR   Asthma    Complete heart block (HCC)    COPD (chronic obstructive pulmonary disease) ([place])    Coronary artery disease    Diabetes mellitus    GERD (gastroesophageal reflux disease)    Heart murmur    Hypertension    Hypothyroidism    Normal coronary arteries 2011   Pacemaker    PACEMAKER DEPENDENT-DR. LaMoure.  OFFICE NOTE DR. A. LITTLE STATES  "EXTREMELY PACEMAKER SENSITIVE AND WHEN YOU TRY TO CHECK FOR UNDERLYING RHYTHMS SHE WILL HAVE SNYCOPE AND WE HAVE NOT DONE THIS NOW IN ABOUT 2 YEARS".   Shortness of breath dyspnea    walking distance or climbing stairs    Past Surgical History:  Procedure Laterality Date   ATRIOVENTRICULAR CUSHION DEFECT REPAIR  1988   CARDIAC CATHETERIZATION  2011    SHOWED NO CAD-PER CARDIOLOGY OFFICE NOTES DR. A. LITTLE   colonscopy      removed polyps    INSERT / REPLACE / REMOVE PACEMAKER     PACEMAKER INSERTION  1988   last gen 11/08- MDT   PPM GENERATOR CHANGEOUT N/A 02/15/2017   Procedure: PPM Generator Changeout;  Surgeon: Sanda Klein, MD;  Location: [facility];  Service: Cardiovascular;  Laterality: N/A;   TOTAL KNEE ARTHROPLASTY Right 11/19/2012   Procedure: RIGHT TOTAL KNEE ARTHROPLASTY;  Surgeon: Gearlean Alf, MD;  Location: [facility];  Service: Orthopedics;  Laterality: Right;   TOTAL KNEE REVISION Right 09/24/2014   Procedure: RIGHT TOTAL KNEE ARTHROPLASTY REVISION;  Surgeon: Gearlean Alf, MD;  Location: [facility];  Service: Orthopedics;  Laterality: Right;   TUBAL LIGATION      Current Medications: Outpatient Medications Prior to Visit  Medication Sig Dispense Refill   acetaminophen  (TYLENOL) 325 MG tablet Take 650 mg by mouth every 6 (six) hours as needed for moderate pain (Right knee pain).     albuterol (PROAIR HFA) 108 (90 Base) MCG/ACT inhaler USE 2 PUFFS EVERY 4 HOURS  AS NEEDED FOR WHEEZING 51 g 1   albuterol (PROVENTIL) (2.5 MG/3ML) 0.083% nebulizer solution USE 1 VIAL VIA NEBULIZER  EVERY 4 HOURS AS NEEDED FOR WHEEZING 150 mL 0   Blood Glucose Monitoring Suppl (ONE TOUCH ULTRA 2) w/Device KIT Use to check blood sugar as directed. 1 each 0   cetirizine (ZYRTEC) 10 MG tablet Take 1 tablet (10 mg total) by mouth daily. 30 tablet 11   citalopram (CELEXA) 20 MG tablet TAKE 1 TABLET BY MOUTH IN  THE MORNING 90 tablet 3   EPIPEN 2-PAK 0.3 MG/0.3ML SOAJ injection Inject 0.3 mLs (0.3 mg  total) into the muscle as  needed for severe allergic  reaction, and contact  medical provider. 2 Device 0   famotidine (PEPCID) 20 MG tablet Take 1 tablet (20 mg total) by mouth 2 (two) times daily. 30 tablet 3   FLOVENT HFA 110 MCG/ACT inhaler USE 1 INHALATION BY MOUTH  TWICE DAILY 24 g 3   furosemide (LASIX) 20 MG tablet TAKE 1 TABLET BY MOUTH IN  THE MORNING 90 tablet 3   gabapentin (NEURONTIN) 100 MG capsule Take 1 capsule (100 mg total) by mouth at bedtime. 30 capsule 0   glucose blood (ONE TOUCH ULTRA TEST) test strip Use for blood sugar testing 1-3 times daily 100 each 12   HUMALOG KWIKPEN 100 UNIT/ML KwikPen INJECT SUBCUTANEOUSLY 10  UNITS TWICE DAILY 30 mL 3   insulin glargine (LANTUS SOLOSTAR) 100 UNIT/ML Solostar Pen Inject 15-20 Units into the skin 2 (two) times daily. Inject 20 units in the morning and 15 units in the evening. 45 mL 3   Insulin Pen Needle (B-D UF III MINI PEN NEEDLES) 31G X 5 MM MISC Use needle as needed to administer insulin. 200 each 2   Insulin Pen Needle (B-D ULTRAFINE III SHORT PEN) 31G X 8 MM MISC INJECT 10 UNITS INTO THE SKIN DAILY. 100 each 3   Lancets MISC Use with blood sugar monitor. 100 each 11   levothyroxine (SYNTHROID) 112 MCG tablet Take 1 tablet  (112 mcg total) by mouth every morning. 30 minutes before food 90 tablet 3   losartan (COZAAR) 25 MG tablet TAKE 1 TABLET BY MOUTH  DAILY 90 tablet 3   melatonin 3 MG TABS tablet Take 1 tablet (3 mg total) by mouth at bedtime. 30 tablet 0   metFORMIN (GLUCOPHAGE) 500 MG tablet TAKE 2 TABLETS BY MOUTH TWO TIMES DAILY WITH MEALS 360 tablet 4   methocarbamol (ROBAXIN) 500 MG tablet Take 1 tablet (500 mg total) by mouth every 6 (six) hours as needed for muscle spasms. 80 tablet 0   Omega-3 Fatty Acids (FISH OIL) 1000 MG CAPS Take 1 capsule  by mouth every morning. Reported on 02/11/2016     omeprazole (PRILOSEC) 20 MG capsule TAKE 1 CAPSULE BY MOUTH  TWICE DAILY 180 capsule 3   polyethylene glycol powder (GLYCOLAX/MIRALAX) powder TAKE 17 GRAMS BY MOUTH 2  TIMES DAILY AS NEEDED FOR  LAXATIVE 1054 g 0   potassium chloride SA (KLOR-CON) 20 MEQ tablet TAKE 1 TABLET BY MOUTH  DAILY 90 tablet 3   PRODIGY NO CODING BLOOD GLUC test strip USE THREE TIMES A DAY TO CHECK ON BLOOD GLUCOSE LEVEL 100 strip 12   propranolol (INDERAL) 40 MG tablet TAKE 1 TABLET BY MOUTH  DAILY 90 tablet 3   rosuvastatin (CRESTOR) 20 MG tablet Take 1 tablet (20 mg total) by mouth daily. 90 tablet 4   Spacer/Aero-Holding Chambers (AEROCHAMBER PLUS) inhaler Use as instructed 1 each 2   Travoprost, BAK Free, (TRAVATAN) 0.004 % SOLN ophthalmic solution Place 1 drop into both eyes at bedtime. 1 Bottle 5   traZODone (DESYREL) 50 MG tablet TAKE 1 TABLET BY MOUTH AT  BEDTIME AS NEEDED FOR SLEEP 60 tablet 1   XARELTO 20 MG TABS tablet TAKE 1 TABLET BY MOUTH  DAILY WITH SUPPER 90 tablet 3   No facility-administered medications prior to visit.     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    Patient enjoys walking around her neighborhood with her 2 neighbors.    Patient does not drive, however she takes SCAT to all her medical apts.    Social Determinants of Health   Financial Resource Strain: Not on file  Food Insecurity: Not on file  Transportation Needs: Not on file  Physical Activity: Not on file  Stress: Not on file  Social Connections: Not on file     Family History:  The patient's family history includes Cancer in her brother; Heart failure in her mother and sister; Hypertension in her mother; Kidney disease in her daughter; Stroke in her father.   ROS:   Please see the history of present illness.    ROS All other systems reviewed and are negative.   PHYSICAL EXAM:   VS:  BP 134/74   Pulse 63   Ht _0  (1.575 m)   Wt 144 lb 6.4 oz (65.5 kg)   SpO2 99%   BMI 26.41 kg/m      General: Alert, oriented x3, no distress, appears lean and fit and younger than her stated age.  Well-healed left subclavian pacemaker site Head: no evidence of trauma, PERRL, EOMI, no exophtalmos or lid lag, no myxedema, no xanthelasma; normal ears, nose and oropharynx Neck: normal jugular venous pulsations and no hepatojugular reflux; brisk carotid pulses without delay and no carotid bruits Chest: clear to auscultation, no signs of consolidation  by percussion or palpation, normal fremitus, symmetrical and full respiratory excursions Cardiovascular: normal position and quality of the apical impulse, regular rhythm, normal first and  paradoxically split second heart sounds, no murmurs, rubs or gallops Abdomen: no tenderness or distention, no masses by palpation, no abnormal pulsatility or arterial bruits, normal bowel sounds, no hepatosplenomegaly Extremities: no clubbing, cyanosis or edema; 2+ radial, ulnar and brachial pulses bilaterally; 2+ right femoral, posterior tibial and dorsalis pedis pulses; 2+ left femoral, posterior tibial and dorsalis pedis pulses; no subclavian or femoral bruits Neurological: grossly nonfocal Psych: Normal mood and affect   Wt Readings from Last 3 Encounters:  02/22/21 144 lb 6.4 oz (65.5 kg)  02/12/21 143 lb (64.9 kg)  05/22/20 147 lb 9.6 oz (67 kg)      Studies/Labs Reviewed:   EKG:  EKG is ordered today.  Personally reviewed, shows atypical atrial flutter/coarse atrial fibrillation with 100% ventricular pacing.  The QTC is 460 ms.  Recent Labs: 02/12/2021: BUN 15; Creatinine, Ser 0.84; Potassium 4.6; Sodium 139; TSH 0.561   Lipid Panel    Component Value Date/Time   CHOL 87 (L) 02/12/2021 1159   TRIG 114 02/12/2021 1159   HDL 31 (L) 02/12/2021 1159   CHOLHDL 2.8 02/12/2021 1159   CHOLHDL 2.6 04/04/2016 1415   VLDL 29 04/04/2016 1415   LDLCALC 35 02/12/2021 1159   LDLDIRECT 56 06/08/2012 1120     ASSESSMENT:    1. Persistent atrial fibrillation ([place])   2. Long term (current) use of anticoagulants   3. Chronic diastolic heart failure ([place])   4. Pacemaker   5. Dyslipidemia (high LDL; low HDL)   6. Type 2 diabetes mellitus without complication, with long-term current use of insulin ([place])   7. Essential hypertension   8. Chronic bronchitis, unspecified chronic bronchitis type ([place])   9. Shortness of breath   10. Endocardial cushion defect s/p surgical repair      PLAN:  In order of problems listed above:  AFib: Longstanding persistent, probably permanent.  CHADSVasc 5 (age, gender, heart failure, diabetes mellitus, hypertension).  No strokes or TIAs to date. Eliquis  is well-tolerated, without bleeding problems CHF: Clinically euvolemic on a tiny dose of loop diuretic.  NYHA functional class II.  Recheck her echocardiogram.  Unfortunately, we will not be able to fully interpret diastolic function/filling pressures due to atrial arrhythmia. CHB: Pacemaker dependent, does not like threshold testing of her ventricular lead. PPM: Generator change July 2018. Normal device function.  Has tolerated remote downloads over the last few years, although in the past these caused her great anxiety. HLP: LDL cholesterol in target range.  Still has a rather low HDL.  She is quite lean however. DM: Glycemic control is not perfect.  On insulin.  Most recent hemoglobin A1c was 8.4%. HTN: Very close to target range COPD: Alternative explanation for her shortness of breath. History of congenital heart disease: Last echocardiogram was performed in 2014.  At that time she had mild centrally directed mitral insufficiency and normal LVEF 55-60%, also had moderate tricuspid insufficiency.     Medication Adjustments/Labs and Tests Ordered: Current medicines are reviewed at length with the patient today.  Concerns regarding medicines are outlined above.  Medication changes, Labs and Tests ordered today are listed in the Patient Instructions below. Patient Instructions  Medication Instructions:  No changes *If you need a refill on your cardiac medications before your next appointment, please call your pharmacy*   Lab Work: None ordered If you have  labs (blood work) drawn today and your tests are completely normal, you will receive your results only by: [place] (if you have MyChart) OR A paper copy in the mail If you have any lab test that is abnormal or we need to change your treatment, we will call you to review the results.   Testing/Procedures: Your physician has requested that you have an echocardiogram. Echocardiography is a painless test that uses sound waves to  create images of your heart. It provides your doctor with information about the size and shape of your heart and how well your heart's chambers and valves are working. You may receive an ultrasound enhancing agent through an IV if needed to better visualize your heart during the echo.This procedure takes approximately one hour. There are no restrictions for this procedure. This will take place at the [address].   Follow-Up: At [facility], you and your health needs are our priority.  As part of our continuing mission to provide you with exceptional heart care, we have created designated Provider Care Teams.  These Care Teams include your primary Cardiologist (physician) and Advanced Practice Providers (APPs -  Physician Assistants and Nurse Practitioners) who all work together to provide you with the care you need, when you need it.  We recommend signing up for the patient portal called "MyChart".  Sign up information is provided on this After Visit Summary.  MyChart is used to connect with patients for Virtual Visits (Telemedicine).  Patients are able to view lab/test results, encounter notes, upcoming appointments, etc.  Non-urgent messages can be sent to your provider as well.   To learn more about what you can do with MyChart, go to [email or web address].    Your next appointment:   12 month(s) on a device day  The format for your next appointment:   In Person  Provider:   Sanda Klein, MD    Signed, Sanda Klein, MD  02/25/2021 [facility] [place], [place], [place]  [address] Phone: [phone]; Fax: [phone]

## 2021-02-22 NOTE — Patient Instructions (Signed)
Medication Instructions:  No changes *If you need a refill on your cardiac medications before your next appointment, please call your pharmacy*   Lab Work: None ordered If you have labs (blood work) drawn today and your tests are completely normal, you will receive your results only by: [place] (if you have MyChart) OR A paper copy in the mail If you have any lab test that is abnormal or we need to change your treatment, we will call you to review the results.   Testing/Procedures: Your physician has requested that you have an echocardiogram. Echocardiography is a painless test that uses sound waves to create images of your heart. It provides your doctor with information about the size and shape of your heart and how well your heart's chambers and valves are working. You may receive an ultrasound enhancing agent through an IV if needed to better visualize your heart during the echo.This procedure takes approximately one hour. There are no restrictions for this procedure. This will take place at the [address]   Follow-Up: At [facility], you and your health needs are our priority.  As part of our continuing mission to provide you with exceptional heart care, we have created designated Provider Care Teams.  These Care Teams include your primary Cardiologist (physician) and Advanced Practice Providers (APPs -  Physician Assistants and Nurse Practitioners) who all work together to provide you with the care you need, when you need it.  We recommend signing up for the patient portal called "MyChart".  Sign up information is provided on this After Visit Summary.  MyChart is used to connect with patients for Virtual Visits (Telemedicine).  Patients are able to view lab/test results, encounter notes, upcoming appointments, etc.  Non-urgent messages can be sent to your provider as well.   To learn more about what you can do with MyChart, go to [email or web address].     Your next appointment:   12 month(s) on a device day  The format for your next appointment:   In Person  Provider:   Sanda Klein, MD

## 2021-02-25 ENCOUNTER — Encounter: Payer: Self-pay | Admitting: Cardiovascular Disease

## 2021-03-02 ENCOUNTER — Other Ambulatory Visit: Payer: Self-pay | Admitting: Family Medicine

## 2021-03-02 DIAGNOSIS — Z1231 Encounter for screening mammogram for malignant neoplasm of breast: Secondary | ICD-10-CM

## 2021-03-08 ENCOUNTER — Other Ambulatory Visit: Payer: Self-pay | Admitting: Family Medicine

## 2021-03-08 DIAGNOSIS — Z1231 Encounter for screening mammogram for malignant neoplasm of breast: Secondary | ICD-10-CM

## 2021-03-10 ENCOUNTER — Other Ambulatory Visit: Payer: Self-pay

## 2021-03-10 ENCOUNTER — Ambulatory Visit
Admission: RE | Admit: 2021-03-10 | Discharge: 2021-03-10 | Disposition: A | Discharge status: Home / Self Care | Payer: Medicare Other | Source: Ambulatory Visit | Attending: Family Medicine | Admitting: Family Medicine

## 2021-03-10 DIAGNOSIS — Z1231 Encounter for screening mammogram for malignant neoplasm of breast: Secondary | ICD-10-CM | POA: Diagnosis not present

## 2021-03-12 NOTE — Progress Notes (Signed)
Remote pacemaker transmission.   

## 2021-03-15 ENCOUNTER — Other Ambulatory Visit: Payer: Self-pay | Admitting: Family Medicine

## 2021-03-15 ENCOUNTER — Other Ambulatory Visit: Payer: Self-pay

## 2021-03-15 ENCOUNTER — Other Ambulatory Visit (HOSPITAL_COMMUNITY): Payer: Medicare Other

## 2021-03-15 DIAGNOSIS — G4700 Insomnia, unspecified: Secondary | ICD-10-CM

## 2021-03-16 ENCOUNTER — Other Ambulatory Visit: Payer: Self-pay

## 2021-03-16 ENCOUNTER — Ambulatory Visit (HOSPITAL_COMMUNITY): Payer: Medicare Other | Attending: Internal Medicine

## 2021-03-16 DIAGNOSIS — R0602 Shortness of breath: Secondary | ICD-10-CM | POA: Insufficient documentation

## 2021-03-16 LAB — ECHOCARDIOGRAM COMPLETE
Area-P 1/2: 4.71 cm2
MV VTI: 1.86 cm2
S' Lateral: 3 cm

## 2021-03-16 MED ORDER — PERFLUTREN LIPID MICROSPHERE
1.0000 mL | INTRAVENOUS | Status: AC | PRN
  Administered 2021-03-16: 1 mL via INTRAVENOUS

## 2021-03-17 ENCOUNTER — Telehealth (HOSPITAL_COMMUNITY): Payer: Self-pay | Admitting: Cardiovascular Disease

## 2021-03-17 NOTE — Telephone Encounter (Signed)
  Mercedes Klein, MD  03/16/2021  3:43 PM EDT      Good news on echo - normal findings for her    The patient has been notified of the result and verbalized understanding.  All questions (if any) were answered.

## 2021-03-17 NOTE — Telephone Encounter (Signed)
Patient received a call yesterday and called my extension back later yesterday afternoon and LVM. Patient called this am for results. Will someone please call patient with results of test. Thank you.

## 2021-03-24 ENCOUNTER — Other Ambulatory Visit: Payer: Self-pay | Admitting: Family Medicine

## 2021-03-24 DIAGNOSIS — R1013 Epigastric pain: Secondary | ICD-10-CM

## 2021-04-05 ENCOUNTER — Other Ambulatory Visit: Payer: Self-pay | Admitting: Family Medicine

## 2021-04-05 DIAGNOSIS — R1013 Epigastric pain: Secondary | ICD-10-CM

## 2021-04-08 ENCOUNTER — Other Ambulatory Visit: Payer: Self-pay | Admitting: Family Medicine

## 2021-04-08 DIAGNOSIS — G4700 Insomnia, unspecified: Secondary | ICD-10-CM

## 2021-04-19 ENCOUNTER — Other Ambulatory Visit: Payer: Self-pay

## 2021-04-19 DIAGNOSIS — R1013 Epigastric pain: Secondary | ICD-10-CM

## 2021-04-19 MED ORDER — FAMOTIDINE 20 MG PO TABS: 20.0000 mg | ORAL_TABLET | Freq: Two times a day (BID) | ORAL | 3 refills | Status: DC

## 2021-04-19 NOTE — Telephone Encounter (Signed)
Patient calls nurse line regarding refill on famotidine. Patient is taking medication twice daily, however, rx quantity was 30, giving patient 15 day supply. Per insurance guidelines, they need an updated rx with one month supply. I have pended medication with 60 tablets for a one month supply.   Please advise.   Talbot Grumbling, RN

## 2021-04-23 ENCOUNTER — Other Ambulatory Visit: Payer: Self-pay | Admitting: Family Medicine

## 2021-04-23 ENCOUNTER — Ambulatory Visit: Payer: Medicare Other

## 2021-04-30 ENCOUNTER — Other Ambulatory Visit: Payer: Self-pay | Admitting: Family Medicine

## 2021-04-30 DIAGNOSIS — G4700 Insomnia, unspecified: Secondary | ICD-10-CM

## 2021-04-30 DIAGNOSIS — R1013 Epigastric pain: Secondary | ICD-10-CM

## 2021-05-19 ENCOUNTER — Ambulatory Visit (INDEPENDENT_AMBULATORY_CARE_PROVIDER_SITE_OTHER): Payer: Medicare Other

## 2021-05-19 DIAGNOSIS — I442 Atrioventricular block, complete: Secondary | ICD-10-CM

## 2021-05-20 ENCOUNTER — Other Ambulatory Visit: Payer: Self-pay

## 2021-05-20 LAB — CUP PACEART REMOTE DEVICE CHECK
Battery Impedance: 257 Ohm
Battery Remaining Longevity: 120 mo
Battery Voltage: 2.77 V
Brady Statistic RV Percent Paced: 100 %
Date Time Interrogation Session: 20221013140111
Implantable Lead Implant Date: 19990203
Implantable Lead Implant Date: 19990203
Implantable Lead Location: 753859
Implantable Lead Location: 753860
Implantable Lead Model: 5068
Implantable Lead Model: 5092
Implantable Pulse Generator Implant Date: 20180711
Lead Channel Impedance Value: 651 Ohm
Lead Channel Impedance Value: 67 Ohm
Lead Channel Pacing Threshold Amplitude: 0.875 V
Lead Channel Pacing Threshold Pulse Width: 0.4 ms
Lead Channel Setting Pacing Amplitude: 2.5 V
Lead Channel Setting Pacing Pulse Width: 0.4 ms
Lead Channel Setting Sensing Sensitivity: 4 mV

## 2021-05-26 ENCOUNTER — Other Ambulatory Visit: Payer: Self-pay

## 2021-05-26 ENCOUNTER — Encounter: Payer: Self-pay | Admitting: Family Medicine

## 2021-05-26 ENCOUNTER — Ambulatory Visit (INDEPENDENT_AMBULATORY_CARE_PROVIDER_SITE_OTHER): Payer: Medicare Other

## 2021-05-26 ENCOUNTER — Ambulatory Visit (INDEPENDENT_AMBULATORY_CARE_PROVIDER_SITE_OTHER): Payer: Medicare Other | Admitting: Family Medicine

## 2021-05-26 VITALS — BP 126/56 | Ht 62.0 in | Wt 148.2 lb

## 2021-05-26 DIAGNOSIS — Z23 Encounter for immunization: Secondary | ICD-10-CM

## 2021-05-26 DIAGNOSIS — Z794 Long term (current) use of insulin: Secondary | ICD-10-CM

## 2021-05-26 DIAGNOSIS — F4321 Adjustment disorder with depressed mood: Secondary | ICD-10-CM | POA: Diagnosis not present

## 2021-05-26 DIAGNOSIS — E119 Type 2 diabetes mellitus without complications: Secondary | ICD-10-CM

## 2021-05-26 LAB — POCT GLYCOSYLATED HEMOGLOBIN (HGB A1C): HbA1c, POC (controlled diabetic range): 9.7 % — AB (ref 0.0–7.0)

## 2021-05-26 MED ORDER — LANTUS SOLOSTAR 100 UNIT/ML ~~LOC~~ SOPN: 40.0000 [IU] | PEN_INJECTOR | Freq: Every day | SUBCUTANEOUS | 3 refills | Status: DC

## 2021-05-26 NOTE — Patient Instructions (Addendum)
Prediabetes, A1c was up today at 9.7.  You talked with Dr. Georgina Peer, our pharmacist, who will set up an appointment with you for discussing your diabetic medications further and making adjustments.  We also placed a continuous glucose monitor today. I have also put in a referral to nutritionist, you will need to call Dr. Jenne Campus to get an appointment scheduled.   I am so sorry for everything you are going through recently, you are a very strong person and we are here if you need anything further.  I am putting some counseling resources below if you need any extra help but you are always able to come talk to me as well.       Therapy and Counseling Resources Most providers on this list will take Medicaid. Patients with commercial insurance or Medicare should contact their insurance company to get a list of in network providers.  [facility]:Psychiatry and Counseling [address] [place], [place] [address] [place], [place], [place] [address]      [place] [address]  [place], [place] [address] [phone]  [place] [address], [place]  [place], [place] [address]       [phone]     [facility] (virtual only) [phone]  [facility] [address] [place], [place]  [facility]:  [place]. [place] [phone]  [facility]:  [place] [address] [phone]  [facility] (under & uninsured) [address] [place] [phone]    [email or web address]    [place] [address]  [place]    [phone]  [facility] [place]     Phone:  [phone]     [place]  [place] [address]      [place], [place] ext [place]: [place], [place], [place]   [place] ([place] therapist) [email or web address]  [place] [address]   [place] [address]    [phone]    [facility]   [address],  [place], [place]   [place]  [place]  [facility]  [address] [place], [place]        [phone]  Open Access/Walk In Clinic under & uninsured  [facility]  [address] [employer], [place] Crisis [phone]  [facility] the [place],  ([place])   [place], [place]: [phone]) 8:30 - 12; 1 - 2:30  [facility] of the [place],  [place], [place]    ([phone]):8:30 - 12; 2 - 3PM  [facility] [employer],  [address]  [place]; [phone]):   Mon - Fri 8 AM - 5 PM  [facility] [place]  MWF 12:30 to 3:00 or call to schedule an appointment  [phone]  Specific Provider options Psychology Today  [email or web address] click on find a therapist  enter your zip code left side and select or tailor a therapist for your specific need.   [facility] Provider Directory [email or web address]  (  Medicaid)   Follow all drop down to find a provider  [place] or [email or web address] [address], [place], [place] Recovery support and educational   24- Hour Availability:   [facility]  [address] [place], [place] Crisis [phone]  [facility] the [employer] [phone]  [place]  [phone]   [place]  [phone] (after  hours)  Therapeutic Alternative/Mobile Crisis   [phone]  [place] National Suicide Hotline  9397174598 [place] Mon[place])  Call 911 or go to emergency room  [facility]  [phone]);  [place] and [employer]  [phone]); [place], [place], [place], [place], [place], [place], [place]

## 2021-05-26 NOTE — Progress Notes (Signed)
    SUBJECTIVE:   CHIEF COMPLAINT / HPI:   Patient presents for a diabetes follow-up.  She notes that she recently had her sister passed away this last month and has been dealing with grief/depression that has affected her eating and she expects it to be higher than usual.  She has been compliant with taking her insulin medications and is taking Lantus 40 units at night and Humalog 10 units twice daily.  She is interested in doing the continuous glucose monitoring as well as talking with a nutritionist to figure out how to improve her diabetes.  Regarding her grief, she reports that she is doing okay she does have her family to talk to her and her grandson helps her pull-through most of the days.  She states the days that her hardest or when she is home alone as she thinks about her sister constantly.  She notes that they were extremely close and called each other every other day.  Patient has been through similar things before when her daughter was brutally murdered several years ago.  She feels like she knows what to expect and is doing okay right now, though she does note that it is very hard.  PERTINENT  PMH / PSH: Reviewed  OBJECTIVE:   BP (!) 126/56   Ht 5\' 2"  (1.575 m)   Wt 148 lb 4 oz (67.2 kg)   BMI 27.12 kg/m   General: NAD, well-appearing, well-nourished Respiratory: No respiratory distress, breathing comfortably, able to speak in full sentences Skin: warm and dry, no rashes noted on exposed skin Psych: Appropriate affect and mood, appropriately tearful during conversation about her grief  ASSESSMENT/PLAN:   DM (diabetes mellitus), type 2 (HCC) A1c elevated to 9.7, currently on metformin 100mg  BID, Rosuvastatin 20mg , Lantus 20U in the am 15U in the pm, Humalog 10U BID. Recently had overeating in the setting of grief reaction/depression.  Discussed with pharmacist Dr. Georgina Peer, who saw the patient while in the clinic and placed on glucose monitoring patch and set her up for an  appointment the following Monday.  As patient is going to follow-up with pharmacy, will not adjust medications today. -Patient to follow-up with pharmacy team regarding diabetes control - Nutrition referral to Dr. Jenne Campus placed and patient given contact information - Follow-up in 3 months for A1c check    Grief reaction/depression PHQ-9 elevated to 12 in the setting of grief reaction as her sister, whom she was very close with passed away a month ago after having been in the ICU due to being found down after seizures.  Patient feels that she is doing okay with her grief, though she is struggling, and has protective factors of her family and grandchild who she helps take care of.  Provided patient with counseling resources, and encouragement to come back sooner if wanting to talk further.   Rise Patience, [place]

## 2021-05-26 NOTE — Assessment & Plan Note (Addendum)
A1c elevated to 9.7, currently on metformin 100mg  BID, Rosuvastatin 20mg , Lantus 20U in the am 15U in the pm, Humalog 10U BID. Recently had overeating in the setting of grief reaction/depression.  Discussed with pharmacist Dr. Georgina Peer, who saw the patient while in the clinic and placed on glucose monitoring patch and set her up for an appointment the following Monday.  As patient is going to follow-up with pharmacy, will not adjust medications today. -Patient to follow-up with pharmacy team regarding diabetes control - Nutrition referral to Dr. Jenne Campus placed and patient given contact information - Follow-up in 3 months for A1c check

## 2021-05-27 DIAGNOSIS — E119 Type 2 diabetes mellitus without complications: Secondary | ICD-10-CM | POA: Diagnosis not present

## 2021-05-27 NOTE — Progress Notes (Signed)
Remote pacemaker transmission.   

## 2021-06-07 ENCOUNTER — Telehealth: Payer: Medicare Other | Admitting: Pharmacist

## 2021-06-09 ENCOUNTER — Ambulatory Visit: Payer: Medicare Other | Admitting: Pharmacist

## 2021-06-11 ENCOUNTER — Other Ambulatory Visit: Payer: Self-pay | Admitting: Family Medicine

## 2021-06-11 DIAGNOSIS — I5030 Unspecified diastolic (congestive) heart failure: Secondary | ICD-10-CM

## 2021-06-11 DIAGNOSIS — I10 Essential (primary) hypertension: Secondary | ICD-10-CM

## 2021-06-15 NOTE — Progress Notes (Signed)
Subjective:    Patient ID: Mercedes Barr, female    DOB: 1945-08-24, 75 y.o.   MRN: [id number]  HPI Patient is a 75 y.o. female who presents for diabetes management and Libre 2.0 application. She is in good spirits and presents without assistance. Patient was referred and last seen by Primary Care Provider on 05/26/21.  Patient reports diabetes was diagnosed in 2002.   Insurance coverage/medication affordability: UHC Medicare/Medicaid  Family/Social history: lives with her daughter and her husband  Current diabetes medications include: metformin 1063m BID, Lantus 40 units at night, Humalog 12 units BID Patient states that She is taking her medications as prescribed. Patient reports adherence with medications.  Do you feel that your medications are working for you?  yes  Have you been experiencing any side effects to the medications prescribed? Yes; patient states metformin makes her stomach hurt and she has diarrhea  Do you have any problems obtaining medications due to transportation or finances?  no    Patient denies hypoglycemic events. Patient denies polyuria (increased urination).  Patient denies polyphagia (increased appetite).  Patient denies polydipsia (increased thirst).  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 Patient reports self foot exams.   Patient reported: Home fasting blood sugars: 200's; lowest was 184  2 hour post-meal/random blood sugars: 200's  Freestyle Libre 2.0 patient education Person(s)instructed: patient  Patient taking >500 mg Vitamin C: denies Reminded patient to not take Vitamin C with [employer]  Instruction: [employer] 14-day Freestyle Libre patient education  CGM overview and set-up 1. Button, touch screen, and icons 2. Power supply and recharging 3. Home screen 4. Date and time 5. Set BG target range: 80-250 mg/dL 6. Set alarm/alert tone  7. Interstitial vs. capillary blood glucose readings   8. When to verify sensor reading with fingerstick blood glucose  Sensor application -- sensor placed on left arm 1. Site selection and site prep with alcohol pad/Skin Tac 2. Sensor prep-sensor pack and sensor applicator 3. Starting the sensor: 1 hour warm up before BG readings available     Will ask for fingersticks the first 12 hours   4. Sensor change every 14 days and rotate site 5. Call [employer] customer service if sensor comes off before 14 days  Safety and Troubleshooting 1. Scan the sensor at least every 8 hours 2. When the "test BG" symbol appears, test fingerstick blood glucose prior to    making treatment decisions 3. Do a fingerstick blood glucose test if the sensor readings do not match how    you feel 4. Remove sensor prior for MRI or CT. Sensor may be damaged by exposure to    airport x-ray screening 5. Vitamin C may cause false high readings and aspirin may cause false low     readings 6. Store sensor kit between 39 and 77 degrees. Can be refrigerated at this temp.  Contact information provided for APraxaircustomer service and/or trainer.  Objective:   Labs:   Physical Exam Neurological:     Mental Status: She is alert and oriented to person, place, and time.    Review of Systems  Gastrointestinal:  Positive for abdominal pain and diarrhea. Negative for nausea and vomiting.   Lab Results  Component Value Date   HGBA1C 9.7 (A) 05/26/2021   HGBA1C 8.7 (A) 02/12/2021   HGBA1C 8.4 (A) 05/22/2020   Lipid Panel     Component Value Date/Time   CHOL 87 (L) 02/12/2021 1159   TRIG  114 02/12/2021 1159   HDL 31 (L) 02/12/2021 1159   CHOLHDL 2.8 02/12/2021 1159   CHOLHDL 2.6 04/04/2016 1415   VLDL 29 04/04/2016 1415   LDLCALC 35 02/12/2021 1159   LDLDIRECT 56 06/08/2012 1120   Assessment/Plan:   T2DM is not controlled likely due to patient needing further adjustment to regimen. Medication adherence appears optimal. Will have patient discontinue metformin  1063m BID and switch to metformin extended release 7530monce daily with plan to titrate to BID as tolerated. Will also increase patient's Humalog from 10 units to 12 units twice daily and instructed to take BEFORE meal. Will discuss further options pending information obtained from CGM data. Following instruction patient verbalized understanding of treatment plan.    metformin 100033mID, Lantus 40 units at night, Humalog 12 units BID  Continued basal insulin Lantus 40 units once daily at night Increased dose of  rapid insulin Humalog to 12 units BID before meals  Discontinued metformin 500m54mtablets BID and switched to metformin XR 750mg74me daily Extensively discussed pathophysiology of diabetes, dietary effects on blood sugar control, and recommended lifestyle interventions. Counseled on s/sx of and management of hypoglycemia Next A1C anticipated January 2023.   Follow-up appointment three weeks to review sugar readings. Written patient instructions provided.  This appointment required 40 minutes of direct patient care.  Thank you for involving pharmacy to assist in providing this patient's care.

## 2021-06-16 ENCOUNTER — Ambulatory Visit (INDEPENDENT_AMBULATORY_CARE_PROVIDER_SITE_OTHER): Payer: Medicare Other | Admitting: Pharmacist

## 2021-06-16 ENCOUNTER — Other Ambulatory Visit: Payer: Self-pay

## 2021-06-16 DIAGNOSIS — E119 Type 2 diabetes mellitus without complications: Secondary | ICD-10-CM | POA: Diagnosis not present

## 2021-06-16 DIAGNOSIS — Z794 Long term (current) use of insulin: Secondary | ICD-10-CM

## 2021-06-16 MED ORDER — METFORMIN HCL ER 750 MG PO TB24
750.0000 mg | ORAL_TABLET | Freq: Every day | ORAL | 0 refills | Status: DC
Start: 2021-06-16 — End: 2021-07-07

## 2021-06-16 MED ORDER — INSULIN LISPRO (1 UNIT DIAL) 100 UNIT/ML (KWIKPEN)
12.0000 [IU] | PEN_INJECTOR | Freq: Two times a day (BID) | SUBCUTANEOUS | 11 refills | Status: DC
Start: 2021-06-16 — End: 2021-09-06

## 2021-06-16 NOTE — Patient Instructions (Addendum)
Miss Zeiser it was a pleasure seeing you today.   The sensor is small waterproof disc that is placed on the back of the upper arm.  There is a very thin filament that is inserted under the surface of the skin and measures the amount of glucose in the interstitial fluid.  This system collects your sugar levels for up to 14 days and it automatically records the glucose level every 15 minutes. This will show your provider any patterns in your glucose levels.  Please remember... 1. Sensor will last 14 days 2. Sensor should be applied to area away from scarring, tattoos, irritation, and bones. 3. Starting the sensor: 1 hour warm up before BG readings available   4. Scan the sensor at least every 8 hours 5. Hold reader within 1.5 inches of sensor to scan 6. When the blood drop and magnifying glass symbol appears, test fingerstick blood glucose prior to making treatment decisions 7. Do a fingerstick blood glucose test if the sensor readings do not match how you feel 8. Remove sensor prior to magnetic resonance imaging (MRI), computed tomography (CT) scan, or high-frequency electrical heat (diathermy) treatment. 9. Freestyle Libre may be worn through a Environmental education officer. It may not be exposed to an advanced Imaging Technology (AIT) body scanner (also called a millimeter wave scanner) or the baggage x-ray machine. Instead, ask for hand-wanding or full-body pat-down and visual inspection.  10. Doses of vitamin C (ascorbic acid) >500 mg every day may cause false high readings. 11. Do not submerge more than 3 feet or keep underwater longer than 30 minutes at a time. Gently pat to dry.  12. Store sensor kit between 39 and 77 degrees Farenheit. Can be refrigerated within this temperature range.  Problems with Freestyle Libre sticking? 1. Order Tegaderm I.V. films to place directly over [facility] sensor on arm. 2. May also order Skin Tac from [facility]. Alcohol swab area you plan to administer  Freestyle Libre then let dry. Once dry, apply Skin Tac in a circular motion (with a spot in the middle for sensor without skin tac) and let dry. Once dry you can apply Freestyle Libre   Problems taking off Freestyle [place]? 1. Remember to try to shower before removing Freestyle Libre 2. Order Tac Away to help remove any extra adhesive left on your skin once you remove Freestyle Libre 3. May also try baby oil to loosen adhesive  [place] Phone number: [phone] Available 7 days a week; excluding holidays 8 AM to 8PM EST  [email or web address]  Please do the following:  Increase your Humalog to 12 units twice a day BEFORE meals as directed today during your appointment. If you have any questions or if you believe something has occurred because of this change, call me or your doctor to let one of [place] know.  Stop your old metformin and start the new metformin. You will just take one tablet a day with breakfast Continue checking blood sugars at home. It's really important that you record these and bring these in to your next doctor's appointment.  Continue making the lifestyle changes we've discussed together during our visit. Diet and exercise play a significant role in improving your blood sugars.  Follow-up with me in three weeks.   Hypoglycemia or low blood sugar:   Low blood sugar can happen quickly and may become an emergency if not treated right away.   While this shouldn't happen often, it can be brought upon if you skip  a meal or do not eat enough. Also, if your insulin or other diabetes medications are dosed too high, this can cause your blood sugar to go to low.   Warning signs of low blood sugar include: Feeling shaky or dizzy Feeling weak or tired  Excessive hunger Feeling anxious or upset  Sweating even when you aren't exercising  What to do if I experience low blood sugar? Follow the Rule of 15 Check your blood sugar. If lower than 70, proceed to step  2.  Treat with 15 grams of fast acting carbs which is found in 3-4 glucose tablets. If none are available you can try hard candy, 1 tablespoon of sugar or honey,4 ounces of fruit juice, or 6 ounces of REGULAR soda.  Re-check your sugar in 15 minutes. If it is still below 70, do what you did in step 2 again. If your blood sugar has come back up, go ahead and eat a snack or small meal made up of complex carbs (ex. Whole grains) and protein at this time to avoid recurrence of low blood sugar.

## 2021-06-16 NOTE — Assessment & Plan Note (Signed)
T2DM is not controlled likely due to patient needing further adjustment to regimen. Medication adherence appears optimal. Will have patient discontinue metformin 1000mg  BID and switch to metformin extended release 750mg  once daily with plan to titrate to BID as tolerated. Will also increase patient's Humalog from 10 units to 12 units twice daily and instructed to take BEFORE meal. Will discuss further options pending information obtained from CGM data. Following instruction patient verbalized understanding of treatment plan.    metformin 1000mg  BID, Lantus 40 units at night, Humalog 12 units BID  1. Continued basal insulin Lantus 40 units once daily at night 2. Increased dose of  rapid insulin Humalog to 12 units BID before meals  3. Discontinued metformin 500mg  2 tablets BID and switched to metformin XR 750mg  once daily 4. Extensively discussed pathophysiology of diabetes, dietary effects on blood sugar control, and recommended lifestyle interventions. 5. Counseled on s/sx of and management of hypoglycemia 6. Next A1C anticipated January 2023.   Follow-up appointment three weeks to review sugar readings.

## 2021-06-17 ENCOUNTER — Other Ambulatory Visit: Payer: Self-pay

## 2021-06-26 DIAGNOSIS — E119 Type 2 diabetes mellitus without complications: Secondary | ICD-10-CM | POA: Diagnosis not present

## 2021-06-29 ENCOUNTER — Other Ambulatory Visit: Payer: Self-pay

## 2021-06-29 DIAGNOSIS — R1013 Epigastric pain: Secondary | ICD-10-CM

## 2021-06-29 MED ORDER — FAMOTIDINE 20 MG PO TABS
20.0000 mg | ORAL_TABLET | Freq: Two times a day (BID) | ORAL | 1 refills | Status: DC
Start: 2021-06-29 — End: 2021-11-09

## 2021-07-06 ENCOUNTER — Other Ambulatory Visit: Payer: Self-pay | Admitting: Family Medicine

## 2021-07-07 ENCOUNTER — Other Ambulatory Visit: Payer: Self-pay

## 2021-07-07 ENCOUNTER — Ambulatory Visit (INDEPENDENT_AMBULATORY_CARE_PROVIDER_SITE_OTHER): Payer: Medicare Other | Admitting: Pharmacist

## 2021-07-07 DIAGNOSIS — E119 Type 2 diabetes mellitus without complications: Secondary | ICD-10-CM

## 2021-07-07 DIAGNOSIS — Z794 Long term (current) use of insulin: Secondary | ICD-10-CM

## 2021-07-07 MED ORDER — LANCETS MISC: 11 refills | Status: AC

## 2021-07-07 MED ORDER — METFORMIN HCL ER 750 MG PO TB24: 750.0000 mg | ORAL_TABLET | Freq: Every day | ORAL | 0 refills | Status: DC

## 2021-07-07 NOTE — Progress Notes (Signed)
Subjective:    Patient ID: Mercedes Barr, female    DOB: [birth year], 75 y.o.   MRN: [id number]  HPI Patient is a 75 y.o. female who presents for diabetes management and Libre 2.0 application. She is in good spirits and presents without assistance. Patient was referred and last seen by Primary Care Provider on 05/26/21.  Patient reports her blood glucose readings have been a little high due to delay in receiving metformin XR 719m as well as drinking more gingerale lately due to having a cold and not feeling well.  Patient reports diabetes was diagnosed in 2002.   Insurance coverage/medication affordability: UHC Medicare/Medicaid  Family/Social history: lives with her daughter and her husband  Current diabetes medications include: metformin 10036mBID, Lantus 40 units at night, Humalog 12 units BID Patient states that She is taking her medications as prescribed. Patient reports adherence with medications.  Do you feel that your medications are working for you?  yes  Have you been experiencing any side effects to the medications prescribed? no  Do you have any problems obtaining medications due to transportation or finances?  no    Patient denies hypoglycemic events. Patient denies polyuria (increased urination).  Patient denies polyphagia (increased appetite).  Patient reports polydipsia (increased thirst).  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 Patient reports self foot exams.   Freestyle Libre 2.0 patient education Person(s)instructed: patient  Patient taking >500 mg Vitamin C: denies Reminded patient to not take Vitamin C with Freestyle Libre.  Instruction: [employer] 14-day Freestyle Libre patient education  CGM overview and set-up 1. Button, touch screen, and icons 2. Power supply and recharging 3. Home screen 4. Date and time 5. Set BG target range: 80-250 mg/dL 6. Set alarm/alert tone  7. Interstitial vs. capillary blood  glucose readings  8. When to verify sensor reading with fingerstick blood glucose  Sensor application -- sensor placed on left arm 1. Site selection and site prep with alcohol pad/Skin Tac 2. Sensor prep-sensor pack and sensor applicator 3. Starting the sensor: 1 hour warm up before BG readings available     Will ask for fingersticks the first 12 hours   4. Sensor change every 14 days and rotate site 5. Call [employer] customer service if sensor comes off before 14 days  Safety and Troubleshooting 1. Scan the sensor at least every 8 hours 2. When the "test BG" symbol appears, test fingerstick blood glucose prior to    making treatment decisions 3. Do a fingerstick blood glucose test if the sensor readings do not match how    you feel 4. Remove sensor prior for MRI or CT. Sensor may be damaged by exposure to    airport x-ray screening 5. Vitamin C may cause false high readings and aspirin may cause false low     readings 6. Store sensor kit between 39 and 77 degrees. Can be refrigerated at this temp.  Contact information provided for AbPraxairustomer service and/or trainer.  Objective:   CGM data last 14 days:   Labs:   Physical Exam Neurological:     Mental Status: She is alert and oriented to person, place, and time.    Review of Systems  Gastrointestinal:  Positive for abdominal pain and diarrhea. Negative for nausea and vomiting.   Lab Results  Component Value Date   HGBA1C 9.7 (A) 05/26/2021   HGBA1C 8.7 (A) 02/12/2021   HGBA1C 8.4 (A) 05/22/2020   Lipid Panel  Component Value Date/Time   CHOL 87 (L) 02/12/2021 1159   TRIG 114 02/12/2021 1159   HDL 31 (L) 02/12/2021 1159   CHOLHDL 2.8 02/12/2021 1159   CHOLHDL 2.6 04/04/2016 1415   VLDL 29 04/04/2016 1415   LDLCALC 35 02/12/2021 1159   LDLDIRECT 56 06/08/2012 1120   Assessment/Plan:   T2DM is not controlled likely due to patient needing further adjustment to regimen. Medication adherence appears optimal.  Will not make any medication changes today as patient still not feeling 100% and discussed switching from gingerale to diet or zero which will likely improve hyperglycemic episodes. Will adjust regimen at next visit when patient is back to normal diet. Patient tolerating metformin XR 750 well and will discuss titrating to BID dosing at next visit. Will also discuss initiating SGLT2. Following instruction patient verbalized understanding of treatment plan.    Continued basal insulin Lantus 40 units once daily at night Continued  rapid insulin Humalog to 12 units BID before meals  Continued metformin XR 783m once daily Extensively discussed pathophysiology of diabetes, dietary effects on blood sugar control, and recommended lifestyle interventions. Counseled on s/sx of and management of hypoglycemia Next A1C anticipated January 2023.   Follow-up appointment three weeks to review sugar readings. Written patient instructions provided.  This appointment required 30 minutes of direct patient care.  Thank you for involving pharmacy to assist in providing this patient's care.

## 2021-07-07 NOTE — Assessment & Plan Note (Signed)
T2DM is not controlled likely due to patient needing further adjustment to regimen. Medication adherence appears optimal. Will not make any medication changes today as patient still not feeling 100% and discussed switching from gingerale to diet or zero which will likely improve hyperglycemic episodes. Will adjust regimen at next visit when patient is back to normal diet. Patient tolerating metformin XR 750 well and will discuss titrating to BID dosing at next visit. Will also discuss initiating SGLT2. Following instruction patient verbalized understanding of treatment plan.    1. Continued basal insulin Lantus 40 units once daily at night 2. Continued  rapid insulin Humalog to 12 units BID before meals  3. Continued metformin XR 750mg  once daily 4. Extensively discussed pathophysiology of diabetes, dietary effects on blood sugar control, and recommended lifestyle interventions. 5. Counseled on s/sx of and management of hypoglycemia 6. Next A1C anticipated January 2023.   Follow-up appointment three weeks to review sugar readings.

## 2021-07-07 NOTE — Patient Instructions (Addendum)
Miss Dargan it was a pleasure seeing you today.   The sensor is small waterproof disc that is placed on the back of the upper arm.  There is a very thin filament that is inserted under the surface of the skin and measures the amount of glucose in the interstitial fluid.  This system collects your sugar levels for up to 14 days and it automatically records the glucose level every 15 minutes. This will show your provider any patterns in your glucose levels.  Please remember... 1. Sensor will last 14 days 2. Sensor should be applied to area away from scarring, tattoos, irritation, and bones. 3. Starting the sensor: 1 hour warm up before BG readings available   4. Scan the sensor at least every 8 hours 5. Hold reader within 1.5 inches of sensor to scan 6. When the blood drop and magnifying glass symbol appears, test fingerstick blood glucose prior to making treatment decisions 7. Do a fingerstick blood glucose test if the sensor readings do not match how you feel 8. Remove sensor prior to magnetic resonance imaging (MRI), computed tomography (CT) scan, or high-frequency electrical heat (diathermy) treatment. 9. Freestyle Libre may be worn through a Environmental education officer. It may not be exposed to an advanced Imaging Technology (AIT) body scanner (also called a millimeter wave scanner) or the baggage x-ray machine. Instead, ask for hand-wanding or full-body pat-down and visual inspection.  10. Doses of vitamin C (ascorbic acid) >500 mg every day may cause false high readings. 11. Do not submerge more than 3 feet or keep underwater longer than 30 minutes at a time. Gently pat to dry.  12. Store sensor kit between 39 and 77 degrees Farenheit. Can be refrigerated within this temperature range.  Problems with Freestyle Libre sticking? 1. Order Tegaderm I.V. films to place directly over [facility] sensor on arm. 2. May also order Skin Tac from [facility]. Alcohol swab area you plan to administer  Freestyle Libre then let dry. Once dry, apply Skin Tac in a circular motion (with a spot in the middle for sensor without skin tac) and let dry. Once dry you can apply Freestyle Libre   Problems taking off Freestyle [place]? 1. Remember to try to shower before removing Freestyle Libre 2. Order Tac Away to help remove any extra adhesive left on your skin once you remove Freestyle Libre 3. May also try baby oil to loosen adhesive  [place] Phone number: [phone] Available 7 days a week; excluding holidays 8 AM to 8PM EST  [email or web address]  Please do the following:  Continue taking all your medications as directed today during your appointment. If you have any questions or if you believe something has occurred because of this change, call me or your doctor to let one of [place] know.  Continue checking blood sugars at home. It's really important that you record these and bring these in to your next doctor's appointment.  Continue making the lifestyle changes we've discussed together during our visit. Diet and exercise play a significant role in improving your blood sugars.  Follow-up with me on December 21st at 9:00 AM Tegaderm can be ordered off [place] or at [place] or [employer]  Hypoglycemia or low blood sugar:   Low blood sugar can happen quickly and may become an emergency if not treated right away.   While this shouldn't happen often, it can be brought upon if you skip a meal or do not eat enough. Also, if your insulin or  other diabetes medications are dosed too high, this can cause your blood sugar to go to low.   Warning signs of low blood sugar include: Feeling shaky or dizzy Feeling weak or tired  Excessive hunger Feeling anxious or upset  Sweating even when you aren't exercising  What to do if I experience low blood sugar? Follow the Rule of 15 Check your blood sugar. If lower than 70, proceed to step 2.  Treat with 15 grams of fast acting carbs which is  found in 3-4 glucose tablets. If none are available you can try hard candy, 1 tablespoon of sugar or honey,4 ounces of fruit juice, or 6 ounces of REGULAR soda.  Re-check your sugar in 15 minutes. If it is still below 70, do what you did in step 2 again. If your blood sugar has come back up, go ahead and eat a snack or small meal made up of complex carbs (ex. Whole grains) and protein at this time to avoid recurrence of low blood sugar.

## 2021-07-08 ENCOUNTER — Telehealth: Payer: Self-pay

## 2021-07-08 MED ORDER — BD PEN NEEDLE MINI U/F 31G X 5 MM MISC: 2 refills | Status: DC

## 2021-07-08 NOTE — Telephone Encounter (Signed)
Patient calls nurse line reporting a "nagging" cough for the last week. Patient denies fever, congestion, SOB or headaches. Patient would like to know which OTC cough medications she can take that will not effect her sugars.   Please advise.

## 2021-07-09 NOTE — Telephone Encounter (Signed)
Called patient to discuss OTC medications for cough and that she can try the diabetic Tussin if she would like and to avoid things that have dextromethorphan. Counseled patient to ask the pharmacist at the store if she is confused between products while she is there.  Patient also notes that she has been getting calls that one of her medications is needing to be addressed, though she cannot remember the name of it. She says the company has been trying to get in touch, but I am unsure what medication is being discussed. I will check physical inboxes today to see if there are any requests, the only ones I have received most recently have been related to her CGM, which has already been done through the pharmacy team and is already in place.    Mercedes Rabenold, DO

## 2021-07-13 ENCOUNTER — Other Ambulatory Visit: Payer: Self-pay

## 2021-07-13 MED ORDER — METFORMIN HCL ER 750 MG PO TB24: 750.0000 mg | ORAL_TABLET | Freq: Every day | ORAL | 1 refills | Status: DC

## 2021-07-26 DIAGNOSIS — E119 Type 2 diabetes mellitus without complications: Secondary | ICD-10-CM | POA: Diagnosis not present

## 2021-07-28 ENCOUNTER — Other Ambulatory Visit: Payer: Self-pay

## 2021-07-28 ENCOUNTER — Ambulatory Visit (INDEPENDENT_AMBULATORY_CARE_PROVIDER_SITE_OTHER): Payer: Medicare Other | Admitting: Pharmacist

## 2021-07-28 DIAGNOSIS — E119 Type 2 diabetes mellitus without complications: Secondary | ICD-10-CM

## 2021-07-28 DIAGNOSIS — Z794 Long term (current) use of insulin: Secondary | ICD-10-CM | POA: Diagnosis not present

## 2021-07-28 NOTE — Patient Instructions (Signed)
Mercedes Barr it was a pleasure seeing you today.   Please do the following:  Decrease Humalog to 10 units twice a day before meals and Lantus to 38 units once daily as directed today during your appointment. If you have any questions or if you believe something has occurred because of this change, call me or your doctor to let one of [place] know.  Continue checking blood sugars at home. It's really important that you record these and bring these in to your next doctor's appointment.  Continue making the lifestyle changes we've discussed together during our visit. Diet and exercise play a significant role in improving your blood sugars.  Follow-up with me in three weeks.    Hypoglycemia or low blood sugar:   Low blood sugar can happen quickly and may become an emergency if not treated right away.   While this shouldn't happen often, it can be brought upon if you skip a meal or do not eat enough. Also, if your insulin or other diabetes medications are dosed too high, this can cause your blood sugar to go to low.   Warning signs of low blood sugar include: Feeling shaky or dizzy Feeling weak or tired  Excessive hunger Feeling anxious or upset  Sweating even when you aren't exercising  What to do if I experience low blood sugar? Follow the Rule of 15 Check your blood sugar with your meter. If lower than 70, proceed to step 2.  Treat with 15 grams of fast acting carbs which is found in 3-4 glucose tablets. If none are available you can try hard candy, 1 tablespoon of sugar or honey,4 ounces of fruit juice, or 6 ounces of REGULAR soda.  Re-check your sugar in 15 minutes. If it is still below 70, do what you did in step 2 again. If your blood sugar has come back up, go ahead and eat a snack or small meal made up of complex carbs (ex. Whole grains) and protein at this time to avoid recurrence of low blood sugar.

## 2021-07-28 NOTE — Progress Notes (Signed)
Subjective:    Patient ID: Mercedes Barr, female    DOB: [birth year], 75 y.o.   MRN: [id number]  HPI Patient is a 75 y.o. female who presents for diabetes management and Libre 2.0 follow-up. She is in good spirits and presents without assistance. Patient was referred and last seen by Primary Care Provider on.05/26/21. Last seen in pharmacy clinic on 07/07/21.  Patient reports she has been feeling better and is back to her normal diet and noticed an improvement in her blood glucose. She did forget her [place] reader to download but states she has seen an improvement.  Patient reports diabetes was diagnosed in 2002.   Insurance coverage/medication affordability: UHC Medicare/Medicaid  Family/Social history: lives with her daughter and her husband  Current diabetes medications include: metformin XR 764m, Lantus 40 units at night, Humalog 12 units BID Patient states that She is taking her medications as prescribed. Patient reports adherence with medications.  Do you feel that your medications are working for you?  yes  Have you been experiencing any side effects to the medications prescribed? no  Do you have any problems obtaining medications due to transportation or finances?  no    Patient reports hypoglycemic event where she awoke last Thursday around 3AM and her blood glucose was 42. She thinks this is due to not eating as late in the day as she previously has in the past. Patient denies polyuria (increased urination).  Patient denies polyphagia (increased appetite).  Patient reports polydipsia (increased thirst).  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 Patient reports self foot exams.   Freestyle Libre 2.0 patient education Person(s)instructed: patient  Patient taking >500 mg Vitamin C: denies Reminded patient to not take Vitamin C with Freestyle Libre.  Sensor application -- sensor placed on left arm 1. Site selection and site prep with  alcohol pad/Skin Tac 2. Sensor prep-sensor pack and sensor applicator 3. Starting the sensor: 1 hour warm up before BG readings available     Will ask for fingersticks the first 12 hours   4. Sensor change every 14 days and rotate site 5. Call [employer] customer service if sensor comes off before 14 days  Safety and Troubleshooting 1. Scan the sensor at least every 8 hours 2. When the "test BG" symbol appears, test fingerstick blood glucose prior to    making treatment decisions 3. Do a fingerstick blood glucose test if the sensor readings do not match how    you feel 4. Remove sensor prior for MRI or CT. Sensor may be damaged by exposure to    airport x-ray screening 5. Vitamin C may cause false high readings and aspirin may cause false low     readings 6. Store sensor kit between 39 and 77 degrees. Can be refrigerated at this temp.  Contact information provided for APraxaircustomer service and/or trainer.  Objective:   CGM data last 14 days: did not bring reader   Labs:   Physical Exam Neurological:     Mental Status: She is alert and oriented to person, place, and time.    Review of Systems  Gastrointestinal:  Positive for abdominal pain and diarrhea. Negative for nausea and vomiting.   Lab Results  Component Value Date   HGBA1C 9.7 (A) 05/26/2021   HGBA1C 8.7 (A) 02/12/2021   HGBA1C 8.4 (A) 05/22/2020   Lipid Panel     Component Value Date/Time   CHOL 87 (L) 02/12/2021 1159   TRIG 114 02/12/2021  1159   HDL 31 (L) 02/12/2021 1159   CHOLHDL 2.8 02/12/2021 1159   CHOLHDL 2.6 04/04/2016 1415   VLDL 29 04/04/2016 1415   LDLCALC 35 02/12/2021 1159   LDLDIRECT 56 06/08/2012 1120   Assessment/Plan:   T2DM is not controlled likely due to patient needing further adjustment to regimen. Difficult to assess glucose readings today due to patient forgetting [facility] reader. Based on patient reporting a low blood glucose readings will decrease both basal and bolus insulin for the  time being until next visit where we can assess glucose readings based on CGM data. Will decrease each administration of insulin by 2 units. Patient tolerating metformin XR 750 well and will discuss titrating to twice daily dosing at next visit. Will also discuss initiating SGLT2 at future visit. Following instruction patient verbalized understanding of treatment plan.    Decreased dose of basal insulin Lantus to 38 units once daily at night Decreased dose of  rapid insulin Humalog to 10 units BID before meals  Continued metformin XR 760m once daily Extensively discussed pathophysiology of diabetes, dietary effects on blood sugar control, and recommended lifestyle interventions. Counseled on s/sx of and management of hypoglycemia Next A1C anticipated January 2023.   Patient requesting refill of methocarbamol due to ongoing knee pain. Will make PCP aware.   Follow-up appointment three weeks to review sugar readings. Written patient instructions provided.  This appointment required 30 minutes of direct patient care.  Thank you for involving pharmacy to assist in providing this patient's care.

## 2021-07-28 NOTE — Assessment & Plan Note (Signed)
°  T2DM is not controlled likely due to patient needing further adjustment to regimen. Difficult to assess glucose readings today due to patient forgetting [facility] reader. Based on patient reporting a low blood glucose readings will decrease both basal and bolus insulin for the time being until next visit where we can assess glucose readings based on CGM data. Will decrease each administration of insulin by 2 units. Patient tolerating metformin XR 750 well and will discuss titrating to twice daily dosing at next visit. Will also discuss initiating SGLT2 at future visit. Following instruction patient verbalized understanding of treatment plan.    1. Decreased dose of basal insulin Lantus to 38 units once daily at night 2. Decreased dose of  rapid insulin Humalog to 10 units BID before meals  3. Continued metformin XR 750mg  once daily 4. Extensively discussed pathophysiology of diabetes, dietary effects on blood sugar control, and recommended lifestyle interventions. 5. Counseled on s/sx of and management of hypoglycemia 6. Next A1C anticipated January 2023.

## 2021-08-18 ENCOUNTER — Other Ambulatory Visit: Payer: Self-pay

## 2021-08-18 ENCOUNTER — Ambulatory Visit (INDEPENDENT_AMBULATORY_CARE_PROVIDER_SITE_OTHER): Payer: Commercial Managed Care - HMO | Admitting: Pharmacist

## 2021-08-18 ENCOUNTER — Ambulatory Visit (INDEPENDENT_AMBULATORY_CARE_PROVIDER_SITE_OTHER): Payer: Commercial Managed Care - HMO

## 2021-08-18 VITALS — Wt 145.0 lb

## 2021-08-18 DIAGNOSIS — Z794 Long term (current) use of insulin: Secondary | ICD-10-CM

## 2021-08-18 DIAGNOSIS — E119 Type 2 diabetes mellitus without complications: Secondary | ICD-10-CM | POA: Diagnosis not present

## 2021-08-18 DIAGNOSIS — I442 Atrioventricular block, complete: Secondary | ICD-10-CM

## 2021-08-18 MED ORDER — METFORMIN HCL ER 750 MG PO TB24: 750.0000 mg | ORAL_TABLET | Freq: Two times a day (BID) | ORAL | 0 refills | Status: DC

## 2021-08-18 MED ORDER — BD PEN NEEDLE NANO U/F 32G X 4 MM MISC: 12 refills | Status: DC

## 2021-08-18 NOTE — Patient Instructions (Signed)
Mercedes Barr it was a pleasure seeing you today.   Please do the following:  Continue Lantus 38 units once daily and take Humalog 8 units three times a day 30 minutes before your meals (breakfast, lunch, dinner) as directed today during your appointment. If you have any questions or if you believe something has occurred because of this change, call me or your doctor to let one of [place] know.  If your kidney function is still good I am sending in a new medication called Jardiance 10mg  and you will take this once a day before breakfast Continue checking blood sugars at home. It's really important that you record these and bring these in to your next doctor's appointment.  Continue making the lifestyle changes we've discussed together during our visit. Diet and exercise play a significant role in improving your blood sugars.  Follow-up with me in three weeks.    Hypoglycemia or low blood sugar:   Low blood sugar can happen quickly and may become an emergency if not treated right away.   While this shouldn't happen often, it can be brought upon if you skip a meal or do not eat enough. Also, if your insulin or other diabetes medications are dosed too high, this can cause your blood sugar to go to low.   Warning signs of low blood sugar include: Feeling shaky or dizzy Feeling weak or tired  Excessive hunger Feeling anxious or upset  Sweating even when you aren't exercising  What to do if I experience low blood sugar? Follow the Rule of 15 Check your blood sugar with your meter. If lower than 70, proceed to step 2.  Treat with 15 grams of fast acting carbs which is found in 3-4 glucose tablets. If none are available you can try hard candy, 1 tablespoon of sugar or honey,4 ounces of fruit juice, or 6 ounces of REGULAR soda.  Re-check your sugar in 15 minutes. If it is still below 70, do what you did in step 2 again. If your blood sugar has come back up, go ahead and eat a snack or small meal made up  of complex carbs (ex. Whole grains) and protein at this time to avoid recurrence of low blood sugar.

## 2021-08-18 NOTE — Assessment & Plan Note (Addendum)
T2DM is not controlled likely due to patient needing further adjustment to regimen. Patient self-increased metformin XR 750 from once daily to twice daily and since she is tolerating well will continue. Will also obtain BMP today to check eGFR and based on that will determine ability to initiate SGLT2 therapy. Patient demonstrating afternoon/evening hypoglycemia therefore will add additional bolus of Humalog to lunchtime meal and adjust dose from 10 units twice daily to 8 units three times daily. In future can consider GLP1 for glycemic control titrating carefully as to not cause excessive weight loss in patient as losing weight is a concern for her. Following instruction patient verbalized understanding of treatment plan.    1. Continued basal insulin Lantus to 38 units once daily at night 2. Adjusted dose of  rapid insulin Humalog to 8 units three times daily before meals  3. Continued metformin XR 772m twice daily 4. Will start Jardiance 142monce daily pending BMP. Patient counseled on sick day rules. 5. Extensively discussed pathophysiology of diabetes, dietary effects on blood sugar control, and recommended lifestyle interventions. 6. Counseled on s/sx of and management of hypoglycemia 7. Next A1C anticipated at next appointment  Follow-up appointment three weeks to review sugar readings.

## 2021-08-18 NOTE — Progress Notes (Signed)
Subjective:    Patient ID: Mercedes Barr, female    DOB: 02-21-1946, 76 y.o.   MRN: [id number]  HPI Patient is a 76 y.o. female who presents for diabetes management and Libre 2.0 follow-up. She is in good spirits and presents without assistance. Patient was referred and last seen by Primary Care Provider on.05/26/21. Last seen in pharmacy clinic on 07/28/21.  Patient reports she has been feeling better and is back to her normal diet and noticed an improvement in her blood glucose. She did forget her [place] reader to download but states she has seen an improvement.  Patient reports diabetes was diagnosed in 2002.   Insurance coverage/medication affordability: UHC Medicare/Medicaid  Family/Social history: lives with her daughter and her husband  Current diabetes medications include: metformin XR 748m twice daily, Lantus 38 units at night, Humalog 10 units twice daily Patient states that She is taking her medications as prescribed. Patient reports adherence with medications.  Do you feel that your medications are working for you?  yes  Have you been experiencing any side effects to the medications prescribed? no  Do you have any problems obtaining medications due to transportation or finances?  no    Patient denies hypoglycemic events. Patient denies polyuria (increased urination).  Patient denies polyphagia (increased appetite).  Patient denies polydipsia (increased thirst).  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 Patient reports self foot exams.   Freestyle Libre 2.0 patient education Person(s)instructed: patient  Patient taking >500 mg Vitamin C: denies Reminded patient to not take Vitamin C with Freestyle Libre.  Safety and Troubleshooting 1. Scan the sensor at least every 8 hours 2. When the "test BG" symbol appears, test fingerstick blood glucose prior to    making treatment decisions 3. Do a fingerstick blood glucose test if the  sensor readings do not match how    you feel 4. Remove sensor prior for MRI or CT. Sensor may be damaged by exposure to    airport x-ray screening 5. Vitamin C may cause false high readings and aspirin may cause false low     readings 6. Store sensor kit between 39 and 77 degrees. Can be refrigerated at this temp.  Contact information provided for APraxaircustomer service and/or trainer.  Objective:   CGM data last 14 days:     Labs:   Physical Exam Neurological:     Mental Status: She is alert and oriented to person, place, and time.    Review of Systems  Gastrointestinal:  Negative for abdominal pain, diarrhea, nausea and vomiting.   Lab Results  Component Value Date   HGBA1C 9.7 (A) 05/26/2021   HGBA1C 8.7 (A) 02/12/2021   HGBA1C 8.4 (A) 05/22/2020   Lipid Panel     Component Value Date/Time   CHOL 87 (L) 02/12/2021 1159   TRIG 114 02/12/2021 1159   HDL 31 (L) 02/12/2021 1159   CHOLHDL 2.8 02/12/2021 1159   CHOLHDL 2.6 04/04/2016 1415   VLDL 29 04/04/2016 1415   LDLCALC 35 02/12/2021 1159   LDLDIRECT 56 06/08/2012 1120   Assessment/Plan:   T2DM is not controlled likely due to patient needing further adjustment to regimen. Patient self-increased metformin XR 750 from once daily to twice daily and since she is tolerating well will continue. Will also obtain BMP today to check eGFR and based on that will determine ability to initiate SGLT2 therapy. Patient demonstrating afternoon/evening hypoglycemia therefore will add additional bolus of Humalog to lunchtime meal  and adjust dose from 10 units twice daily to 8 units three times daily. In future can consider GLP1 for glycemic control titrating carefully as to not cause excessive weight loss in patient as losing weight is a concern for her. Following instruction patient verbalized understanding of treatment plan.    Continued basal insulin Lantus to 38 units once daily at night Adjusted dose of  rapid insulin Humalog to  8 units three times daily before meals  Continued metformin XR 787m twice daily Will start Jardiance 171monce daily pending BMP. Patient counseled on sick day rules. Extensively discussed pathophysiology of diabetes, dietary effects on blood sugar control, and recommended lifestyle interventions. Counseled on s/sx of and management of hypoglycemia Next A1C anticipated at next appointment  Follow-up appointment three weeks to review sugar readings. Written patient instructions provided.  This appointment required 30 minutes of direct patient care.  Thank you for involving pharmacy to assist in providing this patient's care.

## 2021-08-19 LAB — BASIC METABOLIC PANEL
BUN/Creatinine Ratio: 17 (ref 12–28)
BUN: 12 mg/dL (ref 8–27)
CO2: 19 mmol/L — ABNORMAL LOW (ref 20–29)
Calcium: 9.2 mg/dL (ref 8.7–10.3)
Chloride: 106 mmol/L (ref 96–106)
Creatinine, Ser: 0.72 mg/dL (ref 0.57–1.00)
Glucose: 102 mg/dL — ABNORMAL HIGH (ref 70–99)
Potassium: 3.9 mmol/L (ref 3.5–5.2)
Sodium: 144 mmol/L (ref 134–144)
eGFR: 88 mL/min/{1.73_m2} (ref >59)

## 2021-08-19 MED ORDER — EMPAGLIFLOZIN 10 MG PO TABS
10.0000 mg | ORAL_TABLET | Freq: Every day | ORAL | 0 refills | Status: DC
Start: 2021-08-19 — End: 2021-09-16

## 2021-08-19 NOTE — Progress Notes (Signed)
Addendum: BMP came back and patient eligible for Jardiance initiation based on GFR. Will start Jardiance 10mg  once daily.

## 2021-08-19 NOTE — Addendum Note (Signed)
Addended by: Hughes Better on: 08/19/2021 10:09 AM   Modules accepted: Orders

## 2021-08-20 ENCOUNTER — Other Ambulatory Visit: Payer: Self-pay

## 2021-08-20 LAB — CUP PACEART REMOTE DEVICE CHECK
Battery Impedance: 306 Ohm
Battery Remaining Longevity: 113 mo
Battery Voltage: 2.78 V
Brady Statistic RV Percent Paced: 100 %
Date Time Interrogation Session: 20230112103833
Implantable Lead Implant Date: 19990203
Implantable Lead Implant Date: 19990203
Implantable Lead Location: 753859
Implantable Lead Location: 753860
Implantable Lead Model: 5068
Implantable Lead Model: 5092
Implantable Pulse Generator Implant Date: 20180711
Lead Channel Impedance Value: 619 Ohm
Lead Channel Impedance Value: 67 Ohm
Lead Channel Pacing Threshold Amplitude: 0.625 V
Lead Channel Pacing Threshold Pulse Width: 0.4 ms
Lead Channel Setting Pacing Amplitude: 2.5 V
Lead Channel Setting Pacing Pulse Width: 0.4 ms
Lead Channel Setting Sensing Sensitivity: 4 mV

## 2021-08-23 MED ORDER — FLUTICASONE PROPIONATE HFA 110 MCG/ACT IN AERO: INHALATION_SPRAY | RESPIRATORY_TRACT | 3 refills | Status: DC

## 2021-08-26 DIAGNOSIS — E119 Type 2 diabetes mellitus without complications: Secondary | ICD-10-CM | POA: Diagnosis not present

## 2021-08-27 ENCOUNTER — Other Ambulatory Visit: Payer: Self-pay | Admitting: Family Medicine

## 2021-08-27 DIAGNOSIS — J42 Unspecified chronic bronchitis: Secondary | ICD-10-CM

## 2021-08-27 NOTE — Progress Notes (Signed)
Remote pacemaker transmission.   

## 2021-09-06 ENCOUNTER — Other Ambulatory Visit: Payer: Self-pay

## 2021-09-06 MED ORDER — INSULIN LISPRO (1 UNIT DIAL) 100 UNIT/ML (KWIKPEN)
12.0000 [IU] | PEN_INJECTOR | Freq: Two times a day (BID) | SUBCUTANEOUS | 11 refills | Status: DC
Start: 2021-09-06 — End: 2022-09-12

## 2021-09-08 ENCOUNTER — Other Ambulatory Visit: Payer: Self-pay

## 2021-09-08 ENCOUNTER — Ambulatory Visit (INDEPENDENT_AMBULATORY_CARE_PROVIDER_SITE_OTHER): Payer: Medicare Other | Admitting: Pharmacist

## 2021-09-08 DIAGNOSIS — E119 Type 2 diabetes mellitus without complications: Secondary | ICD-10-CM | POA: Diagnosis not present

## 2021-09-08 DIAGNOSIS — Z794 Long term (current) use of insulin: Secondary | ICD-10-CM | POA: Diagnosis not present

## 2021-09-08 NOTE — Patient Instructions (Addendum)
Miss Boisselle it was a pleasure seeing you today.   Please do the following:  Take 19 units of Lantus tonight and 19 units tomorrow morning and then start taking 38 units every morning starting Friday as directed today during your appointment. If you have any questions or if you believe something has occurred because of this change, call me or your doctor to let one of [place] know.  Increase your Humalog to 9 units three times a day about 30 minutes before you eat Continue checking blood sugars at home. It's really important that you record these and bring these in to your next doctor's appointment.  Continue making the lifestyle changes we've discussed together during our visit. Diet and exercise play a significant role in improving your blood sugars.  Follow-up with me in two weeks   Hypoglycemia or low blood sugar:   Low blood sugar can happen quickly and may become an emergency if not treated right away.   While this shouldn't happen often, it can be brought upon if you skip a meal or do not eat enough. Also, if your insulin or other diabetes medications are dosed too high, this can cause your blood sugar to go to low.   Warning signs of low blood sugar include: Feeling shaky or dizzy Feeling weak or tired  Excessive hunger Feeling anxious or upset  Sweating even when you aren't exercising  What to do if I experience low blood sugar? Follow the Rule of 15 Check your blood sugar with your meter. If lower than 70, proceed to step 2.  Treat with 15 grams of fast acting carbs which is found in 3-4 glucose tablets. If none are available you can try hard candy, 1 tablespoon of sugar or honey,4 ounces of fruit juice, or 6 ounces of REGULAR soda.  Re-check your sugar in 15 minutes. If it is still below 70, do what you did in step 2 again. If your blood sugar has come back up, go ahead and eat a snack or small meal made up of complex carbs (ex. Whole grains) and protein at this time to avoid  recurrence of low blood sugar.

## 2021-09-08 NOTE — Assessment & Plan Note (Signed)
T2DM is not controlled likely due to patient needing further adjustment to regimen. Patient demonstrating slight improvement in afternoon/evening hyperglycemia after splitting Humalog dosing from twice daily to three times daily, although still experiencing increase in blood glucose at this time. Will increase Humalog to 9 units three times daily. Patient also agreeable to try switching Lantus dosing from evening to morning to see if this will improve blood glucose readings slightly and avoid hypoglycemic event that occurred during nighttime. In future can consider GLP1 for glycemic control titrating carefully as to not cause excessive weight loss in patient as losing weight is a concern for her. Following instruction patient verbalized understanding of treatment plan.    1. Continued basal insulin Lantus to 38 units once daily at night 2. Increased dose of  rapid insulin Humalog to 9 units three times daily before meals  3. Continued metformin XR 750mg  twice daily 4. Continued Jardiance 10mg  once daily  5. Extensively discussed pathophysiology of diabetes, dietary effects on blood sugar control, and recommended lifestyle interventions. 6. Counseled on s/sx of and management of hypoglycemia 7. Unable to obtain A1C today; anticipated at next appointment

## 2021-09-08 NOTE — Progress Notes (Signed)
Edit to say A/P should say patient experiencing afternoon/evening HYPERglycemia NOT HYPOglycemia.

## 2021-09-08 NOTE — Progress Notes (Signed)
Subjective:    Patient ID: Mercedes Barr, female    DOB: 12/12/1945, 76 y.o.   MRN: [id number]  HPI Patient is a 76 y.o. female who presents for diabetes management and Libre 2.0 follow-up. She is in good spirits and presents without assistance. Patient was referred and last seen by Primary Care Provider on 05/26/21. Last seen in pharmacy clinic on 08/18/21.  Patient reports diabetes was diagnosed in 2002.   Insurance coverage/medication affordability: [employer] Medicare/Medicaid  Family/Social history: lives with her daughter and her husband  Current diabetes medications include: metformin XR 719m twice daily, Lantus 38 units at night, Humalog 8 units three times daily Patient states that She is taking her medications as prescribed. Patient reports adherence with medications.  Do you feel that your medications are working for you?  yes  Have you been experiencing any side effects to the medications prescribed? no  Do you have any problems obtaining medications due to transportation or finances?  no    Patient reports one hypoglycemic event during nighttime which she attributes to not eating enough. Patient reports polyuria (increased urination).  Patient denies polyphagia (increased appetite).  Patient denies polydipsia (increased thirst).  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 Patient reports self foot exams.   Freestyle Libre 2.0 patient education Person(s)instructed: patient  Patient taking >500 mg Vitamin C: denies Reminded patient to not take Vitamin C with Freestyle Libre.  Safety and Troubleshooting 1. Scan the sensor at least every 8 hours 2. When the "test BG" symbol appears, test fingerstick blood glucose prior to    making treatment decisions 3. Do a fingerstick blood glucose test if the sensor readings do not match how    you feel 4. Remove sensor prior for MRI or CT. Sensor may be damaged by exposure to    airport x-ray  screening 5. Vitamin C may cause false high readings and aspirin may cause false low     readings 6. Store sensor kit between 39 and 77 degrees. Can be refrigerated at this temp.  Contact information provided for [employer] service and/or trainer.  Objective:   CGM data last 14 days:     Labs:   Physical Exam Neurological:     Mental Status: She is alert and oriented to person, place, and time.    Review of Systems  Gastrointestinal:  Negative for abdominal pain, diarrhea, nausea and vomiting.   Lab Results  Component Value Date   HGBA1C 9.7 (A) 05/26/2021   HGBA1C 8.7 (A) 02/12/2021   HGBA1C 8.4 (A) 05/22/2020   Lipid Panel     Component Value Date/Time   CHOL 87 (L) 02/12/2021 1159   TRIG 114 02/12/2021 1159   HDL 31 (L) 02/12/2021 1159   CHOLHDL 2.8 02/12/2021 1159   CHOLHDL 2.6 04/04/2016 1415   VLDL 29 04/04/2016 1415   LDLCALC 35 02/12/2021 1159   LDLDIRECT 56 06/08/2012 1120   Assessment/Plan:   T2DM is not controlled likely due to patient needing further adjustment to regimen. Patient demonstrating slight improvement in afternoon/evening hyperglycemia after splitting Humalog dosing from twice daily to three times daily, although still experiencing increase in blood glucose at this time. Will increase Humalog to 9 units three times daily. Patient also agreeable to try switching Lantus dosing from evening to morning to see if this will improve blood glucose readings slightly and avoid hypoglycemic event that occurred during nighttime. In future can consider GLP1 for glycemic control titrating carefully as  to not cause excessive weight loss in patient as losing weight is a concern for her. Following instruction patient verbalized understanding of treatment plan.    Continued basal insulin Lantus to 38 units once daily at night Increased dose of  rapid insulin Humalog to 9 units three times daily before meals  Continued metformin XR 768m twice daily Continued  Jardiance 19monce daily  Extensively discussed pathophysiology of diabetes, dietary effects on blood sugar control, and recommended lifestyle interventions. Counseled on s/sx of and management of hypoglycemia Unable to obtain A1C today; anticipated at next appointment  Follow-up appointment two weeks to review sugar readings. Written patient instructions provided.  This appointment required 30 minutes of direct patient care.  Thank you for involving pharmacy to assist in providing this patient's care.

## 2021-09-10 ENCOUNTER — Telehealth: Payer: Self-pay

## 2021-09-10 NOTE — Telephone Encounter (Signed)
Patient calls nurse line reporting cough and chest congestion for ~ 1 week. Patient reports productive cough with "yellowish" sputum. Patient denies fever/chills, headache or SOB. Patient reports she has been using her inhaler with "some" relief.   Patient is requesting something for cough. Patient reports she has diabetes and hypertension and doesn't know what to purchase OTC.  Patient has an apt scheduled with PCP on 2/15. We have no apts today as of now.  Conservative measures given to patient and red flags discussed.   Will forward to PCP.

## 2021-09-16 ENCOUNTER — Other Ambulatory Visit: Payer: Self-pay | Admitting: Family Medicine

## 2021-09-21 DIAGNOSIS — E1136 Type 2 diabetes mellitus with diabetic cataract: Secondary | ICD-10-CM | POA: Diagnosis not present

## 2021-09-21 DIAGNOSIS — H401131 Primary open-angle glaucoma, bilateral, mild stage: Secondary | ICD-10-CM | POA: Diagnosis not present

## 2021-09-21 DIAGNOSIS — H25813 Combined forms of age-related cataract, bilateral: Secondary | ICD-10-CM | POA: Diagnosis not present

## 2021-09-21 DIAGNOSIS — Z794 Long term (current) use of insulin: Secondary | ICD-10-CM | POA: Diagnosis not present

## 2021-09-22 ENCOUNTER — Ambulatory Visit: Payer: Medicare Other | Admitting: Family Medicine

## 2021-09-22 ENCOUNTER — Ambulatory Visit: Payer: Medicare Other | Admitting: Pharmacist

## 2021-09-26 DIAGNOSIS — E119 Type 2 diabetes mellitus without complications: Secondary | ICD-10-CM | POA: Diagnosis not present

## 2021-10-06 LAB — HM DIABETES EYE EXAM

## 2021-10-11 ENCOUNTER — Other Ambulatory Visit: Payer: Self-pay

## 2021-10-11 DIAGNOSIS — I4819 Other persistent atrial fibrillation: Secondary | ICD-10-CM

## 2021-10-11 MED ORDER — RIVAROXABAN 20 MG PO TABS: 20.0000 mg | ORAL_TABLET | Freq: Every day | ORAL | 3 refills | Status: DC

## 2021-10-11 MED ORDER — CITALOPRAM HYDROBROMIDE 20 MG PO TABS: 20.0000 mg | ORAL_TABLET | Freq: Every morning | ORAL | 3 refills | Status: DC

## 2021-10-18 ENCOUNTER — Other Ambulatory Visit: Payer: Self-pay | Admitting: Family Medicine

## 2021-10-18 DIAGNOSIS — E119 Type 2 diabetes mellitus without complications: Secondary | ICD-10-CM

## 2021-10-24 DIAGNOSIS — E119 Type 2 diabetes mellitus without complications: Secondary | ICD-10-CM | POA: Diagnosis not present

## 2021-10-26 ENCOUNTER — Other Ambulatory Visit: Payer: Self-pay | Admitting: *Deleted

## 2021-10-26 NOTE — Telephone Encounter (Signed)
Needs this one sent to [facility] because [facility] is running late on getting this med to her house. Christen Bame, CMA ? ?

## 2021-10-27 MED ORDER — EMPAGLIFLOZIN 10 MG PO TABS: 10.0000 mg | ORAL_TABLET | Freq: Every day | ORAL | 3 refills | Status: DC

## 2021-11-09 ENCOUNTER — Other Ambulatory Visit: Payer: Self-pay | Admitting: Family Medicine

## 2021-11-09 DIAGNOSIS — R1013 Epigastric pain: Secondary | ICD-10-CM

## 2021-11-10 ENCOUNTER — Encounter (HOSPITAL_COMMUNITY): Payer: Self-pay | Admitting: Emergency Medicine

## 2021-11-10 ENCOUNTER — Ambulatory Visit (HOSPITAL_COMMUNITY)
Admission: EM | Admit: 2021-11-10 | Discharge: 2021-11-10 | Disposition: A | Discharge status: Home / Self Care | Payer: Medicare Other | Attending: Nurse Practitioner | Admitting: Nurse Practitioner

## 2021-11-10 DIAGNOSIS — J029 Acute pharyngitis, unspecified: Secondary | ICD-10-CM

## 2021-11-10 DIAGNOSIS — Z20822 Contact with and (suspected) exposure to covid-19: Secondary | ICD-10-CM | POA: Insufficient documentation

## 2021-11-10 DIAGNOSIS — J069 Acute upper respiratory infection, unspecified: Secondary | ICD-10-CM | POA: Diagnosis not present

## 2021-11-10 DIAGNOSIS — J309 Allergic rhinitis, unspecified: Secondary | ICD-10-CM | POA: Diagnosis not present

## 2021-11-10 LAB — POCT RAPID STREP A, ED / UC: Streptococcus, Group A Screen (Direct): NEGATIVE

## 2021-11-10 MED ORDER — CETIRIZINE HCL 10 MG PO TABS
10.0000 mg | ORAL_TABLET | Freq: Every day | ORAL | 0 refills | Status: DC
Start: 2021-11-10 — End: 2022-03-23

## 2021-11-10 MED ORDER — PROMETHAZINE-DM 6.25-15 MG/5ML PO SYRP: 5.0000 mL | ORAL_SOLUTION | Freq: Four times a day (QID) | ORAL | 0 refills | Status: AC | PRN

## 2021-11-10 MED ORDER — FLUTICASONE PROPIONATE 50 MCG/ACT NA SUSP: 2.0000 | Freq: Every day | NASAL | 0 refills | Status: DC

## 2021-11-10 NOTE — ED Triage Notes (Signed)
Pt reports cough with yellow phlegm, sore throat and congestion since this past weekend.  ?

## 2021-11-10 NOTE — ED Provider Notes (Signed)
?[facility] ? ? ? ?CSN: [id number] ?Arrival date & time: 11/10/21  1642 ? ? ?  ? ?History   ?Chief Complaint ?Chief Complaint  ?Patient presents with  ? Cough  ? Sore Throat  ? ? ?HPI ?Mercedes Barr is a 76 y.o. female.  ? ?The patient is a 76 year old female who presents with upper respiratory symptoms for the past 4 days.  Symptoms include a productive cough, sore throat, nasal congestion, bilateral ear fullness, and runny nose.  Patient also admits to wheezing.  She states her cough is worse at night, and she is having difficulty sleeping.  She does have a history of asthma.  Patient denies fever, chills, or GI symptoms.  States she has been using her inhaler at least twice daily.  She has also been using warm salt water gargles and taking Mucinex for her symptoms.  She denies any sick contacts.  She has received 4 COVID vaccines. ? ?The history is provided by the patient.  ?Sore Throat ?Associated symptoms include shortness of breath.  ? ?Past Medical History:  ?Diagnosis Date  ? Arthritis 4/14  ? Tr TKR  ? Asthma   ? Complete heart block ([place])   ? COPD (chronic obstructive pulmonary disease) ([place])   ? Coronary artery disease   ? Diabetes mellitus   ? GERD (gastroesophageal reflux disease)   ? Heart murmur   ? Hypertension   ? Hypothyroidism   ? Normal coronary arteries 2011  ? Pacemaker   ? PACEMAKER DEPENDENT-DR. [place].  OFFICE NOTE DR. A. LITTLE STATES  "EXTREMELY PACEMAKER SENSITIVE AND WHEN YOU TRY TO CHECK FOR UNDERLYING RHYTHMS SHE WILL HAVE SNYCOPE AND WE HAVE NOT DONE THIS NOW IN ABOUT 2 YEARS".  ? Shortness of breath dyspnea   ? walking distance or climbing stairs  ? ? ?Patient Active Problem List  ? Diagnosis Date Noted  ? Osteoporosis 04/03/2019  ? Epigastric pain 01/31/2019  ? Hematuria 12/27/2018  ? Gross hematuria 12/13/2018  ? Rotator cuff tendinitis, right 09/20/2017  ? Persistent atrial fibrillation ([place]) 08/17/2017  ? Tremor of left hand  06/12/2015  ? (HFpEF) heart failure with preserved ejection fraction ([place]) 05/21/2015  ? Difficulty walking 04/27/2015  ? Right hip pain 04/08/2015  ? Left shoulder pain 12/01/2014  ? Osteoarthritis, multiple sites 11/20/2014  ? 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 Complete heart block ([place])   ? Normal coronary arteries-Feb 2011   ? Pacemaker   ? Weight loss, unintentional 12/21/2012  ? Osteoarthritis of right knee 08/22/2012  ? Insomnia 03/08/2012  ? Allergy to bee sting 10/04/2011  ? Low back pain 09/07/2011  ? Hypothyroidism 12/28/2009  ? GLAUCOMA 12/25/2009  ? GERD 01/19/2009  ? COPD (chronic obstructive pulmonary disease) ([place]) 11/06/2008  ? Abnormal involuntary movement 09/03/2008  ? Essential hypertension 06/14/2007  ? DM (diabetes mellitus), type 2 ([place]) 11/06/2006  ? DEPRESSION 11/06/2006  ? Hyperlipidemia 11/03/2006  ? ? ?Past Surgical History:  ?Procedure Laterality Date  ? ATRIOVENTRICULAR CUSHION DEFECT REPAIR  1988  ? CARDIAC CATHETERIZATION  2011   ? SHOWED NO CAD-PER CARDIOLOGY OFFICE NOTES DR. A. LITTLE  ? colonscopy     ? removed polyps   ? INSERT / REPLACE / REMOVE PACEMAKER    ? PACEMAKER INSERTION  1988  ? last gen 11/08- MDT  ? PPM GENERATOR CHANGEOUT N/A 02/15/2017  ? Procedure: PPM Generator Changeout;  Surgeon: Sanda Klein, MD;  Location: [facility];  Service: Cardiovascular;  Laterality: N/A;  ? TOTAL KNEE ARTHROPLASTY Right 11/19/2012  ? Procedure: RIGHT TOTAL KNEE ARTHROPLASTY;  Surgeon: Gearlean Alf, MD;  Location: [facility];  Service: Orthopedics;  Laterality: Right;  ? TOTAL KNEE REVISION Right 09/24/2014  ? Procedure: RIGHT TOTAL KNEE ARTHROPLASTY REVISION;  Surgeon: Gearlean Alf, MD;  Location: [facility];  Service: Orthopedics;  Laterality: Right;  ? TUBAL LIGATION    ? ? ?OB History   ?No obstetric history on file. ?  ? ? ? ?Home Medications   ? ?Prior to Admission medications   ?Medication Sig Start Date End Date Taking? Authorizing Provider  ?cetirizine  (ZYRTEC) 10 MG tablet Take 1 tablet (10 mg total) by mouth daily. 11/10/21  Yes Verlee Pope-Warren, Alda Lea, NP  ?fluticasone (FLONASE) 50 MCG/ACT nasal spray Place 2 sprays into both nostrils daily for 14 days. 11/10/21 11/24/21 Yes Taden Witter-Warren, Alda Lea, NP  ?promethazine-dextromethorphan (PROMETHAZINE-DM) 6.25-15 MG/5ML syrup Take 5 mLs by mouth 4 (four) times daily as needed for up to 7 days for cough. 11/10/21 11/17/21 Yes Yoali Conry-Warren, Alda Lea, NP  ?rivaroxaban (XARELTO) 20 MG TABS tablet Take 1 tablet by mouth daily with supper. 05/21/19  Yes [provider]  ?acetaminophen (TYLENOL) 325 MG tablet Take 650 mg by mouth every 6 (six) hours as needed for moderate pain (Right knee pain).    [provider]  ?albuterol (PROVENTIL) (2.5 MG/3ML) 0.083% nebulizer solution USE 1 VIAL VIA NEBULIZER  EVERY 4 HOURS AS NEEDED FOR WHEEZING 05/30/17   Diallo, Abdoulaye, MD  ?albuterol (VENTOLIN HFA) 108 (90 Base) MCG/ACT inhaler INHALE 2 PUFFS EVERY 4 HOURS AS NEEDED FOR WHEEZING 08/27/21   Lilland, Alana, DO  ?Blood Glucose Monitoring Suppl (ONE TOUCH ULTRA 2) w/Device KIT Use to check blood sugar as directed. 08/31/18   Martyn Malay, MD  ?citalopram (CELEXA) 20 MG tablet Take 1 tablet (20 mg total) by mouth every morning. 10/11/21   Sharion Settler, DO  ?[facility] MELATONIN 3 MG TABS tablet TAKE 1 TABLET BY MOUTH EVERYDAY AT BEDTIME 05/06/21   Alcus Dad, MD  ?empagliflozin (JARDIANCE) 10 MG TABS tablet Take 1 tablet (10 mg total) by mouth daily before breakfast. 10/27/21   Lilland, Alana, DO  ?EPIPEN 2-PAK 0.3 MG/0.3ML SOAJ injection Inject 0.3 mLs (0.3 mg  total) into the muscle as  needed for severe allergic  reaction, and contact  medical provider. ?Patient not taking: Reported on 06/16/2021 10/27/15   Olam Idler, MD  ?famotidine (PEPCID) 20 MG tablet TAKE 1 TABLET BY MOUTH TWICE  DAILY 11/09/21   Lilland, Alana, DO  ?fluticasone (FLOVENT HFA) 110 MCG/ACT inhaler USE 1 INHALATION BY MOUTH  TWICE DAILY  Strength: 110 MCG/ACT 08/23/21   Lilland, Alana, DO  ?furosemide (LASIX) 20 MG tablet TAKE 1 TABLET BY MOUTH IN  THE MORNING 06/15/21   Lilland, Alana, DO  ?glucose blood (ONE TOUCH ULTRA TEST) test strip Use for blood sugar testing 1-3 times daily 08/15/19   Matilde Haymaker, MD  ?insulin glargine (LANTUS SOLOSTAR) 100 UNIT/ML Solostar Pen Inject 38 Units into the skin daily.    [provider]  ?insulin lispro (HUMALOG KWIKPEN) 100 UNIT/ML KwikPen Inject 12 Units into the skin 2 (two) times daily before a meal. 09/06/21   Lilland, Alana, DO  ?Insulin Pen Needle (BD PEN NEEDLE NANO U/F) 32G X 4 MM MISC Use as directed with insulin four times daily 08/18/21   McDiarmid, Blane Ohara, MD  ?Lancets MISC Use with blood  sugar monitor to check daily 07/07/21   Martyn Malay, MD  ?LANTUS SOLOSTAR 100 UNIT/ML Solostar Pen INJECT SUBCUTANEOUSLY 40  UNITS DAILY 07/08/21   Lilland, Alana, DO  ?levothyroxine (SYNTHROID) 112 MCG tablet TAKE 1 TABLET (112 MCG TOTAL) BY MOUTH EVERY MORNING. [place] 03/15/21   Lilland, Alana, DO  ?losartan (COZAAR) 25 MG tablet TAKE 1 TABLET BY MOUTH  DAILY 06/15/21   Lilland, Alana, DO  ?metFORMIN (GLUCOPHAGE-XR) 750 MG 24 hr tablet TAKE 1 TABLET BY MOUTH TWICE  DAILY WITH A MEAL 10/20/21   Lilland, Alana, DO  ?methocarbamol (ROBAXIN) 500 MG tablet Take 1 tablet (500 mg total) by mouth every 6 (six) hours as needed for muscle spasms. ?Patient not taking: Reported on 07/07/2021 09/08/17   Marjie Skiff, MD  ?Omega-3 Fatty Acids (FISH OIL) 1000 MG CAPS Take 1 capsule by mouth every morning. Reported on 02/11/2016    [provider]  ?omeprazole (PRILOSEC) 20 MG capsule TAKE 1 CAPSULE BY MOUTH  TWICE DAILY 02/01/21   Matilde Haymaker, MD  ?polyethylene glycol powder (GLYCOLAX/MIRALAX) powder TAKE 17 GRAMS BY MOUTH 2  TIMES DAILY AS NEEDED FOR  LAXATIVE 10/23/17   Diallo, Earna Coder, MD  ?potassium chloride SA (KLOR-CON) 20 MEQ tablet TAKE 1 TABLET BY MOUTH  DAILY 11/05/20   Matilde Haymaker, MD   ?PRODIGY NO CODING BLOOD GLUC test strip USE THREE TIMES A DAY TO CHECK ON BLOOD GLUCOSE LEVEL 10/02/20   Matilde Haymaker, MD  ?propranolol (INDERAL) 40 MG tablet TAKE 1 TABLET BY MOUTH  DAILY 02/01/21   Pilar Plate,

## 2021-11-10 NOTE — Discharge Instructions (Addendum)
Your rapid strep test is negative.  Awaiting COVID results.  I have also ordered a throat culture, if your results are positive you will be contacted and provided treatment. ?Increase fluids and get plenty of rest. ?Continue warm salt water gargles 3-4 times daily to help with symptoms. ?May take Tylenol extra strength 500 mg 1 to 2 tablets every 8 hours to help with throat pain. ?Follow-up if symptoms worsen or do not improve. ? ? ?

## 2021-11-11 LAB — SARS CORONAVIRUS 2 (TAT 6-24 HRS): SARS Coronavirus 2: NEGATIVE

## 2021-11-13 LAB — CULTURE, GROUP A STREP (THRC)

## 2021-11-15 NOTE — Patient Instructions (Incomplete)
Thank you for coming to see me today. It was a pleasure. Today we talked about:  ? ?*** ? ?Please follow-up with *** in *** ? ?If you have any questions or concerns, please do not hesitate to call the office at [phone]. ? ?Best,  ? ?Carollee Leitz, MD   ?

## 2021-11-15 NOTE — Progress Notes (Deleted)
    SUBJECTIVE:   CHIEF COMPLAINT / HPI:   ***  PERTINENT  PMH / PSH: ***  OBJECTIVE:   There were no vitals taken for this visit.   General: Alert, no acute distress Cardio: Normal S1 and S2, RRR, no r/m/g Pulm: CTAB, normal work of breathing Abdomen: Bowel sounds normal. Abdomen soft and non-tender.  Extremities: No peripheral edema.  Neuro: Cranial nerves grossly intact   ASSESSMENT/PLAN:   No problem-specific Assessment & Plan notes found for this encounter.     Raizy Auzenne, MD [facility]   

## 2021-11-16 ENCOUNTER — Ambulatory Visit (INDEPENDENT_AMBULATORY_CARE_PROVIDER_SITE_OTHER): Payer: Medicare Other | Admitting: Family Medicine

## 2021-11-16 ENCOUNTER — Encounter: Payer: Self-pay | Admitting: Family Medicine

## 2021-11-16 ENCOUNTER — Ambulatory Visit: Payer: Medicare Other | Admitting: Family Medicine

## 2021-11-16 VITALS — BP 125/56 | HR 77 | Ht 62.0 in | Wt 139.4 lb

## 2021-11-16 DIAGNOSIS — R059 Cough, unspecified: Secondary | ICD-10-CM

## 2021-11-16 DIAGNOSIS — J029 Acute pharyngitis, unspecified: Secondary | ICD-10-CM | POA: Diagnosis not present

## 2021-11-16 DIAGNOSIS — E119 Type 2 diabetes mellitus without complications: Secondary | ICD-10-CM

## 2021-11-16 DIAGNOSIS — Z794 Long term (current) use of insulin: Secondary | ICD-10-CM | POA: Diagnosis not present

## 2021-11-16 DIAGNOSIS — R197 Diarrhea, unspecified: Secondary | ICD-10-CM

## 2021-11-16 LAB — POCT GLYCOSYLATED HEMOGLOBIN (HGB A1C): HbA1c, POC (controlled diabetic range): 8.9 % — AB (ref 0.0–7.0)

## 2021-11-16 LAB — POCT RAPID STREP A (OFFICE): Rapid Strep A Screen: NEGATIVE

## 2021-11-16 MED ORDER — AZITHROMYCIN 250 MG PO TABS
ORAL_TABLET | ORAL | 0 refills | Status: DC
Start: 2021-11-16 — End: 2022-03-23

## 2021-11-16 NOTE — Patient Instructions (Signed)
Diarrhea, Adult °Diarrhea is when you pass loose and watery poop (stool) often. Diarrhea can make you feel weak and cause you to lose water in your body (get dehydrated). Losing water in your body can cause you to: °Feel tired and thirsty. °Have a dry mouth. °Go pee (urinate) less often. °Diarrhea often lasts 2-3 days. However, it can last longer if it is a sign of something more serious. It is important to treat your diarrhea as told by your doctor. °Follow these instructions at home: °Eating and drinking °  °Follow these instructions as told by your doctor: °Take an ORS (oral rehydration solution). This is a drink that helps you replace fluids and minerals your body lost. It is sold at pharmacies and stores. °Drink plenty of fluids, such as: °Water. °Ice chips. °Diluted fruit juice. °Low-calorie sports drinks. °Milk, if you want. °Avoid drinking fluids that have a lot of sugar or caffeine in them. °Eat bland, easy-to-digest foods in small amounts as you are able. These foods include: °Bananas. °Applesauce. °Rice. °Low-fat (lean) meats. °Toast. °Crackers. °Avoid alcohol. °Avoid spicy or fatty foods. ° °Medicines °Take over-the-counter and prescription medicines only as told by your doctor. °If you were prescribed an antibiotic medicine, take it as told by your doctor. Do not stop using the antibiotic even if you start to feel better. °General instructions ° °Wash your hands often using soap and water. If soap and water are not available, use a hand sanitizer. Others in your home should wash their hands as well. Hands should be washed: °After using the toilet or changing a diaper. °Before preparing, cooking, or serving food. °While caring for a sick person. °While visiting someone in a hospital. °Drink enough fluid to keep your pee (urine) pale yellow. °Rest at home while you get better. °Take a warm bath to help with any burning or pain from having diarrhea. °Watch your condition for any changes. °Keep all  follow-up visits as told by your doctor. This is important. °Contact a doctor if: °You have a fever. °Your diarrhea gets worse. °You have new symptoms. °You cannot keep fluids down. °You feel light-headed or dizzy. °You have a headache. °You have muscle cramps. °Get help right away if: °You have chest pain. °You feel very weak or you pass out (faint). °You have bloody or black poop or poop that looks like tar. °You have very bad pain, cramping, or bloating in your belly (abdomen). °You have trouble breathing or you are breathing very quickly. °Your heart is beating very quickly. °Your skin feels cold and clammy. °You feel confused. °You have signs of losing too much water in your body, such as: °Dark pee, very little pee, or no pee. °Cracked lips. °Dry mouth. °Sunken eyes. °Sleepiness. °Weakness. °Summary °Diarrhea is when you pass loose and watery poop (stool) often. °Diarrhea can make you feel weak and cause you to lose water in your body (get dehydrated). °Take an ORS (oral rehydration solution). This is a drink that is sold at pharmacies and stores. °Eat bland, easy-to-digest foods in small amounts as you are able. °Contact a doctor if your condition gets worse. Get help right away if you have signs that you have lost too much water in your body. °This information is not intended to replace advice given to you by your health care provider. Make sure you discuss any questions you have with your health care provider. °Document Revised: 02/03/2021 Document Reviewed: 02/03/2021 °Elsevier Patient Education © 2022 [employer] ° °

## 2021-11-16 NOTE — Progress Notes (Signed)
? ? ?SUBJECTIVE:  ? ?CHIEF COMPLAINT / HPI:  ? ?Sore Throat  ?This is a new problem. Episode onset: Started 2 weeks ago. The problem has been gradually worsening. There has been no fever. Associated symptoms include coughing, diarrhea and ear pain. Pertinent negatives include no abdominal pain, ear discharge, headaches, hoarse voice, shortness of breath or vomiting. Associated symptoms comments: Diarrhea x 1 week. She passes watery stool twice daily.Marland Kitchen  ?Cough ?This is a new problem. Episode onset: This started 2 weeks ago. The problem has been gradually worsening. The problem occurs constantly. Cough characteristics: Cough is productive of a thick yellow sputum. Associated symptoms include ear pain, a sore throat and wheezing. Pertinent negatives include no chest pain, fever, headaches, nasal congestion or shortness of breath. Nothing aggravates the symptoms. Risk factors: Doristine Devoid grandson had a cough and was around her a few weeks ago. Treatments tried: promethazine-dextromethorphan. She also uses her albuterol more often. The treatment provided no relief. Her past medical history is significant for COPD.  ?Diarrhea  ?This is a new problem. Episode onset: 1 week ago. The problem has been unchanged (She moves her bowel twice a day). Diarrhea characteristics: No blood in her stool. Associated symptoms include coughing and a URI. Pertinent negatives include no abdominal pain, fever, headaches or vomiting.  ? ?DM:  ?She is compliant with her Jardiance 10 mg QD and Lantus 38 units QD. She confirms she takes Humalog 9 units TID as opposed to the 12  units BID listed on her med list. In addition, she is also on Metformin 750 mg XR daily. Her home CBGs range from 97 - 312. No other concerns.  ? ?PERTINENT  PMH / PSH: PMHx reviewed ? ?OBJECTIVE:  ? ?BP (!) 125/56   Pulse 77   Ht '5\' 2"'$  (1.575 m)   Wt 139 lb 6.4 oz (63.2 kg)   SpO2 100%   BMI 25.50 kg/m?   ?Physical Exam ?Vitals and nursing note reviewed.   ?Constitutional:   ?   General: She is not in acute distress. ?   Appearance: She is not ill-appearing.  ?HENT:  ?   Right Ear: Tympanic membrane normal.  ?   Left Ear: Tympanic membrane and ear canal normal.  ?   Ears:  ?   Comments: Very mildly inflamed right ear canal.  ?   Mouth/Throat:  ?   Pharynx: Oropharynx is clear. No oropharyngeal exudate.  ?Eyes:  ?   Conjunctiva/sclera: Conjunctivae normal.  ?Cardiovascular:  ?   Rate and Rhythm: Normal rate and regular rhythm.  ?   Heart sounds: Normal heart sounds. No murmur heard. ?Pulmonary:  ?   Effort: Pulmonary effort is normal. No respiratory distress.  ?   Breath sounds: Normal breath sounds. No wheezing.  ?Lymphadenopathy:  ?   Cervical: No cervical adenopathy.  ? ? ? ?ASSESSMENT/PLAN:  ?Cough:  ?ED report and lab from her recent visit were reviewed with negative COVID and strep tests. ?Her symptom is worsening. Although, she does not have respiratory distress. ?Likely viral. However, given duration and worsening of her symptoms, we opted to treat for bacterial bronchitis. ?Zpak prescribed. ?Continue cough regimen as prescribed by the ED. ?Consider chest x-ray in the future if there is still no improvement. ?ED  ? ?Pharyngitis: ?Rapid strep was repeated and was negative. ?Likely viral pharyngitis vs. related to her excessive cough. ?Continue Tylenol or Ibuprofen prn pain. ?Monitor for improvement. ? ?Diarrhea:  ?Likely viral. ?Hydration encouraged. ?Monitor for improvement. ? ?DM2: ?A1C  checked today improved to 8.9. ?Her goal is between 7.5 and 8. ?I discussed medication adjustment today but she declined. ?She prefers to f/u with her PCP for DM management. ?She will schedule PCP f/u. ? ? ?Andrena Mews, MD ?[facility]  ? ?

## 2021-11-16 NOTE — Assessment & Plan Note (Signed)
A1C checked today improved to 8.9. ?Her goal is between 7.5 and 8. ?I discussed medication adjustment today but she declined. ?She prefers to f/u with her PCP for DM management. ?She will schedule PCP f/u. ?

## 2021-11-17 ENCOUNTER — Ambulatory Visit (INDEPENDENT_AMBULATORY_CARE_PROVIDER_SITE_OTHER): Payer: Medicare Other

## 2021-11-17 DIAGNOSIS — I442 Atrioventricular block, complete: Secondary | ICD-10-CM | POA: Diagnosis not present

## 2021-11-17 LAB — CUP PACEART REMOTE DEVICE CHECK
Battery Impedance: 306 Ohm
Battery Remaining Longevity: 115 mo
Battery Voltage: 2.78 V
Brady Statistic RV Percent Paced: 100 %
Date Time Interrogation Session: 20230412084537
Implantable Lead Implant Date: 19990203
Implantable Lead Implant Date: 19990203
Implantable Lead Location: 753859
Implantable Lead Location: 753860
Implantable Lead Model: 5068
Implantable Lead Model: 5092
Implantable Pulse Generator Implant Date: 20180711
Lead Channel Impedance Value: 67 Ohm
Lead Channel Impedance Value: 696 Ohm
Lead Channel Pacing Threshold Amplitude: 0.75 V
Lead Channel Pacing Threshold Pulse Width: 0.4 ms
Lead Channel Setting Pacing Amplitude: 2.5 V
Lead Channel Setting Pacing Pulse Width: 0.4 ms
Lead Channel Setting Sensing Sensitivity: 4 mV

## 2021-11-24 DIAGNOSIS — E119 Type 2 diabetes mellitus without complications: Secondary | ICD-10-CM | POA: Diagnosis not present

## 2021-12-01 ENCOUNTER — Other Ambulatory Visit: Payer: Self-pay

## 2021-12-01 DIAGNOSIS — I10 Essential (primary) hypertension: Secondary | ICD-10-CM

## 2021-12-01 MED ORDER — POTASSIUM CHLORIDE CRYS ER 20 MEQ PO TBCR: 20.0000 meq | EXTENDED_RELEASE_TABLET | Freq: Every day | ORAL | 3 refills | Status: DC

## 2021-12-03 NOTE — Progress Notes (Signed)
Remote pacemaker transmission.   

## 2021-12-07 ENCOUNTER — Ambulatory Visit (INDEPENDENT_AMBULATORY_CARE_PROVIDER_SITE_OTHER): Payer: Medicare Other | Admitting: Family Medicine

## 2021-12-07 ENCOUNTER — Encounter: Payer: Self-pay | Admitting: Family Medicine

## 2021-12-07 VITALS — BP 111/62 | HR 74 | Ht 62.0 in | Wt 141.8 lb

## 2021-12-07 DIAGNOSIS — R6889 Other general symptoms and signs: Secondary | ICD-10-CM

## 2021-12-07 DIAGNOSIS — E119 Type 2 diabetes mellitus without complications: Secondary | ICD-10-CM

## 2021-12-07 DIAGNOSIS — J42 Unspecified chronic bronchitis: Secondary | ICD-10-CM

## 2021-12-07 DIAGNOSIS — Z794 Long term (current) use of insulin: Secondary | ICD-10-CM | POA: Diagnosis not present

## 2021-12-07 MED ORDER — METFORMIN HCL ER 500 MG PO TB24: 1000.0000 mg | ORAL_TABLET | Freq: Every day | ORAL | 3 refills | Status: DC

## 2021-12-07 MED ORDER — GUAIFENESIN 100 MG/5ML PO LIQD: 5.0000 mL | ORAL | 0 refills | Status: DC | PRN

## 2021-12-07 MED ORDER — EMPAGLIFLOZIN 25 MG PO TABS: 25.0000 mg | ORAL_TABLET | Freq: Every day | ORAL | 3 refills | Status: DC

## 2021-12-07 NOTE — Assessment & Plan Note (Signed)
Stable.  Taking current medications.  Last month completed a Z-Pak for a new cough that has been worsening.  Her cough has since improved and she is not using her albuterol any further. ?- Counseled on expectations regarding coughs ?- Prescribed Robitussin ?- Encourage incentive spirometer use as needed ?

## 2021-12-07 NOTE — Assessment & Plan Note (Signed)
Patient is compliant with all of her medications.  A1c was most recently elevated in April at 8.9.  Will attempt to titrate medications to doses that will allow for combination pills. ?- Continue Lantus 38 units ?- Continue Humalog 9 units 3 times daily before meals ?- Increase Jardiance to 25 mg daily ?- Metformin dose adjusted to 1000 mg once daily ?- BMP at next visit ?- Follow-up in 3 months for A1c ?

## 2021-12-07 NOTE — Progress Notes (Signed)
? ? ?  SUBJECTIVE:  ? ?CHIEF COMPLAINT / HPI:  ? ?Patient was that she was seen a couple weeks ago and is continued to have a little bit of cough since then.  She is coughing up some white phlegm it is most bothersome at night.  She is taking all of her medications as prescribed including her inhalers.  She does note that she has a spirometer at home. ? ?Patient also reports that she is concerned about dementia as she has episodes where she forgets that she turned on the stove, she will walk into another room and forget what she went in there for (notes that she might remember it later).  Her daughter is also noticed her forgetting some of these things, patient is not unaware of the things that she has forgotten as well.  She has not had any hallucinations and is overall still functioning very well ? ?Patient also notes that did not change her diabetes regimen the last time she was in although her A1c was still elevated.  She is agreeable to changing her medications today. ? ? ?PERTINENT  PMH / PSH: Reviewed ? ?OBJECTIVE:  ? ?BP 111/62   Pulse 74   Ht '5\' 2"'$  (1.575 m)   Wt 141 lb 12.8 oz (64.3 kg)   SpO2 96%   BMI 25.94 kg/m?   ?Gen: well-appearing, NAD ?CV: RRR, no m/r/g appreciated, no peripheral edema ?Pulm: CTAB, no wheezes/crackles ?GI: soft, non-tender, non-distended ? ?ASSESSMENT/PLAN:  ? ?DM (diabetes mellitus), type 2 ([place]) ?Patient is compliant with all of her medications.  A1c was most recently elevated in April at 8.9.  Will attempt to titrate medications to doses that will allow for combination pills. ?- Continue Lantus 38 units ?- Continue Humalog 9 units 3 times daily before meals ?- Increase Jardiance to 25 mg daily ?- Metformin dose adjusted to 1000 mg once daily ?- BMP at next visit ?- Follow-up in 3 months for A1c ? ?COPD (chronic obstructive pulmonary disease) ?Stable.  Taking current medications.  Last month completed a Z-Pak for a new cough that has been worsening.  Her cough has since  improved and she is not using her albuterol any further. ?- Counseled on expectations regarding coughs ?- Prescribed Robitussin ?- Encourage incentive spirometer use as needed ?  ?Forgetfulness ?No concern for Alzheimer's dementia at this time, could have some mild memory impairment but does not seem to be affecting daily life at this time. ?- Patient will continue to monitor ?- Follow-up as needed if worsening or concerns ? ? ?Mercedes Boline, DO ?[place]  ?

## 2021-12-07 NOTE — Patient Instructions (Signed)
For your cough, I have sent in Robitussin cough syrup.  Because you have a history of COPD, your cough can last a lot longer.  As long as you are not coughing up more volume or changing colors or struggling to breathe we are doing okay.  I do recommend try to use that spirometer the abdomen as well. ? ?- For your diabetes medications, we are going to change your metformin dose to 1000 mg once a day, the prescription has been sent in.  We are also increasing your dose of Jardiance from 10 mg to 25 mg. ? ?-Lets follow-up in the next 2 to 3 months (around July) to repeat your A1c and see how you are doing. ? ?- I do not think you are having concerning dementia such as Alzheimer's.  A lot of the things you are having now do seem normal.  It would be important to set up reminders or checklists that before you leave the house you check that certain things are done such as turning off the stove, unplugging things, etc. ?

## 2021-12-14 ENCOUNTER — Telehealth: Payer: Self-pay

## 2021-12-14 NOTE — Telephone Encounter (Signed)
Patient calls nurse line requesting PCP opinion on OTC vitamin.  ? ?Patient reports she would like to start taking something that will help with her overall energy level.  ? ?Patient is afraid to try one without PCP recommendation, as she does not want any interactions with her current medications.  ? ?Will forward to PCP.  ?

## 2021-12-16 NOTE — Telephone Encounter (Signed)
Called patient regarding request and recommended that patient take a generic multivitamin as long as it does not have excessive potassium given her current supplementation. Patient can also bring her vitamin with her to her next appointment if she is concerned and we can look at it together.  ? ? ?Andretta Ergle, DO  ?

## 2021-12-21 ENCOUNTER — Other Ambulatory Visit: Payer: Self-pay

## 2021-12-21 DIAGNOSIS — I10 Essential (primary) hypertension: Secondary | ICD-10-CM

## 2021-12-21 DIAGNOSIS — H401132 Primary open-angle glaucoma, bilateral, moderate stage: Secondary | ICD-10-CM | POA: Diagnosis not present

## 2021-12-21 MED ORDER — POTASSIUM CHLORIDE CRYS ER 20 MEQ PO TBCR: 20.0000 meq | EXTENDED_RELEASE_TABLET | Freq: Every day | ORAL | 3 refills | Status: DC

## 2021-12-22 DIAGNOSIS — Z7901 Long term (current) use of anticoagulants: Secondary | ICD-10-CM | POA: Diagnosis not present

## 2021-12-22 DIAGNOSIS — Z86010 Personal history of colonic polyps: Secondary | ICD-10-CM | POA: Diagnosis not present

## 2021-12-24 DIAGNOSIS — E119 Type 2 diabetes mellitus without complications: Secondary | ICD-10-CM | POA: Diagnosis not present

## 2021-12-28 ENCOUNTER — Other Ambulatory Visit: Payer: Self-pay | Admitting: Family Medicine

## 2022-01-04 ENCOUNTER — Other Ambulatory Visit: Payer: Self-pay

## 2022-01-06 MED ORDER — PROPRANOLOL HCL 40 MG PO TABS: 40.0000 mg | ORAL_TABLET | Freq: Every day | ORAL | 3 refills | Status: DC

## 2022-01-10 ENCOUNTER — Telehealth: Payer: Self-pay

## 2022-01-10 DIAGNOSIS — E119 Type 2 diabetes mellitus without complications: Secondary | ICD-10-CM

## 2022-01-10 NOTE — Telephone Encounter (Signed)
Patient calls nurse line reporting she received 31G x 5 mm pen needles. Patent reports she needs 31G x 4 mm pen needles.   Patient reports she did pick up prescription, however she plans to return to pharmacy as she has not opened the box yet.   Will forward to PCP.

## 2022-01-11 ENCOUNTER — Other Ambulatory Visit: Payer: Self-pay | Admitting: Family Medicine

## 2022-01-11 MED ORDER — INSULIN PEN NEEDLE 31G X 4 MM MISC: 1.0000 | Freq: Every day | 12 refills | Status: DC

## 2022-01-24 DIAGNOSIS — E119 Type 2 diabetes mellitus without complications: Secondary | ICD-10-CM | POA: Diagnosis not present

## 2022-02-01 ENCOUNTER — Other Ambulatory Visit: Payer: Self-pay | Admitting: Family Medicine

## 2022-02-01 DIAGNOSIS — I5030 Unspecified diastolic (congestive) heart failure: Secondary | ICD-10-CM

## 2022-02-01 DIAGNOSIS — I10 Essential (primary) hypertension: Secondary | ICD-10-CM

## 2022-02-16 ENCOUNTER — Ambulatory Visit (INDEPENDENT_AMBULATORY_CARE_PROVIDER_SITE_OTHER): Payer: Medicare Other

## 2022-02-16 DIAGNOSIS — I442 Atrioventricular block, complete: Secondary | ICD-10-CM | POA: Diagnosis not present

## 2022-02-16 LAB — CUP PACEART REMOTE DEVICE CHECK
Battery Impedance: 331 Ohm
Battery Remaining Longevity: 110 mo
Battery Voltage: 2.77 V
Brady Statistic RV Percent Paced: 100 %
Date Time Interrogation Session: 20230712081724
Implantable Lead Implant Date: 19990203
Implantable Lead Implant Date: 19990203
Implantable Lead Location: 753859
Implantable Lead Location: 753860
Implantable Lead Model: 5068
Implantable Lead Model: 5092
Implantable Pulse Generator Implant Date: 20180711
Lead Channel Impedance Value: 631 Ohm
Lead Channel Pacing Threshold Amplitude: 0.75 V
Lead Channel Pacing Threshold Pulse Width: 0.4 ms
Lead Channel Setting Pacing Amplitude: 2.5 V
Lead Channel Setting Pacing Pulse Width: 0.4 ms
Lead Channel Setting Sensing Sensitivity: 4 mV

## 2022-02-19 ENCOUNTER — Encounter (HOSPITAL_COMMUNITY): Payer: Self-pay | Admitting: Emergency Medicine

## 2022-02-19 ENCOUNTER — Ambulatory Visit (HOSPITAL_COMMUNITY)
Admission: EM | Admit: 2022-02-19 | Discharge: 2022-02-19 | Disposition: A | Discharge status: Home / Self Care | Payer: Medicare Other

## 2022-02-19 DIAGNOSIS — T148XXA Other injury of unspecified body region, initial encounter: Secondary | ICD-10-CM | POA: Diagnosis not present

## 2022-02-19 MED ORDER — METHOCARBAMOL 500 MG PO TABS: 500.0000 mg | ORAL_TABLET | Freq: Two times a day (BID) | ORAL | 0 refills | Status: DC | PRN

## 2022-02-19 NOTE — ED Triage Notes (Signed)
Pt c/o pain in left axilla area for about a week. Reports had to move furniture due to leaking ceiling and after that is when pains started.

## 2022-02-19 NOTE — Discharge Instructions (Signed)
You have a muscle strain which is being treated with muscle relaxer.  Please be advised that this can cause drowsiness so do not drive while taking it.  Also recommend that you supplement with Tylenol if necessary.  Alternate ice and heat to affected area.  Follow-up if symptoms persist or worsen.

## 2022-02-19 NOTE — ED Provider Notes (Addendum)
[facility]    CSN: [id number] Arrival date & time: 02/19/22  1006      History   Chief Complaint No chief complaint on file.   HPI Mercedes Barr is a 76 y.o. female.   Patient presents with left-sided underarm pain that has been present for about a week.  Patient reports that she was lifting heavy furniture when pain subsequently started after that.  She has not taken any medication for her pain.  Denies any numbness or tingling.  Patient does have full range of motion.     Past Medical History:  Diagnosis Date   Arthritis 4/14   Tr TKR   Asthma    Complete heart block (HCC)    COPD (chronic obstructive pulmonary disease) ([place])    Coronary artery disease    Diabetes mellitus    GERD (gastroesophageal reflux disease)    Heart murmur    Hypertension    Hypothyroidism    Normal coronary arteries 2011   Pacemaker    PACEMAKER DEPENDENT-DR. [place].  OFFICE NOTE DR. A. LITTLE STATES  "EXTREMELY PACEMAKER SENSITIVE AND WHEN YOU TRY TO CHECK FOR UNDERLYING RHYTHMS SHE WILL HAVE SNYCOPE AND WE HAVE NOT DONE THIS NOW IN ABOUT 2 YEARS".   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   Complete heart block ([place])    Normal coronary arteries-Feb 2011    Pacemaker    Weight loss, unintentional 12/21/2012   Osteoarthritis of right knee 08/22/2012    Insomnia 03/08/2012   Allergy to bee sting 10/04/2011   Low back pain 09/07/2011   Hypothyroidism 12/28/2009   GLAUCOMA 12/25/2009   GERD 01/19/2009   COPD (chronic obstructive pulmonary disease) ([place]) 11/06/2008   Abnormal involuntary movement 09/03/2008   Essential hypertension 06/14/2007   DM (diabetes mellitus), type 2 ([place]) 11/06/2006   DEPRESSION 11/06/2006   Hyperlipidemia 11/03/2006    Past Surgical History:  Procedure Laterality Date   ATRIOVENTRICULAR CUSHION DEFECT REPAIR  1988   CARDIAC CATHETERIZATION  2011    SHOWED NO CAD-PER CARDIOLOGY OFFICE NOTES DR. A. LITTLE   colonscopy      removed polyps    INSERT / REPLACE / REMOVE PACEMAKER     PACEMAKER INSERTION  1988   last gen 11/08- MDT   PPM GENERATOR CHANGEOUT N/A 02/15/2017   Procedure: PPM Generator Changeout;  Surgeon: Sanda Klein, MD;  Location: [facility];  Service: Cardiovascular;  Laterality: N/A;   TOTAL KNEE ARTHROPLASTY Right 11/19/2012   Procedure: RIGHT TOTAL KNEE ARTHROPLASTY;  Surgeon: Gearlean Alf, MD;  Location: [facility];  Service: Orthopedics;  Laterality: Right;   TOTAL KNEE REVISION Right 09/24/2014   Procedure: RIGHT TOTAL KNEE ARTHROPLASTY REVISION;  Surgeon: Gearlean Alf, MD;  Location: [facility];  Service: Orthopedics;  Laterality: Right;   TUBAL LIGATION      OB History  No obstetric history on file.      Home Medications    Prior to Admission medications   Medication Sig Start Date End Date Taking? Authorizing Provider  methocarbamol (ROBAXIN) 500 MG tablet Take 1 tablet (500 mg total) by mouth 2 (two) times daily as needed for muscle spasms. 02/19/22  Yes Monserrate Blaschke, Michele Rockers, FNP  acetaminophen (TYLENOL) 325 MG tablet Take 650 mg by mouth every 6 (six) hours as needed for moderate pain (Right knee pain).    [provider]  albuterol (PROVENTIL) (2.5 MG/3ML) 0.083% nebulizer solution USE 1 VIAL VIA NEBULIZER  EVERY 4 HOURS AS NEEDED FOR WHEEZING 05/30/17   Diallo,  Abdoulaye, MD  albuterol (VENTOLIN HFA) 108 (90 Base) MCG/ACT inhaler INHALE 2 PUFFS EVERY 4 HOURS AS NEEDED FOR WHEEZING 08/27/21   Lilland, Alana, DO  apixaban (ELIQUIS) 5 MG TABS tablet Take 5 mg by mouth daily at 2 PM.    [provider]  azithromycin (ZITHROMAX Z-PAK) 250 MG tablet Take two tablets today and subsequently one table daily for four days. 11/16/21   Kinnie Feil, MD  Blood Glucose Monitoring Suppl (ONE TOUCH ULTRA 2) w/Device KIT Use to check blood sugar as directed. 08/31/18   Martyn Malay, MD  cetirizine (ZYRTEC) 10 MG tablet Take 1 tablet (10 mg total) by mouth daily. Patient not taking: Reported on 11/16/2021 11/10/21   Leath-Warren, Alda Lea, NP  citalopram (CELEXA) 20 MG tablet Take 1 tablet (20 mg total) by mouth every morning. 10/11/21   Sharion Settler, DO  [facility] MELATONIN 3 MG TABS tablet TAKE 1 TABLET BY MOUTH EVERYDAY AT BEDTIME Patient not taking: Reported on 11/16/2021 05/06/21   Alcus Dad, MD  empagliflozin (JARDIANCE) 10 MG TABS tablet Take 1 tablet (10 mg total) by mouth daily before breakfast. 10/27/21   Lilland, Alana, DO  empagliflozin (JARDIANCE) 25 MG TABS tablet Take 1 tablet (25 mg total) by mouth daily. 12/07/21   Lilland, Alana, DO  EPIPEN 2-PAK 0.3 MG/0.3ML SOAJ injection Inject 0.3 mLs (0.3 mg  total) into the muscle as  needed for severe allergic  reaction, and contact  medical provider. Patient not taking: Reported on 06/16/2021 10/27/15   Olam Idler, MD  famotidine (PEPCID) 20 MG tablet TAKE 1 TABLET BY MOUTH TWICE  DAILY 11/09/21   Lilland, Alana, DO  fluticasone (FLONASE) 50 MCG/ACT nasal spray Place 2 sprays into both nostrils daily for 14 days. 11/10/21 11/24/21  Leath-Warren, Alda Lea, NP  fluticasone (FLOVENT HFA) 110 MCG/ACT inhaler USE 1 INHALATION BY MOUTH  TWICE DAILY Strength: 110 MCG/ACT 08/23/21   Lilland, Alana, DO  furosemide (LASIX) 20 MG tablet TAKE 1 TABLET BY MOUTH IN THE  MORNING 02/02/22   Lilland, Alana, DO  glucose  blood (ONE TOUCH ULTRA TEST) test strip Use for blood sugar testing 1-3 times daily 08/15/19   Matilde Haymaker, MD  guaiFENesin (ROBITUSSIN) 100 MG/5ML liquid Take 5 mLs by mouth every 4 (four) hours as needed for cough or to loosen phlegm. 12/07/21   Lilland, Alana, DO  insulin glargine (LANTUS SOLOSTAR) 100 UNIT/ML Solostar Pen Inject 38 Units into the skin daily.    [provider]  insulin lispro (HUMALOG KWIKPEN) 100 UNIT/ML KwikPen Inject 12 Units into the skin 2 (two) times daily before a meal. 09/06/21   Lilland, Alana, DO  Insulin Pen Needle (BD PEN NEEDLE NANO U/F) 32G X 4 MM MISC Use as directed with insulin four times daily 08/18/21   McDiarmid, Blane Ohara,  MD  Insulin Pen Needle 31G X 4 MM MISC 1 each by Does not apply route daily. 01/11/22   Rise Patience, DO  Lancets MISC Use with blood sugar monitor to check daily 07/07/21   Martyn Malay, MD  levothyroxine (SYNTHROID) 112 MCG tablet TAKE 1 TABLET (112 MCG TOTAL) BY MOUTH EVERY MORNING. [place] 03/15/21   Lilland, Alana, DO  losartan (COZAAR) 25 MG tablet TAKE 1 TABLET BY MOUTH DAILY 02/02/22   Lilland, Alana, DO  metFORMIN (GLUCOPHAGE-XR) 500 MG 24 hr tablet Take 2 tablets (1,000 mg total) by mouth daily with breakfast. 12/07/21   Lilland, Alana, DO  Omega-3 Fatty Acids (FISH OIL) 1000 MG CAPS Take 1 capsule by mouth every morning. Reported on 02/11/2016    [provider]  omeprazole (PRILOSEC) 20 MG capsule TAKE 1 CAPSULE BY MOUTH  TWICE DAILY 02/01/21   Matilde Haymaker, MD  polyethylene glycol powder (GLYCOLAX/MIRALAX) powder TAKE 17 GRAMS BY MOUTH 2  TIMES DAILY AS NEEDED FOR  LAXATIVE 10/23/17   Diallo, Abdoulaye, MD  potassium chloride SA (KLOR-CON M) 20 MEQ tablet Take 1 tablet (20 mEq total) by mouth daily. 12/21/21   Lilland, Alana, DO  PRODIGY NO CODING BLOOD GLUC test strip USE THREE TIMES A DAY TO CHECK ON BLOOD GLUCOSE LEVEL 10/02/20   Matilde Haymaker, MD  propranolol (INDERAL) 40 MG tablet Take 1 tablet (40 mg total)  by mouth daily. 01/06/22   Lilland, Alana, DO  rivaroxaban (XARELTO) 20 MG TABS tablet Take 1 tablet (20 mg total) by mouth daily with supper. 10/11/21   Sharion Settler, DO  rivaroxaban (XARELTO) 20 MG TABS tablet Take 1 tablet by mouth daily with supper. 05/21/19   [provider]  rosuvastatin (CRESTOR) 20 MG tablet Take 1 tablet (20 mg total) by mouth daily. 02/16/21   Rise Patience, DO  Spacer/Aero-Holding Chambers (AEROCHAMBER PLUS) inhaler Use as instructed 06/08/18   Melynda Ripple, MD  Travoprost, BAK Free, (TRAVATAN) 0.004 % SOLN ophthalmic solution Place 1 drop into both eyes at bedtime. 09/15/14   Hilton Sinclair, MD  traZODone (DESYREL) 50 MG tablet TAKE 1 TABLET BY MOUTH AT  BEDTIME AS NEEDED FOR SLEEP Patient not taking: Reported on 11/16/2021 12/18/17   Marjie Skiff, MD  albuterol (PROVENTIL,VENTOLIN) 90 MCG/ACT inhaler Inhale 2 puffs into the lungs every 4 (four) hours as needed for wheezing or shortness of breath. 05/30/11 10/21/11  Cletus Gash, MD    Family History Family History  Problem Relation Age of Onset   Heart failure Mother    Hypertension Mother    Stroke Father    Heart failure Sister    Kidney disease Daughter    Cancer Brother    Breast cancer Neg Hx     Social History Social History   Tobacco Use   Smoking status: Never   Smokeless tobacco: Never  Vaping Use   Vaping Use: Never used  Substance Use Topics   Alcohol use: No   Drug use: No     Allergies   Chlorhexidine, Naproxen, Pregabalin, Statins, Amitriptyline hcl, Benazepril hcl, Gabapentin, Oxycodone-acetaminophen, and Propoxyphene   Review of Systems Review of Systems Per HPI  Physical Exam Triage Vital Signs ED Triage Vitals  Enc Vitals Group     BP 02/19/22 1021 120/74     Pulse Rate 02/19/22 1021 72     Resp 02/19/22 1021 18     Temp 02/19/22 1021 98 F (36.7 C)     Temp src --  SpO2 02/19/22 1021 97 %     Weight --      Height --      Head  Circumference --      Peak Flow --      Pain Score 02/19/22 1019 8     Pain Loc --      Pain Edu? --      Excl. in [place]? --    No data found.  Updated Vital Signs BP 120/74   Pulse 72   Temp 98 F (36.7 C)   Resp 18   SpO2 97%   Visual Acuity Right Eye Distance:   Left Eye Distance:   Bilateral Distance:    Right Eye Near:   Left Eye Near:    Bilateral Near:     Physical Exam Constitutional:      General: She is not in acute distress.    Appearance: Normal appearance. She is not toxic-appearing or diaphoretic.  HENT:     Head: Normocephalic and atraumatic.  Eyes:     Extraocular Movements: Extraocular movements intact.     Conjunctiva/sclera: Conjunctivae normal.  Pulmonary:     Effort: Pulmonary effort is normal.  Chest:       Comments: Tenderness to palpation to left pectoralis muscles that extends into left axilla/left rib area.  No obvious swelling, discoloration, lacerations, abrasions, warmth noted. Neurological:     General: No focal deficit present.     Mental Status: She is alert and oriented to person, place, and time. Mental status is at baseline.  Psychiatric:        Mood and Affect: Mood normal.        Behavior: Behavior normal.        Thought Content: Thought content normal.        Judgment: Judgment normal.      UC Treatments / Results  Labs (all labs ordered are listed, but only abnormal results are displayed) Labs Reviewed - No data to display  EKG   Radiology No results found.  Procedures Procedures (including critical care time)  Medications Ordered in UC Medications - No data to display  Initial Impression / Assessment and Plan / UC Course  I have reviewed the triage vital signs and the nursing notes.  Pertinent labs & imaging results that were available during my care of the patient were reviewed by me and considered in my medical decision making (see chart for details).     Physical exam is consistent with muscle strain.   Patient requesting muscle relaxer and states that she has taken this before and tolerated well.  Will prescribe Robaxin given patient's age.  Advised patient that it can cause drowsiness and do not drive while taking this.  She states that she does not take any other muscle relaxers or sedating medication so this should be safe.  Patient also advised that she may supplement with Tylenol given that she takes Eliquis and NSAIDs are contraindicated.  Do not think imaging is necessary.  Discussed supportive care as well as alternating ice and heat to affected area.  Patient advised to follow-up if symptoms persist or worsen.  Patient verbalized understanding and was agreeable with plan. Final Clinical Impressions(s) / UC Diagnoses   Final diagnoses:  Muscle strain     Discharge Instructions      You have a muscle strain which is being treated with muscle relaxer.  Please be advised that this can cause drowsiness so do not drive while taking it.  Also recommend that you supplement with Tylenol if necessary.  Alternate ice and heat to affected area.  Follow-up if symptoms persist or worsen.    ED Prescriptions     Medication Sig Dispense Auth. Provider   methocarbamol (ROBAXIN) 500 MG tablet Take 1 tablet (500 mg total) by mouth 2 (two) times daily as needed for muscle spasms. 20 tablet [place], Michele Rockers, [place]      PDMP not reviewed this encounter.   Teodora Medici, [place] 02/19/22 1115    Teodora Medici,  02/19/22 1115

## 2022-02-23 DIAGNOSIS — E119 Type 2 diabetes mellitus without complications: Secondary | ICD-10-CM | POA: Diagnosis not present

## 2022-02-24 ENCOUNTER — Other Ambulatory Visit: Payer: Self-pay | Admitting: Family Medicine

## 2022-02-24 MED ORDER — METFORMIN HCL ER 500 MG PO TB24: 1000.0000 mg | ORAL_TABLET | Freq: Every day | ORAL | 3 refills | Status: DC

## 2022-03-01 DIAGNOSIS — K635 Polyp of colon: Secondary | ICD-10-CM | POA: Diagnosis not present

## 2022-03-01 DIAGNOSIS — K5730 Diverticulosis of large intestine without perforation or abscess without bleeding: Secondary | ICD-10-CM | POA: Diagnosis not present

## 2022-03-01 DIAGNOSIS — Z86010 Personal history of colonic polyps: Secondary | ICD-10-CM | POA: Diagnosis not present

## 2022-03-03 DIAGNOSIS — K635 Polyp of colon: Secondary | ICD-10-CM | POA: Diagnosis not present

## 2022-03-07 NOTE — Progress Notes (Signed)
Remote pacemaker transmission.   

## 2022-03-23 ENCOUNTER — Ambulatory Visit (INDEPENDENT_AMBULATORY_CARE_PROVIDER_SITE_OTHER): Payer: Medicare Other | Admitting: Family Medicine

## 2022-03-23 ENCOUNTER — Encounter: Payer: Self-pay | Admitting: Family Medicine

## 2022-03-23 VITALS — BP 123/88 | HR 72 | Ht 62.0 in | Wt 141.0 lb

## 2022-03-23 DIAGNOSIS — B351 Tinea unguium: Secondary | ICD-10-CM

## 2022-03-23 DIAGNOSIS — I10 Essential (primary) hypertension: Secondary | ICD-10-CM

## 2022-03-23 DIAGNOSIS — E119 Type 2 diabetes mellitus without complications: Secondary | ICD-10-CM

## 2022-03-23 DIAGNOSIS — Z794 Long term (current) use of insulin: Secondary | ICD-10-CM | POA: Diagnosis not present

## 2022-03-23 DIAGNOSIS — H401132 Primary open-angle glaucoma, bilateral, moderate stage: Secondary | ICD-10-CM | POA: Diagnosis not present

## 2022-03-23 LAB — POCT GLYCOSYLATED HEMOGLOBIN (HGB A1C): HbA1c, POC (controlled diabetic range): 8.7 % — AB (ref 0.0–7.0)

## 2022-03-23 NOTE — Assessment & Plan Note (Signed)
BP well controlled, 123/88 in the office.  No changes to current regimen.

## 2022-03-23 NOTE — Progress Notes (Signed)
    SUBJECTIVE:   CHIEF COMPLAINT / HPI:   T2DM - Checking BG at home: Yes has CGM, readings have been elevated in the afternoons into the 300s.  No hypoglycemic episodes overnight reassuringly - Medications: Lantus 38 units daily, lispro 9 units 3 times daily with meals, metformin 1000 mg once daily, Jardiance 25 mg daily - Compliance: Overall good, unable to tolerate higher dose of metformin so has not been taking.  Jardiance that was refilled was at lower dose of 10 mg daily. - foot exam:  03/23/2022 done today - denies symptoms of hypoglycemia, polyuria, polydipsia, numbness extremities, foot ulcers/trauma  Toenail discoloration  - Has noticed it for quite some time - Would like to see podiatry  PERTINENT  PMH / PSH: Reviewed  OBJECTIVE:   BP 123/88   Pulse 72   Ht '5\' 2"'$  (1.575 m)   Wt 141 lb (64 kg)   SpO2 98%   BMI 25.79 kg/m   General: NAD, well-appearing, well-nourished Respiratory: No respiratory distress, breathing comfortably, able to speak in full sentences Skin: warm and dry, no rashes noted on exposed skin.  Of note on foot, discoloration of the bilateral great toenails. Psych: Appropriate affect and mood Diabetic Foot Exam - Simple   Simple Foot Form Diabetic Foot exam was performed with the following findings: Yes 03/23/2022 11:20 AM  Visual Inspection No deformities, no ulcerations, no other skin breakdown bilaterally: Yes Sensation Testing Intact to touch and monofilament testing bilaterally: Yes Pulse Check Posterior Tibialis and Dorsalis pulse intact bilaterally: Yes Comments      ASSESSMENT/PLAN:   Essential hypertension BP well controlled, 123/88 in the office.  No changes to current regimen.  DM (diabetes mellitus), type 2 (HCC) A1c today 8.7 (down from 8.9).  Patient having hypoglycemic episodes during the day, will increase the short acting insulin with meals. - Continue Jardiance 25 mg daily - Increase lispro to 12 units 3 times daily  with meals - Monitor CBGs 1 to 2 hours after meals - If having any hypoglycemic episodes, patient to decrease down to 10 units - Continue Lantus 30 units daily - Discontinue metformin due to intolerance   Onychomycosis Foot exam consistent with onychomycosis.  Due to diabetic feet, will have podiatry evaluate and treat. - Podiatry referral placed  Mercedes Barr, [place]

## 2022-03-23 NOTE — Patient Instructions (Signed)
I am going to refer you to podiatry for your feet.  For your insulin working to do the following changes: - Increase your mealtime insulin (the short acting 1) to 12 units with each meal - Check your sugar 1 to 2 hours after eating - If you notice that you are going too low during the day and I feeling symptoms, then decrease the dose of insulin back to 10 instead.  I have sent in the refill for the Jardiance 25 mg.  You can go ahead and take 2 tablets of the '10mg'$  daily to finish them off if you would like to.

## 2022-03-23 NOTE — Assessment & Plan Note (Addendum)
A1c today 8.7 (down from 8.9).  Patient having hypoglycemic episodes during the day, will increase the short acting insulin with meals. - Continue Jardiance 25 mg daily - Increase lispro to 12 units 3 times daily with meals - Monitor CBGs 1 to 2 hours after meals - If having any hypoglycemic episodes, patient to decrease down to 10 units - Continue Lantus 30 units daily - Discontinue metformin due to intolerance

## 2022-03-26 DIAGNOSIS — E119 Type 2 diabetes mellitus without complications: Secondary | ICD-10-CM | POA: Diagnosis not present

## 2022-04-05 ENCOUNTER — Ambulatory Visit (INDEPENDENT_AMBULATORY_CARE_PROVIDER_SITE_OTHER): Payer: Medicare Other | Admitting: Podiatry

## 2022-04-05 ENCOUNTER — Encounter: Payer: Self-pay | Admitting: Podiatry

## 2022-04-05 DIAGNOSIS — M79675 Pain in left toe(s): Secondary | ICD-10-CM | POA: Diagnosis not present

## 2022-04-05 DIAGNOSIS — E119 Type 2 diabetes mellitus without complications: Secondary | ICD-10-CM

## 2022-04-05 DIAGNOSIS — M79674 Pain in right toe(s): Secondary | ICD-10-CM

## 2022-04-05 DIAGNOSIS — B351 Tinea unguium: Secondary | ICD-10-CM

## 2022-04-05 DIAGNOSIS — Z794 Long term (current) use of insulin: Secondary | ICD-10-CM

## 2022-04-05 NOTE — Progress Notes (Signed)
  Subjective:  Patient ID: Mercedes Barr, female    DOB: 1946-06-03,   MRN: [id number]  Chief Complaint  Patient presents with   Nail Problem    Diabetic foot care BS-219 A1C-8.7 PCP-Lilland, Alana Lst Vst- 2 weeks ago    76 y.o. female presents for concern of thickened elongated and painful nails that are difficult to trim. Requesting to have them trimmed today. Relates burning and tingling in their feet. Patient is diabetic and last A1c was  Lab Results  Component Value Date   HGBA1C 8.7 (A) 03/23/2022   .   PCP:  Rise Patience, DO    . Denies any other pedal complaints. Denies n/v/f/c.   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 [place].  OFFICE NOTE DR. A. LITTLE STATES  "EXTREMELY PACEMAKER SENSITIVE AND WHEN YOU TRY TO CHECK FOR UNDERLYING RHYTHMS SHE WILL HAVE SNYCOPE AND WE HAVE NOT DONE THIS NOW IN ABOUT 2 YEARS".   Shortness of breath dyspnea    walking distance or climbing stairs    Objective:  Physical Exam: Vascular: DP/PT pulses 2/4 bilateral. CFT <3 seconds. Absent hair growth on digits. Edema noted to bilateral lower extremities. Xerosis noted bilaterally.  Skin. No lacerations or abrasions bilateral feet. Nails 1-5 bilateral  are thickened discolored and elongated with subungual debris.  Musculoskeletal: MMT 5/5 bilateral lower extremities in DF, PF, Inversion and Eversion. Deceased ROM in DF of ankle joint.  Neurological: Sensation intact to light touch. Protective sensation intact bilateral.    Assessment:   1. Type 2 diabetes mellitus without complication, with long-term current use  of insulin (HCC)   2. Pain due to onychomycosis of toenails of both feet      Plan:  Patient was evaluated and treated and all questions answered. -Discussed and educated patient on diabetic foot care, especially with  regards to the vascular, neurological and musculoskeletal systems.  -Stressed the importance of good glycemic control and the detriment of not  controlling glucose levels in relation to the foot. -Discussed supportive shoes at all times and checking feet regularly.  -Mechanically debrided all nails 1-5 bilateral using sterile nail nipper and filed with dremel without incident  -Answered all patient questions -Patient to return  in 3 months for at risk foot care -Patient advised to call the office if any problems or questions arise in the meantime.   Lorenda Peck, DPM

## 2022-04-20 ENCOUNTER — Telehealth: Payer: Self-pay

## 2022-04-20 MED ORDER — GUAIFENESIN 100 MG/5ML PO SYRP: 200.0000 mg | ORAL_SOLUTION | Freq: Four times a day (QID) | ORAL | 0 refills | Status: DC | PRN

## 2022-04-20 NOTE — Telephone Encounter (Signed)
Please let patient know that the medication has been sent to the pharmacy and that hopefully she gets better soon. If she is not, let me know and I can get her double-booked on my schedule next week.    Paizleigh Wilds, DO

## 2022-04-20 NOTE — Telephone Encounter (Signed)
Patient calls nurse line regarding productive cough. Patient reports that she has been coughing up mucus primarily in the morning and the evenings. She states that she will have random episodes through the year where she "has increased mucus in her throat and chest." Most recent episode has been ongoing for the last two weeks. No fever, chills, body aches, headache, sore throat, SHOB or difficulty breathing.   She reports that she received guaifenesin back in May, which really helped.   She is requesting that this medication be refilled. I also recommended that patient schedule appointment as this has been going on for the last two weeks. Patient scheduled for 05/10/22 with PCP as she did not want to see another provider.   Please advise if medication can be sent to pharmacy until patient can be seen in the office.   Talbot Grumbling, RN

## 2022-04-22 NOTE — Telephone Encounter (Signed)
Called patient and informed. Patient will call back next week if she is not feeling better.   Talbot Grumbling, RN

## 2022-04-25 MED ORDER — GUAIFENESIN 100 MG/5ML PO SYRP
200.0000 mg | ORAL_SOLUTION | Freq: Four times a day (QID) | ORAL | 0 refills | Status: DC | PRN
Start: 2022-04-25 — End: 2022-05-10

## 2022-04-25 NOTE — Telephone Encounter (Signed)
Patient calls nurse line reporting she never received cough syrup.   Advised patient this was sent on 9/13. Patient reports each time I call they tell me they don't have anything.   Called pharmacy and they report no history of 9/13 prescription.   Will resend.

## 2022-04-25 NOTE — Addendum Note (Signed)
Addended by: Dorna Bloom on: 04/25/2022 01:44 PM   Modules accepted: Orders

## 2022-04-26 ENCOUNTER — Ambulatory Visit: Payer: Self-pay | Admitting: Licensed Clinical Social Worker

## 2022-04-26 DIAGNOSIS — E119 Type 2 diabetes mellitus without complications: Secondary | ICD-10-CM | POA: Diagnosis not present

## 2022-04-26 NOTE — Patient Outreach (Signed)
  Care Coordination   Initial Visit Note   04/26/2022 Name: MICHELYN SCULLIN MRN: [id number] DOB: 1946/02/19  CATRIONA DILLENBECK is a 76 y.o. year old female who sees Ardelia Mems, Delorse Limber, MD for primary care. I spoke with  Lionel December by phone today.  What matters to the patients health and wellness today?  No additional services needed at this time.    Goals Addressed               This Visit's Progress     Care Coordination Activities (pt-stated)        Care Coordination Interventions: Reviewed scheduled/upcoming provider appointments including . Discussed plans with patient for ongoing care management follow up and provided patient with direct contact information for care management team Screening for signs and symptoms of depression related to chronic disease state   Care Coordination Interventions: Assessed social determinant of health barriers  Patient advised no additional resources needed at this time.        SDOH assessments and interventions completed:  Yes     Care Coordination Interventions Activated:  Yes  Care Coordination Interventions:  Yes, provided   Follow up plan: No further intervention required.   Encounter Outcome:  Pt. Visit Completed   Lenor Derrick, MSW  Social Worker [facility]  [phone]

## 2022-04-26 NOTE — Patient Instructions (Signed)
Visit Information  Thank you for taking time to visit with me today. Please don't hesitate to contact me if I can be of assistance to you.   Following are the goals we discussed today:   Goals Addressed               This Visit's Progress     Care Coordination Activities (pt-stated)        Care Coordination Interventions: Reviewed scheduled/upcoming provider appointments including . Discussed plans with patient for ongoing care management follow up and provided patient with direct contact information for care management team Screening for signs and symptoms of depression related to chronic disease state   Care Coordination Interventions: Assessed social determinant of health barriers  Patient advised no additional resources needed at this time.          Please call the care guide team at [phone] if you need to cancel or reschedule your appointment.   If you are experiencing a Mental Health or [place] or need someone to talk to, please call the Suicide and Crisis Lifeline: 988   Patient verbalizes understanding of instructions and care plan provided today and agrees to view in [place]. Active MyChart status and patient understanding of how to access instructions and care plan via MyChart confirmed with patient.     No further follow up required: . Lenor Derrick , MSW Social Worker [facility]  [phone]

## 2022-04-28 ENCOUNTER — Other Ambulatory Visit: Payer: Self-pay

## 2022-04-28 DIAGNOSIS — I4819 Other persistent atrial fibrillation: Secondary | ICD-10-CM

## 2022-04-28 MED ORDER — RIVAROXABAN 20 MG PO TABS: 20.0000 mg | ORAL_TABLET | Freq: Every day | ORAL | 0 refills | Status: DC

## 2022-05-10 ENCOUNTER — Ambulatory Visit (INDEPENDENT_AMBULATORY_CARE_PROVIDER_SITE_OTHER): Payer: Medicare Other | Admitting: Family Medicine

## 2022-05-10 ENCOUNTER — Encounter: Payer: Self-pay | Admitting: Family Medicine

## 2022-05-10 VITALS — BP 120/74 | HR 81 | Ht 62.0 in | Wt 144.2 lb

## 2022-05-10 DIAGNOSIS — E119 Type 2 diabetes mellitus without complications: Secondary | ICD-10-CM

## 2022-05-10 DIAGNOSIS — Z794 Long term (current) use of insulin: Secondary | ICD-10-CM

## 2022-05-10 DIAGNOSIS — R053 Chronic cough: Secondary | ICD-10-CM | POA: Diagnosis not present

## 2022-05-10 DIAGNOSIS — Z23 Encounter for immunization: Secondary | ICD-10-CM

## 2022-05-10 MED ORDER — CETIRIZINE HCL 10 MG PO TABS: 10.0000 mg | ORAL_TABLET | Freq: Every day | ORAL | 1 refills | Status: DC

## 2022-05-10 MED ORDER — GUAIFENESIN 100 MG/5ML PO SYRP: 200.0000 mg | ORAL_SOLUTION | Freq: Four times a day (QID) | ORAL | 0 refills | Status: DC | PRN

## 2022-05-10 NOTE — Progress Notes (Signed)
    SUBJECTIVE:   CHIEF COMPLAINT / HPI:   Cough - Present for about 6 weeks - Mucous cough at night  - Needing help with getting sleep  - No fevers, no recent sick contacts  Diabetes - CBGs ranging from 60s-200s - Lows occurring at night - Not symptomatic, but is occurring at night so unclear if truly asymptomatic  - Currently takes her basal dose at night   PERTINENT  PMH / PSH: Reviewed  OBJECTIVE:   BP 120/74   Pulse 81   Ht '5\' 2"'$  (1.575 m)   Wt 144 lb 3.2 oz (65.4 kg)   SpO2 97%   BMI 26.37 kg/m   Gen: well-appearing, NAD CV: RRR, no m/r/g appreciated, no peripheral edema Pulm: CTAB, no wheezes/crackles  ASSESSMENT/PLAN:   DM (diabetes mellitus), type 2 (HCC) CBGs with wide range from 60s-200s. Hypoglycemia occurring at night, patient woken up due to her CGM monitor. Of note, last assessment incorrectly had 30 units of Lantus and patient is on 38 units.  - Switch Lantus 38 Units to take in the morning - Monitor for further hypoglycemia - If hypoglycemic overnight, decrease dinner mealtime insulin to 10 Units.  - Call if hypoglycemic episodes - Follow-up in no later than 6 weeks.    Persistent cough with mucous Present for 6 weeks, possibly worsened with the allergy season starting with recent colder temperatures. Reassuring physical exam and history but will closely monitor and treat conservatively for now.  - CXR if no improvement in 2-3 weeks, patient to call the office to let me know - Robitussin and Zyrtec prescriptions sent, patient can also get OTC - Return sooner if having systemic symptoms.    Rise Patience, [place]

## 2022-05-10 NOTE — Assessment & Plan Note (Signed)
CBGs with wide range from 60s-200s. Hypoglycemia occurring at night, patient woken up due to her CGM monitor. Of note, last assessment incorrectly had 30 units of Lantus and patient is on 38 units.  - Switch Lantus 38 Units to take in the morning - Monitor for further hypoglycemia - If hypoglycemic overnight, decrease dinner mealtime insulin to 10 Units.  - Call if hypoglycemic episodes - Follow-up in no later than 6 weeks.

## 2022-05-10 NOTE — Patient Instructions (Signed)
It was so great seeing you today! Today we discussed the following:  Cough: I am prescribing you the Robitussin and Zyrtec to hopefully help with the cough.  I would recommend taking the Zyrtec at night as it can make some people sleepy.  If your insurance does not cover this, these medications are also available over-the-counter.  Call our office if you are still having this cough in the next 2 to 3 weeks and we will get a chest x-ray.  Diabetes: -Start taking your Lantus in the morning - If you are still having low sugars at night, then decrease the mealtime dose with your dinner to 10 units instead of 12 - Lets follow-up in mid- November to check your A1c and follow-up on your sugars. - If you have continuous low sugar episodes or feeling worse then call the office and come to see me sooner  Please make sure to bring any medications you take to your appointments. If you have any questions or concerns please call the office at [phone].

## 2022-05-18 ENCOUNTER — Ambulatory Visit (INDEPENDENT_AMBULATORY_CARE_PROVIDER_SITE_OTHER): Payer: Medicare Other

## 2022-05-18 DIAGNOSIS — I442 Atrioventricular block, complete: Secondary | ICD-10-CM

## 2022-05-19 ENCOUNTER — Other Ambulatory Visit: Payer: Self-pay | Admitting: Family Medicine

## 2022-05-19 DIAGNOSIS — R1013 Epigastric pain: Secondary | ICD-10-CM

## 2022-05-20 LAB — CUP PACEART REMOTE DEVICE CHECK
Battery Impedance: 356 Ohm
Battery Remaining Longevity: 109 mo
Battery Voltage: 2.77 V
Brady Statistic RV Percent Paced: 100 %
Date Time Interrogation Session: 20231013073220
Implantable Lead Implant Date: 19990203
Implantable Lead Implant Date: 19990203
Implantable Lead Location: 753859
Implantable Lead Location: 753860
Implantable Lead Model: 5068
Implantable Lead Model: 5092
Implantable Pulse Generator Implant Date: 20180711
Lead Channel Impedance Value: 67 Ohm
Lead Channel Impedance Value: 692 Ohm
Lead Channel Pacing Threshold Amplitude: 0.75 V
Lead Channel Pacing Threshold Pulse Width: 0.4 ms
Lead Channel Setting Pacing Amplitude: 2.5 V
Lead Channel Setting Pacing Pulse Width: 0.4 ms
Lead Channel Setting Sensing Sensitivity: 4 mV

## 2022-05-21 ENCOUNTER — Other Ambulatory Visit: Payer: Self-pay | Admitting: Family Medicine

## 2022-05-26 DIAGNOSIS — E119 Type 2 diabetes mellitus without complications: Secondary | ICD-10-CM | POA: Diagnosis not present

## 2022-05-31 ENCOUNTER — Encounter: Payer: Self-pay | Admitting: Student

## 2022-05-31 ENCOUNTER — Ambulatory Visit: Payer: Medicare Other | Attending: Student | Admitting: Student

## 2022-05-31 VITALS — BP 100/66 | HR 86 | Ht 62.0 in | Wt 142.6 lb

## 2022-05-31 DIAGNOSIS — I5032 Chronic diastolic (congestive) heart failure: Secondary | ICD-10-CM

## 2022-05-31 DIAGNOSIS — I4819 Other persistent atrial fibrillation: Secondary | ICD-10-CM

## 2022-05-31 DIAGNOSIS — I442 Atrioventricular block, complete: Secondary | ICD-10-CM

## 2022-05-31 LAB — CUP PACEART INCLINIC DEVICE CHECK
Battery Impedance: 381 Ohm
Battery Remaining Longevity: 105 mo
Battery Voltage: 2.78 V
Brady Statistic RV Percent Paced: 99 %
Date Time Interrogation Session: 20231024092723
Implantable Lead Connection Status: 753985
Implantable Lead Connection Status: 753985
Implantable Lead Implant Date: 19990203
Implantable Lead Implant Date: 19990203
Implantable Lead Location: 753859
Implantable Lead Location: 753860
Implantable Lead Model: 5068
Implantable Lead Model: 5092
Implantable Pulse Generator Implant Date: 20180711
Lead Channel Impedance Value: 641 Ohm
Lead Channel Impedance Value: 67 Ohm
Lead Channel Pacing Threshold Amplitude: 0.75 V
Lead Channel Pacing Threshold Amplitude: 0.875 V
Lead Channel Pacing Threshold Pulse Width: 0.4 ms
Lead Channel Pacing Threshold Pulse Width: 0.4 ms
Lead Channel Setting Pacing Amplitude: 2.5 V
Lead Channel Setting Pacing Pulse Width: 0.4 ms
Lead Channel Setting Sensing Sensitivity: 4 mV
Zone Setting Status: 755011
Zone Setting Status: 755011

## 2022-05-31 NOTE — Progress Notes (Signed)
Electrophysiology Office Note Date: 05/31/2022  ID:  Mercedes Barr, DOB 04/10/1946, MRN [id number]  PCP: Rise Patience, DO Primary Cardiologist: Sanda Klein, MD Electrophysiologist: Sanda Klein, MD   CC: Pacemaker follow-up  Mercedes Barr is a 76 y.o. female seen today for Sanda Klein, MD for routine electrophysiology followup. Since last being seen in our clinic the patient reports doing very well.  she denies chest pain, palpitations, dyspnea, PND, orthopnea, nausea, vomiting, dizziness, syncope, edema, weight gain, or early satiety.   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 [place].  OFFICE NOTE DR. A. LITTLE STATES  "EXTREMELY PACEMAKER SENSITIVE AND WHEN YOU TRY TO CHECK FOR UNDERLYING RHYTHMS SHE WILL HAVE SNYCOPE AND WE HAVE NOT DONE THIS NOW IN ABOUT 2 YEARS".   Shortness of breath dyspnea    walking distance or climbing stairs   Past Surgical History:  Procedure Laterality Date   ATRIOVENTRICULAR CUSHION DEFECT REPAIR  1988   CARDIAC CATHETERIZATION  2011    SHOWED NO CAD-PER CARDIOLOGY OFFICE NOTES DR. A. LITTLE   colonscopy      removed polyps    INSERT / REPLACE / REMOVE PACEMAKER     PACEMAKER INSERTION  1988   last gen 11/08- MDT   PPM GENERATOR CHANGEOUT N/A 02/15/2017   Procedure: PPM Generator Changeout;  Surgeon: Sanda Klein, MD;  Location: [facility];  Service: Cardiovascular;  Laterality: N/A;   TOTAL KNEE ARTHROPLASTY Right 11/19/2012   Procedure: RIGHT TOTAL KNEE  ARTHROPLASTY;  Surgeon: Gearlean Alf, MD;  Location: [facility];  Service: Orthopedics;  Laterality: Right;   TOTAL KNEE REVISION Right 09/24/2014   Procedure: RIGHT TOTAL KNEE ARTHROPLASTY REVISION;  Surgeon: Gearlean Alf, MD;  Location: [facility];  Service: Orthopedics;  Laterality: Right;   TUBAL LIGATION      Current Outpatient Medications  Medication Sig Dispense Refill   acetaminophen (TYLENOL) 325 MG tablet Take 650 mg by mouth every 6 (six) hours as needed for moderate pain (Right knee pain).     albuterol (VENTOLIN HFA) 108 (90 Base) MCG/ACT inhaler INHALE 2 PUFFS EVERY 4 HOURS AS NEEDED FOR WHEEZING 51 each 1   Blood Glucose Monitoring Suppl (ONE TOUCH ULTRA 2) w/Device KIT Use to check blood sugar as directed. 1 each 0   cetirizine (ZYRTEC ALLERGY) 10 MG tablet Take 1 tablet (10 mg total) by mouth daily. 30 tablet 1   citalopram (CELEXA) 20 MG tablet Take 1 tablet (20 mg total) by mouth every morning. 90 tablet 3   empagliflozin (JARDIANCE) 25 MG TABS tablet Take 1 tablet (25 mg total) by mouth daily. 90 tablet 3   EPIPEN 2-PAK 0.3 MG/0.3ML SOAJ injection Inject 0.3 mLs (0.3 mg  total) into the muscle as  needed for severe allergic  reaction, and contact  medical provider. 2 Device 0   famotidine (PEPCID) 20 MG tablet TAKE 1 TABLET BY MOUTH TWICE A DAY 180 tablet 1  FLOVENT HFA 110 MCG/ACT inhaler Inhale into the lungs.     furosemide (LASIX) 20 MG tablet TAKE 1 TABLET BY MOUTH IN THE  MORNING 100 tablet 2   glucose blood (ONE TOUCH ULTRA TEST) test strip Use for blood sugar testing 1-3 times daily 100 each 12   guaifenesin (ROBITUSSIN) 100 MG/5ML syrup Take 10 mLs (200 mg total) by mouth 4 (four) times daily as needed for cough. 473 mL 0   insulin glargine (LANTUS SOLOSTAR) 100 UNIT/ML Solostar Pen Inject 38 Units into the skin daily.     insulin lispro (HUMALOG KWIKPEN) 100 UNIT/ML KwikPen Inject 12 Units into the skin 2 (two) times daily before a meal. 15 mL 11   Insulin Pen Needle  (BD PEN NEEDLE NANO U/F) 32G X 4 MM MISC Use as directed with insulin four times daily 100 each 12   Insulin Pen Needle 31G X 4 MM MISC 1 each by Does not apply route daily. 100 each 12   Lancets MISC Use with blood sugar monitor to check daily 100 each 11   levothyroxine (SYNTHROID) 112 MCG tablet TAKE 1 TABLET (112 MCG TOTAL) BY MOUTH EVERY MORNING. 30 MINUTES BEFORE FOOD 90 tablet 3   losartan (COZAAR) 25 MG tablet TAKE 1 TABLET BY MOUTH DAILY 100 tablet 2   methocarbamol (ROBAXIN) 500 MG tablet Take 1 tablet (500 mg total) by mouth 2 (two) times daily as needed for muscle spasms. 20 tablet 0   Omega-3 Fatty Acids (FISH OIL) 1000 MG CAPS Take 1 capsule by mouth every morning. Reported on 02/11/2016     omeprazole (PRILOSEC) 20 MG capsule TAKE 1 CAPSULE BY MOUTH  TWICE DAILY 180 capsule 3   polyethylene glycol powder (GLYCOLAX/MIRALAX) powder TAKE 17 GRAMS BY MOUTH 2  TIMES DAILY AS NEEDED FOR  LAXATIVE 1054 g 0   potassium chloride SA (KLOR-CON M) 20 MEQ tablet Take 1 tablet (20 mEq total) by mouth daily. 90 tablet 3   PRODIGY NO CODING BLOOD GLUC test strip USE THREE TIMES A DAY TO CHECK ON BLOOD GLUCOSE LEVEL 100 strip 12   propranolol (INDERAL) 40 MG tablet Take 1 tablet (40 mg total) by mouth daily. 90 tablet 3   rivaroxaban (XARELTO) 20 MG TABS tablet Take 1 tablet (20 mg total) by mouth daily with supper. 90 tablet 0   rosuvastatin (CRESTOR) 20 MG tablet TAKE 1 TABLET BY MOUTH EVERY DAY 90 tablet 4   Spacer/Aero-Holding Chambers (AEROCHAMBER PLUS) inhaler Use as instructed 1 each 2   Travoprost, BAK Free, (TRAVATAN) 0.004 % SOLN ophthalmic solution Place 1 drop into both eyes at bedtime. 1 Bottle 5   fluticasone (FLONASE) 50 MCG/ACT nasal spray Place 2 sprays into both nostrils daily for 14 days. 16 g 0   No current facility-administered medications for this visit.    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    Patient enjoys walking around her neighborhood with her 2 neighbors.    Patient does not drive, however she takes SCAT to all her medical apts.    Social Determinants of Health   Financial Resource Strain: Low Risk  (02/11/2019)   Overall Financial Resource Strain (CARDIA)    Difficulty of Paying Living Expenses: Not hard at all  Food Insecurity: No Food Insecurity (04/26/2022)   Hunger Vital Sign    Worried About Running Out of Food in the Last Year: Never true    Ran Out of Food in the Last Year: Never true  Transportation Needs: No Transportation Needs (04/26/2022)   PRAPARE - Hydrologist (Medical): No    Lack of Transportation (Non-Medical): No  Physical Activity: Insufficiently Active (02/11/2019)   Exercise Vital Sign    Days of Exercise per Week: 2 days    Minutes of Exercise per Session: 40 min  Stress: Stress Concern Present (02/11/2019)   [place]    Feeling of Stress : To some extent  Social Connections: Moderately Isolated (02/11/2019)   Social Connection and Isolation Panel [NHANES]    Frequency of Communication with Friends and Family: More than three times a week    Frequency of Social Gatherings with Friends and Family:  Twice a week    Attends Religious Services: More than 4 times per year    Active Member of [employer] or Organizations: No    Attends Archivist Meetings: Never    Marital Status: Divorced  Human resources officer Violence: Not At Risk (02/11/2019)   Humiliation, Afraid, Rape, and Kick questionnaire    Fear of Current or Ex-Partner: No    Emotionally Abused: No    Physically Abused: No    Sexually Abused: No    Family History: Family History  Problem Relation Age of Onset   Heart failure Mother    Hypertension Mother    Stroke Father    Heart failure Sister    Kidney disease Daughter    Cancer Brother    Breast cancer Neg Hx      Review of Systems: All other systems reviewed and are otherwise negative except as noted above.  Physical Exam: Vitals:   05/31/22 0913  BP: 100/66  Pulse: 86  SpO2: 99%  Weight: 142 lb 9.6 oz (64.7 kg)  Height: 5' 2"  (1.575 m)     GEN- The patient is well appearing, alert and oriented x 3 today.   HEENT: normocephalic, atraumatic; sclera clear, conjunctiva pink; hearing intact; oropharynx clear; neck supple, no JVP Lymph- no cervical lymphadenopathy Lungs- Clear to ausculation bilaterally, normal work of breathing.  No wheezes, rales, rhonchi Heart- Regular  rate and rhythm, no murmurs, rubs or gallops, PMI not laterally displaced GI- soft, non-tender, non-distended, bowel sounds present, no hepatosplenomegaly Extremities- no clubbing or cyanosis. No peripheral edema; DP/PT/radial pulses 2+ bilaterally MS- no significant deformity or atrophy Skin- warm and dry, no rash or lesion; PPM pocket well healed Psych- euthymic mood, full affect Neuro- strength and sensation are intact  PPM Interrogation-  reviewed in detail today,  See PACEART report.  EKG:  EKG is ordered today. Personal review of ekg ordered today shows V pacing at 86 bpm, AF underlying.   Recent Labs: 08/18/2021: BUN  12; Creatinine, Ser 0.72; Potassium 3.9; Sodium 144   Wt  Readings from Last 3 Encounters:  05/31/22 142 lb 9.6 oz (64.7 kg)  05/10/22 144 lb 3.2 oz (65.4 kg)  03/23/22 141 lb (64 kg)     Other studies Reviewed: Additional studies/ records that were reviewed today include: Previous EP office notes, Previous remote checks, Most recent labwork.   Assessment and Plan:  1. CHB s/p Medtronic PPM  Normal PPM function See Pace Art report No changes today  2. Permanent AF Continue Xarelto for CHA2DS2VASc  of at least 5  3. Chronic diastolic CHF Echo 0/4492 LVEF 55-60%, Mod LAE  Current medicines are reviewed at length with the patient today.    Labs/ tests ordered today include:  Orders Placed This Encounter  Procedures   Basic metabolic panel   CBC   CUP PACEART INCLINIC DEVICE CHECK   EKG 12-Lead     Disposition:   Follow up with Dr. Sallyanne Kuster in 12 months    Signed, Shirley Friar, PA-C  05/31/2022 9:28 AM  [facility] [address] [place] [address] [place] [address] [phone] (office) [phone] (fax)

## 2022-05-31 NOTE — Patient Instructions (Signed)
Medication Instructions:  Your physician recommends that you continue on your current medications as directed. Please refer to the Current Medication list given to you today.  *If you need a refill on your cardiac medications before your next appointment, please call your pharmacy*   Lab Work: TODAY: BMET, CBC  If you have labs (blood work) drawn today and your tests are completely normal, you will receive your results only by: [place] (if you have MyChart) OR A paper copy in the mail If you have any lab test that is abnormal or we need to change your treatment, we will call you to review the results.   Follow-Up: At [facility], you and your health needs are our priority.  As part of our continuing mission to provide you with exceptional heart care, we have created designated Provider Care Teams.  These Care Teams include your primary Cardiologist (physician) and Advanced Practice Providers (APPs -  Physician Assistants and Nurse Practitioners) who all work together to provide you with the care you need, when you need it.  We recommend signing up for the patient portal called "MyChart".  Sign up information is provided on this After Visit Summary.  MyChart is used to connect with patients for Virtual Visits (Telemedicine).  Patients are able to view lab/test results, encounter notes, upcoming appointments, etc.  Non-urgent messages can be sent to your provider as well.   To learn more about what you can do with MyChart, go to [email or web address].    Your next appointment:   1 year(s)  The format for your next appointment:   In Person  Provider:   Sanda Klein, MD    Important Information About Sugar

## 2022-05-31 NOTE — Progress Notes (Signed)
Remote pacemaker transmission.   

## 2022-06-01 ENCOUNTER — Other Ambulatory Visit: Payer: Self-pay

## 2022-06-01 DIAGNOSIS — I10 Essential (primary) hypertension: Secondary | ICD-10-CM

## 2022-06-01 LAB — CBC
Hematocrit: 44.6 % (ref 34.0–46.6)
Hemoglobin: 14.2 g/dL (ref 11.1–15.9)
MCH: 26.9 pg (ref 26.6–33.0)
MCHC: 31.8 g/dL (ref 31.5–35.7)
MCV: 85 fL (ref 79–97)
Platelets: 230 10*3/uL (ref 150–450)
RBC: 5.27 x10E6/uL (ref 3.77–5.28)
RDW: 12.8 % (ref 11.7–15.4)
WBC: 5.1 10*3/uL (ref 3.4–10.8)

## 2022-06-01 LAB — BASIC METABOLIC PANEL
BUN/Creatinine Ratio: 18 (ref 12–28)
BUN: 13 mg/dL (ref 8–27)
CO2: 22 mmol/L (ref 20–29)
Calcium: 9 mg/dL (ref 8.7–10.3)
Chloride: 108 mmol/L — ABNORMAL HIGH (ref 96–106)
Creatinine, Ser: 0.74 mg/dL (ref 0.57–1.00)
Glucose: 228 mg/dL — ABNORMAL HIGH (ref 70–99)
Potassium: 3.3 mmol/L — ABNORMAL LOW (ref 3.5–5.2)
Sodium: 145 mmol/L — ABNORMAL HIGH (ref 134–144)
eGFR: 84 mL/min/{1.73_m2} (ref >59)

## 2022-06-01 MED ORDER — POTASSIUM CHLORIDE CRYS ER 20 MEQ PO TBCR: 40.0000 meq | EXTENDED_RELEASE_TABLET | Freq: Every day | ORAL | 3 refills | Status: DC

## 2022-06-07 ENCOUNTER — Ambulatory Visit: Payer: Medicare Other | Attending: Student

## 2022-06-07 DIAGNOSIS — I10 Essential (primary) hypertension: Secondary | ICD-10-CM

## 2022-06-07 LAB — BASIC METABOLIC PANEL
BUN/Creatinine Ratio: 13 (ref 12–28)
BUN: 10 mg/dL (ref 8–27)
CO2: 23 mmol/L (ref 20–29)
Calcium: 9 mg/dL (ref 8.7–10.3)
Chloride: 105 mmol/L (ref 96–106)
Creatinine, Ser: 0.78 mg/dL (ref 0.57–1.00)
Glucose: 171 mg/dL — ABNORMAL HIGH (ref 70–99)
Potassium: 4.1 mmol/L (ref 3.5–5.2)
Sodium: 142 mmol/L (ref 134–144)
eGFR: 79 mL/min/{1.73_m2} (ref >59)

## 2022-06-09 ENCOUNTER — Other Ambulatory Visit: Payer: Self-pay | Admitting: Family Medicine

## 2022-06-14 DIAGNOSIS — H401132 Primary open-angle glaucoma, bilateral, moderate stage: Secondary | ICD-10-CM | POA: Diagnosis not present

## 2022-06-26 DIAGNOSIS — E119 Type 2 diabetes mellitus without complications: Secondary | ICD-10-CM | POA: Diagnosis not present

## 2022-07-06 ENCOUNTER — Encounter: Payer: Self-pay | Admitting: Podiatry

## 2022-07-06 ENCOUNTER — Ambulatory Visit (INDEPENDENT_AMBULATORY_CARE_PROVIDER_SITE_OTHER): Payer: Medicare Other | Admitting: Podiatry

## 2022-07-06 ENCOUNTER — Encounter: Payer: Self-pay | Admitting: Family Medicine

## 2022-07-06 ENCOUNTER — Ambulatory Visit (INDEPENDENT_AMBULATORY_CARE_PROVIDER_SITE_OTHER): Payer: Medicare Other | Admitting: Family Medicine

## 2022-07-06 VITALS — BP 124/57 | HR 80 | Ht 62.0 in | Wt 145.2 lb

## 2022-07-06 DIAGNOSIS — Z794 Long term (current) use of insulin: Secondary | ICD-10-CM

## 2022-07-06 DIAGNOSIS — J00 Acute nasopharyngitis [common cold]: Secondary | ICD-10-CM | POA: Diagnosis not present

## 2022-07-06 DIAGNOSIS — M19079 Primary osteoarthritis, unspecified ankle and foot: Secondary | ICD-10-CM | POA: Diagnosis not present

## 2022-07-06 DIAGNOSIS — K1120 Sialoadenitis, unspecified: Secondary | ICD-10-CM

## 2022-07-06 DIAGNOSIS — E119 Type 2 diabetes mellitus without complications: Secondary | ICD-10-CM

## 2022-07-06 DIAGNOSIS — M79675 Pain in left toe(s): Secondary | ICD-10-CM

## 2022-07-06 DIAGNOSIS — B351 Tinea unguium: Secondary | ICD-10-CM

## 2022-07-06 DIAGNOSIS — M79674 Pain in right toe(s): Secondary | ICD-10-CM

## 2022-07-06 NOTE — Progress Notes (Signed)
This patient returns to my office for at risk foot care.  This patient requires this care by a professional since this patient will be at risk due to having type 2 diabetes.  This patient is unable to cut nails herself since the patient cannot reach her nails.These nails are painful walking and wearing shoes.  This patient presents for at risk foot care today.  General Appearance  Alert, conversant and in no acute stress.  Vascular  Dorsalis pedis and posterior tibial  pulses are palpable  bilaterally.  Capillary return is within normal limits  bilaterally. Temperature is within normal limits  bilaterally.  Neurologic  Senn-Weinstein monofilament wire test within normal limits  bilaterally. Muscle power within normal limits bilaterally.  Nails Thick disfigured discolored nails with subungual debris  from hallux to fifth toes bilaterally. No evidence of bacterial infection or drainage bilaterally.  Orthopedic  No limitations of motion  feet .  No crepitus or effusions noted.  No bony pathology or digital deformities noted.  Midfoot arthritis.  Skin  normotropic skin with no porokeratosis noted bilaterally.  No signs of infections or ulcers noted.     Onychomycosis  Pain in right toes  Pain in left toes  Consent was obtained for treatment procedures.   Mechanical debridement of nails 1-5  bilaterally performed with a nail nipper.  Filed with dremel without incident.  Told to use voltaren ointment at night.   Return office visit                     Told patient to return for periodic foot care and evaluation due to potential at risk complications.   Gardiner Barefoot DPM

## 2022-07-06 NOTE — Assessment & Plan Note (Signed)
Patient's last A1c 8.7, decreased from 8.9.  Patient expressed interest today and working on diet to further bring her A1c within goal range 7-8.  Discussed scheduling a follow-up rest of the holidays to discuss the plan for this.

## 2022-07-06 NOTE — Progress Notes (Signed)
    SUBJECTIVE:   CHIEF COMPLAINT / HPI: sore throat  Having pain right side of jaw. Has had associated swelling, that has now decreased. Started on Sunday. Lots of mucous in throat. No fevers. Has also had a cough. Some yellow mucous with cough. Mucous in nose has been going on for few months. Has been taking cough syrup. Has seasonal allergies. Uses Flonase. No shortness of breath. No chest pain. No sick contacts. Takes Tylenol.   PERTINENT  PMH / PSH: HTN, HFpEF, Heart Block, COPD, DM2, Hypothyroidism  OBJECTIVE:   BP (!) 124/57   Pulse 80   Ht '5\' 2"'$  (1.575 m)   Wt 145 lb 3.2 oz (65.9 kg)   SpO2 100%   BMI 26.56 kg/m   General: NAD  HEENT: Pupils equal round and reactive, moist mucous membranes, no posterior oropharyngeal erythema, no signs of tonsillar adenitis or exudates, external auditory canals normal, right TMs visible, no erythema, right clear, palpable submandibular gland on right side, mild tenderness to palpation, no visible erythema or swelling along mandible internally or externally Respiratory: normal WOB on RA Extremities: Moving all 4 extremities equally   ASSESSMENT/PLAN:   DM (diabetes mellitus), type 2 (HCC) Patient's last A1c 8.7, decreased from 8.9.  Patient expressed interest today and working on diet to further bring her A1c within goal range 7-8.  Discussed scheduling a follow-up rest of the holidays to discuss the plan for this.   Viral URI History and exam consistent with viral URI, likely causing submandibular right-sided gland inflammation and pain.  No indication to suggest bacterial infection at this time.  Discussed with patient symptomatic care management with sialagogues such as cough drops or lemon drops, and Zyrtec for mucus, and allergy control.  Follow-up as needed,  Salvadore Oxford, MD [place]

## 2022-07-06 NOTE — Patient Instructions (Signed)
It was great to see you! Thank you for allowing me to participate in your care!  I recommend that you always bring your medications to each appointment as this makes it easy to ensure we are on the correct medications and helps [place] not miss when refills are needed.  Our plans for today:  -I believe you have a viral upper respiratory infection that is likely causing irritation of your submandibular salivary gland.  Continue symptomatic management and use lemon drops to help flush that out as appropriate.   Take care and seek immediate care sooner if you develop any concerns.   Dr. Salvadore Oxford, MD [place]

## 2022-07-07 ENCOUNTER — Other Ambulatory Visit: Payer: Self-pay | Admitting: Family Medicine

## 2022-07-26 DIAGNOSIS — E119 Type 2 diabetes mellitus without complications: Secondary | ICD-10-CM | POA: Diagnosis not present

## 2022-07-31 ENCOUNTER — Other Ambulatory Visit: Payer: Self-pay | Admitting: Family Medicine

## 2022-07-31 DIAGNOSIS — I4819 Other persistent atrial fibrillation: Secondary | ICD-10-CM

## 2022-08-10 ENCOUNTER — Encounter: Payer: Self-pay | Admitting: Family Medicine

## 2022-08-10 ENCOUNTER — Ambulatory Visit (INDEPENDENT_AMBULATORY_CARE_PROVIDER_SITE_OTHER): Payer: Medicare Other | Admitting: Family Medicine

## 2022-08-10 ENCOUNTER — Ambulatory Visit
Admission: RE | Admit: 2022-08-10 | Discharge: 2022-08-10 | Disposition: A | Discharge status: Home / Self Care | Payer: Medicare Other | Source: Ambulatory Visit | Attending: Family Medicine | Admitting: Family Medicine

## 2022-08-10 VITALS — BP 138/78 | HR 65 | Ht 62.0 in | Wt 142.0 lb

## 2022-08-10 DIAGNOSIS — Z794 Long term (current) use of insulin: Secondary | ICD-10-CM | POA: Diagnosis not present

## 2022-08-10 DIAGNOSIS — J42 Unspecified chronic bronchitis: Secondary | ICD-10-CM | POA: Diagnosis not present

## 2022-08-10 DIAGNOSIS — R053 Chronic cough: Secondary | ICD-10-CM

## 2022-08-10 DIAGNOSIS — E119 Type 2 diabetes mellitus without complications: Secondary | ICD-10-CM | POA: Diagnosis not present

## 2022-08-10 LAB — POCT GLYCOSYLATED HEMOGLOBIN (HGB A1C): HbA1c, POC (controlled diabetic range): 9.1 % — AB (ref 0.0–7.0)

## 2022-08-10 NOTE — Patient Instructions (Signed)
Your A1c did go up a little bit since we last checked it.  I want you to keep a food diary of what you are eating and drinking for the next 1 to 2 weeks and then follow-up with me.  We will check 3-year glucose monitor log at that time and see what is happening with your sugars.  Keep an eye on if you are having any low sugar/hypoglycemic episodes.  I am getting a chest x-ray ordered because her cough is continued and it sounds worse.  Pending the results of that, I will also want you to follow-up with our pharmacy team in the clinic (Dr. Valentina Lucks) because I wonder if you need repeat testing to check and see how your lungs are doing.  I want to see you back in 1 week to check on that as well.  1 thing we will need to discuss is if you want to try and switch to a medication like Ozempic or Trulicity, these are the once a week injections for diabetes.

## 2022-08-10 NOTE — Progress Notes (Signed)
    SUBJECTIVE:   CHIEF COMPLAINT / HPI:   T2DM - Checking BG at home: Has CGM, CBGs have been from the 60s-300s - Medications: Lantus 39Units daily, Lispro 12 Units with breakfast and lunch, Jardiance '25mg'$   - Compliance: good - foot exam:  03/23/2022 - microalbumin:   patient unable to urinate today - had hypoglycemic event last night  Cough - Still persistent since Oct 2023 - Got worse after Thanksgiving - Has not had any fevers - Is a never smoker diagnosed with mild obstructive airway disease in 2007 via PFTs - Is taking OTC cough medication - Flovent is not being taken because it hurts her chest - Uses a humidifier with some improvement at home  PERTINENT  PMH / PSH: Reviewed  OBJECTIVE:   BP 138/78   Pulse 65   Ht '5\' 2"'$  (1.575 m)   Wt 142 lb (64.4 kg)   SpO2 96%   BMI 25.97 kg/m   Gen: well-appearing, NAD CV: RRR, no m/r/g appreciated, no peripheral edema Pulm: dry cough in the room, rhonchi in LLL, breathing comfortably on room air  ASSESSMENT/PLAN:   COPD (chronic obstructive pulmonary disease) Not taking Flovent but is using albuterol. Has chronic cough now getting worse, mild improvement with albuterol use. Given the new lung findings on exam, unsure if patient may have pneumonia and will order CXR. Do wonder if patient would benefit from meeting with pharmacy team in clinic to get repeat PFTs and discuss other inhaler alternatives to improve compliance - CXR today - Recommend PFTs and inhaler discussion with pharmacy team - follow-up in 1 week  DM (diabetes mellitus), type 2 ([place]) A1c increasing, has had increased starches in her diet lately and has not been feeling well with this cough. Has hypoglycemic episodes in the night occasionally so I am hesitant to adjust her medications at this time though she has high levels of fluctuation during the day. Feel that transitioning to a GLP-1 and other medications would be in patient's best interest if we are able.   - Keep food diary and CGM diary for the next week - 1 week follow-up - Consider Trulicity at next visit if able - No changes to medications today - Patient unable to void for microalbumin, get at next visit     Rise Patience, [place]

## 2022-08-10 NOTE — Assessment & Plan Note (Signed)
Not taking Flovent but is using albuterol. Has chronic cough now getting worse, mild improvement with albuterol use. Given the new lung findings on exam, unsure if patient may have pneumonia and will order CXR. Do wonder if patient would benefit from meeting with pharmacy team in clinic to get repeat PFTs and discuss other inhaler alternatives to improve compliance - CXR today - Recommend PFTs and inhaler discussion with pharmacy team - follow-up in 1 week

## 2022-08-10 NOTE — Assessment & Plan Note (Signed)
A1c increasing, has had increased starches in her diet lately and has not been feeling well with this cough. Has hypoglycemic episodes in the night occasionally so I am hesitant to adjust her medications at this time though she has high levels of fluctuation during the day. Feel that transitioning to a GLP-1 and other medications would be in patient's best interest if we are able.  - Keep food diary and CGM diary for the next week - 1 week follow-up - Consider Trulicity at next visit if able - No changes to medications today - Patient unable to void for microalbumin, get at next visit

## 2022-08-13 ENCOUNTER — Other Ambulatory Visit: Payer: Self-pay | Admitting: Family Medicine

## 2022-08-17 ENCOUNTER — Encounter: Payer: Self-pay | Admitting: Family Medicine

## 2022-08-17 ENCOUNTER — Ambulatory Visit (INDEPENDENT_AMBULATORY_CARE_PROVIDER_SITE_OTHER): Payer: Medicare Other | Admitting: Family Medicine

## 2022-08-17 ENCOUNTER — Ambulatory Visit (INDEPENDENT_AMBULATORY_CARE_PROVIDER_SITE_OTHER): Payer: 59

## 2022-08-17 VITALS — BP 134/64 | HR 78 | Wt 143.0 lb

## 2022-08-17 DIAGNOSIS — I442 Atrioventricular block, complete: Secondary | ICD-10-CM | POA: Diagnosis not present

## 2022-08-17 DIAGNOSIS — J42 Unspecified chronic bronchitis: Secondary | ICD-10-CM

## 2022-08-17 DIAGNOSIS — Z23 Encounter for immunization: Secondary | ICD-10-CM | POA: Diagnosis not present

## 2022-08-17 DIAGNOSIS — Z794 Long term (current) use of insulin: Secondary | ICD-10-CM | POA: Diagnosis not present

## 2022-08-17 DIAGNOSIS — E119 Type 2 diabetes mellitus without complications: Secondary | ICD-10-CM | POA: Diagnosis not present

## 2022-08-17 MED ORDER — BUDESONIDE-FORMOTEROL FUMARATE 80-4.5 MCG/ACT IN AERO: 2.0000 | INHALATION_SPRAY | Freq: Two times a day (BID) | RESPIRATORY_TRACT | 3 refills | Status: DC

## 2022-08-17 MED ORDER — GUAIFENESIN 100 MG/5ML PO SYRP: ORAL_SOLUTION | ORAL | 1 refills | Status: DC

## 2022-08-17 MED ORDER — TRULICITY 0.75 MG/0.5ML ~~LOC~~ SOAJ: 0.7500 mg | SUBCUTANEOUS | 0 refills | Status: DC

## 2022-08-17 NOTE — Progress Notes (Signed)
    SUBJECTIVE:   CHIEF COMPLAINT / HPI:   T2DM - CBGs: last hypoglycemic episode was January 3rd, otherwise sugars have averaged between 170-300s  - Food diary: overall does well with her food balance and doesn't overload with carbs or sugary foods  Cough - CXR was normal at last visit - Wishes to try a different inhaler  PERTINENT  PMH / PSH: Reviewed  OBJECTIVE:   BP 134/64   Pulse 78   Wt 143 lb (64.9 kg)   SpO2 96%   BMI 26.16 kg/m   General: NAD, well-appearing, well-nourished Respiratory: No respiratory distress, breathing comfortably, able to speak in full sentences Skin: warm and dry, no rashes noted on exposed skin Psych: Appropriate affect and mood  ASSESSMENT/PLAN:   DM (diabetes mellitus), type 2 ([place]) Food diary is well-balanced with no significant concerns. CGM log reviewed and significant hyperglycemia from 170s-300s. Last hypoglycemic episode was January 3rd. - Trulicity 0.'75mg'$  weekly prescribed (demonstrated administration in clinic) - Once on Trulicity, decrease Lantus by 4 units if fasting CBG <150 - If low sugars after meals, discontinue short acting insulin - Strict return precautions given and to call the office if hypoglycemic - Microalbumin today  COPD (chronic obstructive pulmonary disease) Chronic cough without exacerbation at this time. Would prefer to switch from Flovent due side effects. - Trial of Symbicort - Recommended following up with pharm team for PFTs and inhaler monitoring   COVID vaccine administered today per patient preference  Mercedes Barr, [place]

## 2022-08-17 NOTE — Patient Instructions (Addendum)
We are going to try and get you approved for the medication Trulicity, which is a once weekly injection.  When you are able to get this medication we will need to keep a close eye on your sugars and we will start dropping your insulin while we do that.  When you start the injections, if you have any questions or want to do it here at the office to talk through it we can do that as well.  You will need to keep a close eye on your sugars so we do not have any hypoglycemic episodes while you are doing this.  I would rather you be a little bit high then go low again. - If fasting CBG <150, decrease long-acting insulin (Lantus) by 4 units - Continue to do this if sugars remain low - If your sugars go low after meals while taking your short acting insulin then stop the short acting and call our office   For your cough, I want you to make an appointment with Dr. Valentina Lucks to see if you need repeat PFTs.  I will also, send in a different inhaler to see if that helps with the pain you are having.  Try to take this inhaler daily, if no improvement or if you are still having chest discomfort with the inhaler let me know

## 2022-08-17 NOTE — Assessment & Plan Note (Signed)
Chronic cough without exacerbation at this time. Would prefer to switch from Flovent due side effects. - Trial of Symbicort - Recommended following up with pharm team for PFTs and inhaler monitoring

## 2022-08-17 NOTE — Assessment & Plan Note (Signed)
Food diary is well-balanced with no significant concerns. CGM log reviewed and significant hyperglycemia from 170s-300s. Last hypoglycemic episode was January 3rd. - Trulicity 0.'75mg'$  weekly prescribed (demonstrated administration in clinic) - Once on Trulicity, decrease Lantus by 4 units if fasting CBG <150 - If low sugars after meals, discontinue short acting insulin - Strict return precautions given and to call the office if hypoglycemic - Microalbumin today

## 2022-08-18 ENCOUNTER — Telehealth: Payer: Self-pay

## 2022-08-18 LAB — MICROALBUMIN / CREATININE URINE RATIO
Creatinine, Urine: 120.6 mg/dL
Microalb/Creat Ratio: 14 mg/g creat (ref 0–29)
Microalbumin, Urine: 16.7 ug/mL

## 2022-08-18 NOTE — Telephone Encounter (Signed)
Patient calls nurse line asking if she is still supposed to take other insulin when she starts Trulicity.   Provided patient with instructions per Dr. Marica Otter office note.   All questions answered.   Talbot Grumbling, RN

## 2022-08-18 NOTE — Telephone Encounter (Signed)
Scheduled remote reviewed.  Incomplete report - route to triage Next remote 91 days. [place]  Noted, I have contacted [employer] and placed an IT ticket to investigate why the reports are incomplete. I am being told that they are investigating as [employer] are having some issues communicating with [employer].  They will contact us back with an update. Will continue monitoring. MW, Therapist, sports.

## 2022-08-20 ENCOUNTER — Other Ambulatory Visit: Payer: Self-pay | Admitting: Family Medicine

## 2022-08-22 ENCOUNTER — Other Ambulatory Visit: Payer: Self-pay | Admitting: Family Medicine

## 2022-08-22 LAB — CUP PACEART REMOTE DEVICE CHECK
Battery Impedance: 405 Ohm
Battery Remaining Longevity: 104 mo
Battery Voltage: 2.78 V
Brady Statistic RV Percent Paced: 95 %
Date Time Interrogation Session: 20240111115530
Implantable Lead Connection Status: 753985
Implantable Lead Connection Status: 753985
Implantable Lead Implant Date: 19990203
Implantable Lead Implant Date: 19990203
Implantable Lead Location: 753859
Implantable Lead Location: 753860
Implantable Lead Model: 5068
Implantable Lead Model: 5092
Implantable Pulse Generator Implant Date: 20180711
Lead Channel Impedance Value: 67 Ohm
Lead Channel Impedance Value: 678 Ohm
Lead Channel Pacing Threshold Amplitude: 0.625 V
Lead Channel Pacing Threshold Pulse Width: 0.4 ms
Lead Channel Setting Pacing Amplitude: 2.5 V
Lead Channel Setting Pacing Pulse Width: 0.4 ms
Lead Channel Setting Sensing Sensitivity: 4 mV
Zone Setting Status: 755011
Zone Setting Status: 755011

## 2022-08-22 MED ORDER — EMPAGLIFLOZIN 25 MG PO TABS: 25.0000 mg | ORAL_TABLET | Freq: Every day | ORAL | 3 refills | Status: DC

## 2022-08-26 DIAGNOSIS — E119 Type 2 diabetes mellitus without complications: Secondary | ICD-10-CM | POA: Diagnosis not present

## 2022-09-01 ENCOUNTER — Other Ambulatory Visit: Payer: Self-pay | Admitting: Family Medicine

## 2022-09-07 ENCOUNTER — Ambulatory Visit (INDEPENDENT_AMBULATORY_CARE_PROVIDER_SITE_OTHER): Payer: 59 | Admitting: Family Medicine

## 2022-09-07 ENCOUNTER — Telehealth: Payer: Self-pay

## 2022-09-07 VITALS — BP 136/82 | HR 66 | Ht 62.0 in | Wt 144.1 lb

## 2022-09-07 DIAGNOSIS — M25552 Pain in left hip: Secondary | ICD-10-CM | POA: Insufficient documentation

## 2022-09-07 MED ORDER — TRULICITY 0.75 MG/0.5ML ~~LOC~~ SOAJ: 0.7500 mg | SUBCUTANEOUS | 2 refills | Status: DC

## 2022-09-07 MED ORDER — DICLOFENAC SODIUM 1 % EX GEL: 2.0000 g | Freq: Four times a day (QID) | CUTANEOUS | 2 refills | Status: DC

## 2022-09-07 NOTE — Telephone Encounter (Signed)
I called pt ([phone]/[id number] verified) to do her AWV over the phone. Pt states she was in alot of pain and wanted to reschedule. I rescheduled pt for 09/27/22 '@1pm'$  while pt on the phone. Pt re-iterated the appt time.

## 2022-09-07 NOTE — Patient Instructions (Signed)
The pain that you are having in your hip seems most consistent with trochanteric bursitis (this is inflammation of the fluid-filled sac that helps protect your muscles when it gets inflamed can cause a lot of pain).  Unfortunately 52 to Xarelto, I cannot give you any anti-inflammatories.  I would recommend taking Tylenol as needed and would also recommend getting the lidocaine patches 4% that are available over-the-counter.  I am also sending in for Voltaren gel that you can use topically on the area 4 times a day as needed.  I want you to start doing the home exercises that I gave you, if you are not showing improvement over the next couple of weeks then I would want you to consider doing formal physical therapy or if you wanted to go ahead and do a small steroid injection over the painful area to help calm it down.

## 2022-09-07 NOTE — Progress Notes (Signed)
Remote pacemaker transmission.   

## 2022-09-07 NOTE — Progress Notes (Signed)
    SUBJECTIVE:   CHIEF COMPLAINT / HPI:   Left leg pain - Aching since Friday - No known injury to the area  - Has been having some pain with movements but especially when laying down to sleep - Pain not waking her from sleep at this time - Not currently using OTCs for treatment.   PERTINENT  PMH / PSH: Afib on xarelto, GERD  OBJECTIVE:   BP 136/82   Pulse 66   Ht '5\' 2"'$  (1.575 m)   Wt 144 lb 2 oz (65.4 kg)   SpO2 100%   BMI 26.36 kg/m   General: NAD, well-appearing, well-nourished Respiratory: No respiratory distress, breathing comfortably, able to speak in full sentences Skin: warm and dry, no rashes noted on exposed skin Psych: Appropriate affect and mood Left Hip:  - Inspection: No gross deformity, no swelling, erythema, or ecchymosis - Palpation: No TTP, specifically none over greater trochanter - ROM: Normal range of motion on Flexion, extension, abduction, internal and external rotation - Strength: Normal strength. - Neuro/vasc: NV intact distally - Special Tests: Negative FABER and FADIR.  Negative Scour.  Negative Trendelenberg.  Negative Ober's.   Greater trochanteric pain syndrome  ASSESSMENT/PLAN:   Greater trochanteric pain syndrome of left lower extremity Physical and history consistent with trochanteric pain syndrome of the left leg. Patient has had this previously and declines injection today. Is on many medications including ARB and xarelto, which limits medications available for treatment; did not tolerate gabapentin in the past.  - Home exercises given - Tylenol PRN - Lidocaine patches PRN - Follow-up in 4 weeks if not improving, may need to consider injection or formal PT at that time.    - Had steroid injection in the past for this, declines today    Joleene Burnham, [place]

## 2022-09-07 NOTE — Telephone Encounter (Signed)
Patient was seen in clinic today. Transportation. Form was placed in PCP's box for completion . Salvatore Marvel, CMA

## 2022-09-07 NOTE — Assessment & Plan Note (Signed)
Physical and history consistent with trochanteric pain syndrome of the left leg. Patient has had this previously and declines injection today. Is on many medications including ARB and xarelto, which limits medications available for treatment; did not tolerate gabapentin in the past.  - Home exercises given - Tylenol PRN - Lidocaine patches PRN - Follow-up in 4 weeks if not improving, may need to consider injection or formal PT at that time.

## 2022-09-08 ENCOUNTER — Other Ambulatory Visit: Payer: Self-pay | Admitting: Family Medicine

## 2022-09-08 MED ORDER — FLUTICASONE PROPIONATE HFA 110 MCG/ACT IN AERO: INHALATION_SPRAY | RESPIRATORY_TRACT | 2 refills | Status: DC

## 2022-09-12 ENCOUNTER — Other Ambulatory Visit: Payer: Self-pay

## 2022-09-13 MED ORDER — INSULIN LISPRO (1 UNIT DIAL) 100 UNIT/ML (KWIKPEN): 12.0000 [IU] | PEN_INJECTOR | Freq: Two times a day (BID) | SUBCUTANEOUS | 11 refills | Status: DC

## 2022-09-15 NOTE — Telephone Encounter (Signed)
Patient called and informed that forms are ready for pick up. Copy made and placed in batch scanning. Original placed at front desk for pick up.   Thaer Miyoshi C Manolo Bosket, RN  

## 2022-09-20 ENCOUNTER — Other Ambulatory Visit: Payer: Self-pay | Admitting: Family Medicine

## 2022-09-20 DIAGNOSIS — Z794 Long term (current) use of insulin: Secondary | ICD-10-CM

## 2022-09-26 DIAGNOSIS — E119 Type 2 diabetes mellitus without complications: Secondary | ICD-10-CM | POA: Diagnosis not present

## 2022-09-27 ENCOUNTER — Ambulatory Visit (INDEPENDENT_AMBULATORY_CARE_PROVIDER_SITE_OTHER): Payer: 59

## 2022-09-27 VITALS — BP 126/78

## 2022-09-27 DIAGNOSIS — Z0000 Encounter for general adult medical examination without abnormal findings: Secondary | ICD-10-CM | POA: Diagnosis not present

## 2022-09-27 NOTE — Progress Notes (Signed)
I connected with  Mercedes Barr on 09/27/22 by a audio enabled telemedicine application and verified that I am speaking with the correct person using two identifiers.  Patient Location: Home  Provider Location: Home Office  I discussed the limitations of evaluation and management by telemedicine. The patient expressed understanding and agreed to proceed.  Subjective:   Mercedes Barr is a 77 y.o. female who presents for Medicare Annual (Subsequent) preventive examination.  Review of Systems    Per HPI unless specifically indicated below.  Cardiac Risk Factors include: advanced age (>44mn, >>41women);female gender, Essential Hypertension, and Hyperlipidemia.          Objective:       09/27/2022   11:29 AM 09/07/2022   11:08 AM 08/17/2022    8:39 AM  Vitals with BMI  Height  5' 2"$    Weight  144 lbs 2 oz 143 lbs  BMI  [id number] Systolic [id number] Diastolic 78 82 64  Pulse  66 78    Today's Vitals   09/27/22 1129 09/27/22 1136  BP: 126/78   PainSc:  7    There is no height or weight on file to calculate BMI.     09/27/2022   11:43 AM 09/07/2022   11:09 AM 08/17/2022    8:38 AM 08/10/2022    9:10 AM 07/06/2022   10:48 AM 05/10/2022    9:31 AM 03/23/2022   11:05 AM  Advanced Directives  Does Patient Have a Medical Advance Directive? No No No No No No No  Would patient like information on creating a medical advance directive?  No - Patient declined No - Patient declined No - Patient declined No - Patient declined No - Patient declined No - Patient declined    Current Medications (verified) Outpatient Encounter Medications as of 09/27/2022  Medication Sig   albuterol (VENTOLIN HFA) 108 (90 Base) MCG/ACT inhaler INHALE 2 PUFFS EVERY 4 HOURS AS NEEDED FOR WHEEZING   Blood Glucose Monitoring Suppl (ONE TOUCH ULTRA 2) w/Device KIT Use to check blood sugar as directed.   budesonide-formoterol (SYMBICORT) 80-4.5 MCG/ACT inhaler Inhale 2 puffs into the lungs 2 (two)  times daily.   cetirizine (ZYRTEC ALLERGY) 10 MG tablet Take 1 tablet (10 mg total) by mouth daily.   citalopram (CELEXA) 20 MG tablet TAKE 1 TABLET BY MOUTH IN THE  MORNING   diclofenac Sodium (VOLTAREN ARTHRITIS PAIN) 1 % GEL Apply 2 g topically 4 (four) times daily.   Dulaglutide (TRULICITY) [id number] Inject 0.75 mg into the skin once a week.   empagliflozin (JARDIANCE) 25 MG TABS tablet Take 1 tablet (25 mg total) by mouth daily.   EPIPEN 2-PAK 0.3 MG/0.3ML SOAJ injection Inject 0.3 mLs (0.3 mg  total) into the muscle as  needed for severe allergic  reaction, and contact  medical provider.   famotidine (PEPCID) 20 MG tablet TAKE 1 TABLET BY MOUTH TWICE A DAY   furosemide (LASIX) 20 MG tablet TAKE 1 TABLET BY MOUTH IN THE  MORNING   glucose blood (ONE TOUCH ULTRA TEST) test strip Use for blood sugar testing 1-3 times daily   guaifenesin (ROBITUSSIN) 100 MG/5ML syrup TAKE 10 ML BY MOUTH 4 TIMES A DAY AS NEEDED FOR COUGH   insulin lispro (HUMALOG KWIKPEN) 100 UNIT/ML KwikPen Inject 12 Units into the skin 2 (two) times daily before a meal.   Insulin Pen Needle (BD PEN NEEDLE NANO U/F) 32G X 4 MM MISC USE  AS DIRECTED WITH INSULIN 4  TIMES DAILY   Insulin Pen Needle 31G X 4 MM MISC 1 each by Does not apply route daily.   LANTUS SOLOSTAR 100 UNIT/ML Solostar Pen INJECT SUBCUTANEOUSLY 40 UNITS  DAILY   levothyroxine (SYNTHROID) 112 MCG tablet TAKE 1 TABLET (112 MCG TOTAL) BY MOUTH EVERY MORNING. 30 MINUTES BEFORE FOOD   losartan (COZAAR) 25 MG tablet TAKE 1 TABLET BY MOUTH DAILY   omeprazole (PRILOSEC) 20 MG capsule TAKE 1 CAPSULE BY MOUTH  TWICE DAILY   polyethylene glycol powder (GLYCOLAX/MIRALAX) powder TAKE 17 GRAMS BY MOUTH 2  TIMES DAILY AS NEEDED FOR  LAXATIVE   potassium chloride SA (KLOR-CON M) 20 MEQ tablet Take 2 tablets (40 mEq total) by mouth daily.   PRODIGY NO CODING BLOOD GLUC test strip USE THREE TIMES A DAY TO CHECK ON BLOOD GLUCOSE LEVEL   propranolol (INDERAL) 40 MG  tablet Take 1 tablet (40 mg total) by mouth daily.   rosuvastatin (CRESTOR) 20 MG tablet TAKE 1 TABLET BY MOUTH EVERY DAY   Travoprost, BAK Free, (TRAVATAN) 0.004 % SOLN ophthalmic solution Place 1 drop into both eyes at bedtime.   XARELTO 20 MG TABS tablet TAKE 1 TABLET BY MOUTH DAILY WITH SUPPER.   acetaminophen (TYLENOL) 325 MG tablet Take 650 mg by mouth every 6 (six) hours as needed for moderate pain (Right knee pain). (Patient not taking: Reported on 08/10/2022)   fluticasone (FLOVENT HFA) 110 MCG/ACT inhaler USE 1 INHALATION BY MOUTH TWICE  DAILY (Patient not taking: Reported on 09/27/2022)   Lancets MISC Use with blood sugar monitor to check daily   Omega-3 Fatty Acids (FISH OIL) 1000 MG CAPS Take 1 capsule by mouth every morning. Reported on 02/11/2016 (Patient not taking: Reported on 09/27/2022)   Spacer/Aero-Holding Chambers (AEROCHAMBER PLUS) inhaler Use as instructed   [DISCONTINUED] albuterol (PROVENTIL,VENTOLIN) 90 MCG/ACT inhaler Inhale 2 puffs into the lungs every 4 (four) hours as needed for wheezing or shortness of breath.   [DISCONTINUED] fluticasone (FLONASE) 50 MCG/ACT nasal spray Place 2 sprays into both nostrils daily for 14 days.   No facility-administered encounter medications on file as of 09/27/2022.    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 Faulk.  OFFICE NOTE DR. A. LITTLE STATES  "EXTREMELY PACEMAKER SENSITIVE AND WHEN  YOU TRY TO CHECK FOR UNDERLYING RHYTHMS SHE WILL HAVE SNYCOPE AND WE HAVE NOT DONE THIS NOW IN ABOUT 2 YEARS".   Shortness of breath dyspnea    walking distance or climbing stairs   Past Surgical History:  Procedure Laterality Date   ATRIOVENTRICULAR CUSHION DEFECT REPAIR  1988   CARDIAC CATHETERIZATION  2011    SHOWED NO CAD-PER CARDIOLOGY OFFICE NOTES DR. A. LITTLE   colonscopy      removed polyps    INSERT / REPLACE / REMOVE PACEMAKER     PACEMAKER INSERTION  1988   last gen 11/08- MDT   [place]  N/A 02/15/2017   Procedure: PPM Generator Changeout;  Surgeon: Sanda Klein, MD;  Location: [facility];  Service: Cardiovascular;  Laterality: N/A;   TOTAL KNEE ARTHROPLASTY Right 11/19/2012   Procedure: RIGHT TOTAL KNEE ARTHROPLASTY;  Surgeon: Gearlean Alf, MD;  Location: [facility];  Service: Orthopedics;  Laterality: Right;   TOTAL KNEE REVISION Right 09/24/2014   Procedure: RIGHT TOTAL KNEE ARTHROPLASTY REVISION;  Surgeon: Gearlean Alf, MD;  Location: [facility];  Service: Orthopedics;  Laterality: Right;   TUBAL LIGATION     Family History  Problem Relation Age of Onset   Heart failure Mother    Hypertension Mother    Stroke Father    Heart failure Sister    Kidney disease Daughter    Cancer Brother    Breast cancer Neg Hx    Social History   Socioeconomic History   Marital status: Divorced    Spouse name: Not on file   Number of children: 3   Years of education: 12   Highest education level: High school graduate  Occupational History   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     Patient enjoys walking around her neighborhood with her 2 neighbors.    Patient does not drive, however she takes SCAT to all her medical apts.    Social Determinants of Health   Financial Resource Strain: Low Risk  (09/27/2022)   Overall Financial Resource Strain (CARDIA)    Difficulty of Paying Living Expenses: Not hard at all  Food Insecurity: No Food Insecurity (09/27/2022)   Hunger Vital Sign    Worried About Running Out of Food in the Last Year: Never true    Ran Out of Food in the Last Year: Never true  Transportation Needs: No Transportation Needs (09/27/2022)   PRAPARE - Hydrologist (Medical): No    Lack of Transportation (Non-Medical): No  Physical Activity: Insufficiently Active (09/27/2022)   Exercise Vital Sign    Days of Exercise per Week: 2 days    Minutes of Exercise per Session: 10 min  Stress: No Stress Concern Present (09/27/2022)   [place]    Feeling of Stress : Not at all  Social Connections: Moderately Isolated (09/27/2022)   Social Connection and Isolation Panel [NHANES]    Frequency of Communication with Friends and Family: More than three times a week    Frequency of Social Gatherings with Friends and Family: More than three times a week    Attends Religious Services: 1 to 4 times per year    Active Member of [employer] or Organizations: No    Attends Music therapist: Never    Marital Status: Divorced    Tobacco Counseling Counseling given: No   Clinical Intake:  Pre-visit preparation completed: No  Pain : 0-10 Pain Score: 7  Pain Type: Chronic pain Pain Location: Knee Pain Orientation: Right Pain Descriptors / Indicators: Aching, Throbbing Pain Onset: More than a month ago Pain Frequency: Constant  Nutritional Status: BMI of 19-24  Normal Nutritional Risks: None Diabetes: Yes CBG done?: No Did pt. bring in CBG monitor from home?:  No  How often do you need to have someone help you when you read instructions, pamphlets, or other written materials from your doctor or pharmacy?: 1 - Never  Diabetic?Nutrition Risk Assessment:  Has the patient had any N/V/D within the last 2 months?  No  Does the patient have any non-healing wounds?  No  Has the patient had any unintentional weight loss or weight gain?  No   Diabetes:  Is the patient diabetic?  Yes  If diabetic, was a CBG obtained today?  Yes  Did the patient bring in their glucometer from home?  No  How often do you monitor your CBG's? Three times a day .   Financial Strains and Diabetes Management:  Are you having any financial strains with the device, your supplies or your medication? No .  Does the patient want to be seen by Chronic Care Management for management of their diabetes?  No  Would the patient like to be referred to a Nutritionist or for Diabetic Management?  No   Diabetic Exams:  Diabetic Eye Exam: Completed 10/06/2021 Diabetic Foot Exam: Completed 03/23/2022    Interpreter Needed?: No  Information entered by :: Donnie Mesa, [place]   Activities of Daily Living    09/27/2022   11:34 AM  In your present state of health, do you have any difficulty performing the following activities:  Hearing? 0  Vision? 1  Difficulty concentrating or making decisions? 0  Walking or climbing stairs? 0  Dressing or bathing? 0  Doing errands, shopping? 0    Patient Care Team: Rise Patience, DO as PCP - General (Family Medicine) Croitoru, Dani Gobble, MD as PCP - Cardiology (Cardiology) Croitoru, Dani Gobble, MD as PCP - Electrophysiology (Cardiology) Lovena Neighbours, DMD (Dentistry) Rutherford Guys, MD as Consulting Physician (Ophthalmology) Croitoru, Dani Gobble, MD as Consulting Physician (Cardiology)  Indicate any recent Medical Services you may have received from other than [facility] providers in the past year (date may be approximate).     Assessment:   This is a  routine wellness examination for Mercedes Barr.  Hearing/Vision screen Denies any hearing issues. Denies any vision changes. Annual Eye Exam, Rutherford Guys, MD  Dietary issues and exercise activities discussed: Current Exercise Habits: Home exercise routine, Type of exercise: walking, Time (Minutes): 10, Frequency (Times/Week): 2, Weekly Exercise (Minutes/Week): 20, Intensity: Mild, Exercise limited by: orthopedic condition(s)   Goals Addressed   None    Depression Screen    09/27/2022   11:33 AM 09/07/2022   11:09 AM 08/17/2022    8:41 AM 08/10/2022    9:09 AM 07/06/2022   10:47 AM 05/10/2022    9:33 AM 03/23/2022   11:05 AM  PHQ 2/9 Scores  PHQ - 2 Score 0 2 0 0 0 1 0  PHQ- 9 Score 1 10 1 6 7 3 4    $ Fall Risk    09/27/2022   11:34 AM 09/07/2022   11:09 AM 08/17/2022    8:39 AM 08/10/2022    9:09 AM 07/06/2022   10:47 AM  Fall Risk   Falls in the past year? 0 0 0 0 0  Number falls in past yr: 0 0 0 0 0  Injury with Fall? 0 0 0 0 0  Risk for fall due to : No Fall Risks      Follow up Falls evaluation completed  Falls prevention  discussed      FALL RISK PREVENTION PERTAINING TO THE HOME:  Any stairs in or around the home? No  If so, are there any without handrails? No  Home free of loose throw rugs in walkways, pet beds, electrical cords, etc? Yes  Adequate lighting in your home to reduce risk of falls? Yes   ASSISTIVE DEVICES UTILIZED TO PREVENT FALLS:  Life alert? No  Use of a cane, walker or w/c? Yes  Grab bars in the bathroom? Yes  Shower chair or bench in shower? No  Elevated toilet seat or a handicapped toilet? Yes   TIMED UP AND GO:  Was the test performed? Unable to perform, virtual appointment   Cognitive Function:        09/27/2022   11:35 AM 02/11/2019    9:04 AM  6CIT Screen  What Year? 0 points 0 points  What month? 0 points 0 points  What time? 0 points 0 points  Count back from 20 0 points 0 points  Months in reverse  0 points  Repeat phrase 0 points 0  points  Total Score  0 points    Immunizations Immunization History  Administered Date(s) Administered   COVID-19, mRNA, vaccine(Comirnaty)12 years and older 08/17/2022   Fluad Quad(high Dose 65+) 05/26/2021, 05/10/2022   Influenza Split 09/07/2011, 05/10/2012   Influenza Whole 06/01/2007, 05/28/2008, 07/07/2009, 07/15/2010   Influenza,inj,Quad PF,6+ Mos 10/21/2013, 08/18/2014, 04/27/2015, 06/10/2016, 07/25/2018, 05/03/2019   Influenza-Unspecified 05/08/2017   PFIZER Comirnaty(Gray Top)Covid-19 Tri-Sucrose Vaccine 10/01/2019, 10/22/2019   Pfizer Covid-19 Vaccine Bivalent Booster 31yr & up 05/26/2021   Pneumococcal Conjugate-13 05/21/2015   Pneumococcal Polysaccharide-23 06/01/2007, 06/08/2012   Td 01/06/2002    TDAP status: Due, Education has been provided regarding the importance of this vaccine. Advised may receive this vaccine at local pharmacy or Health Dept. Aware to provide a copy of the vaccination record if obtained from local pharmacy or Health Dept. Verbalized acceptance and understanding.  Flu Vaccine status: Up to date  Pneumococcal vaccine status: Up to date  Covid-19 vaccine status: Completed vaccines  Qualifies for Shingles Vaccine? Yes   Zostavax completed No   Shingrix Completed?: No.    Education has been provided regarding the importance of this vaccine. Patient has been advised to call insurance company to determine out of pocket expense if they have not yet received this vaccine. Advised may also receive vaccine at local pharmacy or Health Dept. Verbalized acceptance and understanding.  Screening Tests Health Maintenance  Topic Date Due   Zoster Vaccines- Shingrix (1 of 2) Never done   DTaP/Tdap/Td (2 - Tdap) 01/07/2012   OPHTHALMOLOGY EXAM  10/07/2022   HEMOGLOBIN A1C  02/08/2023   FOOT EXAM  03/24/2023   Diabetic kidney evaluation - eGFR measurement  06/08/2023   Diabetic kidney evaluation - Urine ACR  08/18/2023   Medicare Annual Wellness (AWV)   09/28/2023   Pneumonia Vaccine 77 Years old  Completed   INFLUENZA VACCINE  Completed   DEXA SCAN  Completed   COVID-19 Vaccine  Completed   Hepatitis C Screening  Completed   HPV VACCINES  Aged Out   COLONOSCOPY (Pts 45-474yrInsurance coverage will need to be confirmed)  Discontinued    Health Maintenance  Health Maintenance Due  Topic Date Due   Zoster Vaccines- Shingrix (1 of 2) Never done   DTaP/Tdap/Td (2 - Tdap) 01/07/2012    Colon Cancer Screening: The pt state [facility]  I requested the results.  Mammogram: Overdue, Last mammogram 03/10/21 DEXA Scan: 03/22/2019  Lung Cancer Screening: (Low Dose CT Chest recommended if Age 26-80 years, 30 pack-year currently smoking OR have quit w/in 15years.) does not qualify.   Lung Cancer Screening Referral: not appliable   Additional Screening:  Hepatitis C Screening: does qualify; Completed 02/11/2016  Vision Screening: Recommended annual ophthalmology exams for early detection of glaucoma and other disorders of the eye. Is the patient up to date with their annual eye exam?  Yes  Who is the provider or what is the name of the office in which the patient attends annual eye exams? Rutherford Guys, MD If pt is not established with a provider, would they like to be referred to a provider to establish care? No .   Dental Screening: Recommended annual dental exams for proper oral hygiene  Community Resource Referral / Chronic Care Management: CRR required this visit?  No   CCM required this visit?  No      Plan:     I have personally reviewed and noted the following in the patient's chart:   Medical and social history Use of alcohol, tobacco or illicit drugs  Current medications and supplements including opioid prescriptions. Patient is not currently taking opioid prescriptions. Functional ability and status Nutritional status Physical activity Advanced  directives List of other physicians Hospitalizations, surgeries, and ER visits in previous 12 months Vitals Screenings to include cognitive, depression, and falls Referrals and appointments  In addition, I have reviewed and discussed with patient certain preventive protocols, quality metrics, and best practice recommendations. A written personalized care plan for preventive services as well as general preventive health recommendations were provided to patient.     Mercedes Barr , Thank you for taking time to come for your Medicare Wellness Visit. I appreciate your ongoing commitment to your health goals. Please review the following plan we discussed and let me know if I can assist you in the future.   These are the goals we discussed:  Goals       Blood Pressure < 140/90      Care Coordination Activities (pt-stated)      Care Coordination Interventions: Reviewed scheduled/upcoming provider appointments including . Discussed plans with patient for ongoing care management follow up and provided patient with direct contact information for care management team Screening for signs and symptoms of depression related to chronic disease state   Care Coordination Interventions: Assessed social determinant of health barriers  Patient advised no additional resources needed at this time.      DIET - REDUCE SUGAR INTAKE (pt-stated)      Limit carbs.       Exercise 120 minutes per week (moderate activity)             HEMOGLOBIN A1C < 8      Weight < 128 lb (58.06 kg)      7 %   Weight loss        This is a list of the screening recommended for you and due dates:  Health Maintenance  Topic Date Due   Zoster (Shingles) Vaccine (1 of 2) Never done   DTaP/Tdap/Td vaccine (2 - Tdap) 01/07/2012   Eye exam for diabetics  10/07/2022   Hemoglobin A1C  02/08/2023   Complete foot exam   03/24/2023   Yearly kidney function blood test for diabetes  06/08/2023   Yearly kidney health urinalysis  for diabetes  08/18/2023   Medicare Annual Wellness Visit  09/28/2023   Pneumonia Vaccine  Completed   Flu Shot  Completed   DEXA scan (bone density measurement)  Completed   COVID-19 Vaccine  Completed   Hepatitis C Screening: USPSTF Recommendation to screen - Ages 40-79 yo.  Completed   HPV Vaccine  Aged Out   Colon Cancer Screening  Discontinued    Wilson Singer, [facility]   09/27/2022   Nurse Notes: Approximately 30 minute Non-Face -To-Face Medicare Wellness Visit

## 2022-09-27 NOTE — Patient Instructions (Signed)

## 2022-09-29 ENCOUNTER — Other Ambulatory Visit: Payer: Self-pay

## 2022-09-30 MED ORDER — INSULIN LISPRO (1 UNIT DIAL) 100 UNIT/ML (KWIKPEN): 12.0000 [IU] | PEN_INJECTOR | Freq: Two times a day (BID) | SUBCUTANEOUS | 11 refills | Status: DC

## 2022-10-03 ENCOUNTER — Telehealth: Payer: Self-pay

## 2022-10-03 NOTE — Telephone Encounter (Signed)
Patient LVM on nurse line requesting a refill on "eye drops" to mail order pharmacy.   She reports she has been getting them at [facility], however reports they do not have stock of medication at this time. She confirmed stock with mail order pharmacy.  I attempted to call her back. The only eye drops on list were filled back in 2016. However, no answer.  Will forward to PCP.

## 2022-10-05 ENCOUNTER — Other Ambulatory Visit: Payer: Self-pay | Admitting: Family Medicine

## 2022-10-05 DIAGNOSIS — I5030 Unspecified diastolic (congestive) heart failure: Secondary | ICD-10-CM

## 2022-10-05 DIAGNOSIS — I10 Essential (primary) hypertension: Secondary | ICD-10-CM

## 2022-10-12 ENCOUNTER — Ambulatory Visit (INDEPENDENT_AMBULATORY_CARE_PROVIDER_SITE_OTHER): Payer: 59 | Admitting: Podiatry

## 2022-10-12 ENCOUNTER — Encounter: Payer: Self-pay | Admitting: Podiatry

## 2022-10-12 DIAGNOSIS — M79675 Pain in left toe(s): Secondary | ICD-10-CM | POA: Diagnosis not present

## 2022-10-12 DIAGNOSIS — M79674 Pain in right toe(s): Secondary | ICD-10-CM | POA: Diagnosis not present

## 2022-10-12 DIAGNOSIS — B351 Tinea unguium: Secondary | ICD-10-CM | POA: Diagnosis not present

## 2022-10-12 DIAGNOSIS — E119 Type 2 diabetes mellitus without complications: Secondary | ICD-10-CM | POA: Diagnosis not present

## 2022-10-12 DIAGNOSIS — Z794 Long term (current) use of insulin: Secondary | ICD-10-CM

## 2022-10-12 NOTE — Progress Notes (Signed)
This patient returns to my office for at risk foot care.  This patient requires this care by a professional since this patient will be at risk due to having type 2 diabetes.  This patient is unable to cut nails herself since the patient cannot reach her nails.These nails are painful walking and wearing shoes.  This patient presents for at risk foot care today.  General Appearance  Alert, conversant and in no acute stress.  Vascular  Dorsalis pedis and posterior tibial  pulses are palpable  bilaterally.  Capillary return is within normal limits  bilaterally. Temperature is within normal limits  bilaterally.  Neurologic  Senn-Weinstein monofilament wire test within normal limits  bilaterally. Muscle power within normal limits bilaterally.  Nails Thick disfigured discolored nails with subungual debris  from hallux to fifth toes bilaterally. No evidence of bacterial infection or drainage bilaterally.  Orthopedic  No limitations of motion  feet .  No crepitus or effusions noted.  No bony pathology or digital deformities noted.  Midfoot arthritis.  Skin  normotropic skin with no porokeratosis noted bilaterally.  No signs of infections or ulcers noted.     Onychomycosis  Pain in right toes  Pain in left toes  Consent was obtained for treatment procedures.   Mechanical debridement of nails 1-5  bilaterally performed with a nail nipper.  Filed with dremel without incident.     Return office visit    3 months                  Told patient to return for periodic foot care and evaluation due to potential at risk complications.   Gardiner Barefoot DPM

## 2022-10-27 ENCOUNTER — Other Ambulatory Visit: Payer: Self-pay

## 2022-10-28 ENCOUNTER — Other Ambulatory Visit: Payer: Self-pay | Admitting: Family Medicine

## 2022-10-28 DIAGNOSIS — I4819 Other persistent atrial fibrillation: Secondary | ICD-10-CM

## 2022-10-28 MED ORDER — FLUTICASONE PROPIONATE HFA 110 MCG/ACT IN AERO: INHALATION_SPRAY | RESPIRATORY_TRACT | 2 refills | Status: DC

## 2022-10-31 ENCOUNTER — Other Ambulatory Visit: Payer: Self-pay | Admitting: Family Medicine

## 2022-11-01 ENCOUNTER — Telehealth: Payer: Self-pay

## 2022-11-01 NOTE — Telephone Encounter (Signed)
Patient calls nurse line requesting to schedule appointment for right sided abdominal/back pain. She reports that she experiences this after every time she takes Trulicity injection.   She typically takes this on Friday and symptoms will last for a few days and then start improving. She states that they return shortly after taking Trulicity injection.   Scheduled for evaluation tomorrow morning. ED precautions discussed.   Talbot Grumbling, RN

## 2022-11-02 ENCOUNTER — Other Ambulatory Visit: Payer: Self-pay

## 2022-11-02 ENCOUNTER — Ambulatory Visit (INDEPENDENT_AMBULATORY_CARE_PROVIDER_SITE_OTHER): Payer: 59 | Admitting: Student

## 2022-11-02 VITALS — BP 122/64 | HR 70 | Ht 62.0 in | Wt 141.0 lb

## 2022-11-02 DIAGNOSIS — R109 Unspecified abdominal pain: Secondary | ICD-10-CM | POA: Insufficient documentation

## 2022-11-02 DIAGNOSIS — R1084 Generalized abdominal pain: Secondary | ICD-10-CM

## 2022-11-02 MED ORDER — HYDROCODONE-ACETAMINOPHEN 5-325 MG PO TABS: ORAL_TABLET | ORAL | 0 refills | Status: DC

## 2022-11-02 NOTE — Progress Notes (Signed)
  SUBJECTIVE:   CHIEF COMPLAINT / HPI:   Right Sided Abdominal/Back pain Right flank radiating to the back, middle back  Onset x 2 weeks ago No urinary symptoms Experiences this when taking Trulicity injection  Symptoms generally last a few days after injection and then dissipate until the next injection.  No chest pain or dyspnea  No nausea or vomiting, no diarrhea  Takes Robitussin for cough and mucous, but otherwise no new changes  Cough occurs at night x2 weeks as well  Feels worse with bending and twisting  No bruising or trauma to the area No fever or chills  7/10 pain on pain scale No rash   PERTINENT  PMH / PSH:  HFpEF COPD Hypothyroidism  DM2 Osteoporosis  OA HLD  Glaucoma  Persistent Afib on AC   Patient Care Team: Rise Patience, DO as PCP - General (Family Medicine) Croitoru, Dani Gobble, MD as PCP - Cardiology (Cardiology) Croitoru, Mihai, MD as PCP - Electrophysiology (Cardiology) Lovena Neighbours, DMD (Dentistry) Rutherford Guys, MD as Consulting Physician (Ophthalmology) Croitoru, Dani Gobble, MD as Consulting Physician (Cardiology) OBJECTIVE:  BP 122/64   Pulse 70   Ht 5\' 2"  (1.575 m)   Wt 141 lb (64 kg)   SpO2 97%   BMI 25.79 kg/m  Physical Exam  General: Alert and oriented in no apparent distress; intermittently tearful 2/2 to pain throughout exam  Heart: irregular rate and rhythm with no murmurs appreciated Lungs: CTA bilaterally, no wheezing Abdomen: Bowel sounds present, no abdominal pain, significant tenderness on right flank to thoracic paraspinal muscles, no bony tenderness over the spine, TTP with CVA on right Skin: Warm and dry, no rash or bruising  Extremities: No lower extremity edema   ASSESSMENT/PLAN:  Flank pain Assessment & Plan: Ddx: gallstones given associated pain with Trulicity and increased risk on GLP1 (RUQ U/S ordered), pancreatitis not as likely given presentation but could still cause referred pain (lipase ordered), liver etiology  (CMP ordered), GU etiology (UA in clinic but patient is without urinary symptoms or hematuria), vascular cause does not appear to be the cause but will keep a close eye on this, infectious less likely due to lack of systemic symptoms, referred pain from PNA (evaluate via CXR), psoas bleed/retroperitoneal hematoma seems less likely as well but higher risk in this patient given chronic AC. Will check CBC for Hgb and will have her return in 1 week for very close follow up. I have provided her with strict return precautions.   Multiple allergies, with shared decision making, opted to continue with low dose hydrocodone/tylenol to take at night before bed with Zyrtec if needed (itching from oxycodone and we are limited on pain control given allergies and comorbidities). No trulicity injection until we are able to assess thoroughly.    May need CT scan if symptoms persist and imaging, labs return normal.   Orders: -     Urinalysis -     US ABDOMEN LIMITED RUQ (LIVER/GB); Future -     CBC with Differential/Platelet -     Comprehensive metabolic panel -     Lipase -     DG Chest 2 View; Future -     HYDROcodone-Acetaminophen; Take half a tablet at night as needed for pain.  Dispense: 7 tablet; Refill: 0   Return in about 1 week (around 11/09/2022). Erskine Emery, MD 11/02/2022, 9:11 AM PGY-2, [place]

## 2022-11-02 NOTE — Patient Instructions (Addendum)
It was great to see you today! Thank you for choosing [facility] for your primary care. Mercedes Barr was seen for follow up.  Today we addressed: I will order a chest XR for you to get at [place] [address]  We will schedule a RUQ ultrasound  You can use the hydrocodone at night before bed, 1/2 tablet, will make you sleepy You can use a Zyrtec with the medication to prevent itching  Call if you have any concerns  I will update you with lab results Return in 1 week   If you haven't already, sign up for My Chart to have easy access to your labs results, and communication with your primary care physician.  I recommend that you always bring your medications to each appointment as this makes it easy to ensure you are on the correct medications and helps [place] not miss refills when you need them. Call the clinic at [phone] if your symptoms worsen or you have any concerns.  You should return to our clinic Return in about 1 week (around 11/09/2022). Please arrive 15 minutes before your appointment to ensure smooth check in process.  We appreciate your efforts in making this happen.  Thank you for allowing me to participate in your care, Erskine Emery, MD 11/02/2022, 8:56 AM PGY-2, [place]

## 2022-11-02 NOTE — Assessment & Plan Note (Addendum)
Ddx: gallstones given associated pain with Trulicity and increased risk on GLP1 (RUQ U/S ordered), pancreatitis not as likely given presentation but could still cause referred pain (lipase ordered), liver etiology (CMP ordered), GU etiology (UA in clinic but patient is without urinary symptoms or hematuria), vascular cause does not appear to be the cause but will keep a close eye on this, infectious less likely due to lack of systemic symptoms, referred pain from PNA (evaluate via CXR), psoas bleed/retroperitoneal hematoma seems less likely as well but higher risk in this patient given chronic AC. Will check CBC for Hgb and will have her return in 1 week for very close follow up. I have provided her with strict return precautions.   Multiple allergies, with shared decision making, opted to continue with low dose hydrocodone/tylenol to take at night before bed with Zyrtec if needed (itching from oxycodone and we are limited on pain control given allergies and comorbidities). No trulicity injection until we are able to assess thoroughly.    May need CT scan if symptoms persist and imaging, labs return normal.

## 2022-11-03 ENCOUNTER — Telehealth: Payer: Self-pay

## 2022-11-03 LAB — URINALYSIS
Bilirubin, UA: NEGATIVE
Ketones, UA: NEGATIVE
Nitrite, UA: NEGATIVE
Protein,UA: NEGATIVE
RBC, UA: NEGATIVE
Specific Gravity, UA: >=1.03 — AB (ref 1.005–1.030)
Urobilinogen, Ur: 0.2 mg/dL (ref 0.2–1.0)
pH, UA: 5.5 (ref 5.0–7.5)

## 2022-11-03 LAB — CBC WITH DIFFERENTIAL/PLATELET
Basophils Absolute: 0 10*3/uL (ref 0.0–0.2)
Basos: 0 %
EOS (ABSOLUTE): 0.3 10*3/uL (ref 0.0–0.4)
Eos: 6 %
Hematocrit: 44.7 % (ref 34.0–46.6)
Hemoglobin: 14.7 g/dL (ref 11.1–15.9)
Immature Grans (Abs): 0 10*3/uL (ref 0.0–0.1)
Immature Granulocytes: 0 %
Lymphocytes Absolute: 2.6 10*3/uL (ref 0.7–3.1)
Lymphs: 45 %
MCH: 27.5 pg (ref 26.6–33.0)
MCHC: 32.9 g/dL (ref 31.5–35.7)
MCV: 84 fL (ref 79–97)
Monocytes Absolute: 0.5 10*3/uL (ref 0.1–0.9)
Monocytes: 8 %
Neutrophils Absolute: 2.4 10*3/uL (ref 1.4–7.0)
Neutrophils: 41 %
Platelets: 199 10*3/uL (ref 150–450)
RBC: 5.35 x10E6/uL — ABNORMAL HIGH (ref 3.77–5.28)
RDW: 13.1 % (ref 11.7–15.4)
WBC: 5.8 10*3/uL (ref 3.4–10.8)

## 2022-11-03 LAB — COMPREHENSIVE METABOLIC PANEL
ALT: 22 IU/L (ref 0–32)
AST: 21 IU/L (ref 0–40)
Albumin/Globulin Ratio: 1.5 (ref 1.2–2.2)
Albumin: 4.4 g/dL (ref 3.8–4.8)
Alkaline Phosphatase: 76 IU/L (ref 44–121)
BUN/Creatinine Ratio: 13 (ref 12–28)
BUN: 12 mg/dL (ref 8–27)
Bilirubin Total: 0.3 mg/dL (ref 0.0–1.2)
CO2: 22 mmol/L (ref 20–29)
Calcium: 9.3 mg/dL (ref 8.7–10.3)
Chloride: 105 mmol/L (ref 96–106)
Creatinine, Ser: 0.94 mg/dL (ref 0.57–1.00)
Globulin, Total: 2.9 g/dL (ref 1.5–4.5)
Glucose: 173 mg/dL — ABNORMAL HIGH (ref 70–99)
Potassium: 4.4 mmol/L (ref 3.5–5.2)
Sodium: 143 mmol/L (ref 134–144)
Total Protein: 7.3 g/dL (ref 6.0–8.5)
eGFR: 63 mL/min/{1.73_m2} (ref >59)

## 2022-11-03 LAB — LIPASE: Lipase: 22 U/L (ref 14–85)

## 2022-11-03 NOTE — Telephone Encounter (Signed)
Patient calls nurse line regarding hydrocodone prescription.   Called and spoke with pharmacist regarding prescription.   States that [place] number was not legible on prescription. Provided with DEA number for provider.   Pharmacist is working on Chiropractor now. Called and provided patient with update.   Talbot Grumbling, RN

## 2022-11-03 NOTE — Telephone Encounter (Addendum)
Hydrocodone-Acetaminophen was just prescribed yesterday by Dr. Zigmund Daniel. Refill not appropriate at this time.  However, PDMP review shows it has not been filled yet. If pharmacy needs clarification, please give specifics and I can send updated prescription.    Alcus Dad, MD PGY-3, [place]

## 2022-11-03 NOTE — Telephone Encounter (Signed)
Pharmacist calls back in regards to Hydrocodone.   He reports he is not able to run the prescription without the DEA number being on physical prescription.   Can we try and resend this?

## 2022-11-04 ENCOUNTER — Other Ambulatory Visit: Payer: Self-pay | Admitting: Student

## 2022-11-04 DIAGNOSIS — R109 Unspecified abdominal pain: Secondary | ICD-10-CM

## 2022-11-04 MED ORDER — HYDROCODONE-ACETAMINOPHEN 5-325 MG PO TABS: ORAL_TABLET | ORAL | 0 refills | Status: DC

## 2022-11-04 NOTE — Progress Notes (Signed)
After nursing spoke to pharmacy, unable to run previous norco/vicodin rx, will resend and have update pharmacy.

## 2022-11-07 ENCOUNTER — Encounter: Payer: Self-pay | Admitting: Student

## 2022-11-08 ENCOUNTER — Telehealth: Payer: Self-pay

## 2022-11-08 NOTE — Telephone Encounter (Signed)
Radiology calls nurse line in regards to [place] order for patient scheduled for tomorrow.   I spoke with tech and she has some confusion on what we are trying to visualize.   If patient is having "flank pain" the order needs to be changed to a renal ultrasound.   If we are wanting to see her liver and gallbladder the diagnoses needs to be changed to abdominal pain.   Will forward to ordering provider.

## 2022-11-09 ENCOUNTER — Ambulatory Visit (HOSPITAL_COMMUNITY)
Admission: RE | Admit: 2022-11-09 | Discharge: 2022-11-09 | Disposition: A | Discharge status: Home / Self Care | Payer: 59 | Source: Ambulatory Visit | Attending: Family Medicine | Admitting: Family Medicine

## 2022-11-09 ENCOUNTER — Other Ambulatory Visit: Payer: Self-pay | Admitting: Student

## 2022-11-09 DIAGNOSIS — R1084 Generalized abdominal pain: Secondary | ICD-10-CM | POA: Diagnosis not present

## 2022-11-09 DIAGNOSIS — R109 Unspecified abdominal pain: Secondary | ICD-10-CM | POA: Insufficient documentation

## 2022-11-10 ENCOUNTER — Ambulatory Visit (HOSPITAL_COMMUNITY): Payer: 59

## 2022-11-10 ENCOUNTER — Other Ambulatory Visit (HOSPITAL_COMMUNITY): Payer: 59

## 2022-11-11 ENCOUNTER — Telehealth: Payer: Self-pay

## 2022-11-11 NOTE — Telephone Encounter (Signed)
Patient calls nurse line regarding results of chest x ray and abdominal ultrasound.   Will forward to ordering provider.   Patient requesting returned call at [phone].  Veronda Prude, RN

## 2022-11-14 ENCOUNTER — Other Ambulatory Visit: Payer: Self-pay | Admitting: Student

## 2022-11-14 DIAGNOSIS — R932 Abnormal findings on diagnostic imaging of liver and biliary tract: Secondary | ICD-10-CM

## 2022-11-15 ENCOUNTER — Telehealth: Payer: Self-pay

## 2022-11-15 NOTE — Telephone Encounter (Signed)
Patient calls nurse line reporting a productive cough.   She reports symptoms started over the weekend. She reports she is coughing up yellowish/brownish phlegm.    She denies any fevers, chills, body aches, congestion or SOB at this time.   Apt offered to patient for this afternoon or tomorrow morning. Patient reports she has transportation issues and declined at this time. She reports she has some OTC cough medication she is going to try.   She reports if symptoms worsen or fail to improve she will call for a clinic visit.   ED precautions discussed with patient.

## 2022-11-16 ENCOUNTER — Ambulatory Visit (INDEPENDENT_AMBULATORY_CARE_PROVIDER_SITE_OTHER): Payer: 59

## 2022-11-16 DIAGNOSIS — I442 Atrioventricular block, complete: Secondary | ICD-10-CM | POA: Diagnosis not present

## 2022-11-16 LAB — CUP PACEART REMOTE DEVICE CHECK
Battery Impedance: 430 Ohm
Battery Remaining Longevity: 102 mo
Battery Voltage: 2.77 V
Brady Statistic RV Percent Paced: 96 %
Date Time Interrogation Session: 20240410075004
Implantable Lead Connection Status: 753985
Implantable Lead Connection Status: 753985
Implantable Lead Implant Date: 19990203
Implantable Lead Implant Date: 19990203
Implantable Lead Location: 753859
Implantable Lead Location: 753860
Implantable Lead Model: 5068
Implantable Lead Model: 5092
Implantable Pulse Generator Implant Date: 20180711
Lead Channel Impedance Value: 641 Ohm
Lead Channel Impedance Value: 67 Ohm
Lead Channel Pacing Threshold Amplitude: 0.875 V
Lead Channel Pacing Threshold Pulse Width: 0.4 ms
Lead Channel Setting Pacing Amplitude: 2.5 V
Lead Channel Setting Pacing Pulse Width: 0.4 ms
Lead Channel Setting Sensing Sensitivity: 4 mV
Zone Setting Status: 755011
Zone Setting Status: 755011

## 2022-11-18 ENCOUNTER — Encounter (HOSPITAL_COMMUNITY): Payer: Self-pay

## 2022-11-18 ENCOUNTER — Emergency Department (HOSPITAL_COMMUNITY): Payer: 59

## 2022-11-18 ENCOUNTER — Ambulatory Visit: Payer: 59 | Admitting: Student

## 2022-11-18 ENCOUNTER — Inpatient Hospital Stay (HOSPITAL_COMMUNITY)
Admission: EM | Admit: 2022-11-18 | Discharge: 2022-11-25 | DRG: 417 | Disposition: A | Discharge status: Home Health | Payer: 59 | Attending: Family Medicine | Admitting: Family Medicine

## 2022-11-18 ENCOUNTER — Other Ambulatory Visit: Payer: Self-pay

## 2022-11-18 DIAGNOSIS — R109 Unspecified abdominal pain: Secondary | ICD-10-CM | POA: Diagnosis not present

## 2022-11-18 DIAGNOSIS — Z96651 Presence of right artificial knee joint: Secondary | ICD-10-CM | POA: Diagnosis present

## 2022-11-18 DIAGNOSIS — K660 Peritoneal adhesions (postprocedural) (postinfection): Secondary | ICD-10-CM | POA: Diagnosis present

## 2022-11-18 DIAGNOSIS — I2489 Other forms of acute ischemic heart disease: Secondary | ICD-10-CM | POA: Diagnosis present

## 2022-11-18 DIAGNOSIS — Z79890 Hormone replacement therapy (postmenopausal): Secondary | ICD-10-CM

## 2022-11-18 DIAGNOSIS — R110 Nausea: Secondary | ICD-10-CM | POA: Diagnosis present

## 2022-11-18 DIAGNOSIS — E0590 Thyrotoxicosis, unspecified without thyrotoxic crisis or storm: Secondary | ICD-10-CM | POA: Diagnosis present

## 2022-11-18 DIAGNOSIS — Z7951 Long term (current) use of inhaled steroids: Secondary | ICD-10-CM

## 2022-11-18 DIAGNOSIS — Z883 Allergy status to other anti-infective agents status: Secondary | ICD-10-CM

## 2022-11-18 DIAGNOSIS — K819 Cholecystitis, unspecified: Secondary | ICD-10-CM

## 2022-11-18 DIAGNOSIS — I10 Essential (primary) hypertension: Secondary | ICD-10-CM

## 2022-11-18 DIAGNOSIS — Z8249 Family history of ischemic heart disease and other diseases of the circulatory system: Secondary | ICD-10-CM

## 2022-11-18 DIAGNOSIS — E861 Hypovolemia: Secondary | ICD-10-CM | POA: Diagnosis present

## 2022-11-18 DIAGNOSIS — R9431 Abnormal electrocardiogram [ECG] [EKG]: Secondary | ICD-10-CM

## 2022-11-18 DIAGNOSIS — I959 Hypotension, unspecified: Secondary | ICD-10-CM | POA: Diagnosis not present

## 2022-11-18 DIAGNOSIS — K219 Gastro-esophageal reflux disease without esophagitis: Secondary | ICD-10-CM | POA: Diagnosis present

## 2022-11-18 DIAGNOSIS — G8929 Other chronic pain: Secondary | ICD-10-CM | POA: Diagnosis present

## 2022-11-18 DIAGNOSIS — K8012 Calculus of gallbladder with acute and chronic cholecystitis without obstruction: Secondary | ICD-10-CM | POA: Diagnosis not present

## 2022-11-18 DIAGNOSIS — E039 Hypothyroidism, unspecified: Secondary | ICD-10-CM | POA: Diagnosis present

## 2022-11-18 DIAGNOSIS — I4892 Unspecified atrial flutter: Secondary | ICD-10-CM | POA: Diagnosis present

## 2022-11-18 DIAGNOSIS — I4811 Longstanding persistent atrial fibrillation: Secondary | ICD-10-CM | POA: Diagnosis not present

## 2022-11-18 DIAGNOSIS — Z95810 Presence of automatic (implantable) cardiac defibrillator: Secondary | ICD-10-CM

## 2022-11-18 DIAGNOSIS — Z886 Allergy status to analgesic agent status: Secondary | ICD-10-CM

## 2022-11-18 DIAGNOSIS — R1013 Epigastric pain: Secondary | ICD-10-CM | POA: Diagnosis not present

## 2022-11-18 DIAGNOSIS — Z7984 Long term (current) use of oral hypoglycemic drugs: Secondary | ICD-10-CM

## 2022-11-18 DIAGNOSIS — Z951 Presence of aortocoronary bypass graft: Secondary | ICD-10-CM

## 2022-11-18 DIAGNOSIS — Z7901 Long term (current) use of anticoagulants: Secondary | ICD-10-CM

## 2022-11-18 DIAGNOSIS — F419 Anxiety disorder, unspecified: Secondary | ICD-10-CM | POA: Diagnosis present

## 2022-11-18 DIAGNOSIS — R011 Cardiac murmur, unspecified: Secondary | ICD-10-CM | POA: Diagnosis present

## 2022-11-18 DIAGNOSIS — I5030 Unspecified diastolic (congestive) heart failure: Secondary | ICD-10-CM

## 2022-11-18 DIAGNOSIS — R7989 Other specified abnormal findings of blood chemistry: Secondary | ICD-10-CM | POA: Diagnosis not present

## 2022-11-18 DIAGNOSIS — Z888 Allergy status to other drugs, medicaments and biological substances status: Secondary | ICD-10-CM

## 2022-11-18 DIAGNOSIS — Z7985 Long-term (current) use of injectable non-insulin antidiabetic drugs: Secondary | ICD-10-CM

## 2022-11-18 DIAGNOSIS — Z79899 Other long term (current) drug therapy: Secondary | ICD-10-CM

## 2022-11-18 DIAGNOSIS — G250 Essential tremor: Secondary | ICD-10-CM | POA: Diagnosis present

## 2022-11-18 DIAGNOSIS — E876 Hypokalemia: Secondary | ICD-10-CM | POA: Diagnosis present

## 2022-11-18 DIAGNOSIS — I25110 Atherosclerotic heart disease of native coronary artery with unstable angina pectoris: Secondary | ICD-10-CM | POA: Diagnosis present

## 2022-11-18 DIAGNOSIS — R0789 Other chest pain: Secondary | ICD-10-CM | POA: Diagnosis not present

## 2022-11-18 DIAGNOSIS — I130 Hypertensive heart and chronic kidney disease with heart failure and stage 1 through stage 4 chronic kidney disease, or unspecified chronic kidney disease: Secondary | ICD-10-CM | POA: Diagnosis present

## 2022-11-18 DIAGNOSIS — F32A Depression, unspecified: Secondary | ICD-10-CM | POA: Diagnosis present

## 2022-11-18 DIAGNOSIS — H409 Unspecified glaucoma: Secondary | ICD-10-CM | POA: Diagnosis present

## 2022-11-18 DIAGNOSIS — I5181 Takotsubo syndrome: Secondary | ICD-10-CM | POA: Diagnosis not present

## 2022-11-18 DIAGNOSIS — I493 Ventricular premature depolarization: Secondary | ICD-10-CM | POA: Diagnosis not present

## 2022-11-18 DIAGNOSIS — I700 Atherosclerosis of aorta: Secondary | ICD-10-CM | POA: Diagnosis present

## 2022-11-18 DIAGNOSIS — E119 Type 2 diabetes mellitus without complications: Secondary | ICD-10-CM | POA: Diagnosis present

## 2022-11-18 DIAGNOSIS — E785 Hyperlipidemia, unspecified: Secondary | ICD-10-CM | POA: Diagnosis present

## 2022-11-18 DIAGNOSIS — J4489 Other specified chronic obstructive pulmonary disease: Secondary | ICD-10-CM | POA: Diagnosis present

## 2022-11-18 DIAGNOSIS — Z66 Do not resuscitate: Secondary | ICD-10-CM | POA: Diagnosis present

## 2022-11-18 DIAGNOSIS — N179 Acute kidney failure, unspecified: Secondary | ICD-10-CM | POA: Diagnosis not present

## 2022-11-18 DIAGNOSIS — Z9851 Tubal ligation status: Secondary | ICD-10-CM

## 2022-11-18 DIAGNOSIS — K8020 Calculus of gallbladder without cholecystitis without obstruction: Secondary | ICD-10-CM

## 2022-11-18 DIAGNOSIS — I442 Atrioventricular block, complete: Secondary | ICD-10-CM | POA: Diagnosis present

## 2022-11-18 DIAGNOSIS — M1990 Unspecified osteoarthritis, unspecified site: Secondary | ICD-10-CM | POA: Diagnosis present

## 2022-11-18 DIAGNOSIS — Z794 Long term (current) use of insulin: Secondary | ICD-10-CM

## 2022-11-18 DIAGNOSIS — I5043 Acute on chronic combined systolic (congestive) and diastolic (congestive) heart failure: Secondary | ICD-10-CM | POA: Diagnosis present

## 2022-11-18 DIAGNOSIS — Z885 Allergy status to narcotic agent status: Secondary | ICD-10-CM

## 2022-11-18 DIAGNOSIS — I472 Ventricular tachycardia, unspecified: Secondary | ICD-10-CM | POA: Diagnosis not present

## 2022-11-18 HISTORY — DX: Congenital malformation of heart, unspecified: Q24.9

## 2022-11-18 HISTORY — DX: Longstanding persistent atrial fibrillation: I48.11

## 2022-11-18 HISTORY — DX: Chronic diastolic (congestive) heart failure: I50.32

## 2022-11-18 LAB — HEPATIC FUNCTION PANEL
ALT: 20 U/L (ref 0–44)
AST: 44 U/L — ABNORMAL HIGH (ref 15–41)
Albumin: 3.4 g/dL — ABNORMAL LOW (ref 3.5–5.0)
Alkaline Phosphatase: 46 U/L (ref 38–126)
Bilirubin, Direct: 0.5 mg/dL — ABNORMAL HIGH (ref 0.0–0.2)
Indirect Bilirubin: 0.4 mg/dL (ref 0.3–0.9)
Total Bilirubin: 0.9 mg/dL (ref 0.3–1.2)
Total Protein: 6.8 g/dL (ref 6.5–8.1)

## 2022-11-18 LAB — URINALYSIS, ROUTINE W REFLEX MICROSCOPIC
Bacteria, UA: NONE SEEN
Bilirubin Urine: NEGATIVE
Glucose, UA: >=500 mg/dL — AB
Hgb urine dipstick: NEGATIVE
Ketones, ur: NEGATIVE mg/dL
Leukocytes,Ua: NEGATIVE
Nitrite: NEGATIVE
Protein, ur: NEGATIVE mg/dL
Specific Gravity, Urine: >1.046 — ABNORMAL HIGH (ref 1.005–1.030)
pH: 5 (ref 5.0–8.0)

## 2022-11-18 LAB — CBC
HCT: 41.6 % (ref 36.0–46.0)
Hemoglobin: 13.7 g/dL (ref 12.0–15.0)
MCH: 27.9 pg (ref 26.0–34.0)
MCHC: 32.9 g/dL (ref 30.0–36.0)
MCV: 84.7 fL (ref 80.0–100.0)
Platelets: 295 10*3/uL (ref 150–400)
RBC: 4.91 MIL/uL (ref 3.87–5.11)
RDW: 13.9 % (ref 11.5–15.5)
WBC: 8.9 10*3/uL (ref 4.0–10.5)
nRBC: 0 % (ref 0.0–0.2)

## 2022-11-18 LAB — CBG MONITORING, ED: Glucose-Capillary: 133 mg/dL — ABNORMAL HIGH (ref 70–99)

## 2022-11-18 LAB — BASIC METABOLIC PANEL
Anion gap: 12 (ref 5–15)
BUN: 12 mg/dL (ref 8–23)
CO2: 23 mmol/L (ref 22–32)
Calcium: 8.6 mg/dL — ABNORMAL LOW (ref 8.9–10.3)
Chloride: 102 mmol/L (ref 98–111)
Creatinine, Ser: 0.82 mg/dL (ref 0.44–1.00)
GFR, Estimated: >60 mL/min (ref >60)
Glucose, Bld: 182 mg/dL — ABNORMAL HIGH (ref 70–99)
Potassium: 4.6 mmol/L (ref 3.5–5.1)
Sodium: 137 mmol/L (ref 135–145)

## 2022-11-18 LAB — TROPONIN I (HIGH SENSITIVITY)
Troponin I (High Sensitivity): 13 ng/L (ref <18)
Troponin I (High Sensitivity): 84 ng/L — ABNORMAL HIGH (ref <18)
Troponin I (High Sensitivity): 102 ng/L (ref <18)
Troponin I (High Sensitivity): 93 ng/L — ABNORMAL HIGH (ref <18)

## 2022-11-18 LAB — GLUCOSE, CAPILLARY
Glucose-Capillary: 134 mg/dL — ABNORMAL HIGH (ref 70–99)
Glucose-Capillary: 156 mg/dL — ABNORMAL HIGH (ref 70–99)
Glucose-Capillary: 262 mg/dL — ABNORMAL HIGH (ref 70–99)

## 2022-11-18 LAB — LIPASE, BLOOD: Lipase: 23 U/L (ref 11–51)

## 2022-11-18 MED ORDER — ROSUVASTATIN CALCIUM 20 MG PO TABS
20.0000 mg | ORAL_TABLET | Freq: Every day | ORAL | Status: DC
  Administered 2022-11-18 – 2022-11-25 (×7): 20 mg via ORAL
  Filled 2022-11-18 (×7): qty 1

## 2022-11-18 MED ORDER — MORPHINE SULFATE (PF) 4 MG/ML IV SOLN
4.0000 mg | INTRAVENOUS | Status: DC | PRN
  Administered 2022-11-18: 4 mg via INTRAVENOUS
  Filled 2022-11-18: qty 1

## 2022-11-18 MED ORDER — MORPHINE SULFATE (PF) 4 MG/ML IV SOLN
4.0000 mg | Freq: Once | INTRAVENOUS | Status: AC
  Administered 2022-11-18: 4 mg via INTRAVENOUS
  Filled 2022-11-18: qty 1

## 2022-11-18 MED ORDER — CITALOPRAM HYDROBROMIDE 20 MG PO TABS
20.0000 mg | ORAL_TABLET | Freq: Every morning | ORAL | Status: DC
  Administered 2022-11-20 – 2022-11-21 (×2): 20 mg via ORAL
  Filled 2022-11-18 (×2): qty 1

## 2022-11-18 MED ORDER — HYDROMORPHONE HCL 1 MG/ML IJ SOLN
0.5000 mg | INTRAMUSCULAR | Status: DC | PRN
  Administered 2022-11-19 (×2): 0.5 mg via INTRAVENOUS
  Filled 2022-11-18 (×2): qty 0.5

## 2022-11-18 MED ORDER — ACETAMINOPHEN 325 MG PO TABS
650.0000 mg | ORAL_TABLET | Freq: Four times a day (QID) | ORAL | Status: DC
  Administered 2022-11-18 – 2022-11-25 (×27): 650 mg via ORAL
  Filled 2022-11-18 (×28): qty 2

## 2022-11-18 MED ORDER — LATANOPROST 0.005 % OP SOLN
1.0000 [drp] | Freq: Every day | OPHTHALMIC | Status: DC
  Administered 2022-11-18 – 2022-11-24 (×7): 1 [drp] via OPHTHALMIC
  Filled 2022-11-18: qty 2.5

## 2022-11-18 MED ORDER — IOHEXOL 350 MG/ML SOLN
75.0000 mL | Freq: Once | INTRAVENOUS | Status: AC | PRN
  Administered 2022-11-18: 75 mL via INTRAVENOUS

## 2022-11-18 MED ORDER — LEVOTHYROXINE SODIUM 112 MCG PO TABS
112.0000 ug | ORAL_TABLET | Freq: Every day | ORAL | Status: DC
  Administered 2022-11-19 – 2022-11-24 (×5): 112 ug via ORAL
  Filled 2022-11-18 (×5): qty 1

## 2022-11-18 MED ORDER — SODIUM CHLORIDE 0.9 % IV SOLN
2.0000 g | INTRAVENOUS | Status: DC
  Administered 2022-11-18 – 2022-11-19 (×2): 2 g via INTRAVENOUS
  Filled 2022-11-18 (×2): qty 20

## 2022-11-18 MED ORDER — ONDANSETRON HCL 4 MG/2ML IJ SOLN
4.0000 mg | Freq: Once | INTRAMUSCULAR | Status: AC
  Administered 2022-11-18: 4 mg via INTRAVENOUS
  Filled 2022-11-18: qty 2

## 2022-11-18 MED ORDER — INSULIN ASPART 100 UNIT/ML IJ SOLN
0.0000 [IU] | Freq: Three times a day (TID) | INTRAMUSCULAR | Status: DC
  Administered 2022-11-18: 1 [IU] via SUBCUTANEOUS
  Administered 2022-11-18: 2 [IU] via SUBCUTANEOUS
  Administered 2022-11-19: 1 [IU] via SUBCUTANEOUS
  Administered 2022-11-19: 3 [IU] via SUBCUTANEOUS

## 2022-11-18 MED ORDER — FLUTICASONE FUROATE-VILANTEROL 100-25 MCG/ACT IN AEPB
1.0000 | INHALATION_SPRAY | Freq: Every day | RESPIRATORY_TRACT | Status: DC
  Administered 2022-11-20 – 2022-11-25 (×4): 1 via RESPIRATORY_TRACT
  Filled 2022-11-18 (×3): qty 28

## 2022-11-18 NOTE — ED Notes (Signed)
MD notified of critical troponin 102

## 2022-11-18 NOTE — Consult Note (Addendum)
Cardiology Consultation   Patient ID: Mercedes Barr MRN: [id number]; DOB: October 14, 1945  Admit date: 11/18/2022 Date of Consult: 11/18/2022  PCP:  Evelena Leyden, DO   [facility] Providers Cardiologist:  Thurmon Fair, MD  Electrophysiologist:  Thurmon Fair, MD       Patient Profile:   Mercedes Barr is a 77 y.o. female with a hx of atrioventricular cushion defect repair age 62 complicated by CHB s/p pacemaker with revision of system in 1999 with last gen change 2018 ([employer]), longstanding persistent afib/flutter on Xarelto, arthritis, asthma, COPD, chronic HFpEF, DM, HTN, hypothyroidism, GERD who is being seen 11/18/2022 for pre-operative evaluation at the request of Dr. Dwain Sarna.  History of Present Illness:   Ms. Lieber carries the above PMH and follows with Dr. Royann Shivers. She has a nurse entry into her chart of CAD s/p CABG 1988 but per Dr. Erin Hearing notes she had prior atrioventricular cushion defect repair age 79 complicated by CHB s/p pacemaker with revision of system in 1999 with last gen change 2018. She had a cath in 2011 showing normal coronaries. She has not required ischemic eval since then. She does have coronary calcification and scattered atheromatous calcification of the aorta on CT this admission. Last echo 03/2021 EF 55-60%, indeterminate diastolic function, normal RV, moderate LAE. She has longstanding persistent afib felt likely permanent per last note, anticoagulated with Xarelto.    She presented to the ED with abdominal pain, chest pain, nausea and vomiting. She reports at baseline she is generally sedentary. She had a normal meal yesterday at 6pm. In the evening she reports she was vacuuming and felt more SOB than usual. This was a bit challenging to tease out as she initially denied any recent SOB. She noticed this began to aggravate some abdominal/chest pain. Around 10pm the symptoms really worsened and she had some nausea and vomiting. The pain  persisted unrelenting, unable to sleep so she came to the ED after 2am. She got morphine which she states brought down the pain but did not fully resolve this. She continues to have diffuse abdominal pain worse in the RUQ as well as significant chest wall tenderness which she states is the pain that brought her in. Her SOB has resolved. She otherwise had not recently been having anginal-type chest pain. Labs showed hsTroponin 13->84->102->93, CBC wnl, Cr wnl, AST 44, ALT wnl, UA >500 glucose with elevated specific gravity. CXR showed NAD. CT abd/pelvis showed cholelithasis without signs of acute cholecystitis, + atherosclerosis, evidence of left ventricular infarction (thinning of the left ventricular apex). RUQ US showed cholelithiasis, equivocal for cholecystitis, + RUQ tenderness and borderline gallbladder wall thickening. History and exam were felt more consistent with acute cholecystitis so she was seen by general surgery with consideration of lap chole tomorrow. Xarelto is on hold. EKG shows known coarse afib/flutter with V pacing.  Past Medical History:  Diagnosis Date   Arthritis 11/06/2012   Tr TKR   Asthma    Chronic heart failure with preserved ejection fraction (HFpEF)    Complete heart block    Congenital heart disease    atrioventricular cushion defect repair age 81 (1988)   COPD (chronic obstructive pulmonary disease)    Diabetes mellitus    GERD (gastroesophageal reflux disease)    Heart murmur    Hypertension    Hypothyroidism    Longstanding persistent atrial fibrillation    Normal coronary arteries 08/08/2009   Pacemaker    PACEMAKER DEPENDENT-[facility]U [facility]  OFFICE NOTE DR. A. LITTLE STATES  "EXTREMELY PACEMAKER SENSITIVE AND WHEN YOU TRY TO CHECK FOR UNDERLYING RHYTHMS SHE WILL HAVE SNYCOPE AND WE HAVE NOT DONE THIS NOW IN ABOUT 2 YEARS".   Shortness of breath dyspnea    walking distance or climbing stairs    Past Surgical History:   Procedure Laterality Date   ATRIOVENTRICULAR CUSHION DEFECT REPAIR  1988   CARDIAC CATHETERIZATION  2011    SHOWED NO CAD-PER CARDIOLOGY OFFICE NOTES DR. A. LITTLE   colonscopy      removed polyps    INSERT / REPLACE / REMOVE PACEMAKER     PACEMAKER INSERTION  1988   last gen 11/08- MDT   PPM GENERATOR CHANGEOUT N/A 02/15/2017   Procedure: PPM Generator Changeout;  Surgeon: Thurmon Fair, MD;  Location: [facility];  Service: Cardiovascular;  Laterality: N/A;   TOTAL KNEE ARTHROPLASTY Right 11/19/2012   Procedure: RIGHT TOTAL KNEE ARTHROPLASTY;  Surgeon: Loanne Drilling, MD;  Location: [facility];  Service: Orthopedics;  Laterality: Right;   TOTAL KNEE REVISION Right 09/24/2014   Procedure: RIGHT TOTAL KNEE ARTHROPLASTY REVISION;  Surgeon: Loanne Drilling, MD;  Location: [facility];  Service: Orthopedics;  Laterality: Right;   TUBAL LIGATION       Home Medications:  Prior to Admission medications   Medication Sig Start Date End Date Taking? Authorizing Provider  albuterol (VENTOLIN HFA) 108 (90 Base) MCG/ACT inhaler INHALE 2 PUFFS EVERY 4 HOURS AS NEEDED FOR WHEEZING 08/27/21   Lilland, Alana, DO  Blood Glucose Monitoring Suppl (ONE TOUCH ULTRA 2) w/Device KIT Use to check blood sugar as directed. 08/31/18   Westley Chandler, MD  cetirizine (ZYRTEC ALLERGY) 10 MG tablet Take 1 tablet (10 mg total) by mouth daily. 05/10/22   Lilland, Alana, DO  citalopram (CELEXA) 20 MG tablet TAKE 1 TABLET BY MOUTH IN THE  MORNING 09/08/22   Lilland, Alana, DO  diclofenac Sodium (VOLTAREN ARTHRITIS PAIN) 1 % GEL Apply 2 g topically 4 (four) times daily. 09/07/22   Lilland, Alana, DO  Dulaglutide (TRULICITY) 0.75 MG/0.5ML SOPN Inject 0.75 mg into the skin once a week. 09/07/22   Lilland, Alana, DO  empagliflozin (JARDIANCE) 10 MG TABS tablet TAKE 1 TABLET BY MOUTH EVERY DAY BEFORE BREAKFAST 10/28/22   Fayette Pho, MD  empagliflozin (JARDIANCE) 25 MG TABS tablet Take 1 tablet (25 mg total) by mouth daily.  08/22/22   Lilland, Alana, DO  EPIPEN 2-PAK 0.3 MG/0.3ML SOAJ injection Inject 0.3 mLs (0.3 mg  total) into the muscle as  needed for severe allergic  reaction, and contact  medical provider. 10/27/15   Jamal Collin, MD  famotidine (PEPCID) 20 MG tablet TAKE 1 TABLET BY MOUTH TWICE A DAY 05/19/22   Lilland, Alana, DO  fluticasone (FLOVENT HFA) 110 MCG/ACT inhaler USE 1 INHALATION BY MOUTH TWICE  DAILY 10/28/22   Idalia Needle, Turkey J, DO  furosemide (LASIX) 20 MG tablet TAKE 1 TABLET BY MOUTH IN THE  MORNING 10/07/22   Lilland, Alana, DO  glucose blood (ONE TOUCH ULTRA TEST) test strip Use for blood sugar testing 1-3 times daily 08/15/19   Mirian Mo, MD  guaifenesin (ROBITUSSIN) 100 MG/5ML syrup TAKE 10 ML BY MOUTH 4 TIMES A DAY AS NEEDED FOR COUGH 08/17/22   Lilland, Alana, DO  HYDROcodone-acetaminophen (NORCO/VICODIN) 5-325 MG tablet Take half a tablet at night as needed for pain. 11/04/22   Alfredo Martinez, MD  insulin lispro (HUMALOG KWIKPEN) 100 UNIT/ML KwikPen Inject  12 Units into the skin 2 (two) times daily before a meal. 09/30/22   Lilland, Alana, DO  Insulin Pen Needle (BD PEN NEEDLE NANO U/F) 32G X 4 MM MISC USE AS DIRECTED WITH INSULIN 4  TIMES DAILY 09/21/22   Lilland, Alana, DO  Insulin Pen Needle 31G X 4 MM MISC 1 each by Does not apply route daily. 01/11/22   Evelena Leyden, DO  Lancets MISC Use with blood sugar monitor to check daily 07/07/21   Westley Chandler, MD  LANTUS SOLOSTAR 100 UNIT/ML Solostar Pen INJECT SUBCUTANEOUSLY 40 UNITS  DAILY 08/15/22   Lilland, Alana, DO  levothyroxine (SYNTHROID) 112 MCG tablet TAKE 1 TABLET (112 MCG TOTAL) BY MOUTH EVERY MORNING. 30 MINUTES BEFORE FOOD 05/23/22   Lilland, Alana, DO  losartan (COZAAR) 25 MG tablet TAKE 1 TABLET BY MOUTH DAILY 10/07/22   Lilland, Alana, DO  Omega-3 Fatty Acids (FISH OIL) 1000 MG CAPS Take 1 capsule by mouth every morning. Reported on 02/11/2016 Patient not taking: Reported on 09/27/2022    [provider]  omeprazole  (PRILOSEC) 20 MG capsule TAKE 1 CAPSULE BY MOUTH  TWICE DAILY 02/01/21   Mirian Mo, MD  polyethylene glycol powder (GLYCOLAX/MIRALAX) powder TAKE 17 GRAMS BY MOUTH 2  TIMES DAILY AS NEEDED FOR  LAXATIVE 10/23/17   Diallo, Abdoulaye, MD  potassium chloride SA (KLOR-CON M) 20 MEQ tablet Take 2 tablets (40 mEq total) by mouth daily. 06/01/22   Graciella Freer, PA-C  PRODIGY NO CODING BLOOD GLUC test strip USE THREE TIMES A DAY TO CHECK ON BLOOD GLUCOSE LEVEL 10/02/20   Mirian Mo, MD  propranolol (INDERAL) 40 MG tablet TAKE 1 TABLET BY MOUTH DAILY 10/07/22   Lilland, Alana, DO  rivaroxaban (XARELTO) 20 MG TABS tablet TAKE 1 TABLET BY MOUTH DAILY WITH SUPPER 10/28/22   Fayette Pho, MD  rosuvastatin (CRESTOR) 20 MG tablet TAKE 1 TABLET BY MOUTH EVERY DAY 02/24/22   Lilland, Alana, DO  Spacer/Aero-Holding Chambers (AEROCHAMBER PLUS) inhaler Use as instructed 06/08/18   Domenick Gong, MD  SYMBICORT 80-4.5 MCG/ACT inhaler INHALE 2 PUFFS BY MOUTH INTO THE LUNGS TWICE A DAY 10/28/22   Fayette Pho, MD  Travoprost, BAK Free, (TRAVATAN) 0.004 % SOLN ophthalmic solution Place 1 drop into both eyes at bedtime. 09/15/14   Leona Singleton, MD  albuterol (PROVENTIL,VENTOLIN) 90 MCG/ACT inhaler Inhale 2 puffs into the lungs every 4 (four) hours as needed for wheezing or shortness of breath. 05/30/11 10/21/11  Ardyth Gal, MD    Inpatient Medications: Scheduled Meds:  acetaminophen  650 mg Oral Q6H   [START ON 11/19/2022] citalopram  20 mg Oral q morning   fluticasone furoate-vilanterol  1 puff Inhalation Daily   insulin aspart  0-9 Units Subcutaneous TID WC   latanoprost  1 drop Both Eyes QHS   [START ON 11/19/2022] levothyroxine  112 mcg Oral BH-q7a   rosuvastatin  20 mg Oral Daily   Continuous Infusions:  cefTRIAXone (ROCEPHIN)  IV Stopped (11/18/22 1112)   PRN Meds: HYDROmorphone (DILAUDID) injection  Allergies:    Allergies  Allergen Reactions   Chlorhexidine Itching and  Rash   Naproxen Itching and Swelling   Pregabalin Nausea And Vomiting and Itching   Statins Other (See Comments)    Leg pains PT IS ABLE TO TOLERATE CRESTOR   Oxycodone Itching   Amitriptyline Hcl Itching and Rash   Benazepril Hcl Itching and Rash   Gabapentin Itching   Oxycodone-Acetaminophen Itching   Propoxyphene Itching  Social History:   Social History   Socioeconomic History   Marital status: Divorced    Spouse name: Not on file   Number of children: 3   Years of education: 12   Highest education level: High school graduate  Occupational History   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    Patient enjoys walking around her neighborhood with her 2 neighbors.    Patient does not drive, however she takes SCAT to all her medical apts.    Social Determinants of Health   Financial Resource Strain: Low Risk  (09/27/2022)   Overall Financial Resource Strain (CARDIA)    Difficulty of Paying Living Expenses: Not hard at all  Food Insecurity: No Food Insecurity (11/18/2022)   Hunger Vital Sign    Worried About Running Out of Food in the Last Year: Never true    Ran Out of Food in the Last Year: Never true  Transportation Needs: No Transportation Needs (11/18/2022)   PRAPARE - Administrator, Civil Service (Medical): No    Lack of Transportation (Non-Medical): No  Physical Activity: Insufficiently Active (09/27/2022)   Exercise Vital Sign    Days of Exercise per Week: 2 days    Minutes of Exercise per Session: 10 min  Stress: No Stress Concern Present (09/27/2022)   [employer] of Occupational  Health - Occupational Stress Questionnaire    Feeling of Stress : Not at all  Social Connections: Moderately Isolated (09/27/2022)   Social Connection and Isolation Panel [NHANES]    Frequency of Communication with Friends and Family: More than three times a week    Frequency of Social Gatherings with Friends and Family: More than three times a week    Attends Religious Services: 1 to 4 times per year    Active Member of [employer] or Organizations: No    Attends Banker Meetings: Never    Marital Status: Divorced  Catering manager Violence: Not At Risk (11/18/2022)   Humiliation, Afraid, Rape, and Kick questionnaire    Fear of Current or Ex-Partner: No    Emotionally Abused: No    Physically Abused: No    Sexually Abused: No    Family History:    Family History  Problem Relation Age of Onset   Heart failure Mother    Hypertension Mother    Stroke Father    Heart failure Sister    Kidney disease Daughter    Cancer Brother    Breast cancer Neg Hx      ROS:  Please see the history of present illness.   All other ROS reviewed and negative.     Physical Exam/Data:   Vitals:   11/18/22 1300 11/18/22 1315 11/18/22 1400 11/18/22 1402  BP: 130/84 102/63 135/60 135/60  Pulse:   61 63  Resp: (!) 21 (!) 21  18  Temp:    97.8 F (36.6 C)  TempSrc:    Oral  SpO2:   94% 98%  Weight:    64.6 kg  Height:     (  1.575 m)    Intake/Output Summary (Last 24 hours) at 11/18/2022 1650 Last data filed at 11/18/2022 1605 Gross per 24 hour  Intake 341.09 ml  Output 50 ml  Net 291.09 ml      11/18/2022    2:02 PM 11/18/2022    2:40 AM 11/02/2022    8:25 AM  Last 3 Weights  Weight (lbs) 142 lb 6.7 oz 145 lb 141 lb  Weight (kg) 64.6 kg 65.772 kg 63.957 kg     Body mass index is 26.05 kg/m.  General: Well developed, well nourished AAF in no acute distress. Head: Normocephalic, atraumatic, sclera non-icteric, no xanthomas, nares are without discharge. Neck: Negative for  carotid bruits. JVP not elevated. Lungs: Clear bilaterally to auscultation without wheezes, rales, or rhonchi. Breathing is unlabored. Heart: RRR S1 S2 without murmurs, rubs, or gallops. PPM L upper chest wall Abdomen: Soft, non-tender, non-distended with normoactive bowel sounds. No rebound/guarding. Extremities: No clubbing or cyanosis. No edema. Distal pedal pulses are 2+ and equal bilaterally. Neuro: Alert and oriented X 3. Moves all extremities spontaneously. Psych:  Responds to questions appropriately with a normal affect.   EKG:  The EKG was personally reviewed and demonstrates:  coarse afib/flutter 61bpm, V paced rhythm, nonspecific TW changes  Telemetry:  Telemetry was personally reviewed and demonstrates:  afib/flutter V pacing  Relevant CV Studies:  2d echo 03/2021   1. Left ventricular ejection fraction, by estimation, is 55 to 60%. The  left ventricle has normal function. The left ventricle has no regional  wall motion abnormalities. Left ventricular diastolic parameters are  indeterminate. Elevated left ventricular  end-diastolic pressure. The E/e' is 20.   2. Right ventricular systolic function is normal. The right ventricular  size is normal. There is normal pulmonary artery systolic pressure. The  estimated right ventricular systolic pressure is 29.4 mmHg.   3. Left atrial size was moderately dilated.   4. The mitral valve is grossly normal. Trivial mitral valve  regurgitation.   5. The aortic valve is tricuspid. Aortic valve regurgitation is not  visualized.   6. The inferior vena cava is normal in size with greater than 50%  respiratory variability, suggesting right atrial pressure of 3 mmHg.   Comparison(s): 10/24/12 EF 55-60%.   Laboratory Data:  High Sensitivity Troponin:   Recent Labs  Lab 11/18/22 0245 11/18/22 0536 11/18/22 0733 11/18/22 1017  TROPONINIHS 13 84* 102* 93*     Chemistry Recent Labs  Lab 11/18/22 0245  NA 137  K 4.6  CL 102   CO2 23  GLUCOSE 182*  BUN 12  CREATININE 0.82  CALCIUM 8.6*  GFRNONAA >60  ANIONGAP 12    Recent Labs  Lab 11/18/22 0355  PROT 6.8  ALBUMIN 3.4*  AST 44*  ALT 20  ALKPHOS 46  BILITOT 0.9   Lipids No results for input(s): "CHOL", "TRIG", "HDL", "LABVLDL", "LDLCALC", "CHOLHDL" in the last 168 hours.  Hematology Recent Labs  Lab 11/18/22 0245  WBC 8.9  RBC 4.91  HGB 13.7  HCT 41.6  MCV 84.7  MCH 27.9  MCHC 32.9  RDW 13.9  PLT 295   Thyroid No results for input(s): "TSH", "FREET4" in the last 168 hours.  BNPNo results for input(s): "BNP", "PROBNP" in the last 168 hours.  DDimer No results for input(s): "DDIMER" in the last 168 hours.   Radiology/Studies:  US Abdomen Limited RUQ (LIVER/GB)  Result Date: 11/18/2022 CLINICAL DATA:  Right upper quadrant abdominal pain EXAM: ULTRASOUND ABDOMEN LIMITED RIGHT  UPPER QUADRANT COMPARISON:  CT from earlier today FINDINGS: Gallbladder: Few gallstones. There is gallbladder region tenderness and borderline wall thickening to 4 mm. No pericholecystic fluid. Common bile duct: Diameter: 6 mm Liver: Echogenic liver with pericholecystic sparing. Portal vein is patent on color Doppler imaging with normal direction of blood flow towards the liver. IMPRESSION: Cholelithiasis. Equivocal for cholecystitis, there is right upper quadrant tenderness and borderline gallbladder wall thickening but no pericholecystic edema by CT or ultrasound. Electronically Signed   By: Tiburcio Pea M.D.   On: 11/18/2022 07:05   CT ABDOMEN PELVIS W CONTRAST  Result Date: 11/18/2022 CLINICAL DATA:  Acute, nonlocalized abdominal pain, right upper quadrant. Tenderness to palpation. EXAM: CT ABDOMEN AND PELVIS WITH CONTRAST TECHNIQUE: Multidetector CT imaging of the abdomen and pelvis was performed using the standard protocol following bolus administration of intravenous contrast. RADIATION DOSE REDUCTION: This exam was performed according to the departmental  dose-optimization program which includes automated exposure control, adjustment of the mA and/or kV according to patient size and/or use of iterative reconstruction technique. CONTRAST:  75mL OMNIPAQUE IOHEXOL 350 MG/ML SOLN COMPARISON:  02/01/2013 FINDINGS: Lower chest: Biventricular pacer. Thinning of the left ventricular apex suggesting prior infarction. Coronary calcification. Hepatobiliary: No focal liver abnormality.Cholelithiasis. No evidence of acute cholecystitis. No biliary dilatation. Pancreas: Unremarkable. Spleen: Unremarkable. Adrenals/Urinary Tract: Negative adrenals. No hydronephrosis or stone. Mild renal cortical scarring bilaterally. Unremarkable bladder. Stomach/Bowel:  No obstruction. No appendicitis. Vascular/Lymphatic: No acute vascular abnormality. Scattered atheromatous calcification of the aorta. No mass or adenopathy. Reproductive:Posterior uterine fibroid measuring nearly 4 cm. Other: No ascites or pneumoperitoneum. Musculoskeletal: No acute abnormalities. Generalized lower thoracic and lumbar spine degeneration. IMPRESSION: 1. No acute finding. 2. Cholelithiasis without signs of acute cholecystitis. 3. Atherosclerosis.  Evidence of prior left ventricular infarct. Electronically Signed   By: Tiburcio Pea M.D.   On: 11/18/2022 05:49   DG Chest 2 View  Result Date: 11/18/2022 CLINICAL DATA:  Chest pain for 2 days, initial encounter EXAM: CHEST - 2 VIEW COMPARISON:  11/09/2022 FINDINGS: Cardiac shadow is stable. Pacing device is again seen and stable. The lungs are clear. Visualized upper abdomen and bony structures are within normal limits. IMPRESSION: No acute abnormality noted. Electronically Signed   By: Alcide Clever M.D.   On: 11/18/2022 03:54   CUP PACEART REMOTE DEVICE CHECK  Result Date: 11/16/2022 Scheduled remote reviewed. Normal device function.  Known AF, Xarelto per EPIC Next remote 91 days. LA, CVRS    Assessment and Plan:   1. Abdominal pain, chest pain,  elevated troponin, pre-operative evaluation - symptoms sound GI in nature though some features are difficult to tease out - had increased DOE prior to onset of symptoms followed by persistent chest discomfort though chest wall is significantly tender to minimal palpation - hsTroponins are elevated with rise and fall but relatively low despite persistence of symptoms, peaking at 102 - EKG shows known AF with paced rhythm so not particularly helpful - CT shows coronary/aortic calcification as well as thinning of the LV  - will await further evaluation with MD  2. Longstanding persistent coarse afib/flutter - Xarelto on hold, last dose 4/11 AM per reports - per d/w Dr. Allyson Sabal, no need to bridge at this time with heparin, hold pre-operatively (if prolonged period off anticoagulation is needed due to delays, would need to reconsider) - home med rec otherwise pending - was on short acting propranolol 40mg  just once daily appears to be remotely prescribed for essential tremor, can resume when taking  orals   3. Chronic HFpEF - appears euvolemic, Jardiance/Lasix on hold by primary team  4. H/o atrioventricular cushion defect repair age 81 complicated by CHB with PPM placement with last gen change 2018 - continue OP f/u     Risk Assessment/Risk Scores:     TIMI Risk Score for Unstable Angina or Non-ST Elevation MI:   The patient's TIMI risk score is 3, which indicates a 13% risk of all cause mortality, new or recurrent myocardial infarction or need for urgent revascularization in the next 14 days.  New York Heart Association (NYHA) Functional Class NYHA Class II with III yesterday   CHA2DS2-VASc Score = 6   This indicates a 9.7% annual risk of stroke. The patient's score is based upon: CHF History: 1 HTN History: 1 Diabetes History: 1 Stroke History: 0 Vascular Disease History: 0 Age Score: 2 Gender Score: 1      For questions or updates, please contact [facility] Please  consult [email or web address] for contact info under    Signed, Laurann Montana, PA-C  11/18/2022 4:50 PM  Agree with findings by Ronie Spies PA-C  Asked to see Ms. Pennell for preoperative clearance before laparoscopic cholecystectomy.  She is a patient of Dr. Erin Hearing.  She has had surgery on her heart remotely but it was not CABG.  She had a pacemaker followed by Dr. Dr. Royann Shivers since that time.  She is in chronic A-fib on Xarelto oral anticoagulation.  Other problems as outlined.  She is was Back in 2011 and had normal coronary arteries.  She does have coronary calcification on chest CT.  She was admitted with abdominal pain and atypical chest pain.  Her LFTs are not elevated.  She does have cholelithiasis on imaging and significant right upper quadrant tenderness.  She also has tenderness of her sternum on palpation.  Her troponins were mildly elevated in the 90-100 range.  EKG shows that she paced rhythm.  I do not think her chest pain is ischemically mediated.  She is cleared for her laparoscopic cholecystectomy tomorrow at low risk.  Hold Xarelto as outlined and restart when safe from surgical point of view.  Will see her once postop.  Runell Gess, M.D., FACP, [facility], [facility] [address] [place], [place]  [address]  [phone] 11/18/2022 5:25 PM

## 2022-11-18 NOTE — Progress Notes (Signed)
FMTS Brief Progress Note  S:Visited at bedside with Dr. Melissa Noon for night rounds.    O: BP 110/63 (BP Location: Right Arm)   Pulse (!) 59   Temp 98.7 F (37.1 C) (Oral)   Resp 19   Ht 5\' 2"  (1.575 m)   Wt 64.6 kg   SpO2 93%   BMI 26.05 kg/m     A/P: Acute Cholecystitis complicated by complete heart block with pacemaker   - Orders reviewed. Labs for AM ordered, which was adjusted as needed.  - If condition changes, plan includes ***.   [name], MD 11/18/2022, 11:17 PM PGY-1, [facility] Night Resident  Please page [phone] with questions.

## 2022-11-18 NOTE — ED Provider Notes (Signed)
Accepted handoff at shift change from [facility], PA-C. Please see prior provider note for more detail.   Briefly: Patient is 77 y.o. with heart block status pacemaker, A-fib on Xarelto presenting for epigastric right upper quadrant pain.  With positive Murphy sign.  Ultrasound showing gallstones.  Serial troponins also trending up.  Cardiology consulted and advised that likely is demand ischemia.   DDX: concern for cholecystitis, acute cholangitis, cholelithiasis, pancreatitis  Plan: Admit to medicine, surgery will see in consult for potential cholecystectomy.  Continue to trend troponins.  Cardiology also following.   Physical Exam  BP 133/68   Pulse 60   Temp 97.9 F (36.6 C) (Oral)   Resp (!) 23   Ht 5\' 2"  (1.575 m)   Wt 65.8 kg   SpO2 93%   BMI 26.52 kg/m   Physical Exam  Procedures  Procedures  ED Course / MDM   Clinical Course as of 11/18/22 0755  Fri Nov 18, 2022  0703 Shari Prows -  Xarelto last yesterday AM [WF]  0715 77 yo female w/ heart block s/p pacemaker, a fib on A/C, presenting with epigastric and RUQ abdominal pain acute onset last night around 10 pm, has had similar attacks in the past but never this persistent or severe.  Constant pain all night.  Nausea, no vomiting.  On exam has focal sig RUQ tenderness, +Murphy sign.  CT and abdominal U/S showing gallstones, borderline wall thickening, CBD wnl.  LFT's and lipase wnl.  Trop 13 -> 84.  ECG showing V paced rhythm per my interpretation.  Plan for surgery consult for suspected cholecystitis.  PA overnight spoke to cardiologist Dr Shari Prows regarding mild trop increase, likely demand ischemia, planning to trend troponin levels.  Pt's pain improved on my assessment now after 3 rounds of IV morphine, but still present.  Daughter is present at bedside for discussion. [MT]  [phone] Spoke to Dr. Landry Dyke general surgery who advised to admit with medicine team.  Surgery will see patient in consult given the patient is on  Xarelto and took her last dose yesterday. [JR]    Clinical Course User Index [JR] Gareth Eagle, PA-C [MT] Renaye Rakers Kermit Balo, MD [WF] Gailen Shelter, [place]   Medical Decision Making Amount and/or Complexity of Data Reviewed Labs: ordered. Radiology: ordered.  Risk Prescription drug management.   Admitted to family medicine team for cholecystitis workup.  Also spoke to Dr. Tresa Endo of surgery who advised that they will see her in consult.  Cardiology is also planning to follow her given her troponins continue to trend upward.  The believe it is likely due to demand ischemia but will also be seeing her in consult.       Gareth Eagle, PA-C 11/18/22 0830    Terald Sleeper, MD 11/18/22 1159

## 2022-11-18 NOTE — Assessment & Plan Note (Signed)
Tolerated surgery well yesterday. Minimal abd pain and has been able to eat/drink. Post-op course now complicated by #1.  -Had been holding Xarelto for surgery, will restart as able pending cath/cardiac workup - Tylenol 650mg  q 6h - d/c ceftriaxone - Will have PT/OT See post-operatively

## 2022-11-18 NOTE — ED Provider Notes (Signed)
[place] EMERGENCY DEPARTMENT AT [facility] Provider Note   CSN: [id number] Arrival date & time: 11/18/22  0234     History  Chief Complaint  Patient presents with   Chest Pain    Mercedes Barr is a 77 y.o. female.   Chest Pain  Patient is a 77 year old female with past medical history significant for pacemaker A-fib on Xarelto last Xarelto intake was yesterday morning.  She states that she has had right upper quadrant abdominal pain and some sternal discomfort since 10 PM last night.  Her last p.o. intake was around 6 PM.  She has had some similar discomfort in the past but never so persistent.  She states the pain has been constant since it began nausea but no emesis.  She denies any significant shortness of breath.  No lightheadedness or syncope. No fevers, no urinary frequency urgency dysuria or hematuria.     Home Medications Prior to Admission medications   Medication Sig Start Date End Date Taking? Authorizing Provider  albuterol (VENTOLIN HFA) 108 (90 Base) MCG/ACT inhaler INHALE 2 PUFFS EVERY 4 HOURS AS NEEDED FOR WHEEZING 08/27/21   Lilland, Alana, DO  Blood Glucose Monitoring Suppl (ONE TOUCH ULTRA 2) w/Device KIT Use to check blood sugar as directed. 08/31/18   Westley Chandler, MD  cetirizine (ZYRTEC ALLERGY) 10 MG tablet Take 1 tablet (10 mg total) by mouth daily. 05/10/22   Lilland, Alana, DO  citalopram (CELEXA) 20 MG tablet TAKE 1 TABLET BY MOUTH IN THE  MORNING 09/08/22   Lilland, Alana, DO  diclofenac Sodium (VOLTAREN ARTHRITIS PAIN) 1 % GEL Apply 2 g topically 4 (four) times daily. 09/07/22   Lilland, Alana, DO  Dulaglutide (TRULICITY) 0.75 MG/0.5ML SOPN Inject 0.75 mg into the skin once a week. 09/07/22   Lilland, Alana, DO  empagliflozin (JARDIANCE) 10 MG TABS tablet TAKE 1 TABLET BY MOUTH EVERY DAY BEFORE BREAKFAST 10/28/22   Fayette Pho, MD  empagliflozin (JARDIANCE) 25 MG TABS tablet Take 1 tablet (25 mg total) by mouth daily. 08/22/22   Lilland,  Alana, DO  EPIPEN 2-PAK 0.3 MG/0.3ML SOAJ injection Inject 0.3 mLs (0.3 mg  total) into the muscle as  needed for severe allergic  reaction, and contact  medical provider. 10/27/15   Jamal Collin, MD  famotidine (PEPCID) 20 MG tablet TAKE 1 TABLET BY MOUTH TWICE A DAY 05/19/22   Lilland, Alana, DO  fluticasone (FLOVENT HFA) 110 MCG/ACT inhaler USE 1 INHALATION BY MOUTH TWICE  DAILY 10/28/22   Idalia Needle, Turkey J, DO  furosemide (LASIX) 20 MG tablet TAKE 1 TABLET BY MOUTH IN THE  MORNING 10/07/22   Lilland, Alana, DO  glucose blood (ONE TOUCH ULTRA TEST) test strip Use for blood sugar testing 1-3 times daily 08/15/19   Mirian Mo, MD  guaifenesin (ROBITUSSIN) 100 MG/5ML syrup TAKE 10 ML BY MOUTH 4 TIMES A DAY AS NEEDED FOR COUGH 08/17/22   Lilland, Alana, DO  HYDROcodone-acetaminophen (NORCO/VICODIN) 5-325 MG tablet Take half a tablet at night as needed for pain. 11/04/22   Alfredo Martinez, MD  insulin lispro (HUMALOG KWIKPEN) 100 UNIT/ML KwikPen Inject 12 Units into the skin 2 (two) times daily before a meal. 09/30/22   Lilland, Alana, DO  Insulin Pen Needle (BD PEN NEEDLE NANO U/F) 32G X 4 MM MISC USE AS DIRECTED WITH INSULIN 4  TIMES DAILY 09/21/22   Lilland, Alana, DO  Insulin Pen Needle 31G X 4 MM MISC 1 each by Does not apply route  daily. 01/11/22   Evelena Leyden, DO  Lancets MISC Use with blood sugar monitor to check daily 07/07/21   Westley Chandler, MD  LANTUS SOLOSTAR 100 UNIT/ML Solostar Pen INJECT SUBCUTANEOUSLY 40 UNITS  DAILY 08/15/22   Lilland, Alana, DO  levothyroxine (SYNTHROID) 112 MCG tablet TAKE 1 TABLET (112 MCG TOTAL) BY MOUTH EVERY MORNING. 30 MINUTES BEFORE FOOD 05/23/22   Lilland, Alana, DO  losartan (COZAAR) 25 MG tablet TAKE 1 TABLET BY MOUTH DAILY 10/07/22   Lilland, Alana, DO  Omega-3 Fatty Acids (FISH OIL) 1000 MG CAPS Take 1 capsule by mouth every morning. Reported on 02/11/2016 Patient not taking: Reported on 09/27/2022    [provider]  omeprazole (PRILOSEC) 20 MG capsule  TAKE 1 CAPSULE BY MOUTH  TWICE DAILY 02/01/21   Mirian Mo, MD  polyethylene glycol powder (GLYCOLAX/MIRALAX) powder TAKE 17 GRAMS BY MOUTH 2  TIMES DAILY AS NEEDED FOR  LAXATIVE 10/23/17   Diallo, Abdoulaye, MD  potassium chloride SA (KLOR-CON M) 20 MEQ tablet Take 2 tablets (40 mEq total) by mouth daily. 06/01/22   Graciella Freer, PA-C  PRODIGY NO CODING BLOOD GLUC test strip USE THREE TIMES A DAY TO CHECK ON BLOOD GLUCOSE LEVEL 10/02/20   Mirian Mo, MD  propranolol (INDERAL) 40 MG tablet TAKE 1 TABLET BY MOUTH DAILY 10/07/22   Lilland, Alana, DO  rivaroxaban (XARELTO) 20 MG TABS tablet TAKE 1 TABLET BY MOUTH DAILY WITH SUPPER 10/28/22   Fayette Pho, MD  rosuvastatin (CRESTOR) 20 MG tablet TAKE 1 TABLET BY MOUTH EVERY DAY 02/24/22   Lilland, Alana, DO  Spacer/Aero-Holding Chambers (AEROCHAMBER PLUS) inhaler Use as instructed 06/08/18   Domenick Gong, MD  SYMBICORT 80-4.5 MCG/ACT inhaler INHALE 2 PUFFS BY MOUTH INTO THE LUNGS TWICE A DAY 10/28/22   Fayette Pho, MD  Travoprost, BAK Free, (TRAVATAN) 0.004 % SOLN ophthalmic solution Place 1 drop into both eyes at bedtime. 09/15/14   Leona Singleton, MD  albuterol (PROVENTIL,VENTOLIN) 90 MCG/ACT inhaler Inhale 2 puffs into the lungs every 4 (four) hours as needed for wheezing or shortness of breath. 05/30/11 10/21/11  Ardyth Gal, MD      Allergies    Chlorhexidine, Naproxen, Pregabalin, Statins, Oxycodone, Amitriptyline hcl, Benazepril hcl, Gabapentin, Oxycodone-acetaminophen, and Propoxyphene    Review of Systems   Review of Systems  Cardiovascular:  Positive for chest pain.    Physical Exam Updated Vital Signs BP 133/68   Pulse 60   Temp 97.9 F (36.6 C) (Oral)   Resp (!) 23   Ht 5\' 2"  (1.575 m)   Wt 65.8 kg   SpO2 93%   BMI 26.52 kg/m  Physical Exam Vitals and nursing note reviewed.  Constitutional:      General: She is not in acute distress. HENT:     Head: Normocephalic and atraumatic.     Nose:  Nose normal.  Eyes:     General: No scleral icterus. Cardiovascular:     Rate and Rhythm: Normal rate and regular rhythm.     Pulses: Normal pulses.     Heart sounds: Normal heart sounds.  Pulmonary:     Effort: Pulmonary effort is normal. No respiratory distress.     Breath sounds: No wheezing.  Abdominal:     Palpations: Abdomen is soft.     Tenderness: There is abdominal tenderness.     Comments: Focal right upper quadrant tenderness, positive Murphy sign  Musculoskeletal:     Cervical back: Normal range of motion.  Right lower leg: No edema.     Left lower leg: No edema.  Skin:    General: Skin is warm and dry.     Capillary Refill: Capillary refill takes less than 2 seconds.  Neurological:     Mental Status: She is alert. Mental status is at baseline.  Psychiatric:        Mood and Affect: Mood normal.        Behavior: Behavior normal.     ED Results / Procedures / Treatments   Labs (all labs ordered are listed, but only abnormal results are displayed) Labs Reviewed  BASIC METABOLIC PANEL - Abnormal; Notable for the following components:      Result Value   Glucose, Bld 182 (*)    Calcium 8.6 (*)    All other components within normal limits  HEPATIC FUNCTION PANEL - Abnormal; Notable for the following components:   Albumin 3.4 (*)    AST 44 (*)    Bilirubin, Direct 0.5 (*)    All other components within normal limits  TROPONIN I (HIGH SENSITIVITY) - Abnormal; Notable for the following components:   Troponin I (High Sensitivity) 84 (*)    All other components within normal limits  CBC  LIPASE, BLOOD  URINALYSIS, ROUTINE W REFLEX MICROSCOPIC  TROPONIN I (HIGH SENSITIVITY)  TROPONIN I (HIGH SENSITIVITY)    EKG EKG Interpretation  Date/Time:  Friday November 18 2022 02:42:04 EDT Ventricular Rate:  61 PR Interval:  80 QRS Duration: 150 QT Interval:  493 QTC Calculation: 497 R Axis:   -39 Text Interpretation: VENTRICULAR PACED RHYTHM When compared with ECG  of 09/30/2015, VENTRICULAR PACED RHYTHM has replaced AV dual-paced rhythm Confirmed by Dione Booze ([id number]) on 11/18/2022 2:46:48 AM  Radiology US Abdomen Limited RUQ (LIVER/GB)  Result Date: 11/18/2022 CLINICAL DATA:  Right upper quadrant abdominal pain EXAM: ULTRASOUND ABDOMEN LIMITED RIGHT UPPER QUADRANT COMPARISON:  CT from earlier today FINDINGS: Gallbladder: Few gallstones. There is gallbladder region tenderness and borderline wall thickening to 4 mm. No pericholecystic fluid. Common bile duct: Diameter: 6 mm Liver: Echogenic liver with pericholecystic sparing. Portal vein is patent on color Doppler imaging with normal direction of blood flow towards the liver. IMPRESSION: Cholelithiasis. Equivocal for cholecystitis, there is right upper quadrant tenderness and borderline gallbladder wall thickening but no pericholecystic edema by CT or ultrasound. Electronically Signed   By: Tiburcio Pea M.D.   On: 11/18/2022 07:05   CT ABDOMEN PELVIS W CONTRAST  Result Date: 11/18/2022 CLINICAL DATA:  Acute, nonlocalized abdominal pain, right upper quadrant. Tenderness to palpation. EXAM: CT ABDOMEN AND PELVIS WITH CONTRAST TECHNIQUE: Multidetector CT imaging of the abdomen and pelvis was performed using the standard protocol following bolus administration of intravenous contrast. RADIATION DOSE REDUCTION: This exam was performed according to the departmental dose-optimization program which includes automated exposure control, adjustment of the mA and/or kV according to patient size and/or use of iterative reconstruction technique. CONTRAST:  75mL OMNIPAQUE IOHEXOL 350 MG/ML SOLN COMPARISON:  02/01/2013 FINDINGS: Lower chest: Biventricular pacer. Thinning of the left ventricular apex suggesting prior infarction. Coronary calcification. Hepatobiliary: No focal liver abnormality.Cholelithiasis. No evidence of acute cholecystitis. No biliary dilatation. Pancreas: Unremarkable. Spleen: Unremarkable. Adrenals/Urinary  Tract: Negative adrenals. No hydronephrosis or stone. Mild renal cortical scarring bilaterally. Unremarkable bladder. Stomach/Bowel:  No obstruction. No appendicitis. Vascular/Lymphatic: No acute vascular abnormality. Scattered atheromatous calcification of the aorta. No mass or adenopathy. Reproductive:Posterior uterine fibroid measuring nearly 4 cm. Other: No ascites or pneumoperitoneum. Musculoskeletal: No acute abnormalities. Generalized  lower thoracic and lumbar spine degeneration. IMPRESSION: 1. No acute finding. 2. Cholelithiasis without signs of acute cholecystitis. 3. Atherosclerosis.  Evidence of prior left ventricular infarct. Electronically Signed   By: Tiburcio Pea M.D.   On: 11/18/2022 05:49   DG Chest 2 View  Result Date: 11/18/2022 CLINICAL DATA:  Chest pain for 2 days, initial encounter EXAM: CHEST - 2 VIEW COMPARISON:  11/09/2022 FINDINGS: Cardiac shadow is stable. Pacing device is again seen and stable. The lungs are clear. Visualized upper abdomen and bony structures are within normal limits. IMPRESSION: No acute abnormality noted. Electronically Signed   By: Alcide Clever M.D.   On: 11/18/2022 03:54   CUP PACEART REMOTE DEVICE CHECK  Result Date: 11/16/2022 Scheduled remote reviewed. Normal device function.  Known AF, Xarelto per EPIC Next remote 91 days. LA, CVRS   Procedures .Critical Care  Performed by: Gailen Shelter, PA Authorized by: Gailen Shelter, PA   Critical care provider statement:    Critical care time (minutes):  35   Critical care time was exclusive of:  Separately billable procedures and treating other patients and teaching time   Critical care was necessary to treat or prevent imminent or life-threatening deterioration of the following conditions: Surgical abdomen, also NSTEMI likely demand.   Critical care was time spent personally by me on the following activities:  Development of treatment plan with patient or surrogate, review of old charts,  re-evaluation of patient's condition, pulse oximetry, ordering and review of radiographic studies, ordering and review of laboratory studies, ordering and performing treatments and interventions, obtaining history from patient or surrogate, examination of patient and evaluation of patient's response to treatment   Care discussed with: admitting provider       Medications Ordered in ED Medications  morphine (PF) 4 MG/ML injection 4 mg (4 mg Intravenous Given 11/18/22 0349)  ondansetron (ZOFRAN) injection 4 mg (4 mg Intravenous Given 11/18/22 0348)  morphine (PF) 4 MG/ML injection 4 mg (4 mg Intravenous Given 11/18/22 0504)  iohexol (OMNIPAQUE) 350 MG/ML injection 75 mL (75 mLs Intravenous Contrast Given 11/18/22 0544)  morphine (PF) 4 MG/ML injection 4 mg (4 mg Intravenous Given 11/18/22 1610)    ED Course/ Medical Decision Making/ A&P Clinical Course as of 11/18/22 0729  Fri Nov 18, 2022  0703 Shari Prows -  Xarelto last yesterday AM [WF]  0715 77 yo female w/ heart block s/p pacemaker, a fib on A/C, presenting with epigastric and RUQ abdominal pain acute onset last night around 10 pm, has had similar attacks in the past but never this persistent or severe.  Constant pain all night.  Nausea, no vomiting.  On exam has focal sig RUQ tenderness, +Murphy sign.  CT and abdominal U/S showing gallstones, borderline wall thickening, CBD wnl.  LFT's and lipase wnl.  Trop 13 -> 84.  ECG showing V paced rhythm per my interpretation.  Plan for surgery consult for suspected cholecystitis.  PA overnight spoke to cardiologist Dr Shari Prows regarding mild trop increase, likely demand ischemia, planning to trend troponin levels.  Pt's pain improved on my assessment now after 3 rounds of IV morphine, but still present.  Daughter is present at bedside for discussion. [MT]    Clinical Course User Index [MT] Trifan, Kermit Balo, MD [WF] Gailen Shelter, [place]                             Medical Decision Making Amount  and/or  Complexity of Data Reviewed Labs: ordered. Radiology: ordered.  Risk Prescription drug management.   This patient presents to the ED for concern of abd pain / cp, this involves a number of treatment options, and is a complaint that carries with it a high risk of complications and morbidity. A differential diagnosis was considered for the patient's symptoms which is discussed below:   The emergent causes of chest pain include: Acute coronary syndrome, tamponade, pericarditis/myocarditis, aortic dissection, pulmonary embolism, tension pneumothorax, pneumonia, and esophageal rupture.    The causes of generalized abdominal pain include but are not limited to AAA, mesenteric ischemia, appendicitis, diverticulitis, DKA, gastritis, gastroenteritis, AMI, nephrolithiasis, pancreatitis, peritonitis, adrenal insufficiency,lead poisoning, iron toxicity, intestinal ischemia, constipation, UTI,SBO/LBO, splenic rupture, biliary disease, IBD, IBS, PUD, or hepatitis.     Co morbidities: Discussed in HPI   Brief History:  Patient is a 77 year old female with past medical history significant for pacemaker A-fib on Xarelto last Xarelto intake was yesterday morning.  She states that she has had right upper quadrant abdominal pain and some sternal discomfort since 10 PM last night.  Her last p.o. intake was around 6 PM.  She has had some similar discomfort in the past but never so persistent.  She states the pain has been constant since it began nausea but no emesis.  She denies any significant shortness of breath.  No lightheadedness or syncope. No fevers, no urinary frequency urgency dysuria or hematuria.    EMR reviewed including pt PMHx, past surgical history and past visits to ER.   See HPI for more details   Lab Tests:  I ordered and independently interpreted labs. Labs notable for CBC with uptrending WBC but no frank leukocytosis, BMP unremarkable apart from hyperglycemia LFTs  unremarkable troponin increased from teens to 87 discussed with cardiology, third troponin ordered.  Urinalysis pending, lipase within normal limits  Imaging Studies:  Abnormal findings. I personally reviewed all imaging studies. Imaging notable for  CT and ultrasound show equivocal cholecystitis there are gallstones present patient is exquisitely tender in right upper quadrant.  Cardiac Monitoring:  The patient was maintained on a cardiac monitor.  I personally viewed and interpreted the cardiac monitored which showed an underlying rhythm of: Ventricularly paced rhythm EKG non-ischemic   Medicines ordered:  I ordered medication including morphine x 3 for pain Reevaluation of the patient after these medicines showed that the patient improved I have reviewed the patients home medicines and have made adjustments as needed   Critical Interventions:     Consults/Attending Physician     Reevaluation:  After the interventions noted above I re-evaluated patient and found that they have :improved   Social Determinants of Health:      Problem List / ED Course:  Pain improved but not completely resolved.  She has had right upper quadrant abdominal pain since 10 PM last night.  Exquisite right upper quadrant tenderness and positive Murphy sign.  Is anticoagulated with Xarelto last intake was yesterday morning last p.o. intake was 6 PM yesterday evening.   Dispostion:  After consideration of the diagnostic results and the patients response to treatment, I feel that the patent would benefit from admission.  Morning team aware patient will admit   Final Clinical Impression(s) / ED Diagnoses Final diagnoses:  None    Rx / DC Orders ED Discharge Orders     None         Gailen Shelter, [place] 11/18/22 0729    Dione Booze, MD 11/18/22  0817  

## 2022-11-18 NOTE — ED Triage Notes (Signed)
Pt complaining of chest pain that started around 10 pm attempted to go to sleep but at 12 it got worse. Has a pacemaker

## 2022-11-18 NOTE — Consult Note (Signed)
Consult Note  Mercedes Barr [birth year]  [id number].    Requesting MD: Riki Sheer, PA-C Chief Complaint/Reason for Consult: Acute cholecystitis  HPI:  Patient is a 77 year old female who presented to the ED with abdominal pain that started around 10 PM last night. Pain has been in epigastric abdomen and chest and radiating around her right side. Pain has been constant and worsening since onset. Patient initially thought no similar episodes previously but on further questioning reports she would sometimes have less severe pain after eating that radiated the same as this. Associated nausea and NBNB emesis. Denies fever, chills, diarrhea, urinary symptoms. PMH otherwise significant for COPD, CAD, Complete heart block s/p PPM, A. Fib on xarelto (last dose 4/11 AM), HTN, Hypothyroidism, T2DM and GERD. Prior abdominal surgery includes laparoscopic tubal ligation. Several allergies/intolerances to medications in chart but none to antibiotics. Patient does have allergy listed to CHG. Her daughter is at bedside with her and reports she had her gallbladder removed in 2008.   ROS: Negative other than HPI  Family History  Problem Relation Age of Onset   Heart failure Mother    Hypertension Mother    Stroke Father    Heart failure Sister    Kidney disease Daughter    Cancer Brother    Breast cancer Neg Hx     Past Medical History:  Diagnosis Date   Arthritis 4/14   Tr TKR   Asthma    Complete heart block    COPD (chronic obstructive pulmonary disease)    Coronary artery disease    Diabetes mellitus    GERD (gastroesophageal reflux disease)    Heart murmur    Hypertension    Hypothyroidism    Normal coronary arteries 2011   Pacemaker    PACEMAKER DEPENDENT-[facility]. CR[facility]ITORU [facility].  OFFICE NOTE DR. A. LITTLE STATES  "EXTREMELY PACEMAKER SENSITIVE AND WHEN YOU TRY TO CHECK FOR UNDERLYING RHYTHMS SHE WILL HAVE SNYCOPE AND WE HAVE NOT DONE THIS NOW IN  ABOUT 2 YEARS".   Shortness of breath dyspnea    walking distance or climbing stairs    Past Surgical History:  Procedure Laterality Date   ATRIOVENTRICULAR CUSHION DEFECT REPAIR  1988   CARDIAC CATHETERIZATION  2011    SHOWED NO CAD-PER CARDIOLOGY OFFICE NOTES DR. A. LITTLE   colonscopy      removed polyps    INSERT / REPLACE / REMOVE PACEMAKER     PACEMAKER INSERTION  1988   last gen 11/08- MDT   PPM GENERATOR CHANGEOUT N/A 02/15/2017   Procedure: PPM Generator Changeout;  Surgeon: Thurmon Fair, MD;  Location: [facility];  Service: Cardiovascular;  Laterality: N/A;   TOTAL KNEE ARTHROPLASTY Right 11/19/2012   Procedure: RIGHT TOTAL KNEE ARTHROPLASTY;  Surgeon: Loanne Drilling, MD;  Location: [facility];  Service: Orthopedics;  Laterality: Right;   TOTAL KNEE REVISION Right 09/24/2014   Procedure: RIGHT TOTAL KNEE ARTHROPLASTY REVISION;  Surgeon: Loanne Drilling, MD;  Location: [facility];  Service: Orthopedics;  Laterality: Right;   TUBAL LIGATION      Social History:  reports that she has never smoked. She has never been exposed to tobacco smoke. She has never used smokeless tobacco. She reports that she does not drink alcohol and does not use drugs.  Allergies:  Allergies  Allergen Reactions   Chlorhexidine Itching and Rash   Naproxen Itching and Swelling   Pregabalin Nausea And Vomiting and  Itching   Statins Other (See Comments)    REACTION: leg pains PT IS ABLE TO TOLERATE CRESTOR   Oxycodone     Other reaction(s): Not available   Amitriptyline Hcl Itching and Rash   Benazepril Hcl Itching and Rash   Gabapentin Itching   Oxycodone-Acetaminophen Itching   Propoxyphene Itching    (Not in a hospital admission)   Blood pressure 133/68, pulse 60, temperature 97.9 F (36.6 C), temperature source Oral, resp. rate (!) 23, height  (1.575 m), weight 65.8 kg, SpO2 93 %. Physical Exam:  General: pleasant, WD, WN female who is laying in bed in NAD HEENT: head is  normocephalic, atraumatic.  Sclera are anicteric. Ears and nose without any masses or lesions.  Mouth is pink and moist Heart: regular, rate, and rhythm.  Normal s1,s2. No obvious murmurs, gallops, or rubs noted.  Palpable radial and pedal pulses bilaterally Lungs: CTAB, no wheezes, rhonchi, or rales noted.  Respiratory effort nonlabored Abd: soft, TTP in RUQ with positive murphy sign, ND, +BS, no masses, hernias, or organomegaly MS: all 4 extremities are symmetrical with no cyanosis, clubbing, or edema. Skin: warm and dry with no masses, lesions, or rashes Neuro: Cranial nerves 2-12 grossly intact, sensation is normal throughout Psych: A&Ox3 with an appropriate affect.   Results for orders placed or performed during the hospital encounter of 11/18/22 (from the past 48 hour(s))  Basic metabolic panel     Status: Abnormal   Collection Time: 11/18/22  2:45 AM  Result Value Ref Range   Sodium 137 135 - 145 mmol/L   Potassium 4.6 3.5 - 5.1 mmol/L    Comment: HEMOLYSIS AT THIS LEVEL MAY AFFECT RESULT   Chloride 102 98 - 111 mmol/L   CO2 23 22 - 32 mmol/L   Glucose, Bld 182 (H) 70 - 99 mg/dL    Comment: Glucose reference range applies only to samples taken after fasting for at least 8 hours.   BUN 12 8 - 23 mg/dL   Creatinine, Ser 4.09 0.44 - 1.00 mg/dL   Calcium 8.6 (L) 8.9 - 10.3 mg/dL   GFR, Estimated >81 >19 mL/min    Comment: (NOTE) Calculated using the CKD-EPI Creatinine Equation (2021)    Anion gap 12 5 - 15    Comment: Performed at [facility], [address] [place], [place] [address]  CBC     Status: None   Collection Time: 11/18/22  2:45 AM  Result Value Ref Range   WBC 8.9 4.0 - 10.5 K/uL   RBC 4.91 3.87 - 5.11 MIL/uL   Hemoglobin 13.7 12.0 - 15.0 g/dL   HCT 95.6 21.3 - 08.6 %   MCV 84.7 80.0 - 100.0 fL   MCH 27.9 26.0 - 34.0 pg   MCHC 32.9 30.0 - 36.0 g/dL   RDW 57.8 46.9 - 62.9 %   Platelets 295 150 - 400 K/uL   nRBC 0.0 0.0 - 0.2 %    Comment: Performed at  [facility], [address], [place], [place] [address]  Troponin I (High Sensitivity)     Status: None   Collection Time: 11/18/22  2:45 AM  Result Value Ref Range   Troponin I (High Sensitivity) 13 <18 ng/L    Comment: (NOTE) Elevated high sensitivity troponin I (hsTnI) values and significant  changes across serial measurements may suggest ACS but many other  chronic and acute conditions are known to elevate hsTnI results.  Refer to the "Links" section  for chest pain algorithms and additional  guidance. Performed at [facility], [address], [place], [place] [address]   Hepatic function panel     Status: Abnormal   Collection Time: 11/18/22  3:55 AM  Result Value Ref Range   Total Protein 6.8 6.5 - 8.1 g/dL   Albumin 3.4 (L) 3.5 - 5.0 g/dL   AST 44 (H) 15 - 41 U/L    Comment: HEMOLYSIS AT THIS LEVEL MAY AFFECT RESULT   ALT 20 0 - 44 U/L    Comment: HEMOLYSIS AT THIS LEVEL MAY AFFECT RESULT   Alkaline Phosphatase 46 38 - 126 U/L    Comment: HEMOLYSIS AT THIS LEVEL MAY AFFECT RESULT   Total Bilirubin 0.9 0.3 - 1.2 mg/dL    Comment: HEMOLYSIS AT THIS LEVEL MAY AFFECT RESULT   Bilirubin, Direct 0.5 (H) 0.0 - 0.2 mg/dL    Comment: HEMOLYSIS AT THIS LEVEL MAY AFFECT RESULT   Indirect Bilirubin 0.4 0.3 - 0.9 mg/dL    Comment: Performed at [facility], [address], [place], [place] [address]  Lipase, blood     Status: None   Collection Time: 11/18/22  3:55 AM  Result Value Ref Range   Lipase 23 11 - 51 U/L    Comment: HEMOLYSIS AT THIS LEVEL MAY AFFECT RESULT Performed at [facility], [address] [place], [place] [address]   Troponin I (High Sensitivity)     Status: Abnormal   Collection Time: 11/18/22  5:36 AM  Result Value Ref Range   Troponin I (High Sensitivity) 84 (H) <18 ng/L    Comment: RESULT CALLED TO, READ BACK BY AND VERIFIED WITH RYAN TERRY RN 11/18/22 0651 M KOROLESKI (NOTE) Elevated high sensitivity troponin I (hsTnI) values  and significant  changes across serial measurements may suggest ACS but many other  chronic and acute conditions are known to elevate hsTnI results.  Refer to the "Links" section for chest pain algorithms and additional  guidance. Performed at [facility], [address], [place], [place] [address]    US Abdomen Limited RUQ (LIVER/GB)  Result Date: 11/18/2022 CLINICAL DATA:  Right upper quadrant abdominal pain EXAM: ULTRASOUND ABDOMEN LIMITED RIGHT UPPER QUADRANT COMPARISON:  CT from earlier today FINDINGS: Gallbladder: Few gallstones. There is gallbladder region tenderness and borderline wall thickening to 4 mm. No pericholecystic fluid. Common bile duct: Diameter: 6 mm Liver: Echogenic liver with pericholecystic sparing. Portal vein is patent on color Doppler imaging with normal direction of blood flow towards the liver. IMPRESSION: Cholelithiasis. Equivocal for cholecystitis, there is right upper quadrant tenderness and borderline gallbladder wall thickening but no pericholecystic edema by CT or ultrasound. Electronically Signed   By: Tiburcio Pea M.D.   On: 11/18/2022 07:05   CT ABDOMEN PELVIS W CONTRAST  Result Date: 11/18/2022 CLINICAL DATA:  Acute, nonlocalized abdominal pain, right upper quadrant. Tenderness to palpation. EXAM: CT ABDOMEN AND PELVIS WITH CONTRAST TECHNIQUE: Multidetector CT imaging of the abdomen and pelvis was performed using the standard protocol following bolus administration of intravenous contrast. RADIATION DOSE REDUCTION: This exam was performed according to the departmental dose-optimization program which includes automated exposure control, adjustment of the mA and/or kV according to patient size and/or use of iterative reconstruction technique. CONTRAST:  27mL OMNIPAQUE IOHEXOL 350 MG/ML SOLN COMPARISON:  02/01/2013 FINDINGS: Lower chest: Biventricular pacer. Thinning of the left ventricular apex suggesting prior infarction. Coronary calcification.  Hepatobiliary: No focal liver abnormality.Cholelithiasis. No evidence of acute  cholecystitis. No biliary dilatation. Pancreas: Unremarkable. Spleen: Unremarkable. Adrenals/Urinary Tract: Negative adrenals. No hydronephrosis or stone. Mild renal cortical scarring bilaterally. Unremarkable bladder. Stomach/Bowel:  No obstruction. No appendicitis. Vascular/Lymphatic: No acute vascular abnormality. Scattered atheromatous calcification of the aorta. No mass or adenopathy. Reproductive:Posterior uterine fibroid measuring nearly 4 cm. Other: No ascites or pneumoperitoneum. Musculoskeletal: No acute abnormalities. Generalized lower thoracic and lumbar spine degeneration. IMPRESSION: 1. No acute finding. 2. Cholelithiasis without signs of acute cholecystitis. 3. Atherosclerosis.  Evidence of prior left ventricular infarct. Electronically Signed   By: Tiburcio Pea M.D.   On: 11/18/2022 05:49   DG Chest 2 View  Result Date: 11/18/2022 CLINICAL DATA:  Chest pain for 2 days, initial encounter EXAM: CHEST - 2 VIEW COMPARISON:  11/09/2022 FINDINGS: Cardiac shadow is stable. Pacing device is again seen and stable. The lungs are clear. Visualized upper abdomen and bony structures are within normal limits. IMPRESSION: No acute abnormality noted. Electronically Signed   By: Alcide Clever M.D.   On: 11/18/2022 03:54      Assessment/Plan Acute cholecystitis  - CT with cholelithiasis, no signs of cholecystitis  - RUQ [place] equivocal for cholecystitis  - no leukocytosis and afebrile but positive Murphy sign on exam - Hx and exam more consistent with acute cholecystitis  - recommend medical admission and hold Xarelto - can have heparin gtt if needed but please let surgical team know if ordering so we can make sure enough time off prior to OR - will plan lap chole tomorrow - ok to have diet today as tolerated but will make NPO after MN  FEN: diet as tolerated, NPO after MN VTE: hold xarelto ID: [email or web address] ordered   - per  medicine -  COPD CAD Complete heart block s/p PPM A. Fib on xarelto - last dose 4/11 AM HTN Hypothyroidism T2DM GERD   I reviewed ED provider notes, last 24 h vitals and pain scores, last 48 h intake and output, last 24 h labs and trends, and last 24 h imaging results.   Juliet Rude, [facility] 11/18/2022, 8:28 AM Please see Amion for pager number during day hours 7:00am-4:30pm

## 2022-11-18 NOTE — TOC Progression Note (Signed)
Transition of Care [facility]) - Progression Note    Patient Details  Name: Mercedes Barr MRN: [id number] Date of Birth: 08-10-1945  Transition of Care [facility]) CM/SW Contact  Leone Haven, RN Phone Number: 11/18/2022, 2:30 PM  Clinical Narrative:     From home, presenting with epigastric and RUQ abdominal pain. Per surgery acute cholecystitis, plan for lap chole tomorrow. TOC following.         Expected Discharge Plan and Services                                               Social Determinants of Health (SDOH) Interventions SDOH Screenings   Food Insecurity: No Food Insecurity (09/27/2022)  Housing: Low Risk  (09/27/2022)  Transportation Needs: No Transportation Needs (09/27/2022)  Utilities: Not At Risk (09/27/2022)  Alcohol Screen: Low Risk  (09/27/2022)  Depression (PHQ2-9): Medium Risk (11/02/2022)  Financial Resource Strain: Low Risk  (09/27/2022)  Physical Activity: Insufficiently Active (09/27/2022)  Social Connections: Moderately Isolated (09/27/2022)  Stress: No Stress Concern Present (09/27/2022)  Tobacco Use: Low Risk  (11/18/2022)    Readmission Risk Interventions     No data to display

## 2022-11-18 NOTE — ED Notes (Signed)
ED TO INPATIENT HANDOFF REPORT  ED Nurse Name and Phone #: Theophilus Bones [phone]  S Name/Age/Gender Mercedes Barr 77 y.o. female Room/Bed: 039C/039C  Code Status   Code Status: DNR  Home/SNF/Other Home Patient oriented to: self, place, time, and situation Is this baseline? Yes   Triage Complete: Triage complete  Chief Complaint Abdominal pain [R10.9]  Triage Note Pt complaining of chest pain that started around 10 pm attempted to go to sleep but at 12 it got worse. Has a pacemaker   Allergies Allergies  Allergen Reactions   Chlorhexidine Itching and Rash   Naproxen Itching and Swelling   Pregabalin Nausea And Vomiting and Itching   Statins Other (See Comments)    Leg pains PT IS ABLE TO TOLERATE CRESTOR   Oxycodone Itching   Amitriptyline Hcl Itching and Rash   Benazepril Hcl Itching and Rash   Gabapentin Itching   Oxycodone-Acetaminophen Itching   Propoxyphene Itching    Level of Care/Admitting Diagnosis ED Disposition     ED Disposition  Admit   Condition  --   Comment  Hospital Area: [facility] [[id number]]  Level of Care: Telemetry Medical [104]  May place patient in observation at [facility] or [facility] if equivalent level of care is available:: Yes  Covid Evaluation: Asymptomatic - no recent exposure (last 10 days) testing not required  Diagnosis: Abdominal pain [454098]  Admitting Physician: Cora Collum [[id number]]  Attending Physician: Latrelle Dodrill [phone]          B Medical/Surgery History Past Medical History:  Diagnosis Date   Arthritis 4/14   Tr TKR   Asthma    Complete heart block    COPD (chronic obstructive pulmonary disease)    Coronary artery disease    Diabetes mellitus    GERD (gastroesophageal reflux disease)    Heart murmur    Hypertension    Hypothyroidism    Normal coronary arteries 2011   Pacemaker    PACEMAKER DEPENDENT-[facility].  OFFICE NOTE  DR. A. LITTLE STATES  "EXTREMELY PACEMAKER SENSITIVE AND WHEN YOU TRY TO CHECK FOR UNDERLYING RHYTHMS SHE WILL HAVE SNYCOPE AND WE HAVE NOT DONE THIS NOW IN ABOUT 2 YEARS".   Shortness of breath dyspnea    walking distance or climbing stairs   Past Surgical History:  Procedure Laterality Date   ATRIOVENTRICULAR CUSHION DEFECT REPAIR  1988   CARDIAC CATHETERIZATION  2011    SHOWED NO CAD-PER CARDIOLOGY OFFICE NOTES DR. A. LITTLE   colonscopy      removed polyps    INSERT / REPLACE / REMOVE PACEMAKER     PACEMAKER INSERTION  1988   last gen 11/08- MDT   PPM GENERATOR CHANGEOUT N/A 02/15/2017   Procedure: PPM Generator Changeout;  Surgeon: Thurmon Fair, MD;  Location: [facility] INVASIVE CV LAB;  Service: Cardiovascular;  Laterality: N/A;   TOTAL KNEE ARTHROPLASTY Right 11/19/2012   Procedure: RIGHT TOTAL KNEE ARTHROPLASTY;  Surgeon: Loanne Drilling, MD;  Location: [facility] ORS;  Service: Orthopedics;  Laterality: Right;   TOTAL KNEE REVISION Right 09/24/2014   Procedure: RIGHT TOTAL KNEE ARTHROPLASTY REVISION;  Surgeon: Loanne Drilling, MD;  Location: [facility] ORS;  Service: Orthopedics;  Laterality: Right;   TUBAL LIGATION       A IV Location/Drains/Wounds Patient Lines/Drains/Airways Status     Active Line/Drains/Airways     Name Placement date Placement time Site Days   Peripheral IV 11/18/22 20 G 1"  Anterior;Left;Proximal Forearm 11/18/22  0200  Forearm  less than 1            Intake/Output Last 24 hours  Intake/Output Summary (Last 24 hours) at 11/18/2022 1133 Last data filed at 11/18/2022 1112 Gross per 24 hour  Intake 101.09 ml  Output --  Net 101.09 ml    Labs/Imaging Results for orders placed or performed during the hospital encounter of 11/18/22 (from the past 48 hour(s))  Basic metabolic panel     Status: Abnormal   Collection Time: 11/18/22  2:45 AM  Result Value Ref Range   Sodium 137 135 - 145 mmol/L   Potassium 4.6 3.5 - 5.1 mmol/L    Comment: HEMOLYSIS AT THIS LEVEL MAY  AFFECT RESULT   Chloride 102 98 - 111 mmol/L   CO2 23 22 - 32 mmol/L   Glucose, Bld 182 (H) 70 - 99 mg/dL    Comment: Glucose reference range applies only to samples taken after fasting for at least 8 hours.   BUN 12 8 - 23 mg/dL   Creatinine, Ser 1.61 0.44 - 1.00 mg/dL   Calcium 8.6 (L) 8.9 - 10.3 mg/dL   GFR, Estimated >09 >60 mL/min    Comment: (NOTE) Calculated using the CKD-EPI Creatinine Equation (2021)    Anion gap 12 5 - 15    Comment: Performed at [facility], [address], [place], [place] [address]  CBC     Status: None   Collection Time: 11/18/22  2:45 AM  Result Value Ref Range   WBC 8.9 4.0 - 10.5 K/uL   RBC 4.91 3.87 - 5.11 MIL/uL   Hemoglobin 13.7 12.0 - 15.0 g/dL   HCT 81.1 91.4 - 78.2 %   MCV 84.7 80.0 - 100.0 fL   MCH 27.9 26.0 - 34.0 pg   MCHC 32.9 30.0 - 36.0 g/dL   RDW 95.6 21.3 - 08.6 %   Platelets 295 150 - 400 K/uL   nRBC 0.0 0.0 - 0.2 %    Comment: Performed at [facility], [address], [place], [place] [address]  Troponin I (High Sensitivity)     Status: None   Collection Time: 11/18/22  2:45 AM  Result Value Ref Range   Troponin I (High Sensitivity) 13 <18 ng/L    Comment: (NOTE) Elevated high sensitivity troponin I (hsTnI) values and significant  changes across serial measurements may suggest ACS but many other  chronic and acute conditions are known to elevate hsTnI results.  Refer to the "Links" section for chest pain algorithms and additional  guidance. Performed at [facility], [address] [place], [place] [address]   Urinalysis, Routine w reflex microscopic -Urine, Clean Catch     Status: Abnormal   Collection Time: 11/18/22  3:34 AM  Result Value Ref Range   Color, Urine YELLOW YELLOW   APPearance CLEAR CLEAR   Specific Gravity, Urine >1.046 (H) 1.005 - 1.030   pH 5.0 5.0 - 8.0   Glucose, UA >=500 (A) NEGATIVE mg/dL   Hgb urine dipstick NEGATIVE NEGATIVE   Bilirubin Urine NEGATIVE NEGATIVE   Ketones,  ur NEGATIVE NEGATIVE mg/dL   Protein, ur NEGATIVE NEGATIVE mg/dL   Nitrite NEGATIVE NEGATIVE   Leukocytes,Ua NEGATIVE NEGATIVE   RBC / HPF 0-5 0 - 5 RBC/hpf   WBC, UA 0-5 0 - 5 WBC/hpf   Bacteria, UA NONE SEEN NONE SEEN   Squamous Epithelial / HPF 0-5 0 - 5 /HPF  Comment: Performed at [facility], [address], [place], [place] [address]  Hepatic function panel     Status: Abnormal   Collection Time: 11/18/22  3:55 AM  Result Value Ref Range   Total Protein 6.8 6.5 - 8.1 g/dL   Albumin 3.4 (L) 3.5 - 5.0 g/dL   AST 44 (H) 15 - 41 U/L    Comment: HEMOLYSIS AT THIS LEVEL MAY AFFECT RESULT   ALT 20 0 - 44 U/L    Comment: HEMOLYSIS AT THIS LEVEL MAY AFFECT RESULT   Alkaline Phosphatase 46 38 - 126 U/L    Comment: HEMOLYSIS AT THIS LEVEL MAY AFFECT RESULT   Total Bilirubin 0.9 0.3 - 1.2 mg/dL    Comment: HEMOLYSIS AT THIS LEVEL MAY AFFECT RESULT   Bilirubin, Direct 0.5 (H) 0.0 - 0.2 mg/dL    Comment: HEMOLYSIS AT THIS LEVEL MAY AFFECT RESULT   Indirect Bilirubin 0.4 0.3 - 0.9 mg/dL    Comment: Performed at [facility], [address] [place], [place] [address]  Lipase, blood     Status: None   Collection Time: 11/18/22  3:55 AM  Result Value Ref Range   Lipase 23 11 - 51 U/L    Comment: HEMOLYSIS AT THIS LEVEL MAY AFFECT RESULT Performed at [facility], [address], [place], [place] [address]   Troponin I (High Sensitivity)     Status: Abnormal   Collection Time: 11/18/22  5:36 AM  Result Value Ref Range   Troponin I (High Sensitivity) 84 (H) <18 ng/L    Comment: RESULT CALLED TO, READ BACK BY AND VERIFIED WITH RYAN TERRY RN 11/18/22 0651 M KOROLESKI (NOTE) Elevated high sensitivity troponin I (hsTnI) values and significant  changes across serial measurements may suggest ACS but many other  chronic and acute conditions are known to elevate hsTnI results.  Refer to the "Links" section for chest pain algorithms and additional  guidance. Performed at  [facility], [address], [place], [place] [address]   Troponin I (High Sensitivity)     Status: Abnormal   Collection Time: 11/18/22  7:33 AM  Result Value Ref Range   Troponin I (High Sensitivity) 102 (HH) <18 ng/L    Comment: CRITICAL RESULT CALLED TO, READ BACK BY AND VERIFIED WITH C,SCHILLING RN  11/18/22 E,[place] (NOTE) Elevated high sensitivity troponin I (hsTnI) values and significant  changes across serial measurements may suggest ACS but many other  chronic and acute conditions are known to elevate hsTnI results.  Refer to the "Links" section for chest pain algorithms and additional  guidance. Performed at [facility], [address], [place], [place] [address]   Troponin I (High Sensitivity)     Status: Abnormal   Collection Time: 11/18/22 10:17 AM  Result Value Ref Range   Troponin I (High Sensitivity) 93 (H) <18 ng/L    Comment: (NOTE) Elevated high sensitivity troponin I (hsTnI) values and significant  changes across serial measurements may suggest ACS but many other  chronic and acute conditions are known to elevate hsTnI results.  Refer to the "Links" section for chest pain algorithms and additional  guidance. Performed at [facility], [address] [place], [place] [address]    US Abdomen Limited RUQ (LIVER/GB)  Result Date: 11/18/2022 CLINICAL DATA:  Right upper quadrant abdominal pain EXAM: ULTRASOUND ABDOMEN LIMITED RIGHT UPPER QUADRANT COMPARISON:  CT from earlier today FINDINGS: Gallbladder: Few gallstones. There is gallbladder  region tenderness and borderline wall thickening to 4 mm. No pericholecystic fluid. Common bile duct: Diameter: 6 mm Liver: Echogenic liver with pericholecystic sparing. Portal vein is patent on color Doppler imaging with normal direction of blood flow towards the liver. IMPRESSION: Cholelithiasis. Equivocal for cholecystitis, there is right upper quadrant tenderness and borderline gallbladder wall thickening  but no pericholecystic edema by CT or ultrasound. Electronically Signed   By: Tiburcio Pea M.D.   On: 11/18/2022 07:05   CT ABDOMEN PELVIS W CONTRAST  Result Date: 11/18/2022 CLINICAL DATA:  Acute, nonlocalized abdominal pain, right upper quadrant. Tenderness to palpation. EXAM: CT ABDOMEN AND PELVIS WITH CONTRAST TECHNIQUE: Multidetector CT imaging of the abdomen and pelvis was performed using the standard protocol following bolus administration of intravenous contrast. RADIATION DOSE REDUCTION: This exam was performed according to the departmental dose-optimization program which includes automated exposure control, adjustment of the mA and/or kV according to patient size and/or use of iterative reconstruction technique. CONTRAST:  75mL OMNIPAQUE IOHEXOL 350 MG/ML SOLN COMPARISON:  02/01/2013 FINDINGS: Lower chest: Biventricular pacer. Thinning of the left ventricular apex suggesting prior infarction. Coronary calcification. Hepatobiliary: No focal liver abnormality.Cholelithiasis. No evidence of acute cholecystitis. No biliary dilatation. Pancreas: Unremarkable. Spleen: Unremarkable. Adrenals/Urinary Tract: Negative adrenals. No hydronephrosis or stone. Mild renal cortical scarring bilaterally. Unremarkable bladder. Stomach/Bowel:  No obstruction. No appendicitis. Vascular/Lymphatic: No acute vascular abnormality. Scattered atheromatous calcification of the aorta. No mass or adenopathy. Reproductive:Posterior uterine fibroid measuring nearly 4 cm. Other: No ascites or pneumoperitoneum. Musculoskeletal: No acute abnormalities. Generalized lower thoracic and lumbar spine degeneration. IMPRESSION: 1. No acute finding. 2. Cholelithiasis without signs of acute cholecystitis. 3. Atherosclerosis.  Evidence of prior left ventricular infarct. Electronically Signed   By: Tiburcio Pea M.D.   On: 11/18/2022 05:49   DG Chest 2 View  Result Date: 11/18/2022 CLINICAL DATA:  Chest pain for 2 days, initial encounter  EXAM: CHEST - 2 VIEW COMPARISON:  11/09/2022 FINDINGS: Cardiac shadow is stable. Pacing device is again seen and stable. The lungs are clear. Visualized upper abdomen and bony structures are within normal limits. IMPRESSION: No acute abnormality noted. Electronically Signed   By: Alcide Clever M.D.   On: 11/18/2022 03:54    Pending Labs Unresulted Labs (From admission, onward)     Start     Ordered   11/19/22 0500  Comprehensive metabolic panel  Tomorrow morning,   R        11/18/22 0922   11/19/22 0500  CBC  Tomorrow morning,   R        11/18/22 0922            Vitals/Pain Today's Vitals   11/18/22 0945 11/18/22 1000 11/18/22 1036 11/18/22 1100  BP: 136/69 120/64  (!) 131/56  Pulse:      Resp: 20 19  16   Temp:   97.7 F (36.5 C)   TempSrc:   Oral   SpO2:      Weight:      Height:      PainSc:        Isolation Precautions No active isolations  Medications Medications  cefTRIAXone (ROCEPHIN) 2 g in sodium chloride 0.9 % 100 mL IVPB (0 g Intravenous Stopped 11/18/22 1112)  acetaminophen (TYLENOL) tablet 650 mg (650 mg Oral Given 11/18/22 1006)  HYDROmorphone (DILAUDID) injection 0.5 mg (has no administration in time range)  insulin aspart (novoLOG) injection 0-9 Units (has no administration in time range)  morphine (PF) 4 MG/ML injection 4 mg (4  mg Intravenous Given 11/18/22 0349)  ondansetron (ZOFRAN) injection 4 mg (4 mg Intravenous Given 11/18/22 0348)  morphine (PF) 4 MG/ML injection 4 mg (4 mg Intravenous Given 11/18/22 0504)  iohexol (OMNIPAQUE) 350 MG/ML injection 75 mL (75 mLs Intravenous Contrast Given 11/18/22 0544)  morphine (PF) 4 MG/ML injection 4 mg (4 mg Intravenous Given 11/18/22 6629)    Mobility walks     Focused Assessments Cardiac Assessment Handoff:    Lab Results  Component Value Date   CKTOTAL 154 03/05/2007   No results found for: "DDIMER" Does the Patient currently have chest pain? No    R Recommendations: See Admitting Provider  Note  Report given to:   Additional Notes:

## 2022-11-18 NOTE — H&P (Signed)
Hospital Admission History and Physical Service Pager: [phone]  Patient name: Mercedes Barr Medical record number: [id number] Date of Birth: [birth year] Age: 77 y.o. Gender: female  Primary Care Provider: Evelena Leyden, DO Consultants: Surgery  Code Status: DNR  Preferred Emergency Contact:   Contact Information     Name Relation Home Work [place] Son [phone]  [phone]   Mercedes Barr Daughter [phone]     Mercedes Barr   [phone]        Chief Complaint: Abdominal pain   Assessment and Plan: Mercedes Barr is a 77 y.o. female presenting with epigastric and RUQ abdominal pain.  Differential includes cholecysitis, cholelithiasis, acute cholangitis, pancreatitis, ACS.  Concern for ACS given epigastric pain, elevated troponins though EKG reassuring.  Pain likely due to cholecystitis vs cholelithiasis and surgery will perform lap chole.   PMH: complete heart block s/p pacemaker, a fib on Xeralto, CAD,COPD, GERD, DM2  * Abdominal pain Patient presents with epigastric and RUQ abdominal pain x1 day. No leukocytosis and vitals stable. Afebrile. RUQ [place] equivocal for cholecystitis, there is RUQ  tenderness and borderline gallbladder wall thickening but no pericholecystic edema by CT or ultrasound. CT abd pelv without acute findings but with cholelithiasis without signs of acute cholecystitis  On physical exam, significant abdominal tenderness in epigastric region and RUQ with positive Murphy sign. Surgery consulted and plans to do lap chole on 4/13. Will admit to help manage pain and chronic medical conditions  - admit to FPTS attending Dr. Pollie Barr  - surgery following, appreciate recommendations - med rec pending will add home meds after  - holding Xeralto for surgery.  - scheduled Tylenol  q 6 for mild pain, Dilaudid .  q4h prn for break through pain  - lap chole 4/13 - npo at MN - vitals per floor  - cardiac monitoring   Chronic  conditions DM2- A1c 9.1 3 mo ago. Glucose 182. Will add sliding scale coverage and home meds once med rec completed  A fib on Xeralto- last dose 4/11. Holding Xeralto for surgery Complete hart block s/p pacemaker  HTN- BP normotensive  Hypothyroidism- on Synthroid  GERD COPD CAD   FEN/GI: regular diet. NPO at midnight  VTE Prophylaxis: holding Xeralto  Disposition:  pending surgery  History of Present Illness:  Mercedes Barr is a 77 y.o. female presenting with abdominal pain and sternal discomfort since last night.   Last meal was at 6pm, and around 10pm starting having a sharp pain in epigastric area and right upper abdomen. Never had this pain before. Had a little bit of non bloody emesis yesterday. Normal Bms. Denies headaches   In the ED, CBC without leukocytosis, BMP unremarkable, LFTs unremarkabl,e troponin increased from 13 to 84, cards curbsided and third troponin ordered. Urinalysis pending, lipase within normal limits  Received morphine x 3 for pain  Review Of Systems: Per HPI   Pertinent Past Medical History: Mom- MI Dad- CVA   Pertinent Past Surgical History:  Pacemaker placement Total knee arthroplasty + revision Tubal ligation Cardiac cath AV cushion defect repair   Pertinent Social History: Tobacco use: No , has never smoked  Alcohol use: no  Other Substance use: no Lives with daughter   Pertinent Family History: Mom- MI Dad- CVA   Remainder reviewed in history tab.   Important Outpatient Medications: Xeralto  daily last took yesterday   Objective: BP (!) 131/56   Pulse (!) 59   Temp 97.7 F (36.5 C) (  Oral)   Resp 16   Ht 5\' 2"  (1.575 m)   Wt 65.8 kg   SpO2 94%   BMI 26.52 kg/m  Exam: General: laying in bed with eyes closed, NAD Eyes: EOMI. Normal conjunctiva  ENTM: MMM Neck: normal Cardiovascular: RRR Respiratory: CTAB normal WOB Gastrointestinal: soft, non distended. Significant tenderness to palpation in epigastric  region and RUQ. Positive Murphy sign MSK: moves extremities equally and spontaneously  Derm: warm, dry. No LE edema  Neuro: alert and oriented. No focal deficits Psych: mood and affect appropriate. Normal speech   Labs:  CBC BMET  Recent Labs  Lab 11/18/22 0245  WBC 8.9  HGB 13.7  HCT 41.6  PLT 295   Recent Labs  Lab 11/18/22 0245  NA 137  K 4.6  CL 102  CO2 23  BUN 12  CREATININE 0.82  GLUCOSE 182*  CALCIUM 8.6*     Pertinent additional labs Trop 13>84>102  EKG: Ventricularly paced rhythm without acute ischemic changes    Imaging Studies Performed:  US Abdomen Limited RUQ (LIVER/GB) Cholelithiasis. Equivocal for cholecystitis, there is right upper quadrant tenderness and borderline gallbladder wall thickening but no pericholecystic edema by CT or ultrasound.   CT ABDOMEN PELVIS W CONTRAST 1. No acute finding. 2. Cholelithiasis without signs of acute cholecystitis. 3. Atherosclerosis.  Evidence of prior left ventricular infarct.   DG Chest 2 View No acute abnormality noted.    Mercedes Collum, DO 11/18/2022, 11:24 AM PGY-3, [facility]  FPTS Intern pager: [phone], text pages welcome Secure chat group [facility] Teaching Service

## 2022-11-19 ENCOUNTER — Observation Stay (HOSPITAL_COMMUNITY): Payer: 59 | Admitting: Certified Registered"

## 2022-11-19 ENCOUNTER — Encounter (HOSPITAL_COMMUNITY): Payer: Self-pay | Admitting: Family Medicine

## 2022-11-19 ENCOUNTER — Other Ambulatory Visit: Payer: Self-pay

## 2022-11-19 ENCOUNTER — Encounter (HOSPITAL_COMMUNITY)
Admission: EM | Disposition: A | Discharge status: Home Health | Payer: Self-pay | Source: Home / Self Care | Attending: Family Medicine

## 2022-11-19 DIAGNOSIS — E119 Type 2 diabetes mellitus without complications: Secondary | ICD-10-CM

## 2022-11-19 DIAGNOSIS — J449 Chronic obstructive pulmonary disease, unspecified: Secondary | ICD-10-CM | POA: Diagnosis not present

## 2022-11-19 DIAGNOSIS — K810 Acute cholecystitis: Secondary | ICD-10-CM | POA: Diagnosis not present

## 2022-11-19 DIAGNOSIS — E876 Hypokalemia: Secondary | ICD-10-CM | POA: Insufficient documentation

## 2022-11-19 DIAGNOSIS — I10 Essential (primary) hypertension: Secondary | ICD-10-CM | POA: Diagnosis not present

## 2022-11-19 DIAGNOSIS — K660 Peritoneal adhesions (postprocedural) (postinfection): Secondary | ICD-10-CM | POA: Diagnosis not present

## 2022-11-19 HISTORY — PX: LAPAROSCOPIC LYSIS OF ADHESIONS: SHX5905

## 2022-11-19 HISTORY — PX: CHOLECYSTECTOMY: SHX55

## 2022-11-19 LAB — GLUCOSE, CAPILLARY
Glucose-Capillary: 142 mg/dL — ABNORMAL HIGH (ref 70–99)
Glucose-Capillary: 130 mg/dL — ABNORMAL HIGH (ref 70–99)
Glucose-Capillary: 112 mg/dL — ABNORMAL HIGH (ref 70–99)
Glucose-Capillary: 144 mg/dL — ABNORMAL HIGH (ref 70–99)
Glucose-Capillary: 148 mg/dL — ABNORMAL HIGH (ref 70–99)
Glucose-Capillary: 202 mg/dL — ABNORMAL HIGH (ref 70–99)
Glucose-Capillary: 234 mg/dL — ABNORMAL HIGH (ref 70–99)

## 2022-11-19 LAB — TROPONIN I (HIGH SENSITIVITY)
Troponin I (High Sensitivity): 47 ng/L — ABNORMAL HIGH (ref <18)
Troponin I (High Sensitivity): 68 ng/L — ABNORMAL HIGH (ref <18)
Troponin I (High Sensitivity): 58 ng/L — ABNORMAL HIGH (ref <18)
Troponin I (High Sensitivity): 56 ng/L — ABNORMAL HIGH (ref <18)

## 2022-11-19 LAB — COMPREHENSIVE METABOLIC PANEL
ALT: 26 U/L (ref 0–44)
AST: 21 U/L (ref 15–41)
Albumin: 3.2 g/dL — ABNORMAL LOW (ref 3.5–5.0)
Alkaline Phosphatase: 46 U/L (ref 38–126)
Anion gap: 12 (ref 5–15)
BUN: 14 mg/dL (ref 8–23)
CO2: 25 mmol/L (ref 22–32)
Calcium: 8.4 mg/dL — ABNORMAL LOW (ref 8.9–10.3)
Chloride: 100 mmol/L (ref 98–111)
Creatinine, Ser: 0.94 mg/dL (ref 0.44–1.00)
GFR, Estimated: >60 mL/min (ref >60)
Glucose, Bld: 222 mg/dL — ABNORMAL HIGH (ref 70–99)
Potassium: 3.4 mmol/L — ABNORMAL LOW (ref 3.5–5.1)
Sodium: 137 mmol/L (ref 135–145)
Total Bilirubin: 0.8 mg/dL (ref 0.3–1.2)
Total Protein: 6.5 g/dL (ref 6.5–8.1)

## 2022-11-19 LAB — CBC
HCT: 41.4 % (ref 36.0–46.0)
Hemoglobin: 12.9 g/dL (ref 12.0–15.0)
MCH: 27.3 pg (ref 26.0–34.0)
MCHC: 31.2 g/dL (ref 30.0–36.0)
MCV: 87.5 fL (ref 80.0–100.0)
Platelets: 201 10*3/uL (ref 150–400)
RBC: 4.73 MIL/uL (ref 3.87–5.11)
RDW: 13.8 % (ref 11.5–15.5)
WBC: 11.3 10*3/uL — ABNORMAL HIGH (ref 4.0–10.5)
nRBC: 0 % (ref 0.0–0.2)

## 2022-11-19 SURGERY — LAPAROSCOPIC CHOLECYSTECTOMY
Anesthesia: General

## 2022-11-19 MED ORDER — PROPRANOLOL HCL 40 MG PO TABS
40.0000 mg | ORAL_TABLET | Freq: Every day | ORAL | Status: DC
  Administered 2022-11-19 – 2022-11-21 (×3): 40 mg via ORAL
  Filled 2022-11-19 (×3): qty 1

## 2022-11-19 MED ORDER — BUPIVACAINE HCL (PF) 0.25 % IJ SOLN
INTRAMUSCULAR | Status: DC | PRN
  Administered 2022-11-19: 4 mL
  Administered 2022-11-19: 7 mL

## 2022-11-19 MED ORDER — PROPOFOL 10 MG/ML IV BOLUS
INTRAVENOUS | Status: DC | PRN
  Administered 2022-11-19: 60 mg via INTRAVENOUS
  Administered 2022-11-19: 100 mg via INTRAVENOUS

## 2022-11-19 MED ORDER — ONDANSETRON HCL 4 MG/2ML IJ SOLN
INTRAMUSCULAR | Status: DC | PRN
  Administered 2022-11-19: 4 mg via INTRAVENOUS

## 2022-11-19 MED ORDER — EPHEDRINE SULFATE-NACL 50-0.9 MG/10ML-% IV SOSY
PREFILLED_SYRINGE | INTRAVENOUS | Status: DC | PRN
  Administered 2022-11-19: 5 mg via INTRAVENOUS

## 2022-11-19 MED ORDER — PHENYLEPHRINE 80 MCG/ML (10ML) SYRINGE FOR IV PUSH (FOR BLOOD PRESSURE SUPPORT)
PREFILLED_SYRINGE | INTRAVENOUS | Status: DC | PRN
  Administered 2022-11-19 (×4): 80 ug via INTRAVENOUS

## 2022-11-19 MED ORDER — PHENYLEPHRINE 80 MCG/ML (10ML) SYRINGE FOR IV PUSH (FOR BLOOD PRESSURE SUPPORT)
PREFILLED_SYRINGE | INTRAVENOUS | Status: AC
  Filled 2022-11-19: qty 10

## 2022-11-19 MED ORDER — ONDANSETRON HCL 4 MG/2ML IJ SOLN: 4.0000 mg | Freq: Four times a day (QID) | INTRAMUSCULAR | Status: DC | PRN

## 2022-11-19 MED ORDER — INSULIN ASPART 100 UNIT/ML IJ SOLN
0.0000 [IU] | Freq: Every day | INTRAMUSCULAR | Status: DC
  Administered 2022-11-19: 2 [IU] via SUBCUTANEOUS

## 2022-11-19 MED ORDER — DEXAMETHASONE SODIUM PHOSPHATE 10 MG/ML IJ SOLN
INTRAMUSCULAR | Status: AC
  Filled 2022-11-19: qty 1

## 2022-11-19 MED ORDER — ONDANSETRON HCL 4 MG/2ML IJ SOLN
INTRAMUSCULAR | Status: AC
  Filled 2022-11-19: qty 2

## 2022-11-19 MED ORDER — ORAL CARE MOUTH RINSE
15.0000 mL | Freq: Once | OROMUCOSAL | Status: AC
  Administered 2022-11-19: 15 mL via OROMUCOSAL

## 2022-11-19 MED ORDER — INSULIN ASPART 100 UNIT/ML IJ SOLN
0.0000 [IU] | Freq: Three times a day (TID) | INTRAMUSCULAR | Status: DC
  Administered 2022-11-20 (×2): 2 [IU] via SUBCUTANEOUS
  Administered 2022-11-21 – 2022-11-22 (×4): 1 [IU] via SUBCUTANEOUS
  Administered 2022-11-23 (×3): 2 [IU] via SUBCUTANEOUS
  Administered 2022-11-24: 5 [IU] via SUBCUTANEOUS
  Administered 2022-11-24: 1 [IU] via SUBCUTANEOUS
  Administered 2022-11-24: 5 [IU] via SUBCUTANEOUS
  Administered 2022-11-25 (×2): 2 [IU] via SUBCUTANEOUS

## 2022-11-19 MED ORDER — INSULIN ASPART 100 UNIT/ML IJ SOLN: 0.0000 [IU] | INTRAMUSCULAR | Status: DC | PRN

## 2022-11-19 MED ORDER — CHLORHEXIDINE GLUCONATE 0.12 % MT SOLN: 15.0000 mL | Freq: Once | OROMUCOSAL | Status: AC

## 2022-11-19 MED ORDER — EPHEDRINE 5 MG/ML INJ
INTRAVENOUS | Status: AC
  Filled 2022-11-19: qty 5

## 2022-11-19 MED ORDER — SUGAMMADEX SODIUM 200 MG/2ML IV SOLN
INTRAVENOUS | Status: DC | PRN
  Administered 2022-11-19: 100 mg via INTRAVENOUS
  Administered 2022-11-19: 40 mg via INTRAVENOUS
  Administered 2022-11-19: 100 mg via INTRAVENOUS

## 2022-11-19 MED ORDER — LIDOCAINE 2% (20 MG/ML) 5 ML SYRINGE
INTRAMUSCULAR | Status: AC
  Filled 2022-11-19: qty 5

## 2022-11-19 MED ORDER — POTASSIUM CHLORIDE 10 MEQ/100ML IV SOLN
10.0000 meq | INTRAVENOUS | Status: AC
  Administered 2022-11-19 (×2): 10 meq via INTRAVENOUS
  Filled 2022-11-19 (×4): qty 100

## 2022-11-19 MED ORDER — FENTANYL CITRATE (PF) 100 MCG/2ML IJ SOLN
25.0000 ug | INTRAMUSCULAR | Status: DC | PRN
  Administered 2022-11-19: 25 ug via INTRAVENOUS

## 2022-11-19 MED ORDER — 0.9 % SODIUM CHLORIDE (POUR BTL) OPTIME
TOPICAL | Status: DC | PRN
  Administered 2022-11-19: 1000 mL

## 2022-11-19 MED ORDER — FENTANYL CITRATE (PF) 250 MCG/5ML IJ SOLN
INTRAMUSCULAR | Status: DC | PRN
  Administered 2022-11-19: 50 ug via INTRAVENOUS
  Administered 2022-11-19: 25 ug via INTRAVENOUS
  Administered 2022-11-19 (×2): 50 ug via INTRAVENOUS

## 2022-11-19 MED ORDER — PROPOFOL 10 MG/ML IV BOLUS
INTRAVENOUS | Status: AC
  Filled 2022-11-19: qty 20

## 2022-11-19 MED ORDER — BUPIVACAINE HCL (PF) 0.25 % IJ SOLN
INTRAMUSCULAR | Status: AC
  Filled 2022-11-19: qty 30

## 2022-11-19 MED ORDER — ASPIRIN 325 MG PO TBEC
325.0000 mg | DELAYED_RELEASE_TABLET | Freq: Once | ORAL | Status: AC
  Administered 2022-11-19: 325 mg via ORAL
  Filled 2022-11-19: qty 1

## 2022-11-19 MED ORDER — LACTATED RINGERS IV SOLN: INTRAVENOUS | Status: DC | PRN

## 2022-11-19 MED ORDER — LACTATED RINGERS IV SOLN: INTRAVENOUS | Status: DC

## 2022-11-19 MED ORDER — ROCURONIUM BROMIDE 10 MG/ML (PF) SYRINGE
PREFILLED_SYRINGE | INTRAVENOUS | Status: AC
  Filled 2022-11-19: qty 10

## 2022-11-19 MED ORDER — DEXAMETHASONE SODIUM PHOSPHATE 10 MG/ML IJ SOLN
INTRAMUSCULAR | Status: DC | PRN
  Administered 2022-11-19: 4 mg via INTRAVENOUS

## 2022-11-19 MED ORDER — SODIUM CHLORIDE 0.9 % IR SOLN
Status: DC | PRN
  Administered 2022-11-19: 1000 mL

## 2022-11-19 MED ORDER — ROCURONIUM BROMIDE 10 MG/ML (PF) SYRINGE
PREFILLED_SYRINGE | INTRAVENOUS | Status: DC | PRN
  Administered 2022-11-19: 5 mg via INTRAVENOUS
  Administered 2022-11-19: 50 mg via INTRAVENOUS

## 2022-11-19 MED ORDER — FENTANYL CITRATE (PF) 100 MCG/2ML IJ SOLN
INTRAMUSCULAR | Status: AC
  Filled 2022-11-19: qty 2

## 2022-11-19 MED ORDER — FENTANYL CITRATE (PF) 250 MCG/5ML IJ SOLN
INTRAMUSCULAR | Status: AC
  Filled 2022-11-19: qty 5

## 2022-11-19 MED ORDER — POVIDONE-IODINE 7.5 % EX SOLN
CUTANEOUS | Status: DC | PRN
  Filled 2022-11-19: qty 118

## 2022-11-19 MED ORDER — LIDOCAINE 2% (20 MG/ML) 5 ML SYRINGE
INTRAMUSCULAR | Status: DC | PRN
  Administered 2022-11-19: 50 mg via INTRAVENOUS
  Administered 2022-11-19: 100 mg via INTRAVENOUS

## 2022-11-19 MED ORDER — GLYCOPYRROLATE PF 0.2 MG/ML IJ SOSY
PREFILLED_SYRINGE | INTRAMUSCULAR | Status: AC
  Filled 2022-11-19: qty 1

## 2022-11-19 MED ORDER — ALBUTEROL SULFATE HFA 108 (90 BASE) MCG/ACT IN AERS
INHALATION_SPRAY | RESPIRATORY_TRACT | Status: DC | PRN
  Administered 2022-11-19 (×2): 8 via RESPIRATORY_TRACT

## 2022-11-19 SURGICAL SUPPLY — 41 items
ADH SKN CLS APL DERMABOND .7 (GAUZE/BANDAGES/DRESSINGS) ×2
APL PRP STRL LF DISP 70% ISPRP (MISCELLANEOUS)
APPLIER CLIP 5 13 M/L LIGAMAX5 (MISCELLANEOUS) ×2
APR CLP MED LRG 5 ANG JAW (MISCELLANEOUS) ×2
BAG COUNTER SPONGE SURGICOUNT (BAG) ×2 IMPLANT
BAG SPNG CNTER NS LX DISP (BAG) ×2
BLADE CLIPPER SURG (BLADE) IMPLANT
CANISTER SUCT 3000ML PPV (MISCELLANEOUS) ×2 IMPLANT
CHLORAPREP W/TINT 26 (MISCELLANEOUS) ×2 IMPLANT
CLIP APPLIE 5 13 M/L LIGAMAX5 (MISCELLANEOUS) ×2 IMPLANT
COVER SURGICAL LIGHT HANDLE (MISCELLANEOUS) ×2 IMPLANT
DERMABOND ADVANCED .7 DNX12 (GAUZE/BANDAGES/DRESSINGS) ×2 IMPLANT
DISSECTOR BLUNT TIP ENDO 5MM (MISCELLANEOUS) IMPLANT
DURAPREP 26ML APPLICATOR (WOUND CARE) IMPLANT
ELECT REM PT RETURN 9FT ADLT (ELECTROSURGICAL) ×2
ELECTRODE REM PT RTRN 9FT ADLT (ELECTROSURGICAL) ×2 IMPLANT
GLOVE BIO SURGEON STRL SZ7.5 (GLOVE) ×2 IMPLANT
GLOVE INDICATOR 8.0 STRL GRN (GLOVE) ×2 IMPLANT
GOWN STRL REUS W/ TWL LRG LVL3 (GOWN DISPOSABLE) ×4 IMPLANT
GOWN STRL REUS W/ TWL XL LVL3 (GOWN DISPOSABLE) ×2 IMPLANT
GOWN STRL REUS W/TWL LRG LVL3 (GOWN DISPOSABLE) ×4
GOWN STRL REUS W/TWL XL LVL3 (GOWN DISPOSABLE) ×2
HEMOSTAT SNOW SURGICEL 2X4 (HEMOSTASIS) IMPLANT
IRRIG SUCT STRYKERFLOW 2 WTIP (MISCELLANEOUS) ×2
IRRIGATION SUCT STRKRFLW 2 WTP (MISCELLANEOUS) ×2 IMPLANT
KIT BASIN OR (CUSTOM PROCEDURE TRAY) ×2 IMPLANT
KIT TURNOVER KIT B (KITS) ×2 IMPLANT
NS IRRIG 1000ML POUR BTL (IV SOLUTION) ×2 IMPLANT
PAD ARMBOARD 7.5X6 YLW CONV (MISCELLANEOUS) ×2 IMPLANT
PENCIL BUTTON HOLSTER BLD 10FT (ELECTRODE) ×2 IMPLANT
SCISSORS LAP 5X35 DISP (ENDOMECHANICALS) ×2 IMPLANT
SET TUBE SMOKE EVAC HIGH FLOW (TUBING) ×2 IMPLANT
SUT MNCRL AB 4-0 PS2 18 (SUTURE) ×2 IMPLANT
SYS BAG RETRIEVAL 10MM (BASKET)
SYSTEM BAG RETRIEVAL 10MM (BASKET) IMPLANT
TOWEL GREEN STERILE FF (TOWEL DISPOSABLE) ×2 IMPLANT
TRAY LAPAROSCOPIC MC (CUSTOM PROCEDURE TRAY) ×2 IMPLANT
TROCAR ADV FIXATION 5X100MM (TROCAR) ×6 IMPLANT
TROCAR BALLN 12MMX100 BLUNT (TROCAR) ×2 IMPLANT
WARMER LAPAROSCOPE (MISCELLANEOUS) ×2 IMPLANT
WATER STERILE IRR 1000ML POUR (IV SOLUTION) ×2 IMPLANT

## 2022-11-19 NOTE — Progress Notes (Signed)
Went to see patient earlier this afternoon to check in after she had her surgery.  She complained of sternal chest pain which she described as a dull ache.  Of note, she had chest pain previously thought to be related to acute cholecystitis.  She did have multiple runs of VT postop today.   She was breathing comfortably on 1 L O2 [place] (which she prefers to stay on for comfort at this time). Irregular rhythm, regular rate on auscultation   EKG obtained concerning for wide inverted T waves -not seen on EKG yesterday.  Troponin obtained and decreased from yesterday, 93>47.  I contacted cardiology fellow who recommended repeating EKG in 2 hours along with repeat troponins and starting ASA 325 mg today while continuing to hold Eliquis postop. Also recommended getting an echo. Can reduce to ASA 81 mg or start back Eliquis tomorrow if appropriate.    Chest pain -Follow-up EKG and tropes -ASA 325 mg once -f/u echo

## 2022-11-19 NOTE — Progress Notes (Addendum)
FMTS Attending Daily Note:  Levert Feinstein MD Personal pager:  [phone] FPTS Service Pager:  [phone]  I have reviewed this patient's chart and I have discussed this patient with the resident.  I agree with their findings, assessment and care plan. Unable to see patient myself today as she was in the OR when I went to her room. Appreciate general surgery management. ----------------------------------      Daily Progress Note Intern Pager: [phone]  Patient name: Mercedes Barr Medical record number: [id number] Date of birth: 1946-01-24 Age: 77 y.o. Gender: female  Primary Care Provider: Evelena Leyden, DO Consultants: General Surgery, Cardiology Code Status: DNR  Pt Overview and Major Events to Date:  4/12: Admitted 4/13: Scheduled for laparoscopic cholecystectomy  Assessment and Plan: MANIKA GOLISH is a 77 y.o. female presenting with epigastric and RUQ abdominal pain, found to have likely acute cholecystitis. Pertinent PMH/PSH includes f complete heart block status post pacemaker, asthma, HFpEF, COPD, diabetes, GERD, hypertension, hypothyroidism, atrial fibrillation, glaucoma .   * Abdominal pain Patient scheduled to have laparoscopic cholecystectomy this morning at 7:15 AM with general surgery.  Vital signs stable this morning. Cardiology has cleared her as low risk for surgery. Xarelto has been held, per cardiology.  Will restart this once safe from surgical point of view and cardiology will follow postoperative. We will do a postoperative check later in the day and manage pain. -Hold Xarelto for surgery -Resume home meds when med rec complete - scheduled Tylenol 650mg  q 6 for mild pain, Dilaudid .5mg  q4h prn for break through pain (can add oxycodone 5 mg for mild/moderate pain s/p surgery) - lap chole 4/13 -Will resume diet when appropriate s/p surgery  Hypokalemia K +3.4 this morning.  Repleted with 4 runs KCl 10 mEq equivalents - BMP in AM   Chronic  conditions DM2- A1c 9.1 3 mo ago. Glucose 182. Will add sliding scale coverage s/p lap chole A fib on Xeralto- last dose 4/11. Holding Xarelto, resume after surgery HTN- BP normotensive  Hypothyroidism- on Synthroid  GERD COPD CAD Complete hart block s/p pacemaker    FEN/GI: N.p.o., resume diet later today PPx: Holding Xarelto, resume later today Dispo: Pending PT/OT recommendations  Subjective:  Patient pleasant and awake and alert this morning.  RN at bedside providing preoperative cleansing. Denies any further episodes of chest pain.  On the phone with her daughter letting her know that she is can have her surgery shortly. Patient has no questions or concerns for me  Objective: Temp:  [97.7 F (36.5 C)-98.7 F (37.1 C)] 98.7 F (37.1 C) (04/13 0340) Pulse Rate:  [59-68] 64 (04/13 0340) Resp:  [15-24] 15 (04/13 0340) BP: (102-140)/(49-115) 118/49 (04/13 0340) SpO2:  [93 %-98 %] 97 % (04/13 0340) Weight:  [60 kg-64.6 kg] 60 kg (04/13 0340) Physical Exam: General: Resting comfortably in bed in no distress Cardiovascular: Regular rate and rhythm Respiratory: Clear in all fields Abdomen: Soft, tender to palpation especially in epigastric region and right upper quadrant  Extremities: Warm, well-perfused and no edema  Laboratory: Most recent CBC Lab Results  Component Value Date   WBC 11.3 (H) 11/19/2022   HGB 12.9 11/19/2022   HCT 41.4 11/19/2022   MCV 87.5 11/19/2022   PLT 201 11/19/2022   Most recent BMP    Latest Ref Rng & Units 11/19/2022   12:35 AM  BMP  Glucose 70 - 99 mg/dL 115   BUN 8 - 23 mg/dL 14   Creatinine 5.20 - 1.00  mg/dL 1.61   Sodium 096 - 045 mmol/L 137   Potassium 3.5 - 5.1 mmol/L 3.4   Chloride 98 - 111 mmol/L 100   CO2 22 - 32 mmol/L 25   Calcium 8.9 - 10.3 mg/dL 8.4      Darral Dash, DO 11/19/2022, 6:25 AM  PGY-2, [facility] FPTS Intern pager: [phone], text pages welcome Secure chat group [facility] Teaching Service

## 2022-11-19 NOTE — Plan of Care (Signed)
  Pt had pain in her chest that she said was more a surgical pain but declined pain medication. Messaged MD for HS sliding scale coverage, cbgs has been in the 200s. Also had to message MD for nausea medication. Had zofran and was unable to take her 0400 Tylenol and AM thyroid med. Pt said she can take meds when she feels better.   Problem: Education: Goal: Ability to describe self-care measures that may prevent or decrease complications (Diabetes Survival Skills Education) will improve Outcome: Progressing Goal: Individualized Educational Video(s) Outcome: Progressing   Problem: Coping: Goal: Ability to adjust to condition or change in health will improve Outcome: Progressing   Problem: Fluid Volume: Goal: Ability to maintain a balanced intake and output will improve Outcome: Progressing   Problem: Health Behavior/Discharge Planning: Goal: Ability to identify and utilize available resources and services will improve Outcome: Progressing Goal: Ability to manage health-related needs will improve Outcome: Progressing   Problem: Metabolic: Goal: Ability to maintain appropriate glucose levels will improve Outcome: Progressing   Problem: Nutritional: Goal: Maintenance of adequate nutrition will improve Outcome: Progressing Goal: Progress toward achieving an optimal weight will improve Outcome: Progressing   Problem: Skin Integrity: Goal: Risk for impaired skin integrity will decrease Outcome: Progressing   Problem: Tissue Perfusion: Goal: Adequacy of tissue perfusion will improve Outcome: Progressing   Problem: Education: Goal: Knowledge of General Education information will improve Description: Including pain rating scale, medication(s)/side effects and non-pharmacologic comfort measures Outcome: Progressing   Problem: Health Behavior/Discharge Planning: Goal: Ability to manage health-related needs will improve Outcome: Progressing   Problem: Clinical Measurements: Goal:  Ability to maintain clinical measurements within normal limits will improve Outcome: Progressing Goal: Will remain free from infection Outcome: Progressing Goal: Diagnostic test results will improve Outcome: Progressing Goal: Respiratory complications will improve Outcome: Progressing Goal: Cardiovascular complication will be avoided Outcome: Progressing   Problem: Activity: Goal: Risk for activity intolerance will decrease Outcome: Progressing   Problem: Nutrition: Goal: Adequate nutrition will be maintained Outcome: Progressing   Problem: Coping: Goal: Level of anxiety will decrease Outcome: Progressing   Problem: Elimination: Goal: Will not experience complications related to bowel motility Outcome: Progressing Goal: Will not experience complications related to urinary retention Outcome: Progressing   Problem: Pain Managment: Goal: General experience of comfort will improve Outcome: Progressing   Problem: Safety: Goal: Ability to remain free from injury will improve Outcome: Progressing   Problem: Skin Integrity: Goal: Risk for impaired skin integrity will decrease Outcome: Progressing

## 2022-11-19 NOTE — Assessment & Plan Note (Signed)
K +3.4 this morning.  Repleted with 4 runs KCl 10 mEq equivalents - BMP in AM

## 2022-11-19 NOTE — Progress Notes (Signed)
Subjective No acute events. Still with RUQ pain. No n/v.  Objective: Vital signs in last 24 hours: Temp:  [97.7 F (36.5 C)-98.7 F (37.1 C)] 98.7 F (37.1 C) (04/13 0340) Pulse Rate:  [59-68] 64 (04/13 0340) Resp:  [15-24] 15 (04/13 0340) BP: (102-140)/(49-115) 118/49 (04/13 0340) SpO2:  [93 %-98 %] 97 % (04/13 0340) Weight:  [60 kg-64.6 kg] 60 kg (04/13 0340) Last BM Date : 11/17/22  Intake/Output from previous day: 04/12 0701 - 04/13 0700 In: 1061.1 [P.O.:960; IV Piggyback:101.1] Out: 350 [Urine:350] Intake/Output this shift: No intake/output data recorded.  Gen: NAD, comfortable CV: RRR Pulm: Normal work of breathing Abd: Soft, focally tender in RUQ; nondistended Ext: SCDs in place  Lab Results: CBC  Recent Labs    11/18/22 0245 11/19/22 0035  WBC 8.9 11.3*  HGB 13.7 12.9  HCT 41.6 41.4  PLT 295 201   BMET Recent Labs    11/18/22 0245 11/19/22 0035  NA 137 137  K 4.6 3.4*  CL 102 100  CO2 23 25  GLUCOSE 182* 222*  BUN 12 14  CREATININE 0.82 0.94  CALCIUM 8.6* 8.4*   PT/INR No results for input(s): "LABPROT", "INR" in the last 72 hours. ABG No results for input(s): "PHART", "HCO3" in the last 72 hours.  Invalid input(s): "PCO2", "PO2"  Studies/Results:  Anti-infectives: Anti-infectives (From admission, onward)    Start     Dose/Rate Route Frequency Ordered Stop   11/18/22 1000  cefTRIAXone (ROCEPHIN) 2 g in sodium chloride 0.9 % 100 mL IVPB        2 g 200 mL/hr over 30 Minutes Intravenous Every 24 hours 11/18/22 0834          Assessment/Plan: Patient Active Problem List   Diagnosis Date Noted   Hypokalemia 11/19/2022   Abdominal pain 11/18/2022   Flank pain 11/02/2022   Greater trochanteric pain syndrome of left lower extremity 09/07/2022   Arthritis, midfoot 07/06/2022   Osteoporosis 04/03/2019   Epigastric pain 01/31/2019   Hematuria 12/27/2018   Gross hematuria 12/13/2018   Rotator cuff tendinitis, right 09/20/2017    Persistent atrial fibrillation 08/17/2017   Tremor of left hand 06/12/2015   (HFpEF) heart failure with preserved ejection fraction 05/21/2015   Difficulty walking 04/27/2015   Right hip pain 04/08/2015   Left shoulder pain 12/01/2014   Osteoarthritis, multiple sites 11/20/2014   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   Complete heart block    Normal coronary arteries-Feb 2011    Pacemaker    Weight loss, unintentional 12/21/2012   Osteoarthritis of right knee 08/22/2012   Insomnia 03/08/2012   Allergy to bee sting 10/04/2011   Low back pain 09/07/2011   Hypothyroidism 12/28/2009   GLAUCOMA 12/25/2009   GERD 01/19/2009   COPD (chronic obstructive pulmonary disease) 11/06/2008   Abnormal involuntary movement 09/03/2008   Essential hypertension 06/14/2007   DM (diabetes mellitus), type 2 11/06/2006   DEPRESSION 11/06/2006   Hyperlipidemia 11/03/2006   Acute cholecystitis  -Cleared by cardiology  -The anatomy and physiology of the hepatobiliary system was discussed with the patient. The pathophysiology of gallbladder disease was reviewed as well. -The options for treatment were discussed including ongoing observation which may result in subsequent gallbladder complications (infection, pancreatitis, choledocholithiasis, etc), drainage procedures, and surgery - laparoscopic cholecystectomy -The planned procedure, material risks (including, but not limited to, pain, bleeding, infection, scarring, need for blood transfusion, damage to surrounding structures- blood vessels/nerves/viscus/organs, damage to bile duct, bile leak, conversion to a 'subtotal' cholecystectomy  and general expectations therein, post-cholecystectomy diarrhea, potential need for additional procedures including EGD/ERCP, hernia, worsening of pre-existing medical conditions, pancreatitis, pneumonia, heart attack, stroke, death) benefits and alternatives to surgery were discussed at length. We have noted a good  probability that the procedure would help improve their symptoms. The patient's questions were answered to her satisfaction, she voiced understanding and elected to proceed with surgery. Additionally, we discussed typical postoperative expectations and the recovery process.   LOS: 0 days   I spent a total of 35 minutes in both face-to-face and non-face-to-face activities, excluding procedures performed, for this visit on the date of this encounter.  Marin Olp, MD [facility], [facility]

## 2022-11-19 NOTE — Anesthesia Preprocedure Evaluation (Signed)
Anesthesia Evaluation  Patient identified by MRN, date of birth, ID band Patient awake    Reviewed: Allergy & Precautions, H&P , NPO status , Patient's Chart, lab work & pertinent test results  Airway Mallampati: II   Neck ROM: full    Dental   Pulmonary shortness of breath, asthma , COPD   breath sounds clear to auscultation       Cardiovascular hypertension, + dysrhythmias + pacemaker  Rhythm:regular Rate:Normal  TTE (03/2021): EF 55%, normal vlalves.  Pacemaker dependent, complete heart block underlying.   Neuro/Psych  PSYCHIATRIC DISORDERS  Depression       GI/Hepatic ,GERD  ,,  Endo/Other  diabetes, Type 2Hypothyroidism    Renal/GU      Musculoskeletal  (+) Arthritis ,    Abdominal   Peds  Hematology   Anesthesia Other Findings   Reproductive/Obstetrics                             Anesthesia Physical Anesthesia Plan  ASA: 3  Anesthesia Plan: General   Post-op Pain Management:    Induction: Intravenous  PONV Risk Score and Plan: 3 and Ondansetron, Dexamethasone and Treatment may vary due to age or medical condition  Airway Management Planned: Oral ETT  Additional Equipment:   Intra-op Plan:   Post-operative Plan: Extubation in OR  Informed Consent: I have reviewed the patients History and Physical, chart, labs and discussed the procedure including the risks, benefits and alternatives for the proposed anesthesia with the patient or authorized representative who has indicated his/her understanding and acceptance.     Dental advisory given  Plan Discussed with: CRNA, Anesthesiologist and Surgeon  Anesthesia Plan Comments:        Anesthesia Quick Evaluation

## 2022-11-19 NOTE — Op Note (Signed)
11/18/2022 - 11/19/2022 9:55 AM  PATIENT: Mercedes Barr  77 y.o. female  Patient Care Team: Evelena Leyden, DO as PCP - General (Family Medicine) Croitoru, Rachelle Hora, MD as PCP - Cardiology (Cardiology) Croitoru, Rachelle Hora, MD as PCP - Electrophysiology (Cardiology) Ned Card, DMD (Dentistry) Jethro Bolus, MD as Consulting Physician (Ophthalmology) Croitoru, Rachelle Hora, MD as Consulting Physician (Cardiology)  PRE-OPERATIVE DIAGNOSIS: Acute cholecystitis  POST-OPERATIVE DIAGNOSIS: Same  PROCEDURE: Laparoscopic cholecystectomy with lysis of adhesions x 40 minutes  SURGEON: Marin Olp, MD  ASSISTANT: OR staff  ANESTHESIA: General endotracheal  EBL: 10 mL  DRAINS: None  SPECIMEN: Gallbladder  COUNTS: Sponge, needle and instrument counts were reported correct x2 at the conclusion of the operation  DISPOSITION: PACU in satisfactory condition  COMPLICATIONS: None  FINDINGS: Adhesions across the left hemiabdomen likely related to prior surgery.  These consisted of omentum.  Adhesions to gallbladder partially obstructing view of gallbladder.  Acutely inflamed tense appearing gallbladder with thickened wall.  DESCRIPTION:   The patient was identified & brought into the operating room.  She was then positioned supine on the OR table. SCDs were in place and active during the entire case.  She then underwent general endotracheal anesthesia. Pressure points were padded. Hair on the abdomen was clipped by the OR team. The abdomen was prepped and draped in the standard sterile fashion. Antibiotics were administered. A surgical timeout was performed and confirmed our plan.   A periumbilical incision was made. The umbilical stalk was grasped and retracted outwardly. The supraumbilical fascia was identified and incised. The peritoneal cavity was gently entered bluntly. A purse-string 0 Vicryl suture was placed. The Hasson cannula was inserted into the peritoneal cavity and insufflation with  CO2 commenced to . A laparoscope was inserted into the peritoneal cavity and inspection confirmed no evidence of trocar site complications. The patient was then positioned in reverse Trendelenburg with slight left side down. 3 additional 43mm trocars were placed along the right subcostal line - one 81mm port in mid subcostal region, another 33mm port in the right flank near the anterior axillary line, and a third 37mm port in the left subxiphoid region obliquely near the falciform ligament.  There were a fair amount of adhesions across her right hemiabdomen likely related to prior surgery.  These primarily consisted of omental containing adhesions to the abdominal wall.  These were all lysed sharply to facilitate exposure of the gallbladder.  There were also omental containing adhesions to the gallbladder that precluded visualization.  There were also omental containing adhesions to the liver that had to be lysed in order to facilitate complete gallbladder exposure.  Total adhesiolysis time was approximately 40 minutes.  The liver and gallbladder were inspected.  The liver has blunted edges.  The gallbladder is acutely inflamed in appearance with wall thickening.  It is tense.  In order to facilitate grasping of the gallbladder, a Nezhat was used to decompress the gallbladder.  Dark green fluid was aspirated. The gallbladder fundus was grasped and elevated cephalad. An additional grasper was then placed on the infundibulum of the gallbladder and the infundibulum was retracted laterally. Staying high on the gallbladder, the peritoneum on both sides of the gallbladder was opened with hook cautery. Gentle blunt dissection was then employed with a Art gallery manager working down into [employer]. The cystic duct was identified and carefully circumferentially dissected. The cystic artery was also identified and carefully circumferentially dissected. The space between the cystic artery and hepatocystic plate  was developed such that a  good view of the liver could be seen through a window medial to the cystic artery. The triangle of Calot had been cleared of all fibrofatty tissue. At this point, a critical view of safety was achieved and the only structures visualized was the skeletonized cystic duct laterally, the skeletonized cystic artery and the liver through the window medial to the artery. No posterior cystic artery was noted  A cholangiogram was not performed as her cystic duct is quite diminutive in size and she has overall quite fragile tissue.  We were concerned that we could either create a back wall type injury or lose control of the cystic duct.  The cystic duct and artery were clipped with 2 clips on the patient side and 1 clip on the specimen side. The cystic duct and artery were then divided. The gallbladder was then freed from its remaining attachments to the liver using electrocautery and placed into an endocatch bag. The RUQ was gently irrigated with sterile saline. Hemostasis was then verified. The clips were in good position; the gallbladder fossa was dry. Surgicel snow was placed in the gallbladder fossa. The rest of the abdomen was inspected no injury nor bleeding elsewhere was identified.  The endocatch bag containing the gallbladder was then removed from the umbilical port site and passed off as specimen. The RUQ ports were removed under direct visualization and noted to be hemostatic. The umbilical fascia was then closed using the 0 Vicryl purse-string suture. The fascia was palpated and noted to be completely closed. The skin of all incision sites was approximated with 4-0 monocryl subcuticular suture and dermabond applied.  She was then awakened from anesthesia, extubated, and transferred to a stretcher for transport to PACU in satisfactory condition.

## 2022-11-19 NOTE — Progress Notes (Signed)
FMTS Interim Progress Note  S: Went to bedside to evaluate patient, reports that she is still having chest pain but is improved now 5/10 in severity.  She has not gotten out of bed much since her surgery this morning.  O: BP 134/66 (BP Location: Right Arm)   Pulse 60   Temp 98.2 F (36.8 C) (Oral)   Resp 17   Ht 5' 2.01" (1.575 m)   Wt 60 kg   SpO2 97%   BMI 24.19 kg/m   General: Resting comfortably in bed watching television Cardiovascular: RRR, no murmurs.  Chest pain reproducible on palpation  A/P: Chest pain Improving, could be MSK component given reproducibility.  Repeat EKG reviewed, still shows wide inverted T waves most prominent in leads I and II but appears similar to prior EKG earlier in the day.  Troponin has peaked, will stop trending for now.  Echocardiogram is still pending. No change to current plan.  Littie Deeds, MD 11/19/2022, 10:05 PM PGY-3, [facility] Service pager [phone]

## 2022-11-19 NOTE — Transfer of Care (Signed)
Immediate Anesthesia Transfer of Care Note  Patient: PRAGYA BIRKES  Procedure(s) Performed: LAPAROSCOPIC CHOLECYSTECTOMY LAPAROSCOPIC LYSIS OF ADHESIONS  Patient Location: PACU  Anesthesia Type:General  Level of Consciousness: sedated and drowsy  Airway & Oxygen Therapy: Patient Spontanous Breathing and Patient connected to face mask oxygen  Post-op Assessment: Report given to RN and Post -op Vital signs reviewed and stable  Post vital signs: Reviewed and stable  Last Vitals:  Vitals Value Taken Time  BP 123/65 11/19/22 1015  Temp 37.1 C 11/19/22 1010  Pulse 60 11/19/22 1020  Resp 22 11/19/22 1020  SpO2 96 % 11/19/22 1020  Vitals shown include unvalidated device data.  Last Pain:  Vitals:   11/19/22 0736  TempSrc: Oral  PainSc:       Patients Stated Pain Goal: 0 (11/18/22 1400)  Complications: No notable events documented.

## 2022-11-19 NOTE — Anesthesia Procedure Notes (Signed)
Procedure Name: Intubation Date/Time: 11/19/2022 8:27 AM  Performed by: Wilder Glade, CRNAPre-anesthesia Checklist: Patient identified, Emergency Drugs available, Suction available, Patient being monitored and Timeout performed Patient Re-evaluated:Patient Re-evaluated prior to induction Oxygen Delivery Method: Circle system utilized Preoxygenation: Pre-oxygenation with 100% oxygen Induction Type: IV induction Ventilation: Mask ventilation without difficulty Laryngoscope Size: Miller and 2 Grade View: Grade I Tube type: Oral Tube size: 7.0 mm Number of attempts: 1 Airway Equipment and Method: Stylet Placement Confirmation: ETT inserted through vocal cords under direct vision, positive ETCO2 and breath sounds checked- equal and bilateral Secured at: 20 cm Tube secured with: Tape Dental Injury: Teeth and Oropharynx as per pre-operative assessment  Comments: Brief atraumatic dentition unchanged vss

## 2022-11-20 ENCOUNTER — Encounter (HOSPITAL_COMMUNITY)
Admission: EM | Disposition: A | Discharge status: Home Health | Payer: Self-pay | Source: Home / Self Care | Attending: Family Medicine

## 2022-11-20 ENCOUNTER — Other Ambulatory Visit (HOSPITAL_COMMUNITY): Payer: 59

## 2022-11-20 ENCOUNTER — Observation Stay (HOSPITAL_COMMUNITY): Payer: 59

## 2022-11-20 ENCOUNTER — Encounter (HOSPITAL_COMMUNITY): Payer: Self-pay | Admitting: Surgery

## 2022-11-20 DIAGNOSIS — I959 Hypotension, unspecified: Secondary | ICD-10-CM | POA: Diagnosis not present

## 2022-11-20 DIAGNOSIS — I442 Atrioventricular block, complete: Secondary | ICD-10-CM | POA: Diagnosis present

## 2022-11-20 DIAGNOSIS — K8012 Calculus of gallbladder with acute and chronic cholecystitis without obstruction: Secondary | ICD-10-CM | POA: Diagnosis present

## 2022-11-20 DIAGNOSIS — I700 Atherosclerosis of aorta: Secondary | ICD-10-CM | POA: Diagnosis present

## 2022-11-20 DIAGNOSIS — R079 Chest pain, unspecified: Secondary | ICD-10-CM

## 2022-11-20 DIAGNOSIS — R1013 Epigastric pain: Secondary | ICD-10-CM | POA: Diagnosis present

## 2022-11-20 DIAGNOSIS — R109 Unspecified abdominal pain: Secondary | ICD-10-CM | POA: Diagnosis not present

## 2022-11-20 DIAGNOSIS — E782 Mixed hyperlipidemia: Secondary | ICD-10-CM | POA: Diagnosis not present

## 2022-11-20 DIAGNOSIS — I4892 Unspecified atrial flutter: Secondary | ICD-10-CM | POA: Diagnosis present

## 2022-11-20 DIAGNOSIS — K819 Cholecystitis, unspecified: Secondary | ICD-10-CM | POA: Diagnosis not present

## 2022-11-20 DIAGNOSIS — Z794 Long term (current) use of insulin: Secondary | ICD-10-CM | POA: Diagnosis not present

## 2022-11-20 DIAGNOSIS — Z66 Do not resuscitate: Secondary | ICD-10-CM | POA: Diagnosis present

## 2022-11-20 DIAGNOSIS — I5043 Acute on chronic combined systolic (congestive) and diastolic (congestive) heart failure: Secondary | ICD-10-CM | POA: Diagnosis present

## 2022-11-20 DIAGNOSIS — I25110 Atherosclerotic heart disease of native coronary artery with unstable angina pectoris: Secondary | ICD-10-CM | POA: Diagnosis present

## 2022-11-20 DIAGNOSIS — R9431 Abnormal electrocardiogram [ECG] [EKG]: Secondary | ICD-10-CM

## 2022-11-20 DIAGNOSIS — I130 Hypertensive heart and chronic kidney disease with heart failure and stage 1 through stage 4 chronic kidney disease, or unspecified chronic kidney disease: Secondary | ICD-10-CM | POA: Diagnosis present

## 2022-11-20 DIAGNOSIS — I5181 Takotsubo syndrome: Secondary | ICD-10-CM | POA: Diagnosis not present

## 2022-11-20 DIAGNOSIS — I4729 Other ventricular tachycardia: Secondary | ICD-10-CM | POA: Diagnosis not present

## 2022-11-20 DIAGNOSIS — I472 Ventricular tachycardia, unspecified: Secondary | ICD-10-CM | POA: Diagnosis not present

## 2022-11-20 DIAGNOSIS — G8929 Other chronic pain: Secondary | ICD-10-CM | POA: Diagnosis present

## 2022-11-20 DIAGNOSIS — F32A Depression, unspecified: Secondary | ICD-10-CM | POA: Diagnosis present

## 2022-11-20 DIAGNOSIS — I4811 Longstanding persistent atrial fibrillation: Secondary | ICD-10-CM | POA: Diagnosis present

## 2022-11-20 DIAGNOSIS — E785 Hyperlipidemia, unspecified: Secondary | ICD-10-CM | POA: Diagnosis present

## 2022-11-20 DIAGNOSIS — E0590 Thyrotoxicosis, unspecified without thyrotoxic crisis or storm: Secondary | ICD-10-CM | POA: Diagnosis present

## 2022-11-20 DIAGNOSIS — E039 Hypothyroidism, unspecified: Secondary | ICD-10-CM | POA: Diagnosis present

## 2022-11-20 DIAGNOSIS — J4489 Other specified chronic obstructive pulmonary disease: Secondary | ICD-10-CM | POA: Diagnosis present

## 2022-11-20 DIAGNOSIS — I2489 Other forms of acute ischemic heart disease: Secondary | ICD-10-CM | POA: Diagnosis present

## 2022-11-20 DIAGNOSIS — E861 Hypovolemia: Secondary | ICD-10-CM | POA: Diagnosis present

## 2022-11-20 DIAGNOSIS — E119 Type 2 diabetes mellitus without complications: Secondary | ICD-10-CM | POA: Diagnosis present

## 2022-11-20 DIAGNOSIS — N179 Acute kidney failure, unspecified: Secondary | ICD-10-CM | POA: Diagnosis present

## 2022-11-20 DIAGNOSIS — G250 Essential tremor: Secondary | ICD-10-CM | POA: Diagnosis present

## 2022-11-20 DIAGNOSIS — E876 Hypokalemia: Secondary | ICD-10-CM | POA: Diagnosis present

## 2022-11-20 HISTORY — PX: LEFT HEART CATH AND CORONARY ANGIOGRAPHY: CATH118249

## 2022-11-20 LAB — COMPREHENSIVE METABOLIC PANEL
ALT: 33 U/L (ref 0–44)
AST: 43 U/L — ABNORMAL HIGH (ref 15–41)
Albumin: 3 g/dL — ABNORMAL LOW (ref 3.5–5.0)
Alkaline Phosphatase: 47 U/L (ref 38–126)
Anion gap: 9 (ref 5–15)
BUN: 18 mg/dL (ref 8–23)
CO2: 25 mmol/L (ref 22–32)
Calcium: 8.1 mg/dL — ABNORMAL LOW (ref 8.9–10.3)
Chloride: 101 mmol/L (ref 98–111)
Creatinine, Ser: 1.02 mg/dL — ABNORMAL HIGH (ref 0.44–1.00)
GFR, Estimated: 57 mL/min — ABNORMAL LOW (ref >60)
Glucose, Bld: 210 mg/dL — ABNORMAL HIGH (ref 70–99)
Potassium: 4.2 mmol/L (ref 3.5–5.1)
Sodium: 135 mmol/L (ref 135–145)
Total Bilirubin: 0.6 mg/dL (ref 0.3–1.2)
Total Protein: 6.6 g/dL (ref 6.5–8.1)

## 2022-11-20 LAB — TROPONIN I (HIGH SENSITIVITY)
Troponin I (High Sensitivity): 43 ng/L — ABNORMAL HIGH (ref <18)
Troponin I (High Sensitivity): 91 ng/L — ABNORMAL HIGH (ref <18)

## 2022-11-20 LAB — GLUCOSE, CAPILLARY
Glucose-Capillary: 173 mg/dL — ABNORMAL HIGH (ref 70–99)
Glucose-Capillary: 162 mg/dL — ABNORMAL HIGH (ref 70–99)
Glucose-Capillary: 105 mg/dL — ABNORMAL HIGH (ref 70–99)

## 2022-11-20 LAB — CBC
HCT: 42.5 % (ref 36.0–46.0)
Hemoglobin: 13 g/dL (ref 12.0–15.0)
MCH: 26.9 pg (ref 26.0–34.0)
MCHC: 30.6 g/dL (ref 30.0–36.0)
MCV: 87.8 fL (ref 80.0–100.0)
Platelets: 239 10*3/uL (ref 150–400)
RBC: 4.84 MIL/uL (ref 3.87–5.11)
RDW: 13.7 % (ref 11.5–15.5)
WBC: 9.8 10*3/uL (ref 4.0–10.5)
nRBC: 0 % (ref 0.0–0.2)

## 2022-11-20 LAB — ECHOCARDIOGRAM LIMITED
Height: 62.008 in
S' Lateral: 3.05 cm
Weight: 2321 oz

## 2022-11-20 SURGERY — LEFT HEART CATH AND CORONARY ANGIOGRAPHY
Anesthesia: LOCAL

## 2022-11-20 MED ORDER — FENTANYL CITRATE (PF) 100 MCG/2ML IJ SOLN
INTRAMUSCULAR | Status: AC
  Filled 2022-11-20: qty 2

## 2022-11-20 MED ORDER — SODIUM CHLORIDE 0.9% FLUSH: 3.0000 mL | INTRAVENOUS | Status: DC | PRN

## 2022-11-20 MED ORDER — SODIUM CHLORIDE 0.9 % IV SOLN: 250.0000 mL | INTRAVENOUS | Status: DC | PRN

## 2022-11-20 MED ORDER — NITROGLYCERIN 0.4 MG SL SUBL
0.4000 mg | SUBLINGUAL_TABLET | SUBLINGUAL | Status: DC | PRN
  Administered 2022-11-20: 0.4 mg via SUBLINGUAL

## 2022-11-20 MED ORDER — MIDAZOLAM HCL 2 MG/2ML IJ SOLN
INTRAMUSCULAR | Status: DC | PRN
  Administered 2022-11-20: 1 mg via INTRAVENOUS

## 2022-11-20 MED ORDER — SODIUM CHLORIDE 0.9 % WEIGHT BASED INFUSION: 1.0000 mL/kg/h | INTRAVENOUS | Status: DC

## 2022-11-20 MED ORDER — SODIUM CHLORIDE 0.9 % WEIGHT BASED INFUSION: 3.0000 mL/kg/h | INTRAVENOUS | Status: DC

## 2022-11-20 MED ORDER — ASPIRIN 81 MG PO CHEW: 81.0000 mg | CHEWABLE_TABLET | ORAL | Status: DC

## 2022-11-20 MED ORDER — HYDRALAZINE HCL 20 MG/ML IJ SOLN: 10.0000 mg | INTRAMUSCULAR | Status: AC | PRN

## 2022-11-20 MED ORDER — VERAPAMIL HCL 2.5 MG/ML IV SOLN
INTRAVENOUS | Status: AC
  Filled 2022-11-20: qty 2

## 2022-11-20 MED ORDER — LIDOCAINE HCL (PF) 1 % IJ SOLN
INTRAMUSCULAR | Status: DC | PRN
  Administered 2022-11-20: 5 mL

## 2022-11-20 MED ORDER — SODIUM CHLORIDE 0.9 % IV SOLN
250.0000 mL | INTRAVENOUS | Status: DC | PRN
  Administered 2022-11-20: 250 mL via INTRAVENOUS

## 2022-11-20 MED ORDER — FENTANYL CITRATE (PF) 100 MCG/2ML IJ SOLN
INTRAMUSCULAR | Status: DC | PRN
  Administered 2022-11-20: 25 ug via INTRAVENOUS

## 2022-11-20 MED ORDER — ONDANSETRON HCL 4 MG/2ML IJ SOLN
4.0000 mg | Freq: Three times a day (TID) | INTRAMUSCULAR | Status: DC | PRN
  Administered 2022-11-20: 4 mg via INTRAVENOUS
  Filled 2022-11-20: qty 2

## 2022-11-20 MED ORDER — VERAPAMIL HCL 2.5 MG/ML IV SOLN
INTRAVENOUS | Status: DC | PRN
  Administered 2022-11-20: 10 mL via INTRA_ARTERIAL

## 2022-11-20 MED ORDER — HEPARIN SODIUM (PORCINE) 1000 UNIT/ML IJ SOLN
INTRAMUSCULAR | Status: DC | PRN
  Administered 2022-11-20: 2500 [IU] via INTRA_ARTERIAL

## 2022-11-20 MED ORDER — MIDAZOLAM HCL 2 MG/2ML IJ SOLN
INTRAMUSCULAR | Status: AC
  Filled 2022-11-20: qty 2

## 2022-11-20 MED ORDER — SODIUM CHLORIDE 0.9% FLUSH
3.0000 mL | Freq: Two times a day (BID) | INTRAVENOUS | Status: DC
  Administered 2022-11-20 – 2022-11-25 (×10): 3 mL via INTRAVENOUS

## 2022-11-20 MED ORDER — IOHEXOL 350 MG/ML SOLN
INTRAVENOUS | Status: DC | PRN
  Administered 2022-11-20: 85 mL via INTRA_ARTERIAL

## 2022-11-20 MED ORDER — LABETALOL HCL 5 MG/ML IV SOLN: 10.0000 mg | INTRAVENOUS | Status: AC | PRN

## 2022-11-20 MED ORDER — HYDROMORPHONE HCL 1 MG/ML IJ SOLN
0.5000 mg | INTRAMUSCULAR | Status: DC | PRN
  Administered 2022-11-20: 0.5 mg via INTRAVENOUS
  Filled 2022-11-20: qty 0.5

## 2022-11-20 MED ORDER — SODIUM CHLORIDE 0.9 % IV SOLN: INTRAVENOUS | Status: AC

## 2022-11-20 MED ORDER — HEPARIN (PORCINE) IN NACL 1000-0.9 UT/500ML-% IV SOLN
INTRAVENOUS | Status: DC | PRN
  Administered 2022-11-20 (×2): 500 mL via INTRA_ARTERIAL

## 2022-11-20 MED ORDER — NITROGLYCERIN 0.4 MG SL SUBL
SUBLINGUAL_TABLET | SUBLINGUAL | Status: AC
  Filled 2022-11-20: qty 1

## 2022-11-20 MED ORDER — LIDOCAINE HCL (PF) 1 % IJ SOLN
INTRAMUSCULAR | Status: AC
  Filled 2022-11-20: qty 30

## 2022-11-20 MED ORDER — ONDANSETRON HCL 4 MG/2ML IJ SOLN: 4.0000 mg | Freq: Four times a day (QID) | INTRAMUSCULAR | Status: DC | PRN

## 2022-11-20 MED ORDER — SODIUM CHLORIDE 0.9% FLUSH: 3.0000 mL | Freq: Two times a day (BID) | INTRAVENOUS | Status: DC

## 2022-11-20 MED ORDER — ONDANSETRON 4 MG PO TBDP: 4.0000 mg | ORAL_TABLET | Freq: Three times a day (TID) | ORAL | Status: DC | PRN

## 2022-11-20 MED ORDER — HEPARIN SODIUM (PORCINE) 1000 UNIT/ML IJ SOLN
INTRAMUSCULAR | Status: AC
  Filled 2022-11-20: qty 10

## 2022-11-20 SURGICAL SUPPLY — 11 items
CATH DIAG 6FR PIGTAIL ANGLED (CATHETERS) IMPLANT
CATH LAUNCHER 6FR EBU3.5 (CATHETERS) IMPLANT
CATH OPTITORQUE TIG 4.0 6F (CATHETERS) IMPLANT
DEVICE RAD TR BAND REGULAR (VASCULAR PRODUCTS) IMPLANT
GLIDESHEATH SLEND SS 6F .021 (SHEATH) IMPLANT
KIT HEART LEFT (KITS) ×1 IMPLANT
PACK CARDIAC CATHETERIZATION (CUSTOM PROCEDURE TRAY) ×1 IMPLANT
SYR MEDRAD MARK 7 150ML (SYRINGE) ×1 IMPLANT
TRANSDUCER W/STOPCOCK (MISCELLANEOUS) ×1 IMPLANT
TUBING CIL FLEX 10 FLL-RA (TUBING) ×1 IMPLANT
WIRE EMERALD 3MM-J .035X260CM (WIRE) IMPLANT

## 2022-11-20 NOTE — Interval H&P Note (Signed)
History and Physical Interval Note:  11/20/2022 9:13 AM  Mercedes Barr  has presented today for surgery, with the diagnosis of STEMI.  The various methods of treatment have been discussed with the patient and family. After consideration of risks, benefits and other options for treatment, the patient has consented to  Procedure(s): Coronary/Graft Acute MI Revascularization (N/A) LEFT HEART CATH AND CORONARY ANGIOGRAPHY (N/A) as a surgical intervention.  The patient's history has been reviewed, patient examined, no change in status, stable for surgery.  I have reviewed the patient's chart and labs.  Questions were answered to the patient's satisfaction.     Orbie Pyo

## 2022-11-20 NOTE — Discharge Instructions (Addendum)
LAPAROSCOPIC SURGERY: POST OP INSTRUCTIONS Always review your discharge instruction sheet given to you by the facility where your surgery was performed. IF YOU HAVE DISABILITY OR FAMILY LEAVE FORMS, YOU MUST BRING THEM TO THE OFFICE FOR PROCESSING.   DO NOT GIVE THEM TO YOUR DOCTOR.  PAIN CONTROL  First take acetaminophen (Tylenol) AND/or ibuprofen (Advil) to control your pain after surgery.  Follow directions on package.  Taking acetaminophen (Tylenol) and/or ibuprofen (Advil) regularly after surgery will help to control your pain and lower the amount of prescription pain medication you may need.  You should not take more than 3,000 mg (3 grams) of acetaminophen (Tylenol) in 24 hours.  You should not take ibuprofen (Advil), aleve, motrin, naprosyn or other NSAIDS if you have a history of stomach ulcers or chronic kidney disease.  A prescription for pain medication may be given to you upon discharge.  Take your pain medication as prescribed, if you still have uncontrolled pain after taking acetaminophen (Tylenol) or ibuprofen (Advil). Use ice packs to help control pain. If you need a refill on your pain medication, please contact your pharmacy.  They will contact our office to request authorization. Prescriptions will not be filled after 5pm or on week-ends.  HOME MEDICATIONS Take your usually prescribed medications unless otherwise directed.  DIET You should follow a light diet the first few days after arrival home.  Be sure to include lots of fluids daily. Avoid fatty, fried foods.   CONSTIPATION It is common to experience some constipation after surgery and if you are taking pain medication.  Increasing fluid intake and taking a stool softener (such as Colace) will usually help or prevent this problem from occurring.  A mild laxative (Milk of Magnesia or Miralax) should be taken according to package instructions if there are no bowel movements after 48 hours.  WOUND/INCISION CARE Most  patients will experience some swelling and bruising in the area of the incisions.  Ice packs will help.  Swelling and bruising can take several days to resolve.  Unless discharge instructions indicate otherwise, follow guidelines below  STERI-STRIPS - you may remove your outer bandages 48 hours after surgery, and you may shower at that time.  You have steri-strips (small skin tapes) in place directly over the incision.  These strips should be left on the skin for 7-10 days.   DERMABOND/SKIN GLUE - you may shower in 24 hours.  The glue will flake off over the next 2-3 weeks. Any sutures or staples will be removed at the office during your follow-up visit.  ACTIVITIES You may resume regular (light) daily activities beginning the next day--such as daily self-care, walking, climbing stairs--gradually increasing activities as tolerated.  You may have sexual intercourse when it is comfortable.  Refrain from any heavy lifting or straining until approved by your doctor. You may drive when you are no longer taking prescription pain medication, you can comfortably wear a seatbelt, and you can safely maneuver your car and apply brakes.  FOLLOW-UP You should see your doctor in the office for a follow-up appointment approximately 2-3 weeks after your surgery.  You should have been given your post-op/follow-up appointment when your surgery was scheduled.  If you did not receive a post-op/follow-up appointment, make sure that you call for this appointment within a day or two after you arrive home to insure a convenient appointment time.  OTHER INSTRUCTIONS   WHEN TO CALL YOUR DOCTOR: Fever over 101.0 Inability to urinate Continued bleeding from incision. Increased pain,   redness, or drainage from the incision. Increasing abdominal pain  The clinic staff is available to answer your questions during regular business hours.  Please don't hesitate to call and ask to speak to one of the nurses for clinical  concerns.  If you have a medical emergency, go to the nearest emergency room or call 911.  A surgeon from [facility] is always on call at the hospital. [address], [place], [place]  [address] ? [address] [address], [place], [place]   [address] [phone] ? [phone] ? FAX [phone] Web site: [email or web address]     _______________________________________________________________________________________ Dear Drema Balzarine,   Thank you so much for allowing [place] to be part of your care!  You were admitted to [facility] for infection in your gallbladder. 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 You were seen by cardiology and your medications were changed please follow the new list of medications.    POST-HOSPITAL & CARE INSTRUCTIONS Please take your medications as prescribed and go to your appointment with cardiology and family medicine.  Please let PCP/Specialists know of any changes that were made.  Please see medications section of this packet for any medication changes.   DOCTOR'S APPOINTMENT & FOLLOW UP CARE INSTRUCTIONS  Future Appointments  Date Time Provider Department Center  11/28/2022  3:10 PM Fayette Pho, MD [facility]  12/05/2022  4:20 PM Croitoru, Rachelle Hora, MD [facility] None  01/18/2023  9:15 AM Helane Gunther, DPM [facility]  02/15/2023  7:15 AM [facility] DEVICE REMOTES [facility]  10/23/2023 11:00 AM [facility] ANNUAL WELLNESS VISIT [facility]    RETURN PRECAUTIONS: If you have chest pain or feel light headed.   Take care and be well!  Family Medicine Teaching Service  [place]  [facility]  [address] [place], [place] [address] [phone]

## 2022-11-20 NOTE — Assessment & Plan Note (Addendum)
LHC from 4/14 with evidence of 80% stenosis in 1st diagonal. 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  - Cardiology following, started GDMT   - Entresto   - crestor   - carvedilol  - Heart failure following  - Crestor 20mg  daily - Continue telemetry

## 2022-11-20 NOTE — Progress Notes (Signed)
   11/20/22 0856  Spiritual Encounters  Type of Visit Initial  Care provided to: Pt and family  Conversation partners present during encounter Nurse  Referral source Code page  Reason for visit Code  OnCall Visit Yes   Chaplain responded to Code STEMI page.  PT initially unavailable as medical team provided care. Mirna Mires spoke with family outside the room and then entered the room as medical staff cleared out.  PT to be taken to Cath lab. Chaplain spoke brief words of encouragement.

## 2022-11-20 NOTE — Progress Notes (Signed)
Rounding Note    Patient Name: Mercedes Barr Date of Encounter: 11/20/2022  [facility] Cardiologist: Thurmon Fair, MD   Subjective   Called urgently to bedside for worsening chest pain and ECG changes this AM. ECG personally reviewed and shows deep TWI in the precordial leads new from prior (different than prior paced rhythms as well). Patient with atypical chest pain that is worse when laying flat, deep breaths, palpitation and movement. States it is different from that on admission. Bedside TTE with mid-to-apical septal and mid-to-apical anterior WMA which is new from prior. May be stress CM but will take to cath lab to ensure no obstructive CAD.  Discussed extensively with patient, daughter and Dr. Lynnette Caffey.  Inpatient Medications    Scheduled Meds:  acetaminophen  650 mg Oral Q6H   [START ON 11/21/2022] aspirin  81 mg Oral Pre-Cath   citalopram  20 mg Oral q morning   fluticasone furoate-vilanterol  1 puff Inhalation Daily   insulin aspart  0-5 Units Subcutaneous QHS   insulin aspart  0-9 Units Subcutaneous TID WC   latanoprost  1 drop Both Eyes QHS   levothyroxine  112 mcg Oral Q0600   nitroGLYCERIN       propranolol  40 mg Oral Daily   rosuvastatin  20 mg Oral Daily   sodium chloride flush  3 mL Intravenous Q12H   Continuous Infusions:  sodium chloride     [START ON 11/21/2022] sodium chloride     Followed by   Melene Muller ON 11/21/2022] sodium chloride     cefTRIAXone (ROCEPHIN)  IV 0 g (11/18/22 1112)   PRN Meds: sodium chloride, HYDROmorphone (DILAUDID) injection, nitroGLYCERIN, nitroGLYCERIN, ondansetron (ZOFRAN) IV **OR** ondansetron, povidone-iodine, sodium chloride flush   Vital Signs    Vitals:   11/20/22 0036 11/20/22 0356 11/20/22 0538 11/20/22 0737  BP: (!) 146/81 (!) 151/72  (!) 152/83  Pulse: 68 (!) 59  61  Resp: Temp: 98.6 F (37 C) 98.5 F (36.9 C)  98.2 F (36.8 C)  TempSrc: Oral Oral  Oral  SpO2: 96% 96%  96%  Weight:    65.8 kg   Height:        Intake/Output Summary (Last 24 hours) at 11/20/2022 0846 Last data filed at 11/20/2022 0600 Gross per 24 hour  Intake 1177 ml  Output 715 ml  Net 462 ml      11/20/2022    5:38 AM 11/19/2022    7:58 AM 11/19/2022    3:40 AM  Last 3 Weights  Weight (lbs) 145 lb 1 oz 132 lb 4.4 oz 132 lb 4.4 oz  Weight (kg) 65.8 kg 60 kg 60 kg      Telemetry    V-paced - Personally Reviewed  ECG    V-paced with deep TWI in the precordial leads (new) - Personally Reviewed  Physical Exam   GEN: Uncomfortable Neck: No JVD Cardiac: RRR, no murmurs, rubs, or gallops. Chest tender to palpation Respiratory: Crackles at bases GI: Soft, nontender, non-distended  MS: Soft, nondistended Neuro:  Nonfocal  Psych: Normal affect   Labs    High Sensitivity Troponin:   Recent Labs  Lab 11/18/22 1017 11/19/22 1626 11/19/22 1755 11/19/22 2008 11/19/22 2138  TROPONINIHS 93* 47* 68* 58* 56*     Chemistry Recent Labs  Lab 11/18/22 0245 11/18/22 0355 11/19/22 0035 11/20/22 0112  NA 137  --  137 135  K 4.6  --  3.4* 4.2  CL  102  --  100 101  CO2 23  --  25 25  GLUCOSE 182*  --  222* 210*  BUN 12  --  14 18  CREATININE 0.82  --  0.94 1.02*  CALCIUM 8.6*  --  8.4* 8.1*  PROT  --  6.8 6.5 6.6  ALBUMIN  --  3.4* 3.2* 3.0*  AST  --  44* 21 43*  ALT  --  20 26 33  ALKPHOS  --  46 46 47  BILITOT  --  0.9 0.8 0.6  GFRNONAA >60  --  >60 57*  ANIONGAP 12  --  12 9    Lipids No results for input(s): "CHOL", "TRIG", "HDL", "LABVLDL", "LDLCALC", "CHOLHDL" in the last 168 hours.  Hematology Recent Labs  Lab 11/18/22 0245 11/19/22 0035 11/20/22 0112  WBC 8.9 11.3* 9.8  RBC 4.91 4.73 4.84  HGB 13.7 12.9 13.0  HCT 41.6 41.4 42.5  MCV 84.7 87.5 87.8  MCH 27.9 27.3 26.9  MCHC 32.9 31.2 30.6  RDW 13.9 13.8 13.7  PLT 295 201 239   Thyroid No results for input(s): "TSH", "FREET4" in the last 168 hours.  BNPNo results for input(s): "BNP", "PROBNP" in the last 168  hours.  DDimer No results for input(s): "DDIMER" in the last 168 hours.   Radiology    No results found.  Cardiac Studies    TTE 03/17/23: IMPRESSIONS     1. Left ventricular ejection fraction, by estimation, is 55 to 60%. The  left ventricle has normal function. The left ventricle has no regional  wall motion abnormalities. Left ventricular diastolic parameters are  indeterminate. Elevated left ventricular  end-diastolic pressure. The E/e' is 20.   2. Right ventricular systolic function is normal. The right ventricular  size is normal. There is normal pulmonary artery systolic pressure. The  estimated right ventricular systolic pressure is 29.4 mmHg.   3. Left atrial size was moderately dilated.   4. The mitral valve is grossly normal. Trivial mitral valve  regurgitation.   5. The aortic valve is tricuspid. Aortic valve regurgitation is not  visualized.   6. The inferior vena cava is normal in size with greater than 50%  respiratory variability, suggesting right atrial pressure of 3 mmHg.   Comparison(s): 10/24/12 EF 55-60%.   Patient Profile     77 y.o. 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 52 complicated by CHB s/p pacemaker with revision of system in 1999 with last gen change 2018 ([employer]), longstanding persistent afib/flutter on Xarelto, arthritis, asthma, COPD, chronic HFpEF, DM, HTN, hypothyroidism, GERD   Assessment & Plan    #Chest Pain: #Dynamic ECG Changes: #WMA on TTE: -Called emergently to bedside for chest pain and new, deep TWI in precordial leads -Patient currently complaining of severe chest pain that is worse with palpation, deep inspiration and laying flat; notably had chest wall tenderness on presentation but states the pain she is having currently is different than that pain even though that pain still persists -Bedside TTE limited but shows mid-to-apical septal and anterior WMA which may represent a stress CM but need to rule  out obstructive LAD disease -Plan discussed with surgery team as well given recent cholecystectomy and patient okay for heparin and DAPT if necessary with goal to avoid supratherapeutic levels of heparin if able -Discussed with Dr. Lynnette Caffey and family at bedside and will plan for cath today to evaluate further -Plan for formal TTE post-cath -Continue ASA  daily, crestor   daily  #Cholecystitis s/p lap cholecystectomy: -Underwent cholecystectomy on 4/13 -Per general surgery, okay to use heparin/DAPT if needed for cath if deemed emergent -Post-op care per surgery  #Longstanding persistent Afib/flutter: -V-paced on the monitor -Xarelto held for surgery -Resume once cleared for surgery  #History of CHB s/p PPM: -V-paced on tele currently  #History of atrioventricular cushion defect repair age 38: -Follow-up TTE as above  INFORMED CONSENT: I have reviewed the risks, indications, and alternatives to cardiac catheterization, possible angioplasty, and stenting with the patient. Risks include but are not limited to bleeding, infection, vascular injury, stroke, myocardial infection, arrhythmia, kidney injury, radiation-related injury in the case of prolonged fluoroscopy use, emergency cardiac surgery, and death. The patient understands the risks of serious complication is 1-2 in 1000 with diagnostic cardiac cath and 1-2% or less with angioplasty/stenting.    For questions or updates, please contact [facility] Please consult [email or web address] for contact info under        Signed, Meriam Sprague, MD  11/20/2022, 8:46 AM

## 2022-11-20 NOTE — Progress Notes (Signed)
Daily Progress Note Intern Pager: [phone]  Patient name: FRANCENE PURDUM Medical record number: [id number] Date of birth: 18-Oct-1945 Age: 77 y.o. Gender: female  Primary Care Provider: Evelena Leyden, DO Consultants: General Surgery, Cardiology Code Status: DNR  Pt Overview and Major Events to Date:  4/12- admitted 4/13- Lap chole with GS 4/14- EKG Changes and chest pain, plan for cath lab  Assessment and Plan: ROSAELENA CROOKER is a 77 yo female p/w epigastric and RUQ pain found to have acute cholecystitis, now s/p lap chole (4/13). Cardiology has been involved in her care from the beginning due to complex cardiac hx including complete heart block s/p ICD placement, Afib on Xarelto, and hx of AV Cushion repair in 1988, Unfortunately now has chest pain with EKG changes.   ECG abnormality ECG obtained in the setting of atypical chest pain (worse with deep inspiration and lying flat) shows deep TWI across the precordial leads, most prominent in V2 and V3. Got ASA last night for the same. ECG pattern c/f Type B Wellen's.  - Already received ASA 325mg  - Repeat EKG - Cards called to bedside urgently, have obtained echo with new wall motion abnormalities, being taken to cath lab urgently - Crestor 20mg  daily  Abdominal pain Tolerated surgery well yesterday. Minimal abd pain and has been able to eat/drink. Post-op course now complicated by #1.  -Had been holding Xarelto for surgery, will restart as able pending cath/cardiac workup - Tylenol 650mg  q 6h - d/c ceftriaxone - Will have PT/OT See post-operatively    Chronic Stable Conditions Essential tremor- continue propranolol 40mg  daily Depression- continue citalopram 20mg  daily Hypothyroidism- Synthroid daily HLD- Crestor 20mg  daily   FEN/GI: NPO for cath PPx: SCDs for now, restart Xarelto as able  Dispo:Pending PT recommendations  in 2-3 days. Barriers include urgent cath today, post-op PT/OT evaluation.   Subjective:   She tells me she is having CP this morning, described as worse with a deep inspiration and worse when lying flat. Accompanied by some shortness of breath and nausea. Also with chest wall pain that is chronic and present since admission, is reproducible on palpation.   Objective: Temp:  [97.7 F (36.5 C)-98.8 F (37.1 C)] 98.2 F (36.8 C) (04/14 0737) Pulse Rate:  [59-79] 61 (04/14 0737) Resp:  [17-22] 17 (04/14 0737) BP: (121-152)/(58-83) 152/83 (04/14 0737) SpO2:  [90 %-97 %] 90 % (04/14 0926) Weight:  [65.8 kg] 65.8 kg (04/14 0538) Physical Exam: General: Uncomfortable, alert Cardiovascular: RRR, no murmur; +TTP over chest wall Respiratory: Bibasilar crackles Abdomen: Non-tender, non-distended, surgical sites unremarkable Extremities: Without edema or deformity   Laboratory: Most recent CBC Lab Results  Component Value Date   WBC 9.8 11/20/2022   HGB 13.0 11/20/2022   HCT 42.5 11/20/2022   MCV 87.8 11/20/2022   PLT 239 11/20/2022   Most recent BMP    Latest Ref Rng & Units 11/20/2022    1:12 AM  BMP  Glucose 70 - 99 mg/dL 333   BUN 8 - 23 mg/dL 18   Creatinine 5.45 - 1.00 mg/dL 6.25   Sodium 638 - 937 mmol/L 135   Potassium 3.5 - 5.1 mmol/L 4.2   Chloride 98 - 111 mmol/L 101   CO2 22 - 32 mmol/L 25   Calcium 8.9 - 10.3 mg/dL 8.1      Imaging/Diagnostic Tests: EKG: Deep TWI in precordial leads, most prominent in V2-V3, c/f Type B Wellens morphology   Alicia Amel, MD 11/20/2022,  9:29 AM  PGY-2, [facility] Family Medicine FPTS Intern pager: [phone], text pages welcome Secure chat group [facility] Teaching Service

## 2022-11-20 NOTE — Progress Notes (Signed)
Day of Surgery   Subjective/Chief Complaint: Patient had chest pain and EKG changes.  Cath lab today per cardiology.  No acute interventions.  Still having some nausea and abdominal pain - hard to determine if improved from pre-op.   Objective: Vital signs in last 24 hours: Temp:  [98.1 F (36.7 C)-98.6 F (37 C)] 98.1 F (36.7 C) (04/14 1025) Pulse Rate:  [59-90] 60 (04/14 1025) Resp:  [17-30] 19 (04/14 1025) BP: (124-166)/(60-83) 127/81 (04/14 1025) SpO2:  [90 %-97 %] 94 % (04/14 1025) Weight:  [65.8 kg] 65.8 kg (04/14 0538) Last BM Date : 11/17/22  Intake/Output from previous day: 04/13 0701 - 04/14 0700 In: 1177 [P.O.:477; I.V.:600; IV Piggyback:100] Out: 715 [Urine:700; Blood:15] Intake/Output this shift: No intake/output data recorded.  Elderly female - NAD Abd - soft, tender in RUQ and around umbilicus.  Incisions c/d/I   Lab Results:  Recent Labs    11/19/22 0035 11/20/22 0112  WBC 11.3* 9.8  HGB 12.9 13.0  HCT 41.4 42.5  PLT 201 239   BMET Recent Labs    11/19/22 0035 11/20/22 0112  NA 137 135  K 3.4* 4.2  CL 100 101  CO2 25 25  GLUCOSE 222* 210*  BUN 14 18  CREATININE 0.94 1.02*  CALCIUM 8.4* 8.1*      Latest Ref Rng & Units 11/20/2022    1:12 AM 11/19/2022   12:35 AM 11/18/2022    3:55 AM  Hepatic Function  Total Protein 6.5 - 8.1 g/dL 6.6  6.5  6.8   Albumin 3.5 - 5.0 g/dL 3.0  3.2  3.4   AST 15 - 41 U/L 43  21  44   ALT 0 - 44 U/L 33  26  20   Alk Phosphatase 38 - 126 U/L 47  46  46   Total Bilirubin 0.3 - 1.2 mg/dL 0.6  0.8  0.9   Bilirubin, Direct 0.0 - 0.2 mg/dL   0.5      Studies/Results: CARDIAC CATHETERIZATION  Result Date: 11/20/2022   Prox RCA lesion is 30% stenosed.   Prox LAD lesion is 40% stenosed.   1st Diag lesion is 80% stenosed. 1.  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 2.  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 Recommendations: The results were reviewed with Dr. Shari Prows and medical management will be pursued.    Anti-infectives: Anti-infectives (From admission, onward)    Start     Dose/Rate Route Frequency Ordered Stop   11/18/22 1000  cefTRIAXone (ROCEPHIN) 2 g in sodium chloride 0.9 % 100 mL IVPB  Status:  Discontinued        2 g 200 mL/hr over 30 Minutes Intravenous Every 24 hours 11/18/22 0834 11/20/22 6734       Assessment/Plan: Acute cholecystitis S/p laparoscopic lysis of adhesions/ lap chole 11/19/22 - Dr. Cliffton Asters   Post-op nausea and abdominal pain Consider decreasing diet to clears until her nausea has resolved. WBC normal/ LFT's WNL except mild AST elevation.  Anticoagulation and mobilization per primary team and cardiology  LOS: 0 days    Mercedes Barr 11/20/2022

## 2022-11-20 NOTE — Plan of Care (Signed)
Problem: Education: Goal: Ability to describe self-care measures that may prevent or decrease complications (Diabetes Survival Skills Education) will improve Outcome: Progressing Goal: Individualized Educational Video(s) Outcome: Progressing   Problem: Coping: Goal: Ability to adjust to condition or change in health will improve Outcome: Progressing   Problem: Fluid Volume: Goal: Ability to maintain a balanced intake and output will improve Outcome: Progressing   Problem: Health Behavior/Discharge Planning: Goal: Ability to identify and utilize available resources and services will improve Outcome: Progressing Goal: Ability to manage health-related needs will improve Outcome: Progressing   Problem: Metabolic: Goal: Ability to maintain appropriate glucose levels will improve Outcome: Progressing   Problem: Nutritional: Goal: Maintenance of adequate nutrition will improve Outcome: Progressing Goal: Progress toward achieving an optimal weight will improve Outcome: Progressing   Problem: Skin Integrity: Goal: Risk for impaired skin integrity will decrease Outcome: Progressing   Problem: Tissue Perfusion: Goal: Adequacy of tissue perfusion will improve Outcome: Progressing   Problem: Education: Goal: Knowledge of General Education information will improve Description: Including pain rating scale, medication(s)/side effects and non-pharmacologic comfort measures Outcome: Progressing   Problem: Health Behavior/Discharge Planning: Goal: Ability to manage health-related needs will improve Outcome: Progressing   Problem: Clinical Measurements: Goal: Ability to maintain clinical measurements within normal limits will improve Outcome: Progressing Goal: Will remain free from infection Outcome: Progressing Goal: Diagnostic test results will improve Outcome: Progressing Goal: Respiratory complications will improve Outcome: Progressing Goal: Cardiovascular complication will  be avoided Outcome: Progressing   Problem: Activity: Goal: Risk for activity intolerance will decrease Outcome: Progressing   Problem: Nutrition: Goal: Adequate nutrition will be maintained Outcome: Progressing   Problem: Coping: Goal: Level of anxiety will decrease Outcome: Progressing   Problem: Elimination: Goal: Will not experience complications related to bowel motility Outcome: Progressing Goal: Will not experience complications related to urinary retention Outcome: Progressing   Problem: Pain Managment: Goal: General experience of comfort will improve Outcome: Progressing   Problem: Safety: Goal: Ability to remain free from injury will improve Outcome: Progressing   Problem: Skin Integrity: Goal: Risk for impaired skin integrity will decrease Outcome: Progressing   Problem: Education: Goal: Knowledge of General Education information will improve Description: Including pain rating scale, medication(s)/side effects and non-pharmacologic comfort measures Outcome: Progressing   Problem: Health Behavior/Discharge Planning: Goal: Ability to manage health-related needs will improve Outcome: Progressing   Problem: Clinical Measurements: Goal: Ability to maintain clinical measurements within normal limits will improve Outcome: Progressing Goal: Will remain free from infection Outcome: Progressing Goal: Diagnostic test results will improve Outcome: Progressing Goal: Respiratory complications will improve Outcome: Progressing Goal: Cardiovascular complication will be avoided Outcome: Progressing   Problem: Activity: Goal: Risk for activity intolerance will decrease Outcome: Progressing   Problem: Nutrition: Goal: Adequate nutrition will be maintained Outcome: Progressing   Problem: Coping: Goal: Level of anxiety will decrease Outcome: Progressing   Problem: Elimination: Goal: Will not experience complications related to bowel motility Outcome:  Progressing Goal: Will not experience complications related to urinary retention Outcome: Progressing   Problem: Pain Managment: Goal: General experience of comfort will improve Outcome: Progressing   Problem: Safety: Goal: Ability to remain free from injury will improve Outcome: Progressing   Problem: Skin Integrity: Goal: Risk for impaired skin integrity will decrease Outcome: Progressing   Problem: Education: Goal: Understanding of CV disease, CV risk reduction, and recovery process will improve Outcome: Progressing Goal: Individualized Educational Video(s) Outcome: Progressing   Problem: Activity: Goal: Ability to return to baseline activity level will improve Outcome: Progressing  Problem: Cardiovascular: Goal: Ability to achieve and maintain adequate cardiovascular perfusion will improve Outcome: Progressing Goal: Vascular access site(s) Level 0-1 will be maintained Outcome: Progressing   

## 2022-11-20 NOTE — H&P (View-Only) (Signed)
Rounding Note    Patient Name: Mercedes Barr Date of Encounter: 11/20/2022  [facility] Cardiologist: Thurmon Fair, MD   Subjective   Called urgently to bedside for worsening chest pain and ECG changes this AM. ECG personally reviewed and shows deep TWI in the precordial leads new from prior (different than prior paced rhythms as well). Patient with atypical chest pain that is worse when laying flat, deep breaths, palpitation and movement. States it is different from that on admission. Bedside TTE with mid-to-apical septal and mid-to-apical anterior WMA which is new from prior. May be stress CM but will take to cath lab to ensure no obstructive CAD.  Discussed extensively with patient, daughter and Dr. Lynnette Caffey.  Inpatient Medications    Scheduled Meds:  acetaminophen  650 mg Oral Q6H   [START ON 11/21/2022] aspirin  81 mg Oral Pre-Cath   citalopram  20 mg Oral q morning   fluticasone furoate-vilanterol  1 puff Inhalation Daily   insulin aspart  0-5 Units Subcutaneous QHS   insulin aspart  0-9 Units Subcutaneous TID WC   latanoprost  1 drop Both Eyes QHS   levothyroxine  112 mcg Oral Q0600   nitroGLYCERIN       propranolol  40 mg Oral Daily   rosuvastatin  20 mg Oral Daily   sodium chloride flush  3 mL Intravenous Q12H   Continuous Infusions:  sodium chloride     [START ON 11/21/2022] sodium chloride     Followed by   Melene Muller ON 11/21/2022] sodium chloride     cefTRIAXone (ROCEPHIN)  IV 0 g (11/18/22 1112)   PRN Meds: sodium chloride, HYDROmorphone (DILAUDID) injection, nitroGLYCERIN, nitroGLYCERIN, ondansetron (ZOFRAN) IV **OR** ondansetron, povidone-iodine, sodium chloride flush   Vital Signs    Vitals:   11/20/22 0036 11/20/22 0356 11/20/22 0538 11/20/22 0737  BP: (!) 146/81 (!) 151/72  (!) 152/83  Pulse: 68 (!) 59  61  Resp: Temp: 98.6 F (37 C) 98.5 F (36.9 C)  98.2 F (36.8 C)  TempSrc: Oral Oral  Oral  SpO2: 96% 96%  96%  Weight:    65.8 kg   Height:        Intake/Output Summary (Last 24 hours) at 11/20/2022 0846 Last data filed at 11/20/2022 0600 Gross per 24 hour  Intake 1177 ml  Output 715 ml  Net 462 ml      11/20/2022    5:38 AM 11/19/2022    7:58 AM 11/19/2022    3:40 AM  Last 3 Weights  Weight (lbs) 145 lb 1 oz 132 lb 4.4 oz 132 lb 4.4 oz  Weight (kg) 65.8 kg 60 kg 60 kg      Telemetry    V-paced - Personally Reviewed  ECG    V-paced with deep TWI in the precordial leads (new) - Personally Reviewed  Physical Exam   GEN: Uncomfortable Neck: No JVD Cardiac: RRR, no murmurs, rubs, or gallops. Chest tender to palpation Respiratory: Crackles at bases GI: Soft, nontender, non-distended  MS: Soft, nondistended Neuro:  Nonfocal  Psych: Normal affect   Labs    High Sensitivity Troponin:   Recent Labs  Lab 11/18/22 1017 11/19/22 1626 11/19/22 1755 11/19/22 2008 11/19/22 2138  TROPONINIHS 93* 47* 68* 58* 56*     Chemistry Recent Labs  Lab 11/18/22 0245 11/18/22 0355 11/19/22 0035 11/20/22 0112  NA 137  --  137 135  K 4.6  --  3.4* 4.2  CL  102  --  100 101  CO2 23  --  25 25  GLUCOSE 182*  --  222* 210*  BUN 12  --  14 18  CREATININE 0.82  --  0.94 1.02*  CALCIUM 8.6*  --  8.4* 8.1*  PROT  --  6.8 6.5 6.6  ALBUMIN  --  3.4* 3.2* 3.0*  AST  --  44* 21 43*  ALT  --  20 26 33  ALKPHOS  --  46 46 47  BILITOT  --  0.9 0.8 0.6  GFRNONAA >60  --  >60 57*  ANIONGAP 12  --  12 9    Lipids No results for input(s): "CHOL", "TRIG", "HDL", "LABVLDL", "LDLCALC", "CHOLHDL" in the last 168 hours.  Hematology Recent Labs  Lab 11/18/22 0245 11/19/22 0035 11/20/22 0112  WBC 8.9 11.3* 9.8  RBC 4.91 4.73 4.84  HGB 13.7 12.9 13.0  HCT 41.6 41.4 42.5  MCV 84.7 87.5 87.8  MCH 27.9 27.3 26.9  MCHC 32.9 31.2 30.6  RDW 13.9 13.8 13.7  PLT 295 201 239   Thyroid No results for input(s): "TSH", "FREET4" in the last 168 hours.  BNPNo results for input(s): "BNP", "PROBNP" in the last 168  hours.  DDimer No results for input(s): "DDIMER" in the last 168 hours.   Radiology    No results found.  Cardiac Studies    TTE 03/17/23: IMPRESSIONS     1. Left ventricular ejection fraction, by estimation, is 55 to 60%. The  left ventricle has normal function. The left ventricle has no regional  wall motion abnormalities. Left ventricular diastolic parameters are  indeterminate. Elevated left ventricular  end-diastolic pressure. The E/e' is 20.   2. Right ventricular systolic function is normal. The right ventricular  size is normal. There is normal pulmonary artery systolic pressure. The  estimated right ventricular systolic pressure is 29.4 mmHg.   3. Left atrial size was moderately dilated.   4. The mitral valve is grossly normal. Trivial mitral valve  regurgitation.   5. The aortic valve is tricuspid. Aortic valve regurgitation is not  visualized.   6. The inferior vena cava is normal in size with greater than 50%  respiratory variability, suggesting right atrial pressure of 3 mmHg.   Comparison(s): 10/24/12 EF 55-60%.   Patient Profile     77 y.o. 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, arthritis, asthma, COPD, chronic HFpEF, DM, HTN, hypothyroidism, GERD   Assessment & Plan    #Chest Pain: #Dynamic ECG Changes: #WMA on TTE: -Called emergently to bedside for chest pain and new, deep TWI in precordial leads -Patient currently complaining of severe chest pain that is worse with palpation, deep inspiration and laying flat; notably had chest wall tenderness on presentation but states the pain she is having currently is different than that pain even though that pain still persists -Bedside TTE limited but shows mid-to-apical septal and anterior WMA which may represent a stress CM but need to rule  out obstructive LAD disease -Plan discussed with surgery team as well given recent cholecystectomy and patient okay for heparin and DAPT if necessary with goal to avoid supratherapeutic levels of heparin if able -Discussed with Dr. Thukkani and family at bedside and will plan for cath today to evaluate further -Plan for formal TTE post-cath -Continue ASA 81mg daily, crestor 20mg   daily  #Cholecystitis s/p lap cholecystectomy: -Underwent cholecystectomy on 4/13 -Per general surgery, okay to use heparin/DAPT if needed for cath if deemed emergent -Post-op care per surgery  #Longstanding persistent Afib/flutter: -V-paced on the monitor -Xarelto held for surgery -Resume once cleared for surgery  #History of CHB s/p PPM: -V-paced on tele currently  #History of atrioventricular cushion defect repair age 38: -Follow-up TTE as above  INFORMED CONSENT: I have reviewed the risks, indications, and alternatives to cardiac catheterization, possible angioplasty, and stenting with the patient. Risks include but are not limited to bleeding, infection, vascular injury, stroke, myocardial infection, arrhythmia, kidney injury, radiation-related injury in the case of prolonged fluoroscopy use, emergency cardiac surgery, and death. The patient understands the risks of serious complication is 1-2 in 1000 with diagnostic cardiac cath and 1-2% or less with angioplasty/stenting.    For questions or updates, please contact [facility] Please consult [email or web address] for contact info under        Signed, Meriam Sprague, MD  11/20/2022, 8:46 AM

## 2022-11-21 ENCOUNTER — Encounter (HOSPITAL_COMMUNITY): Payer: Self-pay | Admitting: Internal Medicine

## 2022-11-21 ENCOUNTER — Other Ambulatory Visit (HOSPITAL_COMMUNITY): Payer: Self-pay

## 2022-11-21 DIAGNOSIS — I5181 Takotsubo syndrome: Secondary | ICD-10-CM | POA: Diagnosis not present

## 2022-11-21 LAB — CBC
HCT: 41.5 % (ref 36.0–46.0)
Hemoglobin: 12.6 g/dL (ref 12.0–15.0)
MCH: 26.9 pg (ref 26.0–34.0)
MCHC: 30.4 g/dL (ref 30.0–36.0)
MCV: 88.5 fL (ref 80.0–100.0)
Platelets: 203 10*3/uL (ref 150–400)
RBC: 4.69 MIL/uL (ref 3.87–5.11)
RDW: 13.6 % (ref 11.5–15.5)
WBC: 7.5 10*3/uL (ref 4.0–10.5)
nRBC: 0 % (ref 0.0–0.2)

## 2022-11-21 LAB — GLUCOSE, CAPILLARY
Glucose-Capillary: 130 mg/dL — ABNORMAL HIGH (ref 70–99)
Glucose-Capillary: 141 mg/dL — ABNORMAL HIGH (ref 70–99)
Glucose-Capillary: 127 mg/dL — ABNORMAL HIGH (ref 70–99)
Glucose-Capillary: 186 mg/dL — ABNORMAL HIGH (ref 70–99)

## 2022-11-21 LAB — COMPREHENSIVE METABOLIC PANEL
ALT: 30 U/L (ref 0–44)
ALT: 25 U/L (ref 0–44)
AST: 28 U/L (ref 15–41)
AST: 24 U/L (ref 15–41)
Albumin: 2.8 g/dL — ABNORMAL LOW (ref 3.5–5.0)
Albumin: 2.9 g/dL — ABNORMAL LOW (ref 3.5–5.0)
Alkaline Phosphatase: 48 U/L (ref 38–126)
Alkaline Phosphatase: 52 U/L (ref 38–126)
Anion gap: 11 (ref 5–15)
Anion gap: 13 (ref 5–15)
BUN: 17 mg/dL (ref 8–23)
BUN: 16 mg/dL (ref 8–23)
CO2: 26 mmol/L (ref 22–32)
CO2: 22 mmol/L (ref 22–32)
Calcium: 8.5 mg/dL — ABNORMAL LOW (ref 8.9–10.3)
Calcium: 8.2 mg/dL — ABNORMAL LOW (ref 8.9–10.3)
Chloride: 102 mmol/L (ref 98–111)
Chloride: 101 mmol/L (ref 98–111)
Creatinine, Ser: 0.97 mg/dL (ref 0.44–1.00)
Creatinine, Ser: 0.82 mg/dL (ref 0.44–1.00)
GFR, Estimated: >60 mL/min (ref >60)
GFR, Estimated: >60 mL/min (ref >60)
Glucose, Bld: 148 mg/dL — ABNORMAL HIGH (ref 70–99)
Glucose, Bld: 127 mg/dL — ABNORMAL HIGH (ref 70–99)
Potassium: 4.1 mmol/L (ref 3.5–5.1)
Potassium: 3.9 mmol/L (ref 3.5–5.1)
Sodium: 139 mmol/L (ref 135–145)
Sodium: 136 mmol/L (ref 135–145)
Total Bilirubin: 0.7 mg/dL (ref 0.3–1.2)
Total Bilirubin: 1.1 mg/dL (ref 0.3–1.2)
Total Protein: 6.5 g/dL (ref 6.5–8.1)
Total Protein: 6.5 g/dL (ref 6.5–8.1)

## 2022-11-21 LAB — MAGNESIUM: Magnesium: 2.7 mg/dL — ABNORMAL HIGH (ref 1.7–2.4)

## 2022-11-21 MED ORDER — MAGNESIUM SULFATE 2 GM/50ML IV SOLN
2.0000 g | Freq: Once | INTRAVENOUS | Status: AC
  Administered 2022-11-21: 2 g via INTRAVENOUS
  Filled 2022-11-21: qty 50

## 2022-11-21 MED ORDER — METHOCARBAMOL 500 MG PO TABS
500.0000 mg | ORAL_TABLET | Freq: Three times a day (TID) | ORAL | Status: DC | PRN
  Administered 2022-11-22 – 2022-11-24 (×2): 500 mg via ORAL
  Filled 2022-11-21 (×2): qty 1

## 2022-11-21 MED ORDER — CARVEDILOL 6.25 MG PO TABS
6.2500 mg | ORAL_TABLET | Freq: Two times a day (BID) | ORAL | Status: DC
  Administered 2022-11-21 – 2022-11-25 (×9): 6.25 mg via ORAL
  Filled 2022-11-21 (×9): qty 1

## 2022-11-21 MED ORDER — RIVAROXABAN 20 MG PO TABS
20.0000 mg | ORAL_TABLET | Freq: Every day | ORAL | Status: DC
  Administered 2022-11-21 – 2022-11-25 (×5): 20 mg via ORAL
  Filled 2022-11-21 (×5): qty 1

## 2022-11-21 MED ORDER — METOPROLOL TARTRATE 25 MG PO TABS
25.0000 mg | ORAL_TABLET | ORAL | Status: AC
  Administered 2022-11-21: 25 mg via ORAL
  Filled 2022-11-21: qty 1

## 2022-11-21 MED ORDER — ONDANSETRON 4 MG PO TBDP
4.0000 mg | ORAL_TABLET | Freq: Four times a day (QID) | ORAL | Status: DC | PRN
  Administered 2022-11-21: 4 mg via ORAL
  Filled 2022-11-21: qty 1

## 2022-11-21 MED ORDER — AMIODARONE LOAD VIA INFUSION
150.0000 mg | Freq: Once | INTRAVENOUS | Status: AC
  Administered 2022-11-21: 150 mg via INTRAVENOUS
  Filled 2022-11-21: qty 83.34

## 2022-11-21 MED ORDER — AMIODARONE HCL IN DEXTROSE 360-4.14 MG/200ML-% IV SOLN
60.0000 mg/h | INTRAVENOUS | Status: DC
  Administered 2022-11-22 (×3): 60 mg/h via INTRAVENOUS
  Administered 2022-11-22: 30 mg/h via INTRAVENOUS
  Administered 2022-11-23: 60 mg/h via INTRAVENOUS
  Filled 2022-11-21 (×5): qty 200

## 2022-11-21 MED ORDER — AMIODARONE HCL IN DEXTROSE 360-4.14 MG/200ML-% IV SOLN
INTRAVENOUS | Status: AC
  Filled 2022-11-21: qty 200

## 2022-11-21 MED ORDER — ONDANSETRON HCL 4 MG/2ML IJ SOLN: 4.0000 mg | Freq: Four times a day (QID) | INTRAMUSCULAR | Status: DC | PRN

## 2022-11-21 MED ORDER — EMPAGLIFLOZIN 25 MG PO TABS
25.0000 mg | ORAL_TABLET | Freq: Every day | ORAL | Status: DC
  Administered 2022-11-21 – 2022-11-25 (×5): 25 mg via ORAL
  Filled 2022-11-21 (×6): qty 1

## 2022-11-21 MED ORDER — FLUOXETINE HCL 10 MG PO CAPS
10.0000 mg | ORAL_CAPSULE | Freq: Every day | ORAL | Status: DC
  Administered 2022-11-22 – 2022-11-25 (×4): 10 mg via ORAL
  Filled 2022-11-21 (×4): qty 1

## 2022-11-21 MED ORDER — AMIODARONE HCL IN DEXTROSE 360-4.14 MG/200ML-% IV SOLN
60.0000 mg/h | INTRAVENOUS | Status: DC
  Administered 2022-11-21 (×2): 60 mg/h via INTRAVENOUS
  Filled 2022-11-21: qty 200

## 2022-11-21 MED ORDER — PROCHLORPERAZINE EDISYLATE 10 MG/2ML IJ SOLN
10.0000 mg | Freq: Four times a day (QID) | INTRAMUSCULAR | Status: DC | PRN
  Administered 2022-11-21: 10 mg via INTRAVENOUS
  Filled 2022-11-21: qty 2

## 2022-11-21 MED ORDER — POLYETHYLENE GLYCOL 3350 17 G PO PACK: 17.0000 g | PACK | Freq: Every day | ORAL | Status: DC | PRN

## 2022-11-21 MED ORDER — SACUBITRIL-VALSARTAN 24-26 MG PO TABS
1.0000 | ORAL_TABLET | Freq: Two times a day (BID) | ORAL | Status: DC
  Administered 2022-11-21 – 2022-11-22 (×4): 1 via ORAL
  Filled 2022-11-21 (×4): qty 1

## 2022-11-21 NOTE — Progress Notes (Addendum)
FMTS Interim Progress Note  S: Messaged by nurse, Tinnie Gens, as patient had multiple runs of Vtach on the telemetry monitor. Visited patient at the bedside. Patient reports that she feels very nauseous, but otherwise denies chest pain, shortness of breath, or lightheadedness.   O: BP (!) 152/71   Pulse 60   Temp 97.8 F (36.6 C) (Oral)   Resp 18   Ht 5' 2.01" (1.575 m)   Wt 65 kg   SpO2 97%   BMI 26.20 kg/m   General: appears uncomfortable, but not in acute distress  CV: tachycardic, RRR, appropriately warm extremities  Resp: Normal work of breathing on room air  Abd: Soft, mildly diffusely tender, no distention   A/P: Arrhythmia I Takotsubo cardiomyopathy I H/o complete heart block with ICD  Vtach could be due to stress cardiomyopathy. EKG did appear worsened with QT prolongation and worsening peaked t waves. Cardiology also visited patient and has relayed that patient has mixed picture due to stress cardiomyopathy, recent surgery, and cardiac history.  - Mag sulfate 2 g now  - metoprolol 25 mg now  - F/u mag level  - Discontinued celexa  - Switched antiemetics to compazine from zofran  - F/u interrogation of ICD  - F/u CBC, CMP  - Monitor closely   [name], MD 11/21/2022, 3:23 PM PGY-1, [facility] Service pager [phone]

## 2022-11-21 NOTE — Progress Notes (Signed)
Heart Failure Navigator Progress Note  Assessed for Heart & Vascular TOC clinic readiness.  Patient EF 30-35% Admitted with Acute Cholecystitis, has a scheduled appointment with Dr. Royann Shivers on 12/05/2022. .   Navigator available for reassessment of patient.   Rhae Hammock, BSN, Scientist, clinical (histocompatibility and immunogenetics) Only

## 2022-11-21 NOTE — Plan of Care (Signed)
  Problem: Coping: Goal: Ability to adjust to condition or change in health will improve Outcome: Progressing   Problem: Education: Goal: Knowledge of General Education information will improve Description: Including pain rating scale, medication(s)/side effects and non-pharmacologic comfort measures Outcome: Progressing   Problem: Clinical Measurements: Goal: Cardiovascular complication will be avoided Outcome: Progressing

## 2022-11-21 NOTE — Evaluation (Signed)
Physical Therapy Evaluation Patient Details Name: Mercedes Barr MRN: [id number] DOB: 02-25-1946 Today's Date: 11/21/2022  History of Present Illness  77 y.o. female who presented on 4/12 with abdominal pain, s/p lap chole on 4/13, s/p L Cardiac Cath 11/20/22. 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 Pt to benefit from acute PT to address deficits. Pt ambulated hallway distance with standing rest break and use of RW, pt reporting musculoskeletal-type chest pain (TTP, sore when moving), but no chest tightness or radiating chest pain during session. VSS on RA. Pt reports progressive weakness, would like to improve activity tolerance and strength post-acutely, recommendations reflect this. PT to progress mobility as tolerated, and will continue to follow acutely.         Recommendations for follow up therapy are one component of a multi-disciplinary discharge planning process, led by the attending physician.  Recommendations may be updated based on patient status, additional functional criteria and insurance authorization.  Follow Up Recommendations       Assistance Recommended at Discharge Intermittent Supervision/Assistance  Patient can return home with the following  A little help with walking and/or transfers;A little help with bathing/dressing/bathroom    Equipment Recommendations None recommended by PT  Recommendations for Other Services       Functional Status Assessment Patient has had a recent decline in their functional status and demonstrates the ability to make significant improvements in function in a reasonable and predictable amount of  time.     Precautions / Restrictions Precautions Precautions: Fall Restrictions Weight Bearing Restrictions: No      Mobility  Bed Mobility Overal bed mobility: Needs Assistance Bed Mobility: Supine to Sit     Supine to sit: Modified independent (Device/Increase time)     General bed mobility comments: use of bedrails    Transfers Overall transfer level: Needs assistance Equipment used: Rolling walker (2 wheels) Transfers: Sit to/from Stand Sit to Stand: Supervision           General transfer comment: cues for proper hand placement, slow to rise .    Ambulation/Gait Ambulation/Gait assistance: Supervision Gait Distance (Feet): 100 Feet (+50, standing rest break in hallway given fatigue) Assistive device: Rolling walker (2 wheels) Gait Pattern/deviations: Step-through pattern, Decreased stride length, Trunk flexed Gait velocity: decr     General Gait Details: slowed, but steady with use of RW. Pt reporting fatigue and shortness of breath during gait, SpO2 95% and greater on RA. HR paced rhythm in 60s bpm  Stairs            Wheelchair Mobility    Modified Rankin (Stroke Patients Only)       Balance Overall balance assessment: Mild deficits observed, not formally tested                                           Pertinent Vitals/Pain Pain Assessment Pain Assessment: Faces Faces Pain Scale: Hurts a little bit Pain Location: chest, musculoskeletal (TTP, sore) Pain Descriptors / Indicators: Sore Pain Intervention(s): Limited activity within patient's tolerance, Monitored during session, Repositioned    Home Living Family/patient expects to be discharged to::  Private residence Living Arrangements: Children Available Help at Discharge: Family;Available 24 hours/day (Per Pt someone is always home) Type of Home: House Home Access: Ramped entrance       Home Layout: One level Home Equipment: Agricultural consultant (2 wheels);Cane -  single point;BSC/3in1;Grab bars - tub/shower;Grab bars - toilet;Shower seat;Hand held shower head      Prior Function Prior Level of Function : Independent/Modified Independent             Mobility Comments: pt reports mostly using cane in the house, RW if she goes into community. PT reports no falls ADLs Comments: No assist for BADLs, family cleans but pt is able to cook for self.     Hand Dominance   Dominant Hand: Right    Extremity/Trunk Assessment   Upper Extremity Assessment Upper Extremity Assessment: Defer to OT evaluation    Lower Extremity Assessment Lower Extremity Assessment: Generalized weakness    Cervical / Trunk Assessment Cervical / Trunk Assessment: Normal  Communication   Communication: No difficulties  Cognition Arousal/Alertness: Awake/alert Behavior During Therapy: WFL for tasks assessed/performed Overall Cognitive Status: Within Functional Limits for tasks assessed                                          General Comments      Exercises     Assessment/Plan    PT Assessment Patient needs continued PT services  PT Problem List Decreased strength;Decreased mobility;Decreased activity tolerance;Decreased balance;Decreased knowledge of use of DME;Pain       PT Treatment Interventions DME instruction;Therapeutic activities;Gait training;Therapeutic exercise;Patient/family education;Balance training;Stair training;Functional mobility training;Neuromuscular re-education    PT Goals (Current goals can be found in the Care Plan section)  Acute Rehab PT Goals PT Goal Formulation: With patient Time For Goal Achievement: 12/05/22 Potential to Achieve Goals: Good    Frequency Min 3X/week     Co-evaluation               AM-PAC PT "6 Clicks" Mobility  Outcome Measure Help needed turning from your back to your side while in a flat bed without using bedrails?: A Little Help needed moving from lying on your back to sitting  on the side of a flat bed without using bedrails?: A Little Help needed moving to and from a bed to a chair (including a wheelchair)?: A Little Help needed standing up from a chair using your arms (e.g., wheelchair or bedside chair)?: A Little Help needed to walk in hospital room?: A Little Help needed climbing 3-5 steps with a railing? : A Little 6 Click Score: 18    End of Session   Activity Tolerance: Patient tolerated treatment well Patient left: with call bell/phone within reach;in bed;with bed alarm set Nurse Communication: Mobility status PT Visit Diagnosis: Other abnormalities of gait and mobility (R26.89);Muscle weakness (generalized) (M62.81)    Time: [phone] (7 minutes not included in chargeable time) PT Time Calculation (min) (ACUTE ONLY): 33 min   Charges:   PT Evaluation $PT Eval Low Complexity: 1 Low PT Treatments $Therapeutic Activity: 8-22 mins        Marye Round, PT DPT Acute Rehabilitation Services Pager [phone]  Office [phone]   Treston Coker E Christain [place] 11/21/2022, 9:51 AM

## 2022-11-21 NOTE — Progress Notes (Signed)
FMTS Interim Progress Note  S: Into check on patient at bedside with Dr. Theodis Aguas.  She is resting comfortably in bed.  She wonders know if she could have some oatmeal, does not feel like she wants to be on a liquid diet anymore.  We discussed her events of today and yesterday.  She denies any chest pain, shortness of breath, palpitations.  All in all, she is feeling much better than she did earlier today.  O: BP (!) 150/66   Pulse 60   Temp 98.4 F (36.9 C) (Oral)   Resp 18   Ht 5' 2.01" (1.575 m)   Wt 65 kg   SpO2 96%   BMI 26.20 kg/m   General: Laying comfortably in bed CV: Regular rate and rhythm, tele shows paced rhythm at 60 bpm Respiratory: Normal work of breathing on room air  A/P: Arrhythmia, Takotsubo cardiomyopathy, history of complete heart block with ICD Reviewed notes and plan from daytime.  Rapid response called earlier today. S/p IV magnesium, Lopressor 25 mg.  Had QT prolonging medications discontinued. Clinically stable for now. Amiodarone infusion running Will monitor vital signs closely, discussed with cardiology as needed overnight  S/p laparoscopic lysis of adhesions/lap chole Per surgery team, continue clear liquid diet for now   Darral Dash, DO 11/21/2022, 9:37 PM PGY-2, [facility] Family Medicine Service pager [phone]

## 2022-11-21 NOTE — Evaluation (Signed)
Occupational Therapy Evaluation Patient Details Name: Mercedes Barr MRN: [id number] DOB: Apr 19, 1946 Today's Date: 11/21/2022   History of Present Illness 77 y.o. female who presented on 4/12 with abdominal pain, s/p lap chole on 4/13, s/p L Cardiac Cath 11/20/22. PMH of atrioventricular cushion defect repair age 78 complicated by CHB s/p pacemaker with revision of system in 1999 with last gen change 2018 ([employer]), longstanding persistent afib/flutter on Xarelto, arthritis, asthma, COPD, chronic HFpEF, DM, HTN, hypothyroidism, GERD   Clinical Impression   Pt admitted for above dx, PTA patient lived with daughter and son-in law,  Mod I in bADLs/iADLs other than cleaning that the family does. Pt also was Mod I in functional mobility, ambulating with SPC in home and RW in community. Pt currently completing functional ambulation with Min guard assist with RW, trialed no AD and patient Min guard HHA but noted dizziness with ambulation. Frequent cueing required for patient to maintain LUE precautions with transfers and bed mobility. Pt would benefit from continued acute skilled OT services to address above deficits and help transition to next level of care. Patient would benefit from post acute Home OT services to help maximize functional independence in natural environment      Recommendations for follow up therapy are one component of a multi-disciplinary discharge planning process, led by the attending physician.  Recommendations may be updated based on patient status, additional functional criteria and insurance authorization.   Assistance Recommended at Discharge Intermittent Supervision/Assistance  Patient can return home with the following A little help with walking and/or transfers;Assist for transportation;Assistance with cooking/housework;Help with stairs or ramp for entrance    Functional Status Assessment  Patient has had a recent decline in their functional status and demonstrates the  ability to make significant improvements in function in a reasonable and predictable amount of time.  Equipment Recommendations  None recommended by OT (Pt has recommended DME)    Recommendations for Other Services       Precautions / Restrictions Precautions Precautions: Fall;Other (comment) Precaution Comments: L cardiac cath, watch BP Restrictions Weight Bearing Restrictions: No      Mobility Bed Mobility Overal bed mobility: Needs Assistance Bed Mobility: Sit to Supine, Supine to Sit     Supine to sit: Modified independent (Device/Increase time) Sit to supine: Modified independent (Device/Increase time)   General bed mobility comments: bed rails. Cueing needed when scooting forward for LUE precautions    Transfers Overall transfer level: Needs assistance Equipment used: Rolling walker (2 wheels) Transfers: Sit to/from Stand Sit to Stand: Supervision           General transfer comment: cues for proper hand placement      Balance Overall balance assessment: Needs assistance Sitting-balance support: Feet supported Sitting balance-Leahy Scale: Good     Standing balance support: During functional activity Standing balance-Leahy Scale: Fair Standing balance comment: ambulated to door with HHA                           ADL either performed or assessed with clinical judgement   ADL Overall ADL's : Needs assistance/impaired Eating/Feeding: Supervision/ safety;Sitting   Grooming: Standing;Min guard   Upper Body Bathing: Sitting;Set up;Supervision/ safety   Lower Body Bathing: Min guard;Sitting/lateral leans   Upper Body Dressing : Sitting;Supervision/safety   Lower Body Dressing: Sitting/lateral leans;Min guard   Toilet Transfer: Min guard;Ambulation;Rolling walker (2 wheels)   Toileting- Clothing Manipulation and Hygiene: Min guard;Sitting/lateral lean   Tub/ Shower Transfer: Ambulation;Min  guard   Functional mobility during ADLs: Min  guard;Rolling walker (2 wheels) General ADL Comments: Pt also min guard HHA no AD     Vision         Perception     Praxis      Pertinent Vitals/Pain Pain Assessment Pain Assessment: 0-10 Pain Score: 7  Pain Location: Incision cite Pain Descriptors / Indicators: Sore Pain Intervention(s): Limited activity within patient's tolerance, Monitored during session, Premedicated before session     Hand Dominance Right   Extremity/Trunk Assessment Upper Extremity Assessment Upper Extremity Assessment: LUE deficits/detail;RUE deficits/detail RUE Deficits / Details: RUE WFL LUE Deficits / Details: cardiac cath precautions   Lower Extremity Assessment Lower Extremity Assessment: Generalized weakness   Cervical / Trunk Assessment Cervical / Trunk Assessment: Normal   Communication Communication Communication: No difficulties   Cognition Arousal/Alertness: Awake/alert Behavior During Therapy: WFL for tasks assessed/performed Overall Cognitive Status: Within Functional Limits for tasks assessed                                       General Comments  VSS on RA, Sp02 96%  HR to 63 with ambulation, Pt reported dizziness at end of session BP at 144/127.    Exercises     Shoulder Instructions      Home Living Family/patient expects to be discharged to:: Private residence Living Arrangements: Children Available Help at Discharge: Family;Available 24 hours/day (Per Pt someone is always home) Type of Home: House Home Access: Ramped entrance     Home Layout: One level     Bathroom Shower/Tub: Chief Strategy Officer: Handicapped height     Home Equipment: Agricultural consultant (2 wheels);Cane - single point;BSC/3in1;Grab bars - tub/shower;Grab bars - toilet;Shower seat;Hand held shower head          Prior Functioning/Environment Prior Level of Function : Independent/Modified Independent             Mobility Comments: pt reports mostly using  cane in the house, RW if she goes into community. PT reports no falls ADLs Comments: No assist for BADLs, family cleans but pt is able to cook for self.        OT Problem List: Impaired UE functional use;Impaired balance (sitting and/or standing);Decreased activity tolerance      OT Treatment/Interventions: Therapeutic activities;Therapeutic exercise;Self-care/ADL training;Energy conservation;DME and/or AE instruction;Patient/family education;Balance training    OT Goals(Current goals can be found in the care plan section) Acute Rehab OT Goals Patient Stated Goal: To get back to being able to walk without having someone with me OT Goal Formulation: With patient Time For Goal Achievement: 12/05/22 Potential to Achieve Goals: Fair ADL Goals Pt Will Perform Grooming: standing;with supervision Pt Will Transfer to Toilet: with modified independence;ambulating Additional ADL Goal #1: Pt will complete clothing retrieval and full body dressing with Supervision  OT Frequency: Min 2X/week    Co-evaluation              AM-PAC OT "6 Clicks" Daily Activity     Outcome Measure Help from another person eating meals?: None Help from another person taking care of personal grooming?: A Little Help from another person toileting, which includes using toliet, bedpan, or urinal?: A Little Help from another person bathing (including washing, rinsing, drying)?: A Little Help from another person to put on and taking off regular upper body clothing?: A Little Help from another person to  put on and taking off regular lower body clothing?: A Little 6 Click Score: 19   End of Session Equipment Utilized During Treatment: Gait belt;Rolling walker (2 wheels) Nurse Communication: Mobility status  Activity Tolerance: Patient tolerated treatment well Patient left: in bed;with call bell/phone within reach;with bed alarm set  OT Visit Diagnosis: Unsteadiness on feet (R26.81);Other abnormalities of gait and  mobility (R26.89)                Time: 8144-8185 OT Time Calculation (min): 22 min Charges:  OT General Charges $OT Visit: 1 Visit OT Evaluation $OT Eval Moderate Complexity: 1 Mod  11/21/2022  AB, OTR/L  Acute Rehabilitation Services  Office: [phone]   Tristan Schroeder 11/21/2022, 11:59 AM

## 2022-11-21 NOTE — Significant Event (Signed)
Rapid Response Event Note   Reason for Call :  Frequent 4-8 beat runs of NSVT  Initial Focused Assessment:  Patient is lying in bed.  She is alert and oriented.  She states that she has been mildly nauseated all day but otherwise feel much better after surgery.   When asked she states that she does feel her heart racing.  She denies chest pain or shortness of breath.  Lung sounds clear, heart tones irregular  BP 144/63  HR 80s  RR 18  O2 sat 93% Vpaced rhythm with frequent runs of 4-8 beat NSVT  Interventions:  Cardiology at bedside PPM interrogated 2gm Magnesium Labs drawn 25mg  Lopressor   Plan of Care:  RN to call if patient becomes symptomatic or if HR does not improve.  1721  Per RN HR had improved for a period of time now Patient complaining of chest pain, resolved after she coughed and burped. Again Vpaced with runs of NSVT Amiodarone bolus and gtt started   Event Summary:   MD Notified:  Family Medicine resident, Cardiology Call Time: 1414 Arrival Time: 1416 End Time: 1515  Marcellina Millin, RN

## 2022-11-21 NOTE — Progress Notes (Signed)
Cardiology Note:  Pt has continued to have more frequent episodes of nonsustained VT.  We added additional beta blocker ( metoprolol 25 PO) but this did not suppress her ectopy  I have discussed with family and the patient Will start IV amio ( load and infusion) To suppress her ectopy   She is hemodynamically stable at present  No indication to transfer to ICU at this point      Kristeen Miss, MD  11/21/2022 5:42 PM    [facility] [address],  [address] [place], [place]  [address] Phone: [phone]; Fax: [phone]

## 2022-11-21 NOTE — Progress Notes (Addendum)
Acute episode of ventricular tachycardia   I was notified at approximately 1430 today that patient was having multiple episodes of nonsustained V. Tach between 4-10 beats with heart rates fluctuating between 80-100 for the past 2 hrs.  Telemetry was reviewed and EKG was ordered which showed same findings.  Patient was evaluated at the bedside with Dr. Elease Hashimoto and Bettina Gavia, PA.  At the bedside patient was hemodynamically stable with complaints of palpitations and nausea; however, denied any complaints of chest pain or shortness of breath.  IV magnesium 2 g was ordered along with labs (CMP, CBC) and patient received one time dose of lopressor 25mg .  Her [employer] device was interrogated confirming ventricular ectopy in couplets/triplets. EP was consulted with recommendations of giving BB and deferring use of amio at this time.  Additionally, we have stopped all QT prolonging medications including Zofran and Celexa.  She continues to have nausea so primary team has changed Zofran to Compazine.

## 2022-11-21 NOTE — Anesthesia Postprocedure Evaluation (Signed)
Anesthesia Post Note  Patient: Mercedes Barr  Procedure(s) Performed: LAPAROSCOPIC CHOLECYSTECTOMY LAPAROSCOPIC LYSIS OF ADHESIONS     Anesthesia Type: General Anesthetic complications: no   No notable events documented.  Last Vitals:  Vitals:   11/21/22 0419 11/21/22 0749  BP: (!) 144/63 (!) 155/65  Pulse: 65 67  Resp: 18 18  Temp: 37.1 C 36.8 C  SpO2: 94% 96%    Last Pain:  Vitals:   11/21/22 0749  TempSrc: Oral  PainSc:      Late entry entered into record 10:30 am April 15th, 2024 from lap chole case on Saturday April 13th, 2024, please note that during emergence for a very brief seconds period patient had approx 7 beat run VT MD Hodierne present and magnet was placed on patient with paced rhythym and stable vital signs patient in no apparent distress, TOF 4/4 no fade with suggammadex, taken with FM Oxygen 8 lpm to recovery and reported to PACU RN magnet off patient in PACU and no change to EKG no signs of distress or ischemia, all questions answered MD Hodierne aware.             Mauricia Area

## 2022-11-21 NOTE — Progress Notes (Addendum)
   Heart Failure Stewardship Pharmacist Progress Note   PCP: Evelena Leyden, DO PCP-Cardiologist: Thurmon Fair, MD    HPI:  77 yo F with PMH of afib, CHB s/p PPM, asthma, CHF, COPD, GERD, T2DM, and HTN.   Presented to the ED on 4/12 with chest pain and abdominal pain. Ultrasound showed gallstones, surgery consulted. Planed cholecystectomy for acute cholecystitis, done on 4/13. Cardiology also consulted with rising troponin. Developed worsening chest pain with EKG changes on 4/14. ECHO showed EF 30-35% with regional wall motion abnormalities. Taken to cath lab and found to have mild to moderate obstructive disease, recommending medical management for Takotsubo cardiomyopathy. LVEDP 19.  Current HF Medications: Beta Blocker: carvedilol 6.25 mg BID ACE/ARB/ARNI: Entresto 24/26 mg BID SGLT2i: Jardiance 25 mg daily  Prior to admission HF Medications: Diuretic: furosemide 20 mg daily ACE/ARB/ARNI: losartan 25 mg daily SGLT2i: Jardiance 25 mg daily  Pertinent Lab Values: Serum creatinine 0.97, BUN 17, Potassium 4.1, Sodium 139  Vital Signs: Weight: 143 lbs (admission weight: 142 lbs) Blood pressure: 140-150/60s  Heart rate: 60s  I/O: net +1.3L  Medication Assistance / Insurance Benefits Check: Does the patient have prescription insurance?  Yes Type of insurance plan: [place] Medicaid  Outpatient Pharmacy:  Prior to admission outpatient [facility] Is the patient willing to use [facility] TOC pharmacy at discharge? Yes Is the patient willing to transition their outpatient pharmacy to utilize a [facility] outpatient pharmacy?   Pending    Assessment: 1. Acute systolic CHF (LVEF 30-35%), due to Takotsubo cardiomyopathy. NYHA class II symptoms. - Euvolemic on exam, may need PRN lasix for discharge - Agree with switching from PTA propranolol to carvedilol 6.25 mg BID - Agree with switching from losartan to Entresto 24/26 mg BID - Consider starting spironolactone prior to discharge - Agree  with resuming PTA Jardiance 25 mg daily  Plan: 1) Medication changes recommended at this time: - Agree with changes  2) Patient assistance: - Entresto copay $0  3)  Education  - Patient has been educated on current HF medications and potential additions to HF medication regimen - Patient verbalizes understanding that over the next few months, these medication doses may change and more medications may be added to optimize HF regimen - Patient has been educated on basic disease state pathophysiology and goals of therapy   Sharen Hones, PharmD, BCPS Heart Failure Stewardship Pharmacist Phone [phone]

## 2022-11-21 NOTE — Progress Notes (Signed)
     Daily Progress Note Intern Pager: [phone]  Patient name: Mercedes Barr Medical record number: [id number] Date of birth: 1945-11-28 Age: 77 y.o. Gender: female  Primary Care Provider: Evelena Leyden, DO Consultants: Cardiology, General Surgery  Code Status: Full Code   Pt Overview and Major Events to Date:  4/12 - Admitted  4/13 - Lap chole with General Surgery  4/14 - Chest pain with EKG changes,  LHC 1st Diag lesion is 80% stenosed, ECHO consistent w/ takotsobu  4/15 - Code status changed to Full Code per patient   Assessment and Plan: Mercedes Barr is a 77 y.o. female  p/w epigastric and RUQ pain found to have acute cholecystitis, now s/p lap chole (4/13). Hospital course complicated by chest pain found to have takotsobu cardiomyopathy.   PMhx includes complete heart block s/p ICD placement, Afib on Xarelto, and hx of AV Cushion repair in 1988.   * Takotsubo cardiomyopathy LHC from 4/14 with evidence of 80% stenosis in 1st diagonal. 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  - Cardiology following, started GDMT   - Entresto   - crestor   - carvedilol  - Heart failure following  - Crestor 20mg  daily - Continue telemetry   Abdominal pain Nausea continues to be persisting symptom and patient hs not been able to advance diet past clears. No more abdominal pain.   - Xarelto restarted as approved by general surgery  - Tylenol 650mg  q 6h - Continue PT and OT    Chronic Stable Conditions Afib - Continue xarelto  Essential tremor- continue propranolol 40mg  daily Depression- continue citalopram 20mg  daily Hypothyroidism- Synthroid daily    FEN/GI: Clear diet  PPx: Xarelto  Dispo:pending clinical improvement .   Subjective:  Patient reports nausea. Denies chest pain, shortness of breath.   Objective: Temp:  [97.8 F (36.6 C)-98.7 F (37.1 C)] 98.4 F (36.9 C) (04/15 1608) Pulse Rate:  [60-67] 60 (04/15 1608) Resp:  [17-18] 18 (04/15  1608) BP: (127-156)/(63-73) 156/69 (04/15 1608) SpO2:  [94 %-97 %] 94 % (04/15 1608) Weight:  [65 kg] 65 kg (04/15 0419) Physical Exam: General: In no acute distress, appears uncomfortable  Cardiovascular: RRR, cap refill < 2 seconds, radial pulses equal and palpable  Respiratory: No increased work of breathing on room air  Abdomen: Soft, mildly diffusely tender, non distended  Extremities: No BLE edema   Laboratory: Most recent CBC Lab Results  Component Value Date   WBC 7.5 11/21/2022   HGB 12.6 11/21/2022   HCT 41.5 11/21/2022   MCV 88.5 11/21/2022   PLT 203 11/21/2022   Most recent BMP    Latest Ref Rng & Units 11/21/2022    1:12 AM  BMP  Glucose 70 - 99 mg/dL 741   BUN 8 - 23 mg/dL 17   Creatinine 4.23 - 1.00 mg/dL 9.53   Sodium 202 - 334 mmol/L 139   Potassium 3.5 - 5.1 mmol/L 4.1   Chloride 98 - 111 mmol/L 102   CO2 22 - 32 mmol/L 26   Calcium 8.9 - 10.3 mg/dL 8.5     [name], MD 11/21/2022, 4:36 PM  PGY-1, [facility] FPTS Intern pager: [phone], text pages welcome Secure chat group [facility] Teaching Service

## 2022-11-21 NOTE — Progress Notes (Signed)
1 Day Post-Op   Subjective/Chief Complaint: No further CP this am. Having abdominal pain this am but she does think its more soreness and better from preop. Nausea is worse this am and she has been drinking clears and eating crackers. No emesis. Passing flatus. Last BM PTA. Has mobilized and states she felt tired quickly with this  She has itching with oxycodone and prefers hydrocodone  Objective: Vital signs in last 24 hours: Temp:  [97.6 F (36.4 C)-98.7 F (37.1 C)] 98.2 F (36.8 C) (04/15 0749) Pulse Rate:  [60-90] 67 (04/15 0749) Resp:  [17-30] 18 (04/15 0749) BP: (124-166)/(60-81) 155/65 (04/15 0749) SpO2:  [90 %-99 %] 96 % (04/15 0749) Weight:  [65 kg] 65 kg (04/15 0419) Last BM Date : 11/17/22  Intake/Output from previous day: 04/14 0701 - 04/15 0700 In: 663.3 [P.O.:297; I.V.:366.3] Out: 600 [Urine:600] Intake/Output this shift: Total I/O In: 60 [P.O.:60] Out: -   PE: General: pleasant, WD, female who is laying in bed in NAD Lungs: Respiratory effort nonlabored on room air Abd: soft, ND, +BS, expected TTP over incisions and greatest over umbilical incisions. Incisions cdi with surgical glue MSK: all 4 extremities are symmetrical with no cyanosis, clubbing, or edema. Skin: warm and dry Psych: A&Ox3 with an appropriate affect.     Lab Results:  Recent Labs    11/20/22 0112 11/21/22 0112  WBC 9.8 7.5  HGB 13.0 12.6  HCT 42.5 41.5  PLT 239 203    BMET Recent Labs    11/20/22 0112 11/21/22 0112  NA 135 139  K 4.2 4.1  CL 101 102  CO2 25 26  GLUCOSE 210* 148*  BUN 18 17  CREATININE 1.02* 0.97  CALCIUM 8.1* 8.5*       Latest Ref Rng & Units 11/21/2022    1:12 AM 11/20/2022    1:12 AM 11/19/2022   12:35 AM  Hepatic Function  Total Protein 6.5 - 8.1 g/dL 6.5  6.6  6.5   Albumin 3.5 - 5.0 g/dL 2.8  3.0  3.2   AST 15 - 41 U/L 28  43  21   ALT 0 - 44 U/L 30  33  26   Alk Phosphatase 38 - 126 U/L 48  47  46   Total Bilirubin 0.3 - 1.2 mg/dL 0.7  0.6   0.8      Studies/Results: ECHOCARDIOGRAM LIMITED  Result Date: 11/20/2022    ECHOCARDIOGRAM REPORT   Patient Name:   Mercedes Barr [facility] Date of Exam: 11/20/2022 Medical Rec #:  [id number]       Height:       62.0 in Accession #:    [id number]      Weight:       145.1 lb Date of Birth:  August 06, 1946        BSA:          1.668 m Patient Age:    77 years        BP:           na/na mmHg Patient Gender: F               HR:           Na bpm. Exam Location:  Inpatient Procedure: Limited Echo Indications:    Chest pain R07.9  History:        Patient has prior history of Echocardiogram examinations, most  recent 03/16/2021. COPD; Risk Factors:Dyslipidemia, Diabetes,                 Hypertension and Non-Smoker. Complete heart block.  Sonographer:    Dondra Prader RVT RCS Referring Phys: [id number] HEATHER E PEMBERTON IMPRESSIONS  1. Limited Echo to evaluate for LVEF and RWMA.  2. Limited echo windows, PLAX and SAX. Doppler analysis not performed.  3. Left ventricular ejection fraction, by estimation, is 30 to 35%. The left ventricle has moderately decreased function. RWMA assessment is limited. RWMA is present (please see the LV wall scoring below). No apical windows. Left ventricular diastolic function could not be evaluated. Comparison(s): Changes from prior study are noted. LVEF is worse now, 30-35% with RWMA. FINDINGS  Left Ventricle: Left ventricular ejection fraction, by estimation, is 30 to 35%. The left ventricle has moderately decreased function. The left ventricle demonstrates regional wall motion abnormalities. The left ventricular internal cavity size was normal in size. There is no left ventricular hypertrophy. Left ventricular diastolic function could not be evaluated.  LV Wall Scoring: The mid anteroseptal segment, mid anterolateral segment, mid inferoseptal segment, and mid anterior segment are akinetic. The posterior wall, basal anteroseptal segment, and mid inferior segment are hypokinetic. Right  Ventricle: The right ventricular size is not well visualized. Right vetricular wall thickness was not assessed. Right ventricular systolic function was not well visualized. Left Atrium: Left atrial size was not assessed. Right Atrium: Right atrial size was not assessed. Pericardium: There is no evidence of pericardial effusion. Mitral Valve: The mitral valve was not assessed. Tricuspid Valve: The tricuspid valve is not assessed. Aortic Valve: The aortic valve was not assessed. Pulmonic Valve: The pulmonic valve was not assessed. Aorta: Aortic root could not be assessed. Venous: The inferior vena cava was not well visualized. IAS/Shunts: The interatrial septum was not assessed.  LEFT VENTRICLE PLAX 2D LVIDd:         4.20 cm LVIDs:         3.05 cm LV PW:         1.00 cm LV IVS:        0.80 cm  Vishnu Priya Mallipeddi Electronically signed by Winfield Rast Mallipeddi Signature Date/Time: 11/20/2022/12:18:56 PM    Final    CARDIAC CATHETERIZATION  Result Date: 11/20/2022   Prox RCA lesion is 30% stenosed.   Prox LAD lesion is 40% stenosed.   1st Diag lesion is 80% stenosed. 1.  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 2.  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 Recommendations: The results were reviewed with Dr. Shari Prows and medical management will be pursued.    Anti-infectives: Anti-infectives (From admission, onward)    Start     Dose/Rate Route Frequency Ordered Stop   11/18/22 1000  cefTRIAXone (ROCEPHIN) 2 g in sodium chloride 0.9 % 100 mL IVPB  Status:  Discontinued        2 g 200 mL/hr over 30 Minutes Intravenous Every 24 hours 11/18/22 0834 11/20/22 9326       Assessment/Plan: Acute cholecystitis POD2 s/p laparoscopic lysis of adhesions/ lap chole 11/19/22 - Dr. Cliffton Asters   - CP and EKG changes POD1.  Cardiology following and [facility] 4/14. Okay for DAPT and heparin if neccessary - post op pain stable and overall abd pain better than preop. Ongoing nausea, +flatus. Continue CLD for now - WBC and LFTs WNL -  encouraged IS and mobilization - pain overall well controlled with tylenol. Needed dilaudid once yesterday. Consider addition of hydrocodone-acetaminophen if worsening pain and continue scheduled tylenol for now. PRN robaxin added  FEN: CLD, IVF per primary ID: rocephin periop VTE: xarelto held  - per medicine -  COPD CAD Complete heart block s/p PPM A. Fib on xarelto - last dose 4/11 AM. held HTN Hypothyroidism T2DM GERD   LOS: 1 day    Eric Form, [facility] Surgery 11/21/2022, 8:55 AM Please see Amion for pager number during day hours 7:00am-4:30pm

## 2022-11-21 NOTE — Progress Notes (Addendum)
Rounding Note    Patient Name: Mercedes Barr Date of Encounter: 11/21/2022  [facility] Cardiologist: Thurmon Fair, MD   Subjective   Patient without any complaints this morning.  Denies any chest pain, heart palpitations, increased shortness of breath, leg edema.  Previously had chest pain yesterday and was taken to the cath lab but now resolved.    Inpatient Medications    Scheduled Meds:  acetaminophen  650 mg Oral Q6H   citalopram  20 mg Oral q morning   fluticasone furoate-vilanterol  1 puff Inhalation Daily   insulin aspart  0-5 Units Subcutaneous QHS   insulin aspart  0-9 Units Subcutaneous TID WC   latanoprost  1 drop Both Eyes QHS   levothyroxine  112 mcg Oral Q0600   propranolol  40 mg Oral Daily   rosuvastatin  20 mg Oral Daily   sodium chloride flush  3 mL Intravenous Q12H   Continuous Infusions:  sodium chloride     PRN Meds: sodium chloride, HYDROmorphone (DILAUDID) injection, nitroGLYCERIN, ondansetron (ZOFRAN) IV, povidone-iodine, sodium chloride flush   Vital Signs    Vitals:   11/20/22 2110 11/21/22 0015 11/21/22 0419 11/21/22 0749  BP: 127/73 136/67 (!) 144/63 (!) 155/65  Pulse: 65 62 65 67  Resp: Temp: 98.6 F (37 C) 98.1 F (36.7 C) 98.7 F (37.1 C) 98.2 F (36.8 C)  TempSrc: Oral Oral Oral Oral  SpO2: 94% 95% 94% 96%  Weight:   65 kg   Height:        Intake/Output Summary (Last 24 hours) at 11/21/2022 0848 Last data filed at 11/21/2022 1610 Gross per 24 hour  Intake 723.34 ml  Output 600 ml  Net 123.34 ml      11/21/2022    4:19 AM 11/20/2022    5:38 AM 11/19/2022    7:58 AM  Last 3 Weights  Weight (lbs) 143 lb 4.8 oz 145 lb 1 oz 132 lb 4.4 oz  Weight (kg) 65 kg 65.8 kg 60 kg      Telemetry    Ventricularly paced.  Had a couple episodes of nonsustained V. tach of 4 beats- Personally Reviewed  ECG    Ventricular paced with deep T wave inversions in the precordial leads "new" - Personally  Reviewed  Physical Exam   GEN: No acute distress.   Neck: No JVD Cardiac: RRR, no murmurs, rubs, or gallops.  Respiratory: Clear to auscultation bilaterally. GI: Soft, nontender, non-distended  MS: No edema; No deformity. Neuro:  Nonfocal  Psych: Normal affect   Labs    High Sensitivity Troponin:   Recent Labs  Lab 11/19/22 1755 11/19/22 2008 11/19/22 2138 11/20/22 0824 11/20/22 1105  TROPONINIHS 68* 58* 56* 43* 91*     Chemistry Recent Labs  Lab 11/19/22 0035 11/20/22 0112 11/21/22 0112  NA 137 135 139  K 3.4* 4.2 4.1  CL 100 101 102  CO2 GLUCOSE 222* 210* 148*  BUN CREATININE 0.94 1.02* 0.97  CALCIUM 8.4* 8.1* 8.5*  PROT 6.5 6.6 6.5  ALBUMIN 3.2* 3.0* 2.8*  AST 21 43* 28  ALT 26 33 30  ALKPHOS 46 47 48  BILITOT 0.8 0.6 0.7  GFRNONAA >60 57* >60  ANIONGAP Lipids No results for input(s): "CHOL", "TRIG", "HDL", "LABVLDL", "LDLCALC", "CHOLHDL" in the last 168 hours.  Hematology Recent Labs  Lab 11/19/22 0035 11/20/22 0112 11/21/22 0112  WBC 11.3* 9.8 7.5  RBC 4.73 4.84 4.69  HGB 12.9 13.0 12.6  HCT 41.4 42.5 41.5  MCV 87.5 87.8 88.5  MCH 27.3 26.9 26.9  MCHC 31.2 30.6 30.4  RDW 13.8 13.7 13.6  PLT 201 239 203   Thyroid No results for input(s): "TSH", "FREET4" in the last 168 hours.  BNPNo results for input(s): "BNP", "PROBNP" in the last 168 hours.  DDimer No results for input(s): "DDIMER" in the last 168 hours.   Radiology    ECHOCARDIOGRAM LIMITED  Result Date: 11/20/2022    ECHOCARDIOGRAM REPORT   Patient Name:   KAITLINN PIECHOWSKI [facility] Date of Exam: 11/20/2022 Medical Rec #:  [id number]       Height:       62.0 in Accession #:    [id number]      Weight:       145.1 lb Date of Birth:  February 06, 1946        BSA:          1.668 m Patient Age:    77 years        BP:           na/na mmHg Patient Gender: F               HR:           Na bpm. Exam Location:  Inpatient Procedure: Limited Echo Indications:    Chest pain R07.9  History:         Patient has prior history of Echocardiogram examinations, most                 recent 03/16/2021. COPD; Risk Factors:Dyslipidemia, Diabetes,                 Hypertension and Non-Smoker. Complete heart block.  Sonographer:    Dondra Prader RVT RCS Referring Phys: [id number] HEATHER E PEMBERTON IMPRESSIONS  1. Limited Echo to evaluate for LVEF and RWMA.  2. Limited echo windows, PLAX and SAX. Doppler analysis not performed.  3. Left ventricular ejection fraction, by estimation, is 30 to 35%. The left ventricle has moderately decreased function. RWMA assessment is limited. RWMA is present (please see the LV wall scoring below). No apical windows. Left ventricular diastolic function could not be evaluated. Comparison(s): Changes from prior study are noted. LVEF is worse now, 30-35% with RWMA. FINDINGS  Left Ventricle: Left ventricular ejection fraction, by estimation, is 30 to 35%. The left ventricle has moderately decreased function. The left ventricle demonstrates regional wall motion abnormalities. The left ventricular internal cavity size was normal in size. There is no left ventricular hypertrophy. Left ventricular diastolic function could not be evaluated.  LV Wall Scoring: The mid anteroseptal segment, mid anterolateral segment, mid inferoseptal segment, and mid anterior segment are akinetic. The posterior wall, basal anteroseptal segment, and mid inferior segment are hypokinetic. Right Ventricle: The right ventricular size is not well visualized. Right vetricular wall thickness was not assessed. Right ventricular systolic function was not well visualized. Left Atrium: Left atrial size was not assessed. Right Atrium: Right atrial size was not assessed. Pericardium: There is no evidence of pericardial effusion. Mitral Valve: The mitral valve was not assessed. Tricuspid Valve: The tricuspid valve is not assessed. Aortic Valve: The aortic valve was not assessed. Pulmonic Valve: The pulmonic valve was not assessed.  Aorta: Aortic root could not be assessed. Venous: The inferior vena cava was not well visualized. IAS/Shunts: The interatrial septum was not assessed.  LEFT VENTRICLE PLAX 2D  LVIDd:         4.20 cm LVIDs:         3.05 cm LV PW:         1.00 cm LV IVS:        0.80 cm  Vishnu Priya Mallipeddi Electronically signed by Winfield Rast Mallipeddi Signature Date/Time: 11/20/2022/12:18:56 PM    Final    CARDIAC CATHETERIZATION  Result Date: 11/20/2022   Prox RCA lesion is 30% stenosed.   Prox LAD lesion is 40% stenosed.   1st Diag lesion is 80% stenosed. 1.  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 2.  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 Recommendations: The results were reviewed with Dr. Shari Prows and medical management will be pursued.    Cardiac Studies   Left heart catheterization 11/20/2022  Prox RCA lesion is 30% stenosed.   Prox LAD lesion is 40% stenosed.   1st Diag lesion is 80% stenosed.   1.  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 2.  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   Echocardiogram 11/20/2022 Comparison(s): Changes from prior study are noted. LVEF is worse now,  30-35% with RWMA.   FINDINGS   Left Ventricle: Left ventricular ejection fraction, by estimation, is 30  to 35%. The left ventricle has moderately decreased function. The left  ventricle demonstrates regional wall motion abnormalities. The left  ventricular internal cavity size was  normal in size. There is no left ventricular hypertrophy. Left ventricular  diastolic function could not be evaluated.     LV Wall Scoring:  The mid anteroseptal  segment, mid anterolateral segment, mid inferoseptal  segment, and mid anterior segment are akinetic. The posterior wall, basal  anteroseptal segment, and mid inferior segment are hypokinetic.   Right Ventricle: The right ventricular size is not well visualized. Right  vetricular wall thickness was not assessed. Right ventricular systolic  function was not well visualized.   Left Atrium: Left atrial size was not assessed.   Right Atrium: Right atrial size was not assessed.   Pericardium: There is no evidence of pericardial effusion.   Mitral Valve: The mitral valve was not assessed.   Tricuspid Valve: The tricuspid valve is not assessed.   Aortic Valve: The aortic valve was not assessed.   Pulmonic Valve: The pulmonic valve was not assessed.   Aorta: Aortic root could not be assessed.   Venous: The inferior vena cava was not well visualized.   IAS/Shunts: The interatrial septum was not assessed.    Patient Profile     77 y.o. 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  Troponins elevated at 43 and 91.  She was emergently taken into the Cath  Lab that showed no signs of an occlusion.  She had mild to moderate obstructive disease and a new reduced EF of 30 to 35% with wall motion abnormalities consistent with Takotsubo cardiomyopathy.  Prior echocardiogram in 2022 was 55 to 60%.  Patient not in any chest  pain now. Will plan to optimize GDMT. Restarting home Jardiance 25 mg daily Switching home losartan  to Entresto 24-26mg  daily Switching home propanolol  to carvedilol 6.25 BID Will need repeat echocardiogram in about 3 months Euvolemic on exam, discussed low sodium diet, and monitoring of fluids. Can send home on lasix  PRN for edema if needed.    Cholecystitis s/p lap cholecystectomy Underwent cholecystectomy on 11/19/2022.  General surgery following  Longstanding persistent A-fib/flutter Ventricular paced on the monitor.  Complains of slight shortness of breath however typical and at baseline per patient.  Patient not requiring medication to control rhythm.  Heart rate has been in the 60s throughout admission. Restart Xarelto 20 mg daily, will consult with general surgery for approval.   History of CHB status post PPM Ventricularly paced on telemetry  History of AV cushion defect repair at age 47 Echocardiogram did not assess.  Hyperlipidemia Continue Crestor 20 mg daily  Essential tremor   Switching home propanolol  to carvedilol 6.25 BID. Monitor for tremors and can switch back if needed.      For questions or updates, please contact  [facility] Please consult [email or web address] for contact info under        Signed, Abagail Kitchens, PA-C  11/21/2022, 8:48 AM    Attending Note:   The patient was seen and examined.  Agree with assessment and plan as noted above.  Changes made to the above note as needed.  Patient seen and independently examined with Yvonna Alanis, PA .   We discussed all aspects of the encounter. I agree with the assessment and plan as stated above.   Acute systolic and diastolic congestive heart failure: Patient developed acute CHF with apical akinesis.  She has normal coronary arteries.  Her findings are consistent with Takotsubo syndrome.  Ejection fraction is 30 to 35%.  She was previously on losartan.  I would favor starting her  on Entresto for the time being.  Will also switch her propranolol to carvedilol as this may help with her Takotsubo syndrome.  Will continue her home dose of Jardiance 25 mg a day.  2.  Status post laparoscopic cholecystectomy: She still on clear liquids.  She asked when she would be able to eat.  Will send a note to her primary doctor.  3 longstanding atrial fibrillation/atrial flutter: She is status post pacemaker placement.  Will restart her home dose of Xarelto when okay from the surgical standpoint.   I have spent a total of 40 minutes with patient reviewing hospital  notes , telemetry, EKGs, labs and examining patient as well as establishing an assessment and plan that was discussed with the patient.  > 50% of time was spent in direct patient care.   Vesta Mixer, Montez Hageman., MD, [place] 11/21/2022, 10:53 AM [address],  [address] Office [phone] Pager [phone]

## 2022-11-21 NOTE — Progress Notes (Signed)
Messaged by nurse that patient wanted to discuss CODE STATUS.  Upon visiting patient to discuss CODE STATUS patient says that she does not remember saying that she wanted to be DNR.  She says that she must have been out of it at that time but that she definitely wants to be full code.  She understands that this would include CPR (chest compressions, electric shock, medications to restart the heart) and most likely intubation.  She says that she understands what this entails and says that she would want every possible intervention offered to her and she understands that this would not be offered if she was DNR.  I acknowledged her wishes and said that I would change her to full code.  [name], MD  [facility]

## 2022-11-22 DIAGNOSIS — I5181 Takotsubo syndrome: Secondary | ICD-10-CM | POA: Diagnosis not present

## 2022-11-22 DIAGNOSIS — I493 Ventricular premature depolarization: Secondary | ICD-10-CM | POA: Diagnosis not present

## 2022-11-22 DIAGNOSIS — I4729 Other ventricular tachycardia: Secondary | ICD-10-CM

## 2022-11-22 DIAGNOSIS — E782 Mixed hyperlipidemia: Secondary | ICD-10-CM

## 2022-11-22 LAB — BASIC METABOLIC PANEL
Anion gap: 13 (ref 5–15)
BUN: 13 mg/dL (ref 8–23)
CO2: 22 mmol/L (ref 22–32)
Calcium: 8.5 mg/dL — ABNORMAL LOW (ref 8.9–10.3)
Chloride: 102 mmol/L (ref 98–111)
Creatinine, Ser: 0.87 mg/dL (ref 0.44–1.00)
GFR, Estimated: >60 mL/min (ref >60)
Glucose, Bld: 164 mg/dL — ABNORMAL HIGH (ref 70–99)
Potassium: 3.8 mmol/L (ref 3.5–5.1)
Sodium: 137 mmol/L (ref 135–145)

## 2022-11-22 LAB — GLUCOSE, CAPILLARY
Glucose-Capillary: 142 mg/dL — ABNORMAL HIGH (ref 70–99)
Glucose-Capillary: 140 mg/dL — ABNORMAL HIGH (ref 70–99)
Glucose-Capillary: 135 mg/dL — ABNORMAL HIGH (ref 70–99)
Glucose-Capillary: 158 mg/dL — ABNORMAL HIGH (ref 70–99)

## 2022-11-22 LAB — CBC
HCT: 48.7 % — ABNORMAL HIGH (ref 36.0–46.0)
Hemoglobin: 15.2 g/dL — ABNORMAL HIGH (ref 12.0–15.0)
MCH: 27 pg (ref 26.0–34.0)
MCHC: 31.2 g/dL (ref 30.0–36.0)
MCV: 86.7 fL (ref 80.0–100.0)
Platelets: 247 10*3/uL (ref 150–400)
RBC: 5.62 MIL/uL — ABNORMAL HIGH (ref 3.87–5.11)
RDW: 13.2 % (ref 11.5–15.5)
WBC: 9.4 10*3/uL (ref 4.0–10.5)
nRBC: 0 % (ref 0.0–0.2)

## 2022-11-22 LAB — SURGICAL PATHOLOGY

## 2022-11-22 LAB — MAGNESIUM: Magnesium: 2.4 mg/dL (ref 1.7–2.4)

## 2022-11-22 LAB — LIPOPROTEIN A (LPA): Lipoprotein (a): 93.1 nmol/L — ABNORMAL HIGH

## 2022-11-22 NOTE — Assessment & Plan Note (Addendum)
Patient continued to have runs of Vtach overnight. Hemodynamically stable otherwise. Unsure exact cause of ectopy. Most likely secondary to the takotsubo cardiomyopathy.  - Cardiology following, appreciate recommendations  - Continue amiodarone gtt  - Continue carvedilol  - Continue cardiac telemetry

## 2022-11-22 NOTE — Anesthesia Postprocedure Evaluation (Signed)
Anesthesia Post Note  Patient: Mercedes Barr  Procedure(s) Performed: LAPAROSCOPIC CHOLECYSTECTOMY LAPAROSCOPIC LYSIS OF ADHESIONS     Patient location during evaluation: PACU Anesthesia Type: General Level of consciousness: awake and alert Pain management: pain level controlled Vital Signs Assessment: post-procedure vital signs reviewed and stable Respiratory status: spontaneous breathing, nonlabored ventilation, respiratory function stable and patient connected to nasal cannula oxygen Cardiovascular status: blood pressure returned to baseline and stable Postop Assessment: no apparent nausea or vomiting Anesthetic complications: no   No notable events documented.  Last Vitals:  Vitals:   11/22/22 0600 11/22/22 0808  BP: 133/72 (!) 125/56  Pulse:  64  Resp:  17  Temp:  36.5 C  SpO2:  95%    Last Pain:  Vitals:   11/22/22 0808  TempSrc: Oral  PainSc:                  Mercedes Barr S

## 2022-11-22 NOTE — Progress Notes (Signed)
   Heart Failure Stewardship Pharmacist Progress Note   PCP: Evelena Leyden, DO PCP-Cardiologist: Thurmon Fair, MD    HPI:  77 yo F with PMH of afib, CHB s/p PPM, asthma, CHF, COPD, GERD, T2DM, and HTN.   Presented to the ED on 4/12 with chest pain and abdominal pain. Ultrasound showed gallstones, surgery consulted. Planed cholecystectomy for acute cholecystitis, done on 4/13. Cardiology also consulted with rising troponin. Developed worsening chest pain with EKG changes on 4/14. ECHO showed EF 30-35% with regional wall motion abnormalities. Taken to cath lab and found to have mild to moderate obstructive disease, recommending medical management for Takotsubo cardiomyopathy. LVEDP 19. Multiple episodes of NSVT on 4/15, not responding to metoprolol, started on IV amiodarone.  Current HF Medications: Beta Blocker: carvedilol 6.25 mg BID ACE/ARB/ARNI: Entresto 24/26 mg BID SGLT2i: Jardiance 25 mg daily  Prior to admission HF Medications: Diuretic: furosemide 20 mg daily ACE/ARB/ARNI: losartan 25 mg daily SGLT2i: Jardiance 25 mg daily  Pertinent Lab Values: Serum creatinine 0.87, BUN 13, Potassium 3.8, Sodium 137, Magnesium 2.4  Vital Signs: Weight: 140 lbs (admission weight: 142 lbs) Blood pressure: 120/50s  Heart rate: 60s  I/O: net +0.8L  Medication Assistance / Insurance Benefits Check: Does the patient have prescription insurance?  Yes Type of insurance plan: [place] Medicaid  Outpatient Pharmacy:  Prior to admission outpatient pharmacy: [facility] Is the patient willing to use [facility] pharmacy at discharge? Yes Is the patient willing to transition their outpatient pharmacy to utilize a [facility] outpatient pharmacy?   Pending    Assessment: 1. Acute systolic CHF (LVEF 30-35%), due to Takotsubo cardiomyopathy. NYHA class II symptoms. - Euvolemic on exam, may need PRN lasix for discharge. Keep K>4 and Mg>2 - Continue carvedilol 6.25 mg BID - Continue Entresto 24/26 mg BID -  Consider starting spironolactone prior to discharge - Continue Jardiance 25 mg daily  Plan: 1) Medication changes recommended at this time: - Continue current regimen  2) Patient assistance: - Entresto copay $0  3)  Education  - Patient has been educated on current HF medications and potential additions to HF medication regimen - Patient verbalizes understanding that over the next few months, these medication doses may change and more medications may be added to optimize HF regimen - Patient has been educated on basic disease state pathophysiology and goals of therapy   Sharen Hones, PharmD, BCPS Heart Failure Stewardship Pharmacist Phone [phone]

## 2022-11-22 NOTE — Progress Notes (Addendum)
FMTS Brief Progress Note  S: Saw patient's during night rounds.  She was laying comfortably in bed upon arrival and denies having any concerns.  Still endorses decreased appetite but tolerating p.o. intake.   O: BP 120/60   Pulse 66   Temp 98.6 F (37 C) (Oral)   Resp 18   Ht 5' 2.01" (1.575 m)   Wt 63.5 kg   SpO2 97%   BMI 25.62 kg/m    General: Awake, well appearing, NAD CV: RRR, no murmurs, normal S1/S2 Pulm: CTAB, good WOB on RA Abd: Soft, no distension, no tenderness    A/P: Overall stable, no concerns other than decreased appetite   Takotsubo cardiomyopathy Intermittent soft blood pressure but mostly stable with appropriate MAP and asymptomatic. -Cardiology following, appreciate recs -On Jardiance, Entresto, carvedilol.  Abdominal pain POD #3 of lap chole. Tolerating PO intake although decreased due to low appetite. Denies any abdominal pain.  -Tylenol 650 mg every 6 hours -Diet advanced to FLD.  Jerre Simon, MD 11/22/2022, 7:36 PM PGY-2, [facility] Night Resident  Please page [phone] with questions.

## 2022-11-22 NOTE — Progress Notes (Signed)
2 Days Post-Op   Subjective/Chief Complaint: Events yesterday/overnight noted. No chest pain currently. Abdominal pain stable post op. She continues to have nausea this am. She states she has nausea at baseline that she attributes to GERD though has not found any medications that improve it besides antiemetics. Some foods seem to make it worse. No emesis. Passing flatus and BM yesterday. Denies bloating  Objective: Vital signs in last 24 hours: Temp:  [97.7 F (36.5 C)-98.4 F (36.9 C)] 97.7 F (36.5 C) (04/16 0808) Pulse Rate:  [35-78] 64 (04/16 0808) Resp:  [17-18] 17 (04/16 0808) BP: (125-156)/(55-92) 125/56 (04/16 0808) SpO2:  [93 %-97 %] 95 % (04/16 0808) Weight:  [63.5 kg] 63.5 kg (04/16 0407) Last BM Date : 11/21/22  Intake/Output from previous day: 04/15 0701 - 04/16 0700 In: 971.5 [P.O.:600; I.V.:371.5] Out: 900 [Urine:900] Intake/Output this shift: Total I/O In: 0  Out: 250 [Urine:250]  PE: General: pleasant, WD, female who is laying in bed in NAD Lungs: Respiratory effort nonlabored on room air Abd: soft, ND, +BS, expected TTP over incisions and greatest over umbilical incision. Incisions cdi with surgical glue MSK: all 4 extremities are symmetrical with no cyanosis, clubbing, or edema. Skin: warm and dry Psych: A&Ox3 with an appropriate affect.     Lab Results:  Recent Labs    11/21/22 0112 11/22/22 0618  WBC 7.5 9.4  HGB 12.6 15.2*  HCT 41.5 48.7*  PLT 203 247    BMET Recent Labs    11/21/22 1546 11/22/22 0058  NA 136 137  K 3.9 3.8  CL 101 102  CO2 22 22  GLUCOSE 127* 164*  BUN 16 13  CREATININE 0.82 0.87  CALCIUM 8.2* 8.5*       Latest Ref Rng & Units 11/21/2022    3:46 PM 11/21/2022    1:12 AM 11/20/2022    1:12 AM  Hepatic Function  Total Protein 6.5 - 8.1 g/dL 6.5  6.5  6.6   Albumin 3.5 - 5.0 g/dL 2.9  2.8  3.0   AST 15 - 41 U/L 24  28  43   ALT 0 - 44 U/L 25  30  33   Alk Phosphatase 38 - 126 U/L 52  48  47   Total Bilirubin  0.3 - 1.2 mg/dL 1.1  0.7  0.6      Studies/Results: CARDIAC CATHETERIZATION  Result Date: 11/20/2022   Prox RCA lesion is 30% stenosed.   Prox LAD lesion is 40% stenosed.   1st Diag lesion is 80% stenosed. 1.  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 2.  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 Recommendations: The results were reviewed with Dr. Shari Prows and medical management will be pursued.    Anti-infectives: Anti-infectives (From admission, onward)    Start     Dose/Rate Route Frequency Ordered Stop   11/18/22 1000  cefTRIAXone (ROCEPHIN) 2 g in sodium chloride 0.9 % 100 mL IVPB  Status:  Discontinued        2 g 200 mL/hr over 30 Minutes Intravenous Every 24 hours 11/18/22 0834 11/20/22 1610       Assessment/Plan: Acute cholecystitis POD3 s/p laparoscopic lysis of adhesions/ lap chole 11/19/22 - Dr. Cliffton Asters   - CP and EKG changes POD1. Cardiology following and [facility] 4/14. Findings of takotsubo cardiomyopathy. Resumed xarelto 4/15. Episodes of vtach with  adjustments to BB and addition of amiodarone per cards - post op pain stable and overall abd pain better than preop. Ongoing nausea, +flatus, +BM. Has nausea at baseline. Prn antiemetic. Advance to FLD. - WBC and LFTs WNL last check - encouraged IS and mobilization - pain overall well controlled with tylenol. Consider addition of hydrocodone-acetaminophen if worsening pain and continue scheduled tylenol for now. PRN robaxin added  FEN: FLD, IVF per primary ID: rocephin periop VTE: xarelto held  - per medicine -  Takotsubo cardiomyopathy  COPD CAD Complete heart block s/p PPM A. Fib on xarelto - last dose 4/11 AM. held HTN Hypothyroidism T2DM GERD   LOS: 2 days    Eric Form, [facility] Surgery 11/22/2022, 8:46  AM Please see Amion for pager number during day hours 7:00am-4:30pm

## 2022-11-22 NOTE — Progress Notes (Signed)
   11/22/22 1058  Spiritual Encounters  Type of Visit Initial  Care provided to: Patient  Conversation partners present during encounter Nurse  Reason for visit Routine spiritual support  OnCall Visit No  Spiritual Framework  Presenting Themes Values and beliefs;Courage hope and growth;Rituals and practive  Values/beliefs Christian  Community/Connection Faith community  Strengths Faith in God as a presence  Needs/Challenges/Barriers loneliness  Patient Stress Factors None identified  Family Stress Factors None identified  Goals  Self/Personal Goals destress, recovery  Interventions  Spiritual Care Interventions Made Prayer;Compassionate presence;Established relationship of care and support;Encouragement  Intervention Outcomes  Outcomes Reduced anxiety;Reduced isolation  Spiritual Care Plan  Spiritual Care Issues Still Outstanding No further spiritual care needs at this time (see row info)   Visited patient per spiritual consult request. Prayed with patient and listened to patient's concerns about health issues. Patient showed signs of uplifting during visit and upon exiting. Patient asked that she have another visit later.

## 2022-11-22 NOTE — Progress Notes (Signed)
OT Cancellation Note  Patient Details Name: TIWANDA THREATS MRN: [id number] DOB: [birth year]   Cancelled Treatment:    Reason Eval/Treat Not Completed: Patient declined, no reason specified (Pt decline d/t ongoing nausea and dizziness today. OT will f/u as able)  11/22/2022  AB, OTR/L  Acute Rehabilitation Services  Office: [phone]  Tristan Schroeder 11/22/2022, 5:05 PM

## 2022-11-22 NOTE — Progress Notes (Signed)
Pt. Having multiple runs of VTach. On call for Cardiology paged to make aware.

## 2022-11-22 NOTE — Progress Notes (Signed)
Rounding Note    Patient Name: Mercedes Barr Date of Encounter: 11/22/2022  [facility] Cardiologist: Thurmon Fair, MD   Subjective   Patient was evaluated 2 times yesterday for recurrent episodes of nonsustained V. Tach.  With these episodes she has minor complaints of being nauseous and lightheaded and had some minor chest pain during the second episode however is doing much better today after being placed on an amnio drip and received a one-time dose of metoprolol 25 mg.  Throughout the night she still went in and out of V. Tach.  Currently denies any chest pain, heart palpitations, shortness of breath  Inpatient Medications    Scheduled Meds:  acetaminophen  650 mg Oral Q6H   carvedilol  6.25 mg Oral BID WC   empagliflozin  25 mg Oral Daily   FLUoxetine  10 mg Oral Daily   fluticasone furoate-vilanterol  1 puff Inhalation Daily   insulin aspart  0-5 Units Subcutaneous QHS   insulin aspart  0-9 Units Subcutaneous TID WC   latanoprost  1 drop Both Eyes QHS   levothyroxine  112 mcg Oral Q0600   rivaroxaban  20 mg Oral Q supper   rosuvastatin  20 mg Oral Daily   sacubitril-valsartan  1 tablet Oral BID   sodium chloride flush  3 mL Intravenous Q12H   Continuous Infusions:  sodium chloride     amiodarone 60 mg/hr (11/22/22 0452)   PRN Meds: sodium chloride, HYDROmorphone (DILAUDID) injection, methocarbamol, nitroGLYCERIN, polyethylene glycol, povidone-iodine, prochlorperazine, sodium chloride flush   Vital Signs    Vitals:   11/22/22 0030 11/22/22 0407 11/22/22 0600 11/22/22 0808  BP: (!) 148/55 (!) 145/73 133/72 (!) 125/56  Pulse: 60 63  64  Resp: Temp: 98.4 F (36.9 C) 98.4 F (36.9 C)  97.7 F (36.5 C)  TempSrc: Oral Oral  Oral  SpO2: 95% 97%  95%  Weight:  63.5 kg    Height:        Intake/Output Summary (Last 24 hours) at 11/22/2022 0821 Last data filed at 11/22/2022 0809 Gross per 24 hour  Intake 971.52 ml  Output 1150 ml  Net  -178.48 ml      11/22/2022    4:07 AM 11/21/2022    4:19 AM 11/20/2022    5:38 AM  Last 3 Weights  Weight (lbs) 140 lb 1.6 oz 143 lb 4.8 oz 145 lb 1 oz  Weight (kg) 63.549 kg 65 kg 65.8 kg      Telemetry    Ventricularly paced.  No bouts of V. tach read on today's telemetry- Personally Reviewed  ECG    Ventricularly paced with intermittent nonsustained VT lasting between 3-8 beats.  Left axis deviation.  Heart rate 121.  - Personally Reviewed  Physical Exam   GEN: Nauseous and lightheaded  Neck: No JVD Cardiac: RRR, no murmurs, rubs, or gallops.  Respiratory: Clear to auscultation bilaterally. GI: Soft, nontender, non-distended  MS: No edema; No deformity. Neuro:  Nonfocal  Psych: Normal affect   Labs    High Sensitivity Troponin:   Recent Labs  Lab 11/19/22 1755 11/19/22 2008 11/19/22 2138 11/20/22 0824 11/20/22 1105  TROPONINIHS 68* 58* 56* 43* 91*     Chemistry Recent Labs  Lab 11/20/22 0112 11/21/22 0112 11/21/22 1546 11/22/22 0058  NA 135 139 136 137  K 4.2 4.1 3.9 3.8  CL 101 102 101 102  CO2 GLUCOSE 210* 148* 127* 164*  BUN 18 17 16 13   CREATININE 1.02* 0.97 0.82 0.87  CALCIUM 8.1* 8.5* 8.2* 8.5*  MG  --   --  2.7* 2.4  PROT 6.6 6.5 6.5  --   ALBUMIN 3.0* 2.8* 2.9*  --   AST 43* 28 24  --   ALT 33 30 25  --   ALKPHOS 47 48 52  --   BILITOT 0.6 0.7 1.1  --   GFRNONAA 57* >60 >60 >60  ANIONGAP 9 11 13 13     Lipids No results for input(s): "CHOL", "TRIG", "HDL", "LABVLDL", "LDLCALC", "CHOLHDL" in the last 168 hours.  Hematology Recent Labs  Lab 11/20/22 0112 11/21/22 0112 11/22/22 0618  WBC 9.8 7.5 9.4  RBC 4.84 4.69 5.62*  HGB 13.0 12.6 15.2*  HCT 42.5 41.5 48.7*  MCV 87.8 88.5 86.7  MCH 26.9 26.9 27.0  MCHC 30.6 30.4 31.2  RDW 13.7 13.6 13.2  PLT 239 203 247   Thyroid No results for input(s): "TSH", "FREET4" in the last 168 hours.  BNPNo results for input(s): "BNP", "PROBNP" in the last 168 hours.  DDimer No  results for input(s): "DDIMER" in the last 168 hours.   Radiology    ECHOCARDIOGRAM LIMITED  Result Date: 11/20/2022    ECHOCARDIOGRAM REPORT   Patient Name:   Mercedes Barr [facility] Date of Exam: 11/20/2022 Medical Rec #:  [id number]       Height:       62.0 in Accession #:    [id number]      Weight:       145.1 lb Date of Birth:  October 22, 1945        BSA:          1.668 m Patient Age:    77 years        BP:           na/na mmHg Patient Gender: F               HR:           Na bpm. Exam Location:  Inpatient Procedure: Limited Echo Indications:    Chest pain R07.9  History:        Patient has prior history of Echocardiogram examinations, most                 recent 03/16/2021. COPD; Risk Factors:Dyslipidemia, Diabetes,                 Hypertension and Non-Smoker. Complete heart block.  Sonographer:    Dondra Prader RVT RCS Referring Phys: [id number] HEATHER E [place] IMPRESSIONS  1. Limited Echo to evaluate for LVEF and RWMA.  2. Limited echo windows, PLAX and SAX. Doppler analysis not performed.  3. Left ventricular ejection fraction, by estimation, is 30 to 35%. The left ventricle has moderately decreased function. RWMA assessment is limited. RWMA is present (please see the LV wall scoring below). No apical windows. Left ventricular diastolic function could not be evaluated. Comparison(s): Changes from prior study are noted. LVEF is worse now, 30-35% with RWMA. FINDINGS  Left Ventricle: Left ventricular ejection fraction, by estimation, is 30 to 35%. The left ventricle has moderately decreased function. The left ventricle demonstrates regional wall motion abnormalities. The left ventricular internal cavity size was normal in size. There is no left ventricular hypertrophy. Left ventricular diastolic function could not be evaluated.  LV Wall Scoring: The mid anteroseptal segment, mid anterolateral segment, mid inferoseptal segment, and mid anterior segment are akinetic. The posterior wall,  basal anteroseptal segment, and mid  inferior segment are hypokinetic. Right Ventricle: The right ventricular size is not well visualized. Right vetricular wall thickness was not assessed. Right ventricular systolic function was not well visualized. Left Atrium: Left atrial size was not assessed. Right Atrium: Right atrial size was not assessed. Pericardium: There is no evidence of pericardial effusion. Mitral Valve: The mitral valve was not assessed. Tricuspid Valve: The tricuspid valve is not assessed. Aortic Valve: The aortic valve was not assessed. Pulmonic Valve: The pulmonic valve was not assessed. Aorta: Aortic root could not be assessed. Venous: The inferior vena cava was not well visualized. IAS/Shunts: The interatrial septum was not assessed.  LEFT VENTRICLE PLAX 2D LVIDd:         4.20 cm LVIDs:         3.05 cm LV PW:         1.00 cm LV IVS:        0.80 cm  Vishnu Priya Mallipeddi Electronically signed by Winfield Rast Mallipeddi Signature Date/Time: 11/20/2022/12:18:56 PM    Final    CARDIAC CATHETERIZATION  Result Date: 11/20/2022   Prox RCA lesion is 30% stenosed.   Prox LAD lesion is 40% stenosed.   1st Diag lesion is 80% stenosed. 1.  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 2.  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 Recommendations: The results were reviewed with Dr. Shari Prows and medical management will be pursued.    Cardiac Studies   Left heart catheterization 11/20/2022  Prox RCA lesion is 30% stenosed.   Prox LAD lesion is 40% stenosed.   1st Diag lesion is 80% stenosed.   1.  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 2.  LVEDP of 19 mmHg with ventriculography demonstrating ejection fraction of approximately 30 to 35% with wall motion abnormalities consistent with Takotsubo  cardiomyopathy.   Echocardiogram 11/20/2022 Comparison(s): Changes from prior study are noted. LVEF is worse now,  30-35% with RWMA.   FINDINGS   Left Ventricle: Left ventricular ejection fraction, by estimation, is 30  to 35%. The left ventricle has moderately decreased function. The left  ventricle demonstrates regional wall motion abnormalities. The left  ventricular internal cavity size was  normal in size. There is no left ventricular hypertrophy. Left ventricular  diastolic function could not be evaluated.     LV Wall Scoring:  The mid anteroseptal segment, mid anterolateral segment, mid inferoseptal  segment, and mid anterior segment are akinetic. The posterior wall, basal  anteroseptal segment, and mid inferior segment are hypokinetic.   Right Ventricle: The right ventricular size is not well visualized. Right  vetricular wall thickness was not assessed. Right ventricular systolic  function was not well visualized.   Left Atrium: Left atrial size was not assessed.   Right Atrium: Right atrial size was not assessed.   Pericardium: There is no evidence of pericardial effusion.   Mitral Valve: The mitral valve was not assessed.   Tricuspid Valve: The tricuspid valve is not assessed.   Aortic Valve: The aortic valve was not assessed.   Pulmonic Valve: The pulmonic valve was not assessed.   Aorta: Aortic root could not be assessed.   Venous: The inferior vena cava was not well visualized.   IAS/Shunts: The interatrial septum was not assessed. 24.  Patient Profile  77 y.o. female  with a hx of atrioventricular cushion defect repair age 53 complicated by CHB s/p pacemaker with revision of system in 1999 with last gen change 2018 ([employer]), longstanding persistent afib/flutter on Xarelto, arthritis, asthma, COPD, chronic HFpEF, DM, HTN, hypothyroidism, GERD who was admitted 11/18/2022 and now has underwent lap chole and heart catheterization for acute onset of chest  pain   Assessment & Plan    Acute onset of chest pain with wall motion abnormalities on TTE in EKG inversions Takotsubo cardiomyopathy Hypertension Patient had abrupt onset of chest pain with T wave inversions in precordial leads.  Troponins elevated at 43 and 91.  She was emergently taken into the Cath Lab that showed no signs of an occlusion.  She had mild to moderate obstructive disease and a new reduced EF of 30 to 35% with wall motion abnormalities consistent with Takotsubo cardiomyopathy.  Prior echocardiogram in 2022 was 55 to 60%.  Patient not in any chest pain now. Will plan to optimize GDMT.  Patient with some minor improvement of BP after changes in antihypertensives. Continue home Jardiance 25 mg daily Continue Entresto 24-26mg  daily Continue Carvedilol 6.25 BID Will need repeat echocardiogram in about 3 months Euvolemic on exam, discussed low sodium diet, and monitoring of fluids. Can send home on lasix 40mg  PRN for edema if needed.    Nonsustained ventricular tachycardia  Ventricular ectopy Patient with new onset of ventricular tachycardia/ectopy starting yesterday 11/21/2022.  She was evaluated at bedside on 2 different occasions in which we started a one-time dose of Lopressor 25 mg and then subsequently added on amiodarone infusion due to another episode and worsening symptoms of chest pain and shortness of breath.  During these episodes she is hemodynamically stable.  Today she has not had any episodes of ventricular tachycardia on telemetry.  Now patient feeling much better on amiodarone and having minor complaints of nausea and lightheadedness, denies any chest pain or palpitations. Continue IV amiodarone infusion for now.  Consider consulting with EP about long-term plans for amiodarone or alternative therapies. We have stopped all QT prolonging medications including her Celexa and Zofran.  Zofran has been switched to Compazine. Magnesium 2.4, potassium 3.8  Cholecystitis  s/p lap cholecystectomy Underwent cholecystectomy on 11/19/2022.  General surgery following   Longstanding persistent A-fib/flutter Ventricular paced on the monitor.  Complains of slight shortness of breath however typical and at baseline per patient.  Patient not requiring medication to control rhythm.  Heart rate has been in the 60s throughout admission. Continue Xarelto 20 mg daily  History of CHB status post PPM Ventricularly paced on telemetry   History of AV cushion defect repair at age 40 Echocardiogram did not assess.   Hyperlipidemia Continue Crestor 20 mg daily   Essential tremor   Switched home propanolol 40mg  to carvedilol 6.25 BID. Monitor for tremors and can switch back if needed.   We have made the following medication changes during this admission: Switching home losartan 25mg  to Entresto 24-26mg  daily Switching home propanolol 40mg  to carvedilol 6.25 BID Stopped QT prolonging medications including Celexa and Zofran.  Zofran has been switched to Compazine.  For questions or updates, please contact [facility] Please consult [email or web address] for contact info under        Signed, Abagail Kitchens, PA-C  11/22/2022, 8:21 AM      Attending Note:   The patient was seen and examined.  Agree with assessment and plan as noted above.  Changes made to the above  note as needed.  Patient seen and independently examined with Yvonna Alanis, PA .   We discussed all aspects of the encounter. I agree with the assessment and plan as stated above.    Takotsubo syndrome-patient is now on Entresto 24-26 twice a day, carvedilol 6.25 twice a day.  Will expect her LV function to improve.  Continue Lasix as needed.  2.  Nonsustained ventricular tachycardia: Her nonsustained VT is quieted down quite a bit on amiodarone.  Will continue amiodarone IV today.  Will transition to oral amiodarone tomorrow. Magnesium level is 2.4.  Potassium is within normal range at 3.8.  3.  History of  persistent atrial fibrillation/atrial flutter: Continue Xarelto.  4.  Hyperlipidemia: Continue Crestor.        I have spent a total of 40 minutes with patient reviewing hospital  notes , telemetry, EKGs, labs and examining patient as well as establishing an assessment and plan that was discussed with the patient.  > 50% of time was spent in direct patient care.    Vesta Mixer, Montez Hageman., MD, [facility] 11/22/2022, 10:17 AM [address],  [address] Office [phone] Pager [phone]

## 2022-11-22 NOTE — Progress Notes (Signed)
Daily Progress Note Intern Pager: [phone]  Patient name: Mercedes Barr Medical record number: [id number] Date of birth: 12/26/1945 Age: 77 y.o. Gender: female  Primary Care Provider: Evelena Leyden, DO Consultants: General Surgery, Cardiology  Code Status: Full  Pt Overview and Major Events to Date:  4/12 - Admitted  4/13 - Lap chole with General Surgery  4/14 - Chest pain with EKG changes,  LHC 1st Diag lesion is 80% stenosed, ECHO consistent w/ takotsobu  4/15 - Code status changed to Full Code per patient, started on amiodarone gtt for ectopy   Assessment and Plan: Mercedes Barr is a 77 y.o. female  p/w epigastric and RUQ pain found to have acute cholecystitis, now s/p lap chole (4/13). Hospital course complicated by chest pain found to have takotsobu cardiomyopathy, now with ectopy.   * Takotsubo cardiomyopathy LHC from 4/14 with evidence of 80% stenosis in 1st diagonal. 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  - Cardiology following, started GDMT   - Entresto   - crestor   - carvedilol  - Heart failure following  - Crestor  daily - Continue telemetry   Abdominal pain Nausea continues to be persisting symptom and patient hs not been able to advance diet past clears. No more abdominal pain.   - Xarelto restarted as approved by general surgery  - Tylenol  q 6h - Continue PT and OT   Ventricular ectopy Patient continued to have runs of Vtach overnight. Hemodynamically stable otherwise. Unsure exact cause of ectopy. Most likely secondary to the takotsubo cardiomyopathy.  - Cardiology following, appreciate recommendations  - Continue amiodarone gtt  - Continue carvedilol  - Continue cardiac telemetry    Chronic Stable Conditions Afib - Continue xarelto  Essential tremor- discontinued propranolol, now on carvedilol 6.25 mg  Depression- continue citalopram  daily Hypothyroidism- Synthroid daily  FEN/GI: Clear diet PPx: On  home xarelto  Dispo:Pending clinical improvement.   Subjective:  Patient says that she feels nauseous and unwell. She is scared of what is happening with her heart. Her anxiety was allayed after we prayed together.   Objective: Temp:  [97.7 F (36.5 C)-98.4 F (36.9 C)] 97.7 F (36.5 C) (04/16 0808) Pulse Rate:  [35-78] 64 (04/16 0808) Resp:  [17-18] 17 (04/16 0808) BP: (125-156)/(55-92) 125/56 (04/16 0808) SpO2:  [93 %-97 %] 95 % (04/16 0808) Weight:  [63.5 kg] 63.5 kg (04/16 0407) Physical Exam: General: Appears chronically ill, in no acute distress  Cardiovascular: regular rate, abnormal rhythm on monitor, radial pulses equal and palpable, extremities appropriately warm  Respiratory: Normal work of breathing on room air  Abdomen: soft, non tender, non distended  Extremities: No BLE edema   Laboratory: Most recent CBC Lab Results  Component Value Date   WBC 9.4 11/22/2022   HGB 15.2 (H) 11/22/2022   HCT 48.7 (H) 11/22/2022   MCV 86.7 11/22/2022   PLT 247 11/22/2022   Most recent BMP    Latest Ref Rng & Units 11/22/2022   12:58 AM  BMP  Glucose 70 - 99 mg/dL 027   BUN 8 - 23 mg/dL 13   Creatinine 2.53 - 1.00 mg/dL 6.64   Sodium 403 - 474 mmol/L 137   Potassium 3.5 - 5.1 mmol/L 3.8   Chloride 98 - 111 mmol/L 102   CO2 22 - 32 mmol/L 22   Calcium 8.9 - 10.3 mg/dL 8.5     [name], MD 11/22/2022, 9:48 AM  PGY-1,  [place] Intern pager: [phone], text pages welcome Secure chat group [place]

## 2022-11-23 ENCOUNTER — Ambulatory Visit: Payer: 59 | Admitting: Family Medicine

## 2022-11-23 DIAGNOSIS — I5181 Takotsubo syndrome: Secondary | ICD-10-CM | POA: Diagnosis not present

## 2022-11-23 DIAGNOSIS — I472 Ventricular tachycardia, unspecified: Secondary | ICD-10-CM

## 2022-11-23 LAB — BASIC METABOLIC PANEL
Anion gap: 16 — ABNORMAL HIGH (ref 5–15)
BUN: 19 mg/dL (ref 8–23)
CO2: 15 mmol/L — ABNORMAL LOW (ref 22–32)
Calcium: 8.2 mg/dL — ABNORMAL LOW (ref 8.9–10.3)
Chloride: 103 mmol/L (ref 98–111)
Creatinine, Ser: 1.13 mg/dL — ABNORMAL HIGH (ref 0.44–1.00)
GFR, Estimated: 51 mL/min — ABNORMAL LOW (ref >60)
Glucose, Bld: 159 mg/dL — ABNORMAL HIGH (ref 70–99)
Potassium: 4.9 mmol/L (ref 3.5–5.1)
Sodium: 134 mmol/L — ABNORMAL LOW (ref 135–145)

## 2022-11-23 LAB — HEPATIC FUNCTION PANEL
ALT: 19 U/L (ref 0–44)
AST: 16 U/L (ref 15–41)
Albumin: 2.6 g/dL — ABNORMAL LOW (ref 3.5–5.0)
Alkaline Phosphatase: 55 U/L (ref 38–126)
Bilirubin, Direct: 0.1 mg/dL (ref 0.0–0.2)
Indirect Bilirubin: 1.5 mg/dL — ABNORMAL HIGH (ref 0.3–0.9)
Total Bilirubin: 1.6 mg/dL — ABNORMAL HIGH (ref 0.3–1.2)
Total Protein: 6.2 g/dL — ABNORMAL LOW (ref 6.5–8.1)

## 2022-11-23 LAB — GLUCOSE, CAPILLARY
Glucose-Capillary: 172 mg/dL — ABNORMAL HIGH (ref 70–99)
Glucose-Capillary: 181 mg/dL — ABNORMAL HIGH (ref 70–99)
Glucose-Capillary: 184 mg/dL — ABNORMAL HIGH (ref 70–99)
Glucose-Capillary: 152 mg/dL — ABNORMAL HIGH (ref 70–99)

## 2022-11-23 LAB — T4, FREE: Free T4: 1.72 ng/dL — ABNORMAL HIGH (ref 0.61–1.12)

## 2022-11-23 LAB — MAGNESIUM: Magnesium: 2.4 mg/dL (ref 1.7–2.4)

## 2022-11-23 LAB — TSH: TSH: 0.108 u[IU]/mL — ABNORMAL LOW (ref 0.350–4.500)

## 2022-11-23 MED ORDER — IRBESARTAN 75 MG PO TABS
37.5000 mg | ORAL_TABLET | Freq: Every day | ORAL | Status: DC
  Filled 2022-11-23: qty 0.5

## 2022-11-23 MED ORDER — AMIODARONE HCL 200 MG PO TABS
200.0000 mg | ORAL_TABLET | Freq: Every day | ORAL | Status: DC
  Administered 2022-11-23 – 2022-11-24 (×2): 200 mg via ORAL
  Filled 2022-11-23 (×2): qty 1

## 2022-11-23 NOTE — Consult Note (Addendum)
   [facility] Inpatient Consult   11/23/2022  CAMORA TREMAIN 1945/09/09 [id number]     Location: [facility] Liaison met with pt via bedside ([facility]).   [facility] Customer service manager [facility]) Accountable Care Organization [ACO] Patient: Environmental consultant)    Primary Care Provider:  Evelena Leyden, DO with [facility]  Patient screened for readmission hospitalization with noted medium risk score for unplanned readmission risk with 1 IP in 6 months. [facility]/Population Health RN liaison will assess for potential [facility]) Care Management service needs for post hospital transition for care coordination. Spoke with pt at bedside who verified PCP and supportive family (daughter and son-in-law in the home). Pt interested in a post hospital follow up call. Pt was given an appointment reminder card and 24 hours Nurse Advise Line magnet.   Plan. HIPAA verified and [facility]/Population Health RN Liaison will continue to follow ongoing disposition in assessing for post hospital community care coordination/management needs.  Referral request for community care coordination: pending disposition.   [facility] Care Management/Population Health does not replace or interfere with any arrangements made by the Inpatient Transition of Care team.   For questions contact:       Elliot Cousin, RN, BSN [facility] Liaison [place]   [facility]  Population Health Office Hours MTWF 8:00 am to 6 pm off on Thursday [phone] mobile [phone] [Office toll free line][facility] Office Hours are M-F 8:30 - 5 pm 24 hour nurse advise line [phone] Conceirge  [email or web address]

## 2022-11-23 NOTE — Progress Notes (Addendum)
Rounding Note    Patient Name: Mercedes Barr Date of Encounter: 11/23/2022  [facility] Cardiologist: Thurmon Fair, MD   Subjective   Patient continues to feel much improved.  Has no complaints at this time and no longer complaining of nausea.  Denies any chest pain, heart palpitations, shortness of breath.  No runs of V. tach noted on telemetry.  Inpatient Medications    Scheduled Meds:  acetaminophen  650 mg Oral Q6H   carvedilol  6.25 mg Oral BID WC   empagliflozin  25 mg Oral Daily   FLUoxetine  10 mg Oral Daily   fluticasone furoate-vilanterol  1 puff Inhalation Daily   insulin aspart  0-5 Units Subcutaneous QHS   insulin aspart  0-9 Units Subcutaneous TID WC   latanoprost  1 drop Both Eyes QHS   levothyroxine  112 mcg Oral Q0600   rivaroxaban  20 mg Oral Q supper   rosuvastatin  20 mg Oral Daily   sacubitril-valsartan  1 tablet Oral BID   sodium chloride flush  3 mL Intravenous Q12H   Continuous Infusions:  sodium chloride     amiodarone 60 mg/hr (11/23/22 0608)   PRN Meds: sodium chloride, HYDROmorphone (DILAUDID) injection, methocarbamol, nitroGLYCERIN, polyethylene glycol, povidone-iodine, prochlorperazine, sodium chloride flush   Vital Signs    Vitals:   11/22/22 1942 11/22/22 2353 11/23/22 0405 11/23/22 0805  BP: (!) 119/48 (!) 120/59 (!) 130/56 (!) 108/48  Pulse: 62  61 60  Resp: 18 16 16 16   Temp: 98.5 F (36.9 C) 98.1 F (36.7 C) 97.8 F (36.6 C) 97.8 F (36.6 C)  TempSrc: Oral Oral Oral Oral  SpO2: 97% 98% 99% 100%  Weight:      Height:        Intake/Output Summary (Last 24 hours) at 11/23/2022 0832 Last data filed at 11/23/2022 4696 Gross per 24 hour  Intake 1302.05 ml  Output 950 ml  Net 352.05 ml      11/22/2022    4:07 AM 11/21/2022    4:19 AM 11/20/2022    5:38 AM  Last 3 Weights  Weight (lbs) 140 lb 1.6 oz 143 lb 4.8 oz 145 lb 1 oz  Weight (kg) 63.549 kg 65 kg 65.8 kg      Telemetry    Ventricularly paced  without any signs of V. tach.  Heart rate 65.- Personally Reviewed  ECG    No new tracings.- Personally Reviewed  Physical Exam   GEN: No acute distress.   Neck: No JVD Cardiac: RRR,  very soft systolic murmur  Respiratory: Clear to auscultation bilaterally. GI: Soft, nontender, non-distended  MS: No edema; No deformity. Neuro:  Nonfocal  Psych: Normal affect   Labs    High Sensitivity Troponin:   Recent Labs  Lab 11/19/22 1755 11/19/22 2008 11/19/22 2138 11/20/22 0824 11/20/22 1105  TROPONINIHS 68* 58* 56* 43* 91*     Chemistry Recent Labs  Lab 11/20/22 0112 11/21/22 0112 11/21/22 1546 11/22/22 0058 11/23/22 0041  NA 135 139 136 137 134*  K 4.2 4.1 3.9 3.8 4.9  CL 101 102 101 102 103  CO2 25 26 22 22  15*  GLUCOSE 210* 148* 127* 164* 159*  BUN 18 17 16 13 19   CREATININE 1.02* 0.97 0.82 0.87 1.13*  CALCIUM 8.1* 8.5* 8.2* 8.5* 8.2*  MG  --   --  2.7* 2.4 2.4  PROT 6.6 6.5 6.5  --   --   ALBUMIN 3.0* 2.8* 2.9*  --   --  AST 43* 28 24  --   --   ALT 33 30 25  --   --   ALKPHOS 47 48 52  --   --   BILITOT 0.6 0.7 1.1  --   --   GFRNONAA 57* >60 >60 >60 51*  ANIONGAP 16*    Lipids No results for input(s): "CHOL", "TRIG", "HDL", "LABVLDL", "LDLCALC", "CHOLHDL" in the last 168 hours.  Hematology Recent Labs  Lab 11/20/22 0112 11/21/22 0112 11/22/22 0618  WBC 9.8 7.5 9.4  RBC 4.84 4.69 5.62*  HGB 13.0 12.6 15.2*  HCT 42.5 41.5 48.7*  MCV 87.8 88.5 86.7  MCH 26.9 26.9 27.0  MCHC 30.6 30.4 31.2  RDW 13.7 13.6 13.2  PLT 239 203 247   Thyroid No results for input(s): "TSH", "FREET4" in the last 168 hours.  BNPNo results for input(s): "BNP", "PROBNP" in the last 168 hours.  DDimer No results for input(s): "DDIMER" in the last 168 hours.   Radiology    No results found.  Cardiac Studies   Left heart catheterization 11/20/2022  Prox RCA lesion is 30% stenosed.   Prox LAD lesion is 40% stenosed.   1st Diag lesion is 80% stenosed.   1.   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 2.  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   Echocardiogram 11/20/2022 Comparison(s): Changes from prior study are noted. LVEF is worse now,  30-35% with RWMA.   FINDINGS   Left Ventricle: Left ventricular ejection fraction, by estimation, is 30  to 35%. The left ventricle has moderately decreased function. The left  ventricle demonstrates regional wall motion abnormalities. The left  ventricular internal cavity size was  normal in size. There is no left ventricular hypertrophy. Left ventricular  diastolic function could not be evaluated.     LV Wall Scoring:  The mid anteroseptal segment, mid anterolateral segment, mid inferoseptal  segment, and mid anterior segment are akinetic. The posterior wall, basal  anteroseptal segment, and mid inferior segment are hypokinetic.   Right Ventricle: The right ventricular size is not well visualized. Right  vetricular wall thickness was not assessed. Right ventricular systolic  function was not well visualized.   Left Atrium: Left atrial size was not assessed.   Right Atrium: Right atrial size was not assessed.   Pericardium: There is no evidence of pericardial effusion.   Mitral Valve: The mitral valve was not assessed.   Tricuspid Valve: The tricuspid valve is not assessed.   Aortic Valve: The aortic valve was not assessed.   Pulmonic Valve: The pulmonic valve was not assessed.   Aorta: Aortic root could not be assessed.   Venous: The inferior vena cava was not well visualized.   IAS/Shunts: The interatrial septum was not assessed. 63.  Patient Profile     77 y.o. female with a hx of atrioventricular cushion defect repair age 32 complicated by CHB s/p pacemaker with revision of system in 1999 with last  gen change 2018 ([employer]tr[employer]), longstanding persistent afib/flutter on Xarelto, arthritis, asthma, COPD, chronic HFpEF, DM, HTN, hypothyroidism, GERD who was admitted 11/18/2022 and now has underwent lap chole and heart catheterization for acute onset of chest pain   Assessment & Plan    Expand All Collapse All      Rounding Note      Patient Name:  Mercedes Barr Date of Encounter: 11/22/2022   [facility] Cardiologist: Thurmon Fair, MD    Subjective    Patient was evaluated 2 times yesterday for recurrent episodes of nonsustained V. Tach.  With these episodes she has minor complaints of being nauseous and lightheaded and had some minor chest pain during the second episode however is doing much better today after being placed on an amnio drip and received a one-time dose of metoprolol 25 mg.  Throughout the night she still went in and out of V. Tach.  Currently denies any chest pain, heart palpitations, shortness of breath   Inpatient Medications    Scheduled Meds:  acetaminophen  650 mg Oral Q6H   carvedilol  6.25 mg Oral BID WC   empagliflozin  25 mg Oral Daily   FLUoxetine  10 mg Oral Daily   fluticasone furoate-vilanterol  1 puff Inhalation Daily   insulin aspart  0-5 Units Subcutaneous QHS   insulin aspart  0-9 Units Subcutaneous TID WC   latanoprost  1 drop Both Eyes QHS   levothyroxine  112 mcg Oral Q0600   rivaroxaban  20 mg Oral Q supper   rosuvastatin  20 mg Oral Daily   sacubitril-valsartan  1 tablet Oral BID   sodium chloride flush  3 mL Intravenous Q12H    Continuous Infusions:  sodium chloride     amiodarone 60 mg/hr (11/22/22 0452)    PRN Meds: sodium chloride, HYDROmorphone (DILAUDID) injection, methocarbamol, nitroGLYCERIN, polyethylene glycol, povidone-iodine, prochlorperazine, sodium chloride flush    Vital Signs          Vitals:    11/22/22 0030 11/22/22 0407 11/22/22 0600 11/22/22 0808  BP: (!) 148/55 (!) 145/73 133/72 (!) 125/56  Pulse:  60 63   64  Resp: 18 18   17   Temp: 98.4 F (36.9 C) 98.4 F (36.9 C)   97.7 F (36.5 C)  TempSrc: Oral Oral   Oral  SpO2: 95% 97%   95%  Weight:   63.5 kg      Height:              Intake/Output Summary (Last 24 hours) at 11/22/2022 0821 Last data filed at 11/22/2022 0809    Gross per 24 hour  Intake 971.52 ml  Output 1150 ml  Net -178.48 ml        11/22/2022    4:07 AM 11/21/2022    4:19 AM 11/20/2022    5:38 AM  Last 3 Weights  Weight (lbs) 140 lb 1.6 oz 143 lb 4.8 oz 145 lb 1 oz  Weight (kg) 63.549 kg 65 kg 65.8 kg       Telemetry    Ventricularly paced.  No bouts of V. tach read on today's telemetry- Personally Reviewed   ECG    Ventricularly paced with intermittent nonsustained VT lasting between 3-8 beats.  Left axis deviation.  Heart rate 121.  - Personally Reviewed   Physical Exam    GEN: Nauseous and lightheaded  Neck: No JVD Cardiac: RRR, no murmurs, rubs, or gallops.  Respiratory: Clear to auscultation bilaterally. GI: Soft, nontender, non-distended  MS: No edema; No deformity. Neuro:  Nonfocal  Psych: Normal affect    Labs    High Sensitivity Troponin:   Last Labs         Recent Labs  Lab 11/19/22 1755 11/19/22 2008 11/19/22 2138 11/20/22 0824 11/20/22 1105  TROPONINIHS 68* 58* 56* 43* 91*       Chemistry  Last Labs        Recent Labs  Lab 11/20/22 0112 11/21/22 0112 11/21/22 1546 11/22/22 0058  NA 135 139 136 137  K 4.2 4.1 3.9 3.8  CL 101 102 101 102  CO2 25 26 22 22   GLUCOSE 210* 148* 127* 164*  BUN 18 17 16 13   CREATININE 1.02* 0.97 0.82 0.87  CALCIUM 8.1* 8.5* 8.2* 8.5*  MG  --   --  2.7* 2.4  PROT 6.6 6.5 6.5  --   ALBUMIN 3.0* 2.8* 2.9*  --   AST 43* 28 24  --   ALT 33 30 25  --   ALKPHOS 47 48 52  --   BILITOT 0.6 0.7 1.1  --   GFRNONAA 57* >60 >60 >60  ANIONGAP 9 11 13 13       Lipids  Last Labs  No results for input(s): "CHOL", "TRIG", "HDL", "LABVLDL", "LDLCALC", "CHOLHDL" in the last 168 hours.     Hematology Last Labs       Recent Labs  Lab 11/20/22 0112 11/21/22 0112 11/22/22 0618  WBC 9.8 7.5 9.4  RBC 4.84 4.69 5.62*  HGB 13.0 12.6 15.2*  HCT 42.5 41.5 48.7*  MCV 87.8 88.5 86.7  MCH 26.9 26.9 27.0  MCHC 30.6 30.4 31.2  RDW 13.7 13.6 13.2  PLT 239 203 247      Thyroid  Last Labs  No results for input(s): "TSH", "FREET4" in the last 168 hours.    BNP Last Labs  No results for input(s): "BNP", "PROBNP" in the last 168 hours.    DDimer  Last Labs  No results for input(s): "DDIMER" in the last 168 hours.      Radiology     Imaging Results (Last 48 hours)  ECHOCARDIOGRAM LIMITED   Result Date: 11/20/2022    ECHOCARDIOGRAM REPORT   Patient Name:   Mercedes Barr [facility] Date of Exam: 11/20/2022 Medical Rec #:  [id number]       Height:       62.0 in Accession #:    [id number]      Weight:       145.1 lb Date of Birth:  09-11-1945        BSA:          1.668 m Patient Age:    76 years        BP:           na/na mmHg Patient Gender: F               HR:           Na bpm. Exam Location:  Inpatient Procedure: Limited Echo Indications:    Chest pain R07.9  History:        Patient has prior history of Echocardiogram examinations, most                 recent 03/16/2021. COPD; Risk Factors:Dyslipidemia, Diabetes,                 Hypertension and Non-Smoker. Complete heart block.  Sonographer:    Dondra Prader RVT RCS Referring Phys: [id number] HEATHER E [place] IMPRESSIONS  1. Limited Echo to evaluate for LVEF and RWMA.  2. Limited echo windows, PLAX and SAX. Doppler analysis not performed.  3. Left ventricular ejection fraction, by estimation, is 30 to 35%. The left ventricle has moderately decreased function. RWMA assessment is limited. RWMA is present (please see the LV wall scoring below). No apical windows.  Left ventricular diastolic function could not be evaluated. Comparison(s): Changes from prior study are noted. LVEF is worse now, 30-35% with RWMA. FINDINGS  Left Ventricle: Left ventricular  ejection fraction, by estimation, is 30 to 35%. The left ventricle has moderately decreased function. The left ventricle demonstrates regional wall motion abnormalities. The left ventricular internal cavity size was normal in size. There is no left ventricular hypertrophy. Left ventricular diastolic function could not be evaluated.  LV Wall Scoring: The mid anteroseptal segment, mid anterolateral segment, mid inferoseptal segment, and mid anterior segment are akinetic. The posterior wall, basal anteroseptal segment, and mid inferior segment are hypokinetic. Right Ventricle: The right ventricular size is not well visualized. Right vetricular wall thickness was not assessed. Right ventricular systolic function was not well visualized. Left Atrium: Left atrial size was not assessed. Right Atrium: Right atrial size was not assessed. Pericardium: There is no evidence of pericardial effusion. Mitral Valve: The mitral valve was not assessed. Tricuspid Valve: The tricuspid valve is not assessed. Aortic Valve: The aortic valve was not assessed. Pulmonic Valve: The pulmonic valve was not assessed. Aorta: Aortic root could not be assessed. Venous: The inferior vena cava was not well visualized. IAS/Shunts: The interatrial septum was not assessed.  LEFT VENTRICLE PLAX 2D LVIDd:         4.20 cm LVIDs:         3.05 cm LV PW:         1.00 cm LV IVS:        0.80 cm  Vishnu Priya Mallipeddi Electronically signed by Winfield Rast Mallipeddi Signature Date/Time: 11/20/2022/12:18:56 PM    Final     CARDIAC CATHETERIZATION   Result Date: 11/20/2022   Prox RCA lesion is 30% stenosed.   Prox LAD lesion is 40% stenosed.   1st Diag lesion is 80% stenosed. 1.  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 2.  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  Recommendations: The results were reviewed with Dr. Shari Prows and medical management will be pursued.       Cardiac Studies    Left heart catheterization 11/20/2022  Prox RCA lesion is 30% stenosed.   Prox LAD lesion is 40% stenosed.   1st Diag lesion is 80% stenosed.   1.  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 2.  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   Echocardiogram 11/20/2022 Comparison(s): Changes from prior study are noted. LVEF is worse now,  30-35% with RWMA.   FINDINGS   Left Ventricle: Left ventricular ejection fraction, by estimation, is 30  to 35%. The left ventricle has moderately decreased function. The left  ventricle demonstrates regional wall motion abnormalities. The left  ventricular internal cavity size was  normal in size. There is no left ventricular hypertrophy. Left ventricular  diastolic function could not be evaluated.     LV Wall Scoring:  The mid anteroseptal segment, mid anterolateral segment, mid inferoseptal  segment, and mid anterior segment are akinetic. The posterior wall, basal  anteroseptal segment, and mid inferior segment are hypokinetic.   Right Ventricle: The right ventricular size is not well visualized. Right  vetricular wall thickness was not assessed. Right ventricular systolic  function was not well visualized.  Left Atrium: Left atrial size was not assessed.   Right Atrium: Right atrial size was not assessed.   Pericardium: There is no evidence of pericardial effusion.   Mitral Valve: The mitral valve was not assessed.   Tricuspid Valve: The tricuspid valve is not assessed.   Aortic Valve: The aortic valve was not assessed.   Pulmonic Valve: The pulmonic valve was not assessed.   Aorta: Aortic root could not be assessed.    Venous: The inferior vena cava was not well visualized.   IAS/Shunts: The interatrial septum was not assessed. 62.   Patient Profile     77 y.o. female  with a hx of atrioventricular cushion defect repair age 89 complicated by CHB s/p pacemaker with revision of system in 1999 with last gen change 2018 ([employer]), longstanding persistent afib/flutter on Xarelto, arthritis, asthma, COPD, chronic HFpEF, DM, HTN, hypothyroidism, GERD who was admitted 11/18/2022 and now has underwent lap chole and heart catheterization for acute onset of chest pain    Assessment & Plan    Acute onset of chest pain with wall motion abnormalities on TTE in EKG inversions Takotsubo cardiomyopathy Hypertension Patient had abrupt onset of chest pain with T wave inversions in precordial leads.  Troponins elevated at 43 and 91.  She was emergently taken into the Cath Lab that showed no signs of an occlusion.  She had mild to moderate obstructive disease and a new reduced EF of 30 to 35% with wall motion abnormalities consistent with Takotsubo cardiomyopathy.  Prior echocardiogram in 2022 was 55 to 60%.  Patient not in any chest pain now. Will plan to optimize GDMT.   Patients blood pressure continues to improve since admission and with new titration of GDMT.  Systolics generally around 120. Continue home Jardiance 25 mg daily Holding Entresto 24-26mg  daily for today due to increase in Cr and potassium. Will consult with MD Continue Carvedilol 6.25 BID Will need repeat echocardiogram in about 3 months Euvolemic on exam, discussed low sodium diet, and monitoring of fluids. Can send home on lasix 40mg  PRN for edema if needed.    Nonsustained ventricular tachycardia  Ventricular ectopy Patient had new onset of ventricular tachycardia/ectopy on 11/21/2022.  She was evaluated at bedside on 2 different occasions in which we started a one-time dose of Lopressor 25 mg and then subsequently added on amiodarone infusion due to  another episode and worsening symptoms of chest pain and shortness of breath.  During these episodes she is hemodynamically stable.  She has not had any episodes of ventricular tachycardia on telemetry since starting the amio and HR well controlled now.  Patient feeling much better on amiodarone and does not have any complaints currently.  Will transition from IV amio to oral.  Consider consulting with EP about long-term plans for amiodarone or alternative therapies. Will check baseline LFTs and TFTs We have stopped all QT prolonging medications including her Celexa and Zofran.  Zofran has been switched to Compazine. Potassium 4.9 magnesium 2.4  AKI Patient with mild AKI with previous normal renal function prior to admission.  Creatinine 1.13, GFR 51. Potassium has gone from 3.8-4.9.   Cholecystitis s/p lap cholecystectomy Underwent cholecystectomy on 11/19/2022.  General surgery following   Longstanding persistent A-fib/flutter Ventricular paced on the monitor.  Complains of slight shortness of breath however typical and at baseline per patient.  Patient not requiring medication to control rhythm.  Heart rate has been in the 60s throughout admission. Continue Xarelto 20 mg daily. May require  reduced dosing now due to reduced CrCl, will consult with MD.   History of CHB status post PPM Ventricularly paced on telemetry   History of AV cushion defect repair at age 64 Echocardiogram did not assess.   Hyperlipidemia Continue Crestor 20 mg daily   Essential tremor   Switched home propanolol 40mg  to carvedilol 6.25 BID. Monitor for tremors and can switch back if needed.    We have made the following medication changes during this admission: Switching home losartan 25mg  to Entresto 24-26mg  daily Switching home propanolol 40mg  to carvedilol 6.25 BID Stopped QT prolonging medications including Celexa and Zofran.  Zofran has been switched to Compazine.    For questions or updates, please  contact [facility] Please consult [email or web address] for contact info under        Signed, Abagail Kitchens, PA-C  11/23/2022, 8:32 AM     Attending Note:   The patient was seen and examined.  Agree with assessment and plan as noted above.  Changes made to the above note as needed.  Patient seen and independently examined with [place], [place] .   We discussed all aspects of the encounter. I agree with the assessment and plan as stated above.    Takotsubo syndrome:   feeling better.  Her appetite is better.  She is has a borderline hypertension.  Her creatinine bumped slightly.  Will hold the Entresto.  Will anticipate starting irbesartan tomorrow at 37.5 mg a day.  Continue carvedilol.   Nonsustained ventricular tachycardia: Her nonsustained VT has improved significantly on IV amiodarone.  Will transition her to oral amiodarone 2 mg a day.   I have spent a total of 40 minutes with patient reviewing hospital  notes , telemetry, EKGs, labs and examining patient as well as establishing an assessment and plan that was discussed with the patient.  > 50% of time was spent in direct patient care.    Vesta Mixer, Montez Hageman., MD, [facility] 11/23/2022, 11:06 AM [address]  [address] Office [phone] Pager [phone]

## 2022-11-23 NOTE — Care Management Important Message (Signed)
Important Message  Patient Details  Name: Mercedes Barr MRN: [id number] Date of Birth: [birth year]   Medicare Important Message Given:  Yes     Renie Ora 11/23/2022, 10:07 AM

## 2022-11-23 NOTE — Progress Notes (Signed)
3 Days Post-Op   Subjective/Chief Complaint: Still having some nausea but seems to be better - did not need antiemetics yesterday and tolerated full liquids. BM yesterday. Abdominal pain is improved and she only has tenderness with palpation now  Objective: Vital signs in last 24 hours: Temp:  [97.8 F (36.6 C)-98.6 F (37 C)] 97.8 F (36.6 C) (04/17 0805) Pulse Rate:  [60-66] 60 (04/17 0805) Resp:  [16-18] 16 (04/17 0805) BP: (108-130)/(48-60) 108/48 (04/17 0805) SpO2:  [96 %-100 %] 100 % (04/17 0805) Last BM Date : 11/22/22  Intake/Output from previous day: 04/16 0701 - 04/17 0700 In: 1161.5 [P.O.:540; I.V.:621.5] Out: 900 [Urine:900] Intake/Output this shift: Total I/O In: 260.5 [P.O.:120; I.V.:140.5] Out: 300 [Urine:300]  PE: General: pleasant, WD, female who is laying in bed in NAD Lungs: Respiratory effort nonlabored on room air Abd: soft, ND, mild TTP over incisions. Incisions cdi with surgical glue MSK: all 4 extremities are symmetrical with no cyanosis, clubbing, or edema. Skin: warm and dry Psych: A&Ox3 with an appropriate affect.     Lab Results:  Recent Labs    11/21/22 0112 11/22/22 0618  WBC 7.5 9.4  HGB 12.6 15.2*  HCT 41.5 48.7*  PLT 203 247    BMET Recent Labs    11/22/22 0058 11/23/22 0041  NA 137 134*  K 3.8 4.9  CL 102 103  CO2 22 15*  GLUCOSE 164* 159*  BUN 13 19  CREATININE 0.87 1.13*  CALCIUM 8.5* 8.2*       Latest Ref Rng & Units 11/21/2022    3:46 PM 11/21/2022    1:12 AM 11/20/2022    1:12 AM  Hepatic Function  Total Protein 6.5 - 8.1 g/dL 6.5  6.5  6.6   Albumin 3.5 - 5.0 g/dL 2.9  2.8  3.0   AST 15 - 41 U/L 24  28  43   ALT 0 - 44 U/L 25  30  33   Alk Phosphatase 38 - 126 U/L 52  48  47   Total Bilirubin 0.3 - 1.2 mg/dL 1.1  0.7  0.6      Studies/Results: No results found.  Anti-infectives: Anti-infectives (From admission, onward)    Start     Dose/Rate Route Frequency Ordered Stop   11/18/22 1000   cefTRIAXone (ROCEPHIN) 2 g in sodium chloride 0.9 % 100 mL IVPB  Status:  Discontinued        2 g 200 mL/hr over 30 Minutes Intravenous Every 24 hours 11/18/22 0834 11/20/22 1610       Assessment/Plan: Acute cholecystitis POD4 s/p laparoscopic lysis of adhesions/ lap chole 11/19/22 - Dr. Cliffton Asters   - CP and EKG changes POD1. Cardiology following and [facility] 4/14. Findings of takotsubo cardiomyopathy. Resumed xarelto 4/15. Episodes of vtach with adjustments to BB and addition of amiodarone per cards - pain and nausea improved. Having bowel function. Advance to soft diet - WBC and LFTs WNL last check - encouraged IS and mobilization - pain overall well controlled with tylenol. Consider addition of hydrocodone-acetaminophen if worsening pain and continue scheduled tylenol for now. PRN robaxin added  FEN: soft, IVF per primary ID: rocephin periop VTE: xarelto 4/15>  - per medicine -  Takotsubo cardiomyopathy  COPD CAD Complete heart block s/p PPM A. Fib on xarelto - last dose 4/11 AM. held HTN Hypothyroidism T2DM GERD   LOS: 3 days    Eric Form, [facility] Surgery 11/23/2022, 9:31 AM Please see Amion for pager number  during day hours 7:00am-4:30pm

## 2022-11-23 NOTE — Progress Notes (Signed)
Physical Therapy Treatment Patient Details Name: Mercedes Barr MRN: [id number] DOB: May 24, 1946 Today's Date: 11/23/2022   History of Present Illness 77 y.o. female who presented on 4/12 with abdominal pain, s/p lap chole on 4/13, s/p L Cardiac Cath 11/20/22. PMH of atrioventricular cushion defect repair age 32 complicated by CHB s/p pacemaker with revision of system in 1999 with last gen change 2018 ([employer]), longstanding persistent afib/flutter on Xarelto, arthritis, asthma, COPD, chronic HFpEF, DM, HTN, hypothyroidism, GERD    PT Comments    Pt tolerated treatment well today. Pt was able to progress ambulation in hallway today with RW at supervision level. Pt anticipates DC home today. No change in DC/DME recs at this time. PT will continue to follow.   Recommendations for follow up therapy are one component of a multi-disciplinary discharge planning process, led by the attending physician.  Recommendations may be updated based on patient status, additional functional criteria and insurance authorization.  Follow Up Recommendations       Assistance Recommended at Discharge Intermittent Supervision/Assistance  Patient can return home with the following A little help with walking and/or transfers;A little help with bathing/dressing/bathroom   Equipment Recommendations  None recommended by PT    Recommendations for Other Services       Precautions / Restrictions Precautions Precautions: Fall;Other (comment) Precaution Comments: L cardiac cath, watch BP Restrictions Weight Bearing Restrictions: No     Mobility  Bed Mobility Overal bed mobility: Modified Independent Bed Mobility: Sit to Supine, Supine to Sit     Supine to sit: Modified independent (Device/Increase time), HOB elevated Sit to supine: Modified independent (Device/Increase time), HOB elevated        Transfers Overall transfer level: Needs assistance Equipment used: Rolling walker (2 wheels) Transfers: Sit  to/from Stand Sit to Stand: Supervision           General transfer comment: cues for proper hand placement    Ambulation/Gait Ambulation/Gait assistance: Supervision Gait Distance (Feet): 300 Feet Assistive device: Rolling walker (2 wheels) Gait Pattern/deviations: Step-through pattern, Decreased stride length, Trunk flexed Gait velocity: decr     General Gait Details: Pt with slow steady gait. Pt reported fatigue halfway through but was able to make it back to room.   Stairs             Wheelchair Mobility    Modified Rankin (Stroke Patients Only)       Balance Overall balance assessment: Mild deficits observed, not formally tested                                          Cognition Arousal/Alertness: Awake/alert Behavior During Therapy: WFL for tasks assessed/performed, Flat affect Overall Cognitive Status: Within Functional Limits for tasks assessed                                          Exercises      General Comments General comments (skin integrity, edema, etc.): VSS on RA      Pertinent Vitals/Pain Pain Assessment Pain Assessment: No/denies pain    Home Living                          Prior Function  PT Goals (current goals can now be found in the care plan section) Progress towards PT goals: Progressing toward goals    Frequency    Min 1X/week      PT Plan Current plan remains appropriate    Co-evaluation              AM-PAC PT "6 Clicks" Mobility   Outcome Measure  Help needed turning from your back to your side while in a flat bed without using bedrails?: None Help needed moving from lying on your back to sitting on the side of a flat bed without using bedrails?: None Help needed moving to and from a bed to a chair (including a wheelchair)?: A Little Help needed standing up from a chair using your arms (e.g., wheelchair or bedside chair)?: A Little Help needed  to walk in hospital room?: A Little Help needed climbing 3-5 steps with a railing? : A Little 6 Click Score: 20    End of Session Equipment Utilized During Treatment: Gait belt Activity Tolerance: Patient tolerated treatment well Patient left: with call bell/phone within reach;in bed Nurse Communication: Mobility status PT Visit Diagnosis: Other abnormalities of gait and mobility (R26.89);Muscle weakness (generalized) (M62.81)     Time: 5409-8119 PT Time Calculation (min) (ACUTE ONLY): 9 min  Charges:  $Gait Training: 8-22 mins                     Shela Nevin, PT, DPT Acute Rehab Services [phone]    Mercedes Barr 11/23/2022, 12:16 PM

## 2022-11-23 NOTE — Progress Notes (Signed)
Occupational Therapy Treatment Patient Details Name: Mercedes Barr MRN: [id number] DOB: 1946/01/13 Today's Date: 11/23/2022   History of present illness 77 y.o. female who presented on 4/12 with abdominal pain, s/p lap chole on 4/13, s/p L Cardiac Cath 11/20/22. PMH of atrioventricular cushion defect repair age 48 complicated by CHB s/p pacemaker with revision of system in 1999 with last gen change 2018 ([employer]), longstanding persistent afib/flutter on Xarelto, arthritis, asthma, COPD, chronic HFpEF, DM, HTN, hypothyroidism, GERD   OT comments  Pt participated in ADL retraining session today with focus on bed mobility (supervision-Mod I); grooming sitting up at EOB (set-up required. Pt was initially going to stand/sit at sink but then declined when sitting up at EOB secondary to overall fatigue); UB bathing (set-up, min A to wash back), LB bathing Min guard sitting with lateral leans and dressing (UB/LB min guard-Min A). Pt declined OOB activity secondary to overall fatigue as limiting factor. Pt will benefit from continued acute OT to assist in maximizing independence with ADL's and functional transfers.   Recommendations for follow up therapy are one component of a multi-disciplinary discharge planning process, led by the attending physician.  Recommendations may be updated based on patient status, additional functional criteria and insurance authorization.    Assistance Recommended at Discharge Intermittent Supervision/Assistance  Patient can return home with the following  A little help with walking and/or transfers;Assist for transportation;Assistance with cooking/housework;Help with stairs or ramp for entrance   Equipment Recommendations  None recommended by OT (Pt has necessary DME)    Recommendations for Other Services      Precautions / Restrictions Precautions Precautions: Fall;Other (comment) Precaution Comments: L cardiac cath, watch BP       Mobility Bed Mobility Overal  bed mobility: Needs Assistance Bed Mobility: Sit to Supine, Supine to Sit     Supine to sit: Supervision, Modified independent (Device/Increase time), HOB elevated Sit to supine: Modified independent (Device/Increase time)     Patient Response: Flat affect  Transfers Overall transfer level:  (Pt declined OOB transfers secondary to fatigue)       Balance Overall balance assessment: Needs assistance Sitting-balance support: Feet supported, Feet unsupported Sitting balance-Leahy Scale: Good     ADL either performed or assessed with clinical judgement   ADL Overall ADL's : Needs assistance/impaired Eating/Feeding: Supervision/ safety;Sitting   Grooming: Wash/dry hands;Wash/dry face;Oral care;Sitting;Supervision/safety;Set up Grooming Details (indicate cue type and reason): Sitting up at EOB as pt reports fatigue as limiting factor. Pt reports that she is not able to perform ADL's at sink level today. Upper Body Bathing: Minimal assistance;Sitting;Set up Upper Body Bathing Details (indicate cue type and reason): Min A to wash back. Fatigue is limiting factor overall Lower Body Bathing: Min guard;Set up;Sitting/lateral leans   Upper Body Dressing : Sitting;Min guard Upper Body Dressing Details (indicate cue type and reason): Donning clean gown sitting up at EOB Lower Body Dressing: Sitting/lateral leans;Minimal assistance Lower Body Dressing Details (indicate cue type and reason): Don/doff socks during ADL's while sitting up at [employer] Transfer Details (indicate cue type and reason): Pt declined   Functional mobility during ADLs:  (Pt declined OOB activity and transfers today citing fatigue as limiting factor overall) General ADL Comments: Pt participated in ADL retraining session today with focus on bed mobility (supervision-Mod I); grooming sitting up at EOB (set-up required. Pt was initially going to stand/sit at sink but then declined when sitting up at EOB secondary to  overall fatigue); UB bathing (set-up, min A to  wash back), LB bathing Min guard sitting with lateral leans and dressing (UB/LB min guard-Min A). Pt declined OOB activity secondary to overall fatigue as limiting factor. Pt will benefit from continued acute OT to assist in maximizing independence with ADL's and functional transfers.    Extremity/Trunk Assessment Upper Extremity Assessment Upper Extremity Assessment: Defer to OT evaluation;Generalized weakness;Overall WFL for tasks assessed RUE Deficits / Details: RUE WFL LUE Deficits / Details: cardiac cath precautions   Lower Extremity Assessment Lower Extremity Assessment: Defer to PT evaluation   Cervical / Trunk Assessment Cervical / Trunk Assessment: Normal    Vision Ability to See in Adequate Light: 0 Adequate Patient Visual Report: No change from baseline Vision Assessment?: No apparent visual deficits          Cognition Arousal/Alertness: Awake/alert Behavior During Therapy: WFL for tasks assessed/performed, Flat affect Overall Cognitive Status: Within Functional Limits for tasks assessed     General Comments: Pt reports generalized fatigue as limiting factor              General Comments VSS on RA, SpO2 at 100%, HR to 66 in sitting at EOB during ADL's    Pertinent Vitals/ Pain       Pain Assessment Pain Assessment: No/denies pain (Reports fatigue)  Home Living  Refer to initial OT Eval for details    Prior Functioning/Environment  Refer to initial OT eval for details   Frequency  Min 2X/week        Progress Toward Goals  OT Goals(current goals can now be found in the care plan section)  Progress towards OT goals: Progressing toward goals  Acute Rehab OT Goals Patient Stated Goal: Not be so tired OT Goal Formulation: With patient Time For Goal Achievement: 12/05/22 Potential to Achieve Goals: Fair  Plan Discharge plan remains appropriate       AM-PAC OT "6 Clicks" Daily Activity     Outcome  Measure   Help from another person eating meals?: None Help from another person taking care of personal grooming?: A Little Help from another person toileting, which includes using toliet, bedpan, or urinal?: A Little Help from another person bathing (including washing, rinsing, drying)?: A Little Help from another person to put on and taking off regular upper body clothing?: A Little Help from another person to put on and taking off regular lower body clothing?: A Little 6 Click Score: 19    End of Session  In bed, resting  OT Visit Diagnosis: Unsteadiness on feet (R26.81);Other abnormalities of gait and mobility (R26.89)   Activity Tolerance Patient tolerated treatment well;Patient limited by fatigue   Patient Left in bed;with call bell/phone within reach   Nurse Communication Mobility status;Other (comment) (Fatigue, ADL's completed)        Time: 7829-5621 OT Time Calculation (min): 27 min  Charges: OT General Charges $OT Visit: 1 Visit OT Treatments $Self Care/Home Management : 23-37 mins   Alm Bustard, OTR/L 11/23/2022, 9:36 AM

## 2022-11-23 NOTE — Progress Notes (Addendum)
Daily Progress Note Intern Pager: [phone]  Patient name: Mercedes Barr Medical record number: [id number] Date of birth: 1946-04-21 Age: 77 y.o. Gender: female  Primary Care Provider: Evelena Leyden, DO Consultants: General Surgery, Cardiology  Code Status: Full   Pt Overview and Major Events to Date:  4/12 - Admitted  4/13 - Lap chole with General Surgery  4/14 - Chest pain with EKG changes,  LHC 1st Diag lesion is 80% stenosed, ECHO consistent w/ takotsobu  4/15 - Code status changed to Full Code per patient, started on amiodarone gtt for ectopy   Assessment and Plan: Mercedes Barr is a 77 y.o. female  p/w epigastric and RUQ pain found to have acute cholecystitis, now s/p lap chole (4/13). 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   * Takotsubo cardiomyopathy LHC from 4/14 with evidence of 80% stenosis in 1st diagonal. 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  - Cardiology following, continuing GDMT   - crestor   - carvedilol  - Heart failure following  - Crestor  daily - Continue telemetry  - Hold entresto and lasix for now, having softer pressures and increased Cr.   Ventricular ectopy Much more stable overnight. No runs of Vtach that I could tell.  - Cardiology following, appreciate recommendations  - Will transition from amiodarone gtt to po per cardiology  - Continue carvedilol  - Continue cardiac telemetry   Nausea POD# 4 s/p lap chole with general surgery. Now on full liquid diet, but continues to have nausea. Most likely secondary to arrhythmia.  - Compazine q 6 prn  - Advance diet as able    Chronic Stable Conditions Afib - Continue xarelto  Essential tremor- carvedilol 6.25 mg  Depression- continue citalopram  daily Hypothyroidism- Synthroid daily  FEN/GI: Soft diet  PPx: On home xarelto  Dispo: pending clinical improvement  Subjective:   Patient says she feels much better today. Says she does not have nausea any longer. Denies chest pain or shortness of breath. Agrees to drinking more fluids.   Objective: Temp:  [97.7 F (36.5 C)-98.6 F (37 C)] 97.7 F (36.5 C) (04/17 1114) Pulse Rate:  [60-66] 60 (04/17 1114) Resp:  [16-18] 16 (04/17 1114) BP: (102-130)/(48-60) 102/49 (04/17 1114) SpO2:  [97 %-100 %] 98 % (04/17 1114) Physical Exam: General: Chronically ill appearing  Cardiovascular: RRR, II/VI systolic murmur at left sternal border, possible S3 gallop, cap refill < 2 seconds Respiratory: Normal work of breathing on room ai r Abdomen: Soft, tender to palpation of RLQ, no guarding or rebound guarding, non distended  Extremities: No BLE edema   Laboratory: Most recent CBC Lab Results  Component Value Date   WBC 9.4 11/22/2022   HGB 15.2 (H) 11/22/2022   HCT 48.7 (H) 11/22/2022   MCV 86.7 11/22/2022   PLT 247 11/22/2022   Most recent BMP    Latest Ref Rng & Units 11/23/2022   12:41 AM  BMP  Glucose 70 - 99 mg/dL 147   BUN 8 - 23 mg/dL 19   Creatinine 8.29 - 1.00 mg/dL 5.62   Sodium 130 - 865 mmol/L 134   Potassium 3.5 - 5.1 mmol/L 4.9   Chloride 98 - 111 mmol/L 103   [id number]- 32 mmol/L 15   Calcium 8.9 - 10.3 mg/dL 8.2    [name], MD 11/23/2022, 11:58 AM  PGY-1, [facility] FPTS Intern pager:  [phone], text pages welcome Secure chat group [place]

## 2022-11-23 NOTE — Progress Notes (Signed)
   Heart Failure Stewardship Pharmacist Progress Note   PCP: Evelena Leyden, DO PCP-Cardiologist: Thurmon Fair, MD    HPI:  77 yo F with PMH of afib, CHB s/p PPM, asthma, CHF, COPD, GERD, T2DM, and HTN.   Presented to the ED on 4/12 with chest pain and abdominal pain. Ultrasound showed gallstones, surgery consulted. Planed cholecystectomy for acute cholecystitis, done on 4/13. Cardiology also consulted with rising troponin. Developed worsening chest pain with EKG changes on 4/14. ECHO showed EF 30-35% with regional wall motion abnormalities. Taken to cath lab and found to have mild to moderate obstructive disease, recommending medical management for Takotsubo cardiomyopathy. LVEDP 19. Multiple episodes of NSVT on 4/15, not responding to metoprolol, started on IV amiodarone.  Current HF Medications: Beta Blocker: carvedilol 6.25 mg BID ACE/ARB/ARNI: irbesartan 37.5 mg daily starting 4/18 SGLT2i: Jardiance 25 mg daily  Prior to admission HF Medications: Diuretic: furosemide 20 mg daily ACE/ARB/ARNI: losartan 25 mg daily SGLT2i: Jardiance 25 mg daily  Pertinent Lab Values: Serum creatinine 1.13, BUN 19, Potassium 4.9, Sodium 134, Magnesium 2.4  Vital Signs: Weight: 140 lbs (admission weight: 142 lbs) Blood pressure: 100-130/50s  Heart rate: 60s  I/O: net +1.2L  Medication Assistance / Insurance Benefits Check: Does the patient have prescription insurance?  Yes Type of insurance plan: [place] Medicaid  Outpatient Pharmacy:  Prior to admission outpatient [facility] Is the patient willing to use [facility] TOC pharmacy at discharge? Yes Is the patient willing to transition their outpatient pharmacy to utilize a [facility] outpatient pharmacy?   Pending    Assessment: 1. Acute systolic CHF (LVEF 30-35%), due to Takotsubo cardiomyopathy. NYHA class II symptoms. - Euvolemic on exam, may need PRN lasix for discharge. Keep K>4 and Mg>2 - Continue carvedilol 6.25 mg BID - Holding Entresto  24/26 mg BID with bump in SCr and elevated K. Cards starting ARB tomorrow. Studies have shown that the hyperkalemia and creatinine elevation are similar when comparing ARB vs ARNI. Agree with holding today, but consider restarting Entresto tomorrow.  - Consider starting spironolactone prior to discharge vs at follow up pending creatinine and K - Continue Jardiance 25 mg daily  Plan: 1) Medication changes recommended at this time: - Agree with changes today, consider resuming Entresto in AM if renal function and K improved  2) Patient assistance: - Entresto copay $0  3)  Education  - Patient has been educated on current HF medications and potential additions to HF medication regimen - Patient verbalizes understanding that over the next few months, these medication doses may change and more medications may be added to optimize HF regimen - Patient has been educated on basic disease state pathophysiology and goals of therapy   Sharen Hones, PharmD, BCPS Heart Failure Stewardship Pharmacist Phone [phone]

## 2022-11-23 NOTE — Plan of Care (Signed)
  Problem: Education: Goal: Knowledge of General Education information will improve Description: Including pain rating scale, medication(s)/side effects and non-pharmacologic comfort measures Outcome: Progressing   Problem: Health Behavior/Discharge Planning: Goal: Ability to manage health-related needs will improve Outcome: Progressing   Problem: Clinical Measurements: Goal: Ability to maintain clinical measurements within normal limits will improve Outcome: Progressing Goal: Cardiovascular complication will be avoided Outcome: Progressing   Problem: Activity: Goal: Risk for activity intolerance will decrease Outcome: Progressing   Problem: Coping: Goal: Level of anxiety will decrease Outcome: Progressing   Problem: Clinical Measurements: Goal: Cardiovascular complication will be avoided Outcome: Progressing

## 2022-11-24 DIAGNOSIS — N179 Acute kidney failure, unspecified: Secondary | ICD-10-CM | POA: Diagnosis not present

## 2022-11-24 LAB — BASIC METABOLIC PANEL
Anion gap: 14 (ref 5–15)
BUN: 22 mg/dL (ref 8–23)
CO2: 16 mmol/L — ABNORMAL LOW (ref 22–32)
Calcium: 8.2 mg/dL — ABNORMAL LOW (ref 8.9–10.3)
Chloride: 105 mmol/L (ref 98–111)
Creatinine, Ser: 1.2 mg/dL — ABNORMAL HIGH (ref 0.44–1.00)
GFR, Estimated: 47 mL/min — ABNORMAL LOW (ref >60)
Glucose, Bld: 150 mg/dL — ABNORMAL HIGH (ref 70–99)
Potassium: 3.8 mmol/L (ref 3.5–5.1)
Sodium: 135 mmol/L (ref 135–145)

## 2022-11-24 LAB — LIPID PANEL
Cholesterol: 90 mg/dL (ref 0–200)
HDL: 23 mg/dL — ABNORMAL LOW (ref >40)
LDL Cholesterol: 41 mg/dL (ref 0–99)
Total CHOL/HDL Ratio: 3.9 RATIO
Triglycerides: 129 mg/dL (ref <150)
VLDL: 26 mg/dL (ref 0–40)

## 2022-11-24 LAB — GLUCOSE, CAPILLARY
Glucose-Capillary: 137 mg/dL — ABNORMAL HIGH (ref 70–99)
Glucose-Capillary: 252 mg/dL — ABNORMAL HIGH (ref 70–99)
Glucose-Capillary: 257 mg/dL — ABNORMAL HIGH (ref 70–99)

## 2022-11-24 MED ORDER — SIMETHICONE 80 MG PO CHEW
80.0000 mg | CHEWABLE_TABLET | Freq: Once | ORAL | Status: AC
  Administered 2022-11-24: 80 mg via ORAL
  Filled 2022-11-24: qty 1

## 2022-11-24 MED ORDER — ENSURE ENLIVE PO LIQD
237.0000 mL | Freq: Two times a day (BID) | ORAL | Status: DC
  Administered 2022-11-24 – 2022-11-25 (×4): 237 mL via ORAL

## 2022-11-24 MED ORDER — ALUM & MAG HYDROXIDE-SIMETH 200-200-20 MG/5ML PO SUSP
30.0000 mL | ORAL | Status: DC | PRN
  Administered 2022-11-24 – 2022-11-25 (×6): 30 mL via ORAL
  Filled 2022-11-24 (×6): qty 30

## 2022-11-24 MED ORDER — SODIUM CHLORIDE 0.9 % IV BOLUS
250.0000 mL | Freq: Once | INTRAVENOUS | Status: AC
  Administered 2022-11-24: 250 mL via INTRAVENOUS

## 2022-11-24 MED ORDER — LEVOTHYROXINE SODIUM 88 MCG PO TABS
88.0000 ug | ORAL_TABLET | Freq: Every day | ORAL | Status: DC
  Administered 2022-11-25: 88 ug via ORAL
  Filled 2022-11-24: qty 1

## 2022-11-24 MED ORDER — AMIODARONE HCL 200 MG PO TABS: 200.0000 mg | ORAL_TABLET | Freq: Every day | ORAL | Status: DC

## 2022-11-24 NOTE — TOC Transition Note (Signed)
Transition of Care [facility]) - CM/SW Discharge Note   Patient Details  Name: Mercedes Barr MRN: [id number] Date of Birth: 12-Oct-1945  Transition of Care [facility]) CM/SW Contact:  Leone Haven, RN Phone Number: 11/24/2022, 10:06 AM   Clinical Narrative:    For possible dc today, she is set up with [facility].  Daughter and SIL will transport home at dc.    Final next level of care: Home w Home Health Services Barriers to Discharge: No Barriers Identified   Patient Goals and CMS Choice CMS Medicare.gov Compare Post Acute Care list provided to:: Patient Choice offered to / list presented to : Patient  Discharge Placement                         Discharge Plan and Services Additional resources added to the After Visit Summary for   In-house Referral: NA Discharge Planning Services: CM Consult Post Acute Care Choice: Home Health          DME Arranged: N/A DME Agency: NA       HH Arranged: PT, OT HH Agency: [facility] Date HH Agency Contacted: 11/24/22 Time HH Agency Contacted: 1004 Representative spoke with at [facility] Agency: Tresa Endo  Social Determinants of Health (SDOH) Interventions SDOH Screenings   Food Insecurity: No Food Insecurity (11/18/2022)  Housing: Low Risk  (11/18/2022)  Transportation Needs: No Transportation Needs (11/18/2022)  Utilities: Not At Risk (11/18/2022)  Alcohol Screen: Low Risk  (09/27/2022)  Depression (PHQ2-9): Medium Risk (11/02/2022)  Financial Resource Strain: Low Risk  (09/27/2022)  Physical Activity: Insufficiently Active (09/27/2022)  Social Connections: Moderately Isolated (09/27/2022)  Stress: No Stress Concern Present (09/27/2022)  Tobacco Use: Low Risk  (11/21/2022)     Readmission Risk Interventions     No data to display

## 2022-11-24 NOTE — Progress Notes (Signed)
Occupational Therapy Treatment Patient Details Name: Mercedes Barr MRN: [id number] DOB: May 12, 1946 Today's Date: 11/24/2022   History of present illness 77 y.o. female who presented on 4/12 with abdominal pain, s/p lap chole on 4/13, s/p L Cardiac Cath 11/20/22. PMH of atrioventricular cushion defect repair age 52 complicated by CHB s/p pacemaker with revision of system in 1999 with last gen change 2018 ([employer]), longstanding persistent afib/flutter on Xarelto, arthritis, asthma, COPD, chronic HFpEF, DM, HTN, hypothyroidism, GERD   OT comments  Pt was seen for OT ADL retraining session with focus on bed mobility (Mod I level), functional mobility with Min guard assist using RW or +1 HHA for transfer to sink. Sitting at sink level to complete grooming & upper body bathe/dress with set-up assist and lower body bathe/dress with min guard assist. Pt benefits from frequent rest breaks. Discussed energy conservation techniques and issued/reviewed handout with pt. Pt is hopeful to d/c home later today and was left sitting up in bed eating applesauce and jello. NT was notified that pt was requesting Ensure.   Recommendations for follow up therapy are one component of a multi-disciplinary discharge planning process, led by the attending physician.  Recommendations may be updated based on patient status, additional functional criteria and insurance authorization.    Assistance Recommended at Discharge Intermittent Supervision/Assistance  Patient can return home with the following  A little help with walking and/or transfers;Assist for transportation;Assistance with cooking/housework;Help with stairs or ramp for entrance   Equipment Recommendations  None recommended by OT (pt has necessary DME)    Recommendations for Other Services      Precautions / Restrictions Precautions Precautions: Fall;Other (comment) Precaution Comments: L cardiac cath, watch BP Restrictions Weight Bearing Restrictions: No        Mobility Bed Mobility Overal bed mobility: Modified Independent Bed Mobility: Sit to Supine, Supine to Sit     Supine to sit: Modified independent (Device/Increase time), HOB elevated Sit to supine: Modified independent (Device/Increase time), HOB elevated   Transfers Overall transfer level: Needs assistance Equipment used: Rolling walker (2 wheels), 1 person hand held assist Transfers: Sit to/from Stand, Bed to chair/wheelchair/BSC Sit to Stand: Min guard     Step pivot transfers: Supervision, Min guard     Balance Overall balance assessment: Mild deficits observed, not formally tested Sitting-balance support: Feet supported, Single extremity supported, No upper extremity supported Sitting balance-Leahy Scale: Good     Standing balance support: During functional activity Standing balance-Leahy Scale: Fair     ADL either performed or assessed with clinical judgement   ADL Overall ADL's : Needs assistance/impaired Eating/Feeding: Modified independent;Sitting;Bed level   Grooming: Wash/dry hands;Wash/dry face;Oral care;Set up;Sitting Grooming Details (indicate cue type and reason): Sitting at sink to complete grooming tasks today Upper Body Bathing: Set up;Sitting;Min guard Upper Body Bathing Details (indicate cue type and reason): Sitting at sink (min A to wash back) Lower Body Bathing: Supervison/ safety;Min guard;Set up;Sitting/lateral leans;Sit to/from stand   Upper Body Dressing : Set up;Sitting Upper Body Dressing Details (indicate cue type and reason): Donning clean gown sitting at sink level Lower Body Dressing: Set up;Min guard;Sitting/lateral leans;Sit to/from stand Lower Body Dressing Details (indicate cue type and reason): Don/doff socks during ADL's while sitting at sink Toilet Transfer: Ambulation;BSC/3in1;Rolling walker (2 wheels);Supervision/safety Toilet Transfer Details (indicate cue type and reason): Simulated transfer from bed to chair at sink  level Toileting- Clothing Manipulation and Hygiene: Min guard;Sitting/lateral lean   Functional mobility during ADLs: Min guard;Rolling walker (2 wheels) General ADL  Comments: Pt was seen for OT ADL retraining session with focus on bed mobility (Mod I level), functional mobility with Min guard assist using RW for transfer to sink. Sitting at sink level to complete grooming with set-up assist and lower body bathe/dress with min guard assist. Pt benefits from frequent rest breaks. Discussed energy conservation techniques and issued/reviewed handout with pt. Pt is hopeful to d/c home later today and was left sitting up in bed eating applesauce and jello. NT was notified that pt was requesting Ensure.    Extremity/Trunk Assessment Upper Extremity Assessment Upper Extremity Assessment: Generalized weakness;Defer to OT evaluation   Lower Extremity Assessment Lower Extremity Assessment: Defer to PT evaluation   Cervical / Trunk Assessment Cervical / Trunk Assessment: Normal    Vision Ability to See in Adequate Light: 0 Adequate Patient Visual Report: No change from baseline Vision Assessment?: No apparent visual deficits          Cognition Arousal/Alertness: Awake/alert Behavior During Therapy: WFL for tasks assessed/performed Overall Cognitive Status: Within Functional Limits for tasks assessed   General Comments: Pt reports generalized fatigue as limiting factor              General Comments VSS on RA    Pertinent Vitals/ Pain       Pain Assessment Pain Assessment: No/denies pain (reports nausea and fatigue) Pain Score: 0-No pain  Home Living Family/patient expects to be discharged to:: Private residence Living Arrangements: Children Available Help at Discharge: Family;Available 24 hours/day (Per pt someone is always home) Type of Home: House Home Access: Ramped entrance     Home Layout: One level     Bathroom Shower/Tub: Chief Strategy Officer: Handicapped  height     Home Equipment: Agricultural consultant (2 wheels);Cane - single point;BSC/3in1;Grab bars - tub/shower;Grab bars - toilet;Shower seat;Hand held shower head      Prior Functioning/Environment   Lives with daughter and son in Social [place]. Please refer to initial OT assessment for prior level of function   Frequency  Min 2X/week        Progress Toward Goals  OT Goals(current goals can now be found in the care plan section)  Progress towards OT goals: Progressing toward goals  Acute Rehab OT Goals Patient Stated Goal: Go home later today OT Goal Formulation: With patient Time For Goal Achievement: 12/05/22 Potential to Achieve Goals: Fair  Plan Discharge plan remains appropriate       AM-PAC OT "6 Clicks" Daily Activity     Outcome Measure   Help from another person eating meals?: None Help from another person taking care of personal grooming?: A Little Help from another person toileting, which includes using toliet, bedpan, or urinal?: A Little Help from another person bathing (including washing, rinsing, drying)?: A Little Help from another person to put on and taking off regular upper body clothing?: None Help from another person to put on and taking off regular lower body clothing?: A Little 6 Click Score: 20    End of Session Equipment Utilized During Treatment: Rolling walker (2 wheels);Other (comment) (+1 HHA)  OT Visit Diagnosis: Unsteadiness on feet (R26.81);Other abnormalities of gait and mobility (R26.89)   Activity Tolerance Patient tolerated treatment well   Patient Left in bed;with call bell/phone within reach   Nurse Communication Mobility status;Other (comment) (ADL completed and pt asking for Ensure)        Time: 1610-9604 OT Time Calculation (min): 29 min  Charges: OT General Charges $OT Visit: 1 Visit OT  Treatments $Self Care/Home Management : 23-37 mins  Alm Bustard, OTR/L 11/24/2022, 8:59 AM

## 2022-11-24 NOTE — Assessment & Plan Note (Addendum)
Cr 1.2 this morning from 0.87 48 hours ago. Most likely due to hypovolemia. Patient has not been taking in that much po due to nausea previously. Yesterday, entresto was held to not exacerbate AKI.  - Consider giving 250 cc bolus.  - Continue to hold entresto  - AM BMP

## 2022-11-24 NOTE — Progress Notes (Signed)
4 Days Post-Op   Subjective/Chief Complaint: Feeling much better this am. Only one episode of mild nausea and no emesis. Abdominal pain controlled with tylenol and improving  Objective: Vital signs in last 24 hours: Temp:  [97.5 F (36.4 C)-97.7 F (36.5 C)] 97.7 F (36.5 C) (04/18 0749) Pulse Rate:  [59-88] 61 (04/18 0750) Resp:  [16-18] 18 (04/18 0749) BP: (102-144)/(49-64) 140/53 (04/18 0749) SpO2:  [97 %-99 %] 99 % (04/18 0750) Last BM Date : 11/22/22  Intake/Output from previous day: 04/17 0701 - 04/18 0700 In: 1158.2 [P.O.:900; I.V.:258.2] Out: 1900 [Urine:1900] Intake/Output this shift: No intake/output data recorded.  PE: General: pleasant, WD, female who is laying in bed in NAD Lungs: Respiratory effort nonlabored on room air Abd: soft, ND, mild TTP over incisions. Incisions cdi with surgical glue MSK: all 4 extremities are symmetrical with no cyanosis, clubbing, or edema. Skin: warm and dry Psych: A&Ox3 with an appropriate affect.     Lab Results:  Recent Labs    11/22/22 0618  WBC 9.4  HGB 15.2*  HCT 48.7*  PLT 247    BMET Recent Labs    11/23/22 0041 11/24/22 0044  NA 134* 135  K 4.9 3.8  CL 103 105  CO2 15* 16*  GLUCOSE 159* 150*  BUN 19 22  CREATININE 1.13* 1.20*  CALCIUM 8.2* 8.2*       Latest Ref Rng & Units 11/23/2022    9:27 AM 11/21/2022    3:46 PM 11/21/2022    1:12 AM  Hepatic Function  Total Protein 6.5 - 8.1 g/dL 6.2  6.5  6.5   Albumin 3.5 - 5.0 g/dL 2.6  2.9  2.8   AST 15 - 41 U/L ALT 0 - 44 U/L Alk Phosphatase 38 - 126 U/L 55  52  48   Total Bilirubin 0.3 - 1.2 mg/dL 1.6  1.1  0.7   Bilirubin, Direct 0.0 - 0.2 mg/dL 0.1        Studies/Results: No results found.  Anti-infectives: Anti-infectives (From admission, onward)    Start     Dose/Rate Route Frequency Ordered Stop   11/18/22 1000  cefTRIAXone (ROCEPHIN) 2 g in sodium chloride 0.9 % 100 mL IVPB  Status:  Discontinued        2  g 200 mL/hr over 30 Minutes Intravenous Every 24 hours 11/18/22 0834 11/20/22 1610       Assessment/Plan: Acute cholecystitis POD5 s/p laparoscopic lysis of adhesions/ lap chole 11/19/22 - Dr. Cliffton Asters   - CP and EKG changes POD1. Cardiology following and [facility] 4/14. Findings of takotsubo cardiomyopathy. Resumed xarelto 4/15. Episodes of vtach with adjustments to BB and addition of amiodarone per cards - pain and nausea improved. Having bowel function. Tolerating soft diet - WBC and LFTs WNL last check - encouraged IS and mobilization - pain overall well controlled with tylenol. Consider addition of hydrocodone-acetaminophen if worsening pain and continue scheduled tylenol for now. PRN robaxin added  Follow up scheduled and post op/care instructions discussed. She does not need prescription pain medication. General surgery will remain available as needed. Please reach out with any questions or concerns  FEN: soft, IVF per primary ID: rocephin periop VTE: xarelto 4/15>  - per medicine -  Takotsubo cardiomyopathy  COPD CAD Complete heart block s/p PPM A. Fib on xarelto - last dose 4/11 AM. held HTN Hypothyroidism T2DM GERD   LOS: 4 days  Eric Form, [facility] Surgery 11/24/2022, 10:04 AM Please see Amion for pager number during day hours 7:00am-4:30pm

## 2022-11-24 NOTE — Hospital Course (Addendum)
Mercedes Barr is a 77 y.o.female with a history of complete heart block with ICD, A-fib on Xarelto, history of AV cushion repair in 1988 hypothyroidism, who was admitted to the [facility] at [facility] for abdominal pain. Her hospital course is detailed below:  Acute cholecystitis Patient presented with right-sided abdominal pain that radiated to her back.  Right upper quadrant ultrasound on 4/12 was significant for cholelithiasis and equivocal for cholecystitis there was right upper quadrant tenderness with borderline gallbladder wall thickening but no pericholecystic edema.  CT abdomen pelvis with contrast had similar findings.  General surgery was consulted and recommended laparoscopic cholecystectomy.  Xarelto was held and cardiology was consulted prior to surgery.  Patient had laparoscopic cholecystectomy without complication on 4/13.  She continued to have soreness at the surgical site but otherwise improved and remained stable in terms of abdominal and gallbladder standpoint.  She will follow-up with general surgery outpatient.    Takotsubo cardiomyopathy On 4/13 after surgery patient complained of sternal chest pain.  She noted to have multiple runs of V. tach postoperatively.  EKG showed wide inverted T waves though troponins had actually decreased from previous day.  Cardiology was consulted and repeat EKG worsened.  Aspirin 325 was started and echo was ordered.  Pain worsened on 4/14 cardiology was called to the bedside and bedside TTE showed mid to apical septal and anterior WMA representing a stress cardiomyopathy.  Patient was taken to cath on 4/14.  Left heart cath had evidence of 80% stenosis in the first diagonal otherwise 40% in the LAD.  Formal echo after cath showed evidence of Takotsubo cardiomyopathy.  Cardiology started GDMT with Entresto, Crestor, carvedilol and heart failure team started to follow.  Patient was monitored with cardiac telemetry.  Patient continued to have nausea  as a persistent symptom and was unable to advance diet.  Xarelto was restarted after being approved by general surgery.  On 4/15 patient started to have multiple runs of V. tach on the telemetry monitor.  EKG appeared worsened with QT prolongation and worsening peaked T waves.  Patient was given magnesium sulfate and a one-time dose of Lopressor.  QT prolonging medications were stopped or transition to alternatives.  Patient was started on amiodarone drip for ventricular ectopy.  Ventricular ectopy improved. On 4/16 patient was transitioned to p.o. amiodarone.  Nausea at this time improved and patient advanced diet.  Nausea thought to be most likely due to patient being in V. tach.  TSH and T4 came back to be elevated.  Thus, cardiology recommended discontinuing amiodarone on 4/18.  Patient remained stable without any runs of V. tach overnight on telemetry.  Thus patient will be discharged on home losartan 25 mg, carvedilol 6.25 mg twice daily, Crestor 20 mg, and Lasix 40 mg as needed.  She will be followed by cardiology outpatient.  AKI Patient had creatinine of 1.20 on 4/18 which was above her baseline of approximately 0.8.  [name] was held and patient was thought to most likely have AKI from decreased blood pressures and setting of starting Entresto as well as hypovolemia in the setting of nausea and decreased p.o. intake status post laparoscopic cholecystectomy and nausea secondary to runs of V. tach.  Patient was encouraged to increase p.o. and after additional fluid bolus of 250 cc AKI resolved on 4/19.  Home losartan was restarted and restarting [name] will be revisited at outpatient cardiology appointment.  Hypothyroidism Patient was initially continued on home Synthroid of 112 mcg however TSH and  T4 returned elevated.  This was after patient had been started on amiodarone for ventricular ectopy.  However also thought to be due to to upper therapeutic dose of Synthroid.  Patient has not had TSH and  T4 checked in some years.  Synthroid was reduced to dose of 88 mcg.  Patient will need recheck of TSH and T4 in approximately 2 to 4 weeks.  Essential tremor Patient had been on propranolol for essential tremor however after diagnosis of Takotsubo already a myopathy cardiology was consulted and switched patient to carvedilol 6.25 mg twice daily as this would also help with her cardiomyopathy.  Other chronic conditions were medically managed with home medications and formulary alternatives as necessary (depression, A-fib)  PCP Follow-up Recommendations: Repeat ECHO in 3 months  Decreased levothyroxine dose, follow up TSH and T4 in 2-4 weeks  Repeat Cr after starting back losartan, consider restart spiro/entresto at outpatient cardiology appointment. Ensure follow up with cardiology and general surgery

## 2022-11-24 NOTE — Progress Notes (Addendum)
Daily Progress Note Intern Pager: [phone]  Patient name: Mercedes Barr Medical record number: [id number] Date of birth: 29-Jan-1946 Age: 77 y.o. Gender: female  Primary Care Provider: Evelena Leyden, DO Consultants: Cardiology, Heart Failure, General Surgery  Code Status: Full   Pt Overview and Major Events to Date:  4/12 - Admitted  4/13 - Lap chole with General Surgery  4/14 - Chest pain with EKG changes,  LHC 1st Diag lesion is 80% stenosed, ECHO consistent w/ takotsobu  4/15 - Code status changed to Full Code per patient, started on amiodarone gtt for ectopy    Assessment and Plan: Mercedes Barr is a 77 y.o. female  p/w epigastric and RUQ pain found to have acute cholecystitis, now s/p lap chole (4/13). Hospital course complicated by chest pain found to have takotsobu cardiomyopathy and ventricular ectopy, now improved.   * Takotsubo cardiomyopathy LHC from 4/14 with evidence of 80% stenosis in 1st diagonal. 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  - Cardiology following, continuing GDMT   - crestor   - carvedilol  - Heart failure following  - Crestor  daily - Continue telemetry  - Hold entresto and lasix for now, blood pressure has improved to be hypertensive; however, patient now has AKI.   Ventricular ectopy No ectopy on telemetry. Patient is tolerating po amiodarone.  - Cardiology following, appreciate recommendations  - Continue po amiodarone, per cardiology  - Continue carvedilol  - Continue cardiac telemetry   AKI (acute kidney injury) Cr 1.2 this morning from 0.87 48 hours ago. Most likely due to hypovolemia. Patient has not been taking in that much po due to nausea previously. Yesterday, entresto was held to not exacerbate AKI.  - Consider giving 250 cc bolus.  - Continue to hold entresto  - AM BMP   Hypothyroidism TSH 0.108. T4 1.72. Most likely patient is on too high of a dose of synthroid.  - Decrease synthroid to 88 mcg.  -  F/u TSH and T4 outpatient    Afib - Continue xarelto  Essential tremor- carvedilol 6.25 mg  Depression- continue fluoxetine  daily  FEN/GI: Regular diet  PPx: home xarelto  Dispo: Home pending clinical improvement   Subjective:  Patient denies nausea or chest pain. She says she was able to eat a burger yesterday, but was not able to fully chew the meat (she left her top dentures at home) and thus, had some gas overnight.   Objective: Temp:  [97.5 F (36.4 C)-97.7 F (36.5 C)] 97.7 F (36.5 C) (04/18 0749) Pulse Rate:  [59-88] 61 (04/18 0750) Resp:  [16-18] 18 (04/18 0749) BP: (102-144)/(49-64) 140/53 (04/18 0749) SpO2:  [97 %-99 %] 99 % (04/18 0750) Physical Exam: General: Chronically ill appearing, more comfortable  Cardiovascular: RRR, II/VI systolic murmur, cap refill <2 seconds Respiratory: Normal work of breathing on room air  Abdomen: Soft, tender to palpation over RLQ, non distended Extremities: No BLE edema   Laboratory: Most recent CBC Lab Results  Component Value Date   WBC 9.4 11/22/2022   HGB 15.2 (H) 11/22/2022   HCT 48.7 (H) 11/22/2022   MCV 86.7 11/22/2022   PLT 247 11/22/2022   Most recent BMP    Latest Ref Rng & Units 11/24/2022   12:44 AM  BMP  Glucose 70 - 99 mg/dL 147   BUN 8 - 23 mg/dL 22   Creatinine 8.29 - 1.00 mg/dL 5.62   Sodium 130 - 865 mmol/L 135  Potassium 3.5 - 5.1 mmol/L 3.8   Chloride 98 - 111 mmol/L 105   CO2 22 - 32 mmol/L 16   Calcium 8.9 - 10.3 mg/dL 8.2    TSH 1.610, T4 9.60  Mercedes Mola, MD 11/24/2022, 9:39 AM  PGY-1, [facility] FPTS Intern pager: [phone], text pages welcome Secure chat group [facility] Teaching Service

## 2022-11-24 NOTE — Progress Notes (Signed)
   Heart Failure Stewardship Pharmacist Progress Note   PCP: Evelena Leyden, DO PCP-Cardiologist: Thurmon Fair, MD    HPI:  77 yo F with PMH of afib, CHB s/p PPM, asthma, CHF, COPD, GERD, T2DM, and HTN.   Presented to the ED on 4/12 with chest pain and abdominal pain. Ultrasound showed gallstones, surgery consulted. Planed cholecystectomy for acute cholecystitis, done on 4/13. Cardiology also consulted with rising troponin. Developed worsening chest pain with EKG changes on 4/14. ECHO showed EF 30-35% with regional wall motion abnormalities. Taken to cath lab and found to have mild to moderate obstructive disease, recommending medical management for Takotsubo cardiomyopathy. LVEDP 19. Multiple episodes of NSVT on 4/15, not responding to metoprolol, started on IV amiodarone.  Current HF Medications: Beta Blocker: carvedilol 6.25 mg BID SGLT2i: Jardiance 25 mg daily  Prior to admission HF Medications: Diuretic: furosemide 20 mg daily ACE/ARB/ARNI: losartan 25 mg daily SGLT2i: Jardiance 25 mg daily  Pertinent Lab Values: Serum creatinine 1.20, BUN 22, Potassium 3.8, Sodium 135, Magnesium 2.4  Vital Signs: Weight: 140 lbs (admission weight: 142 lbs) Blood pressure: 110-140/50s  Heart rate: 60s  I/O: net +0.9L  Medication Assistance / Insurance Benefits Check: Does the patient have prescription insurance?  Yes Type of insurance plan: [place] [employer]  Outpatient Pharmacy:  Prior to admission outpatient [facility] Is the patient willing to use [facility] pharmacy at discharge? Yes Is the patient willing to transition their outpatient pharmacy to utilize a [facility] outpatient pharmacy?   Yes    Assessment: 1. Acute systolic CHF (LVEF 30-35%), due to Takotsubo cardiomyopathy. NYHA class II symptoms. - Euvolemic on exam, may need PRN lasix for discharge. Keep K>4 and Mg>2 - Continue carvedilol 6.25 mg BID - Held Entresto 24/26 mg BID yesterday with bump in SCr and hypotension. BP  improved but creatinine still up. Agree with holding again today. - Consider starting spironolactone prior to discharge vs at follow up pending creatinine improvement - Continue Jardiance 25 mg daily  Plan: 1) Medication changes recommended at this time: - Continue current regimen  2) Patient assistance: - Entresto copay $0  3)  Education  - Patient has been educated on current HF medications and potential additions to HF medication regimen - Patient verbalizes understanding that over the next few months, these medication doses may change and more medications may be added to optimize HF regimen - Patient has been educated on basic disease state pathophysiology and goals of therapy   Sharen Hones, PharmD, BCPS Heart Failure Stewardship Pharmacist Phone [phone]

## 2022-11-24 NOTE — Progress Notes (Addendum)
Rounding Note    Patient Name: Mercedes Barr Date of Encounter: 11/24/2022  [facility] Cardiologist: Thurmon Fair, MD   Subjective   Patient is nauseous today after starting amiodarone PO yesterday. Denies any chest pain, heart palpitations, shortness of breath.  No runs of V. tach noted on telemetry.  Inpatient Medications    Scheduled Meds:  acetaminophen  650 mg Oral Q6H   amiodarone  200 mg Oral Daily   carvedilol  6.25 mg Oral BID WC   empagliflozin  25 mg Oral Daily   FLUoxetine  10 mg Oral Daily   fluticasone furoate-vilanterol  1 puff Inhalation Daily   insulin aspart  0-5 Units Subcutaneous QHS   insulin aspart  0-9 Units Subcutaneous TID WC   irbesartan  37.5 mg Oral Daily   latanoprost  1 drop Both Eyes QHS   levothyroxine  112 mcg Oral Q0600   rivaroxaban  20 mg Oral Q supper   rosuvastatin  20 mg Oral Daily   sodium chloride flush  3 mL Intravenous Q12H   Continuous Infusions:  sodium chloride     PRN Meds: sodium chloride, alum & mag hydroxide-simeth, HYDROmorphone (DILAUDID) injection, methocarbamol, nitroGLYCERIN, polyethylene glycol, povidone-iodine, prochlorperazine, sodium chloride flush   Vital Signs    Vitals:   11/24/22 0044 11/24/22 0500 11/24/22 0749 11/24/22 0750  BP: (!) 133/53 (!) 144/64 (!) 140/53   Pulse: (!) 59 69 63 61  Resp: 18 16 18    Temp: (!) 97.5 F (36.4 C) 97.7 F (36.5 C) 97.7 F (36.5 C)   TempSrc: Oral Oral    SpO2: 98% 98% 98% 99%  Weight:      Height:        Intake/Output Summary (Last 24 hours) at 11/24/2022 0757 Last data filed at 11/24/2022 0500 Gross per 24 hour  Intake 1158.21 ml  Output 1900 ml  Net -741.79 ml       11/22/2022    4:07 AM 11/21/2022    4:19 AM 11/20/2022    5:38 AM  Last 3 Weights  Weight (lbs) 140 lb 1.6 oz 143 lb 4.8 oz 145 lb 1 oz  Weight (kg) 63.549 kg 65 kg 65.8 kg      Telemetry    Ventricularly paced without any signs of V. tach.  Heart rate 65.- Personally  Reviewed  ECG    No new tracings.- Personally Reviewed  Physical Exam   GEN: No acute distress.   Neck: No JVD Cardiac: RRR,  very soft systolic murmur  Respiratory: Clear to auscultation bilaterally. GI: Soft, nontender, non-distended  MS: No edema; No deformity. Neuro:  Nonfocal  Psych: Normal affect   Labs    High Sensitivity Troponin:   Recent Labs  Lab 11/19/22 1755 11/19/22 2008 11/19/22 2138 11/20/22 0824 11/20/22 1105  TROPONINIHS 68* 58* 56* 43* 91*      Chemistry Recent Labs  Lab 11/21/22 0112 11/21/22 1546 11/22/22 0058 11/23/22 0041 11/23/22 0927 11/24/22 0044  NA 139 136 137 134*  --  135  K 4.1 3.9 3.8 4.9  --  3.8  CL 102 101 102 103  --  105  CO2 26 22 22  15*  --  16*  GLUCOSE 148* 127* 164* 159*  --  150*  BUN 17 16 13 19   --  22  CREATININE 0.97 0.82 0.87 1.13*  --  1.20*  CALCIUM 8.5* 8.2* 8.5* 8.2*  --  8.2*  MG  --  2.7* 2.4 2.4  --   --  PROT 6.5 6.5  --   --  6.2*  --   ALBUMIN 2.8* 2.9*  --   --  2.6*  --   AST 28 24  --   --  16  --   ALT 30 25  --   --  19  --   ALKPHOS 48 52  --   --  55  --   BILITOT 0.7 1.1  --   --  1.6*  --   GFRNONAA >60 >60 >60 51*  --  47*  ANIONGAP 11 13 13  16*  --  14     Lipids No results for input(s): "CHOL", "TRIG", "HDL", "LABVLDL", "LDLCALC", "CHOLHDL" in the last 168 hours.  Hematology Recent Labs  Lab 11/20/22 0112 11/21/22 0112 11/22/22 0618  WBC 9.8 7.5 9.4  RBC 4.84 4.69 5.62*  HGB 13.0 12.6 15.2*  HCT 42.5 41.5 48.7*  MCV 87.8 88.5 86.7  MCH 26.9 26.9 27.0  MCHC 30.6 30.4 31.2  RDW 13.7 13.6 13.2  PLT 239 203 247    Thyroid  Recent Labs  Lab 11/23/22 0927  TSH 0.108*  FREET4 1.72*    BNPNo results for input(s): "BNP", "PROBNP" in the last 168 hours.  DDimer No results for input(s): "DDIMER" in the last 168 hours.   Radiology    No results found.  Cardiac Studies   Left heart catheterization 11/20/2022  Prox RCA lesion is 30% stenosed.   Prox LAD lesion is 40%  stenosed.   1st Diag lesion is 80% stenosed.   1.  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 2.  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   Echocardiogram 11/20/2022 Comparison(s): Changes from prior study are noted. LVEF is worse now,  30-35% with RWMA.   FINDINGS   Left Ventricle: Left ventricular ejection fraction, by estimation, is 30  to 35%. The left ventricle has moderately decreased function. The left  ventricle demonstrates regional wall motion abnormalities. The left  ventricular internal cavity size was  normal in size. There is no left ventricular hypertrophy. Left ventricular  diastolic function could not be evaluated.     LV Wall Scoring:  The mid anteroseptal segment, mid anterolateral segment, mid inferoseptal  segment, and mid anterior segment are akinetic. The posterior wall, basal  anteroseptal segment, and mid inferior segment are hypokinetic.   Right Ventricle: The right ventricular size is not well visualized. Right  vetricular wall thickness was not assessed. Right ventricular systolic  function was not well visualized.   Left Atrium: Left atrial size was not assessed.   Right Atrium: Right atrial size was not assessed.   Pericardium: There is no evidence of pericardial effusion.   Mitral Valve: The mitral valve was not assessed.   Tricuspid Valve: The tricuspid valve is not assessed.   Aortic Valve: The aortic valve was not assessed.   Pulmonic Valve: The pulmonic valve was not assessed.   Aorta: Aortic root could not be assessed.   Venous: The inferior vena cava was not well visualized.   IAS/Shunts: The interatrial septum was not assessed. 34.  Patient Profile     77 y.o. female with a hx of atrioventricular cushion defect repair age 70 complicated by CHB  s/p pacemaker with revision of system in 1999 with last gen change 2018 ([employer]tr[employer]), longstanding persistent afib/flutter on Xarelto, arthritis, asthma,  COPD, chronic HFpEF, DM, HTN, hypothyroidism, GERD who was admitted 11/18/2022 and now has underwent lap chole and heart catheterization for acute onset of chest pain   Assessment & Plan   Acute onset of chest pain with wall motion abnormalities on TTE in EKG inversions Takotsubo cardiomyopathy Hypertension Patient had abrupt onset of chest pain with T wave inversions in precordial leads.  Troponins elevated at 43 and 91.  She was emergently taken into the Cath Lab that showed no signs of an occlusion.  She had mild to moderate obstructive disease and a new reduced EF of 30 to 35% with wall motion abnormalities consistent with Takotsubo cardiomyopathy.  Prior echocardiogram in 2022 was 55 to 60%.  Patient not in any chest pain now. Will plan to optimize GDMT.   Yesterday we stopped patient's Entresto due  to worsening renal function and hypotension.  Now patient's blood pressure has been around 140s and has slight decrease in renal function compared to yesterday. Continue home Jardiance 25 mg daily Holding Entresto 24-26mg  due to mild AKI - given further rise in Cr, will hold off ARB as well Continue Carvedilol 6.25 BID Will need repeat echocardiogram in about 3 months, would arrange in follow-up Euvolemic on exam, discussed low sodium diet, and monitoring of fluids. Can send home on lasix  PRN for edema if needed.    Nonsustained ventricular tachycardia  Ventricular ectopy Patient had new onset of ventricular tachycardia/ectopy on 11/21/2022.  She was evaluated at bedside on 2 different occasions in which we started a one-time dose of Lopressor 25 mg and then subsequently added on amiodarone infusion due to another episode and worsening symptoms of chest pain and shortness of breath.  During these episodes she is hemodynamically stable.  She has  not had any episodes of ventricular tachycardia on telemetry since starting the amio and HR well controlled now.  Patient feeling much better on amiodarone and does not have any complaints currently.  Holding oral Amio 200 mg daily due to complaints of nausea once again and hyperthyroid state. Will need adjustment of her levothyroxine by the primary team.  We have stopped all QT prolonging medications including her Celexa and Zofran.  Zofran has been switched to Compazine. Baseline labs since starting amio: TSH 0.108, T41.72.  AST 16, ALT 19, alk phosphatase 55  AKI Patient with mild AKI with previous normal renal function prior to admission.  After discussing with the patient this appears to be due to poor oral intake.  Patient says whenever she gets anesthesia she often has a lack of appetite and does not eat or drink.  Will order some Ensure and have encouraged trying to return back to her normal diet   Cholecystitis s/p lap cholecystectomy Underwent cholecystectomy on 11/19/2022.  General surgery following   Longstanding persistent A-fib/flutter Ventricular paced on the monitor.  Complains of slight shortness of breath however typical and at baseline per patient.  Patient not requiring medication to control rhythm.  Heart rate has been in the 60s throughout admission. Continue Xarelto 20 mg daily. May require reduced dosing now due to reduced CrCl, will consult with MD.   History of CHB status post PPM Ventricularly paced on telemetry   History of AV cushion defect repair at age 64 Echocardiogram did not assess.   Hyperlipidemia Continue Crestor 20 mg daily   Essential tremor   Switched home propanolol  to carvedilol 6.25 BID. Monitor for tremors and can switch back if needed.    For questions  or updates, please contact [facility] Please consult [email or web address] for contact info under        Signed, Abagail Kitchens, PA-C  11/24/2022, 7:57 AM     Attending Note:    The patient was seen and examined.  Agree with assessment and plan as noted above.  Changes made to the above note as needed.  Patient seen and independently examined with .   We discussed all aspects of the encounter. I agree with the assessment and plan as stated above.    Takotsubo syndrome:  seems to be improving .  Cont coreg.   Holding ARB for now given renal insufficiency   2.  Nonsustained VT:  much better on amiodarone .  Was on IV amio and she was transitioned to PO amio.  this was stopped due to nausea and abn. TSH. Her NS VT will hopefully improve as her Takotsubo improves.  Continue coreg 6.25 BID   She has had abnormal TSH in the past In Oct. 2021 , TSH was 15.8 November 23, 2022, TSH is 0.108.  She should see an endocrinologist at some point   She is doing well from a cardiac standpoint . She can be DC'd from a cardiac standpoint   I have spent a total of 40 minutes with patient reviewing hospital  notes , telemetry, EKGs, labs and examining patient as well as establishing an assessment and plan that was discussed with the patient.  > 50% of time was spent in direct patient care.    Vesta Mixer, Montez Hageman., MD, [facility] 11/24/2022, 9:51 AM [address]  [address] Office [phone] Pager [phone]

## 2022-11-24 NOTE — Assessment & Plan Note (Signed)
TSH 0.108. T4 1.72. Most likely patient is on too high of a dose of synthroid.  - Decrease synthroid to 88 mcg.  - F/u TSH and T4 outpatient

## 2022-11-24 NOTE — TOC Progression Note (Signed)
Transition of Care [facility]) - Progression Note    Patient Details  Name: Mercedes Barr MRN: [id number] Date of Birth: 06-14-1946  Transition [facility]) CM/SW Contact  Leone Haven, RN Phone Number: 11/24/2022, 10:05 AM  Clinical Narrative:    From home , NCM offered choice for HHPT, HHOT, she chose [facility] , stating she had them before for her knee surgery,  NCM made referral to [facility], she is able to take referral.  Soc will begin 24 to 48 hrs post dc.  Daughter and [facility]IN will transport her home at dc.   Expected Discharge Plan: Home w Home Health Services Barriers to Discharge: No Barriers Identified  Expected Discharge Plan and Services In-house Referral: NA Discharge Planning Services: CM Consult Post Acute Care Choice: Home Health Living arrangements for the past 2 months: Single Family Home                 DME Arranged: N/A DME Agency: NA       HH Arranged: PT, OT HH Agency: [facility] Date HH Agency Contacted: 11/24/22 Time HH Agency Contacted: 1004 Representative spoke with at [facility] Agency: Tresa Endo   Social Determinants of Health (SDOH) Interventions SDOH Screenings   Food Insecurity: No Food Insecurity (11/18/2022)  Housing: Low Risk  (11/18/2022)  Transportation Needs: No Transportation Needs (11/18/2022)  Utilities: Not At Risk (11/18/2022)  Alcohol Screen: Low Risk  (09/27/2022)  Depression (PHQ2-9): Medium Risk (11/02/2022)  Financial Resource Strain: Low Risk  (09/27/2022)  Physical Activity: Insufficiently Active (09/27/2022)  Social Connections: Moderately Isolated (09/27/2022)  Stress: No Stress Concern Present (09/27/2022)  Tobacco Use: Low Risk  (11/21/2022)    Readmission Risk Interventions     No data to display

## 2022-11-24 NOTE — Progress Notes (Signed)
Mobility Specialist Progress Note:   11/24/22 1300  Mobility  Activity Ambulated with assistance in hallway  Level of Assistance Standby assist, set-up cues, supervision of patient - no hands on  Assistive Device Front wheel walker  Distance Ambulated (ft) 300 ft  Activity Response Tolerated well  Mobility Referral Yes  $Mobility charge 1 Mobility   Pt eager for mobility session. Required no physical assistance throughout, supervision for safety. Pt back in bed with all needs met.  Addison Lank Mobility Specialist Please contact via SecureChat or  Rehab office at [phone]

## 2022-11-25 ENCOUNTER — Other Ambulatory Visit (HOSPITAL_COMMUNITY): Payer: Self-pay

## 2022-11-25 DIAGNOSIS — K819 Cholecystitis, unspecified: Secondary | ICD-10-CM

## 2022-11-25 LAB — BASIC METABOLIC PANEL
Anion gap: 13 (ref 5–15)
BUN: 22 mg/dL (ref 8–23)
CO2: 22 mmol/L (ref 22–32)
Calcium: 8.4 mg/dL — ABNORMAL LOW (ref 8.9–10.3)
Chloride: 103 mmol/L (ref 98–111)
Creatinine, Ser: 1.04 mg/dL — ABNORMAL HIGH (ref 0.44–1.00)
GFR, Estimated: 56 mL/min — ABNORMAL LOW (ref >60)
Glucose, Bld: 190 mg/dL — ABNORMAL HIGH (ref 70–99)
Potassium: 4.1 mmol/L (ref 3.5–5.1)
Sodium: 138 mmol/L (ref 135–145)

## 2022-11-25 LAB — GLUCOSE, CAPILLARY
Glucose-Capillary: 134 mg/dL — ABNORMAL HIGH (ref 70–99)
Glucose-Capillary: 160 mg/dL — ABNORMAL HIGH (ref 70–99)
Glucose-Capillary: 163 mg/dL — ABNORMAL HIGH (ref 70–99)
Glucose-Capillary: 169 mg/dL — ABNORMAL HIGH (ref 70–99)

## 2022-11-25 MED ORDER — LEVOTHYROXINE SODIUM 88 MCG PO TABS
88.0000 ug | ORAL_TABLET | Freq: Every day | ORAL | 0 refills | Status: DC
  Filled 2022-11-25: qty 30, 30d supply, fill #0

## 2022-11-25 MED ORDER — FUROSEMIDE 20 MG PO TABS
40.0000 mg | ORAL_TABLET | Freq: Every day | ORAL | 2 refills | Status: DC | PRN
Start: 2022-11-25 — End: 2023-08-16
  Filled 2022-11-25: qty 180, 90d supply, fill #0

## 2022-11-25 MED ORDER — ACETAMINOPHEN 325 MG PO TABS: 650.0000 mg | ORAL_TABLET | Freq: Four times a day (QID) | ORAL | 0 refills | Status: AC | PRN

## 2022-11-25 MED ORDER — CARVEDILOL 6.25 MG PO TABS
6.2500 mg | ORAL_TABLET | Freq: Two times a day (BID) | ORAL | 0 refills | Status: DC
  Filled 2022-11-25: qty 60, 30d supply, fill #0

## 2022-11-25 NOTE — Progress Notes (Signed)
Rounding Note    Patient Name: Mercedes Barr Date of Encounter: 11/25/2022  [facility] Cardiologist: Thurmon Fair, MD   Subjective   Patient is nauseous today after starting amiodarone PO yesterday. Denies any chest pain, heart palpitations, shortness of breath.  No runs of V. tach noted on telemetry.  Pt is feeling well today  Eating and drinking better  Has ambulated   Inpatient Medications    Scheduled Meds:  acetaminophen  650 mg Oral Q6H   carvedilol  6.25 mg Oral BID WC   empagliflozin  25 mg Oral Daily   feeding supplement  237 mL Oral BID BM   FLUoxetine  10 mg Oral Daily   fluticasone furoate-vilanterol  1 puff Inhalation Daily   insulin aspart  0-5 Units Subcutaneous QHS   insulin aspart  0-9 Units Subcutaneous TID WC   latanoprost  1 drop Both Eyes QHS   levothyroxine  88 mcg Oral Q0600   rivaroxaban  20 mg Oral Q supper   rosuvastatin  20 mg Oral Daily   sodium chloride flush  3 mL Intravenous Q12H   Continuous Infusions:  sodium chloride     PRN Meds: sodium chloride, alum & mag hydroxide-simeth, HYDROmorphone (DILAUDID) injection, methocarbamol, nitroGLYCERIN, polyethylene glycol, povidone-iodine, prochlorperazine, sodium chloride flush   Vital Signs    Vitals:   11/25/22 0058 11/25/22 0434 11/25/22 0748 11/25/22 0838  BP: (!) 115/50 (!) 123/43 (!) 112/48   Pulse: 62 62 (!) 59 60  Resp: 18 19 19 18   Temp: 98.1 F (36.7 C) 98.1 F (36.7 C)    TempSrc: Oral Oral    SpO2: 98% 100% 99% 99%  Weight:  63.5 kg    Height:        Intake/Output Summary (Last 24 hours) at 11/25/2022 0844 Last data filed at 11/24/2022 2200 Gross per 24 hour  Intake 550 ml  Output 250 ml  Net 300 ml       11/25/2022    4:34 AM 11/22/2022    4:07 AM 11/21/2022    4:19 AM  Last 3 Weights  Weight (lbs) 139 lb 15.9 oz 140 lb 1.6 oz 143 lb 4.8 oz  Weight (kg) 63.5 kg 63.549 kg 65 kg      Telemetry    V paced   ECG    No new tracings.- Personally  Reviewed  Physical Exam   Physical Exam: Blood pressure (!) 112/48, pulse 60, temperature 98.1 F (36.7 C), temperature source Oral, resp. rate 18, height 5' 2.01" (1.575 m), weight 63.5 kg, SpO2 99 %.       GEN:  elderly, thin female  in no acute distress HEENT: Normal NECK: No JVD; No carotid bruits LYMPHATICS: No lymphadenopathy CARDIAC: RRR , no murmurs, rubs, gallops RESPIRATORY:  Clear to auscultation without rales, wheezing or rhonchi  ABDOMEN: Soft, non-tender, non-distended MUSCULOSKELETAL:  No edema; No deformity  SKIN: Warm and dry NEUROLOGIC:  Alert and oriented x 3   Labs    High Sensitivity Troponin:   Recent Labs  Lab 11/19/22 1755 11/19/22 2008 11/19/22 2138 11/20/22 0824 11/20/22 1105  TROPONINIHS 68* 58* 56* 43* 91*      Chemistry Recent Labs  Lab 11/21/22 0112 11/21/22 1546 11/22/22 0058 11/23/22 0041 11/23/22 0927 11/24/22 0044 11/25/22 0044  NA 139 136 137 134*  --  135 138  K 4.1 3.9 3.8 4.9  --  3.8 4.1  CL 102 101 102 103  --  105 103  CO2 15*  --  16* 22  GLUCOSE 148* 127* 164* 159*  --  150* 190*  BUN --  22 22  CREATININE 0.97 0.82 0.87 1.13*  --  1.20* 1.04*  CALCIUM 8.5* 8.2* 8.5* 8.2*  --  8.2* 8.4*  MG  --  2.7* 2.4 2.4  --   --   --   PROT 6.5 6.5  --   --  6.2*  --   --   ALBUMIN 2.8* 2.9*  --   --  2.6*  --   --   AST 28 24  --   --  16  --   --   ALT 30 25  --   --  19  --   --   ALKPHOS 48 52  --   --  55  --   --   BILITOT 0.7 1.1  --   --  1.6*  --   --   GFRNONAA >60 >60 >60 51*  --  47* 56*  ANIONGAP 16*  --  14 13     Lipids  Recent Labs  Lab 11/24/22 1215  CHOL 90  TRIG 129  HDL 23*  LDLCALC 41  CHOLHDL 3.9    Hematology Recent Labs  Lab 11/20/22 0112 11/21/22 0112 11/22/22 0618  WBC 9.8 7.5 9.4  RBC 4.84 4.69 5.62*  HGB 13.0 12.6 15.2*  HCT 42.5 41.5 48.7*  MCV 87.8 88.5 86.7  MCH 26.9 26.9 27.0  MCHC 30.6 30.4 31.2  RDW 13.7 13.6 13.2  PLT 239 203 247     Thyroid  Recent Labs  Lab 11/23/22 0927  TSH 0.108*  FREET4 1.72*     BNPNo results for input(s): "BNP", "PROBNP" in the last 168 hours.  DDimer No results for input(s): "DDIMER" in the last 168 hours.   Radiology    No results found.  Cardiac Studies   Left heart catheterization 11/20/2022  Prox RCA lesion is 30% stenosed.   Prox LAD lesion is 40% stenosed.   1st Diag lesion is 80% stenosed.   1.  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 2.  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   Echocardiogram 11/20/2022 Comparison(s): Changes from prior study are noted. LVEF is worse now,  30-35% with RWMA.   FINDINGS   Left Ventricle: Left ventricular ejection fraction, by estimation, is 30  to 35%. The left ventricle has moderately decreased function. The left  ventricle demonstrates regional wall motion abnormalities. The left  ventricular internal cavity size was  normal in size. There is no left ventricular hypertrophy. Left ventricular  diastolic function could not be evaluated.     LV Wall Scoring:  The mid anteroseptal segment, mid anterolateral segment, mid inferoseptal  segment, and mid anterior segment are akinetic. The posterior wall, basal  anteroseptal segment, and mid inferior segment are hypokinetic.   Right Ventricle: The right ventricular size is not well visualized. Right  vetricular wall thickness was not assessed. Right ventricular systolic  function was not well visualized.   Left Atrium: Left atrial size was not assessed.   Right Atrium: Right atrial size was not assessed.   Pericardium: There is no evidence of pericardial effusion.   Mitral Valve: The mitral valve was not assessed.   Tricuspid  Valve: The tricuspid valve is not assessed.   Aortic Valve: The  aortic valve was not assessed.   Pulmonic Valve: The pulmonic valve was not assessed.   Aorta: Aortic root could not be assessed.   Venous: The inferior vena cava was not well visualized.   IAS/Shunts: The interatrial septum was not assessed. 43.  Patient Profile     77 y.o. female with a hx of atrioventricular cushion defect repair age 19 complicated by CHB s/p pacemaker with revision of system in 1999 with last gen change 2018 ([employer]), longstanding persistent afib/flutter on Xarelto, arthritis, asthma, COPD, chronic HFpEF, DM, HTN, hypothyroidism, GERD who was admitted 11/18/2022 and now has underwent lap chole and heart catheterization for acute onset of chest pain   Assessment & Plan   Acute onset of chest pain with wall motion abnormalities on TTE in EKG inversions Takotsubo cardiomyopathy Hypertension  She is improving symptomatically . Was on Entresto.  The entresto was held yesterday for slight increase in creatinine. She is eating and drinking better today and her creatinine is on the way back down I think she would tolerate her home dose of Losartan 25 mg a day  Consider adding spironolactone as OP ( as BP allows     Nonsustained ventricular tachycardia  Ventricular ectopy She was treated with IV amio for several days.  The amio was stopped due to nausea, poor appetite ( which also could be due to her cholecystitic and post anesthesia Nausea.  She is now on coreg and seems to be doing well    We have stopped all QT prolonging medications including her Celexa and Zofran.  Zofran has been switched to Compazine. Baseline labs since starting amio: TSH 0.108, T41.72.  AST 16, ALT 19, alk phosphatase 55  AKI Improved slightly    Cholecystitis s/p lap cholecystectomy Underwent cholecystectomy on 11/19/2022.  General surgery following   Longstanding persistent A-fib/flutter Ventricular paced on the monitor.  Complains of slight shortness of breath however typical and  at baseline per patient.  Patient not requiring medication to control rhythm.  Heart rate has been in the 60s throughout admission. Continue Xarelto 20 mg daily.     History of CHB status post PPM Ventricularly paced on telemetry       Hyperlipidemia Continue Crestor 20 mg daily   Essential tremor   Switched home propanolol  to carvedilol 6.25 BID. Monitor for tremors and can switch back if needed.

## 2022-11-25 NOTE — TOC Transition Note (Signed)
Transition of Care [facility]) - CM/SW Discharge Note   Patient Details  Name: Mercedes Barr MRN: [id number] Date of Birth: 11-Jul-1946  Transition of Care [facility]) CM/SW Contact:  Leone Haven, RN Phone Number: 11/25/2022, 10:57 AM   Clinical Narrative:    Patient is for dc today, she is set up with [facility] for HHPT, HHOT, NCM notified Kelly with [facility] regarding dc.    Final next level of care: Home w Home Health Services Barriers to Discharge: No Barriers Identified   Patient Goals and CMS Choice CMS Medicare.gov Compare Post Acute Care list provided to:: Patient Choice offered to / list presented to : Patient  Discharge Placement                         Discharge Plan and Services Additional resources added to the After Visit Summary for   In-house Referral: NA Discharge Planning Services: CM Consult Post Acute Care Choice: Home Health          DME Arranged: N/A DME Agency: NA       HH Arranged: PT, OT HH Agency: [facility] Date HH Agency Contacted: 11/24/22 Time HH Agency Contacted: 1004 Representative spoke with at [facility] Agency: Tresa Endo  Social Determinants of Health (SDOH) Interventions SDOH Screenings   Food Insecurity: No Food Insecurity (11/18/2022)  Housing: Low Risk  (11/18/2022)  Transportation Needs: No Transportation Needs (11/18/2022)  Utilities: Not At Risk (11/18/2022)  Alcohol Screen: Low Risk  (09/27/2022)  Depression (PHQ2-9): Medium Risk (11/02/2022)  Financial Resource Strain: Low Risk  (09/27/2022)  Physical Activity: Insufficiently Active (09/27/2022)  Social Connections: Moderately Isolated (09/27/2022)  Stress: No Stress Concern Present (09/27/2022)  Tobacco Use: Low Risk  (11/21/2022)     Readmission Risk Interventions     No data to display

## 2022-11-25 NOTE — Progress Notes (Signed)
IV and tele discontinued. Pt given medications and AVS. Educated pt and family on follow up apts, discharge instructions, and medications. Deny any questions at this time. Wheelchair to [employer].

## 2022-11-25 NOTE — Progress Notes (Signed)
Mobility Specialist Progress Note:   11/25/22 0915  Mobility  Activity Ambulated with assistance in hallway  Level of Assistance Standby assist, set-up cues, supervision of patient - no hands on  Assistive Device Front wheel walker  Distance Ambulated (ft) 400 ft  Activity Response Tolerated well  Mobility Referral Yes  $Mobility charge 1 Mobility   Pt eager for mobility session. Required no physical assistance throughout. No c/o during ambulation. Pt back in bed with all needs met, eager for d/c.  Addison Lank Mobility Specialist Please contact via SecureChat or  Rehab office at [phone]

## 2022-11-25 NOTE — Discharge Summary (Addendum)
Family Medicine Teaching [facility] Discharge Summary  Patient name: Mercedes Barr Medical record number: [id number] Date of birth: 03-27-1946 Age: 77 y.o. Gender: female Date of Admission: 11/18/2022  Date of Discharge: 11/25/22  Admitting Physician: Cora Collum, DO  Primary Care Provider: Evelena Leyden, DO Consultants: Cardiology, Heart Failure, EP, General Surgery   Indication for Hospitalization: Cholecystitis  Discharge Diagnoses/Problem List:  Principal Problem for Admission: Cholecystitis  Other Problems addressed during stay:  Principal Problem:   Takotsubo cardiomyopathy Active Problems:   Ventricular ectopy   Hypothyroidism   AKI (acute kidney injury)   Cholecystitis    Brief Hospital Course:  Mercedes Barr is a 77 y.o.female with a history of complete heart block with ICD, A-fib on Xarelto, history of AV cushion repair in 1988 hypothyroidism, who was admitted to the [facility] Teaching Service at [facility] for abdominal pain. Her hospital course is detailed below:  Acute cholecystitis Patient presented with right-sided abdominal pain that radiated to her back.  Right upper quadrant ultrasound on 4/12 was significant for cholelithiasis and equivocal for cholecystitis there was right upper quadrant tenderness with borderline gallbladder wall thickening but no pericholecystic edema.  CT abdomen pelvis with contrast had similar findings.  General surgery was consulted and recommended laparoscopic cholecystectomy.  Xarelto was held and cardiology was consulted prior to surgery.  Patient had laparoscopic cholecystectomy without complication on 4/13.  She continued to have soreness at the surgical site but otherwise improved and remained stable in terms of abdominal and gallbladder standpoint.  She will follow-up with general surgery outpatient.    Takotsubo cardiomyopathy On 4/13 after surgery patient complained of sternal chest pain.  She noted to have multiple runs of  V. tach postoperatively.  EKG showed wide inverted T waves though troponins had actually decreased from previous day.  Cardiology was consulted and repeat EKG worsened.  Aspirin 325 was started and echo was ordered.  Pain worsened on 4/14 cardiology was called to the bedside and bedside TTE showed mid to apical septal and anterior WMA representing a stress cardiomyopathy.  Patient was taken to cath on 4/14.  Left heart cath had evidence of 80% stenosis in the first diagonal otherwise 40% in the LAD.  Formal echo after cath showed evidence of Takotsubo cardiomyopathy.  Cardiology started GDMT with Entresto, Crestor, carvedilol and heart failure team started to follow.  Patient was monitored with cardiac telemetry.  Patient continued to have nausea as a persistent symptom and was unable to advance diet.  Xarelto was restarted after being approved by general surgery.  On 4/15 patient started to have multiple runs of V. tach on the telemetry monitor.  EKG appeared worsened with QT prolongation and worsening peaked T waves.  Patient was given magnesium sulfate and a one-time dose of Lopressor.  QT prolonging medications were stopped or transition to alternatives.  Patient was started on amiodarone drip for ventricular ectopy.  Ventricular ectopy improved. On 4/16 patient was transitioned to p.o. amiodarone.  Nausea at this time improved and patient advanced diet.  Nausea thought to be most likely due to patient being in V. tach.  TSH and T4 came back to be elevated.  Thus, cardiology recommended discontinuing amiodarone on 4/18.  Patient remained stable without any runs of V. tach overnight on telemetry.  Thus patient will be discharged on home losartan 25 mg, carvedilol 6.25 mg twice daily, Crestor 20 mg, and Lasix 40 mg as needed.  She will be followed by cardiology outpatient.  AKI  Patient had creatinine of 1.20 on 4/18 which was above her baseline of approximately 0.8.  [name] was held and patient was thought to  most likely have AKI from decreased blood pressures and setting of starting Entresto as well as hypovolemia in the setting of nausea and decreased p.o. intake status post laparoscopic cholecystectomy and nausea secondary to runs of V. tach.  Patient was encouraged to increase p.o. and after additional fluid bolus of 250 cc AKI resolved on 4/19.  Home losartan was restarted and restarting [name] will be revisited at outpatient cardiology appointment.  Hypothyroidism Patient was initially continued on home Synthroid of 112 mcg however TSH and T4 returned elevated.  This was after patient had been started on amiodarone for ventricular ectopy.  However also thought to be due to to upper therapeutic dose of Synthroid.  Patient has not had TSH and T4 checked in some years.  Synthroid was reduced to dose of 88 mcg.  Patient will need recheck of TSH and T4 in approximately 2 to 4 weeks.  Essential tremor Patient had been on propranolol for essential tremor however after diagnosis of Takotsubo already a myopathy cardiology was consulted and switched patient to carvedilol 6.25 mg twice daily as this would also help with her cardiomyopathy.  Other chronic conditions were medically managed with home medications and formulary alternatives as necessary (depression, A-fib)  PCP Follow-up Recommendations: Repeat ECHO in 3 months  Decreased levothyroxine dose, follow up TSH and T4 in 2-4 weeks  Repeat Cr after starting back losartan, consider restart spiro/entresto at outpatient cardiology appointment. Ensure follow up with cardiology and general surgery   Disposition: Home with [facility]  Discharge Condition: Stable   Discharge Exam:  Vitals:   11/25/22 0838 11/25/22 1150  BP:  112/66  Pulse: 60 64  Resp: 18 17  Temp:  97.8 F (36.6 C)  SpO2: 99% 100%   Physical Exam: General: Well-appearing, bright and more energetic Cardiovascular: RRR, 2 out of 6 soft systolic murmur, cap refill less than 2  seconds Respiratory: Normal work of breathing, CTAB Abdomen: Soft, nondistended, mildly tender in left lower quadrant Extremities: No bilateral lower extremity edema    Significant Procedures:   Laparoscopic Cholecystectomy  Laparoscopic cholecystectomy with lysis of adhesions x 40 minutes  No complications   Echocardiogram Limited 4/14  1. Limited Echo to evaluate for LVEF and RWMA.   2. Limited echo windows, PLAX and SAX. Doppler analysis not performed.   3. Left ventricular ejection fraction, by estimation, is 30 to 35%. The  left ventricle has moderately decreased function. RWMA assessment is  limited. RWMA is present (please see the LV wall scoring below). No apical  windows. Left ventricular diastolic  function could not be evaluated.   Comparison(s): Changes from prior study are noted. LVEF is worse now,  30-35% with RWMA.   4/14 Cardiac Catheterization    Prox RCA lesion is 30% stenosed.   Prox LAD lesion is 40% stenosed.   1st Diag lesion is 80% stenosed.   1.  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 2.  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   Significant Labs and Imaging:  No results for input(s): "WBC", "HGB", "HCT", "PLT" in the last 48 hours. Recent Labs  Lab 11/24/22 0044 11/25/22 0044  NA 135 138  K 3.8 4.1  CL 105 103  CO2  16* 22  GLUCOSE 150* 190*  BUN 22 22  CREATININE 1.20* 1.04*  CALCIUM 8.2* 8.4*    US Abdomen Limited RUQ Cholelithiasis. Equivocal for cholecystitis, there is right upper quadrant tenderness and borderline gallbladder wall thickening but no pericholecystic edema by CT or ultrasound.  CT Abdomen Pelvis W Contrast 1. No acute finding. 2. Cholelithiasis without signs of acute cholecystitis. 3. Atherosclerosis.  Evidence of prior left  ventricular infarct.  Results/Tests Pending at Time of Discharge: None  Discharge Medications:  Allergies as of 11/25/2022       Reactions   Chlorhexidine Itching, Rash   Naproxen Itching, Swelling   Pregabalin Nausea And Vomiting, Itching   Statins Other (See Comments)   Leg pains PT IS ABLE TO TOLERATE CRESTOR   Oxycodone Itching   Amitriptyline Hcl Itching, Rash   Benazepril Hcl Itching, Rash   Gabapentin Itching   Oxycodone-acetaminophen Itching   Propoxyphene Itching        Medication List     STOP taking these medications    Fish Oil 1000 MG Caps   fluticasone 110 MCG/ACT inhaler Commonly known as: Flovent HFA   potassium chloride SA 20 MEQ tablet Commonly known as: KLOR-CON M   propranolol 40 MG tablet Commonly known as: INDERAL       TAKE these medications    acetaminophen 325 MG tablet Commonly known as: TYLENOL Take 2 tablets (650 mg total) by mouth every 6 (six) hours as needed.   AeroChamber Plus inhaler Use as instructed   albuterol 108 (90 Base) MCG/ACT inhaler Commonly known as: VENTOLIN HFA INHALE 2 PUFFS EVERY 4 HOURS AS NEEDED FOR WHEEZING   carvedilol 6.25 MG tablet Commonly known as: COREG Take 1 tablet (6.25 mg total) by mouth 2 (two) times daily with a meal.   cetirizine 10 MG tablet Commonly known as: ZyrTEC Allergy Take 1 tablet (10 mg total) by mouth daily.   citalopram 20 MG tablet Commonly known as: CELEXA TAKE 1 TABLET BY MOUTH IN THE  MORNING   diclofenac Sodium 1 % Gel Commonly known as: Voltaren Arthritis Pain Apply 2 g topically 4 (four) times daily.   empagliflozin 25 MG Tabs tablet Commonly known as: JARDIANCE Take 1 tablet (25 mg total) by mouth daily. What changed: Another medication with the same name was removed. Continue taking this medication, and follow the directions you see here.   EpiPen 2-Pak 0.3 mg/0.3 mL Soaj injection Generic drug: EPINEPHrine Inject 0.3 mLs (0.3 mg  total) into the muscle as   needed for severe allergic  reaction, and contact  medical provider.   famotidine 20 MG tablet Commonly known as: PEPCID TAKE 1 TABLET BY MOUTH TWICE A DAY   furosemide 20 MG tablet Commonly known as: LASIX Take 2 tablets (40 mg total) by mouth daily as needed. What changed:  how much to take when to take this reasons to take this   guaifenesin 100 MG/5ML syrup Commonly known as: ROBITUSSIN TAKE 10 ML BY MOUTH 4 TIMES A DAY AS NEEDED FOR COUGH   HYDROcodone-acetaminophen 5-325 MG tablet Commonly known as: NORCO/VICODIN Take half a tablet at night as needed for pain.   insulin lispro 100 UNIT/ML KwikPen Commonly known as: HumaLOG KwikPen Inject 12 Units into the skin 2 (two) times daily before a meal.   Insulin Pen Needle 31G X 4 MM Misc 1 each by Does not apply route daily.   BD Pen Needle Nano U/F 32G X 4 MM Misc Generic drug: Insulin  Pen Needle USE AS DIRECTED WITH INSULIN 4  TIMES DAILY   Lancets Misc Use with blood sugar monitor to check daily   Lantus SoloStar 100 UNIT/ML Solostar Pen Generic drug: insulin glargine INJECT SUBCUTANEOUSLY 40 UNITS  DAILY   levothyroxine 88 MCG tablet Commonly known as: SYNTHROID Take 1 tablet (88 mcg total) by mouth daily at 6 (six) AM. Start taking on: November 26, 2022 What changed:  medication strength how much to take when to take this additional instructions   losartan 25 MG tablet Commonly known as: COZAAR TAKE 1 TABLET BY MOUTH DAILY   omeprazole 20 MG capsule Commonly known as: PRILOSEC TAKE 1 CAPSULE BY MOUTH  TWICE DAILY   ONE TOUCH ULTRA 2 w/Device Kit Use to check blood sugar as directed.   ONE TOUCH ULTRA TEST test strip Generic drug: glucose blood Use for blood sugar testing 1-3 times daily   Prodigy No Coding Blood Gluc test strip Generic drug: glucose blood USE THREE TIMES A DAY TO CHECK ON BLOOD GLUCOSE LEVEL   polyethylene glycol powder 17 GM/SCOOP powder Commonly known as: GLYCOLAX/MIRALAX TAKE  17 GRAMS BY MOUTH 2  TIMES DAILY AS NEEDED FOR  LAXATIVE   rosuvastatin 20 MG tablet Commonly known as: CRESTOR TAKE 1 TABLET BY MOUTH EVERY DAY   Symbicort 80-4.5 MCG/ACT inhaler Generic drug: budesonide-formoterol INHALE 2 PUFFS BY MOUTH INTO THE LUNGS TWICE A DAY   Travoprost (BAK Free) 0.004 % Soln ophthalmic solution Commonly known as: TRAVATAN Place 1 drop into both eyes at bedtime.   Trulicity 0.75 MG/0.5ML Sopn Generic drug: Dulaglutide Inject 0.75 mg into the skin once a week.   Xarelto 20 MG Tabs tablet Generic drug: rivaroxaban TAKE 1 TABLET BY MOUTH DAILY WITH SUPPER        Discharge Instructions: Please refer to Patient Instructions section of EMR for full details.  Patient was counseled important signs and symptoms that should prompt return to medical care, changes in medications, dietary instructions, activity restrictions, and follow up appointments.   Follow-Up Appointments:  Follow-up Information     Maczis, Hedda Slade, [place]. Go on 12/13/2022.   Specialty: General Surgery Why: follow up on 5/7 at 10:15 am., Please arrive 30 minutes early to complete check in, and bring photo ID and insurance card. Contact information: [address] [place] [address] [phone]         [facility] Follow up.   Specialty: Home Health Services Why: Agency will call you to set up apt times, this is the main office but they may send out another branch to see you Contact information: [address] [place] [address] [phone]         Thurmon Fair, MD Follow up.   Specialty: Cardiology Why: Please go to appointment 4/29 4:20 pm. Contact information: [address] [place] [address] [phone]         Evelena Leyden, DO Follow up.   Specialty: Family Medicine Why: Please go to appointment on 4/22 at 3:10 pm. Contact information: [address] [place] [address] [phone]                 I reviewed medical decision making and verified the service and findings are accurately documented in the intern's note.  Erick Alley, DO 11/25/2022 1:20 PM  [name], MD 11/25/2022, 11:45 AM PGY-1, [facility] Family Medicine

## 2022-11-25 NOTE — Progress Notes (Signed)
   Heart Failure Stewardship Pharmacist Progress Note   PCP: Evelena Leyden, DO PCP-Cardiologist: Thurmon Fair, MD    HPI:  77 yo F with PMH of afib, CHB s/p PPM, asthma, CHF, COPD, GERD, T2DM, and HTN.   Presented to the ED on 4/12 with chest pain and abdominal pain. Ultrasound showed gallstones, surgery consulted. Planed cholecystectomy for acute cholecystitis, done on 4/13. Cardiology also consulted with rising troponin. Developed worsening chest pain with EKG changes on 4/14. ECHO showed EF 30-35% with regional wall motion abnormalities. Taken to cath lab and found to have mild to moderate obstructive disease, recommending medical management for Takotsubo cardiomyopathy. LVEDP 19. Multiple episodes of NSVT on 4/15, not responding to metoprolol, started on IV amiodarone.  Current HF Medications: Beta Blocker: carvedilol 6.25 mg BID SGLT2i: Jardiance 25 mg daily  Prior to admission HF Medications: Diuretic: furosemide 20 mg daily ACE/ARB/ARNI: losartan 25 mg daily SGLT2i: Jardiance 25 mg daily  Pertinent Lab Values: Serum creatinine 1.04, BUN 22, Potassium 4.1, Sodium 138, Magnesium 2.4  Vital Signs: Weight: 139 lbs (admission weight: 142 lbs) Blood pressure: 110/50s  Heart rate: 60s  I/O: net +1.1L  Medication Assistance / Insurance Benefits Check: Does the patient have prescription insurance?  Yes Type of insurance plan: [place] [employer]  Outpatient Pharmacy:  Prior to admission outpatient [facility] Is the patient willing to use [facility] TOC pharmacy at discharge? Yes Is the patient willing to transition their outpatient pharmacy to utilize a [facility] outpatient pharmacy?   Yes    Assessment: 1. Acute systolic CHF (LVEF 30-35%), due to Takotsubo cardiomyopathy. NYHA class II symptoms. - Euvolemic on exam, may need PRN lasix for discharge. Keep K>4 and Mg>2 - Continue carvedilol 6.25 mg BID - Held Entresto 24/26 mg BID with bump in SCr and hypotension. BP still  borderline. AKI improved.  - Consider starting spironolactone prior to discharge vs at follow up  - Continue Jardiance 25 mg daily  Plan: 1) Medication changes recommended at this time: - Continue current regimen  2) Patient assistance: - Entresto copay $0  3)  Education  - Patient has been educated on current HF medications and potential additions to HF medication regimen - Patient verbalizes understanding that over the next few months, these medication doses may change and more medications may be added to optimize HF regimen - Patient has been educated on basic disease state pathophysiology and goals of therapy   Sharen Hones, PharmD, BCPS Heart Failure Stewardship Pharmacist Phone [phone]

## 2022-11-25 NOTE — Progress Notes (Signed)
Daily Progress Note Intern Pager: [phone]  Patient name: Mercedes Barr Medical record number: [id number] Date of birth: May 06, 1946 Age: 77 y.o. Gender: female  Primary Care Provider: Evelena Leyden, DO Consultants: Cardiology, Heart Failure, General Surgery  Code Status: Full    Pt Overview and Major Events to Date:  4/12 - Admitted  4/13 - Lap chole with General Surgery  4/14 - Chest pain with EKG changes,  LHC 1st Diag lesion is 80% stenosed, ECHO consistent w/ takotsobu  4/15 - Code status changed to Full Code per patient, started on amiodarone gtt for ectopy  4/17 - Transitioned to po amiodarone  4/18 - Discontinued amiodarone   Assessment and Plan: Mercedes Barr is a 77 y.o. female  p/w epigastric and RUQ pain found to have acute cholecystitis, now s/p lap chole (4/13). Hospital course complicated by chest pain found to have takotsobu cardiomyopathy and ventricular ectopy, now improved.   * Takotsubo cardiomyopathy LHC from 4/14 with evidence of 80% stenosis in 1st diagonal. 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  - Cardiology following, continuing GDMT   - crestor   - carvedilol  - Heart failure following  - Crestor  daily - Continue telemetry  - Hold entresto and lasix for now, blood pressure has improved to be hypertensive; however, patient now has AKI.   Ventricular ectopy No ectopy on telemetry. Patient is tolerating po amiodarone.  - Cardiology following, appreciate recommendations  - Continue po amiodarone, per cardiology  - Continue carvedilol  - Continue cardiac telemetry   AKI (acute kidney injury) Cr 1.2 this morning from 0.87 48 hours ago. Most likely due to hypovolemia. Patient has not been taking in that much po due to nausea previously. Yesterday, entresto was held to not exacerbate AKI.  - Consider giving 250 cc bolus.  - Continue to hold entresto  - AM BMP   Hypothyroidism TSH 0.108. T4 1.72. Most likely patient is  on too high of a dose of synthroid.  - Decrease synthroid to 88 mcg.  - F/u TSH and T4 outpatient    Afib - Continue xarelto  Essential tremor- carvedilol 6.25 mg  Depression- continue fluoxetine  daily  FEN/GI: Regular diet  PPx: home xarelto  Dispo:Home with home health  Subjective:  Patient feels a lot better.  Denies nausea.  Says that she ate her entire breakfast.  Objective: Temp:  [97.7 F (36.5 C)-98.1 F (36.7 C)] 98.1 F (36.7 C) (04/19 0434) Pulse Rate:  [59-63] 62 (04/19 0434) Resp:  [18-19] 19 (04/19 0434) BP: (112-140)/(43-56) 123/43 (04/19 0434) SpO2:  [98 %-100 %] 100 % (04/19 0434) Weight:  [63.5 kg] 63.5 kg (04/19 0434) Physical Exam: General: Well-appearing, bright and more energetic Cardiovascular: RRR, 2 out of 6 soft systolic murmur, cap refill less than 2 seconds Respiratory: Normal work of breathing, CTAB Abdomen: Soft, nondistended, mildly tender in left lower quadrant Extremities: No bilateral lower extremity edema  Laboratory: Most recent BMP    Latest Ref Rng & Units 11/25/2022   12:44 AM  BMP  Glucose 70 - 99 mg/dL 846   BUN 8 - 23 mg/dL 22   Creatinine 9.62 - 1.00 mg/dL 9.52   Sodium 841 - 324 mmol/L 138   Potassium 3.5 - 5.1 mmol/L 4.1   Chloride 98 - 111 mmol/L 103   CO2 22 - 32 mmol/L 22   Calcium 8.9 - 10.3 mg/dL 8.4    [name], MD 11/25/2022, 7:07 AM  PGY-1, [place] Intern pager: [phone], text pages welcome Secure chat group [place]

## 2022-11-25 NOTE — Progress Notes (Signed)
PT Cancellation Note  Patient Details Name: SHELTON SOLER MRN: [id number] DOB: 1946-07-02   Cancelled Treatment:    Reason Eval/Treat Not Completed: Patient declined, no reason specified (Pt states that she was tired from ambulating with mobility specialist earlier and that she was feeling nauseous currently. Pt is expected to DC later today however if not PT will follow up on Monday.)   Sarim Rothman 11/25/2022, 2:48 PM

## 2022-11-28 ENCOUNTER — Telehealth: Payer: Self-pay

## 2022-11-28 ENCOUNTER — Ambulatory Visit (INDEPENDENT_AMBULATORY_CARE_PROVIDER_SITE_OTHER): Payer: 59 | Admitting: Family Medicine

## 2022-11-28 ENCOUNTER — Encounter: Payer: Self-pay | Admitting: Family Medicine

## 2022-11-28 VITALS — BP 108/56 | HR 65 | Ht 62.0 in | Wt 135.2 lb

## 2022-11-28 DIAGNOSIS — I4819 Other persistent atrial fibrillation: Secondary | ICD-10-CM

## 2022-11-28 DIAGNOSIS — E039 Hypothyroidism, unspecified: Secondary | ICD-10-CM

## 2022-11-28 DIAGNOSIS — I5181 Takotsubo syndrome: Secondary | ICD-10-CM

## 2022-11-28 DIAGNOSIS — Z794 Long term (current) use of insulin: Secondary | ICD-10-CM

## 2022-11-28 DIAGNOSIS — E119 Type 2 diabetes mellitus without complications: Secondary | ICD-10-CM | POA: Diagnosis not present

## 2022-11-28 DIAGNOSIS — I10 Essential (primary) hypertension: Secondary | ICD-10-CM

## 2022-11-28 DIAGNOSIS — Z23 Encounter for immunization: Secondary | ICD-10-CM

## 2022-11-28 DIAGNOSIS — R7989 Other specified abnormal findings of blood chemistry: Secondary | ICD-10-CM | POA: Diagnosis not present

## 2022-11-28 DIAGNOSIS — I5032 Chronic diastolic (congestive) heart failure: Secondary | ICD-10-CM

## 2022-11-28 DIAGNOSIS — K819 Cholecystitis, unspecified: Secondary | ICD-10-CM

## 2022-11-28 DIAGNOSIS — G8929 Other chronic pain: Secondary | ICD-10-CM

## 2022-11-28 DIAGNOSIS — M545 Low back pain, unspecified: Secondary | ICD-10-CM

## 2022-11-28 MED ORDER — ZOSTER VAC RECOMB ADJUVANTED 50 MCG/0.5ML IM SUSR
0.5000 mL | Freq: Once | INTRAMUSCULAR | 0 refills | Status: AC
Start: 2022-11-28 — End: 2022-11-28

## 2022-11-28 MED ORDER — ROSUVASTATIN CALCIUM 20 MG PO TABS: 20.0000 mg | ORAL_TABLET | Freq: Every day | ORAL | 3 refills | Status: DC

## 2022-11-28 MED ORDER — ZOSTER VAC RECOMB ADJUVANTED 50 MCG/0.5ML IM SUSR
0.5000 mL | Freq: Once | INTRAMUSCULAR | 0 refills | Status: DC
Start: 2022-11-28 — End: 2022-11-28

## 2022-11-28 MED ORDER — TETANUS-DIPHTH-ACELL PERTUSSIS 5-2.5-18.5 LF-MCG/0.5 IM SUSP
0.5000 mL | Freq: Once | INTRAMUSCULAR | 0 refills | Status: AC
Start: 2022-11-28 — End: 2022-11-28

## 2022-11-28 MED ORDER — EMPAGLIFLOZIN 25 MG PO TABS: 25.0000 mg | ORAL_TABLET | Freq: Every day | ORAL | 3 refills | Status: DC

## 2022-11-28 MED ORDER — BACLOFEN 5 MG PO TABS: 5.0000 mg | ORAL_TABLET | Freq: Two times a day (BID) | ORAL | 0 refills | Status: DC | PRN

## 2022-11-28 NOTE — Progress Notes (Unsigned)
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 She presents with a large bag of medication bottles and asks for help sorting these out, she has some confusion regarding which medications she is supposed to be on.   Chest pain  Takosubo cardiomyopathy Patient initially started on GDMT with Entresto, Crestor, and carvedilol by cardiology inpatient. She was switched off these due to AKI and started on amiodarone drip for ventricular ectopy. Discharged on losartan 25 mg, carvedilol 6.25 mg twice daily, Crestor 20 mg, and Lasix 40 mg as needed. Plan to follow up with caridology and repeat ECHO in 3 months.   Today, she reports the following medication regimen:  Losartan 25 mg: Not taking, did not start after hospital due to confusion Carvedilol 6.25 mg twice daily: Yes, as prescribed Crestor 20 mg: Not taking, is out Lasix 40 mg as needed: Actually taking 40 mg daily (without taking potassium supp)  Her blood pressure today is low-normal and she reports intermittent orthostasis.   Has cardiology follow up scheduled with Dr. Royann Shivers 4/29.   Acute cholecystitis s/p cholecystectomy Underwent operation 4/13, Xarelto was held beforehand. Today patient reports doing well, no issues eating or drinking. Only abdominal soreness is near incision sites. She asks questions about whether they took out part of or the whole gallbladder. Discussed operation with her.   Has general surgery follow up shceduled with [facility] 5/07.  AKI Baseline thought to be 0.8. Entresto stopped when AKI discovered. Due to decreased blood pressures in the hospital, patient was restarted on losartan instead of Entresto when BP stabilized. Patient was instructed to continue losartan at home.   Today, she reports she has not taken the losartan at all  while home due to confusion regarding medications.   PERTINENT  PMH / PSH:  Patient Active Problem List   Diagnosis Date Noted   Ventricular ectopy 11/22/2022    Priority: 1.   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/20/2022    Priority: 2.   Need for diphtheria-tetanus-pertussis (Tdap) vaccine 11/29/2022   Need for shingles vaccine 11/29/2022   Cholecystitis 11/25/2022   AKI (acute kidney injury) 11/24/2022   Flank pain 11/02/2022   Greater trochanteric pain syndrome of left lower extremity 09/07/2022   Arthritis, midfoot 07/06/2022   Osteoporosis 04/03/2019   Epigastric pain 01/31/2019   Hematuria 12/27/2018   Gross hematuria 12/13/2018   Rotator cuff tendinitis, right 09/20/2017   Persistent atrial fibrillation 08/17/2017   Tremor of left hand 06/12/2015   (HFpEF) heart failure with preserved ejection fraction 05/21/2015   Difficulty walking 04/27/2015   Right hip pain 04/08/2015   Left shoulder pain 12/01/2014   Osteoarthritis, multiple sites 11/20/2014   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   Complete heart block    Normal coronary arteries-Feb 2011    Pacemaker    Weight loss, unintentional 12/21/2012   Osteoarthritis of right knee 08/22/2012   Insomnia 03/08/2012   Allergy to bee sting 10/04/2011   Low back pain 09/07/2011   Hypothyroidism 12/28/2009   GLAUCOMA 12/25/2009   GERD 01/19/2009   COPD (chronic obstructive pulmonary disease) 11/06/2008   Abnormal involuntary movement 09/03/2008   Essential hypertension 06/14/2007   DM (diabetes mellitus), type 2 11/06/2006   DEPRESSION 11/06/2006   Hyperlipidemia 11/03/2006    OBJECTIVE:   BP (!) 108/56   Pulse 65   Ht   (1.575 m)   Wt 135 lb 3.2 oz (61.3 kg)   SpO2 95%   BMI 24.73 kg/m    Gen: awake, alert, NAD, pleasant and conversant Cardiac: RRR, no murmur Resp: CTAB Abd: incision sites clean, dry and intact; surgical glue in place, no surrounding erythema or swelling, only mild TTP, BS  present  ASSESSMENT/PLAN:   Essential hypertension BP low-normal. Not currently taking any anti-HTN meds. Will have patient decrease lasix from 40 mg daily to 20 mg daily. Patient to take potassium supplement any day she takes her lasix. Has follow up with cardiology 4/29. Will have them re-assess her BP and restart Entresto and other GDMT for HF as indicated. BMP today for Cr and K.   Persistent atrial fibrillation (HCC) Cardiac exam today with regular rate and rhythm. Wonder if paroxysmal A.fib. Still on Xarelto, no known bleeding.   Takotsubo cardiomyopathy Has follow up with cardiology 4/29. Will have them order and follow up on ECHO in 3 months.   (HFpEF) heart failure with preserved ejection fraction (HCC) BP low-normal. Patient not taking losartan as recommended at discharge. Has follow up with cardiology 4/29. Will have them re-assess her BP and restart Entresto and other GDMT for HF as indicated.  - continue carvedilol - hold losartan given BP - restart rosuvastatin (refill sent today) - restart jardiance (refill sent today) - cardiology to restart Entresto when BP appropriate - removed old propanolol from home medication bag  Low back pain Patient taking methcarbamol for intermittent muscle spasm. Not on current med list, but is in home bag of prescription meds. Removed the old bottle. Will substitute baclofen, given better safety profile for her age and co-morbidities.   DM (diabetes mellitus), type 2 (HCC) - restart jardiance (refill sent, was out) - referred to ophtho   Hypothyroidism New dose levothyroxine 88 mcg, changed while inpatient. Removed old bottle of 112 mcg levothyroxine from home medication bag. Patient to return in about a month for TSH recheck.   Cholecystitis Doing well. No red flags, incision sites look to be well healing. Has follow up with [facility] 5/07.   Need for diphtheria-tetanus-pertussis (Tdap) vaccine Provided printed script.  Patient to obtain vaccine at local pharmacy.   Need for shingles vaccine Provided printed script. Patient to obtain vaccine at local pharmacy.      Fayette Pho, MD [facility]

## 2022-11-28 NOTE — Transitions of Care (Post Inpatient/ED Visit) (Signed)
   11/28/2022  Name: Mercedes Barr MRN: [id number] DOB: Jan 29, 1946  Today's TOC FU Call Status: Today's TOC FU Call Status:: Successful TOC FU Call Competed TOC FU Call Complete Date: 11/28/22  Transition Care Management Follow-up Telephone Call Date of Discharge: 11/25/22 Discharge Facility: [facility]) Type of Discharge: Inpatient Admission Primary Inpatient Discharge Diagnosis:: Cholecystitis How have you been since you were released from the hospital?: Better Any questions or concerns?: Yes Patient Questions/Concerns:: Patient confused about her medications and when she takes them Patient Questions/Concerns Addressed: Other: (did medication reconciliation with patient and advised to bring all medications to her appointment this afternoon)  Items Reviewed: Did you receive and understand the discharge instructions provided?: Yes Medications obtained and verified?: Yes (Medications Reviewed) Any new allergies since your discharge?: No Dietary orders reviewed?: Yes Type of Diet Ordered:: Heart Healthy Do you have support at home?: Yes People in Home: child(ren), adult Name of Support/Comfort Primary Source: [facility] and Equipment/Supplies: Were Home Health Services Ordered?: Yes Name of Home Health Agency:: [facility] Has Agency set up a time to come to your home?: Yes First Home Health Visit Date: 11/29/22 Any new equipment or medical supplies ordered?: No  Functional Questionnaire: Do you need assistance with bathing/showering or dressing?: No Do you need assistance with meal preparation?: No Do you need assistance with eating?: No Do you have difficulty maintaining continence: No Do you need assistance with getting out of bed/getting out of a chair/moving?: No Do you have difficulty managing or taking your medications?: No  Follow up appointments reviewed: PCP Follow-up appointment confirmed?: Yes Date of PCP follow-up appointment?: 11/28/22 Follow-up  Provider: Dr. Clayborne Artist Specialist [facility] Follow-up appointment confirmed?: Yes Date of Specialist follow-up appointment?: 12/05/22 Follow-Up Specialty Provider:: Dr. Royann Shivers Do you need transportation to your follow-up appointment?: No Do you understand care options if your condition(s) worsen?: Yes-patient verbalized understanding  SDOH Interventions Today    Flowsheet Row Most Recent Value  SDOH Interventions   Food Insecurity Interventions Intervention Not Indicated  Housing Interventions Intervention Not Indicated  Transportation Interventions Intervention Not Indicated      Interventions Today    Flowsheet Row Most Recent Value  Chronic Disease   Chronic disease during today's visit Other  [Cholecystitis]  General Interventions   General Interventions Discussed/Reviewed General Interventions Discussed, Doctor Visits  Doctor Visits Discussed/Reviewed Doctor Visits Discussed  [Appointment this afternoon]  Nutrition Interventions   Nutrition Discussed/Reviewed Nutrition Discussed  [Patient concerned about her poor appetite]  Pharmacy Interventions   Pharmacy Dicussed/Reviewed Pharmacy Topics Discussed      Jodelle Gross, RN, BSN, CCM Care Management Coordinator [facility] Phone: [phone]/Fax: [phone]

## 2022-11-28 NOTE — Patient Instructions (Addendum)
It was wonderful to see you today. Thank you for allowing me to be a part of your care. Below is a short summary of what we discussed at your visit today:  Hospital follow up See below for discussion of issues you faced while in the hospital.   Blood pressure Today we drew blood to check on your creatinine. If the results are normal, I will send you a letter or MyChart message. If the results are abnormal, I will give you a call.   REDUCE your dose of Lasix from two tabs per day to one tab per day.  See your cardiologist as planned. They will tell you when to start back Entresto.   Thyroid check While in the hospital, your levothyroxine dose was reduced to 88 mcg daily. We will need to have you return in about a month for a recheck. Please book a one month follow up before leaving today.   ECHO for heart Cardiology recommended you have another ECHO (ultrasound of your heart) in 3 months. The cardiology office will schedule this for you.  Please follow up with cardiology as scheduled. Appointment details are on the next page.   Diabetes  I have referred you to ophthalmology for routine diabetic eye exam.  Someone from their office should be calling you in 1 to 2 weeks to schedule an appointment.  If you do not hear from them, let us know. We may need to nudge along the referral.    Shingles Vaccine and TDaP booster I have written a prescription for the shingles vaccine and TDaP booster.  Please take this to your pharmacy to have this administered. Your insurance prefers you get this specific vaccine at the pharmacy instead of in clinic.   Please bring all of your medications to every appointment!  If you have any questions or concerns, please do not hesitate to contact us via phone or MyChart message.   Fayette Pho, MD

## 2022-11-29 ENCOUNTER — Telehealth: Payer: Self-pay

## 2022-11-29 ENCOUNTER — Encounter: Payer: Self-pay | Admitting: Family Medicine

## 2022-11-29 DIAGNOSIS — Z23 Encounter for immunization: Secondary | ICD-10-CM

## 2022-11-29 HISTORY — DX: Encounter for immunization: Z23

## 2022-11-29 LAB — COMPREHENSIVE METABOLIC PANEL
ALT: 53 IU/L — ABNORMAL HIGH (ref 0–32)
AST: 40 IU/L (ref 0–40)
Albumin/Globulin Ratio: 1.2 (ref 1.2–2.2)
Albumin: 3.5 g/dL — ABNORMAL LOW (ref 3.8–4.8)
Alkaline Phosphatase: 71 IU/L (ref 44–121)
BUN/Creatinine Ratio: 16 (ref 12–28)
BUN: 14 mg/dL (ref 8–27)
Bilirubin Total: <0.2 mg/dL (ref 0.0–1.2)
CO2: 26 mmol/L (ref 20–29)
Calcium: 9.2 mg/dL (ref 8.7–10.3)
Chloride: 100 mmol/L (ref 96–106)
Creatinine, Ser: 0.87 mg/dL (ref 0.57–1.00)
Globulin, Total: 2.9 g/dL (ref 1.5–4.5)
Glucose: 163 mg/dL — ABNORMAL HIGH (ref 70–99)
Potassium: 3.9 mmol/L (ref 3.5–5.2)
Sodium: 142 mmol/L (ref 134–144)
Total Protein: 6.4 g/dL (ref 6.0–8.5)
eGFR: 69 mL/min/{1.73_m2} (ref >59)

## 2022-11-29 LAB — CBC
Hematocrit: 43.6 % (ref 34.0–46.6)
Hemoglobin: 14.2 g/dL (ref 11.1–15.9)
MCH: 26.9 pg (ref 26.6–33.0)
MCHC: 32.6 g/dL (ref 31.5–35.7)
MCV: 83 fL (ref 79–97)
Platelets: 375 10*3/uL (ref 150–450)
RBC: 5.28 x10E6/uL (ref 3.77–5.28)
RDW: 13.4 % (ref 11.7–15.4)
WBC: 6.6 10*3/uL (ref 3.4–10.8)

## 2022-11-29 LAB — MAGNESIUM: Magnesium: 2.1 mg/dL (ref 1.6–2.3)

## 2022-11-29 NOTE — Assessment & Plan Note (Signed)
Provided printed script. Patient to obtain vaccine at local pharmacy.  

## 2022-11-29 NOTE — Assessment & Plan Note (Signed)
-   restart jardiance (refill sent, was out) - referred to ophtho

## 2022-11-29 NOTE — Assessment & Plan Note (Signed)
Patient taking methcarbamol for intermittent muscle spasm. Not on current med list, but is in home bag of prescription meds. Removed the old bottle. Will substitute baclofen, given better safety profile for her age and co-morbidities.

## 2022-11-29 NOTE — Assessment & Plan Note (Signed)
Doing well. No red flags, incision sites look to be well healing. Has follow up with [facility] 5/07.

## 2022-11-29 NOTE — Assessment & Plan Note (Signed)
Has follow up with cardiology 4/29. Will have them order and follow up on ECHO in 3 months.

## 2022-11-29 NOTE — Assessment & Plan Note (Signed)
Cardiac exam today with regular rate and rhythm. Wonder if paroxysmal A.fib. Still on Xarelto, no known bleeding.

## 2022-11-29 NOTE — Assessment & Plan Note (Signed)
Provided printed script. Patient to obtain vaccine at local pharmacy.

## 2022-11-29 NOTE — Assessment & Plan Note (Signed)
BP low-normal. Patient not taking losartan as recommended at discharge. Has follow up with cardiology 4/29. Will have them re-assess her BP and restart Entresto and other GDMT for HF as indicated.  - continue carvedilol - hold losartan given BP - restart rosuvastatin (refill sent today) - restart jardiance (refill sent today) - cardiology to restart Entresto when BP appropriate - removed old propanolol from home medication bag

## 2022-11-29 NOTE — Telephone Encounter (Signed)
Jae Dire [facility] PT calls nurse line requesting verbal orders for [facility] PT as follows.   1x a week for 9 weeks  Verbal order given.

## 2022-11-29 NOTE — Assessment & Plan Note (Addendum)
BP low-normal. Not currently taking any anti-HTN meds. Will have patient decrease lasix from 40 mg daily to 20 mg daily. Patient to take potassium supplement any day she takes her lasix. Has follow up with cardiology 4/29. Will have them re-assess her BP and restart Entresto and other GDMT for HF as indicated. BMP today for Cr and K.

## 2022-11-29 NOTE — Assessment & Plan Note (Signed)
New dose levothyroxine 88 mcg, changed while inpatient. Removed old bottle of 112 mcg levothyroxine from home medication bag. Patient to return in about a month for TSH recheck.

## 2022-12-05 ENCOUNTER — Ambulatory Visit: Payer: 59 | Attending: Cardiovascular Disease | Admitting: Cardiovascular Disease

## 2022-12-05 ENCOUNTER — Encounter: Payer: Self-pay | Admitting: Cardiovascular Disease

## 2022-12-05 VITALS — BP 115/67 | HR 68 | Ht 62.0 in | Wt 139.2 lb

## 2022-12-05 DIAGNOSIS — I5181 Takotsubo syndrome: Secondary | ICD-10-CM | POA: Diagnosis not present

## 2022-12-05 DIAGNOSIS — I4819 Other persistent atrial fibrillation: Secondary | ICD-10-CM | POA: Diagnosis not present

## 2022-12-05 DIAGNOSIS — I4729 Other ventricular tachycardia: Secondary | ICD-10-CM

## 2022-12-05 DIAGNOSIS — Q212 Atrioventricular septal defect, unspecified as to partial or complete: Secondary | ICD-10-CM

## 2022-12-05 DIAGNOSIS — D6869 Other thrombophilia: Secondary | ICD-10-CM | POA: Diagnosis not present

## 2022-12-05 DIAGNOSIS — Z950 Presence of cardiac pacemaker: Secondary | ICD-10-CM

## 2022-12-05 DIAGNOSIS — E119 Type 2 diabetes mellitus without complications: Secondary | ICD-10-CM

## 2022-12-05 DIAGNOSIS — I10 Essential (primary) hypertension: Secondary | ICD-10-CM

## 2022-12-05 DIAGNOSIS — I442 Atrioventricular block, complete: Secondary | ICD-10-CM

## 2022-12-05 DIAGNOSIS — E785 Hyperlipidemia, unspecified: Secondary | ICD-10-CM

## 2022-12-05 DIAGNOSIS — I519 Heart disease, unspecified: Secondary | ICD-10-CM

## 2022-12-05 DIAGNOSIS — J42 Unspecified chronic bronchitis: Secondary | ICD-10-CM

## 2022-12-05 DIAGNOSIS — Z794 Long term (current) use of insulin: Secondary | ICD-10-CM

## 2022-12-05 DIAGNOSIS — I5032 Chronic diastolic (congestive) heart failure: Secondary | ICD-10-CM

## 2022-12-05 DIAGNOSIS — I493 Ventricular premature depolarization: Secondary | ICD-10-CM

## 2022-12-05 NOTE — Patient Instructions (Signed)
Medication Instructions:  No changes *If you need a refill on your cardiac medications before your next appointment, please call your pharmacy*  Testing/Procedures: Your physician has requested that you have a LIMITED echocardiogram. Echocardiography is a painless test that uses sound waves to create images of your heart. It provides your doctor with information about the size and shape of your heart and how well your heart's chambers and valves are working. This procedure takes approximately one hour. There are no restrictions for this procedure. Please do NOT wear cologne, perfume, aftershave, or lotions (deodorant is allowed). Please arrive 15 minutes prior to your appointment time.    Follow-Up: At [facility], you and your health needs are our priority.  As part of our continuing mission to provide you with exceptional heart care, we have created designated Provider Care Teams.  These Care Teams include your primary Cardiologist (physician) and Advanced Practice Providers (APPs -  Physician Assistants and Nurse Practitioners) who all work together to provide you with the care you need, when you need it.  We recommend signing up for the patient portal called "MyChart".  Sign up information is provided on this After Visit Summary.  MyChart is used to connect with patients for Virtual Visits (Telemedicine).  Patients are able to view lab/test results, encounter notes, upcoming appointments, etc.  Non-urgent messages can be sent to your provider as well.   To learn more about what you can do with MyChart, go to [email or web address].    Your next appointment:   1 year(s)  Provider:   Thurmon Fair, MD

## 2022-12-05 NOTE — Progress Notes (Signed)
Patient ID: Mercedes Barr, female   DOB: 10-11-1945, 77 y.o.   MRN: [id number]    Cardiology Office Note    Date:  12/12/2022   ID:  Mercedes Barr, DOB 1946/04/19, MRN [id number]  PCP:  Mercedes Leyden, DO  Cardiologist:   Mercedes Fair, MD   Chief Complaint  Patient presents with   Follow-up    History of Present Illness:  Mercedes Barr is a 77 y.o. female with complete heart block, pacemaker dependent (Medtronic Adapta 2018), history of endocardial cushion defect repair, heart failure with preserved left ventricular systolic function, longstanding persistent atrial fibrillation on chronic anticoagulation, asthma/COPD, DM2, HTN, hypothyroidism, GERD.  She was recent hospitalized with acute cholecystitis and underwent laparoscopic cholecystectomy on 11/19/2022. The day after surgery she developed chest pain and ECG changes (deep precordial T wave inversion), and her echocardiogram showed mid-- apical septal and anterior wall motion abnormality she underwent coronary angiography which showed minor lesions (30% proximal RCA, 40% proximal LAD, 80% first diagonal artery stenoses), EF 30-35%.  Findings were felt to be consistent with a diagnosis of stress cardiomyopathy (Takotsubo syndrome).  She has recovered well from both her cholecystitis and the episode of Takotsubo syndrome.  She has no current cardiovascular complaints or abdominal pain.  The patient specifically denies any chest pain at rest exertion, dyspnea at rest or with exertion, orthopnea, paroxysmal nocturnal dyspnea, syncope, palpitations, focal neurological deficits, intermittent claudication, lower extremity edema, unexplained weight gain, cough, hemoptysis or wheezing.  Pacemaker interrogation shows normal device function.  Estimated generator longevity is 8 years.  Lead parameters are normal.  She has 98.4% ventricular pacing.  There were 59 episodes of brief nonsustained ventricular tachycardia that all occurred during the  episode of Takotsubo syndrome on 04/15 - 11/22/2022.  Current pacemaker was implanted in 2018 and still has about 10 years of estimated longevity (Medtronic Adapta DR L).  Presenting rhythm is atrial flutter with complete heart block and ventricular paced rhythm.  Her device is programmed VVIR.  She has 99.6% ventricular pacing with a good heart rate histogram distribution.  She had 4 episodes of nonsustained ventricular tachycardia all of which occurred within about a 24 period in February (17-18th).  Has been compliant with remote pacemaker downloads.  She had surgical repair of atrioventricular cushion defect repair in 1988 when she was 77 years old. Surgery was complicated by development of complete heart block and she received a pacemaker (her initial device probably was an abdominal generator with epicardial leads). A right subclavian pacemaker was placed in 1991, but had to be removed because of complaints of pain. She had a complete revision of the system in 1999 and her current left subclavian atrial and ventricular leads date to that time. Her most recent generator change out occurred in 2008. The atrial lead is a Medtronic [id number]. The ventricular lead is Medtronic [id number].  She underwent a pacemaker generator change out in 2018, with a Tyrx pouch.    Past Medical History:  Diagnosis Date   Arthritis 11/06/2012   Tr TKR   Asthma    Chronic heart failure with preserved ejection fraction (HFpEF) (HCC)    Complete heart block (HCC)    Congenital heart disease    atrioventricular cushion defect repair age 68 (33)   COPD (chronic obstructive pulmonary disease) (HCC)    Diabetes mellitus    GERD (gastroesophageal reflux disease)    Heart murmur    Hypertension    Hypothyroidism    Longstanding persistent atrial  fibrillation (HCC)    Normal coronary arteries 08/08/2009   Pacemaker    PACEMAKER DEPENDENT-DR. Aryiana Klinkner - [facility].  OFFICE NOTE DR. A. LITTLE STATES   "EXTREMELY PACEMAKER SENSITIVE AND WHEN YOU TRY TO CHECK FOR UNDERLYING RHYTHMS SHE WILL HAVE SNYCOPE AND WE HAVE NOT DONE THIS NOW IN ABOUT 2 YEARS".   Shortness of breath dyspnea    walking distance or climbing stairs    Past Surgical History:  Procedure Laterality Date   ATRIOVENTRICULAR CUSHION DEFECT REPAIR  1988   CARDIAC CATHETERIZATION  2011    SHOWED NO CAD-PER CARDIOLOGY OFFICE NOTES DR. A. LITTLE   CHOLECYSTECTOMY N/A 11/19/2022   Procedure: LAPAROSCOPIC CHOLECYSTECTOMY;  Surgeon: Andria Meuse, MD;  Location: [facility] OR;  Service: General;  Laterality: N/A;   colonscopy      removed polyps    INSERT / REPLACE / REMOVE PACEMAKER     LAPAROSCOPIC LYSIS OF ADHESIONS  11/19/2022   Procedure: LAPAROSCOPIC LYSIS OF ADHESIONS;  Surgeon: Andria Meuse, MD;  Location: [facility] OR;  Service: General;;   LEFT HEART CATH AND CORONARY ANGIOGRAPHY N/A 11/20/2022   Procedure: LEFT HEART CATH AND CORONARY ANGIOGRAPHY;  Surgeon: Orbie Pyo, MD;  Location: [facility] INVASIVE CV [facility];  Service: Cardiovascular;  Laterality: N/A;   PACEMAKER INSERTION  1988   last gen 11/08- MDT   PPM GENERATOR CHANGEOUT N/A 02/15/2017   Procedure: PPM Generator Changeout;  Surgeon: Mercedes Fair, MD;  Location: [facility];  Service: Cardiovascular;  Laterality: N/A;   TOTAL KNEE ARTHROPLASTY Right 11/19/2012   Procedure: RIGHT TOTAL KNEE ARTHROPLASTY;  Surgeon: Loanne Drilling, MD;  Location: [facility] ORS;  Service: Orthopedics;  Laterality: Right;   TOTAL KNEE REVISION Right 09/24/2014   Procedure: RIGHT TOTAL KNEE ARTHROPLASTY REVISION;  Surgeon: Loanne Drilling, MD;  Location: [facility] ORS;  Service: Orthopedics;  Laterality: Right;   TUBAL LIGATION      Current Medications: Outpatient Medications Prior to Visit  Medication Sig Dispense Refill   Blood Glucose Monitoring Suppl (ONE TOUCH ULTRA 2) w/Device KIT Use to check blood sugar as directed. 1 each 0   carvedilol (COREG) 6.25 MG tablet Take 1 tablet (6.25  mg total) by mouth 2 (two) times daily with a meal. 60 tablet 0   cetirizine (ZYRTEC ALLERGY) 10 MG tablet Take 1 tablet (10 mg total) by mouth daily. 30 tablet 1   citalopram (CELEXA) 20 MG tablet TAKE 1 TABLET BY MOUTH IN THE  MORNING 90 tablet 3   diclofenac Sodium (VOLTAREN ARTHRITIS PAIN) 1 % GEL Apply 2 g topically 4 (four) times daily. 350 g 2   Dulaglutide (TRULICITY) 0.75 MG/0.5ML SOPN Inject 0.75 mg into the skin once a week. 2 mL 2   empagliflozin (JARDIANCE) 25 MG TABS tablet Take 1 tablet (25 mg total) by mouth daily. 90 tablet 3   EPIPEN 2-PAK 0.3 MG/0.3ML SOAJ injection Inject 0.3 mLs (0.3 mg  total) into the muscle as  needed for severe allergic  reaction, and contact  medical provider. 2 Device 0   famotidine (PEPCID) 20 MG tablet TAKE 1 TABLET BY MOUTH TWICE A DAY 180 tablet 1   furosemide (LASIX) 20 MG tablet Take 2 tablets (40 mg total) by mouth daily as needed. 180 tablet 2   glucose blood (ONE TOUCH ULTRA TEST) test strip Use for blood sugar testing 1-3 times daily 100 each 12   HYDROcodone-acetaminophen (NORCO/VICODIN) 5-325 MG tablet Take half a tablet at night  as needed for pain. 7 tablet 0   insulin lispro (HUMALOG KWIKPEN) 100 UNIT/ML KwikPen Inject 12 Units into the skin 2 (two) times daily before a meal. 15 mL 11   Insulin Pen Needle (BD PEN NEEDLE NANO U/F) 32G X 4 MM MISC USE AS DIRECTED WITH INSULIN 4  TIMES DAILY 400 each 1   Insulin Pen Needle 31G X 4 MM MISC 1 each by Does not apply route daily. 100 each 12   Lancets MISC Use with blood sugar monitor to check daily 100 each 11   LANTUS SOLOSTAR 100 UNIT/ML Solostar Pen INJECT SUBCUTANEOUSLY 40 UNITS  DAILY 45 mL 2   levothyroxine (SYNTHROID) 88 MCG tablet Take 1 tablet (88 mcg total) by mouth daily at 6 (six) AM. 30 tablet 0   losartan (COZAAR) 25 MG tablet TAKE 1 TABLET BY MOUTH DAILY 100 tablet 2   methocarbamol (ROBAXIN) 500 MG tablet Take 500 mg by mouth as needed.     omeprazole (PRILOSEC) 20 MG capsule TAKE  1 CAPSULE BY MOUTH  TWICE DAILY 180 capsule 3   polyethylene glycol powder (GLYCOLAX/MIRALAX) powder TAKE 17 GRAMS BY MOUTH 2  TIMES DAILY AS NEEDED FOR  LAXATIVE 1054 g 0   PRODIGY NO CODING BLOOD GLUC test strip USE THREE TIMES A DAY TO CHECK ON BLOOD GLUCOSE LEVEL 100 strip 12   rivaroxaban (XARELTO) 20 MG TABS tablet TAKE 1 TABLET BY MOUTH DAILY WITH SUPPER 90 tablet 1   rosuvastatin (CRESTOR) 20 MG tablet Take 1 tablet (20 mg total) by mouth daily. 90 tablet 3   Spacer/Aero-Holding Chambers (AEROCHAMBER PLUS) inhaler Use as instructed 1 each 2   SYMBICORT 80-4.5 MCG/ACT inhaler INHALE 2 PUFFS BY MOUTH INTO THE LUNGS TWICE A DAY 30.6 each 1   traMADol (ULTRAM) 50 MG tablet Take 50 mg by mouth as needed.     Travoprost, BAK Free, (TRAVATAN) 0.004 % SOLN ophthalmic solution Place 1 drop into both eyes at bedtime. 1 Bottle 5   acetaminophen (TYLENOL) 325 MG tablet Take 2 tablets (650 mg total) by mouth every 6 (six) hours as needed. (Patient not taking: Reported on 12/05/2022) 30 tablet 0   albuterol (VENTOLIN HFA) 108 (90 Base) MCG/ACT inhaler INHALE 2 PUFFS EVERY 4 HOURS AS NEEDED FOR WHEEZING (Patient not taking: Reported on 12/05/2022) 51 each 1   guaifenesin (ROBITUSSIN) 100 MG/5ML syrup TAKE 10 ML BY MOUTH 4 TIMES A DAY AS NEEDED FOR COUGH (Patient not taking: Reported on 12/05/2022) 237 mL 1   baclofen 5 MG TABS Take 1 tablet (5 mg total) by mouth 2 (two) times daily as needed for muscle spasms. (Patient not taking: Reported on 12/05/2022) 30 tablet 0   No facility-administered medications prior to visit.     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    Patient enjoys walking around her neighborhood with her 2 neighbors.    Patient does not drive, however she takes SCAT to all her medical apts.    Social Determinants of Health   Financial Resource Strain: Low Risk  (09/27/2022)   Overall Financial Resource Strain (CARDIA)    Difficulty of Paying Living Expenses: Not hard at all  Food Insecurity: No Food Insecurity (11/28/2022)   Hunger Vital Sign    Worried About Running Out of Food in the Last Year: Never true    Ran Out of Food in the Last Year: Never true  Transportation Needs: No Transportation Needs (11/28/2022)   PRAPARE - Administrator, Civil Service (Medical): No    Lack of Transportation (Non-Medical): No  Physical Activity: Insufficiently Active (09/27/2022)   Exercise Vital Sign    Days of Exercise per Week: 2 days    Minutes of Exercise per Session: 10 min  Stress: No Stress Concern Present (09/27/2022)   [employer] of Occupational Health - Occupational Stress Questionnaire    Feeling of Stress : Not at all  Social Connections: Moderately Isolated (09/27/2022)   Social Connection and Isolation Panel [NHANES]    Frequency of Communication with Friends and Family: More than three times a week    Frequency of Social Gatherings with Friends and Family: More than three times a week    Attends Religious Services: 1 to 4 times per year    Active Member of [employer] or Organizations: No    Attends Banker Meetings: Never    Marital Status: Divorced     Family History:  The patient's family  history includes Cancer in her brother; Heart failure in her mother and sister; Hypertension in her mother; Kidney disease in her daughter; Stroke in her father.   ROS:   Please see the history of present illness.    ROS All other systems reviewed and are negative.   PHYSICAL EXAM:   VS:  BP 115/67 (BP Location: Left Arm, Patient Position: Sitting, Cuff Size: Normal)   Pulse 68   Ht 5\' 2"  (1.575 m)   Wt 139 lb 3.2 oz (63.1 kg)   SpO2 95%   BMI 25.46 kg/m       General: Alert, oriented x3, no distress, healthy left subclavian pacemaker site. Head: no evidence of trauma, PERRL, EOMI, no exophtalmos or lid lag, no myxedema, no xanthelasma; normal ears, nose and oropharynx Neck: normal jugular venous pulsations and no hepatojugular reflux; brisk carotid pulses without delay and no carotid bruits Chest: clear to auscultation, no signs of consolidation by percussion or palpation, normal fremitus, symmetrical and full respiratory excursions Cardiovascular: normal position and quality of the apical impulse, regular rhythm, normal first and toxically split second heart sounds, no murmurs, rubs or gallops Abdomen: no tenderness or distention, no masses by palpation, no abnormal pulsatility or arterial bruits, normal bowel sounds, no hepatosplenomegaly Extremities: no clubbing, cyanosis or edema; 2+ radial, ulnar and brachial pulses bilaterally; 2+ right femoral, posterior tibial and dorsalis pedis pulses; 2+ left femoral, posterior tibial and dorsalis pedis pulses; no subclavian or femoral bruits Neurological: grossly nonfocal Psych: Normal mood and affect    Wt Readings from Last 3 Encounters:  12/07/22 137 lb 12.8 oz (62.5 kg)  12/05/22 139 lb 3.2 oz (63.1 kg)  11/28/22 135 lb 3.2 oz (61.3 kg)  Studies/Labs Reviewed:  Cardiac catheterization 11/20/2022:   Prox RCA lesion is 30% stenosed.   Prox LAD lesion is 40% stenosed.   1st Diag lesion is 80% stenosed.   1.  Mild to moderate  obstructive disease of the right coronary artery, proximal LAD, and first diagonal. 2.  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   Recommendations: The results were reviewed with Dr. Shari Prows and medical management will be pursued.  Echocardiogram 11/20/2022: 1. Limited Echo to evaluate for LVEF and RWMA.   2. Limited echo windows, PLAX and SAX. Doppler analysis not performed.   3. Left ventricular ejection fraction, by estimation, is 30 to 35%. The  left ventricle has moderately decreased function. RWMA assessment is  limited. RWMA is present (please see the LV wall scoring below). No apical  windows. Left ventricular diastolic  function could not be evaluated.   Comparison(s): Changes from prior study are noted. LVEF is worse now,  30-35% with RWMA.    Comprehensive pacemaker check performed in the office today.  See results above.  EKG:  EKG is not ordered today.  Personally reviewed the tracing from 11/21/2022 which shows atrial fibrillation with ventricular paced beats and bursts of nonsustained ventricular tachycardia, markedly prolonged QT interval.  Intracardiac electrogram today shows ventricular paced rhythm.  Recent Labs: 11/23/2022: TSH 0.108 11/28/2022: ALT 53; BUN 14; Creatinine, Ser 0.87; Hemoglobin 14.2; Magnesium 2.1; Platelets 375; Potassium 3.9; Sodium 142   Lipid Panel    Component Value Date/Time   CHOL 90 11/24/2022 1215   CHOL 87 (L) 02/12/2021 1159   TRIG 129 11/24/2022 1215   HDL 23 (L) 11/24/2022 1215   HDL 31 (L) 02/12/2021 1159   CHOLHDL 3.9 11/24/2022 1215   VLDL 26 11/24/2022 1215   LDLCALC 41 11/24/2022 1215   LDLCALC 35 02/12/2021 1159   LDLDIRECT 56 06/08/2012 1120     ASSESSMENT:    1. Persistent atrial fibrillation (HCC)   2. Acquired thrombophilia (HCC)   3. Chronic heart failure with preserved ejection  fraction (HCC)   4. Takotsubo cardiomyopathy   5. LV dysfunction   6. Complete heart block (HCC)   7. NSVT (nonsustained ventricular tachycardia) (HCC)   8. Pacemaker   9. Dyslipidemia (high LDL; low HDL)   10. Type 2 diabetes mellitus without complication, with long-term current use of insulin (HCC)   11. Essential hypertension   12. Chronic bronchitis, unspecified chronic bronchitis type (HCC)   13. Endocardial cushion defect      PLAN:  In order of problems listed above:  AFib: Permanent arrhythmia.  CHADSVasc 5 (age, gender, heart failure, diabetes mellitus, hypertension).  No strokes or TIAs to date. Anticoagulation: No bleeding complications. CHF: Has had substantial worsening of LVEF due to stress cardiomyopathy/Takotsubo syndrome.  Repeat an echocardiogram next month to assess for recovery. NSVT: Had frequent bursts of NSVT during the height of Takotsubo cardiomyopathy, none within the next 2 weeks. CHB: Pacemaker dependent.  She becomes very symptomatic during ventricular pacing threshold testing. PPM: Generator change July 2018.  Normal device function.  Continue remote downloads every 3 months. HLP: Excellent LDL cholesterol.  Has a chronically low HDL cholesterol at 23. DM: Not well controlled.  Most recent hemoglobin A1c was 9.1%.  On insulin and Trulicity despite the fact that she is very lean. HTN: Well-controlled COPD: Alternative explanation for her shortness of breath. History of congenital heart disease: Repeat echocardiogram  next month.  Last echocardiogram was performed in 2014.  At that time she had mild centrally directed mitral insufficiency and normal LVEF 55-60%, also had moderate tricuspid insufficiency.     Medication Adjustments/Labs and Tests Ordered: Current medicines are reviewed at length with the patient today.  Concerns regarding medicines are outlined above.  Medication changes, Labs and Tests ordered today are listed in the Patient Instructions  below. Patient Instructions  Medication Instructions:  No changes *If you need a refill on your cardiac medications before your next appointment, please call your pharmacy*  Testing/Procedures: Your physician has requested that you have a LIMITED echocardiogram. Echocardiography is a painless test that uses sound waves to create images of your heart. It provides your doctor with information about the size and shape of your heart and how well your heart's chambers and valves are working. This procedure takes approximately one hour. There are no restrictions for this procedure. Please do NOT wear cologne, perfume, aftershave, or lotions (deodorant is allowed). Please arrive 15 minutes prior to your appointment time.    Follow-Up: At [facility], you and your health needs are our priority.  As part of our continuing mission to provide you with exceptional heart care, we have created designated Provider Care Teams.  These Care Teams include your primary Cardiologist (physician) and Advanced Practice Providers (APPs -  Physician Assistants and Nurse Practitioners) who all work together to provide you with the care you need, when you need it.  We recommend signing up for the patient portal called "MyChart".  Sign up information is provided on this After Visit Summary.  MyChart is used to connect with patients for Virtual Visits (Telemedicine).  Patients are able to view lab/test results, encounter notes, upcoming appointments, etc.  Non-urgent messages can be sent to your provider as well.   To learn more about what you can do with MyChart, go to [email or web address].    Your next appointment:   1 year(s)  Provider:   Thurmon Fair, MD        Signed, Mercedes Fair, MD  12/12/2022 1:37 PM    [facility] [address] [place], [place], [place]  [address] Phone: [phone]; Fax: [phone]

## 2022-12-06 ENCOUNTER — Other Ambulatory Visit: Payer: Self-pay | Admitting: Family Medicine

## 2022-12-07 ENCOUNTER — Telehealth: Payer: Self-pay

## 2022-12-07 ENCOUNTER — Ambulatory Visit (INDEPENDENT_AMBULATORY_CARE_PROVIDER_SITE_OTHER): Payer: 59 | Admitting: Student

## 2022-12-07 VITALS — BP 139/66 | HR 78 | Ht 62.0 in | Wt 137.8 lb

## 2022-12-07 DIAGNOSIS — L989 Disorder of the skin and subcutaneous tissue, unspecified: Secondary | ICD-10-CM | POA: Diagnosis not present

## 2022-12-07 HISTORY — DX: Disorder of the skin and subcutaneous tissue, unspecified: L98.9

## 2022-12-07 NOTE — Progress Notes (Signed)
    SUBJECTIVE:   CHIEF COMPLAINT / HPI:   Skin Lesion Patient presents to skin clinic with a lesion to her left lower extremity.  She reports that this has arisen over the past 6 months or so.  It started off flat but then started to become like a mole or a wart.  It has since become drier and darker looking.  It will bleed on occasion especially when getting caught on clothing.  She has never had anything similar.    OBJECTIVE:   BP 139/66   Pulse 78   Ht 5\' 2"  (1.575 m)   Wt 137 lb 12.8 oz (62.5 kg)   SpO2 98%   BMI 25.20 kg/m   Gen: Well-appearing, NAD, in good spirits Skin: Hyperkeratotic, hyperpigmented lesion just above the medial malleolus of the RLE, lesion is loosely attached with somewhat stuck on appearance.  Easily removed bluntly with minimal bleeding at the base.     ASSESSMENT/PLAN:   Skin lesion Hyperpigmented, hyperkeratotic skin lesion.  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  Lesion was removed bluntly with minimal pain and minimal basal bleeding which was controlled with Drysol.  Will send for pathology.  If path result suggests anything concerning, would likely need referral to dermatology for excision.  Excision would be complicated by the fact that she is on Xarelto, diabetic, and that the lesion sits directly over the medial malleolus.     Eliezer Mccoy, MD [facility]

## 2022-12-07 NOTE — Assessment & Plan Note (Signed)
Hyperpigmented, hyperkeratotic skin lesion.  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  Lesion was removed bluntly with minimal pain and minimal basal bleeding which was controlled with Drysol.  Will send for pathology.  If path result suggests anything concerning, would likely need referral to dermatology for excision.  Excision would be complicated by the fact that she is on Xarelto, diabetic, and that the lesion sits directly over the medial malleolus.

## 2022-12-07 NOTE — Patient Instructions (Signed)
Ms. Culliton,  I am not exactly sure what caused that mole on your leg.  We are going to send the part that I removed to the lab for formal pathology.  If it shows that it is anything scary, we would need to send you to a dermatologist for removal given that you are on Eliquis, have diabetes, and that the area is right over your ankle bone.  We will call you with these results.  I have filled out your application for a disability parking placard.  You will need to mail this to the [facility] and pay the fee of $5 for 1 placard or $10 for 2.   J Dorothyann Gibbs, MD

## 2022-12-07 NOTE — Telephone Encounter (Signed)
Patient calls nurse line requesting a refill on Breo 100/90mcg.  She reports this was given to her in the hospital and reports she was told to call for a refill.   I do not see this medication on discharge list or medication list.   Will forward to PCP.

## 2022-12-09 ENCOUNTER — Other Ambulatory Visit: Payer: Self-pay | Admitting: Family Medicine

## 2022-12-09 MED ORDER — FLUTICASONE FUROATE-VILANTEROL 100-25 MCG/ACT IN AEPB: 1.0000 | INHALATION_SPRAY | Freq: Every day | RESPIRATORY_TRACT | 1 refills | Status: DC

## 2022-12-09 NOTE — Progress Notes (Signed)
error 

## 2022-12-09 NOTE — Telephone Encounter (Signed)
Called patient regarding request and she states that she would like the [facility] over her other inhalers as she feels it has been more effective for her breathing and was provided in the hospital.

## 2022-12-12 ENCOUNTER — Other Ambulatory Visit: Payer: Self-pay | Admitting: Family Medicine

## 2022-12-12 DIAGNOSIS — I10 Essential (primary) hypertension: Secondary | ICD-10-CM

## 2022-12-12 DIAGNOSIS — Q212 Atrioventricular septal defect, unspecified as to partial or complete: Secondary | ICD-10-CM | POA: Insufficient documentation

## 2022-12-12 DIAGNOSIS — R1013 Epigastric pain: Secondary | ICD-10-CM

## 2022-12-13 ENCOUNTER — Other Ambulatory Visit: Payer: Self-pay

## 2022-12-13 NOTE — Telephone Encounter (Signed)
Did we send in Breo?

## 2022-12-14 ENCOUNTER — Other Ambulatory Visit: Payer: Self-pay | Admitting: Family Medicine

## 2022-12-14 MED ORDER — LEVOTHYROXINE SODIUM 88 MCG PO TABS: 88.0000 ug | ORAL_TABLET | Freq: Every day | ORAL | 0 refills | Status: DC

## 2022-12-14 MED ORDER — FLUTICASONE FUROATE-VILANTEROL 100-25 MCG/ACT IN AEPB: 1.0000 | INHALATION_SPRAY | Freq: Every day | RESPIRATORY_TRACT | 1 refills | Status: DC

## 2022-12-15 ENCOUNTER — Other Ambulatory Visit: Payer: Self-pay | Admitting: *Deleted

## 2022-12-15 MED ORDER — CARVEDILOL 6.25 MG PO TABS: 6.2500 mg | ORAL_TABLET | Freq: Two times a day (BID) | ORAL | 3 refills | Status: DC

## 2022-12-19 ENCOUNTER — Telehealth: Payer: Self-pay | Admitting: Student

## 2022-12-19 NOTE — Telephone Encounter (Signed)
Called patient to discuss skin biopsy findings and recommendation for destruction of the base of her lesion.  She is amenable to having this done.  Discussed that we would want to make sure she had excellent glucose control prior to this procedure given her history of diabetes and the location of the wound on her distal leg.  She tells me that her fasting sugars have been in the 110s to 130s.  She read several of her recent glucometer readings to me over the phone, it does seem she has had generally good control recently.  Only 1 reading >200.  Expect she will be able to heal this wound.  Has PCP follow-up on May 22, will coordinate with her PCP to see if they can review her diabetic management at that visit.  Attempted to schedule her for electrodesiccation and curettage as she is anticoagulated and electrodesiccation will help to control any potential bleeding.  Unfortunately, she does not have transportation on Thursday so cannot make it for the [facility] dermatology clinic.  I have scheduled her into skin clinic with me on Wednesday, May 29, and Dr. Lum Babe will be available for precepting that day.   Eliezer Mccoy, MD

## 2022-12-23 NOTE — Progress Notes (Signed)
Remote pacemaker transmission.   

## 2022-12-28 ENCOUNTER — Ambulatory Visit (INDEPENDENT_AMBULATORY_CARE_PROVIDER_SITE_OTHER): Payer: 59 | Admitting: Family Medicine

## 2022-12-28 ENCOUNTER — Encounter: Payer: Self-pay | Admitting: Family Medicine

## 2022-12-28 ENCOUNTER — Other Ambulatory Visit (HOSPITAL_COMMUNITY): Payer: Self-pay

## 2022-12-28 VITALS — BP 113/79 | HR 62 | Ht 62.0 in | Wt 136.2 lb

## 2022-12-28 DIAGNOSIS — E039 Hypothyroidism, unspecified: Secondary | ICD-10-CM

## 2022-12-28 DIAGNOSIS — J441 Chronic obstructive pulmonary disease with (acute) exacerbation: Secondary | ICD-10-CM | POA: Diagnosis not present

## 2022-12-28 DIAGNOSIS — Z794 Long term (current) use of insulin: Secondary | ICD-10-CM | POA: Diagnosis not present

## 2022-12-28 DIAGNOSIS — E119 Type 2 diabetes mellitus without complications: Secondary | ICD-10-CM

## 2022-12-28 DIAGNOSIS — I10 Essential (primary) hypertension: Secondary | ICD-10-CM

## 2022-12-28 LAB — POCT GLYCOSYLATED HEMOGLOBIN (HGB A1C): HbA1c, POC (controlled diabetic range): 7.8 % — AB (ref 0.0–7.0)

## 2022-12-28 MED ORDER — PREDNISONE 20 MG PO TABS
40.0000 mg | ORAL_TABLET | Freq: Every day | ORAL | 0 refills | Status: AC
  Filled 2022-12-28: qty 10, 5d supply, fill #0

## 2022-12-28 MED ORDER — BUDESONIDE-FORMOTEROL FUMARATE 80-4.5 MCG/ACT IN AERO
INHALATION_SPRAY | RESPIRATORY_TRACT | 1 refills | Status: DC
  Filled 2022-12-28: qty 30.6, 90d supply, fill #0

## 2022-12-28 MED ORDER — AMOXICILLIN-POT CLAVULANATE 875-125 MG PO TABS
1.0000 | ORAL_TABLET | Freq: Two times a day (BID) | ORAL | 0 refills | Status: AC
  Filled 2022-12-28: qty 10, 5d supply, fill #0

## 2022-12-28 NOTE — Assessment & Plan Note (Addendum)
BP currently well controlled. Patient unclear of which medications she is currently taking and will bring them with her to her next appointment for clarification as several changes were made in the hospital

## 2022-12-28 NOTE — Progress Notes (Signed)
    SUBJECTIVE:   CHIEF COMPLAINT / HPI:   T2DM - Checking BG at home: yes, will have sugar drop - Medications: Lantus 40 Units, Humalog 12 Units BID. Has been doing half doses because of the drop  - Compliance: Good, has been doing half of her insulin doses due to hypoglycemic episodes in the early morning - Diet: has decreased salty and fast foods  - foot exam:  03/23/2022 - microalbumin:    Lab Results  Component Value Date   LABMICR 16.7 08/17/2022    Productive cough/Ear pain - started coughing up thick yellow phlegm - started this weekend - denies shortness of breath  - previously has taken Robitussin - Doesn't have maintenance inhaler as insurance won't cover Breo - Has her albuterol inhaler as needed  Thyroid check - meds adjusted in the hospital  - Needing TSH recheck    PERTINENT  PMH / PSH: Reviewed  OBJECTIVE:   BP 113/79   Pulse 62   Ht 5\' 2"  (1.575 m)   Wt 136 lb 4 oz (61.8 kg)   SpO2 96%   BMI 24.92 kg/m   Gen: well-appearing, NAD CV: RRR, no m/r/g appreciated, no peripheral edema Pulm: CTAB, no wheezes/crackles, has increased respiratory effort after a few deep breaths  ASSESSMENT/PLAN:   DM (diabetes mellitus), type 2 (HCC) Improved A1c to 7.8. Has been doing half of her prescribed insulin doses due to symptomatic hypoglycemic episodes. Likely due to recent diet changes/improvements and decreased appetite. Counseled patient on likely elevations with steroid use over the next few days. - Decrease Lantus to 20 Units daily (continue to decrease if hypoglycemic and call the office) - Stop PM dose of Lispro/humalog - Continue other diabetic meds as prescribed  COPD exacerbation (HCC) Patient with increased purulent sputum for the last several days. Initially denied shortness of breath, but had increased work of breathing after several deep breaths with pulmonary exam. Concerned about COPD exacerbation vs worsening of heart failure from recent  Takotsubo cardiomyopathy but given the purulent sputum, will treat as COPD.  - Augmentin 875mg  BID x5 days - Prednisone 40mg  daily x5 days - Instructed to call office if not improving by Friday or if worsening, would need to obtain CXR to evaluate for pulmonary edema  Essential hypertension BP currently well controlled. Patient unclear of which medications she is currently taking and will bring them with her to her next appointment for clarification as several changes were made in the hospital  Hypothyroidism Currently on levothyroxine 88 mcg, which was adjusted during hospitalization last month. - TSH rechecked today     Evelena Leyden, DO  [facility]

## 2022-12-28 NOTE — Assessment & Plan Note (Signed)
Patient with increased purulent sputum for the last several days. Initially denied shortness of breath, but had increased work of breathing after several deep breaths with pulmonary exam. Concerned about COPD exacerbation vs worsening of heart failure from recent Takotsubo cardiomyopathy but given the purulent sputum, will treat as COPD.  - Augmentin 875mg  BID x5 days - Prednisone 40mg  daily x5 days - Instructed to call office if not improving by Friday or if worsening, would need to obtain CXR to evaluate for pulmonary edema

## 2022-12-28 NOTE — Assessment & Plan Note (Signed)
>>  ASSESSMENT AND PLAN FOR COPD EXACERBATION (HCC) WRITTEN ON 12/28/2022 10:54 AM BY Bud Care, DO  Patient with increased purulent sputum for the last several days. Initially denied shortness of breath, but had increased work of breathing after several deep breaths with pulmonary exam. Concerned about COPD exacerbation vs worsening of heart failure from recent Takotsubo cardiomyopathy but given the purulent sputum, will treat as COPD.  - Augmentin  875mg  BID x5 days - Prednisone  40mg  daily x5 days - Instructed to call office if not improving by Friday or if worsening, would need to obtain CXR to evaluate for pulmonary edema

## 2022-12-28 NOTE — Assessment & Plan Note (Signed)
Currently on levothyroxine 88 mcg, which was adjusted during hospitalization last month. - TSH rechecked today

## 2022-12-28 NOTE — Patient Instructions (Addendum)
Today we discussed the following:  Diabetes: - Your A1c is 7.8! Much better than we have had in the last few years - You can continue doing the Lantus at 20 Units daily if your sugars are not higher than 150 in the mornings - STOP the night time dose of your Lispro/Humalog  Cough - I am concerned about a possible mild COPD exacerbation - I am sending in antibiotics and steroids (the steroids can make your sugars a bit higher over the next few days) - If you are not having any improvement in the next 2 days (by Friday) I want you to call and let me know because we will need to get a chest x-ray - You can still take the robitussin if you would like - Continue your inhalers, I will send in a refill of the one you were on before - Go to the ER if significantly short of breath or if you start having swelling in your legs and stomach  Labs - I am checking your thyroid and kidney function today

## 2022-12-28 NOTE — Assessment & Plan Note (Addendum)
Improved A1c to 7.8. Has been doing half of her prescribed insulin doses due to symptomatic hypoglycemic episodes. Likely due to recent diet changes/improvements and decreased appetite. Counseled patient on likely elevations with steroid use over the next few days. - Decrease Lantus to 20 Units daily (continue to decrease if hypoglycemic and call the office) - Stop PM dose of Lispro/humalog - Continue other diabetic meds as prescribed

## 2022-12-29 ENCOUNTER — Telehealth: Payer: Self-pay

## 2022-12-29 LAB — BASIC METABOLIC PANEL
BUN/Creatinine Ratio: 9 — ABNORMAL LOW (ref 12–28)
BUN: 8 mg/dL (ref 8–27)
CO2: 24 mmol/L (ref 20–29)
Calcium: 9.3 mg/dL (ref 8.7–10.3)
Chloride: 101 mmol/L (ref 96–106)
Creatinine, Ser: 0.87 mg/dL (ref 0.57–1.00)
Glucose: 222 mg/dL — ABNORMAL HIGH (ref 70–99)
Potassium: 4.2 mmol/L (ref 3.5–5.2)
Sodium: 141 mmol/L (ref 134–144)
eGFR: 69 mL/min/{1.73_m2} (ref >59)

## 2022-12-29 LAB — TSH RFX ON ABNORMAL TO FREE T4: TSH: 3.24 u[IU]/mL (ref 0.450–4.500)

## 2022-12-29 NOTE — Telephone Encounter (Signed)
Patient LVM on nurse line regarding medications prescribed at office visit yesterday.   She reports that pharmacy never received prescriptions.   Per chart review these medications were sent to the [facility] pharmacy.   Attempted to call patient back. She did not answer, LVM asking patient to return call to office.   Veronda Prude, RN

## 2022-12-31 ENCOUNTER — Other Ambulatory Visit (HOSPITAL_COMMUNITY): Payer: Self-pay

## 2023-01-02 ENCOUNTER — Other Ambulatory Visit: Payer: Self-pay

## 2023-01-03 ENCOUNTER — Other Ambulatory Visit: Payer: Self-pay

## 2023-01-03 ENCOUNTER — Other Ambulatory Visit (HOSPITAL_COMMUNITY): Payer: Self-pay

## 2023-01-04 ENCOUNTER — Ambulatory Visit (HOSPITAL_COMMUNITY): Payer: 59 | Attending: Cardiovascular Disease

## 2023-01-04 ENCOUNTER — Ambulatory Visit (INDEPENDENT_AMBULATORY_CARE_PROVIDER_SITE_OTHER): Payer: 59 | Admitting: Student

## 2023-01-04 ENCOUNTER — Other Ambulatory Visit: Payer: Self-pay | Admitting: Family Medicine

## 2023-01-04 VITALS — BP 134/69 | HR 70 | Ht 62.0 in | Wt 134.6 lb

## 2023-01-04 DIAGNOSIS — I5189 Other ill-defined heart diseases: Secondary | ICD-10-CM | POA: Diagnosis not present

## 2023-01-04 DIAGNOSIS — L989 Disorder of the skin and subcutaneous tissue, unspecified: Secondary | ICD-10-CM | POA: Diagnosis not present

## 2023-01-04 DIAGNOSIS — I4891 Unspecified atrial fibrillation: Secondary | ICD-10-CM | POA: Diagnosis not present

## 2023-01-04 DIAGNOSIS — J449 Chronic obstructive pulmonary disease, unspecified: Secondary | ICD-10-CM | POA: Diagnosis not present

## 2023-01-04 DIAGNOSIS — I081 Rheumatic disorders of both mitral and tricuspid valves: Secondary | ICD-10-CM | POA: Diagnosis not present

## 2023-01-04 DIAGNOSIS — I519 Heart disease, unspecified: Secondary | ICD-10-CM

## 2023-01-04 DIAGNOSIS — I119 Hypertensive heart disease without heart failure: Secondary | ICD-10-CM | POA: Insufficient documentation

## 2023-01-04 DIAGNOSIS — E119 Type 2 diabetes mellitus without complications: Secondary | ICD-10-CM | POA: Diagnosis not present

## 2023-01-04 DIAGNOSIS — Z950 Presence of cardiac pacemaker: Secondary | ICD-10-CM | POA: Diagnosis not present

## 2023-01-04 LAB — ECHOCARDIOGRAM LIMITED
Area-P 1/2: 3.08 cm2
Height: 62 in
S' Lateral: 2.8 cm
Weight: 2153.6 oz

## 2023-01-04 NOTE — Progress Notes (Signed)
    SUBJECTIVE:   CHIEF COMPLAINT / HPI:   Skin Lesion Skin lesion that was bluntly removed several weeks ago with pathology report showing "an irregular proliferation of squamous epithelium with downward extension into the dermis associated with some fibrosis. Though atypia is present, it is difficult to ascertain whether this is a pseudoepitheliomatous reaction or an evolving squamous cell carcinoma. Therapy should be directed toward complete ablation of the lesion." She is here today for complete ablation of the lesion. We discussed options including referral to dermatology for excision versus electrodesiccation and curettage in our office.  Ultimately, given the location of the lesion just over her malleolus, feel that she may have difficulty healing and extensive wound.  She is also anticoagulated due to atrial fibrillation so would favor avoiding wide excision.  Here today to attempt electrodesiccation and curettage of the lesion. Has been adjusting her insulin regimen at home for tighter glucose control to help with healing of this wound.  A1c has improved to 7.8%.  Recently had her insulin regimen reduced by her PCP on 5/22.  Since then her sugars have been doing well.  Fasting sugars between 112 and 115.  No lows.  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  Constitutional:      General: She is not in acute distress. Pulmonary:     Effort: Pulmonary effort is normal. No respiratory distress.  Musculoskeletal:        General: No swelling or deformity.  Skin:    General: Skin is warm and dry.     Comments: ~19mm lesion to the R ankle, just proximal to the medial malleolus. Similar in  appearance to my previous exam. No surrounding skin changes.   Neurological:     General: No focal deficit present.  Psychiatric:        Mood and Affect: Mood normal.      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 Time out performed. The area surrounding the skin lesion was prepared and cleaned with povidone-iodine swab stick and wiped clean with alcohol prep. The area was sufficiently anesthetized with 1% Lidocaine with epinephrine. A  curette  was used to remove the lesion. Curettage and desiccation (electrosurgery) was then performed in 3 planes of motion for a total of 3 cycles. Hemostasis was achieved prior to completion and the procedure was well tolerated without complications. The wound was left open to heal without sutures. Standard post-procedure care was explained and return precautions and wound care handout given.      Eliezer Mccoy, MD [facility]

## 2023-01-04 NOTE — Patient Instructions (Signed)
Ms. Mercedes, Barr to see you!  We burned to the base of the lesion for you.  Hopefully this will keep it from coming back.  If it does come back, you will need to come and see [place] we will probably need to send you to a dermatologist to cut it out completely.  The area may burn and blister over the next few days.  Please keep it covered up with a Band-Aid and keep some Vaseline over it.  Be well, Eliezer Mccoy, MD

## 2023-01-04 NOTE — Assessment & Plan Note (Addendum)
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 Time out performed. The area surrounding the skin lesion was prepared and cleaned with povidone-iodine swab stick and wiped clean with alcohol prep. The area was sufficiently anesthetized with 1% Lidocaine with epinephrine. A  curette  was used to remove the lesion. Curettage and desiccation (electrosurgery) was then performed in 3 planes of motion for a total of 3 cycles. Hemostasis was achieved prior to completion and the procedure was well tolerated without complications. The wound was left open to heal without sutures. Standard post-procedure care was explained and return precautions and wound care handout given.

## 2023-01-05 ENCOUNTER — Encounter: Payer: Self-pay | Admitting: Family Medicine

## 2023-01-09 ENCOUNTER — Other Ambulatory Visit: Payer: Self-pay | Admitting: Family Medicine

## 2023-01-10 ENCOUNTER — Telehealth: Payer: Self-pay
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
## 2023-01-11 ENCOUNTER — Other Ambulatory Visit: Payer: Self-pay | Admitting: Family Medicine

## 2023-01-13 ENCOUNTER — Other Ambulatory Visit (HOSPITAL_COMMUNITY): Payer: Self-pay

## 2023-01-14 ENCOUNTER — Other Ambulatory Visit: Payer: Self-pay | Admitting: Family Medicine

## 2023-01-16 ENCOUNTER — Encounter: Payer: Self-pay | Admitting: Family Medicine

## 2023-01-16 ENCOUNTER — Ambulatory Visit (INDEPENDENT_AMBULATORY_CARE_PROVIDER_SITE_OTHER): Payer: 59 | Admitting: Family Medicine

## 2023-01-16 VITALS — BP 133/89 | HR 69 | Ht 62.0 in | Wt 135.8 lb

## 2023-01-16 DIAGNOSIS — R262 Difficulty in walking, not elsewhere classified: Secondary | ICD-10-CM

## 2023-01-16 DIAGNOSIS — Z23 Encounter for immunization: Secondary | ICD-10-CM

## 2023-01-16 DIAGNOSIS — J42 Unspecified chronic bronchitis: Secondary | ICD-10-CM | POA: Diagnosis not present

## 2023-01-16 DIAGNOSIS — Z86010 Personal history of colonic polyps: Secondary | ICD-10-CM

## 2023-01-16 DIAGNOSIS — T84012D Broken internal right knee prosthesis, subsequent encounter: Secondary | ICD-10-CM

## 2023-01-16 MED ORDER — ZOSTER VAC RECOMB ADJUVANTED 50 MCG/0.5ML IM SUSR
0.5000 mL | Freq: Once | INTRAMUSCULAR | 0 refills | Status: AC
Start: 2023-01-16 — End: 2023-01-16
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
## 2023-01-16 NOTE — Assessment & Plan Note (Addendum)
Last colonoscopy completed 03/01/2022 with recommendations to repeat in 5 years for surveillance from pathology results. - Healthcare Maintenance tab updated for repeat in 2028

## 2023-01-16 NOTE — Assessment & Plan Note (Signed)
Has baseline productive cough at this time.  Currently on Trelegy.  Discussed with patient that she would likely need to see pulmonology if she was wanting to discuss other medications available for her.

## 2023-01-16 NOTE — Telephone Encounter (Signed)
Community message sent to [employer] for Rollator. Will await response.  ? ?Kristiane Morsch C Cinzia Devos, RN ? ?

## 2023-01-16 NOTE — Patient Instructions (Addendum)
I have placed the order to get your rollator, our nursing will be in contact with your insurance to try and help you get that coordinated, but I have also printed a prescription that you can take to a medical supply store and they should be able to help you.  Regarding your nursing visit we talked about the following: - Your kidney function was checked at the last visit and overall looks good - Your creatinine level was normal - Your cholesterol was checked in April and we need to work on increasing your "good" cholesterol (also called HDL)     What should we aim for? HDL of 60mg /dL and above is considered protective against heart disease.  Physical Activity:  Every bit counts: taking the stairs, walking, biking, and weight training.  You do not have to be an Olympic athlete, but you need to be active most days of the week.   Omega 3 fats: Fish:   Eating 2-3 fish meals per week has shown to increase HDL cholesterol. This is linked to the Omega 3 Fatty Acids. Fatty fish are best Herring Sardines Tuna(light) Sea [employer]  Other Sources Ground Flax [employer] Dark green leafy vegetables Flaxseed oil Cereals, eggs adn margarine added with omega 3 fats Walnuts  Eat Less Sugar and Refined (white) Carbohydrates (lowers HDL Cholesterol) Sugar in any from (white, brown, honey, corn syrup) Foods made with white (enriched)  flour: saltines, crackers, bread, pasta, pretzels, cake, cookies, etc. Candy Regular soda Potatoes Fruit juices Rice   Eat Fiber rich Foods instead: Whole or cut fruit, vegetables, salads, legumes, whole grains  Avoid Trans-fatty Acids: Avoid foods that have partially hydrogenated oils. Read the ingredients list to find them. These oils raise LDL cholesterol and lower HDL cholesterol.  Alcohol:  Particularly wine has been shown to increase HDL. Drink in moderation (up to one for women and two for men in a day). Do not drink if you have  diabetes, high triglycerides or fatty liver, to increase HDL cholesterol.
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
## 2023-01-18 ENCOUNTER — Ambulatory Visit (INDEPENDENT_AMBULATORY_CARE_PROVIDER_SITE_OTHER): Payer: 59 | Admitting: Podiatry

## 2023-01-18 ENCOUNTER — Encounter: Payer: Self-pay | Admitting: Podiatry

## 2023-01-18 DIAGNOSIS — B351 Tinea unguium: Secondary | ICD-10-CM

## 2023-01-18 DIAGNOSIS — E119 Type 2 diabetes mellitus without complications: Secondary | ICD-10-CM | POA: Diagnosis not present

## 2023-01-18 DIAGNOSIS — M79674 Pain in right toe(s): Secondary | ICD-10-CM

## 2023-01-18 DIAGNOSIS — M79675 Pain in left toe(s): Secondary | ICD-10-CM

## 2023-01-18 DIAGNOSIS — Z794 Long term (current) use of insulin: Secondary | ICD-10-CM | POA: Diagnosis not present

## 2023-01-18 NOTE — Progress Notes (Signed)
This patient returns to my office for at risk foot care.  This patient requires this care by a professional since this patient will be at risk due to having type 2 diabetes.  This patient is unable to cut nails herself since the patient cannot reach her nails.These nails are painful walking and wearing shoes.  This patient presents for at risk foot care today.  General Appearance  Alert, conversant and in no acute stress.  Vascular  Dorsalis pedis and posterior tibial  pulses are palpable  bilaterally.  Capillary return is within normal limits  bilaterally. Temperature is within normal limits  bilaterally.  Neurologic  Senn-Weinstein monofilament wire test within normal limits  bilaterally. Muscle power within normal limits bilaterally.  Nails Thick disfigured discolored nails with subungual debris  from hallux to fifth toes bilaterally. No evidence of bacterial infection or drainage bilaterally.  Orthopedic  No limitations of motion  feet .  No crepitus or effusions noted.  No bony pathology or digital deformities noted.  Midfoot arthritis.  Skin  normotropic skin with no porokeratosis noted bilaterally.  No signs of infections or ulcers noted.     Onychomycosis  Pain in right toes  Pain in left toes  Consent was obtained for treatment procedures.   Mechanical debridement of nails 1-5  bilaterally performed with a nail nipper.  Filed with dremel without incident.     Return office visit    3 months                  Told patient to return for periodic foot care and evaluation due to potential at risk complications.   Birdell Frasier DPM   

## 2023-01-27 LAB — HM DIABETES EYE EXAM

## 2023-02-15 ENCOUNTER — Ambulatory Visit (INDEPENDENT_AMBULATORY_CARE_PROVIDER_SITE_OTHER): Payer: 59

## 2023-02-15 DIAGNOSIS — I442 Atrioventricular block, complete: Secondary | ICD-10-CM

## 2023-02-15 LAB — CUP PACEART REMOTE DEVICE CHECK
Battery Impedance: 530 Ohm
Battery Remaining Longevity: 92 mo
Battery Voltage: 2.77 V
Brady Statistic RV Percent Paced: 99 %
Date Time Interrogation Session: 20240710071911
Implantable Lead Connection Status: 753985
Implantable Lead Connection Status: 753985
Implantable Lead Implant Date: 19990203
Implantable Lead Implant Date: 19990203
Implantable Lead Location: 753859
Implantable Lead Location: 753860
Implantable Lead Model: 5068
Implantable Lead Model: 5092
Implantable Pulse Generator Implant Date: 20180711
Lead Channel Impedance Value: 623 Ohm
Lead Channel Impedance Value: 67 Ohm
Lead Channel Pacing Threshold Amplitude: 1 V
Lead Channel Pacing Threshold Pulse Width: 0.4 ms
Lead Channel Setting Pacing Amplitude: 2.5 V
Lead Channel Setting Pacing Pulse Width: 0.4 ms
Lead Channel Setting Sensing Sensitivity: 4 mV
Zone Setting Status: 755011
Zone Setting Status: 755011

## 2023-02-17 ENCOUNTER — Telehealth: Payer: Self-pay

## 2023-02-17 DIAGNOSIS — J42 Unspecified chronic bronchitis: Secondary | ICD-10-CM

## 2023-02-17 NOTE — Telephone Encounter (Signed)
Patient calls nurse line requesting refills.   She reports she needs a new nebulizer machine and the solution.   She reports she spoke with [facility] and they are able to supply a nebulizer machine with a prescription.   Of note, I do not see solution on medication list.   Will forward to PCP.   Please send machine and solution to [facility].

## 2023-02-18 MED ORDER — ALBUTEROL SULFATE (2.5 MG/3ML) 0.083% IN NEBU: 2.5000 mg | INHALATION_SOLUTION | RESPIRATORY_TRACT | 1 refills | Status: DC | PRN

## 2023-02-18 NOTE — Telephone Encounter (Signed)
Sent order for nebulizer DME and albuterol solution to [facility].  If patient is experiencing consistent SOB requiring multiple doses of albuterol, I would recommend she be seen for evaluation.

## 2023-03-04 ENCOUNTER — Other Ambulatory Visit: Payer: Self-pay | Admitting: Student

## 2023-03-04 DIAGNOSIS — I10 Essential (primary) hypertension: Secondary | ICD-10-CM

## 2023-03-08 NOTE — Progress Notes (Signed)
Remote pacemaker transmission.   

## 2023-03-17 ENCOUNTER — Other Ambulatory Visit: Payer: Self-pay

## 2023-03-17 MED ORDER — TRULICITY 0.75 MG/0.5ML ~~LOC~~ SOAJ: 0.7500 mg | SUBCUTANEOUS | 2 refills | Status: DC

## 2023-04-06 ENCOUNTER — Other Ambulatory Visit: Payer: Self-pay | Admitting: Family Medicine

## 2023-04-06 DIAGNOSIS — I4819 Other persistent atrial fibrillation: Secondary | ICD-10-CM

## 2023-04-08 ENCOUNTER — Encounter (HOSPITAL_COMMUNITY): Payer: Self-pay

## 2023-04-08 ENCOUNTER — Ambulatory Visit (INDEPENDENT_AMBULATORY_CARE_PROVIDER_SITE_OTHER): Payer: 59

## 2023-04-08 ENCOUNTER — Ambulatory Visit (HOSPITAL_COMMUNITY)
Admission: EM | Admit: 2023-04-08 | Discharge: 2023-04-08 | Disposition: A | Discharge status: Home / Self Care | Payer: 59 | Attending: Emergency Medicine | Admitting: Emergency Medicine

## 2023-04-08 DIAGNOSIS — R051 Acute cough: Secondary | ICD-10-CM

## 2023-04-08 DIAGNOSIS — J441 Chronic obstructive pulmonary disease with (acute) exacerbation: Secondary | ICD-10-CM | POA: Diagnosis not present

## 2023-04-08 MED ORDER — GUAIFENESIN 400 MG PO TABS: 400.0000 mg | ORAL_TABLET | Freq: Three times a day (TID) | ORAL | 0 refills | Status: AC

## 2023-04-08 NOTE — Discharge Instructions (Addendum)
Use nebulizer 3 times a day, STOP Robitussin cough syrup and take the Guaifenesin tablets ordered Follow-up with primary care provider for COPD management and review appropriate inhalers.

## 2023-04-08 NOTE — ED Provider Notes (Signed)
[facility]    CSN: [id number] Arrival date & time: 04/08/23  1006      History   Chief Complaint Chief Complaint  Patient presents with   Cough    HPI Mercedes Barr is a 77 y.o. female.    Cough Cough characteristics:  Productive, nocturnal and hacking Sputum characteristics:  Yellow Severity:  Moderate Onset quality:  Gradual Duration:  1 week Timing:  Constant Progression:  Unchanged Chronicity:  Recurrent Relieved by:  Nothing Ineffective treatments:  Cough suppressants Associated symptoms: shortness of breath     Past Medical History:  Diagnosis Date   Arthritis 11/06/2012   Tr TKR   Asthma    Chronic heart failure with preserved ejection fraction (HFpEF) (HCC)    Complete heart block (HCC)    Congenital heart disease    atrioventricular cushion defect repair age 37 (69)   COPD (chronic obstructive pulmonary disease) (HCC)    Diabetes mellitus    GERD (gastroesophageal reflux disease)    Heart murmur    Hypertension    Hypothyroidism    Longstanding persistent atrial fibrillation (HCC)    Normal coronary arteries 08/08/2009   Pacemaker    PACEMAKER DEPENDENT-[facility]U [facility].  OFFICE NOTE DR. A. LITTLE STATES  "EXTREMELY PACEMAKER SENSITIVE AND WHEN YOU TRY TO CHECK FOR UNDERLYING RHYTHMS SHE WILL HAVE SNYCOPE AND WE HAVE NOT DONE THIS NOW IN ABOUT 2 YEARS".   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   Arthritis, midfoot 07/06/2022   Osteoporosis 04/03/2019   Epigastric pain 01/31/2019   Hematuria  12/27/2018   Gross hematuria 12/13/2018   Persistent atrial fibrillation (HCC) 08/17/2017   Tremor of left hand 06/12/2015   (HFpEF) heart failure with preserved ejection fraction (HCC) 05/21/2015   Difficulty walking 04/27/2015   Osteoarthritis, multiple sites 11/20/2014   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   Complete heart block (HCC)    Normal coronary arteries-Feb 2011    Pacemaker    Weight loss, unintentional 12/21/2012   Osteoarthritis of right knee 08/22/2012   Insomnia 03/08/2012   Allergy to bee sting 10/04/2011   Low back pain 09/07/2011   Hypothyroidism 12/28/2009   GLAUCOMA 12/25/2009   GERD 01/19/2009   COPD (chronic obstructive pulmonary disease) (HCC) 11/06/2008   Abnormal involuntary movement 09/03/2008   Essential hypertension 06/14/2007   DM (diabetes mellitus), type 2 (HCC) 11/06/2006   DEPRESSION 11/06/2006   Dyslipidemia (high LDL; low HDL) 11/03/2006    Past Surgical History:  Procedure Laterality Date   ATRIOVENTRICULAR CUSHION DEFECT REPAIR  1988   CARDIAC CATHETERIZATION  2011    SHOWED NO CAD-PER CARDIOLOGY OFFICE NOTES DR. A. LITTLE   CHOLECYSTECTOMY N/A 11/19/2022   Procedure: LAPAROSCOPIC CHOLECYSTECTOMY;  Surgeon: Andria Meuse, MD;  Location: [facility];  Service: General;  Laterality: N/A;   colonscopy      removed polyps    INSERT / REPLACE / REMOVE PACEMAKER     LAPAROSCOPIC LYSIS OF ADHESIONS  11/19/2022   Procedure: LAPAROSCOPIC LYSIS OF ADHESIONS;  Surgeon: Andria Meuse, MD;  Location:  [facility];  Service: General;;   LEFT HEART CATH AND CORONARY ANGIOGRAPHY N/A 11/20/2022   Procedure: LEFT HEART CATH AND CORONARY ANGIOGRAPHY;  Surgeon: Orbie Pyo, MD;  [facility]: [facility];  Service: Cardiovascular;  Laterality: N/A;   PACEMAKER INSERTION  1988   last gen 11/08- MDT   PPM GENERATOR CHANGEOUT N/A 02/15/2017   Procedure: PPM Generator Changeout;  Surgeon: Thurmon Fair, MD;  [facility]: [facility];  Service: Cardiovascular;  Laterality: N/A;   TOTAL KNEE ARTHROPLASTY Right 11/19/2012   Procedure: RIGHT TOTAL KNEE ARTHROPLASTY;  Surgeon: Loanne Drilling, MD;  Location: [facility];  Service: Orthopedics;  Laterality: Right;   TOTAL KNEE REVISION Right 09/24/2014   Procedure: RIGHT TOTAL KNEE ARTHROPLASTY REVISION;  Surgeon: Loanne Drilling, MD;  Location: [facility];  Service: Orthopedics;  Laterality: Right;   TUBAL LIGATION      OB History   No obstetric history on file.      Home Medications    Prior to Admission medications   Medication Sig Start Date End Date Taking? Authorizing Provider  albuterol (PROVENTIL) (2.5 MG/3ML) 0.083% nebulizer solution Take 3 mLs (2.5 mg total) by nebulization every 4 (four) hours as needed for wheezing or shortness of breath. 02/18/23  Yes Mabe, Earvin Hansen, MD  albuterol (VENTOLIN HFA) 108 (90 Base) MCG/ACT inhaler INHALE 2 PUFFS EVERY 4 HOURS AS NEEDED FOR WHEEZING 08/27/21  Yes Lilland, Alana, DO  Baclofen 5 MG TABS TAKE 1 TABLET BY MOUTH 2 TIMES DAILY AS NEEDED FOR MUSCLE SPASMS. 01/05/23  Yes Lilland, Alana, DO  Blood Glucose Monitoring Suppl (ONE TOUCH ULTRA 2) w/Device KIT Use to check blood sugar as directed. 08/31/18  Yes Westley Chandler, MD  budesonide-formoterol (SYMBICORT) 80-4.5 MCG/ACT inhaler INHALE 2 PUFFS BY MOUTH INTO THE LUNGS TWICE A DAY 12/28/22  Yes Lilland, Alana, DO  carvedilol (COREG) 6.25 MG tablet Take 1 tablet (6.25 mg total) by mouth 2 (two) times daily with a meal. 12/15/22  Yes Lilland, Alana, DO  cetirizine (ZYRTEC ALLERGY) 10 MG tablet Take 1 tablet (10 mg total) by mouth daily. 05/10/22  Yes Lilland, Alana, DO  citalopram (CELEXA) 20 MG tablet TAKE 1 TABLET BY MOUTH IN THE  MORNING 09/08/22  Yes Lilland, Alana, DO  diclofenac Sodium (VOLTAREN) 1 % GEL APPLY 2 GRAMS TO AFFECTED AREA 4 TIMES A DAY 01/09/23  Yes Lilland, Alana, DO  Dulaglutide (TRULICITY) 0.75 MG/0.5ML SOPN Inject 0.75 mg into the skin once a week. 03/17/23  Yes Mabe, Earvin Hansen,  MD  empagliflozin (JARDIANCE) 25 MG TABS tablet Take 1 tablet (25 mg total) by mouth daily. 11/28/22  Yes Valetta Close, MD  EPIPEN 2-PAK 0.3 MG/0.3ML SOAJ injection Inject 0.3 mLs (0.3 mg  total) into the muscle as  needed for severe allergic  reaction, and contact  medical provider. 10/27/15  Yes Jamal Collin, MD  famotidine (PEPCID) 20 MG tablet TAKE 1 TABLET BY MOUTH TWICE A DAY 12/13/22  Yes Lilland, Alana, DO  furosemide (LASIX) 20 MG tablet Take 2 tablets (40 mg total) by mouth daily as needed. 11/25/22  Yes Mabe, Earvin Hansen, MD  glucose blood (ONE TOUCH ULTRA TEST) test strip Use for blood sugar testing 1-3 times daily 08/15/19  Yes Mirian Mo, MD  guaifenesin (HUMIBID E) 400 MG TABS tablet Take 1 tablet (400 mg total) by mouth 3 (three) times daily for 28 days. 04/08/23 05/06/23 Yes Meade Hogeland, Linde Gillis, NP  insulin lispro (HUMALOG KWIKPEN) 100 UNIT/ML KwikPen Inject  12 Units into the skin 2 (two) times daily before a meal. 09/30/22  Yes Lilland, Alana, DO  Insulin Pen Needle (BD PEN NEEDLE NANO U/F) 32G X 4 MM MISC USE AS DIRECTED WITH INSULIN 4  TIMES DAILY 09/21/22  Yes Lilland, Alana, DO  Insulin Pen Needle 31G X 4 MM MISC 1 each by Does not apply route daily. 01/11/22  Yes Lilland, Alana, DO  Lancets MISC Use with blood sugar monitor to check daily 07/07/21  Yes Westley Chandler, MD  LANTUS SOLOSTAR 100 UNIT/ML Solostar Pen INJECT SUBCUTANEOUSLY 40 UNITS  DAILY 08/15/22  Yes Lilland, Alana, DO  levothyroxine (SYNTHROID) 88 MCG tablet TAKE 1 TABLET (88 MCG TOTAL) BY MOUTH DAILY AT 6 (SIX) AM. 01/12/23  Yes Lilland, Alana, DO  losartan (COZAAR) 25 MG tablet TAKE 1 TABLET BY MOUTH DAILY 10/07/22  Yes Lilland, Alana, DO  omeprazole (PRILOSEC) 20 MG capsule TAKE 1 CAPSULE BY MOUTH  TWICE DAILY 02/01/21  Yes Mirian Mo, MD  polyethylene glycol powder (GLYCOLAX/MIRALAX) powder TAKE 17 GRAMS BY MOUTH 2  TIMES DAILY AS NEEDED FOR  LAXATIVE 10/23/17  Yes Diallo, Abdoulaye, MD  PRODIGY NO CODING BLOOD GLUC test  strip USE THREE TIMES A DAY TO CHECK ON BLOOD GLUCOSE LEVEL 10/02/20  Yes Mirian Mo, MD  rosuvastatin (CRESTOR) 20 MG tablet Take 1 tablet (20 mg total) by mouth daily. 11/28/22  Yes Valetta Close, MD  Spacer/Aero-Holding Chambers (AEROCHAMBER PLUS) inhaler Use as instructed 06/08/18  Yes Domenick Gong, MD  Travoprost, BAK Free, (TRAVATAN) 0.004 % SOLN ophthalmic solution Place 1 drop into both eyes at bedtime. 09/15/14  Yes Leona Singleton, MD  XARELTO 20 MG TABS tablet TAKE 1 TABLET BY MOUTH DAILY WITH SUPPER 04/06/23  Yes Mabe, Earvin Hansen, MD  acetaminophen (TYLENOL) 325 MG tablet Take 2 tablets (650 mg total) by mouth every 6 (six) hours as needed. 11/25/22   Evette Georges, MD  HYDROcodone-acetaminophen (NORCO/VICODIN) 5-325 MG tablet Take half a tablet at night as needed for pain. 11/04/22   Alfredo Martinez, MD  traMADol (ULTRAM) 50 MG tablet Take 50 mg by mouth as needed. 09/21/21   [provider]  albuterol (PROVENTIL,VENTOLIN) 90 MCG/ACT inhaler Inhale 2 puffs into the lungs every 4 (four) hours as needed for wheezing or shortness of breath. 05/30/11 10/21/11  Ardyth Gal, MD    Family History Family History  Problem Relation Age of Onset   Heart failure Mother    Hypertension Mother    Stroke Father    Heart failure Sister    Kidney disease Daughter    Cancer Brother    Breast cancer Neg Hx     Social History Social History   Tobacco Use   Smoking status: Never    Passive exposure: Never   Smokeless tobacco: Never  Vaping Use   Vaping status: Never Used  Substance Use Topics   Alcohol use: No   Drug use: No     Allergies   Chlorhexidine, Naproxen, Pregabalin, Statins, Oxycodone, Amitriptyline hcl, Benazepril hcl, Gabapentin, Oxycodone-acetaminophen, and Propoxyphene   Review of Systems Review of Systems  Respiratory:  Positive for cough and shortness of breath.   All other systems reviewed and are negative.    Physical Exam Triage Vital  Signs ED Triage Vitals [04/08/23 1022]  Encounter Vitals Group     BP 137/80     Systolic BP Percentile      Diastolic BP Percentile      Pulse Rate 85  Resp 16     Temp 98.4 F (36.9 C)     Temp Source Oral     SpO2 94 %     Weight      Height      Head Circumference      Peak Flow      Pain Score      Pain Loc      Pain Education      Exclude from Growth Chart    No data found.  Updated Vital Signs BP 137/80 (BP Location: Left Arm)   Pulse 85   Temp 98.4 F (36.9 C) (Oral)   Resp 16   SpO2 94%   Visual Acuity Right Eye Distance:   Left Eye Distance:   Bilateral Distance:    Right Eye Near:   Left Eye Near:    Bilateral Near:     Physical Exam Vitals and nursing note reviewed.  Constitutional:      Appearance: Normal appearance.  Cardiovascular:     Rate and Rhythm: Normal rate and regular rhythm.  Pulmonary:     Effort: Pulmonary effort is normal.     Comments: Diminished bilateral lower lobe sounds Abdominal:     General: Abdomen is flat.     Palpations: Abdomen is soft.  Skin:    General: Skin is warm.  Neurological:     Mental Status: She is alert.      UC Treatments / Results  Labs (all labs ordered are listed, but only abnormal results are displayed) Labs Reviewed - No data to display  EKG   Radiology DG Chest 2 View  Result Date: 04/08/2023 CLINICAL DATA:  Productive cough. EXAM: CHEST - 2 VIEW COMPARISON:  Chest radiographs 11/18/2022, 08/10/2022, and 01/07/2011 FINDINGS: Left chest wall pacing generator with leads overlying the right atrium and right ventricle. Additional disconnected leads extending into the anterior right hemithorax, possibly from an abandoned pacer, unchanged from 01/07/2011 radiograph. The superolateral right hemithorax and level of the diaphragms, similar to prior. The lungs are clear. No pleural effusion or pneumothorax. Right upper quadrant cholecystectomy clips. Mild-to-moderate multilevel degenerative disc  changes of the thoracic spine. IMPRESSION: No acute cardiopulmonary process. Electronically Signed   By: Neita Garnet M.D.   On: 04/08/2023 12:00    Procedures Procedures (including critical care time)  Medications Ordered in UC Medications - No data to display  Initial Impression / Assessment and Plan / UC Course  I have reviewed the triage vital signs and the nursing notes.  Pertinent labs & imaging results that were available during my care of the patient were reviewed by me and considered in my medical decision making (see chart for details).   Patient has a history of COPD.  She is currently using her home inhaler but she is not using her nebulizer solution due to her nebulizer being broken.  Patient reports that she has been taking Robitussin cough without effectiveness.  Sputum is seen and noted to be yellow very thick and tenacious.  On auscultation of the lungs diminished bilateral lower lobes noted.  We will do a chest x-ray to rule out pneumonia as she has been reporting chills and sweating intermittently along with a unrelenting cough. COPD versus pneumonia. Chest x-ray was negative.   Final Clinical Impressions(s) / UC Diagnoses   Final diagnoses:  COPD exacerbation (HCC)  Acute cough     Discharge Instructions      Use nebulizer 3 times a day, STOP Robitussin cough syrup and  take the Guaifenesin tablets ordered Follow-up with primary care provider for COPD management and review appropriate inhalers.     ED Prescriptions     Medication Sig Dispense Auth. Provider   guaifenesin (HUMIBID E) 400 MG TABS tablet Take 1 tablet (400 mg total) by mouth 3 (three) times daily for 28 days. 84 tablet Isabell Bonafede, Linde Gillis, NP      PDMP not reviewed this encounter.   Nelda Marseille, NP 04/08/23 [phone]

## 2023-04-08 NOTE — ED Triage Notes (Signed)
Pt presents to the office for cough and congestion x 1 weeks. Pt last dose of albuterol was last night.

## 2023-04-13 ENCOUNTER — Other Ambulatory Visit: Payer: Self-pay | Admitting: Family Medicine

## 2023-04-13 ENCOUNTER — Other Ambulatory Visit: Payer: Self-pay

## 2023-04-13 MED ORDER — CARVEDILOL 6.25 MG PO TABS: 6.2500 mg | ORAL_TABLET | Freq: Two times a day (BID) | ORAL | 3 refills | Status: DC

## 2023-04-17 ENCOUNTER — Encounter: Payer: Self-pay | Admitting: Student

## 2023-04-17 ENCOUNTER — Ambulatory Visit (INDEPENDENT_AMBULATORY_CARE_PROVIDER_SITE_OTHER): Payer: 59 | Admitting: Student

## 2023-04-17 VITALS — BP 149/54 | HR 69 | Ht 62.0 in | Wt 130.8 lb

## 2023-04-17 DIAGNOSIS — J441 Chronic obstructive pulmonary disease with (acute) exacerbation: Secondary | ICD-10-CM

## 2023-04-17 DIAGNOSIS — J42 Unspecified chronic bronchitis: Secondary | ICD-10-CM

## 2023-04-17 MED ORDER — PREDNISONE 20 MG PO TABS: 40.0000 mg | ORAL_TABLET | Freq: Every day | ORAL | 0 refills | Status: DC

## 2023-04-17 MED ORDER — CARVEDILOL 6.25 MG PO TABS: 6.2500 mg | ORAL_TABLET | Freq: Two times a day (BID) | ORAL | 3 refills | Status: DC

## 2023-04-17 MED ORDER — AZITHROMYCIN 500 MG PO TABS: 500.0000 mg | ORAL_TABLET | Freq: Every day | ORAL | 0 refills | Status: AC

## 2023-04-17 NOTE — Patient Instructions (Addendum)
Start taking prednisone once a day for 5 days. Start taking azithromycin once a day for 3 days.  Continue using your Symbicort twice per day  I placed a referral to pulmonology (lung doctors) for your COPD.  They should call you to schedule within the next 2 weeks.  If you do not hear from them by them, please let us know  Please schedule an appointment at the front desk to follow-up with [place] in 1 weeks we can make sure that you are getting better   Have a wonderful day,  Dr. Darral Dash [facility] [phone]

## 2023-04-17 NOTE — Progress Notes (Signed)
    SUBJECTIVE:   CHIEF COMPLAINT / HPI:   Mercedes Barr is a pleasant 77 year old female here to discuss COPD exacerbation.  COPD She was seen in urgent care on 8/13 for increase in purulent sputum with cough over 1 week, fitting with COPD exacerbation.  Chest x-ray was completed that did not show pneumonia.  She was sent home with nebulizer 3 times a day, although her nebulizer is broken.  Today, she has continued thick, yellow sputum.  Coughing a lot, not able to get much relief. No fevers or chills.  No respiratory distress.  Does not wear oxygen at baseline Using her Symbicort inhaler twice per day She is a never smoker  PERTINENT  PMH / PSH: Atrial fibrillation, HFpEF, COPD, T2DM  OBJECTIVE:   BP (!) 149/54   Pulse 69   Ht 5\' 2"  (1.575 m)   Wt 130 lb 12.8 oz (59.3 kg)   SpO2 100%   BMI 23.92 kg/m   General: Elderly, wears mask, ambulates with rollator, well-appearing and pleasant Cardiac: Regular rate and rhythm Respiratory: Speaks in full sentences.  Normal work of breathing on room air.  Coughs frequently.  No wheezing or crackles on auscultation, but does have some diminished lung sounds at the bases Extremities: Warm and well-perfused  ASSESSMENT/PLAN:   COPD exacerbation (HCC) Acute exacerbation with cardinal symptoms including increased frequency and severity of cough, purulent sputum Reassuringly, normal work of breathing on room air and she is appropriate for outpatient management Unfortunately ongoing for many weeks since urgent care visit No concern for heart failure exacerbation at this time (no edema, crackles on exam) -Start prednisone 40 mg daily x 5 days -Start azithromycin 500 mg x 3 days -Discussed ER return precautions should she develop signs of respiratory distress, lack of improvement or systemic symptoms -Referral to pulmonology placed -Follow-up visit at [facility] scheduled in 7 days to ensure improvement     Darral Dash, DO [facility]  [facility]

## 2023-04-17 NOTE — Assessment & Plan Note (Signed)
Acute exacerbation with cardinal symptoms including increased frequency and severity of cough, purulent sputum Reassuringly, normal work of breathing on room air and she is appropriate for outpatient management Unfortunately ongoing for many weeks since urgent care visit No concern for heart failure exacerbation at this time (no edema, crackles on exam) -Start prednisone 40 mg daily x 5 days -Start azithromycin 500 mg x 3 days -Discussed ER return precautions should she develop signs of respiratory distress, lack of improvement or systemic symptoms -Referral to pulmonology placed -Follow-up visit at [facility] scheduled in 7 days to ensure improvement

## 2023-04-17 NOTE — Assessment & Plan Note (Signed)
>>  ASSESSMENT AND PLAN FOR COPD EXACERBATION (HCC) WRITTEN ON 04/17/2023 11:18 AM BY DAMERON, MARISA, DO  Acute exacerbation with cardinal symptoms including increased frequency and severity of cough, purulent sputum Reassuringly, normal work of breathing on room air and she is appropriate for outpatient management Unfortunately ongoing for many weeks since urgent care visit No concern for heart failure exacerbation at this time (no edema, crackles on exam) -Start prednisone  40 mg daily x 5 days -Start azithromycin  500 mg x 3 days -Discussed ER return precautions should she develop signs of respiratory distress, lack of improvement or systemic symptoms -Referral to pulmonology placed -Follow-up visit at [facility] scheduled in 7 days to ensure improvement

## 2023-04-19 ENCOUNTER — Encounter: Payer: Self-pay | Admitting: Podiatry

## 2023-04-19 ENCOUNTER — Ambulatory Visit (INDEPENDENT_AMBULATORY_CARE_PROVIDER_SITE_OTHER): Payer: 59 | Admitting: Podiatry

## 2023-04-19 DIAGNOSIS — M79674 Pain in right toe(s): Secondary | ICD-10-CM

## 2023-04-19 DIAGNOSIS — B351 Tinea unguium: Secondary | ICD-10-CM

## 2023-04-19 DIAGNOSIS — E119 Type 2 diabetes mellitus without complications: Secondary | ICD-10-CM | POA: Diagnosis not present

## 2023-04-19 DIAGNOSIS — Z794 Long term (current) use of insulin: Secondary | ICD-10-CM

## 2023-04-19 DIAGNOSIS — M79675 Pain in left toe(s): Secondary | ICD-10-CM | POA: Diagnosis not present

## 2023-04-19 NOTE — Progress Notes (Signed)
This patient returns to my office for at risk foot care.  This patient requires this care by a professional since this patient will be at risk due to having type 2 diabetes.  This patient is unable to cut nails herself since the patient cannot reach her nails.These nails are painful walking and wearing shoes.  This patient presents for at risk foot care today.  General Appearance  Alert, conversant and in no acute stress.  Vascular  Dorsalis pedis and posterior tibial  pulses are palpable  bilaterally.  Capillary return is within normal limits  bilaterally. Temperature is within normal limits  bilaterally.  Neurologic  Senn-Weinstein monofilament wire test within normal limits  bilaterally. Muscle power within normal limits bilaterally.  Nails Thick disfigured discolored nails with subungual debris  from hallux to fifth toes bilaterally. No evidence of bacterial infection or drainage bilaterally.  Orthopedic  No limitations of motion  feet .  No crepitus or effusions noted.  No bony pathology or digital deformities noted.  Midfoot arthritis.  Skin  normotropic skin with no porokeratosis noted bilaterally.  No signs of infections or ulcers noted.     Onychomycosis  Pain in right toes  Pain in left toes  Consent was obtained for treatment procedures.   Mechanical debridement of nails 1-5  bilaterally performed with a nail nipper.  Filed with dremel without incident.     Return office visit    3 months                  Told patient to return for periodic foot care and evaluation due to potential at risk complications.   Gardiner Barefoot DPM

## 2023-04-24 ENCOUNTER — Ambulatory Visit: Payer: Self-pay | Admitting: Family Medicine

## 2023-04-30 ENCOUNTER — Other Ambulatory Visit: Payer: Self-pay | Admitting: Student

## 2023-04-30 DIAGNOSIS — I10 Essential (primary) hypertension: Secondary | ICD-10-CM

## 2023-05-03 ENCOUNTER — Encounter: Payer: Self-pay | Admitting: Family Medicine

## 2023-05-03 ENCOUNTER — Ambulatory Visit (INDEPENDENT_AMBULATORY_CARE_PROVIDER_SITE_OTHER): Payer: 59 | Admitting: Family Medicine

## 2023-05-03 VITALS — BP 128/76 | HR 82 | Ht 62.0 in | Wt 130.8 lb

## 2023-05-03 DIAGNOSIS — H6120 Impacted cerumen, unspecified ear: Secondary | ICD-10-CM | POA: Insufficient documentation

## 2023-05-03 DIAGNOSIS — H6123 Impacted cerumen, bilateral: Secondary | ICD-10-CM

## 2023-05-03 DIAGNOSIS — J411 Mucopurulent chronic bronchitis: Secondary | ICD-10-CM | POA: Diagnosis not present

## 2023-05-03 DIAGNOSIS — Z23 Encounter for immunization: Secondary | ICD-10-CM

## 2023-05-03 HISTORY — DX: Impacted cerumen, unspecified ear: H61.20

## 2023-05-03 MED ORDER — TRELEGY ELLIPTA 100-62.5-25 MCG/ACT IN AEPB
1.0000 | INHALATION_SPRAY | Freq: Every day | RESPIRATORY_TRACT | 2 refills | Status: DC
Start: 2023-05-03 — End: 2023-08-11

## 2023-05-03 NOTE — Patient Instructions (Addendum)
It was great to see you today! Thank you for choosing [facility] for your primary care.  Today we addressed:  COPD and bronchitis We are switching you from the Symbicort inhaler to Trelegy, which is a stronger therapy.  Do that once daily.  I am scheduling you to see our pharmacy team on Monday 9/30 at 10 am in order to get lung testing done and we can go from there.  Earwax Please use Debrox drops over the counter.  Place 3-5 drops in ear and lie on one side for 10-15 minutes.  Then, let liquid drain out and do the other ear.  Do this once or twice daily for 2-3 weeks.  Do not use Q-tips.  Shingles vaccine You can get the Shingrix vaccine at the pharmacy. You will need a 2nd shot 2-6 months after the first. This vaccine is important to help prevent shingles.  Please remember that the shot can make you feel bad for a day or 2, so plan ahead.  Thank you for coming to see [place] at [facility] and for the opportunity to care for you! Conny Situ, MD 05/03/2023, 4:37 PM

## 2023-05-03 NOTE — Progress Notes (Signed)
   SUBJECTIVE:   CHIEF COMPLAINT / HPI: Mercedes Barr is a 77 y.o. female with a pertinent past medical history of HFpEF, hypothyroidism, T2DM, and COPD not on home oxygen presenting to the clinic for follow up after recent COPD exacerbation.  COPD, chronic bronchitis Patient was seen at urgent care on 8/31 and diagnosed with COPD exacerbation in setting of purulent sputum for 1 week and cough.  CXR without acute disease.  Was instructed to do nebulizer treatments TID, but maching was broken.  Was seen in clinic on 9/9 and was not improved at that time.  Was provided 5 day course of prednisone 40 mg, 3 day course of azithromycin 500 mg, and referred to pulmonology.  Today, patient is still having cough with thick green/yellow mucus throughout the day. This cough has been present for months now and has refused to improve. No fevers, no chills, no shortness of breath unless actively coughing. Denies hemoptysis, weight loss, night sweats.  Meds: -Nebulizer has been replaced, currently using 4 times a day. -Albuterol inhaler 3 times a day because she is coughing. -Symbicort twice daily for maintenance. -Guaifenesin tabs every 12 hours. -Per chart review, used to use Trelegy, unclear why it was discontined.   PERTINENT PMH / PSH: HFpEF (01/04/2023 EF 55%), hypothyroidism, T2DM, and COPD not on home oxygen Patient reports she never smoked.  States that she has had a longstanding bronchitis diagnosis.   OBJECTIVE:   BP 128/76   Pulse 82   Ht 5\' 2"  (1.575 m)   Wt 130 lb 12.8 oz (59.3 kg)   SpO2 96%   BMI 23.92 kg/m   General: Age-appropriate, resting comfortably in chair, intermittently coughing, NAD, alert and at baseline. HEENT:  Head: No tenderness to percussion over sinuses. Eyes: No conjunctival erythema or scleral injections. Ears: Significant bilateral cerumen impaction, unable to visualize Tms.  No erythema of external ear canal. Nose: Non-erythematous turbinates. No  rhinorrhea. Mouth/Oral: Clear, no tonsillar exudate. MMM. Cardiovascular: Regular rate and rhythm. Normal S1/S2. No murmurs, rubs, or gallops appreciated. 2+ radial pulses. Pulmonary: Globally diminished breath sounds.  No wheezes, crackles, or rhonchi.  No increased WOB, no accessory muscle usage on room air. Extremities: No peripheral edema bilaterally. Capillary refill <2 seconds.   ASSESSMENT/PLAN:   Cerumen impaction Bilateral cerumen impaction, causing some mild discomfort. -Recommended Debrox drops in each ear, given 10 to 15 minutes, allow to dry -Advised not to use Q-tips  COPD (chronic obstructive pulmonary disease) Suspect more chronic bronchitis rather than acute COPD exacerbation, given that patient's symptoms have been mostly unchanged for past several months, and prednisone/azithromycin course did not help.  Patient is oxygenating well, not requiring supplemental oxygen.  Last PFTs appear to be from 2007, would benefit from repeat evaluate for underlying asthma and consider alternative therapies.  Low concern for TB or malignancy given lack of B symptoms, no risk factors, unremarkable CXR 3 weeks ago. -Transition from Symbicort to triple acting Trelegy once daily -Continue albuterol, nebulizers, guaifenesin -Scheduled PFT appointment with pharmacy -Provided influenza and COVID vaccines -Return precautions provided if symptoms worsening, fevers appear, shortness of breath persists  Health maintenance -Shingrix vaccine sent to pharmacy, counseled patient on risk and benefits of vaccine and encouraged to start series  No follow-ups on file.  [name], MD [facility]

## 2023-05-03 NOTE — Assessment & Plan Note (Signed)
Bilateral cerumen impaction, causing some mild discomfort. -Recommended Debrox drops in each ear, given 10 to 15 minutes, allow to dry -Advised not to use Q-tips

## 2023-05-03 NOTE — Assessment & Plan Note (Addendum)
Suspect more chronic bronchitis rather than acute COPD exacerbation, given that patient's symptoms have been mostly unchanged for past several months, and prednisone/azithromycin course did not help.  Patient is oxygenating well, not requiring supplemental oxygen.  Last PFTs appear to be from 2007, would benefit from repeat evaluate for underlying asthma and consider alternative therapies.  Low concern for TB or malignancy given lack of B symptoms, no risk factors, unremarkable CXR 3 weeks ago. -Transition from Symbicort to triple acting Trelegy once daily -Continue albuterol, nebulizers, guaifenesin -Scheduled PFT appointment with pharmacy -Provided influenza and COVID vaccines -Return precautions provided if symptoms worsening, fevers appear, shortness of breath persists

## 2023-05-08 ENCOUNTER — Ambulatory Visit (INDEPENDENT_AMBULATORY_CARE_PROVIDER_SITE_OTHER): Payer: 59 | Admitting: Pharmacist

## 2023-05-08 ENCOUNTER — Encounter: Payer: Self-pay | Admitting: Pharmacist

## 2023-05-08 VITALS — BP 138/60 | HR 62 | Ht 62.0 in | Wt 133.0 lb

## 2023-05-08 DIAGNOSIS — J42 Unspecified chronic bronchitis: Secondary | ICD-10-CM

## 2023-05-08 NOTE — Progress Notes (Signed)
   S:       Chief Complaint  Patient presents with   Medication Management    PFTs   77 y.o. female who presents for diabetes evaluation, education, and management. Patient arrives in good spirits and presents with a walker. Patient was referred and last seen by Primary Care Provider, Dr. Sharion Dove, on 05/03/23. At last visit, switched from Symbicort (budesonide-formoterol) 80-4.5 mcg to Trelegy Ellipta (fluticasone-umeclidin-vilant) 100-62.5-25 mcg.    PMH is significant for COPD, HFpEF, T2DM.   Patient reports breathing has been unchanged with persistent cough and thick mucus..   Medication adherence reported optimal Patient reports last dose of COPD medications was today Current COPD medications: Trelegy Ellipta (fluticasone-umeclidin-vilant) 100-62.5-25 mcg 1 puff daily. Rescue inhaler use frequency: 3-4 times a day with Ventolin HFA (albuterol) 2 puffs and albuterol nebulizer Patient exacerbation hx: recent ED visit 8/31 for COPD exacerbation, finished course of prednisone 40 mg daily x 5 days and azithromycin 500 mg x 3 days  O: Review of Systems  Respiratory:  Positive for cough and sputum production.     Physical Exam Constitutional:      Appearance: Normal appearance.  Pulmonary:     Effort: Respiratory distress present.  Neurological:     Mental Status: She is alert.  Psychiatric:        Mood and Affect: Mood normal.        Behavior: Behavior normal.        Thought Content: Thought content normal.        Judgment: Judgment normal.    Vitals:   05/08/23 1029 05/08/23 1034  BP: (!) 142/59 138/60  Pulse: 66 62  SpO2: 96% 95%    See Documentation Flowsheet - CAT/COPD for complete symptom scoring.  See "scanned report" or Documentation Flowsheet (discrete results - PFTs) for Spirometry results. Patient provided good effort while attempting spirometry.   Lung Age = 64  Patient is participating in a Managed Medicaid Plan:  Yes  A/P: Patient has been  experiencing COPD, exacerbations, shortness of breath, cough, sputum production for several months. No history of smoking however several jobs in the past which may contribute to current breathing issues. Patient reported using her albuterol this morning. Spirometry evaluation reveals "near normal lung function" following albuterol use this AM. Discussed pulmonology referral for more additional work-up/management with attending provider, Dr. Jennette Kettle. Patient medication adherence appears optimal with COPD medications  -Continue all medications, no changes. -Educated patient on purpose, proper use, potential adverse effects including risk of esophageal candidiasis and need to rinse mouth after each use.   -Reviewed results of pulmonary function tests.  Pt verbalized understanding of results and education.    Written patient instructions provided.   Total time in face to face counseling 35 minutes.    Follow-up:  Pharmacist PRN PCP clinic visit referral to pulmonology Patient seen with Alesia Banda, PharmD Candidate.

## 2023-05-08 NOTE — Patient Instructions (Signed)
It was nice to see you today!  Medication Changes: Continue all other medication the same.

## 2023-05-08 NOTE — Assessment & Plan Note (Signed)
Patient has been experiencing COPD, exacerbations, shortness of breath, cough, sputum production for several months. No history of smoking however several jobs in the past which may contribute to current breathing issues. Patient reported using her albuterol this morning. Spirometry evaluation reveals "near normal lung function" following albuterol use this AM. Discussed pulmonology referral for more additional work-up/management with attending provider, Dr. Jennette Kettle. Patient medication adherence appears optimal with COPD medications  -Continue all medications, no changes.

## 2023-05-09 NOTE — Progress Notes (Signed)
Reviewed and agree with Dr Koval's plan.   

## 2023-05-12 ENCOUNTER — Telehealth: Payer: Self-pay

## 2023-05-12 NOTE — Telephone Encounter (Signed)
-----   Message from [facility] sent at 05/12/2023  2:24 PM EDT ----- Regarding: Beta Blocker Regimen Please call this patient and ensure she is ONLY taking carvedilol and NOT propranolol any longer (I got a notice she had filled both). Thanks so much!

## 2023-05-12 NOTE — Telephone Encounter (Signed)
Called patient to inform the message that Dr. Phineas Real wanted me to tell her about her medications.  Drusilla Kanner, CMA

## 2023-06-27 ENCOUNTER — Telehealth: Payer: Self-pay

## 2023-06-27 ENCOUNTER — Encounter: Payer: Self-pay | Admitting: Family Medicine

## 2023-06-27 ENCOUNTER — Ambulatory Visit: Payer: 59 | Admitting: Family Medicine

## 2023-06-27 VITALS — BP 136/62 | HR 65 | Wt 126.0 lb

## 2023-06-27 DIAGNOSIS — Z794 Long term (current) use of insulin: Secondary | ICD-10-CM | POA: Diagnosis not present

## 2023-06-27 DIAGNOSIS — E119 Type 2 diabetes mellitus without complications: Secondary | ICD-10-CM

## 2023-06-27 DIAGNOSIS — R197 Diarrhea, unspecified: Secondary | ICD-10-CM | POA: Diagnosis not present

## 2023-06-27 DIAGNOSIS — I10 Essential (primary) hypertension: Secondary | ICD-10-CM

## 2023-06-27 HISTORY — DX: Diarrhea, unspecified: R19.7

## 2023-06-27 LAB — POCT GLYCOSYLATED HEMOGLOBIN (HGB A1C): HbA1c, POC (controlled diabetic range): 7.9 % — AB (ref 0.0–7.0)

## 2023-06-27 MED ORDER — ZOSTER VAC RECOMB ADJUVANTED 50 MCG/0.5ML IM SUSR: 0.5000 mL | Freq: Once | INTRAMUSCULAR | 0 refills | Status: AC

## 2023-06-27 MED ORDER — TETANUS-DIPHTH-ACELL PERTUSSIS 5-2.5-18.5 LF-MCG/0.5 IM SUSP: 0.5000 mL | Freq: Once | INTRAMUSCULAR | 0 refills | Status: AC

## 2023-06-27 NOTE — Telephone Encounter (Signed)
Received call from [facility] [facility] regarding office visit note for patient to continue receiving diabetic supplies.   She is currently receiving the [employer] 2 sensors.   Patient was just seen in office today.   Dr. Phineas Real- can you please include that patient use and benefit of these sensors in your office note from today.   Once OV note is completed, this will need to be faxed to [phone].  Veronda Prude, RN

## 2023-06-27 NOTE — Assessment & Plan Note (Signed)
Controlled on Coreg and losartan.  Continue current management.

## 2023-06-27 NOTE — Progress Notes (Addendum)
    SUBJECTIVE:   CHIEF COMPLAINT / HPI:   Diarrhea 2 weeks history. No sick contacts. Never had before. Not taking miralax now but usually takes daily for constipation. No blood in stool. Crampy abdominal pain that improves with stooling. No new meds or changes. No fevers or vomitng. Colonoscopy 2023 with polyps normal. No recent travel.  Diabetes Not able to eat as much sweets due to diarrhea. Has been trying to do better overall.  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, hypertension, HFpEF, COPD, GERD, type 2 diabetes, hypothyroidism, osteoporosis, OA, HLD  OBJECTIVE:   BP 136/62   Pulse 65   Wt 126 lb (57.2 kg)   SpO2 98%   BMI 23.05 kg/m   General: Alert and oriented, in NAD Skin: Warm, dry, and intact HEENT: NCAT, EOM grossly normal, midline nasal septum Cardiac: Regular rate Respiratory: Breathing and speaking comfortably on RA Abdominal: Soft, mildly tender diffusely with some worsening over left abdomen, nondistended, normo/hyperactive bowel sounds Extremities: Moves all extremities grossly equally Neurological: No gross focal deficit Psychiatric: Appropriate mood and affect   ASSESSMENT/PLAN:   Essential hypertension Controlled on Coreg and losartan.  Continue current management.  DM (diabetes mellitus), type 2 (HCC) A1c stable at 7.9 on Jardiance, Trulicity, 20 units daily Lantus, and 12 units Humalog with meals.  Continue current management.  Diabetic foot exam completed today with mild areas of sensory loss on plantar surface bilaterally.  Diarrhea Unclear cause at this time.  Consider overflow diarrhea given history of constipation versus medication side effect (though no medication changes recently). Less likely colonic malignancy given reassuring colonoscopy in 2023.  Less likely infectious given history and exam.  Will obtain CBC and CMP for baseline assessment.  Recommended symptomatic treatment with  loperamide over-the-counter.  Follow-up in 2 weeks or sooner if not being able to hold in fluids.   Health maintenance Patient given printed prescriptions for Shingrix and Tdap.  Janeal Holmes, MD [facility]

## 2023-06-27 NOTE — Patient Instructions (Signed)
Use imotil for your diarrhea. We will collect labs. If you are unable to take in food/drink, let us know. Otherwise, follow up in 2 weeks. I have given you prescriptions for Tdap and Shingles vaccination. Let's hold off on increasing your trulicity until we get your diarrhea under control!

## 2023-06-27 NOTE — Assessment & Plan Note (Addendum)
A1c stable at 7.9 on Jardiance, Trulicity, 20 units daily Lantus, and 12 units Humalog with meals.  Continue current management.  Diabetic foot exam completed today with mild areas of sensory loss on plantar surface bilaterally.

## 2023-06-27 NOTE — Assessment & Plan Note (Signed)
Unclear cause at this time.  Consider overflow diarrhea given history of constipation versus medication side effect (though no medication changes recently). Less likely colonic malignancy given reassuring colonoscopy in 2023.  Less likely infectious given history and exam.  Will obtain CBC and CMP for baseline assessment.  Recommended symptomatic treatment with loperamide over-the-counter.  Follow-up in 2 weeks or sooner if not being able to hold in fluids.

## 2023-06-28 ENCOUNTER — Encounter: Payer: Self-pay | Admitting: Family Medicine

## 2023-06-28 LAB — COMPREHENSIVE METABOLIC PANEL
ALT: 10 [IU]/L (ref 0–32)
AST: 14 [IU]/L (ref 0–40)
Albumin: 4.1 g/dL (ref 3.8–4.8)
Alkaline Phosphatase: 74 [IU]/L (ref 44–121)
BUN/Creatinine Ratio: 15 (ref 12–28)
BUN: 11 mg/dL (ref 8–27)
Bilirubin Total: 0.4 mg/dL (ref 0.0–1.2)
CO2: 25 mmol/L (ref 20–29)
Calcium: 9.4 mg/dL (ref 8.7–10.3)
Chloride: 100 mmol/L (ref 96–106)
Creatinine, Ser: 0.73 mg/dL (ref 0.57–1.00)
Globulin, Total: 3.1 g/dL (ref 1.5–4.5)
Glucose: 191 mg/dL — ABNORMAL HIGH (ref 70–99)
Potassium: 3.6 mmol/L (ref 3.5–5.2)
Sodium: 144 mmol/L (ref 134–144)
Total Protein: 7.2 g/dL (ref 6.0–8.5)
eGFR: 85 mL/min/{1.73_m2} (ref >59)

## 2023-06-28 LAB — CBC
Hematocrit: 40.4 % (ref 34.0–46.6)
Hemoglobin: 12.5 g/dL (ref 11.1–15.9)
MCH: 26.4 pg — ABNORMAL LOW (ref 26.6–33.0)
MCHC: 30.9 g/dL — ABNORMAL LOW (ref 31.5–35.7)
MCV: 85 fL (ref 79–97)
Platelets: 232 10*3/uL (ref 150–450)
RBC: 4.74 x10E6/uL (ref 3.77–5.28)
RDW: 13 % (ref 11.7–15.4)
WBC: 5.3 10*3/uL (ref 3.4–10.8)

## 2023-06-29 ENCOUNTER — Ambulatory Visit: Payer: 59 | Admitting: Cardiovascular Disease

## 2023-07-11 ENCOUNTER — Ambulatory Visit: Payer: Self-pay | Admitting: Family Medicine

## 2023-07-19 ENCOUNTER — Encounter (HOSPITAL_BASED_OUTPATIENT_CLINIC_OR_DEPARTMENT_OTHER): Payer: Self-pay | Admitting: Pulmonary Disease

## 2023-07-19 ENCOUNTER — Institutional Professional Consult (permissible substitution) (HOSPITAL_BASED_OUTPATIENT_CLINIC_OR_DEPARTMENT_OTHER): Payer: 59 | Admitting: Pulmonary Disease

## 2023-07-19 ENCOUNTER — Ambulatory Visit: Payer: 59 | Admitting: Podiatry

## 2023-07-26 ENCOUNTER — Ambulatory Visit: Payer: 59 | Admitting: Podiatry

## 2023-07-28 ENCOUNTER — Other Ambulatory Visit: Payer: Self-pay

## 2023-07-28 DIAGNOSIS — E119 Type 2 diabetes mellitus without complications: Secondary | ICD-10-CM

## 2023-07-29 MED ORDER — BD PEN NEEDLE NANO U/F 32G X 4 MM MISC
1 refills | Status: AC
Start: 2023-07-29 — End: ?

## 2023-08-11 ENCOUNTER — Other Ambulatory Visit: Payer: Self-pay | Admitting: Family Medicine

## 2023-08-11 DIAGNOSIS — J411 Mucopurulent chronic bronchitis: Secondary | ICD-10-CM

## 2023-08-16 ENCOUNTER — Ambulatory Visit (INDEPENDENT_AMBULATORY_CARE_PROVIDER_SITE_OTHER): Payer: Medicaid Other

## 2023-08-16 ENCOUNTER — Encounter: Payer: Self-pay | Admitting: Family Medicine

## 2023-08-16 ENCOUNTER — Ambulatory Visit: Payer: 59 | Admitting: Family Medicine

## 2023-08-16 VITALS — BP 134/95 | HR 81 | Ht 62.0 in | Wt 132.2 lb

## 2023-08-16 DIAGNOSIS — Z794 Long term (current) use of insulin: Secondary | ICD-10-CM | POA: Diagnosis not present

## 2023-08-16 DIAGNOSIS — I442 Atrioventricular block, complete: Secondary | ICD-10-CM | POA: Diagnosis not present

## 2023-08-16 DIAGNOSIS — I10 Essential (primary) hypertension: Secondary | ICD-10-CM | POA: Diagnosis not present

## 2023-08-16 DIAGNOSIS — I5032 Chronic diastolic (congestive) heart failure: Secondary | ICD-10-CM | POA: Diagnosis not present

## 2023-08-16 DIAGNOSIS — J411 Mucopurulent chronic bronchitis: Secondary | ICD-10-CM | POA: Diagnosis not present

## 2023-08-16 DIAGNOSIS — E119 Type 2 diabetes mellitus without complications: Secondary | ICD-10-CM | POA: Diagnosis not present

## 2023-08-16 DIAGNOSIS — J42 Unspecified chronic bronchitis: Secondary | ICD-10-CM

## 2023-08-16 MED ORDER — BACLOFEN 5 MG PO TABS: 5.0000 mg | ORAL_TABLET | Freq: Every day | ORAL | 0 refills | Status: DC | PRN

## 2023-08-16 MED ORDER — FUROSEMIDE 20 MG PO TABS: 40.0000 mg | ORAL_TABLET | Freq: Every day | ORAL | 2 refills | Status: DC | PRN

## 2023-08-16 MED ORDER — TRELEGY ELLIPTA 100-62.5-25 MCG/ACT IN AEPB
1.0000 | INHALATION_SPRAY | Freq: Every day | RESPIRATORY_TRACT | 2 refills | Status: DC
Start: 2023-08-16 — End: 2024-02-05

## 2023-08-16 MED ORDER — EPINEPHRINE 0.3 MG/0.3ML IJ SOAJ: 0.3000 mg | INTRAMUSCULAR | 0 refills | Status: AC | PRN

## 2023-08-16 MED ORDER — TRULICITY 0.75 MG/0.5ML ~~LOC~~ SOAJ: 0.7500 mg | SUBCUTANEOUS | 2 refills | Status: DC

## 2023-08-16 MED ORDER — TETANUS-DIPHTH-ACELL PERTUSSIS 5-2.5-18.5 LF-MCG/0.5 IM SUSP: 0.5000 mL | Freq: Once | INTRAMUSCULAR | 0 refills | Status: AC

## 2023-08-16 MED ORDER — ZOSTER VAC RECOMB ADJUVANTED 50 MCG/0.5ML IM SUSR: 0.5000 mL | Freq: Once | INTRAMUSCULAR | 0 refills | Status: AC

## 2023-08-16 NOTE — Progress Notes (Signed)
    SUBJECTIVE:   CHIEF COMPLAINT / HPI:   T2DM Able to obtain CGM supplies from last visit. 132 this AM with the monitor. Trulicity  not on due to not sending it to her due to last visit 10/2022 due to concern with gallstones, but she has since had her gallbladder removed. Would like to go back on this medication.  HTN Was not able to take coreg  or losartan  this morning.  She has been having a lot going on, as her son has stage IV brain cancer.  She also recounts difficulties with her daughter's passing; unfortunately, her daughter was murdered and dismembered in the past.  Refills needed Patient needs refills of baclofen , EpiPen , Lasix , and Trelegy.  She would like for acute meds to be [facility] but would like long-term medicines to be sent to [facility].  PERTINENT  PMH / PSH: Ventricular ectopy, Takotsubo, persistent atrial fibs, hypertension, complete heart block with pacemaker inserted, CHF with preserved ejection fraction, COPD, GERD, hypothyroidism, OA  OBJECTIVE:   BP (!) 134/95   Pulse 81   Ht 5' 2 (1.575 m)   Wt 132 lb 3.2 oz (60 kg)   SpO2 99%   BMI 24.18 kg/m   General: Alert and oriented, in NAD Skin: Warm, dry, and intact HEENT: NCAT, EOM grossly normal, midline nasal septum Cardiac: RRR, no m/r/g appreciated Respiratory: CTAB, breathing and speaking comfortably on RA Abdominal: Soft, nontender, nondistended, normoactive bowel sounds Extremities: Moves all extremities grossly equally Neurological: No gross focal deficit Psychiatric: Appropriate mood and affect   ASSESSMENT/PLAN:   DM (diabetes mellitus), type 2 (HCC) A1c stable at 7.9 while off Trulicity .  Will represcribe at starting dose given patient has been off of this and since cholecystectomy.  Follow-up in 2 months for A1c recheck or sooner if needed.  Essential hypertension Unable to take losartan  and Coreg  this morning.  Life stressors also unfortunately contributing.  Advised to take when  she gets home.  Follow-up at next visit.  COPD (chronic obstructive pulmonary disease) Will refill Trelegy.  (HFpEF) heart failure with preserved ejection fraction (HCC) Will refill Lasix .   Health maintenance Was not able to get shingrix and Tdap from last visit.  Given printed copies of prescriptions again to receive at pharmacy.  Stuart Redo, MD [facility]

## 2023-08-16 NOTE — Assessment & Plan Note (Signed)
 Will refill Lasix.

## 2023-08-16 NOTE — Assessment & Plan Note (Signed)
 A1c stable at 7.9 while off Trulicity.  Will represcribe at starting dose given patient has been off of this and since cholecystectomy.  Follow-up in 2 months for A1c recheck or sooner if needed.

## 2023-08-16 NOTE — Assessment & Plan Note (Addendum)
 Unable to take losartan and Coreg this morning.  Life stressors also unfortunately contributing.  Advised to take when she gets home.  Follow-up at next visit.

## 2023-08-16 NOTE — Patient Instructions (Addendum)
 You are doing so great and are so strong!  I am here to help with anything you need. I will look into your Trulicity  for you and see if we get this back. Be sure to take that blood pressure medication when you get home. I will refill the medications you need. Come back in 2 months to recheck A1c and your blood pressure or sooner if you need me before then. I have given you prescriptions for the tetanus vaccine and the shingles vaccine.  You can take these to the pharmacy whenever you would like to get them.

## 2023-08-16 NOTE — Assessment & Plan Note (Signed)
 Will refill Trelegy.

## 2023-08-19 ENCOUNTER — Telehealth: Payer: Self-pay | Admitting: Family Medicine

## 2023-08-19 MED ORDER — TRULICITY 0.75 MG/0.5ML ~~LOC~~ SOAJ: 0.7500 mg | SUBCUTANEOUS | 2 refills | Status: DC

## 2023-08-19 NOTE — Telephone Encounter (Signed)
 Received notification from [facility] that patient's Trulicity is unable to be filled from their supply at this time.  [facility] [address] [facility].

## 2023-08-21 LAB — CUP PACEART REMOTE DEVICE CHECK
Battery Impedance: 656 Ohm
Battery Remaining Longevity: 85 mo
Battery Voltage: 2.77 V
Brady Statistic RV Percent Paced: 99 %
Date Time Interrogation Session: 20250113130028
Implantable Lead Connection Status: 753985
Implantable Lead Connection Status: 753985
Implantable Lead Implant Date: 19990203
Implantable Lead Implant Date: 19990203
Implantable Lead Location: 753859
Implantable Lead Location: 753860
Implantable Lead Model: 5068
Implantable Lead Model: 5092
Implantable Pulse Generator Implant Date: 20180711
Lead Channel Impedance Value: 67 Ohm
Lead Channel Impedance Value: 696 Ohm
Lead Channel Pacing Threshold Amplitude: 0.75 V
Lead Channel Pacing Threshold Pulse Width: 0.4 ms
Lead Channel Setting Pacing Amplitude: 2.5 V
Lead Channel Setting Pacing Pulse Width: 0.4 ms
Lead Channel Setting Sensing Sensitivity: 4 mV
Zone Setting Status: 755011
Zone Setting Status: 755011

## 2023-08-29 ENCOUNTER — Telehealth: Payer: Self-pay

## 2023-08-29 MED ORDER — MELATONIN 3-10 MG PO TABS: 3.0000 mg | ORAL_TABLET | Freq: Every day | ORAL | Status: DC

## 2023-08-29 MED ORDER — BUPROPION HCL ER (XL) 150 MG PO TB24: 150.0000 mg | ORAL_TABLET | Freq: Every day | ORAL | 0 refills | Status: DC

## 2023-08-29 NOTE — Telephone Encounter (Signed)
Patient calls nurse line requesting that message be sent to Dr. Phineas Real. Patient reports that her son past away on Sunday morning and she has been having difficulty with sleeping.   She is requesting that medication be sent in to aide with sleep.   Will forward request to PCP.   Veronda Prude, RN

## 2023-08-29 NOTE — Addendum Note (Signed)
Addended by: Evette Georges B on: 08/29/2023 03:41 PM   Modules accepted: Orders

## 2023-08-29 NOTE — Telephone Encounter (Signed)
Called patient back and expressed my sincerest condolences for the passing of her son after his battle with brain cancer. She recounts how it is very difficult to lose her first born and second born within her lifetime. Her daughter and son-in-law are staying with her now. She denies any thoughts of harming herself and a desire to be there for her family at this time.  We discussed options for sleep. Her difficulty sleeping is due to grief. I recommended melatonin 3-5 mg at bedtime to help with sleep. Discussed that other options, including hydroxyzine, should be only be considered if this fails due to increased risk of confusion and falls at her age.   She also endorsed interest in going up on depression medication during this time. She is on Celexa 20 mg daily which is max dose for her age. Will add bupropion ER 150 mg daily to augment. She has no history of seizures. Can also consider mirtazapine if not tolerating bupropion; however, would also need to be careful with side effects in her age from sedation, though this would also help with sleep.  Advised would like to see her back in the office within a month given this recent stressor and new medication initiation or sooner if she needs me before then. Patient understanding and appreciative.

## 2023-08-31 ENCOUNTER — Other Ambulatory Visit: Payer: Self-pay

## 2023-08-31 MED ORDER — LEVOTHYROXINE SODIUM 88 MCG PO TABS: 88.0000 ug | ORAL_TABLET | Freq: Every day | ORAL | 1 refills | Status: DC

## 2023-09-05 ENCOUNTER — Other Ambulatory Visit: Payer: Self-pay | Admitting: Student

## 2023-09-05 ENCOUNTER — Other Ambulatory Visit: Payer: Self-pay | Admitting: Family Medicine

## 2023-09-08 ENCOUNTER — Other Ambulatory Visit: Payer: Self-pay

## 2023-09-08 DIAGNOSIS — R1013 Epigastric pain: Secondary | ICD-10-CM

## 2023-09-08 MED ORDER — FAMOTIDINE 20 MG PO TABS
20.0000 mg | ORAL_TABLET | Freq: Two times a day (BID) | ORAL | 1 refills | Status: DC
Start: 2023-09-08 — End: 2024-01-29

## 2023-09-11 ENCOUNTER — Telehealth: Payer: Self-pay

## 2023-09-11 MED ORDER — RAMELTEON 8 MG PO TABS: 8.0000 mg | ORAL_TABLET | Freq: Every day | ORAL | 0 refills | Status: DC

## 2023-09-11 NOTE — Telephone Encounter (Signed)
Given continued difficulty sleeping during this difficult time, will send in short course of ramelteon. This is associated with less delirium and risk of falls in elderly patients, and it would be a better option than other serotonergic medications (trazodone, mirtazepine) given patient is on an SSRI and tramadol.   Green team: please advise her of this prescription and to keep [place] updated on status and any side effects, including dizziness, confusion, or falls.

## 2023-09-11 NOTE — Telephone Encounter (Signed)
Patient calls nurse line requesting to speak with PCP.   She reports the loss of her son has been very hard on her and she has not been able to sleep.   She reports she tried Melatonin for a few nights with no relief.   She reports she a lot going on right now and can not come in for an apt.   She is requesting a short course of something to help her sleep.   She reports a strong support system and no thoughts of self harm.  Advised will forward to PCP.

## 2023-09-12 ENCOUNTER — Other Ambulatory Visit: Payer: Self-pay

## 2023-09-12 MED ORDER — BACLOFEN 5 MG PO TABS: 5.0000 mg | ORAL_TABLET | Freq: Every day | ORAL | 1 refills | Status: DC | PRN

## 2023-09-12 NOTE — Telephone Encounter (Signed)
 Patient LVM on nurse line regarding issues with prescription at pharmacy. She states that they need permission to fill, they have no more refills.   Attempted to call patient back multiple times. She did not answer and mailbox was full.   Called pharmacy. Pharmacist states that patient has an open refill request for baclofen .   Pended refill request to this encounter.   Chiquita JAYSON English, RN

## 2023-09-12 NOTE — Telephone Encounter (Signed)
 Spoke with patient.   She reports she picked this prescription up yesterday and slept much better last night.   She denies any dizziness, confusion or feeling off balance.   Patient counseled on possible side effects.   Patient appreciative of PCP.

## 2023-09-18 ENCOUNTER — Other Ambulatory Visit: Payer: Self-pay

## 2023-09-18 DIAGNOSIS — I4819 Other persistent atrial fibrillation: Secondary | ICD-10-CM

## 2023-09-18 MED ORDER — RIVAROXABAN 20 MG PO TABS
20.0000 mg | ORAL_TABLET | Freq: Every day | ORAL | 3 refills | Status: DC
Start: 2023-09-18 — End: 2024-05-14

## 2023-09-27 NOTE — Addendum Note (Signed)
Addended by: Elease Etienne A on: 09/27/2023 09:19 AM   Modules accepted: Orders

## 2023-09-27 NOTE — Progress Notes (Signed)
 Remote pacemaker transmission.

## 2023-09-28 ENCOUNTER — Ambulatory Visit: Payer: 59 | Admitting: Cardiovascular Disease

## 2023-09-29 ENCOUNTER — Other Ambulatory Visit: Payer: Self-pay | Admitting: Family Medicine

## 2023-10-05 ENCOUNTER — Other Ambulatory Visit: Payer: Self-pay | Admitting: Family Medicine

## 2023-10-11 ENCOUNTER — Ambulatory Visit (INDEPENDENT_AMBULATORY_CARE_PROVIDER_SITE_OTHER): Payer: 59 | Admitting: Podiatry

## 2023-10-11 ENCOUNTER — Encounter: Payer: Self-pay | Admitting: Podiatry

## 2023-10-11 DIAGNOSIS — E119 Type 2 diabetes mellitus without complications: Secondary | ICD-10-CM

## 2023-10-11 DIAGNOSIS — M79674 Pain in right toe(s): Secondary | ICD-10-CM

## 2023-10-11 DIAGNOSIS — Z794 Long term (current) use of insulin: Secondary | ICD-10-CM

## 2023-10-11 DIAGNOSIS — B351 Tinea unguium: Secondary | ICD-10-CM

## 2023-10-11 DIAGNOSIS — M79675 Pain in left toe(s): Secondary | ICD-10-CM | POA: Diagnosis not present

## 2023-10-11 NOTE — Progress Notes (Signed)
This patient returns to my office for at risk foot care.  This patient requires this care by a professional since this patient will be at risk due to having type 2 diabetes.  This patient is unable to cut nails herself since the patient cannot reach her nails.These nails are painful walking and wearing shoes.  This patient presents for at risk foot care today.  General Appearance  Alert, conversant and in no acute stress.  Vascular  Dorsalis pedis and posterior tibial  pulses are palpable  bilaterally.  Capillary return is within normal limits  bilaterally. Temperature is within normal limits  bilaterally.  Neurologic  Senn-Weinstein monofilament wire test within normal limits  bilaterally. Muscle power within normal limits bilaterally.  Nails Thick disfigured discolored nails with subungual debris  from hallux to fifth toes bilaterally. No evidence of bacterial infection or drainage bilaterally.  Orthopedic  No limitations of motion  feet .  No crepitus or effusions noted.  No bony pathology or digital deformities noted.  Midfoot arthritis.  Skin  normotropic skin with no porokeratosis noted bilaterally.  No signs of infections or ulcers noted.     Onychomycosis  Pain in right toes  Pain in left toes  Consent was obtained for treatment procedures.   Mechanical debridement of nails 1-5  bilaterally performed with a nail nipper.  Filed with dremel without incident.     Return office visit    3 months                  Told patient to return for periodic foot care and evaluation due to potential at risk complications.   Gardiner Barefoot DPM

## 2023-10-23 ENCOUNTER — Ambulatory Visit

## 2023-10-23 ENCOUNTER — Encounter (INDEPENDENT_AMBULATORY_CARE_PROVIDER_SITE_OTHER): Payer: 59 | Admitting: *Deleted

## 2023-10-23 NOTE — Progress Notes (Cosign Needed Addendum)
NO CHARGE

## 2023-10-24 ENCOUNTER — Other Ambulatory Visit: Payer: Self-pay | Admitting: Family Medicine

## 2023-10-25 ENCOUNTER — Ambulatory Visit (INDEPENDENT_AMBULATORY_CARE_PROVIDER_SITE_OTHER): Admitting: Family Medicine

## 2023-10-25 VITALS — BP 122/58 | HR 64 | Ht 62.0 in | Wt 140.0 lb

## 2023-10-25 DIAGNOSIS — I10 Essential (primary) hypertension: Secondary | ICD-10-CM | POA: Diagnosis not present

## 2023-10-25 DIAGNOSIS — Z794 Long term (current) use of insulin: Secondary | ICD-10-CM | POA: Diagnosis not present

## 2023-10-25 DIAGNOSIS — E119 Type 2 diabetes mellitus without complications: Secondary | ICD-10-CM | POA: Diagnosis not present

## 2023-10-25 LAB — POCT GLYCOSYLATED HEMOGLOBIN (HGB A1C): HbA1c, POC (controlled diabetic range): 9.1 % — AB (ref 0.0–7.0)

## 2023-10-25 MED ORDER — CITALOPRAM HYDROBROMIDE 40 MG PO TABS: 40.0000 mg | ORAL_TABLET | Freq: Every day | ORAL | 3 refills | Status: DC

## 2023-10-25 NOTE — Assessment & Plan Note (Signed)
 Blood pressure at goal today without endorsed symptoms.  Continue current management.

## 2023-10-25 NOTE — Progress Notes (Signed)
    SUBJECTIVE:   CHIEF COMPLAINT / HPI:   Depression Has been having difficulty with mood since her son's passing in January.  She still has difficulty sleeping.  She does have a lot of family support which has been very helpful for her.  She also relies on her faith.  She is continue to take her Celexa and bupropion with good effects.  T2DM Feels A1c may be higher. Has been eating a lot of sweets.  She has also been eating more in general after her son's death.  HTN Taking all of her medications as prescribed without issue.  PERTINENT  PMH / PSH: PAF, complete heart block status post pacemaker, HFpEF, COPD  OBJECTIVE:   BP (!) 122/58   Pulse 64   Ht 5\' 2"  (1.575 m)   Wt 140 lb (63.5 kg)   SpO2 97%   BMI 25.61 kg/m   General: Alert and oriented, in NAD Skin: Warm, dry, and intact without lesions HEENT: NCAT, EOM grossly normal, midline nasal septum Cardiac: RRR, no m/r/g appreciated Respiratory: CTAB, breathing and speaking comfortably on RA Abdominal: Soft, nontender, nondistended, normoactive bowel sounds Extremities: Moves all extremities grossly equally Neurological: No gross focal deficit Psychiatric: Appropriate mood and affect, PHQ-9 = 15 with negative #9  ASSESSMENT/PLAN:   DM (diabetes mellitus), type 2 (HCC) A1c elevated today to 9.1 from 7.9 prior.  In the setting of dietary indiscretions with increased stress and grief.  Discussed continuing current medication regimen with patient, as she is motivated to get back to her previous diet.  Follow-up in 3 months.  Essential hypertension Blood pressure at goal today without endorsed symptoms.  Continue current management.  DEPRESSION Acutely worsened symptoms in light of her recent stressors.  PHQ-9 #9 negative, though PHQ-9 is elevated from prior.  After shared decision making and discussion of risk in increasing her medicine, will increase Celexa to 40 mg daily.  Continue bupropion 150 mg daily.  Advised to let  me know if she feels more dizzy, confused, or has any side effects to the increased dose.  Follow-up at next visit.   Health maintenance Referred for MAW visit.  Janeal Holmes, MD [facility]

## 2023-10-25 NOTE — Patient Instructions (Signed)
 I have increased your celexa. Let me know if this does not help with mood and sleep.  Your A1c is higher today, but you are already making great changes. We will recheck in June.  Continue the great work with your blood pressure!  Let me know if you need me before 3 months. It is always so good to see you!

## 2023-10-25 NOTE — Assessment & Plan Note (Signed)
 A1c elevated today to 9.1 from 7.9 prior.  In the setting of dietary indiscretions with increased stress and grief.  Discussed continuing current medication regimen with patient, as she is motivated to get back to her previous diet.  Follow-up in 3 months.

## 2023-10-25 NOTE — Assessment & Plan Note (Addendum)
 Acutely worsened symptoms in light of her recent stressors.  PHQ-9 #9 negative, though PHQ-9 is elevated from prior.  After shared decision making and discussion of risk in increasing her medicine, will increase Celexa to 40 mg daily.  Continue bupropion 150 mg daily.  Advised to let me know if she feels more dizzy, confused, or has any side effects to the increased dose.  Follow-up at next visit.

## 2023-10-26 LAB — MICROALBUMIN / CREATININE URINE RATIO
Creatinine, Urine: 62.7 mg/dL
Microalb/Creat Ratio: 15 mg/g{creat} (ref 0–29)
Microalbumin, Urine: 9.5 ug/mL

## 2023-10-27 ENCOUNTER — Encounter (HOSPITAL_BASED_OUTPATIENT_CLINIC_OR_DEPARTMENT_OTHER): Payer: Self-pay | Admitting: Family Medicine

## 2023-10-30 ENCOUNTER — Telehealth: Payer: Self-pay

## 2023-10-30 NOTE — Telephone Encounter (Signed)
 Patient LVM on nurse line regarding follow up from visit on 10/25/23.  Patient's celexa was increased at last visit from 20 mg to 40 mg. She reports that since increasing dosage, she has been experiencing dizziness and double vision. She feels that dizziness is worse with movement. Symptoms have been present since last Thursday.   Patient reports normal BP readings. Last measured at 110/59.   Denies nausea, vomiting, diarrhea, abdominal pain.   She reports that she has not taken Celexa or Bupropion today as she does not want to worsen symptoms.   Offered patient appointment for tomorrow for further evaluation. She declined stating that the earliest she could come in is next week. She also only wants to see PCP.   Scheduled patient for next available appointment with PCP.   ED precautions discussed.   Veronda Prude, RN

## 2023-10-31 ENCOUNTER — Other Ambulatory Visit: Payer: Self-pay

## 2023-10-31 MED ORDER — INSULIN LISPRO (1 UNIT DIAL) 100 UNIT/ML (KWIKPEN): 12.0000 [IU] | PEN_INJECTOR | Freq: Two times a day (BID) | SUBCUTANEOUS | 11 refills | Status: DC

## 2023-10-31 NOTE — Telephone Encounter (Signed)
 Attempted to call patient to provide with message from Dr. Phineas Real. She did not answer and mailbox was full.   Will try to reach patient at later time.   Veronda Prude, RN

## 2023-11-13 NOTE — Telephone Encounter (Signed)
 Patient returns call to nurse line. Advised of message per PCP.   Patient voices understanding.   Veronda Prude, RN

## 2023-11-16 ENCOUNTER — Encounter: Payer: Self-pay | Admitting: Cardiovascular Disease

## 2023-11-16 ENCOUNTER — Other Ambulatory Visit: Payer: Self-pay | Admitting: Family Medicine

## 2023-11-16 ENCOUNTER — Ambulatory Visit: Payer: 59 | Attending: Cardiovascular Disease | Admitting: Cardiovascular Disease

## 2023-11-16 VITALS — BP 120/58 | HR 72 | Ht 62.0 in | Wt 136.0 lb

## 2023-11-16 DIAGNOSIS — I4821 Permanent atrial fibrillation: Secondary | ICD-10-CM | POA: Diagnosis not present

## 2023-11-16 DIAGNOSIS — D6869 Other thrombophilia: Secondary | ICD-10-CM | POA: Diagnosis not present

## 2023-11-16 DIAGNOSIS — Z950 Presence of cardiac pacemaker: Secondary | ICD-10-CM | POA: Diagnosis not present

## 2023-11-16 DIAGNOSIS — I442 Atrioventricular block, complete: Secondary | ICD-10-CM | POA: Diagnosis not present

## 2023-11-16 DIAGNOSIS — E119 Type 2 diabetes mellitus without complications: Secondary | ICD-10-CM

## 2023-11-16 DIAGNOSIS — Z794 Long term (current) use of insulin: Secondary | ICD-10-CM

## 2023-11-16 DIAGNOSIS — I10 Essential (primary) hypertension: Secondary | ICD-10-CM

## 2023-11-16 DIAGNOSIS — Q212 Atrioventricular septal defect, unspecified as to partial or complete: Secondary | ICD-10-CM

## 2023-11-16 DIAGNOSIS — E785 Hyperlipidemia, unspecified: Secondary | ICD-10-CM

## 2023-11-16 DIAGNOSIS — J42 Unspecified chronic bronchitis: Secondary | ICD-10-CM

## 2023-11-16 DIAGNOSIS — I4819 Other persistent atrial fibrillation: Secondary | ICD-10-CM

## 2023-11-16 NOTE — Patient Instructions (Addendum)
 Medication Instructions:  No changes *If you need a refill on your cardiac medications before your next appointment, please call your pharmacy*  Follow-Up: At [facility], you and your health needs are our priority.  As part of our continuing mission to provide you with exceptional heart care, our providers are all part of one team.  This team includes your primary Cardiologist (physician) and Advanced Practice Providers or APPs (Physician Assistants and Nurse Practitioners) who all work together to provide you with the care you need, when you need it.  Your next appointment:   1 year(s)- Pacer check  Provider:   Thurmon Fair, MD     We recommend signing up for the patient portal called "MyChart".  Sign up information is provided on this After Visit Summary.  MyChart is used to connect with patients for Virtual Visits (Telemedicine).  Patients are able to view lab/test results, encounter notes, upcoming appointments, etc.  Non-urgent messages can be sent to your provider as well.   To learn more about what you can do with MyChart, go to [email or web address].        1st Floor: - Lobby - Registration  - Pharmacy  - Lab - Cafe  2nd Floor: - PV Lab - Diagnostic Testing (echo, CT, nuclear med)  3rd Floor: - Vacant  4th Floor: - TCTS (cardiothoracic surgery) - AFib Clinic - Structural Heart Clinic - Vascular Surgery  - Vascular Ultrasound  5th Floor: - HeartCare Cardiology (general and EP) - Clinical Pharmacy for coumadin, hypertension, lipid, weight-loss medications, and med management appointments    Valet parking services will be available as well.

## 2023-11-16 NOTE — Progress Notes (Unsigned)
 Patient ID: Mercedes Barr, female   DOB: 1946-02-09, 78 y.o.   MRN: [id number]    Cardiology Office Note    Date:  11/17/2023   ID:  PIA JEDLICKA, DOB [birth year], MRN [id number]  PCP:  Evette Georges, MD  Cardiologist:   Thurmon Fair, MD   No chief complaint on file.   History of Present Illness:  Mercedes Barr is a 78 y.o. female with complete heart block, pacemaker dependent (Medtronic Adapta 2018), history of endocardial cushion defect repair, heart failure with preserved left ventricular systolic function, longstanding persistent atrial fibrillation on chronic anticoagulation, asthma/COPD, DM2, HTN, hypothyroidism, GERD.  She had Takotsubo syndrome in the setting of surgery for acute cholecystitis in April 2024 and had complete recovery of LV function.  During that time underwent coronary angiography which showed moderate, mostly nonobstructive CAD (30% RCA, 40% LAD, 80% first diagonal).  This has been managed medically.  She has not had any new cardiovascular events.  The last year has been dominated by the illness of her son, who died from metastatic cancer.  She took care of him at home until the end.  This has marked her considerably.  She has previously lost a child to homicide.  She thought she could deal with it okay but has decided she will see a counselor to help with her grief.  Pacemaker interrogation shows normal device function.  Her Medtronic Adapta DR device was implanted in 2018 but is now programmed VVIR for permanent atrial fibrillation.  She is pacemaker dependent.  Presenting rhythm is atrial fibrillation with ventricular pacing.  Estimated generator longevity is about 7 years.  Lead parameters are normal.  She has virtually 100% ventricular pacing.  She had multiple episodes of nonsustained VT when she had Takotsubo syndrome in 2024, but none since.   She had surgical repair of atrioventricular cushion defect repair in 1988 when she was 78 years old. Surgery was  complicated by development of complete heart block and she received a pacemaker (her initial device probably was an abdominal generator with epicardial leads). A right subclavian pacemaker was placed in 1991, but had to be removed because of complaints of pain. She had a complete revision of the system in 1999 and her current left subclavian atrial and ventricular leads date to that time. Her most recent generator change out occurred in 2008. The atrial lead is a Medtronic [id number]. The ventricular lead is Medtronic [id number].  She underwent a pacemaker generator change out in 2018, with a Tyrx pouch.    Past Medical History:  Diagnosis Date   Arthritis 11/06/2012   Tr TKR   Asthma    Chronic heart failure with preserved ejection fraction (HFpEF) (HCC)    Complete heart block (HCC)    Congenital heart disease    atrioventricular cushion defect repair age 81 (17)   COPD (chronic obstructive pulmonary disease) (HCC)    Diabetes mellitus    GERD (gastroesophageal reflux disease)    Heart murmur    Hypertension    Hypothyroidism    Longstanding persistent atrial fibrillation (HCC)    Normal coronary arteries 08/08/2009   Pacemaker    PACEMAKER DEPENDENT-DR. Ameyah Bangura - [facility].  OFFICE NOTE DR. A. LITTLE STATES  "EXTREMELY PACEMAKER SENSITIVE AND WHEN YOU TRY TO CHECK FOR UNDERLYING RHYTHMS SHE WILL HAVE SNYCOPE AND WE HAVE NOT DONE THIS NOW IN ABOUT 2 YEARS".   Shortness of breath dyspnea    walking distance or climbing stairs  Past Surgical History:  Procedure Laterality Date   ATRIOVENTRICULAR CUSHION DEFECT REPAIR  1988   CARDIAC CATHETERIZATION  2011    SHOWED NO CAD-PER CARDIOLOGY OFFICE NOTES DR. A. LITTLE   CHOLECYSTECTOMY N/A 11/19/2022   Procedure: LAPAROSCOPIC CHOLECYSTECTOMY;  Surgeon: Andria Meuse, MD;  Location: [facility] OR;  Service: General;  Laterality: N/A;   colonscopy      removed polyps    INSERT / REPLACE / REMOVE PACEMAKER     LAPAROSCOPIC  LYSIS OF ADHESIONS  11/19/2022   Procedure: LAPAROSCOPIC LYSIS OF ADHESIONS;  Surgeon: Andria Meuse, MD;  Location: [facility] OR;  Service: General;;   LEFT HEART CATH AND CORONARY ANGIOGRAPHY N/A 11/20/2022   Procedure: LEFT HEART CATH AND CORONARY ANGIOGRAPHY;  Surgeon: Orbie Pyo, MD;  Location: [facility] INVASIVE CV LAB;  Service: Cardiovascular;  Laterality: N/A;   PACEMAKER INSERTION  1988   last gen 11/08- MDT   PPM GENERATOR CHANGEOUT N/A 02/15/2017   Procedure: PPM Generator Changeout;  Surgeon: Thurmon Fair, MD;  Location: [facility]VAS[facility] CV LAB;  Service: Cardiovascular;  Laterality: N/A;   TOTAL KNEE ARTHROPLASTY Right 11/19/2012   Procedure: RIGHT TOTAL KNEE ARTHROPLASTY;  Surgeon: Loanne Drilling, MD;  Location: [facility] ORS;  Service: Orthopedics;  Laterality: Right;   TOTAL KNEE REVISION Right 09/24/2014   Procedure: RIGHT TOTAL KNEE ARTHROPLASTY REVISION;  Surgeon: Loanne Drilling, MD;  Location: [facility]S;  Service: Orthopedics;  Laterality: Right;   TUBAL LIGATION      Current Medications: Outpatient Medications Prior to Visit  Medication Sig Dispense Refill   acetaminophen (TYLENOL) 325 MG tablet Take 2 tablets (650 mg total) by mouth every 6 (six) hours as needed. 30 tablet 0   albuterol (PROVENTIL) (2.5 MG/3ML) 0.083% nebulizer solution TAKE 3 ML (2.5 MG TOTAL) BY NEBULIZATION EVERY 4 HOURS AS NEEDED FOR WHEEZING OR SHORTNESS OF BREATH 150 mL 1   albuterol (VENTOLIN HFA) 108 (90 Base) MCG/ACT inhaler INHALE 2 PUFFS EVERY 4 HOURS AS NEEDED FOR WHEEZING 51 each 1   Baclofen 5 MG TABS TAKE 1 TABLET (5 MG TOTAL) BY MOUTH DAILY AS NEEDED (MUSCLE SPASMS). 90 tablet 0   Blood Glucose Monitoring Suppl (ONE TOUCH ULTRA 2) w/Device KIT Use to check blood sugar as directed. 1 each 0   buPROPion (WELLBUTRIN XL) 150 MG 24 hr tablet TAKE 1 TABLET BY MOUTH EVERY DAY 90 tablet 1   carvedilol (COREG) 6.25 MG tablet TAKE 1 TABLET BY MOUTH 2 TIMES DAILY WITH A MEAL. 180 tablet 1   cetirizine (ZYRTEC  ALLERGY) 10 MG tablet Take 1 tablet (10 mg total) by mouth daily. 30 tablet 1   citalopram (CELEXA) 40 MG tablet Take 1 tablet (40 mg total) by mouth daily. 30 tablet 3   diclofenac Sodium (VOLTAREN) 1 % GEL APPLY 2 GRAMS TO AFFECTED AREA 4 TIMES A DAY 300 g 2   Dulaglutide (TRULICITY) 0.75 MG/0.5ML SOAJ Inject 0.75 mg into the skin once a week. 2 mL 2   empagliflozin (JARDIANCE) 25 MG TABS tablet Take 1 tablet (25 mg total) by mouth daily. 90 tablet 3   EPINEPHrine (EPIPEN 2-PAK) 0.3 mg/0.3 mL IJ SOAJ injection Inject 0.3 mg into the muscle as needed for anaphylaxis. 2 each 0   famotidine (PEPCID) 20 MG tablet Take 1 tablet (20 mg total) by mouth 2 (two) times daily. 180 tablet 1   Fluticasone-Umeclidin-Vilant (TRELEGY ELLIPTA) 100-62.5-25 MCG/ACT AEPB Inhale 1 puff into the lungs daily. 60 each 2   furosemide (  LASIX) 20 MG tablet Take 2 tablets (40 mg total) by mouth daily as needed. 180 tablet 2   glucose blood (ONE TOUCH ULTRA TEST) test strip Use for blood sugar testing 1-3 times daily 100 each 12   insulin lispro (HUMALOG KWIKPEN) 100 UNIT/ML KwikPen Inject 12 Units into the skin 2 (two) times daily before a meal. 15 mL 11   Insulin Pen Needle (BD PEN NEEDLE NANO U/F) 32G X 4 MM MISC USE AS DIRECTED WITH INSULIN 4  TIMES DAILY 400 each 1   Lancets MISC Use with blood sugar monitor to check daily 100 each 11   LANTUS SOLOSTAR 100 UNIT/ML Solostar Pen INJECT SUBCUTANEOUSLY 40 UNITS  DAILY 45 mL 2   levothyroxine (SYNTHROID) 88 MCG tablet Take 1 tablet (88 mcg total) by mouth daily at 6 (six) AM. 90 tablet 1   losartan (COZAAR) 25 MG tablet TAKE 1 TABLET BY MOUTH DAILY 100 tablet 2   Melatonin 3-10 MG TABS Take 3-5 mg by mouth at bedtime.     omeprazole (PRILOSEC) 20 MG capsule TAKE 1 CAPSULE BY MOUTH  TWICE DAILY 180 capsule 3   polyethylene glycol powder (GLYCOLAX/MIRALAX) powder TAKE 17 GRAMS BY MOUTH 2  TIMES DAILY AS NEEDED FOR  LAXATIVE (Patient taking differently: TAKE 17 GRAMS BY MOUTH 2   TIMES DAILY AS NEEDED FOR  LAXATIVE) 1054 g 0   PRODIGY NO CODING BLOOD GLUC test strip USE THREE TIMES A DAY TO CHECK ON BLOOD GLUCOSE LEVEL 100 strip 12   ramelteon (ROZEREM) 8 MG tablet Take 1 tablet (8 mg total) by mouth at bedtime. 21 tablet 0   rivaroxaban (XARELTO) 20 MG TABS tablet Take 1 tablet (20 mg total) by mouth daily with supper. 90 tablet 3   rosuvastatin (CRESTOR) 20 MG tablet Take 1 tablet (20 mg total) by mouth daily. 90 tablet 3   Spacer/Aero-Holding Chambers (AEROCHAMBER PLUS) inhaler Use as instructed 1 each 2   traMADol (ULTRAM) 50 MG tablet Take 50 mg by mouth as needed.     Travoprost, BAK Free, (TRAVATAN) 0.004 % SOLN ophthalmic solution Place 1 drop into both eyes at bedtime. 1 Bottle 5   HYDROcodone-acetaminophen (NORCO/VICODIN) 5-325 MG tablet Take half a tablet at night as needed for pain. (Patient not taking: Reported on 11/16/2023) 7 tablet 0   No facility-administered medications prior to visit.     Allergies:   Chlorhexidine, Naproxen, Oxycodone, Pregabalin, Statins, Amitriptyline hcl, Benazepril hcl, Gabapentin, Oxycodone-acetaminophen, and Propoxyphene   Family History:  The patient's family history includes Cancer in her brother; Heart failure in her mother and sister; Hypertension in her mother; Kidney disease in her daughter; Stroke in her father.    PHYSICAL EXAM:   VS:  BP (!) 120/58   Pulse 72   Ht 5\' 2"  (1.575 m)   Wt 136 lb (61.7 kg)   SpO2 96%   BMI 24.87 kg/m      General: Alert, oriented x3, no distress, healthy left subclavian pacemaker site Head: no evidence of trauma, PERRL, EOMI, no exophtalmos or lid lag, no myxedema, no xanthelasma; normal ears, nose and oropharynx Neck: normal jugular venous pulsations and no hepatojugular reflux; brisk carotid pulses without delay and no carotid bruits Chest: clear to auscultation, no signs of consolidation by percussion or palpation, normal fremitus, symmetrical and full respiratory  excursions Cardiovascular: normal position and quality of the apical impulse, regular rhythm, normal first and paradoxically split second heart sounds, no murmurs, rubs or gallops Abdomen: no  tenderness or distention, no masses by palpation, no abnormal pulsatility or arterial bruits, normal bowel sounds, no hepatosplenomegaly Extremities: no clubbing, cyanosis or edema; 2+ radial, ulnar and brachial pulses bilaterally; 2+ right femoral, posterior tibial and dorsalis pedis pulses; 2+ left femoral, posterior tibial and dorsalis pedis pulses; no subclavian or femoral bruits Neurological: grossly nonfocal Psych: Normal mood and affect     Wt Readings from Last 3 Encounters:  11/16/23 136 lb (61.7 kg)  10/25/23 140 lb (63.5 kg)  08/16/23 132 lb 3.2 oz (60 kg)      Studies/Labs Reviewed:  Cardiac catheterization 11/20/2022:   Prox RCA lesion is 30% stenosed.   Prox LAD lesion is 40% stenosed.   1st Diag lesion is 80% stenosed.   1.  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 2.  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   Recommendations: The results were reviewed with Dr. Shari Prows and medical management will be pursued.  Echocardiogram 11/20/2022: 1. Limited Echo to evaluate for LVEF and RWMA.   2. Limited echo windows, PLAX and SAX. Doppler analysis not performed.   3. Left ventricular ejection fraction, by estimation, is 30 to 35%. The  left ventricle has moderately decreased function. RWMA assessment is  limited. RWMA is present (please see the LV wall scoring below). No apical  windows. Left ventricular diastolic  function could not be evaluated.   Comparison(s): Changes from prior study are noted. LVEF is worse now,  30-35% with RWMA.    Comprehensive pacemaker check performed in the office  today.  See results above.  EKG:    EKG Interpretation Date/Time:  Thursday November 16 2023 09:58:51 EDT Ventricular Rate:  72 PR Interval:    QRS Duration:  138 QT Interval:  454 QTC Calculation: 497 R Axis:   -52  Text Interpretation: Ventricular-paced rhythm When compared with ECG of 21-Nov-2022 14:19, Previous ECG has undetermined rhythm, needs review Confirmed by Jozef Eisenbeis [phone]) on 11/16/2023 10:13:26 AM         Recent Labs: 11/28/2022: Magnesium 2.1 12/28/2022: TSH 3.240 06/27/2023: ALT 10; BUN 11; Creatinine, Ser 0.73; Hemoglobin 12.5; Platelets 232; Potassium 3.6; Sodium 144   Lipid Panel    Component Value Date/Time   CHOL 90 11/24/2022 1215   CHOL 87 (L) 02/12/2021 1159   TRIG 129 11/24/2022 1215   HDL 23 (L) 11/24/2022 1215   HDL 31 (L) 02/12/2021 1159   CHOLHDL 3.9 11/24/2022 1215   VLDL 26 11/24/2022 1215   LDLCALC 41 11/24/2022 1215   LDLCALC 35 02/12/2021 1159   LDLDIRECT 56 06/08/2012 1120     ASSESSMENT:    1. Permanent atrial fibrillation (HCC)   2. Acquired thrombophilia (HCC)   3. Complete heart block (HCC)   4. Pacemaker   5. Dyslipidemia (high LDL; low HDL)   6. Type 2 diabetes mellitus without complication, with long-term current use of insulin (HCC)   7. Essential hypertension   8. Chronic bronchitis, unspecified chronic bronchitis type (HCC)   9. Endocardial cushion defect      PLAN:  In order of problems listed above:  AFib: Permanent arrhythmia.  CHADSVasc 5 (age, gender, heart failure, diabetes mellitus, hypertension).  No strokes or TIAs to date. Anticoagulation: Compliant with anticoagulation.  Has not had falls or bleeding problems. CHF: EF completely recovered following episode of Takotsubo cardiomyopathy. NSVT: Had frequent bursts  of NSVT during the height of Takotsubo cardiomyopathy, none within the next 2 weeks. CHB: Was highly symptomatic with ventricular pacing threshold testing.  Pacemaker dependent PPM: Compliant  with remote downloads which we will continue every 3 months. HLP: Excellent LDL cholesterol on statin, but unfortunately also has a very low HDL cholesterol.  Continue rosuvastatin. DM: Glycemic control has deteriorated, especially during the time she was caring for her son.  Currently on Jardiance and Trulicity as well as insulin.  Focus more on controlling dietary intake of sugars and starches. HTN: Very well-controlled.  No changes in medications. COPD: Currently not complaining of wheezing or shortness of breath. History of congenital heart disease: Has mild-moderate mitral insufficiency and mild-moderate tricuspid insufficiency.  Preserved right and left ventricular systolic function.     Medication Adjustments/Labs and Tests Ordered: Current medicines are reviewed at length with the patient today.  Concerns regarding medicines are outlined above.  Medication changes, Labs and Tests ordered today are listed in the Patient Instructions below. Patient Instructions  Medication Instructions:  No changes *If you need a refill on your cardiac medications before your next appointment, please call your pharmacy*  Follow-Up: At [facility], you and your health needs are our priority.  As part of our continuing mission to provide you with exceptional heart care, our providers are all part of one team.  This team includes your primary Cardiologist (physician) and Advanced Practice Providers or APPs (Physician Assistants and Nurse Practitioners) who all work together to provide you with the care you need, when you need it.  Your next appointment:   1 year(s)- Pacer check  Provider:   Thurmon Fair, MD     We recommend signing up for the patient portal called "MyChart".  Sign up information is provided on this After Visit Summary.  MyChart is used to connect with patients for Virtual Visits (Telemedicine).  Patients are able to view lab/test results, encounter notes, upcoming  appointments, etc.  Non-urgent messages can be sent to your provider as well.   To learn more about what you can do with MyChart, go to [email or web address].        1st Floor: - Lobby - Registration  - Pharmacy  - Lab - Cafe  2nd Floor: - PV Lab - Diagnostic Testing (echo, CT, nuclear med)  3rd Floor: - Vacant  4th Floor: - TCTS (cardiothoracic surgery) - AFib Clinic - Structural Heart Clinic - Vascular Surgery  - Vascular Ultrasound  5th Floor: - HeartCare Cardiology (general and EP) - Clinical Pharmacy for coumadin, hypertension, lipid, weight-loss medications, and med management appointments    Valet parking services will be available as well.      Signed, Thurmon Fair, MD  11/17/2023 9:38 PM    [facility] [address] [place], [place], [place]  [address] Phone: [phone]; Fax: [phone]

## 2023-11-27 ENCOUNTER — Telehealth: Payer: Self-pay | Admitting: Family Medicine

## 2023-11-27 NOTE — Telephone Encounter (Signed)
 Received [employer] form to confirm T2DM treatment for glucometer supplies. Placed into medical records bin for OV notes from 10/25/23 to be included and faxed back.

## 2023-11-28 ENCOUNTER — Encounter: Payer: Self-pay | Admitting: Family Medicine

## 2023-11-28 ENCOUNTER — Ambulatory Visit (INDEPENDENT_AMBULATORY_CARE_PROVIDER_SITE_OTHER): Admitting: Family Medicine

## 2023-11-28 VITALS — BP 118/66 | HR 82 | Wt 138.0 lb

## 2023-11-28 DIAGNOSIS — E039 Hypothyroidism, unspecified: Secondary | ICD-10-CM | POA: Diagnosis not present

## 2023-11-28 DIAGNOSIS — F339 Major depressive disorder, recurrent, unspecified: Secondary | ICD-10-CM | POA: Diagnosis not present

## 2023-11-28 DIAGNOSIS — E119 Type 2 diabetes mellitus without complications: Secondary | ICD-10-CM | POA: Diagnosis not present

## 2023-11-28 DIAGNOSIS — Z0000 Encounter for general adult medical examination without abnormal findings: Secondary | ICD-10-CM

## 2023-11-28 DIAGNOSIS — R2689 Other abnormalities of gait and mobility: Secondary | ICD-10-CM

## 2023-11-28 DIAGNOSIS — E785 Hyperlipidemia, unspecified: Secondary | ICD-10-CM | POA: Diagnosis not present

## 2023-11-28 DIAGNOSIS — G250 Essential tremor: Secondary | ICD-10-CM

## 2023-11-28 DIAGNOSIS — Z794 Long term (current) use of insulin: Secondary | ICD-10-CM

## 2023-11-28 MED ORDER — BUPROPION HCL ER (XL) 300 MG PO TB24: 300.0000 mg | ORAL_TABLET | Freq: Every day | ORAL | 0 refills | Status: DC

## 2023-11-28 NOTE — Patient Instructions (Addendum)
 I will increase your bupropion  dosing to 300 mg -- only take the 300 mg tablet and NOT the 150 mg. Let me know if you have any issues with this. I have also given you some therapy resources on this AVS below. Continue Celexa  at 20 mg daily.   I will send in a referral for physical therapy to help with balance.  You also have an essential tremor that is not uncommon as we age. Let me know if this worsens, and we can talk about therapies as needed.  We will get labs today and I will follow up results with you.  Come back in 2 months to recheck A1c.   Therapy and Counseling Resources Most providers on this list will take Medicaid. Patients with commercial insurance or Medicare should contact their insurance company to get a list of in network providers.  [facility] (takes children) Location 1: [address] [place], [place] [address] Location 2: [address] [place], [place] [address] [phone]   [facility] (spanish speaking therapist available)(habla espanol)(take medicare and medicaid)  [address], [place], [place] [address], [place]  [email or web address] [phone]  [facility] Counseling [address] [place], [place] [address] [phone]  [facility]   [address], [address] [place], [place] [address]      [phone]  [facility] (spanish available) [address]  [place], [place] [address] [phone]  [facility] (take [facility] and medicare) [address]  [place], [place] [address]       [phone]     [facility] (virtual only) [phone]  [facility] [address] [place], [place] [phone]  [facility]:  [address] [place] [phone]  [facility]:  [address] [place] [phone]  [facility] (under & uninsured) [address]    [place] [phone]    [email or web address]    [facility] [address]  [place]    [phone]  [facility] [address]     Phone:  [phone]     [facility]-  [address]  [phone]   [facility] [address]      [place], [place] [phone] ext [phone]  [facility]: [address] [place], [place], [place]  [phone]   [facility] (Spanish therapist) [email or web address]  [address] [place]  [address]   [place] [address]    [phone]    [facility]   [address],  [place], [place]  [phone]   [facility]  [address] [place]  [phone]  [facility]  [address] [place], [place]        [phone]  Open Access/Walk In Clinic under & uninsured  [facility]  [address] [place], [place] [phone] Crisis [phone]  [facility] [address],  (Spanish)   [address] , [place]: [phone]) 8:30 - 12; 1 - 2:30  [facility] [employer],  [address] [place], [place]    ([phone]):8:30 - 12; 2 - 3PM  [facility] [employer],  [address]  [place]; [phone]):   Mon - Fri 8 AM - 5 PM  [facility] [address] [place]  MWF 12:30 to 3:00 or call to schedule an appointment  [phone]  Specific Provider options Psychology Today  [email or web address]/us  click on find a therapist  enter your zip code left side and select or tailor a therapist for your specific need.   [facility] Provider Directory [email or web address]  (Medicaid)   Follow all drop down to find a provider  Social Support program [facility] [phone] or PhotoSolver.pl [address], [place], [place] Recovery support  and educational   24- Hour Availability:   [facility]  [address] [place], [place] [phone] Crisis [phone]  [facility] [phone]  [facility]  [phone]   [facility]  [phone] (after hours)  Therapeutic Alternative/Mobile Crisis   [phone]  [place]  National Suicide Hotline  ([phone])[phone] Derrel Flies)  Call 911 or go to emergency room  [facility]  [phone]);  [place] and [employer]  [phone]); [place], [place], [place], [place], [place], [place], [place]

## 2023-11-28 NOTE — Assessment & Plan Note (Signed)
 Last TSH a year ago in range.  Will recheck today and make adjustments to levothyroxine  accordingly.

## 2023-11-28 NOTE — Assessment & Plan Note (Signed)
 A1c elevated at 9.1 at last visit.  Patient is continuing her dietary modifications.  Continue current medication regimen and recheck A1c in 2 months.

## 2023-11-28 NOTE — Assessment & Plan Note (Signed)
 Previous lipid panel within normal limits 1 year ago.  Will recheck lipid panel today.

## 2023-11-28 NOTE — Progress Notes (Signed)
    SUBJECTIVE:   CHIEF COMPLAINT / HPI:   Depression Put on increased dose of Celexa  at last visit but experienced dizziness when she was told to stop.  She has been continuing the Celexa  20 mg and bupropion  150 mg daily.  T2DM A1c elevated at 9.1 at last visit.  Has been working on dietary indiscretions and has been working on using medications consistently.  Hypothyroidism Has been taking levothyroxine  88 mcg daily without issue.  Hyperlipidemia Has been taking Crestor  20 mg daily without issue.  PERTINENT  PMH / PSH: Complete heart block and pacemaker dependent, Takotsubo cardiomyopathy, HFpEF, persistent A-fib, COPD, hypothyroidism, OA, HLD  OBJECTIVE:   BP 118/66   Pulse 82   Wt 138 lb (62.6 kg)   SpO2 96%   BMI 25.24 kg/m   General: Alert and oriented, in NAD Skin: Warm, dry, and intact without lesions HEENT: NCAT, EOM grossly normal, midline nasal septum Cardiac: RRR, no m/r/g appreciated Respiratory: CTAB, breathing and speaking comfortably on RA Abdominal: Soft, nontender, nondistended, normoactive bowel sounds Extremities: Moves all extremities grossly equally Neurological: No gross focal deficit Psychiatric: Appropriate mood and affect, PHQ=13 with negative #9  ASSESSMENT/PLAN:   Assessment & Plan Depression, recurrent (HCC) Given intolerance to increased dose of Celexa , will continue previous dose of Celexa  20 mg daily.  Will increase bupropion  to 300 mg daily and assess response. Hypothyroidism, unspecified type Last TSH a year ago in range.  Will recheck today and make adjustments to levothyroxine  accordingly. Type 2 diabetes mellitus without complication, with long-term current use of insulin  (HCC) A1c elevated at 9.1 at last visit.  Patient is continuing her dietary modifications.  Continue current medication regimen and recheck A1c in 2 months. Dyslipidemia (high LDL; low HDL) Previous lipid panel within normal limits 1 year ago.  Will recheck lipid  panel today. Healthcare maintenance    Genetta Kenning, MD [facility]

## 2023-11-28 NOTE — Assessment & Plan Note (Signed)
 PHQ-9 mildly improved.  Reassured by negative #9.  Given intolerance to increased dose of Celexa , will continue previous dose of Celexa  20 mg daily.  Will increase bupropion  to 300 mg daily and assess response.  Also provided therapy resources.

## 2023-11-29 ENCOUNTER — Encounter: Payer: Self-pay | Admitting: Family Medicine

## 2023-11-29 DIAGNOSIS — Z0000 Encounter for general adult medical examination without abnormal findings: Secondary | ICD-10-CM | POA: Insufficient documentation

## 2023-11-29 DIAGNOSIS — R2689 Other abnormalities of gait and mobility: Secondary | ICD-10-CM | POA: Insufficient documentation

## 2023-11-29 DIAGNOSIS — G250 Essential tremor: Secondary | ICD-10-CM | POA: Insufficient documentation

## 2023-11-29 HISTORY — DX: Other abnormalities of gait and mobility: R26.89

## 2023-11-29 LAB — LIPID PANEL
Chol/HDL Ratio: 3 ratio (ref 0.0–4.4)
Cholesterol, Total: 139 mg/dL (ref 100–199)
HDL: 46 mg/dL (ref >39)
LDL Chol Calc (NIH): 53 mg/dL (ref 0–99)
Triglycerides: 253 mg/dL — ABNORMAL HIGH (ref 0–149)
VLDL Cholesterol Cal: 40 mg/dL (ref 5–40)

## 2023-11-29 LAB — TSH RFX ON ABNORMAL TO FREE T4: TSH: 11.9 u[IU]/mL — ABNORMAL HIGH (ref 0.450–4.500)

## 2023-11-29 LAB — T4F: T4,Free (Direct): 1.05 ng/dL (ref 0.82–1.77)

## 2023-11-29 MED ORDER — LEVOTHYROXINE SODIUM 112 MCG PO TABS: 112.0000 ug | ORAL_TABLET | ORAL | 3 refills | Status: DC

## 2023-11-29 NOTE — Assessment & Plan Note (Signed)
 Will send message to front office staff to get her scheduled for Medicare annual wellness visit.

## 2023-11-29 NOTE — Assessment & Plan Note (Addendum)
 Worsening over time.  She uses her walker now more.  Reassured by my observations today.  Will refer to physical therapy given improvement in the past.  Also encouraged continued hydration to reduce symptoms of orthostasis.  BP looks okay today.

## 2023-11-29 NOTE — Addendum Note (Signed)
 Addended by: Dema Filler B on: 11/29/2023 05:26 PM   Modules accepted: Orders

## 2023-11-29 NOTE — Assessment & Plan Note (Signed)
 Given mild nature at this time, decided to hold off on pharmacologic therapy.  However, if symptoms get worse, consider propranolol  for symptom relief.

## 2023-11-29 NOTE — Progress Notes (Signed)
 TSH returned elevated at 11.  Will increase patient's levothyroxine  from 88 mcg to 112 mcg daily.  Reassuringly patient with normal coronary arteries in 2011.  Sent message to patient regarding change and to let me know if she develop any chest pain while on the increased dose.  Plan to recheck TSH at next visit.

## 2023-12-04 ENCOUNTER — Telehealth: Payer: Self-pay | Admitting: Family Medicine

## 2023-12-04 NOTE — Telephone Encounter (Signed)
 Attempted to call patient to discuss recent test results given unread MyChart message.  Left voicemail to call back earliest convenience.  TSH elevated with normal T4; however, with her history of depression, feel titrating levothyroxine  dose could help clinically.  When patient calls back, please inform her that her thyroid  function is higher than normal and that I will increase her levothyroxine  from 88 mcg to 112 mcg/day.  Please keep us  updated if she has any chest pain, shortness of breath, or other side effects.

## 2023-12-14 ENCOUNTER — Telehealth: Payer: Self-pay

## 2023-12-14 MED ORDER — BENZONATATE 100 MG PO CAPS: 100.0000 mg | ORAL_CAPSULE | Freq: Three times a day (TID) | ORAL | 0 refills | Status: DC | PRN

## 2023-12-14 NOTE — Telephone Encounter (Signed)
 Patient calls nurse line reporting cough and phlegm.  She reports symptoms started 1 week ago with "constant" coughing and "green" mucous production.  She denies any fevers or chills. She denies any shortness of breath.   She reports she has been taking her allergy medication and using a humidifier, however the cough is keeping her up at night.   She is requesting a cough medication, she reports she is afraid to purchase one on her own with all of her other medications.   Advised warm fluids with honey.  Patient reports she has transportation issues.   Will forward to PCP for advisement.   Precautions discussed.

## 2023-12-14 NOTE — Telephone Encounter (Signed)
 Called patient.  Advised of PCP recommendations and advised of cough medication to her pharmacy.   I stressed the importance of an in person visit. She reports she does not have transportation until Tuesday.  Patient scheduled for Tuesday afternoon for evaluation.   Advised if she is able to find a ride for tomorrow I would get her in.   Strict ED precautions given to patient.

## 2023-12-14 NOTE — Telephone Encounter (Signed)
 Given patient's comorbidities and COPD, would much prefer she be seen in person for assessment of COPD exacerbation. She may need antibiotics and steroids. Please let her know the importance of being seen promptly.  In the meantime, I will send in tessalon perles to help with cough. This drug should not have significant medication interactions.

## 2023-12-19 ENCOUNTER — Encounter: Payer: Self-pay | Admitting: Student

## 2023-12-19 ENCOUNTER — Ambulatory Visit (INDEPENDENT_AMBULATORY_CARE_PROVIDER_SITE_OTHER): Admitting: Student

## 2023-12-19 VITALS — BP 129/60 | HR 64 | Temp 97.9°F | Ht 62.0 in | Wt 135.2 lb

## 2023-12-19 DIAGNOSIS — B349 Viral infection, unspecified: Secondary | ICD-10-CM | POA: Diagnosis not present

## 2023-12-19 MED ORDER — ONDANSETRON HCL 4 MG PO TABS: 4.0000 mg | ORAL_TABLET | Freq: Three times a day (TID) | ORAL | 0 refills | Status: AC | PRN

## 2023-12-19 MED ORDER — GUAIFENESIN ER 600 MG PO TB12: 600.0000 mg | ORAL_TABLET | Freq: Two times a day (BID) | ORAL | 0 refills | Status: AC

## 2023-12-19 NOTE — Patient Instructions (Addendum)
 Pleasure to meet you today.  Suspect your symptoms are most likely due to ongoing viral  illness and gastroenteritis.  I recommend making sure you stay hydrated with enough water.  Also you can do Tylenol  or ibuprofen for any aches and pain with the cough.  Encourage use of warm water, honey and lemon to help with the cough.  I have sent in prescription for Mucinex  which you should take twice daily for at least 7 days.  I have also sent in prescription for Zofran  to help with your nausea and vomiting.  Please return or go to the ED should your symptoms get worse or should you start developing fevers.

## 2023-12-19 NOTE — Progress Notes (Signed)
    SUBJECTIVE:   CHIEF COMPLAINT / HPI:   - 78 year old female history of HFpEF, hypothyroidism, T2DM - Presenting today for concerns of generalized weakness and cough - Symptoms started a week ago - Other associated symptoms include nausea, vomiting, diarrhea and decreased appetite. - Has had emesis x 3 daily since onset of symptoms and multiple bout of loose stool a day - Denies any fevers or chills. - Reports known contact and son-in-law who had similar symptoms of cough, nausea, vomiting and diarrhea. - Endorses chest pain worse with cough. Non radiating and mom exertional. - Previously tried Tessalon  Perles but does not like how it makes her feel.  PERTINENT  PMH / PSH: Reviewed   OBJECTIVE:   BP 129/60   Pulse 64   Temp 97.9 F (36.6 C) (Oral)   Ht 5\' 2"  (1.575 m)   Wt 135 lb 4 oz (61.3 kg)   SpO2 99%   BMI 24.74 kg/m    Physical Exam General: Alert, well appearing, NAD Cardiovascular: RRR, No Murmurs, Normal S2/S2 Respiratory: CTAB, No wheezing or Rales Abdomen: No distension or tenderness Extremities: No edema on extremities   MSK: Mild tenderness over the sternum    ASSESSMENT/PLAN:   Viral illness 78 y.o. year old presents with Cough,N/V/D. She is afebrile today and on exam has good work of breathing on RA, clear breath sounds bilaterally and reproducible chest pain suspected to be from persistent cough. Overall presentation and exam is consistent with viral infection vs Food poisoning.    - Discussed conservative management with warm water, honey and lemon. - Encouraged adequate hydration for patient.  -Rx Zofran  and Mucinex , discontinue Tessalon  Perles - Outline signs and symptoms that will warrant ED visit or return for further assessment.    Goble Last, MD [facility]

## 2023-12-20 ENCOUNTER — Other Ambulatory Visit: Payer: Self-pay

## 2023-12-20 MED ORDER — EMPAGLIFLOZIN 25 MG PO TABS: 25.0000 mg | ORAL_TABLET | Freq: Every day | ORAL | 3 refills | Status: AC

## 2023-12-21 ENCOUNTER — Other Ambulatory Visit: Payer: Self-pay | Admitting: Family Medicine

## 2023-12-29 ENCOUNTER — Other Ambulatory Visit: Payer: Self-pay | Admitting: Family Medicine

## 2024-01-10 ENCOUNTER — Ambulatory Visit (INDEPENDENT_AMBULATORY_CARE_PROVIDER_SITE_OTHER): Admitting: Podiatry

## 2024-01-10 DIAGNOSIS — M79674 Pain in right toe(s): Secondary | ICD-10-CM

## 2024-01-10 DIAGNOSIS — Z794 Long term (current) use of insulin: Secondary | ICD-10-CM

## 2024-01-10 DIAGNOSIS — M79675 Pain in left toe(s): Secondary | ICD-10-CM

## 2024-01-10 DIAGNOSIS — B351 Tinea unguium: Secondary | ICD-10-CM

## 2024-01-10 DIAGNOSIS — E119 Type 2 diabetes mellitus without complications: Secondary | ICD-10-CM

## 2024-01-10 NOTE — Progress Notes (Signed)
This patient returns to my office for at risk foot care.  This patient requires this care by a professional since this patient will be at risk due to having type 2 diabetes.  This patient is unable to cut nails herself since the patient cannot reach her nails.These nails are painful walking and wearing shoes.  This patient presents for at risk foot care today.  General Appearance  Alert, conversant and in no acute stress.  Vascular  Dorsalis pedis and posterior tibial  pulses are palpable  bilaterally.  Capillary return is within normal limits  bilaterally. Temperature is within normal limits  bilaterally.  Neurologic  Senn-Weinstein monofilament wire test within normal limits  bilaterally. Muscle power within normal limits bilaterally.  Nails Thick disfigured discolored nails with subungual debris  from hallux to fifth toes bilaterally. No evidence of bacterial infection or drainage bilaterally.  Orthopedic  No limitations of motion  feet .  No crepitus or effusions noted.  No bony pathology or digital deformities noted.  Midfoot arthritis.  Skin  normotropic skin with no porokeratosis noted bilaterally.  No signs of infections or ulcers noted.     Onychomycosis  Pain in right toes  Pain in left toes  Consent was obtained for treatment procedures.   Mechanical debridement of nails 1-5  bilaterally performed with a nail nipper.  Filed with dremel without incident.     Return office visit    3 months                  Told patient to return for periodic foot care and evaluation due to potential at risk complications.   Gardiner Barefoot DPM

## 2024-01-19 ENCOUNTER — Telehealth: Payer: Self-pay

## 2024-01-19 NOTE — Telephone Encounter (Signed)
 Patient calls nurse line requesting to speak with PCP.   She reports an adverse reaction to Trulicity . She reports for ~ 1 month she has noticed bilateral leg swelling each week she injects.   She reports the swelling starts immediately after injection and lasts ~ 5 days. She reports she feels fatigues and overall unwell during this time period.   She denies any chest pains, dizziness or shortness of breath. She denies any pain.   Patient has been on this medication for some months without noting an adverse reaction.   Patient advised to schedule an apt with PCP to discuss further.   Patient scheduled for 6/18 with PCP.   ED precautions discussed with patient in the meantime.

## 2024-01-24 ENCOUNTER — Ambulatory Visit: Admitting: Family Medicine

## 2024-01-24 VITALS — BP 130/79 | HR 74 | Temp 98.2°F | Wt 139.2 lb

## 2024-01-24 DIAGNOSIS — T8090XA Unspecified complication following infusion and therapeutic injection, initial encounter: Secondary | ICD-10-CM | POA: Diagnosis not present

## 2024-01-24 MED ORDER — MOUNJARO 2.5 MG/0.5ML ~~LOC~~ SOAJ: 2.5000 mg | SUBCUTANEOUS | 0 refills | Status: DC

## 2024-01-24 NOTE — Patient Instructions (Signed)
 I will try to figure out your best regimen for your diabetes. I will likely send in another medication like Trulicity . Let me know how you do with this information.

## 2024-01-24 NOTE — Progress Notes (Addendum)
    SUBJECTIVE:   CHIEF COMPLAINT / HPI:   Leg swelling after trulicity  1.5 month history. Came on all of a sudden. Injection sites are swollen, red, and painful after each injection. She has not changed her pharmacy or any hygienic practices with her injections. This happened with her metformin  too, so she had to stop taking that.  PERTINENT  PMH / PSH: T2DM  OBJECTIVE:   BP 130/79   Pulse 74   Temp 98.2 F (36.8 C)   Wt 139 lb 3.2 oz (63.1 kg)   SpO2 94%   BMI 25.46 kg/m   General: Alert and oriented, in NAD Skin: Warm, dry, and intact without lesions HEENT: NCAT, EOM grossly normal, midline nasal septum Respiratory: Breathing and speaking comfortably on RA Extremities: Moves all extremities grossly equally Neurological: No gross focal deficit Psychiatric: Appropriate mood and affect  ASSESSMENT/PLAN:   Assessment & Plan Injection site reaction, initial encounter In the setting of trulicity  administration. Unknown why she has developed this reaction now. She is overall well-appearing on exam. Given her need for T2DM control, advised I will go ahead and switch therapy to Mounjaro and assess injection site response. Will need A1c recheck at next visit given time constraints today.   Mercedes Kenning, MD [facility]

## 2024-01-25 ENCOUNTER — Other Ambulatory Visit: Payer: Self-pay | Admitting: Family Medicine

## 2024-01-27 ENCOUNTER — Other Ambulatory Visit: Payer: Self-pay | Admitting: Family Medicine

## 2024-01-27 DIAGNOSIS — R1013 Epigastric pain: Secondary | ICD-10-CM

## 2024-02-03 ENCOUNTER — Other Ambulatory Visit: Payer: Self-pay | Admitting: Family Medicine

## 2024-02-03 DIAGNOSIS — J411 Mucopurulent chronic bronchitis: Secondary | ICD-10-CM

## 2024-02-15 ENCOUNTER — Other Ambulatory Visit: Payer: Self-pay | Admitting: Family Medicine

## 2024-02-19 ENCOUNTER — Other Ambulatory Visit: Payer: Self-pay | Admitting: Family Medicine

## 2024-02-19 DIAGNOSIS — I10 Essential (primary) hypertension: Secondary | ICD-10-CM

## 2024-02-20 ENCOUNTER — Other Ambulatory Visit: Payer: Self-pay | Admitting: Family Medicine

## 2024-02-28 ENCOUNTER — Ambulatory Visit (INDEPENDENT_AMBULATORY_CARE_PROVIDER_SITE_OTHER)

## 2024-02-28 DIAGNOSIS — I442 Atrioventricular block, complete: Secondary | ICD-10-CM

## 2024-02-28 LAB — CUP PACEART REMOTE DEVICE CHECK
Battery Impedance: 889 Ohm
Battery Remaining Longevity: 72 mo
Battery Voltage: 2.77 V
Brady Statistic RV Percent Paced: 99 %
Date Time Interrogation Session: 20250723070112
Implantable Lead Connection Status: 753985
Implantable Lead Connection Status: 753985
Implantable Lead Implant Date: 19990203
Implantable Lead Implant Date: 19990203
Implantable Lead Location: 753859
Implantable Lead Location: 753860
Implantable Lead Model: 5068
Implantable Lead Model: 5092
Implantable Pulse Generator Implant Date: 20180711
Lead Channel Impedance Value: 654 Ohm
Lead Channel Impedance Value: 67 Ohm
Lead Channel Pacing Threshold Amplitude: 0.875 V
Lead Channel Pacing Threshold Pulse Width: 0.4 ms
Lead Channel Setting Pacing Amplitude: 2.5 V
Lead Channel Setting Pacing Pulse Width: 0.4 ms
Lead Channel Setting Sensing Sensitivity: 4 mV
Zone Setting Status: 755011
Zone Setting Status: 755011

## 2024-03-06 ENCOUNTER — Telehealth: Payer: Self-pay

## 2024-03-06 ENCOUNTER — Telehealth: Payer: Self-pay | Admitting: *Deleted

## 2024-03-06 ENCOUNTER — Ambulatory Visit: Admitting: Family Medicine

## 2024-03-06 VITALS — BP 142/70 | HR 69 | Wt 139.0 lb

## 2024-03-06 DIAGNOSIS — M542 Cervicalgia: Secondary | ICD-10-CM | POA: Diagnosis not present

## 2024-03-06 DIAGNOSIS — M7741 Metatarsalgia, right foot: Secondary | ICD-10-CM

## 2024-03-06 DIAGNOSIS — M7742 Metatarsalgia, left foot: Secondary | ICD-10-CM

## 2024-03-06 DIAGNOSIS — F339 Major depressive disorder, recurrent, unspecified: Secondary | ICD-10-CM | POA: Diagnosis not present

## 2024-03-06 DIAGNOSIS — Z0000 Encounter for general adult medical examination without abnormal findings: Secondary | ICD-10-CM

## 2024-03-06 DIAGNOSIS — Z794 Long term (current) use of insulin: Secondary | ICD-10-CM | POA: Diagnosis not present

## 2024-03-06 DIAGNOSIS — E119 Type 2 diabetes mellitus without complications: Secondary | ICD-10-CM

## 2024-03-06 DIAGNOSIS — I10 Essential (primary) hypertension: Secondary | ICD-10-CM | POA: Diagnosis not present

## 2024-03-06 DIAGNOSIS — R2689 Other abnormalities of gait and mobility: Secondary | ICD-10-CM

## 2024-03-06 DIAGNOSIS — E039 Hypothyroidism, unspecified: Secondary | ICD-10-CM

## 2024-03-06 LAB — POCT GLYCOSYLATED HEMOGLOBIN (HGB A1C): HbA1c, POC (controlled diabetic range): 8.1 % — AB (ref 0.0–7.0)

## 2024-03-06 LAB — D-DIMER, QUANTITATIVE: D-DIMER: 0.46 mg{FEU}/L (ref 0.00–0.49)

## 2024-03-06 MED ORDER — MOUNJARO 5 MG/0.5ML ~~LOC~~ SOAJ: 5.0000 mg | SUBCUTANEOUS | 0 refills | Status: DC

## 2024-03-06 NOTE — Telephone Encounter (Signed)
 [name] [phone]) from [facility] calls.  The original order was for a doppler to look for a DVT, but order was changed to look at soft tissue as well.  The issue is that the current order will not show a DVT.  [name] said that you could call her for clarifying questions or the Radiologist # [phone]  Harlene Carte, CMA

## 2024-03-06 NOTE — Assessment & Plan Note (Signed)
 Update TSH today. Continue synthroid  112 mcg daily.

## 2024-03-06 NOTE — Telephone Encounter (Signed)
 Patient calls nurse line in regards to scheduling imaging.   She reports she was told someone would call her to schedule.   Will forward to PCP to make sure order is in and correct (see below) before scheduling patient.

## 2024-03-06 NOTE — Telephone Encounter (Signed)
 Per [employer] automated system, no PA required. Harlene Carte, CMA

## 2024-03-06 NOTE — Assessment & Plan Note (Signed)
 MAW visit scheduled.

## 2024-03-06 NOTE — Assessment & Plan Note (Signed)
 Continue bupropion  300 mg and celexa  20 mg daily.

## 2024-03-06 NOTE — Assessment & Plan Note (Addendum)
 Just barely uncontrolled today with systolic of 142, likely in the setting of relative neck pain. Continue losartan , coreg  for now and recheck at subsequent visits.

## 2024-03-06 NOTE — Patient Instructions (Addendum)
 I have ordered an ultrasound for your neck. Take tylenol  and voltaren  for now.  You can try these metatarsal pads for your feet:   We are updating thyroid  labs today. We are also obtaining a d-dimer to check for signs of clotting.  Your A1c looks great! Keep up the great work! I will increase the dose of mounjaro  to 5 mg weekly.

## 2024-03-06 NOTE — Assessment & Plan Note (Addendum)
 A1c today 8.1 from 9.1 previously. Congratulated on excellent work. Continue Mounjaro , Jardiance , LAI 40 u daily, and SAI 12 u BID with meals.

## 2024-03-06 NOTE — Progress Notes (Unsigned)
    SUBJECTIVE:   CHIEF COMPLAINT / HPI:   Hypothyroidism Increased synthroid  to 112 mcg daily at last visit in April 2025.  T2DM Has been taking medications consistently and limiting dietary indiscretions. At last visit, she was switched to Mounjaro  given concerns for injection site reactions with Trulicity .  Depression On Celexa  20 mg and bupropion  300 mg daily. She has *** gotten into therapy.  Balance concern Has *** been in contact with PT.  Throat/neck pain ***  PERTINENT  PMH / PSH: CAD, hx takotsubo with recovered EF, PAF and ventricular ectopy with pacemaker - followed with cardiology April 2025  OBJECTIVE:   There were no vitals taken for this visit.  General: Alert and oriented, in NAD Skin: Warm, dry, and intact without lesions HEENT: NCAT, EOM grossly normal, midline nasal septum Cardiac: RRR, no m/r/g appreciated Respiratory: CTAB, breathing and speaking comfortably on RA Abdominal: Soft, nontender, nondistended, normoactive bowel sounds Extremities: Moves all extremities grossly equally Neurological: No gross focal deficit Psychiatric: Appropriate mood and affect   ASSESSMENT/PLAN:   Assessment & Plan Type 2 diabetes mellitus without complication, with long-term current use of insulin  (HCC) A1c today 8.1 from 9.1 previously. Congratulated on excellent work. Continue Mounjaro , Jardiance , LAI 40 u daily, and SAI 12 u BID with meals. Depression, recurrent (HCC) Continue bupropion  300 mg and celexa  20 mg daily. Essential hypertension Controlled***. Continue losartan , coreg . Hypothyroidism, unspecified type Update TSH today. Continue synthroid  112 mcg daily. Healthcare maintenance MAW visit scheduled.   Metatarsalgia Neck pain - dvt us  with soft tissue, d dimer HHPT  Stuart Redo, MD [facility]

## 2024-03-06 NOTE — Telephone Encounter (Signed)
 Spoke to patient and the only time that she can go to get CT is after 3 pm because she relies on her son for transportation.  He works until 3 daily.  Called [facility] and the only time frame that they have available is in [place] on:  03/08/2024 [address] [facility] 1540 with an arrival of 1520  Scheduled appointment tentatively and called patient back with info to see if she can make it.  [facility] has a no show fee of $75 if not cancelled within 24 hours prior to exam.  I will cancel appointment if patient is not agreeable to time.  Will need to regroup with Dr. Tharon to see what he suggests going forward.    [name], CMA

## 2024-03-07 ENCOUNTER — Ambulatory Visit: Payer: Self-pay | Admitting: Family Medicine

## 2024-03-07 ENCOUNTER — Other Ambulatory Visit: Payer: Self-pay | Admitting: Family Medicine

## 2024-03-07 DIAGNOSIS — M542 Cervicalgia: Secondary | ICD-10-CM

## 2024-03-07 LAB — TSH: TSH: 0.082 u[IU]/mL — ABNORMAL LOW (ref 0.450–4.500)

## 2024-03-07 MED ORDER — LEVOTHYROXINE SODIUM 100 MCG PO TABS: 100.0000 ug | ORAL_TABLET | ORAL | 3 refills | Status: DC

## 2024-03-07 NOTE — Assessment & Plan Note (Signed)
 We will send in referral for home health PT to help increase access to therapy resources.

## 2024-03-07 NOTE — Progress Notes (Signed)
 Will switch synthroid  dosing to 100 mcg daily given lower TSH. She does have some pain with her left neck that is being worked up; however, feel that her degree of decrease to 0.082 unlikely to be euthyroid sick syndrome.  Shelly, when you chat with her about scheduling her CT, can you let her know we made this change? STOP taking synthroid  112 mcg and TAKE 100 mcg.

## 2024-03-07 NOTE — Telephone Encounter (Signed)
 Patient can not make appointment at [facility] location.  She only wants a [facility] location.  Called [facility] and cancelled so that patient would not be charged a $75 no show fee.  Dr. Tharon, please advise as to next step.  [name], CMA

## 2024-03-08 ENCOUNTER — Ambulatory Visit
Admission: RE | Admit: 2024-03-08 | Discharge: 2024-03-08 | Disposition: A | Discharge status: Home / Self Care | Source: Ambulatory Visit | Attending: Family Medicine | Admitting: Family Medicine

## 2024-03-08 ENCOUNTER — Other Ambulatory Visit

## 2024-03-08 DIAGNOSIS — M542 Cervicalgia: Secondary | ICD-10-CM

## 2024-03-11 ENCOUNTER — Ambulatory Visit: Payer: Self-pay | Admitting: Family Medicine

## 2024-03-11 ENCOUNTER — Ambulatory Visit: Payer: Self-pay | Admitting: Cardiovascular Disease

## 2024-03-11 NOTE — Progress Notes (Signed)
 Called patient to discuss US  DVT and soft tissue results. No concern for infection, mass, or DVT. She states her symptoms have greatly improved since seeing me. She will keep me updated if the symptoms recur.  She notes she got her ankle caught in the screen door over the weekend, and it bled a lot due to her being on blood thinners. However, her family helped her get it under control. She will keep me updated if this worsens with increasing redness, warmth, pain, or any fever.

## 2024-03-27 ENCOUNTER — Other Ambulatory Visit: Payer: Self-pay | Admitting: Family Medicine

## 2024-04-10 ENCOUNTER — Encounter: Payer: Self-pay | Admitting: Podiatry

## 2024-04-10 ENCOUNTER — Ambulatory Visit (INDEPENDENT_AMBULATORY_CARE_PROVIDER_SITE_OTHER): Admitting: Podiatry

## 2024-04-10 DIAGNOSIS — M79674 Pain in right toe(s): Secondary | ICD-10-CM

## 2024-04-10 DIAGNOSIS — Z794 Long term (current) use of insulin: Secondary | ICD-10-CM | POA: Diagnosis not present

## 2024-04-10 DIAGNOSIS — B351 Tinea unguium: Secondary | ICD-10-CM | POA: Diagnosis not present

## 2024-04-10 DIAGNOSIS — M79675 Pain in left toe(s): Secondary | ICD-10-CM | POA: Diagnosis not present

## 2024-04-10 DIAGNOSIS — E119 Type 2 diabetes mellitus without complications: Secondary | ICD-10-CM | POA: Diagnosis not present

## 2024-04-10 NOTE — Progress Notes (Signed)
This patient returns to my office for at risk foot care.  This patient requires this care by a professional since this patient will be at risk due to having type 2 diabetes.  This patient is unable to cut nails herself since the patient cannot reach her nails.These nails are painful walking and wearing shoes.  This patient presents for at risk foot care today.  General Appearance  Alert, conversant and in no acute stress.  Vascular  Dorsalis pedis and posterior tibial  pulses are palpable  bilaterally.  Capillary return is within normal limits  bilaterally. Temperature is within normal limits  bilaterally.  Neurologic  Senn-Weinstein monofilament wire test within normal limits  bilaterally. Muscle power within normal limits bilaterally.  Nails Thick disfigured discolored nails with subungual debris  from hallux to fifth toes bilaterally. No evidence of bacterial infection or drainage bilaterally.  Orthopedic  No limitations of motion  feet .  No crepitus or effusions noted.  No bony pathology or digital deformities noted.  Midfoot arthritis.  Skin  normotropic skin with no porokeratosis noted bilaterally.  No signs of infections or ulcers noted.     Onychomycosis  Pain in right toes  Pain in left toes  Consent was obtained for treatment procedures.   Mechanical debridement of nails 1-5  bilaterally performed with a nail nipper.  Filed with dremel without incident.     Return office visit    3 months                  Told patient to return for periodic foot care and evaluation due to potential at risk complications.   Gardiner Barefoot DPM

## 2024-04-17 ENCOUNTER — Ambulatory Visit: Admitting: Podiatry

## 2024-04-20 ENCOUNTER — Encounter (HOSPITAL_COMMUNITY): Payer: Self-pay | Admitting: *Deleted

## 2024-04-20 ENCOUNTER — Other Ambulatory Visit: Payer: Self-pay

## 2024-04-20 ENCOUNTER — Emergency Department (HOSPITAL_COMMUNITY)
Admission: EM | Admit: 2024-04-20 | Discharge: 2024-04-20 | Disposition: A | Discharge status: Home / Self Care | Attending: Emergency Medicine | Admitting: Emergency Medicine

## 2024-04-20 ENCOUNTER — Emergency Department (HOSPITAL_COMMUNITY)

## 2024-04-20 ENCOUNTER — Ambulatory Visit (HOSPITAL_COMMUNITY)
Admission: EM | Admit: 2024-04-20 | Discharge: 2024-04-20 | Disposition: A | Discharge status: Home / Self Care | Attending: Family Medicine | Admitting: Family Medicine

## 2024-04-20 ENCOUNTER — Encounter (HOSPITAL_COMMUNITY): Payer: Self-pay

## 2024-04-20 DIAGNOSIS — R0789 Other chest pain: Secondary | ICD-10-CM | POA: Diagnosis not present

## 2024-04-20 DIAGNOSIS — R109 Unspecified abdominal pain: Secondary | ICD-10-CM | POA: Diagnosis present

## 2024-04-20 LAB — CBC WITH DIFFERENTIAL/PLATELET
Abs Immature Granulocytes: 0.01 K/uL (ref 0.00–0.07)
Basophils Absolute: 0 K/uL (ref 0.0–0.1)
Basophils Relative: 1 %
Eosinophils Absolute: 0.2 K/uL (ref 0.0–0.5)
Eosinophils Relative: 4 %
HCT: 46 % (ref 36.0–46.0)
Hemoglobin: 14.7 g/dL (ref 12.0–15.0)
Immature Granulocytes: 0 %
Lymphocytes Relative: 46 %
Lymphs Abs: 2 K/uL (ref 0.7–4.0)
MCH: 26.7 pg (ref 26.0–34.0)
MCHC: 32 g/dL (ref 30.0–36.0)
MCV: 83.6 fL (ref 80.0–100.0)
Monocytes Absolute: 0.4 K/uL (ref 0.1–1.0)
Monocytes Relative: 9 %
Neutro Abs: 1.7 K/uL (ref 1.7–7.7)
Neutrophils Relative %: 40 %
Platelets: 204 K/uL (ref 150–400)
RBC: 5.5 MIL/uL — ABNORMAL HIGH (ref 3.87–5.11)
RDW: 12.5 % (ref 11.5–15.5)
WBC: 4.3 K/uL (ref 4.0–10.5)
nRBC: 0 % (ref 0.0–0.2)

## 2024-04-20 LAB — COMPREHENSIVE METABOLIC PANEL WITH GFR
ALT: 30 U/L (ref 0–44)
AST: 27 U/L (ref 15–41)
Albumin: 4.2 g/dL (ref 3.5–5.0)
Alkaline Phosphatase: 58 U/L (ref 38–126)
Anion gap: 12 (ref 5–15)
BUN: 5 mg/dL — ABNORMAL LOW (ref 8–23)
CO2: 25 mmol/L (ref 22–32)
Calcium: 9.5 mg/dL (ref 8.9–10.3)
Chloride: 105 mmol/L (ref 98–111)
Creatinine, Ser: 0.75 mg/dL (ref 0.44–1.00)
GFR, Estimated: >60 mL/min (ref >60)
Glucose, Bld: 135 mg/dL — ABNORMAL HIGH (ref 70–99)
Potassium: 3.4 mmol/L — ABNORMAL LOW (ref 3.5–5.1)
Sodium: 143 mmol/L (ref 135–145)
Total Bilirubin: 0.6 mg/dL (ref 0.0–1.2)
Total Protein: 7.4 g/dL (ref 6.5–8.1)

## 2024-04-20 LAB — URINALYSIS, ROUTINE W REFLEX MICROSCOPIC
Bacteria, UA: NONE SEEN
Bilirubin Urine: NEGATIVE
Glucose, UA: NEGATIVE mg/dL
Hgb urine dipstick: NEGATIVE
Ketones, ur: NEGATIVE mg/dL
Nitrite: NEGATIVE
Protein, ur: 30 mg/dL — AB
Specific Gravity, Urine: 1.023 (ref 1.005–1.030)
pH: 5 (ref 5.0–8.0)

## 2024-04-20 LAB — LIPASE, BLOOD: Lipase: 13 U/L (ref 11–51)

## 2024-04-20 MED ORDER — METHOCARBAMOL 500 MG PO TABS
500.0000 mg | ORAL_TABLET | Freq: Once | ORAL | Status: AC
  Administered 2024-04-20: 500 mg via ORAL
  Filled 2024-04-20: qty 1

## 2024-04-20 MED ORDER — HYDROCODONE-ACETAMINOPHEN 5-325 MG PO TABS: 1.0000 | ORAL_TABLET | Freq: Four times a day (QID) | ORAL | 0 refills | Status: DC | PRN

## 2024-04-20 MED ORDER — METHOCARBAMOL 500 MG PO TABS: 500.0000 mg | ORAL_TABLET | Freq: Three times a day (TID) | ORAL | 0 refills | Status: DC | PRN

## 2024-04-20 NOTE — ED Provider Notes (Signed)
 [place] EMERGENCY DEPARTMENT AT [facility] Provider Note   CSN: [id number] Arrival date & time: 04/20/24  1149     Patient presents with: Flank Pain (Pt reports right side flank pain since Thursday while doing her regular daily activities. Denies any urinary symptoms, history of kidney stones, or fevers. )   Mercedes Barr is a 78 y.o. female.    Flank Pain  Patient presents with right flank pain.  Began around 2 days ago.  Had done physical therapy on Tuesday but no specific injury.  Now more pain.  Worse with certain movements.  No urinary symptoms.  No fevers or chills.  No cough.  No trouble breathing.  No rash.     Prior to Admission medications   Medication Sig Start Date End Date Taking? Authorizing Provider  HYDROcodone -acetaminophen  (NORCO/VICODIN) 5-325 MG tablet Take 1-2 tablets by mouth every 6 (six) hours as needed. 04/20/24  Yes Patsey Lot, MD  methocarbamol  (ROBAXIN ) 500 MG tablet Take 1 tablet (500 mg total) by mouth every 8 (eight) hours as needed for muscle spasms. 04/20/24  Yes Patsey Lot, MD  acetaminophen  (TYLENOL ) 325 MG tablet Take 2 tablets (650 mg total) by mouth every 6 (six) hours as needed. 11/25/22   Tharon Lung, MD  albuterol  (PROVENTIL ) (2.5 MG/3ML) 0.083% nebulizer solution TAKE 3 ML (2.5 MG TOTAL) BY NEBULIZATION EVERY 4 HOURS AS NEEDED FOR WHEEZING OR SHORTNESS OF BREATH 09/05/23   Tharon Lung, MD  albuterol  (VENTOLIN  HFA) 108 (90 Base) MCG/ACT inhaler INHALE 2 PUFFS EVERY 4 HOURS AS NEEDED FOR WHEEZING 08/27/21   Lilland, Alana, DO  Baclofen  5 MG TABS TAKE 1 TABLET (5 MG TOTAL) BY MOUTH DAILY AS NEEDED (MUSCLE SPASMS). 01/25/24   Tharon Lung, MD  Blood Glucose Monitoring Suppl (ONE TOUCH ULTRA 2) w/Device KIT Use to check blood sugar as directed. 08/31/18   Delores Suzann HERO, MD  buPROPion  (WELLBUTRIN  XL) 300 MG 24 hr tablet TAKE 1 TABLET BY MOUTH EVERY DAY 03/28/24   Tharon Lung, MD  carvedilol  (COREG ) 6.25 MG tablet TAKE 1  TABLET BY MOUTH TWICE A DAY WITH FOOD 02/19/24   Tharon Lung, MD  cetirizine  (ZYRTEC  ALLERGY) 10 MG tablet Take 1 tablet (10 mg total) by mouth daily. 05/10/22   Lilland, Alana, DO  citalopram  (CELEXA ) 40 MG tablet TAKE 1 TABLET BY MOUTH EVERY DAY 02/15/24   Tharon Lung, MD  diclofenac  Sodium (VOLTAREN ) 1 % GEL APPLY 2 GRAMS TO AFFECTED AREA 4 TIMES A DAY 01/09/23   Lilland, Alana, DO  empagliflozin  (JARDIANCE ) 25 MG TABS tablet Take 1 tablet (25 mg total) by mouth daily. 12/20/23   Tharon Lung, MD  EPINEPHrine  (EPIPEN  2-PAK) 0.3 mg/0.3 mL IJ SOAJ injection Inject 0.3 mg into the muscle as needed for anaphylaxis. 08/16/23   Tharon Lung, MD  famotidine  (PEPCID ) 20 MG tablet TAKE 1 TABLET BY MOUTH TWICE A DAY 01/29/24   Tharon Lung, MD  Fluticasone -Umeclidin-Vilant (TRELEGY ELLIPTA ) 100-62.5-25 MCG/ACT AEPB TAKE 1 PUFF BY MOUTH EVERY DAY 02/05/24   Tharon Lung, MD  furosemide  (LASIX ) 20 MG tablet TAKE 2 TABLETS BY MOUTH DAILY AS NEEDED. 02/19/24   Tharon Lung, MD  glucose blood (ONE TOUCH ULTRA TEST) test strip Use for blood sugar testing 1-3 times daily 08/15/19   Dempsey Coy, MD  insulin  lispro (HUMALOG  [facility]) 100 UNIT/ML KwikPen Inject 12 Units into the skin 2 (two) times daily before a meal. 10/31/23   Elicia Hamlet, MD  Insulin  Pen Needle (BD PEN NEEDLE  NANO U/F) 32G X 4 MM MISC USE AS DIRECTED WITH INSULIN  4  TIMES DAILY 07/29/23   Tharon Lung, MD  Lancets MISC Use with blood sugar monitor to check daily 07/07/21   Delores Suzann HERO, MD  LANTUS  SOLOSTAR 100 UNIT/ML Solostar Pen INJECT SUBCUTANEOUSLY 40 UNITS  DAILY 08/15/22   Lilland, Alana, DO  levothyroxine  (SYNTHROID ) 100 MCG tablet Take 1 tablet (100 mcg total) by mouth every morning. 30 minutes before food 03/07/24   Tharon Lung, MD  losartan  (COZAAR ) 25 MG tablet TAKE 1 TABLET BY MOUTH DAILY 10/07/22   Lilland, Alana, DO  Melatonin 3-10 MG TABS Take 3-5 mg by mouth at bedtime. 08/29/23   Tharon Lung, MD  omeprazole  (PRILOSEC) 20 MG capsule TAKE 1  CAPSULE BY MOUTH  TWICE DAILY 02/01/21   Dempsey Coy, MD  ondansetron  (ZOFRAN ) 4 MG tablet Take 1 tablet (4 mg total) by mouth every 8 (eight) hours as needed for nausea or vomiting. 12/19/23   Rosendo Rush, MD  polyethylene glycol powder (GLYCOLAX /MIRALAX ) powder TAKE 17 GRAMS BY MOUTH 2  TIMES DAILY AS NEEDED FOR  LAXATIVE Patient taking differently: TAKE 17 GRAMS BY MOUTH 2  TIMES DAILY AS NEEDED FOR  LAXATIVE 10/23/17   Diallo, Irving, MD  PRODIGY NO CODING BLOOD GLUC test strip USE THREE TIMES A DAY TO CHECK ON BLOOD GLUCOSE LEVEL 10/02/20   Dempsey Coy, MD  ramelteon  (ROZEREM ) 8 MG tablet Take 1 tablet (8 mg total) by mouth at bedtime. 09/11/23   Tharon Lung, MD  rivaroxaban  (XARELTO ) 20 MG TABS tablet Take 1 tablet (20 mg total) by mouth daily with supper. 09/18/23   Tharon Lung, MD  rosuvastatin  (CRESTOR ) 20 MG tablet TAKE 1 TABLET BY MOUTH EVERY DAY 01/29/24   Tharon Lung, MD  Spacer/Aero-Holding Chambers (AEROCHAMBER PLUS) inhaler Use as instructed 06/08/18   Van Knee, MD  tirzepatide  (MOUNJARO ) 5 MG/0.5ML Pen INJECT 5 MG SUBCUTANEOUSLY WEEKLY 03/28/24   Tharon Lung, MD  traMADol  (ULTRAM ) 50 MG tablet Take 50 mg by mouth as needed. 09/21/21   [provider]  Travoprost , BAK Free, (TRAVATAN ) 0.004 % SOLN ophthalmic solution Place 1 drop into both eyes at bedtime. 09/15/14   Curtis Hadassah DASEN, MD  albuterol  (PROVENTIL ,VENTOLIN ) 90 MCG/ACT inhaler Inhale 2 puffs into the lungs every 4 (four) hours as needed for wheezing or shortness of breath. 05/30/11 10/21/11  Feliciana Millman, MD    Allergies: Chlorhexidine , Naproxen , Oxycodone , Pregabalin , Statins, Amitriptyline hcl, Benazepril hcl, Gabapentin , Oxycodone -acetaminophen , and Propoxyphene    Review of Systems  Genitourinary:  Positive for flank pain.    Updated Vital Signs BP (!) 152/90 (BP Location: Left Arm)   Pulse 85   Temp 98.5 F (36.9 C) (Oral)   Resp 16   SpO2 98%   Physical Exam Vitals and nursing  note reviewed.  Chest:     Chest wall: Tenderness present.  Abdominal:     Tenderness: There is no abdominal tenderness.  Genitourinary:    Comments: Some tenderness of right lateral to posterior ribs.  No rash.  No crepitance. Neurological:     General: No focal deficit present.     Mental Status: She is alert.     (all labs ordered are listed, but only abnormal results are displayed) Labs Reviewed  CBC WITH DIFFERENTIAL/PLATELET - Abnormal; Notable for the following components:      Result Value   RBC 5.50 (*)    All other components within normal limits  COMPREHENSIVE METABOLIC PANEL WITH GFR -  Abnormal; Notable for the following components:   Potassium 3.4 (*)    Glucose, Bld 135 (*)    BUN 5 (*)    All other components within normal limits  URINALYSIS, ROUTINE W REFLEX MICROSCOPIC - Abnormal; Notable for the following components:   APPearance HAZY (*)    Protein, ur 30 (*)    Leukocytes,Ua MODERATE (*)    All other components within normal limits  LIPASE, BLOOD    EKG: None  Radiology: CT Renal Stone Study Result Date: 04/20/2024 EXAM: CT UROGRAM 04/20/2024 12:35:51 PM TECHNIQUE: CT of the abdomen and pelvis was performed before and after the administration of intravenous contrast as per CT urogram protocol. Multiplanar reformatted images as well as MIP urogram images are provided for review. Automated exposure control, iterative reconstruction, and/or weight based adjustment of the mA/kV was utilized to reduce the radiation dose to as low as reasonably achievable. COMPARISON: 11/18/2022 CLINICAL HISTORY: Abdominal/flank pain, stone suspected. FINDINGS: LOWER CHEST: Biventricular pacer. No acute abnormality. LIVER: No suspicious liver abnormality. Cholecystectomy. GALLBLADDER AND BILE DUCTS: Gallbladder is surgically absent. No biliary ductal dilatation. SPLEEN: Normal spleen. PANCREAS: No acute abnormality. ADRENAL GLANDS: No acute abnormality. KIDNEYS, URETERS AND BLADDER:  2 small stones within the lower pole of the right kidney measure up to 2 mm. There is no left renal calculi. No hydronephrosis. No perinephric or periureteral stranding. Urinary bladder appears normal. GI AND BOWEL: The appendix is visualized and appears normal. No pathologic dilatation of the large or small bowel loops. No bowel wall thickening or inflammation. PERITONEUM AND RETROPERITONEUM: No ascites or focal fluid collection. No signs of pneumoperitoneum. Small fat-containing umbilical hernia. VASCULATURE: Aortic atherosclerosis and coronary artery calcifications. Aortic atherosclerotic calcification. LYMPH NODES: No enlarged lymph nodes within the abdomen or pelvis. REPRODUCTIVE ORGANS: Calcified uterine fibroid is noted of the posterior uterus measuring 3.8 cm. No adnexal mass. BONES AND SOFT TISSUES: Multilevel lumbar degenerative disc disease. New schmorl's node deformity is identified involving the inferior endplate of the L1 vertebra. No acute or suspicious osseous findings. IMPRESSION: 1. No obstructive uropathy or urothelial mass. 2. 2 small stones within the lower pole of the right kidney, measuring up to 2 mm. No left renal calculi. 3. New inferior endplate deformity involving the L1 vertebra, which is favored to represent a schmorl's node. Electronically signed by: Waddell Calk MD 04/20/2024 01:04 PM EDT RP Workstation: [id number]     Procedures   Medications Ordered in the ED  methocarbamol  (ROBAXIN ) tablet 500 mg (has no administration in time range)                                    Medical Decision Making Risk Prescription drug management.   Patient with right flank/rib pain.  Differential diagnose includes causes such as rib fractures, pneumonia.  Also urinary symptoms.  Blood work reassuring.  Urine also overall reassuring.  Has some white cells but no bacteria.  No urinary symptoms.  However no tenderness also over spine.  There is tenderness which is touching the skin.   No rash but potentially could be an early shingles.  Discussed with patient.  Will treat symptomatically.  Will give muscle relaxers and some stronger pain meds.  Can follow-up with PCP.  Instructed to return sooner if rash develops.      Final diagnoses:  Right flank pain    ED Discharge Orders  Ordered    methocarbamol  (ROBAXIN ) 500 MG tablet  Every 8 hours PRN        04/20/24 1459    HYDROcodone -acetaminophen  (NORCO/VICODIN) 5-325 MG tablet  Every 6 hours PRN        04/20/24 1459               Patsey Lot, MD 04/20/24 1517

## 2024-04-20 NOTE — ED Notes (Signed)
Patient has a urine culture in main lab 

## 2024-04-20 NOTE — ED Triage Notes (Signed)
 PT reports RT sided pain that started on Thursday.. Pain worse this AM. PT denies a fall.

## 2024-04-20 NOTE — ED Provider Notes (Signed)
 [facility]    CSN: [id number] Arrival date & time: 04/20/24  1040      History   Chief Complaint Chief Complaint  Patient presents with   Flank Pain    HPI Mercedes Barr is a 78 y.o. female.    Flank Pain  Here for right flank and side pain.  It began on September 11 and has intensified.  Today she rates it 10 out of 10.  On September 11 Tylenol  had helped but this morning it did not.  No fever or chills or nausea or vomiting or diarrhea.  No constipation.  No dysuria or hematuria.  Past surgical history includes a cholecystectomy. No rash She is allergic to oxycodone  and naproxen  and gabapentin .  She does take Xarelto .  Past Medical History:  Diagnosis Date   Abnormal involuntary movement 09/03/2008   Annotation: left hand  Qualifier: Diagnosis of   By: [place]  MD, Corean         Arthritis 11/06/2012   Tr TKR   Asthma    Cerumen impaction 05/03/2023   Chronic heart failure with preserved ejection fraction (HFpEF) (HCC)    Complete heart block (HCC)    Congenital heart disease    atrioventricular cushion defect repair age 31 (1988)   COPD (chronic obstructive pulmonary disease) (HCC)    Diabetes mellitus    Diarrhea 06/27/2023   Epigastric pain 01/31/2019   GERD (gastroesophageal reflux disease)    Gross hematuria 12/13/2018   Heart murmur    Hypertension    Hypothyroidism    Longstanding persistent atrial fibrillation (HCC)    Need for diphtheria-tetanus-pertussis (Tdap) vaccine 11/29/2022   Need for shingles vaccine 11/29/2022   Normal coronary arteries 08/08/2009   Pacemaker    PACEMAKER DEPENDENT-[facility] CROITORU [facility].  OFFICE NOTE DR. A. LITTLE STATES  EXTREMELY PACEMAKER SENSITIVE AND WHEN YOU TRY TO CHECK FOR UNDERLYING RHYTHMS SHE WILL HAVE SNYCOPE AND WE HAVE NOT DONE THIS NOW IN ABOUT 2 YEARS.   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   Ventricular ectopy 11/22/2022   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/20/2022   Osteoporosis 04/03/2019   History of revision of total replacement of right knee joint 12/07/2018   Primary open angle glaucoma of both eyes, moderate stage 11/30/2017   Persistent atrial fibrillation (HCC) 08/17/2017   (HFpEF) heart failure with preserved ejection fraction (HCC) 05/21/2015   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   Complete heart block (HCC)    Normal coronary arteries-Feb 2011    Pacemaker    Osteoarthritis, multiple sites 08/22/2012   Insomnia 03/08/2012   Allergy to bee sting 10/04/2011   Low back pain 09/07/2011   Hypothyroidism 12/28/2009   GLAUCOMA 12/25/2009   GERD 01/19/2009   COPD (chronic obstructive pulmonary disease) (HCC) 11/06/2008   Essential hypertension 06/14/2007   DM (diabetes mellitus), type 2 (HCC) 11/06/2006   Depression, recurrent (HCC) 11/06/2006   Dyslipidemia (  high LDL; low HDL) 11/03/2006    Past Surgical History:  Procedure Laterality Date   ATRIOVENTRICULAR CUSHION DEFECT REPAIR  1988   CARDIAC CATHETERIZATION  2011    SHOWED NO CAD-PER CARDIOLOGY OFFICE NOTES DR. A. LITTLE   CHOLECYSTECTOMY N/A 11/19/2022   Procedure: LAPAROSCOPIC CHOLECYSTECTOMY;  Surgeon: Teresa Lonni HERO, MD;  Location: [facility];  Service: General;  Laterality: N/A;   colonscopy      removed polyps    INSERT / REPLACE / REMOVE PACEMAKER     LAPAROSCOPIC LYSIS OF ADHESIONS  11/19/2022    Procedure: LAPAROSCOPIC LYSIS OF ADHESIONS;  Surgeon: Teresa Lonni HERO, MD;  Location: [facility];  Service: General;;   LEFT HEART CATH AND CORONARY ANGIOGRAPHY N/A 11/20/2022   Procedure: LEFT HEART CATH AND CORONARY ANGIOGRAPHY;  Surgeon: Wendel Lurena POUR, MD;  Location: [facility];  Service: Cardiovascular;  Laterality: N/A;   PACEMAKER INSERTION  1988   last gen 11/08- MDT   PPM GENERATOR CHANGEOUT N/A 02/15/2017   Procedure: PPM Generator Changeout;  Surgeon: Francyne Headland, MD;  Location: [facility];  Service: Cardiovascular;  Laterality: N/A;   TOTAL KNEE ARTHROPLASTY Right 11/19/2012   Procedure: RIGHT TOTAL KNEE ARTHROPLASTY;  Surgeon: Dempsey LULLA Moan, MD;  Location: [facility];  Service: Orthopedics;  Laterality: Right;   TOTAL KNEE REVISION Right 09/24/2014   Procedure: RIGHT TOTAL KNEE ARTHROPLASTY REVISION;  Surgeon: Dempsey Moan LULLA, MD;  Location: [facility];  Service: Orthopedics;  Laterality: Right;   TUBAL LIGATION      OB History   No obstetric history on file.      Home Medications    Prior to Admission medications   Medication Sig Start Date End Date Taking? Authorizing Provider  acetaminophen  (TYLENOL ) 325 MG tablet Take 2 tablets (650 mg total) by mouth every 6 (six) hours as needed. 11/25/22  Yes Mabe, Elna, MD  albuterol  (PROVENTIL ) (2.5 MG/3ML) 0.083% nebulizer solution TAKE 3 ML (2.5 MG TOTAL) BY NEBULIZATION EVERY 4 HOURS AS NEEDED FOR WHEEZING OR SHORTNESS OF BREATH 09/05/23  Yes Mabe, Elna, MD  Baclofen  5 MG TABS TAKE 1 TABLET (5 MG TOTAL) BY MOUTH DAILY AS NEEDED (MUSCLE SPASMS). 01/25/24  Yes Mabe, Elna, MD  Blood Glucose Monitoring Suppl (ONE TOUCH ULTRA 2) w/Device KIT Use to check blood sugar as directed. 08/31/18  Yes Delores Suzann HERO, MD  buPROPion  (WELLBUTRIN  XL) 300 MG 24 hr tablet TAKE 1 TABLET BY MOUTH EVERY DAY 03/28/24  Yes Mabe, Elna, MD  carvedilol  (COREG ) 6.25 MG tablet TAKE 1 TABLET BY MOUTH TWICE A DAY WITH FOOD 02/19/24  Yes Mabe,  Elna, MD  cetirizine  (ZYRTEC  ALLERGY) 10 MG tablet Take 1 tablet (10 mg total) by mouth daily. 05/10/22  Yes Lilland, Alana, DO  citalopram  (CELEXA ) 40 MG tablet TAKE 1 TABLET BY MOUTH EVERY DAY 02/15/24  Yes Mabe, Elna, MD  diclofenac  Sodium (VOLTAREN ) 1 % GEL APPLY 2 GRAMS TO AFFECTED AREA 4 TIMES A DAY 01/09/23  Yes Lilland, Alana, DO  empagliflozin  (JARDIANCE ) 25 MG TABS tablet Take 1 tablet (25 mg total) by mouth daily. 12/20/23  Yes Mabe, Elna, MD  famotidine  (PEPCID ) 20 MG tablet TAKE 1 TABLET BY MOUTH TWICE A DAY 01/29/24  Yes Tharon Elna, MD  Fluticasone -Umeclidin-Vilant (TRELEGY ELLIPTA ) 100-62.5-25 MCG/ACT AEPB TAKE 1 PUFF BY MOUTH EVERY DAY 02/05/24  Yes Mabe, Elna, MD  furosemide  (LASIX ) 20 MG tablet TAKE 2 TABLETS BY MOUTH DAILY AS NEEDED. 02/19/24  Yes Tharon Elna, MD  glucose blood (ONE TOUCH ULTRA TEST) test strip Use for blood sugar testing 1-3 times daily 08/15/19  Yes Dempsey Coy, MD  insulin  lispro (HUMALOG  KWIKPEN) 100 UNIT/ML KwikPen Inject 12 Units into the skin 2 (two) times daily before a meal. 10/31/23  Yes Elicia Hamlet, MD  Insulin  Pen Needle (BD PEN NEEDLE NANO U/F) 32G X 4 MM MISC USE AS DIRECTED WITH INSULIN  4  TIMES DAILY 07/29/23  Yes Tharon Lung, MD  Lancets MISC Use with blood sugar monitor to check daily 07/07/21  Yes Delores Suzann HERO, MD  LANTUS  SOLOSTAR 100 UNIT/ML Solostar Pen INJECT SUBCUTANEOUSLY 40 UNITS  DAILY 08/15/22  Yes Lilland, Alana, DO  levothyroxine  (SYNTHROID ) 100 MCG tablet Take 1 tablet (100 mcg total) by mouth every morning. 30 minutes before food 03/07/24  Yes Mabe, Lung, MD  losartan  (COZAAR ) 25 MG tablet TAKE 1 TABLET BY MOUTH DAILY 10/07/22  Yes Lilland, Alana, DO  Melatonin 3-10 MG TABS Take 3-5 mg by mouth at bedtime. 08/29/23  Yes Mabe, Lung, MD  omeprazole  (PRILOSEC) 20 MG capsule TAKE 1 CAPSULE BY MOUTH  TWICE DAILY 02/01/21  Yes Dempsey Coy, MD  ondansetron  (ZOFRAN ) 4 MG tablet Take 1 tablet (4 mg total) by mouth every 8 (eight) hours as  needed for nausea or vomiting. 12/19/23  Yes Rosendo Rush, MD  PRODIGY NO CODING BLOOD GLUC test strip USE THREE TIMES A DAY TO CHECK ON BLOOD GLUCOSE LEVEL 10/02/20  Yes Dempsey Coy, MD  ramelteon  (ROZEREM ) 8 MG tablet Take 1 tablet (8 mg total) by mouth at bedtime. 09/11/23  Yes Tharon Lung, MD  rivaroxaban  (XARELTO ) 20 MG TABS tablet Take 1 tablet (20 mg total) by mouth daily with supper. 09/18/23  Yes Mabe, Lung, MD  rosuvastatin  (CRESTOR ) 20 MG tablet TAKE 1 TABLET BY MOUTH EVERY DAY 01/29/24  Yes Tharon Lung, MD  Spacer/Aero-Holding Chambers (AEROCHAMBER PLUS) inhaler Use as instructed 06/08/18  Yes Van Knee, MD  tirzepatide  (MOUNJARO ) 5 MG/0.5ML Pen INJECT 5 MG SUBCUTANEOUSLY WEEKLY 03/28/24  Yes Mabe, Lung, MD  traMADol  (ULTRAM ) 50 MG tablet Take 50 mg by mouth as needed. 09/21/21  Yes [provider]  Travoprost , BAK Free, (TRAVATAN ) 0.004 % SOLN ophthalmic solution Place 1 drop into both eyes at bedtime. 09/15/14  Yes Curtis Hadassah DASEN, MD  albuterol  (VENTOLIN  HFA) 108 (90 Base) MCG/ACT inhaler INHALE 2 PUFFS EVERY 4 HOURS AS NEEDED FOR WHEEZING 08/27/21   Lilland, Alana, DO  EPINEPHrine  (EPIPEN  2-PAK) 0.3 mg/0.3 mL IJ SOAJ injection Inject 0.3 mg into the muscle as needed for anaphylaxis. 08/16/23   Tharon Lung, MD  polyethylene glycol powder (GLYCOLAX /MIRALAX ) powder TAKE 17 GRAMS BY MOUTH 2  TIMES DAILY AS NEEDED FOR  LAXATIVE Patient taking differently: TAKE 17 GRAMS BY MOUTH 2  TIMES DAILY AS NEEDED FOR  LAXATIVE 10/23/17   Diallo, Irving, MD  albuterol  (PROVENTIL ,VENTOLIN ) 90 MCG/ACT inhaler Inhale 2 puffs into the lungs every 4 (four) hours as needed for wheezing or shortness of breath. 05/30/11 10/21/11  Feliciana Millman, MD    Family History Family History  Problem Relation Age of Onset   Heart failure Mother    Hypertension Mother    Stroke Father    Heart failure Sister    Kidney disease Daughter    Cancer Brother    Breast cancer Neg Hx      Social History Social History   Tobacco Use   Smoking status: Never    Passive exposure: Never   Smokeless tobacco: Never  Vaping Use   Vaping status: Never Used  Substance Use Topics   Alcohol use: No   Drug use: No     Allergies   Chlorhexidine , Naproxen , Oxycodone , Pregabalin , Statins, Amitriptyline hcl, Benazepril hcl, Gabapentin , Oxycodone -acetaminophen , and Propoxyphene   Review of Systems Review of Systems  Genitourinary:  Positive for flank pain.     Physical Exam Triage Vital Signs ED Triage Vitals  Encounter Vitals Group     BP 04/20/24 1105 136/69     Girls Systolic BP Percentile --      Girls Diastolic BP Percentile --      Boys Systolic BP Percentile --      Boys Diastolic BP Percentile --      Pulse Rate 04/20/24 1105 64     Resp --      Temp 04/20/24 1105 98.1 F (36.7 C)     Temp src --      SpO2 04/20/24 1105 95 %     Weight --      Height --      Head Circumference --      Peak Flow --      Pain Score 04/20/24 1101 7     Pain Loc --      Pain Education --      Exclude from Growth Chart --    No data found.  Updated Vital Signs BP 136/69   Pulse 64   Temp 98.1 F (36.7 C)   SpO2 95%   Visual Acuity Right Eye Distance:   Left Eye Distance:   Bilateral Distance:    Right Eye Near:   Left Eye Near:    Bilateral Near:     Physical Exam Vitals reviewed.  Constitutional:      Appearance: She is not ill-appearing, toxic-appearing or diaphoretic.     Comments: No acute respiratory distress, but she is in obvious discomfort.  HENT:     Nose: Nose normal.     Mouth/Throat:     Mouth: Mucous membranes are moist.  Eyes:     Extraocular Movements: Extraocular movements intact.     Conjunctiva/sclera: Conjunctivae normal.     Pupils: Pupils are equal, round, and reactive to light.  Cardiovascular:     Rate and Rhythm: Normal rate and regular rhythm.  Pulmonary:     Effort: Pulmonary effort is normal.     Breath sounds:  Normal breath sounds.  Abdominal:     Comments: She is very tender in the right upper quadrant of her abdomen and her right flank.  There is no rash or deformity of the right flank or lower right lower rib cage.  Skin:    Coloration: Skin is not pale.  Neurological:     General: No focal deficit present.     Mental Status: She is alert and oriented to person, place, and time.  Psychiatric:        Behavior: Behavior normal.      UC Treatments / Results  Labs (all labs ordered are listed, but only abnormal results are displayed) Labs Reviewed - No data to display  EKG   Radiology No results found.  Procedures Procedures (including critical care time)  Medications Ordered in UC Medications - No data to display  Initial Impression / Assessment and Plan / UC Course  I have reviewed the triage vital signs and the nursing notes.  Pertinent labs & imaging results that were available during my care of the patient were reviewed by me  and considered in my medical decision making (see chart for details).     I have asked the patient and her family to proceed to the emergency room for further evaluation and treatment that we cannot provide for her in the urgent care clinic, for her severe abdominal pain and flank pain.  They are in agreement and will go by private vehicle. Final Clinical Impressions(s) / UC Diagnoses   Final diagnoses:  Right flank pain     Discharge Instructions      She will go to the emergency room for further evaluation     ED Prescriptions   None    PDMP not reviewed this encounter.   Vonna Sharlet POUR, MD 04/20/24 1131

## 2024-04-20 NOTE — Discharge Instructions (Signed)
 She will go to the emergency room for further evaluation

## 2024-04-20 NOTE — ED Provider Triage Note (Signed)
 Emergency Medicine Provider Triage Evaluation Note  Mercedes Barr , a 78 y.o. female  was evaluated in triage.  Pt complains of right flank pain since the 11th.  Has been intermittent.  Denies any trauma to the area.  No radiation.  Denies any chest pain, shortness breath, cough or cold symptoms, fever, dysuria, hematuria.  No history of kidney stones.  Was sent over here from urgent care.  Review of Systems  Positive:  Negative:   Physical Exam  BP (!) 122/107 (BP Location: Left Arm)   Pulse 85   Temp 98.5 F (36.9 C) (Oral)   Resp 16   SpO2 98%  Gen:   Awake, no distress   Resp:  Normal effort  MSK:   Moves extremities without difficulty  Other:  Patient has some tenderness to the right flank.  Does not appear in any acute distress.  Medical Decision Making  Medically screening exam initiated at 12:17 PM.  Appropriate orders placed.  BRIANNI MANTHE was informed that the remainder of the evaluation will be completed by another provider, this initial triage assessment does not replace that evaluation, and the importance of remaining in the ED until their evaluation is complete.  She does have some tenderness, I think this is more likely muscle skeletal however will order CT renal and labs.   Bernis Ernst, [place] 04/20/24 1219

## 2024-04-24 ENCOUNTER — Other Ambulatory Visit: Payer: Self-pay | Admitting: Family Medicine

## 2024-05-03 ENCOUNTER — Telehealth: Payer: Self-pay | Admitting: Family Medicine

## 2024-05-03 NOTE — Telephone Encounter (Signed)
 As documented in previous notes over the last 6 months, patient has been working well with A1c control. She is consistently using her CGM which aids her in effective management. She continues to use Humalog  12 units BID before meals, Lantus  40 units daily, and tirzepatide  5 mg weekly. She is in need of continued access to these medications and CGM supplies.

## 2024-05-09 ENCOUNTER — Telehealth: Payer: Self-pay

## 2024-05-09 NOTE — Progress Notes (Signed)
 Remote PPM Transmission

## 2024-05-09 NOTE — Telephone Encounter (Signed)
 Patient calls nurse line regarding concerns with Xarelto  prescription.   Per chart review, patient picked up 90 day supply on 03/05/24.   Returned call to patient. She is going to look around her house to see if she can find rx bottle.   I instructed patient on process for calling her insurance company if medication is lost. They should be able to provide her with override for an early fill.   Patient voices understanding and plans to follow up with PCP at visit next week.   Chiquita JAYSON English, RN

## 2024-05-14 ENCOUNTER — Encounter: Payer: Self-pay | Admitting: Family Medicine

## 2024-05-14 ENCOUNTER — Ambulatory Visit: Admitting: Family Medicine

## 2024-05-14 VITALS — BP 179/61 | HR 73 | Ht 62.0 in | Wt 134.8 lb

## 2024-05-14 DIAGNOSIS — S39012D Strain of muscle, fascia and tendon of lower back, subsequent encounter: Secondary | ICD-10-CM

## 2024-05-14 DIAGNOSIS — I4819 Other persistent atrial fibrillation: Secondary | ICD-10-CM

## 2024-05-14 DIAGNOSIS — I10 Essential (primary) hypertension: Secondary | ICD-10-CM | POA: Diagnosis not present

## 2024-05-14 MED ORDER — LOSARTAN POTASSIUM 25 MG PO TABS: 25.0000 mg | ORAL_TABLET | Freq: Every day | ORAL | 2 refills | Status: DC

## 2024-05-14 MED ORDER — CARVEDILOL 6.25 MG PO TABS: 6.2500 mg | ORAL_TABLET | Freq: Two times a day (BID) | ORAL | 1 refills | Status: AC

## 2024-05-14 MED ORDER — RIVAROXABAN 20 MG PO TABS: 20.0000 mg | ORAL_TABLET | Freq: Every day | ORAL | 3 refills | Status: AC

## 2024-05-14 NOTE — Progress Notes (Signed)
    SUBJECTIVE:   CHIEF COMPLAINT / HPI:   Discussed the use of AI scribe software for clinical note transcription with the patient, who gave verbal consent to proceed.  History of Present Illness Mercedes Barr is a 78 year old female who presents with persistent back and side pain.  The pain began after a physical therapy session, initially starting on one right side before moving to her entire back. It has since become persistent, affecting both the back and side. Despite taking prescribed medications, including muscle relaxants and hydrocodone , she experiences minimal relief. Her daughter applies some pain relief cream, which provides temporary relief.  She reports associated symptoms of weakness, particularly around the calf muscles, which began concurrently with the back pain. She denies numbness, and tingling, but denies incontinence.  She has a history of high blood pressure and reports missing her medication. She has not had her blood thinner either in about a month and is unable to find her blood pressure medication at home.   OBJECTIVE:   Pulse 73   Ht 5' 2 (1.575 m)   Wt 134 lb 12.8 oz (61.1 kg)   SpO2 97%   BMI 24.66 kg/m   Physical Exam GENERAL: Alert, cooperative, well developed, no acute distress HEENT: Normocephalic, normal oropharynx, moist mucous membranes CHEST: Clear to auscultation bilaterally, no wheezes, rhonchi, or crackles MUSCULOSKELETAL: Tenderness in lower back/paraspinal muscles and along spinal column on palpation NEUROLOGICAL: Cranial nerves grossly intact, moves all extremities without gross motor or sensory deficit, strength and sensation intact bilaterally in bilateral lower extremities, gait normal  ASSESSMENT/PLAN:   Assessment and Plan Assessment & Plan Low back and side pain with muscle strain and lower extremity weakness   The pain is musculoskeletal, likely due to muscle strain, as indicated by tenderness along the spinal column and  lower back. A CT scan ruled out serious conditions like kidney stones or other spinal pathology.  Reassuring exam today without incontinence.  Previous medications, including muscle relaxants and hydrocodone , were ineffective. Recommend using lidocaine  patches for localized pain relief. Continue using Voltaren  gel for its anti-inflammatory effects though not concurrently with a lidocaine  patch.  I have given her home exercises she can do.  Hold off on further PT at this time until I see her back in the office.  Schedule a follow-up appointment in two weeks to monitor pain and treatment efficacy.  Hypertension   Her blood pressure is elevated due to missed doses of her medication.  I have refilled these medications. Recheck blood pressure during the follow-up appointment in two weeks.  Persistent atrial fibrillation I have refilled her Xarelto  today.  Stuart Redo, MD [facility]

## 2024-05-14 NOTE — Patient Instructions (Signed)
 I recommend lidocaine  patches for the back. You can also take tylenol . Use voltaren  as well but NOT WITH the patches. Come back in 2 weeks. I have also provided some home exercises. ONLY do the ones you feel safe doing. If you have not improved, we may need PT at that time.  Be sure to use mucinex  for the phlegm. I also have refilled your blood thinner and blood pressure medications.

## 2024-05-17 ENCOUNTER — Telehealth: Payer: Self-pay

## 2024-05-17 MED ORDER — FREESTYLE LIBRE 2 READER DEVI: 1.0000 | Freq: Every day | 0 refills | Status: DC

## 2024-05-17 NOTE — Telephone Encounter (Signed)
 Sent in order for new [facility] 2 reader.

## 2024-05-17 NOTE — Telephone Encounter (Signed)
 Patient calls nurse line in regards to CGM.  She reports she has the [employer] 2 and reports the reader is not staying charged and feels she is no longer getting an accurate reading.   Unsure if this is something that can be sent to the pharmacy, as patient reports it was delivered to my house a long time ago.   Advised I was unfamiliar, however will forward to PCP and pharmacy team for assistance.

## 2024-05-17 NOTE — Telephone Encounter (Signed)
 Patient has been updated.

## 2024-05-18 ENCOUNTER — Other Ambulatory Visit: Payer: Self-pay | Admitting: Family Medicine

## 2024-05-20 MED ORDER — FREESTYLE LIBRE 3 PLUS SENSOR MISC: 11 refills | Status: AC

## 2024-05-20 NOTE — Telephone Encounter (Signed)
 Patient contacted for follow-up of CGM NEED for NEW Sensor.  Since last contact patient reports she has not picked up new READER as the pharmacy had to order it.  I offered her a visit to transition her to [facility] 3 READER and Libre 3+ sensors  Patient agreed to visit 10/14 at 11:00 AM  - pharmacy clinic.   Contacted pharmacy and cancelled order for [facility] 2 READER - pharmacy thanked us  for the call.   Total time with patient call and documentation of interaction: 5 minutes.

## 2024-05-20 NOTE — Addendum Note (Signed)
 Addended by: Rhenda [place] G on: 05/20/2024 09:15 AM   Modules accepted: Orders

## 2024-05-21 ENCOUNTER — Encounter: Payer: Self-pay | Admitting: Pharmacist

## 2024-05-21 ENCOUNTER — Ambulatory Visit (INDEPENDENT_AMBULATORY_CARE_PROVIDER_SITE_OTHER): Admitting: Pharmacist

## 2024-05-21 VITALS — BP 151/71 | HR 62 | Wt 135.0 lb

## 2024-05-21 DIAGNOSIS — I10 Essential (primary) hypertension: Secondary | ICD-10-CM | POA: Diagnosis not present

## 2024-05-21 DIAGNOSIS — E785 Hyperlipidemia, unspecified: Secondary | ICD-10-CM | POA: Diagnosis not present

## 2024-05-21 DIAGNOSIS — Z794 Long term (current) use of insulin: Secondary | ICD-10-CM | POA: Diagnosis not present

## 2024-05-21 DIAGNOSIS — E119 Type 2 diabetes mellitus without complications: Secondary | ICD-10-CM | POA: Diagnosis not present

## 2024-05-21 NOTE — Patient Instructions (Signed)
 It was nice to see you today!  Your goal blood sugar is 80-130 before eating and less than 180 after eating.  Medication Changes: Continue medications the same!  Monitor blood sugars at home and keep a log (glucometer or piece of paper) to bring with you to your next visit.  Keep up the good work with diet and exercise. Aim for a diet full of vegetables, fruit and lean meats (chicken, [place], fish). Try to limit salt intake by eating fresh or frozen vegetables (instead of canned), rinse canned vegetables prior to cooking and do not add any additional salt to meals.

## 2024-05-21 NOTE — Assessment & Plan Note (Addendum)
 Hypertension longstanding since currently uncontrolled. Blood pressure goal of <130/80  mmHg. Medication adherence good. Blood pressure control is suboptimal due to low dosages of antihypertensives. -Continued Carvedilol  6.25 mg BID - Losartan  25 mg daily, consider change to alternate ARB and moderate at next visit

## 2024-05-21 NOTE — Assessment & Plan Note (Signed)
 ASCVD risk - primary in patient with diabetes. Last LDL is 41 at goal of <70 mg/dL. ASCVD risk factors include diabetes and 10-year ASCVD risk score of 36.2. High intensity statin indicated.  -Continued Rosuvastatin  20 mg.

## 2024-05-21 NOTE — Progress Notes (Signed)
 S:     Chief Complaint  Patient presents with   Medication Management    Diabetes CGM change   78 y.o. female who presents for diabetes evaluation, education, and management. Patient arrives in  good spirits and presents without  any assistance. Majority of visit today focused on CGM transition from [place] to Libre3+ READER with sensors.   Patient was referred and last seen by Primary Care Provider, Dr. Tharon, on 05/14/24.   PMH is significant for Diabetes, Afib, Osteoporosis, hyperlipidemia, COPD, hypothyroidism, HFpEF.  Patient reports Diabetes was diagnosed in 2008.   Current diabetes medications include: Jardiance  (empagliflozin ) 25 mg daily, Lantus  (insulin  glargine) 20 units daily, Humalog  (insulin  lispro) 12 units with meals, Mounjaro  (tirzepatide ) 5 mg weekly Current hypertension medications include: Losartan  25 mg daily, carvedilol  6.26 mg BID Current hyperlipidemia medications include: Rosuvustatin 20 mg daily   Patient reports adherence to taking all medications as prescribed.   Do you feel that your medications are working for you? yes Have you been experiencing any side effects to the medications prescribed? no Do you have any problems obtaining medications due to transportation or finances? no Insurance coverage: [facility] Medicare  Patient denies hypoglycemic events. Patient forgot to bring [place] 2 reader so no CGM information was able to be collected  O:   Review of Systems  All other systems reviewed and are negative.   Physical Exam Constitutional:      Appearance: Normal appearance. She is normal weight.  Pulmonary:     Effort: Pulmonary effort is normal.  Neurological:     Mental Status: She is alert.  Psychiatric:        Mood and Affect: Mood normal.        Behavior: Behavior normal.        Thought Content: Thought content normal.        Judgment: Judgment normal.    Lab Results  Component Value Date   HGBA1C 8.1 (A) 03/06/2024   Vitals:    05/21/24 1103 05/21/24 1116  BP: (!) 146/66 (!) 151/71  Pulse: 62   SpO2: 98%     Lipid Panel     Component Value Date/Time   CHOL 139 11/28/2023 1354   TRIG 253 (H) 11/28/2023 1354   HDL 46 11/28/2023 1354   CHOLHDL 3.0 11/28/2023 1354   CHOLHDL 3.9 11/24/2022 1215   VLDL 26 11/24/2022 1215   LDLCALC 53 11/28/2023 1354   LDLDIRECT 56 06/08/2012 1120    Clinical Atherosclerotic Cardiovascular Disease (ASCVD):  The 10-year ASCVD risk score (Arnett DK, et al., 2019) is: 30%   Values used to calculate the score:     Age: 35 years     Clincally relevant sex: Female     Is Non-Hispanic African American: Yes     Diabetic: Yes     Tobacco smoker: No     Systolic Blood Pressure: 151 mmHg     Is BP treated: Yes     HDL Cholesterol: 46 mg/dL     Total Cholesterol: 139 mg/dL   Patient is participating in a Managed Medicaid Plan:  Yes   A/P: Diabetes diagnosed in 2008 currently controlled on Insulin , Jardiance  (empagliflozin ), and Mounjaro  (tirzepatide ). Patient is  able to verbalize appropriate hypoglycemia management plan. Medication adherence appears good. Patient forgot to bring [facility] CGM reader so information on CGM report unable to be obtained. Visit scheduled in 3 weeks to review CGM. -Continued basal insulin  Lantus  (insulin  glargine) 20 units every morning  -Continued  rapid insulin   Humalog  (insulin  lispro) at 12 units twice daily with meals  -Continued GLP-1 Mounjaro  (tirzepatide ) at 5 mg weekly -Continued SGLT2-I Jardiance  (empagliflozin ) 10 mg. Counseled on sick day rules. -Patient educated on purpose, proper use, and potential adverse effects.  -Extensively discussed pathophysiology of diabetes, recommended lifestyle interventions, dietary effects on blood sugar control.  -Counseled on s/sx of and management of hypoglycemia.   ASCVD risk - primary in patient with diabetes. Last LDL is 41 at goal of <70 mg/dL. ASCVD risk factors include diabetes and 10-year ASCVD risk score  of 36.2. High intensity statin indicated.  -Continued Rosuvastatin  20 mg.   Hypertension longstanding since currently uncontrolled. Blood pressure goal of <130/80  mmHg. Medication adherence good. Blood pressure control is suboptimal due to low dosages of antihypertensives. -Continued Carvedilol  6.25 mg BID - Losartan  25 mg daily, consider change to alternate ARB and moderate at next visit   Written patient instructions provided. Patient verbalized understanding of treatment plan.  Total time in face to face counseling 24 minutes.    Follow-up:  Pharmacist 06/11/24 Patient seen with Lawson Mao, PharmD Candidate - PY3 student and Belvie Macintosh, PharmD - PY4 Candidate.

## 2024-05-21 NOTE — Assessment & Plan Note (Addendum)
 Diabetes diagnosed in 2008 currently controlled on Insulin , Jardiance  (empagliflozin ), and Mounjaro  (tirzepatide ). Patient is  able to verbalize appropriate hypoglycemia management plan. Medication adherence appears good. Patient forgot to bring [facility] CGM reader so information on CGM report unable to be obtained. Visit scheduled in 3 weeks to review CGM. -Continued basal insulin  Lantus  (insulin  glargine) 20 units every morning  -Continued rapid insulin   Humalog  (insulin  lispro) at 12 units twice daily with meals  -Continued GLP-1 Mounjaro  (tirzepatide ) at 5 mg weekly -Continued SGLT2-I Jardiance  (empagliflozin ) 10 mg. Counseled on sick day rules. -Patient educated on purpose, proper use, and potential adverse effects.  -Extensively discussed pathophysiology of diabetes, recommended lifestyle interventions, dietary effects on blood sugar control.  -Counseled on s/sx of and management of hypoglycemia.

## 2024-05-22 ENCOUNTER — Other Ambulatory Visit: Payer: Self-pay | Admitting: Family Medicine

## 2024-05-22 DIAGNOSIS — J411 Mucopurulent chronic bronchitis: Secondary | ICD-10-CM

## 2024-05-24 NOTE — Progress Notes (Signed)
 Reviewed and agree with Dr Rennis plan.

## 2024-05-28 ENCOUNTER — Ambulatory Visit: Admitting: Family Medicine

## 2024-05-29 ENCOUNTER — Ambulatory Visit

## 2024-05-29 DIAGNOSIS — I442 Atrioventricular block, complete: Secondary | ICD-10-CM | POA: Diagnosis not present

## 2024-05-31 LAB — CUP PACEART REMOTE DEVICE CHECK
Battery Impedance: 887 Ohm
Battery Remaining Longevity: 72 mo
Battery Voltage: 2.76 V
Brady Statistic RV Percent Paced: 99 %
Date Time Interrogation Session: 20251022070544
Implantable Lead Connection Status: 753985
Implantable Lead Connection Status: 753985
Implantable Lead Implant Date: 19990203
Implantable Lead Implant Date: 19990203
Implantable Lead Location: 753859
Implantable Lead Location: 753860
Implantable Lead Model: 5068
Implantable Lead Model: 5092
Implantable Pulse Generator Implant Date: 20180711
Lead Channel Impedance Value: 639 Ohm
Lead Channel Impedance Value: 67 Ohm
Lead Channel Pacing Threshold Amplitude: 0.75 V
Lead Channel Pacing Threshold Pulse Width: 0.4 ms
Lead Channel Setting Pacing Amplitude: 2.5 V
Lead Channel Setting Pacing Pulse Width: 0.4 ms
Lead Channel Setting Sensing Sensitivity: 4 mV
Zone Setting Status: 755011
Zone Setting Status: 755011

## 2024-05-31 NOTE — Progress Notes (Signed)
 Remote PPM Transmission

## 2024-06-03 ENCOUNTER — Ambulatory Visit: Payer: Self-pay | Admitting: Cardiovascular Disease

## 2024-06-11 ENCOUNTER — Ambulatory Visit: Admitting: Pharmacist

## 2024-06-13 NOTE — Telephone Encounter (Signed)
 Patient contacted for follow-up of missed appointment.  Patient reports more readings in the 200s and some in the 100s CGM is working to help her identify carbohydrates and food that increases glucose readings.   Rescheduled for 11/25 at 9:30   Total time with patient call and documentation of interaction: 6 minutes.

## 2024-06-20 ENCOUNTER — Telehealth: Payer: Self-pay

## 2024-06-20 ENCOUNTER — Encounter

## 2024-06-20 VITALS — Ht 62.0 in | Wt 134.0 lb

## 2024-06-20 DIAGNOSIS — Z0000 Encounter for general adult medical examination without abnormal findings: Secondary | ICD-10-CM

## 2024-06-20 NOTE — Telephone Encounter (Signed)
 error

## 2024-06-20 NOTE — Patient Instructions (Addendum)
 Ms. Guile,  Thank you for taking the time for your Medicare Wellness Visit. I appreciate your continued commitment to your health goals. Please review the care plan we discussed, and feel free to reach out if I can assist you further.  Please note that Annual Wellness Visits do not include a physical exam. Some assessments may be limited, especially if the visit was conducted virtually. If needed, we may recommend an in-person follow-up with your provider.  Ongoing Care Seeing your primary care provider every 3 to 6 months helps us  monitor your health and provide consistent, personalized care.   Referrals If a referral was made during today's visit and you haven't received any updates within two weeks, please contact the referred provider directly to check on the status.  Recommended Screenings:  Health Maintenance  Topic Date Due   Zoster (Shingles) Vaccine (1 of 2) Never done   DTaP/Tdap/Td vaccine (2 - Tdap) 01/07/2012   Medicare Annual Wellness Visit  09/28/2023   Eye exam for diabetics  01/27/2024   Flu Shot  03/08/2024   COVID-19 Vaccine (6 - 2025-26 season) 04/08/2024   Complete foot exam   06/26/2024   Hemoglobin A1C  09/06/2024   Yearly kidney health urinalysis for diabetes  10/24/2024   Yearly kidney function blood test for diabetes  04/20/2025   Colon Cancer Screening  03/02/2027   Pneumococcal Vaccine for age over 46  Completed   DEXA scan (bone density measurement)  Completed   Hepatitis C Screening  Completed   Meningitis B Vaccine  Aged Out   Breast Cancer Screening  Discontinued       06/20/2024   10:06 AM  Advanced Directives  Does Patient Have a Medical Advance Directive? No  Would patient like information on creating a medical advance directive? No - Patient declined    Vision: Annual vision screenings are recommended for early detection of glaucoma, cataracts, and diabetic retinopathy. These exams can also reveal signs of chronic conditions such as  diabetes and high blood pressure.  Dental: Annual dental screenings help detect early signs of oral cancer, gum disease, and other conditions linked to overall health, including heart disease and diabetes.  Please see the attached documents for additional preventive care recommendations.

## 2024-06-20 NOTE — Progress Notes (Signed)
 This encounter was created in error - please disregard.

## 2024-06-20 NOTE — Progress Notes (Signed)
 Chief Complaint  Patient presents with   Medicare Wellness   Error   Error     Subjective:   Mercedes Barr is a 78 y.o. female who presents for a Medicare Annual Wellness Visit.  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 CROITORU - [facility].  OFFICE NOTE DR. A. LITTLE STATES  EXTREMELY PACEMAKER SENSITIVE AND WHEN YOU TRY TO CHECK FOR UNDERLYING RHYTHMS SHE WILL HAVE SNYCOPE AND WE HAVE NOT DONE THIS NOW IN ABOUT 2 YEARS.   Shortness of breath dyspnea    walking distance or climbing stairs   Skin lesion 12/07/2022   Tremor of left hand 06/12/2015   Weight loss, unintentional 12/21/2012   04/03/13 - [facility] (see scanned report) upper GI endoscopy showed normal esophagus, stomach, and duodenum; recommend regular diet, continuing current meds, and gastric emptying study     Past Surgical History:  Procedure  Laterality Date   ATRIOVENTRICULAR CUSHION DEFECT REPAIR  1988   CARDIAC CATHETERIZATION  2011    SHOWED NO CAD-PER CARDIOLOGY OFFICE NOTES DR. A. LITTLE   CHOLECYSTECTOMY N/A 11/19/2022   Procedure: LAPAROSCOPIC CHOLECYSTECTOMY;  Surgeon: Teresa Lonni HERO, MD;  Location: [facility] OR;  Service: General;  Laterality: N/A;   colonscopy      removed polyps    INSERT / REPLACE / REMOVE PACEMAKER     LAPAROSCOPIC LYSIS OF ADHESIONS  11/19/2022   Procedure: LAPAROSCOPIC LYSIS OF ADHESIONS;  Surgeon: Teresa Lonni HERO, MD;  Location: [facility] OR;  Service: General;;   LEFT HEART CATH AND CORONARY ANGIOGRAPHY N/A 11/20/2022   Procedure: LEFT HEART CATH AND CORONARY ANGIOGRAPHY;  Surgeon: Wendel Lurena POUR, MD;  Location: [facility];  Service: Cardiovascular;  Laterality: N/A;   PACEMAKER INSERTION  1988   last gen 11/08- MDT   PPM GENERATOR CHANGEOUT N/A 02/15/2017   Procedure: PPM Generator Changeout;  Surgeon: Francyne Headland, MD;  Location: [facility];  Service: Cardiovascular;  Laterality: N/A;   TOTAL KNEE ARTHROPLASTY Right 11/19/2012   Procedure: RIGHT TOTAL KNEE ARTHROPLASTY;  Surgeon: Dempsey LULLA Moan, MD;  Location: [facility];  Service: Orthopedics;  Laterality: Right;   TOTAL KNEE REVISION Right 09/24/2014   Procedure: RIGHT TOTAL KNEE ARTHROPLASTY REVISION;  Surgeon: Dempsey Moan LULLA, MD;  Location: [facility];  Service: Orthopedics;  Laterality: Right;   TUBAL LIGATION     Family History  Problem Relation Age of Onset  Heart failure Mother    Hypertension Mother    Stroke Father    Heart failure Sister    Kidney disease Daughter    Cancer Brother    Breast cancer Neg Hx    Social History   Occupational History   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   Tobacco Use   Smoking status: Never    Passive exposure: Never   Smokeless tobacco: Never  Vaping Use   Vaping status: Never Used  Substance and Sexual Activity   Alcohol use: No   Drug use: No   Sexual  activity: Not Currently   Tobacco Counseling Counseling given: No  SDOH Screenings   Food Insecurity: No Food Insecurity (06/20/2024)  Housing: Unknown (06/20/2024)  Transportation Needs: No Transportation Needs (06/20/2024)  Utilities: Not At Risk (06/20/2024)  Alcohol Screen: Low Risk  (09/27/2022)  Depression (PHQ2-9): Low Risk  (06/20/2024)  Financial Resource Strain: Low Risk  (09/27/2022)  Physical Activity: Insufficiently Active (06/20/2024)  Social Connections: Moderately Integrated (06/20/2024)  Stress: No Stress Concern Present (06/20/2024)  Tobacco Use: Low Risk  (06/20/2024)  Health Literacy: Adequate Health Literacy (06/20/2024)   See flowsheets for full screening details  Depression Screen PHQ 2 & 9 Depression Scale- Over the past 2 weeks, how often have you been bothered by any of the following problems? Little interest or pleasure in doing things: 0 Feeling down, depressed, or hopeless (PHQ Adolescent also includes...irritable): 0 PHQ-2 Total Score: 0 Trouble falling or staying asleep, or sleeping too much: 0 Feeling tired or having little energy: 0 Poor appetite or overeating (PHQ Adolescent also includes...weight loss): 0 Feeling bad about yourself - or that you are a failure or have let yourself or your family down: 0 Trouble concentrating on things, such as reading the newspaper or watching television (PHQ Adolescent also includes...like school work): 0 Moving or speaking so slowly that other people could have noticed. Or the opposite - being so fidgety or restless that you have been moving around a lot more than usual: 0 Thoughts that you would be better off dead, or of hurting yourself in some way: 0 PHQ-9 Total Score: 0 If you checked off any problems, how difficult have these problems made it for you to do your work, take care of things at home, or get along with other people?: Somewhat difficult  Depression Treatment Depression Interventions/Treatment :  Currently on Treatment     Goals Addressed               This Visit's Progress     Remain active (pt-stated)         Visit info / Clinical Intake: Medicare Wellness Visit Type:: Subsequent Annual Wellness Visit Persons participating in visit:: patient Medicare Wellness Visit Mode:: Telephone If telephone:: video declined Because this visit was a virtual/telehealth visit:: pt reported vitals If Telephone or Video please confirm:: I connected with the patient using audio enabled telemedicine application and verified that I am speaking with the correct person using two identifiers Patient Location:: Home Provider Location:: Office Information given by:: patient Interpreter Needed?: No Pre-visit prep was completed: no AWV questionnaire completed by patient prior to visit?: no Living arrangements:: with family/others Patient's Overall Health Status Rating: (!) fair Typical amount of pain: some (Rt Knee and Leg. Followed by medical attention) Does pain affect daily life?: no Are you currently prescribed opioids?: no  Dietary Habits and Nutritional Risks How many meals a day?: 3 Eats fruit and vegetables daily?: yes Most  meals are obtained by: preparing own meals (Daughter assist sometimes) In the last 2 weeks, have you had any of the following?: none Diabetic:: (!) yes Any non-healing wounds?: no How often do you check your BS?: 3 Would you like to be referred to a Nutritionist or for Diabetic Management? : no  Functional Status Activities of Daily Living (to include ambulation/medication): Independent Ambulation: Independent with device- listed below Home Assistive Devices/Equipment: Walker (specify Type); Cane; [place]; Dentures (specify type) Medication Administration: Independent Home Management: Independent Manage your own finances?: yes Primary transportation is: family/friends Concerns about vision?: no *vision screening is required for [facility]* Concerns about  hearing?: no  Fall Screening Falls in the past year?: 0 Number of falls in past year: 1 Was there an injury with Fall?: 0 Fall Risk Category Calculator: 1 Patient Fall Risk Level: Low Fall Risk  Fall Risk Patient at Risk for Falls Due to: No Fall Risks Fall risk Follow up: Falls prevention discussed  Home and Transportation Safety: All rugs have non-skid backing?: N/A, no rugs All stairs or steps have railings?: yes Grab bars in the bathtub or shower?: yes Have non-skid surface in bathtub or shower?: yes Good home lighting?: yes Regular seat belt use?: yes Hospital stays in the last year:: no How many hospital stays:: 5 Reason: Gallbladder surgery  Cognitive Assessment Difficulty concentrating, remembering, or making decisions? : no Will 6CIT or Mini Cog be Completed: no 6CIT or Mini Cog Declined: patient alert, oriented, able to answer questions appropriately and recall recent events  Advance Directives (For Healthcare) Does Patient Have a Medical Advance Directive?: No Would patient like information on creating a medical advance directive?: No - Patient declined  Reviewed/Updated  Reviewed/Updated: Reviewed All (Medical, Surgical, Family, Medications, Allergies, Care Teams, Patient Goals)        Objective:    Today's Vitals   06/20/24 1004  Weight: 134 lb (60.8 kg)  Height: 5' 2 (1.575 m)   Body mass index is 24.51 kg/m.  Current Medications (verified) Outpatient Encounter Medications as of 06/20/2024  Medication Sig   acetaminophen  (TYLENOL ) 325 MG tablet Take 2 tablets (650 mg total) by mouth every 6 (six) hours as needed.   albuterol  (PROVENTIL ) (2.5 MG/3ML) 0.083% nebulizer solution TAKE 3 ML (2.5 MG TOTAL) BY NEBULIZATION EVERY 4 HOURS AS NEEDED FOR WHEEZING OR SHORTNESS OF BREATH   albuterol  (VENTOLIN  HFA) 108 (90 Base) MCG/ACT inhaler INHALE 2 PUFFS EVERY 4 HOURS AS NEEDED FOR WHEEZING   Baclofen  5 MG TABS TAKE 1 TABLET (5 MG TOTAL) BY MOUTH DAILY AS  NEEDED (MUSCLE SPASMS).   Blood Glucose Monitoring Suppl (ONE TOUCH ULTRA 2) w/Device KIT Use to check blood sugar as directed.   buPROPion  (WELLBUTRIN  XL) 300 MG 24 hr tablet TAKE 1 TABLET BY MOUTH EVERY DAY   carvedilol  (COREG ) 6.25 MG tablet Take 1 tablet (6.25 mg total) by mouth 2 (two) times daily with a meal.   cetirizine  (ZYRTEC  ALLERGY) 10 MG tablet Take 1 tablet (10 mg total) by mouth daily.   citalopram  (CELEXA ) 40 MG tablet TAKE 1 TABLET BY MOUTH EVERY DAY   Continuous Glucose Sensor (FREESTYLE LIBRE 3 PLUS SENSOR) MISC Change sensor every 15 days.   diclofenac  Sodium (VOLTAREN ) 1 % GEL APPLY 2 GRAMS TO AFFECTED AREA 4 TIMES A DAY   empagliflozin  (JARDIANCE ) 25 MG TABS tablet Take 1 tablet (25 mg total) by mouth daily.   EPINEPHrine  (EPIPEN  2-PAK) 0.3 mg/0.3 mL IJ SOAJ injection Inject 0.3 mg into the muscle  as needed for anaphylaxis.   famotidine  (PEPCID ) 20 MG tablet TAKE 1 TABLET BY MOUTH TWICE A DAY   furosemide  (LASIX ) 20 MG tablet TAKE 2 TABLETS BY MOUTH DAILY AS NEEDED.   glucose blood (ONE TOUCH ULTRA TEST) test strip Use for blood sugar testing 1-3 times daily   HYDROcodone -acetaminophen  (NORCO/VICODIN) 5-325 MG tablet Take 1-2 tablets by mouth every 6 (six) hours as needed.   insulin  lispro (HUMALOG  KWIKPEN) 100 UNIT/ML KwikPen Inject 12 Units into the skin 2 (two) times daily before a meal.   Insulin  Pen Needle (BD PEN NEEDLE NANO U/F) 32G X 4 MM MISC USE AS DIRECTED WITH INSULIN  4  TIMES DAILY   Lancets MISC Use with blood sugar monitor to check daily   LANTUS  SOLOSTAR 100 UNIT/ML Solostar Pen INJECT SUBCUTANEOUSLY 40 UNITS  DAILY   levothyroxine  (SYNTHROID ) 100 MCG tablet Take 1 tablet (100 mcg total) by mouth every morning. 30 minutes before food   losartan  (COZAAR ) 25 MG tablet Take 1 tablet (25 mg total) by mouth daily.   Melatonin 3-10 MG TABS Take 3-5 mg by mouth at bedtime.   methocarbamol  (ROBAXIN ) 500 MG tablet Take 1 tablet (500 mg total) by mouth every 8 (eight)  hours as needed for muscle spasms. (Patient not taking: Reported on 05/21/2024)   omeprazole  (PRILOSEC) 20 MG capsule TAKE 1 CAPSULE BY MOUTH  TWICE DAILY   ondansetron  (ZOFRAN ) 4 MG tablet Take 1 tablet (4 mg total) by mouth every 8 (eight) hours as needed for nausea or vomiting.   polyethylene glycol powder (GLYCOLAX /MIRALAX ) powder TAKE 17 GRAMS BY MOUTH 2  TIMES DAILY AS NEEDED FOR  LAXATIVE   ramelteon  (ROZEREM ) 8 MG tablet Take 1 tablet (8 mg total) by mouth at bedtime.   rivaroxaban  (XARELTO ) 20 MG TABS tablet Take 1 tablet (20 mg total) by mouth daily with supper.   rosuvastatin  (CRESTOR ) 20 MG tablet TAKE 1 TABLET BY MOUTH EVERY DAY   Spacer/Aero-Holding Chambers (AEROCHAMBER PLUS) inhaler Use as instructed   tirzepatide  (MOUNJARO ) 5 MG/0.5ML Pen INJECT 5 MG SUBCUTANEOUSLY WEEKLY   traMADol  (ULTRAM ) 50 MG tablet Take 50 mg by mouth as needed.   Travoprost , BAK Free, (TRAVATAN ) 0.004 % SOLN ophthalmic solution Place 1 drop into both eyes at bedtime.   TRELEGY ELLIPTA  100-62.5-25 MCG/ACT AEPB INHALE 1 PUFF BY MOUTH EVERY DAY   No facility-administered encounter medications on file as of 06/20/2024.   Hearing/Vision screen Hearing Screening - Comments:: Denies hearing difficulties   Vision Screening - Comments:: Wears rx glasses - up to date with routine eye exams with  Dr Octavia Immunizations and Health Maintenance Health Maintenance  Topic Date Due   Zoster Vaccines- Shingrix (1 of 2) Never done   DTaP/Tdap/Td (2 - Tdap) 01/07/2012   Medicare Annual Wellness (AWV)  09/28/2023   OPHTHALMOLOGY EXAM  01/27/2024   Influenza Vaccine  03/08/2024   COVID-19 Vaccine (6 - 2025-26 season) 04/08/2024   FOOT EXAM  06/26/2024   HEMOGLOBIN A1C  09/06/2024   Diabetic kidney evaluation - Urine ACR  10/24/2024   Diabetic kidney evaluation - eGFR measurement  04/20/2025   Colonoscopy  03/02/2027   Pneumococcal Vaccine: 50+ Years  Completed   DEXA SCAN  Completed   Hepatitis C Screening   Completed   Meningococcal B Vaccine  Aged Out   Mammogram  Discontinued        Assessment/Plan:  This is a routine wellness examination for Inaaya.  Patient Care Team: Tharon Lung, MD as PCP -  General (Family Medicine) Croitoru, Jerel, MD as PCP - Cardiology (Cardiology) Croitoru, Jerel, MD as PCP - Electrophysiology (Cardiology) Levora Blackbird, DMD (Dentistry) Roz Anes, MD as Consulting Physician (Ophthalmology) Croitoru, Jerel, MD as Consulting Physician (Cardiology)  I have personally reviewed and noted the following in the patient's chart:   Medical and social history Use of alcohol, tobacco or illicit drugs  Current medications and supplements including opioid prescriptions. Functional ability and status Nutritional status Physical activity Advanced directives List of other physicians Hospitalizations, surgeries, and ER visits in previous 12 months Vitals Screenings to include cognitive, depression, and falls Referrals and appointments  No orders of the defined types were placed in this encounter.  In addition, I have reviewed and discussed with patient certain preventive protocols, quality metrics, and best practice recommendations. A written personalized care plan for preventive services as well as general preventive health recommendations were provided to patient.   Rojelio LELON Blush, LPN   [id number]   Return in 1 year (on 06/20/2025).  After Visit Summary: (MyChart) Due to this being a telephonic visit, the after visit summary with patients personalized plan was offered to patient via MyChart   Nurse Notes: None

## 2024-06-27 ENCOUNTER — Ambulatory Visit: Admitting: Family Medicine

## 2024-07-02 ENCOUNTER — Ambulatory Visit: Admitting: Pharmacist

## 2024-07-02 ENCOUNTER — Encounter: Payer: Self-pay | Admitting: Pharmacist

## 2024-07-02 ENCOUNTER — Ambulatory Visit (INDEPENDENT_AMBULATORY_CARE_PROVIDER_SITE_OTHER): Admitting: Family Medicine

## 2024-07-02 VITALS — BP 162/65 | HR 71 | Wt 135.4 lb

## 2024-07-02 VITALS — BP 170/70 | HR 71 | Ht 62.0 in | Wt 135.2 lb

## 2024-07-02 DIAGNOSIS — E785 Hyperlipidemia, unspecified: Secondary | ICD-10-CM | POA: Diagnosis not present

## 2024-07-02 DIAGNOSIS — L98491 Non-pressure chronic ulcer of skin of other sites limited to breakdown of skin: Secondary | ICD-10-CM | POA: Diagnosis not present

## 2024-07-02 DIAGNOSIS — Z794 Long term (current) use of insulin: Secondary | ICD-10-CM

## 2024-07-02 DIAGNOSIS — E119 Type 2 diabetes mellitus without complications: Secondary | ICD-10-CM

## 2024-07-02 DIAGNOSIS — Z23 Encounter for immunization: Secondary | ICD-10-CM | POA: Diagnosis not present

## 2024-07-02 DIAGNOSIS — I10 Essential (primary) hypertension: Secondary | ICD-10-CM

## 2024-07-02 LAB — POCT GLYCOSYLATED HEMOGLOBIN (HGB A1C): HbA1c, POC (controlled diabetic range): 8.2 % — AB (ref 0.0–7.0)

## 2024-07-02 MED ORDER — INSULIN LISPRO (1 UNIT DIAL) 100 UNIT/ML (KWIKPEN): PEN_INJECTOR | SUBCUTANEOUS | 11 refills | Status: DC

## 2024-07-02 MED ORDER — LOSARTAN POTASSIUM 50 MG PO TABS: 50.0000 mg | ORAL_TABLET | Freq: Every day | ORAL | 3 refills | Status: AC

## 2024-07-02 MED ORDER — ZOSTER VAC RECOMB ADJUVANTED 50 MCG/0.5ML IM SUSR: 0.5000 mL | Freq: Once | INTRAMUSCULAR | 0 refills | Status: AC

## 2024-07-02 NOTE — Assessment & Plan Note (Signed)
 Diabetes longstanding since 2008 currently progressing towards good control with GMI. Patient is able to verbalize appropriate hypoglycemia management plan. Medication adherence appears good. Control is suboptimal due to suboptimal regimen. -Continued Lantus  (insulin  glargine) at 20 units daily -Increased dose of Humalog  (insulin  lispro) to 12-15 units at lunch and 12 units with dinner -Continued Jardiance  (empagliflozin ) 25 mg daily -Continued Mounjaro  (tirzepatide ) 5 mg weekly -Patient educated on purpose, proper use, and potential adverse effects.  -Extensively discussed pathophysiology of diabetes, recommended lifestyle interventions, dietary effects on blood sugar control.  -Counseled on s/sx of and management of hypoglycemia.

## 2024-07-02 NOTE — Progress Notes (Signed)
 Reviewed and agree with Dr Rennis plan.

## 2024-07-02 NOTE — Assessment & Plan Note (Signed)
 ASCVD risk - primary in patient with diabetes. Last LDL is 41 at goal of <70 mg/dL. ASCVD risk factors include diabetes and 10-year ASCVD risk score of 36.2. High intensity statin indicated.  -Continued Rosuvastatin  20 mg.

## 2024-07-02 NOTE — Assessment & Plan Note (Signed)
 Hypertension longstanding since currently uncontrolled with in office Blood Pressure today >160/60 and wide pulse pressure. Blood pressure goal of <130/80  mmHg. Medication adherence good. Blood pressure control is suboptimal due to low dosages of antihypertensives. -Continued Carvedilol  6.25 mg BID -Increase dose of Losartan  to 50 mg daily

## 2024-07-02 NOTE — Assessment & Plan Note (Signed)
 A1c stable today at 8.2. - Continue insulin : 20 units Lantus , 12-15 units Humalog  at lunch, 12 units with dinner. - Encouraged dietary moderation, especially during Thanksgiving, and continued use of CGM - She has follow up with ophthalmology with Dr. Octavia at the first of the year for diabetic eye exam - Return for recheck in 3 months.

## 2024-07-02 NOTE — Patient Instructions (Addendum)
 VISIT SUMMARY: Today, we reviewed your diabetes and blood pressure management, addressed your mood and cognitive symptoms, and discussed the healing of your foot ulcer. We also updated your vaccinations and scheduled an eye exam.  YOUR PLAN: TYPE 2 DIABETES MELLITUS: Your blood sugar levels have been fluctuating, and we are working to stabilize them. -Continue your current insulin  regimen: 20 units of Lantus , 12-15 units of Humalog  at lunch, and 12 units at dinner. -Keep working on maintaining your blood glucose levels below 200 mg/dL. -We will review your A1c results once they are available. -Try to moderate your diet, especially during Thanksgiving.  HYPERTENSION: Your blood pressure has been elevated, possibly due to recent stress. -Continue taking the increased dosage of losartan .  PERSISTENT ATRIAL FIBRILLATION: Your condition is well-managed with your current treatment. -Continue with your current management and monitoring.  NON-PRESSURE CHRONIC ULCER OF LEG: Your foot ulcer is healing slowly. -Monitor the ulcer for any changes. -Report if there is no improvement so we can consider further intervention.  GENERAL HEALTH MAINTENANCE: We discussed and updated your vaccinations and scheduled an eye exam. -You received the flu and COVID vaccines today. -You have a prescription for the shingles vaccine. -Your eye examination is scheduled for January.  Please let me know if you have any other questions.  Dr. Tharon

## 2024-07-02 NOTE — Progress Notes (Signed)
 S:     Chief Complaint  Patient presents with   Medication Management    Diabetes management   78 y.o. female who presents for diabetes evaluation, education, and management. Patient arrives in  good spirits and presents with rolling walker.   Patient was referred and last seen by Primary Care Provider, Dr. Tharon, on 05/14/2024.    PMH is significant for Diabetes, Afib, Osteoporosis, hyperlipidemia, COPD, hypothyroidism, HFpEF.  Patient reports Diabetes was diagnosed in 2008.    Current diabetes medications include: Jardiance  (empagliflozin ) 25 mg daily, Lantus  (insulin  glargine) 20 units daily, Humalog  (insulin  lispro) 12 units BID with meals, Mounjaro  (tirzepatide ) 5 mg weekly Current hypertension medications include: Losartan  25 mg daily, carvedilol  6.26 mg BID Current hyperlipidemia medications include: Rosuvustatin 20 mg daily   Patient reports adherence to taking all medications as prescribed.   Insurance coverage: [facility] Medicare  Patient reports few hypoglycemic events usually in the morning before breakfast  Patient reports nocturia (nighttime urination) usually 2 times a night  Patient denies neuropathy (nerve pain). Patient reports visual changes. Patient reports self foot exams.   Patient reported dietary habits: Eats 2 meals/day Breakfast: oatmeal  Dinner: chicken, chicken broth  O:  Review of Systems  All other systems reviewed and are negative.  Physical Exam Vitals reviewed.  Constitutional:      Appearance: Normal appearance.  Neurological:     Mental Status: She is alert.  Psychiatric:        Mood and Affect: Mood normal.        Behavior: Behavior normal.        Thought Content: Thought content normal.        Judgment: Judgment normal.    Libre3 CGM Download today 06/19/24-07/02/24 % Time CGM is active: 97% Average Glucose: 172 mg/dL Glucose Management Indicator: 7.4  Glucose Variability: 26.2% (goal <36%) Time in Goal:  - Time in range  70-180: 62% - Time above range: 38% - Time below range: 7% Observed patterns: few highs at lunch time as patient reports eating her biggest meal of the day  Lab Results  Component Value Date   HGBA1C 8.1 (A) 03/06/2024   Vitals:   07/02/24 0947 07/02/24 0959  BP: (!) 161/75 (!) 162/65  Pulse:    SpO2:      Lipid Panel     Component Value Date/Time   CHOL 139 11/28/2023 1354   TRIG 253 (H) 11/28/2023 1354   HDL 46 11/28/2023 1354   CHOLHDL 3.0 11/28/2023 1354   CHOLHDL 3.9 11/24/2022 1215   VLDL 26 11/24/2022 1215   LDLCALC 53 11/28/2023 1354   LDLDIRECT 56 06/08/2012 1120   A/P: Diabetes longstanding since 2008 currently progressing towards good control with GMI. Patient is able to verbalize appropriate hypoglycemia management plan. Medication adherence appears good. Control is suboptimal due to suboptimal regimen. -Continued Lantus  (insulin  glargine) at 20 units daily -Increased dose of Humalog  (insulin  lispro) to 12-15 units at lunch and 12 units with dinner -Continued Jardiance  (empagliflozin ) 25 mg daily -Continued Mounjaro  (tirzepatide ) 5 mg weekly -Patient educated on purpose, proper use, and potential adverse effects.  -Extensively discussed pathophysiology of diabetes, recommended lifestyle interventions, dietary effects on blood sugar control.  -Counseled on s/sx of and management of hypoglycemia.   Hypertension longstanding since currently uncontrolled with in office Blood Pressure today >160/60 and wide pulse pressure. Blood pressure goal of <130/80  mmHg. Medication adherence good. Blood pressure control is suboptimal due to low dosages of antihypertensives. -Continued Carvedilol   6.25 mg BID -Increase dose of Losartan  to 50 mg daily  ASCVD risk - primary in patient with diabetes. Last LDL is 41 at goal of <70 mg/dL. ASCVD risk factors include diabetes and 10-year ASCVD risk score of 36.2. High intensity statin indicated.  -Continued Rosuvastatin  20 mg.    Written patient instructions provided. Patient verbalized understanding of treatment plan.  Total time in face to face counseling 27 minutes.    Follow-up:  Pharmacist 07/23/2024 PCP clinic visit today Patient seen with Lawson Mao, PharmD Candidate - [employer]Y3 student.

## 2024-07-02 NOTE — Progress Notes (Signed)
   SUBJECTIVE:   CHIEF COMPLAINT / HPI:  Discussed the use of AI scribe software for clinical note transcription with the patient, who gave verbal consent to proceed.  History of Present Illness Mercedes Barr is a 78 year old female with diabetes and hypertension who presents for a follow-up visit.  Glycemic control, T2DM - Insulin  regimen increased earlier today by Dr. Koval to 20 units of Lantus , 12 to 15 units of Humalog  at lunch, and 12 units Humalog  at dinner - Also taking Jardiance  and Mounjaro  - Actively working to maintain blood glucose levels below 200 mg/dL - She uses her CGM well and is able to better control her A1c with this - Has been more stressed given family dynamics and the holidays leading to expected worsening of A1c control  Blood pressure management - Losartan  dose has been increased today by Dr. Koval - Typically no issues with blood pressure control - Recent stress related to the holidays and absence of her children has contributed to elevated blood pressure, as well  OBJECTIVE:  BP (!) 170/70   Pulse 71   Ht 5' 2 (1.575 m)   Wt 135 lb 4 oz (61.3 kg)   BMI 24.74 kg/m   Physical Exam GENERAL: Alert, cooperative, well developed, no acute distress. CHEST: Clear to auscultation bilaterally, no wheezes, rhonchi, or crackles. CARDIOVASCULAR: Normal heart rate and rhythm, S1 and S2 normal without murmurs. EXTREMITIES: No cyanosis or edema. Great DP pulse bilaterally. Decreased sensation at the front and back of the L foot, including the ball of the right foot. Well-healing skin ulceration noted of the R anterior shin. NEUROLOGICAL: Cranial nerves grossly intact, moves all extremities without gross motor or sensory deficit.  ASSESSMENT/PLAN:   Assessment & Plan Type 2 diabetes mellitus without complication, with long-term current use of insulin  (HCC) A1c stable today at 8.2. - Continue insulin : 20 units Lantus , 12-15 units Humalog  at lunch, 12 units with  dinner. - Encouraged dietary moderation, especially during Thanksgiving, and continued use of CGM - She has follow up with ophthalmology with Dr. Octavia at the first of the year for diabetic eye exam - Return for recheck in 3 months. Essential hypertension Uncontrolled. Asymptomatic on exam. Increased losartan  dosage due to elevated blood pressure, possibly stress-related. - Continue losartan  50 mg daily - Update BMP today - Return in 1 week for recheck and repeat BMP Skin ulcer, limited to breakdown of skin (HCC) Ulcer healing slowly though does not appear infected, possibly due to vascular issues. - Monitor ulcer for improvement or deterioration. - Report lack of improvement for further intervention. Encounter for immunization COVID and flu vaccinations given today. Printed Rx for shingrix given to take to her pharmacy.  Stuart Redo, MD [facility]

## 2024-07-02 NOTE — Patient Instructions (Signed)
 It was nice to see you today!  Your goal blood sugar is 80-130 before eating and less than 180 after eating.  Medication Changes: Increase dose of short acting insulin  Humalog  (insulin  lispro) to 12-15 units at lunch and 12 units at dinner.  Increase dose of Losartan  to 50 mg daily. Discontinue Losartan  25 mg daily   Continue all other medication the same.  Keep up the good work with diet and exercise. Aim for a diet full of vegetables, fruit and lean meats (chicken, turkey, fish). Try to limit salt intake by eating fresh or frozen vegetables (instead of canned), rinse canned vegetables prior to cooking and do not add any additional salt to meals.

## 2024-07-02 NOTE — Assessment & Plan Note (Addendum)
 Uncontrolled. Asymptomatic on exam. Increased losartan  dosage due to elevated blood pressure, possibly stress-related. - Continue losartan  50 mg daily - Update BMP today - Return in 1 week for recheck and repeat BMP

## 2024-07-03 ENCOUNTER — Ambulatory Visit: Payer: Self-pay | Admitting: Family Medicine

## 2024-07-03 LAB — BASIC METABOLIC PANEL WITH GFR
BUN/Creatinine Ratio: 10 — ABNORMAL LOW (ref 12–28)
BUN: 7 mg/dL — ABNORMAL LOW (ref 8–27)
CO2: 26 mmol/L (ref 20–29)
Calcium: 9.4 mg/dL (ref 8.7–10.3)
Chloride: 103 mmol/L (ref 96–106)
Creatinine, Ser: 0.68 mg/dL (ref 0.57–1.00)
Glucose: 112 mg/dL — ABNORMAL HIGH (ref 70–99)
Potassium: 3.3 mmol/L — ABNORMAL LOW (ref 3.5–5.2)
Sodium: 146 mmol/L — ABNORMAL HIGH (ref 134–144)
eGFR: 90 mL/min/1.73 (ref >59)

## 2024-07-03 NOTE — Progress Notes (Signed)
 Spoke with patient about lab results. K mildly low. She will increase her intake with kale and other K-rich foods. She will stay hydrated. She is not feeling as well after her vaccines, but she will keep me updated on how she is feeling. Plan to recheck BMP next month.

## 2024-07-10 ENCOUNTER — Encounter: Payer: Self-pay | Admitting: Podiatry

## 2024-07-10 ENCOUNTER — Ambulatory Visit: Admitting: Podiatry

## 2024-07-10 ENCOUNTER — Other Ambulatory Visit: Payer: Self-pay | Admitting: Family Medicine

## 2024-07-10 DIAGNOSIS — E119 Type 2 diabetes mellitus without complications: Secondary | ICD-10-CM | POA: Diagnosis not present

## 2024-07-10 DIAGNOSIS — B351 Tinea unguium: Secondary | ICD-10-CM

## 2024-07-10 DIAGNOSIS — M79675 Pain in left toe(s): Secondary | ICD-10-CM | POA: Diagnosis not present

## 2024-07-10 DIAGNOSIS — Z794 Long term (current) use of insulin: Secondary | ICD-10-CM

## 2024-07-10 DIAGNOSIS — M79674 Pain in right toe(s): Secondary | ICD-10-CM

## 2024-07-10 NOTE — Progress Notes (Signed)
This patient returns to my office for at risk foot care.  This patient requires this care by a professional since this patient will be at risk due to having type 2 diabetes.  This patient is unable to cut nails herself since the patient cannot reach her nails.These nails are painful walking and wearing shoes.  This patient presents for at risk foot care today.  General Appearance  Alert, conversant and in no acute stress.  Vascular  Dorsalis pedis and posterior tibial  pulses are palpable  bilaterally.  Capillary return is within normal limits  bilaterally. Temperature is within normal limits  bilaterally.  Neurologic  Senn-Weinstein monofilament wire test within normal limits  bilaterally. Muscle power within normal limits bilaterally.  Nails Thick disfigured discolored nails with subungual debris  from hallux to fifth toes bilaterally. No evidence of bacterial infection or drainage bilaterally.  Orthopedic  No limitations of motion  feet .  No crepitus or effusions noted.  No bony pathology or digital deformities noted.  Midfoot arthritis.  Skin  normotropic skin with no porokeratosis noted bilaterally.  No signs of infections or ulcers noted.     Onychomycosis  Pain in right toes  Pain in left toes  Consent was obtained for treatment procedures.   Mechanical debridement of nails 1-5  bilaterally performed with a nail nipper.  Filed with dremel without incident.     Return office visit    3 months                  Told patient to return for periodic foot care and evaluation due to potential at risk complications.   Gardiner Barefoot DPM

## 2024-07-18 ENCOUNTER — Telehealth: Payer: Self-pay

## 2024-07-18 NOTE — Telephone Encounter (Signed)
 Patient calls nurse line requesting to speak with Dr. Koval in regards to CGM.   She reports she received a letter from her pharmacy stating there is a recall on [place] sensors. She reports this has made her very anxious.   Advised will forward to Dr. Koval.   Confirmed pharmacy clinic visit for next week.

## 2024-07-19 NOTE — Telephone Encounter (Signed)
 Patient contacted for follow-up of concern related to CGM recall.  Since last contact patient reports Mercedes Barr has experienced low CGM readings, without symptoms.  Mercedes Barr was concerned Mercedes Barr may have a defective/recalled sensor so Mercedes Barr discarded.   We discussed recall at length, the need to decide with any low glucose reading if the reading seemed appropriate.  At this time patient denies having supplies to recheck glucose by finger stick.   Mercedes Barr has replaced the sensor of concern and her current sensor is in range thus far.   Medication Plan: Glucose control seems appropriate.  - No change to current treatment plan.  Discussed caution with interpreting low readings.  Asked patient to return call to office if Mercedes Barr believes Mercedes Barr has incorrect readings again while Mercedes Barr uses her two remaining sensors in hand.   Total time with patient call and documentation of interaction: 13 minutes.

## 2024-07-20 ENCOUNTER — Other Ambulatory Visit: Payer: Self-pay | Admitting: Family Medicine

## 2024-07-20 DIAGNOSIS — R1013 Epigastric pain: Secondary | ICD-10-CM

## 2024-07-22 ENCOUNTER — Other Ambulatory Visit: Payer: Self-pay | Admitting: Family Medicine

## 2024-07-22 DIAGNOSIS — E119 Type 2 diabetes mellitus without complications: Secondary | ICD-10-CM

## 2024-07-23 ENCOUNTER — Ambulatory Visit: Admitting: Pharmacist

## 2024-07-31 ENCOUNTER — Ambulatory Visit: Admitting: Family Medicine

## 2024-07-31 VITALS — BP 126/83 | HR 70 | Wt 132.0 lb

## 2024-07-31 DIAGNOSIS — Z0000 Encounter for general adult medical examination without abnormal findings: Secondary | ICD-10-CM | POA: Diagnosis not present

## 2024-07-31 DIAGNOSIS — E119 Type 2 diabetes mellitus without complications: Secondary | ICD-10-CM

## 2024-07-31 DIAGNOSIS — Z794 Long term (current) use of insulin: Secondary | ICD-10-CM

## 2024-07-31 DIAGNOSIS — I10 Essential (primary) hypertension: Secondary | ICD-10-CM | POA: Diagnosis not present

## 2024-07-31 DIAGNOSIS — J42 Unspecified chronic bronchitis: Secondary | ICD-10-CM

## 2024-07-31 DIAGNOSIS — I493 Ventricular premature depolarization: Secondary | ICD-10-CM

## 2024-07-31 DIAGNOSIS — E039 Hypothyroidism, unspecified: Secondary | ICD-10-CM | POA: Diagnosis not present

## 2024-07-31 MED ORDER — BENZONATATE 100 MG PO CAPS: 100.0000 mg | ORAL_CAPSULE | Freq: Three times a day (TID) | ORAL | 0 refills | Status: DC | PRN

## 2024-07-31 NOTE — Assessment & Plan Note (Signed)
 Stable. Continue current medications. Will send in benzonatate  for prn use cough given efficacy in the past.

## 2024-07-31 NOTE — Assessment & Plan Note (Signed)
 Blood pressure managed with increased losartan  dose. No adverse effects reported. - Continue current dose of losartan  at 50 mg daily. - Ordered blood work to monitor kidney function.

## 2024-07-31 NOTE — Progress Notes (Signed)
" ° °  SUBJECTIVE:   CHIEF COMPLAINT / HPI:  Discussed the use of AI scribe software for clinical note transcription with the patient, who gave verbal consent to proceed.  History of Present Illness Mercedes Barr is a 78 year old female with diabetes who presents with concerns about inaccurate glucose sensor readings.  Glycemic control and glucose sensor accuracy - Falsely elevated glucose sensor readings, including 246 mg/dL while fasting at home and 248 mg/dL in clinic today - Taking diabetes medications as prescribed - Last hemoglobin A1c approximately 8, checked last month  Hypertension management - On losartan  with recent dose increase for blood pressure control - No dizziness, chest pain, palpitations, or other blood pressure related symptoms - Feels well overall   OBJECTIVE:  BP 126/83   Pulse 70   Wt 132 lb (59.9 kg)   SpO2 98%   BMI 24.14 kg/m   Physical Exam GENERAL: Alert, cooperative, well developed, no acute distress. CHEST: Clear to auscultation bilaterally, no wheezes, rhonchi, or crackles. CARDIOVASCULAR: Normal heart rate, occasional PVCs otherwise normal rhythm, S1 and S2 normal without murmurs. EXTREMITIES: No cyanosis or edema. NEUROLOGICAL: Cranial nerves grossly intact, moves all extremities without gross motor or sensory deficit.  ASSESSMENT/PLAN:   Assessment & Plan Type 2 diabetes mellitus without complication, with long-term current use of insulin  (HCC) Persistent hyperglycemia despite fasting and medication adherence. Possible glucose sensor or transmitter malfunction given recall of devices. Average glucose for 8.2% A1c = 180s. She is asymptomatic today. - Ordered blood glucose test to verify sensor accuracy. - Consider sending new transmitter for glucose sensor if blood glucose matches sensor readings. - Continue current insulin  regimen. - Plan for follow-up appointment in February to recheck A1c. Hypothyroidism, unspecified type Previously  suppressed TSH. On synthroid . Routine thyroid  function monitoring required. - Ordered thyroid  function tests. Essential hypertension Blood pressure managed with increased losartan  dose. No adverse effects reported. - Continue current dose of losartan  at 50 mg daily. - Ordered blood work to monitor kidney function. PVC (premature ventricular contraction) PVCs present without symptoms. These have been seen on recent pacemaker remote device check as well. - Advised to report symptoms such as chest pain or palpitations. - Continue to follow with cardiology. Chronic bronchitis, unspecified chronic bronchitis type (HCC) Stable. Continue current medications. Will send in benzonatate  for prn use cough given efficacy in the past. Healthcare maintenance Routine health maintenance discussed. - Continue routine podiatry visits every three months. Foot exam entered. - Schedule follow-up with ophthalmologist on January 13th.  Stuart Redo, MD [facility]  "

## 2024-07-31 NOTE — Assessment & Plan Note (Signed)
 Persistent hyperglycemia despite fasting and medication adherence. Possible glucose sensor or transmitter malfunction given recall of devices. Average glucose for 8.2% A1c = 180s. She is asymptomatic today. - Ordered blood glucose test to verify sensor accuracy. - Consider sending new transmitter for glucose sensor if blood glucose matches sensor readings. - Continue current insulin  regimen. - Plan for follow-up appointment in February to recheck A1c.

## 2024-07-31 NOTE — Assessment & Plan Note (Signed)
 Routine health maintenance discussed. - Continue routine podiatry visits every three months. Foot exam entered. - Schedule follow-up with ophthalmologist on January 13th.

## 2024-07-31 NOTE — Patient Instructions (Signed)
 VISIT SUMMARY: During your visit, we discussed concerns about your glucose sensor readings and reviewed your diabetes, blood pressure, and thyroid  management. We also talked about your general health maintenance and scheduled necessary follow-ups.  YOUR PLAN: TYPE 2 DIABETES MELLITUS WITH HYPERGLYCEMIA: You have been experiencing high blood sugar levels despite fasting and taking your medications as prescribed. There may be an issue with your glucose sensor. -We ordered a blood glucose test to check the accuracy of your glucose sensor. -If the blood glucose test matches the sensor readings, we will send you a new transmitter for your glucose sensor.  ESSENTIAL HYPERTENSION: Your blood pressure is being managed with an increased dose of losartan , and you have not reported any side effects. -Continue taking your current dose of losartan . -We ordered blood work to monitor your kidney function.  HYPOTHYROIDISM: Routine monitoring of your thyroid  function is necessary. -We ordered thyroid  function tests.  PREMATURE VENTRICULAR CONTRACTIONS (PVCS): You have PVCs but are not experiencing any symptoms. -Please report any symptoms such as chest pain or palpitations.  GENERAL HEALTH MAINTENANCE: We discussed your routine health maintenance needs. -Continue your routine podiatry visits every three months. -Schedule a follow-up with your ophthalmologist on January 13th. -Plan for a follow-up appointment in February to recheck your A1c.  Please let me know if you have any other questions.  Dr. Tharon

## 2024-07-31 NOTE — Assessment & Plan Note (Signed)
 Previously suppressed TSH. On synthroid . Routine thyroid  function monitoring required. - Ordered thyroid  function tests.

## 2024-08-01 ENCOUNTER — Ambulatory Visit: Payer: Self-pay | Admitting: Family Medicine

## 2024-08-01 LAB — BASIC METABOLIC PANEL WITH GFR
BUN/Creatinine Ratio: 18 (ref 12–28)
BUN: 15 mg/dL (ref 8–27)
CO2: 26 mmol/L (ref 20–29)
Calcium: 9.6 mg/dL (ref 8.7–10.3)
Chloride: 102 mmol/L (ref 96–106)
Creatinine, Ser: 0.84 mg/dL (ref 0.57–1.00)
Glucose: 246 mg/dL — ABNORMAL HIGH (ref 70–99)
Potassium: 3.3 mmol/L — ABNORMAL LOW (ref 3.5–5.2)
Sodium: 145 mmol/L — ABNORMAL HIGH (ref 134–144)
eGFR: 72 mL/min/1.73

## 2024-08-01 LAB — T4F: T4,Free (Direct): 1.77 ng/dL (ref 0.82–1.77)

## 2024-08-01 LAB — TSH RFX ON ABNORMAL TO FREE T4: TSH: 0.158 u[IU]/mL — ABNORMAL LOW (ref 0.450–4.500)

## 2024-08-01 MED ORDER — LEVOTHYROXINE SODIUM 88 MCG PO TABS: 88.0000 ug | ORAL_TABLET | ORAL | 0 refills | Status: AC

## 2024-08-06 NOTE — Telephone Encounter (Signed)
 Patient calls nurse line requesting lab results.   Results discussed with patient.  Advised to continue with current CGM. She reports her sugar this am was 243. Advised if she experiences sugars 250 and above for a considerable amount of time to call for an apt.   ED precautions discussed for hyperglycemia.   Discussed TSH and the need to stop 100mcg and start 88mcg. Advised this was sent to the pharmacy.   All questions answered.

## 2024-08-13 ENCOUNTER — Other Ambulatory Visit: Payer: Self-pay | Admitting: Family Medicine

## 2024-08-13 MED ORDER — MOUNJARO 7.5 MG/0.5ML ~~LOC~~ SOAJ: 7.5000 mg | SUBCUTANEOUS | 1 refills | Status: AC

## 2024-08-13 MED ORDER — MOUNJARO 7.5 MG/0.5ML ~~LOC~~ SOAJ: 7.5000 mg | SUBCUTANEOUS | 1 refills | Status: DC

## 2024-08-13 NOTE — Progress Notes (Signed)
 Spoke with patient about her test results given unread MyChart message. She will take the 88 mcg synthroid . She will eat some potassium-rich foods. For her CGM accuracy, I described it was likely accurate. We will increase Mounjaro  to 7.5 mg weekly after shared decision making since she is tolerating the 5 mg weekly well. All other questions answered.

## 2024-08-15 ENCOUNTER — Telehealth: Payer: Self-pay | Admitting: Family Medicine

## 2024-08-15 NOTE — Telephone Encounter (Signed)
 Appointment scheduled for 1/14 at 10:30 AM with Koval for T2DM and CGM concerns. She will see me afterward at 11:10 AM for itching. This has recurred with her stress, is entire body, and keeps her up at night. She will go up on Zyrtec  and Pepcid  to BID instead of daily dosing. She is aware of higher risk of sedation with Zyrtec  BID dosing and will keep me updated if worsening before she sees me next week.

## 2024-08-20 LAB — OPHTHALMOLOGY REPORT-SCANNED

## 2024-08-21 ENCOUNTER — Ambulatory Visit: Payer: Self-pay | Admitting: Family Medicine

## 2024-08-21 ENCOUNTER — Encounter: Payer: Self-pay | Admitting: Family Medicine

## 2024-08-21 ENCOUNTER — Ambulatory Visit: Admitting: Pharmacist

## 2024-08-21 ENCOUNTER — Ambulatory Visit

## 2024-08-21 ENCOUNTER — Encounter: Payer: Self-pay | Admitting: Pharmacist

## 2024-08-21 VITALS — BP 175/64 | HR 61 | Wt 135.8 lb

## 2024-08-21 VITALS — BP 180/62 | HR 61 | Wt 135.8 lb

## 2024-08-21 DIAGNOSIS — E119 Type 2 diabetes mellitus without complications: Secondary | ICD-10-CM

## 2024-08-21 DIAGNOSIS — I5032 Chronic diastolic (congestive) heart failure: Secondary | ICD-10-CM | POA: Diagnosis not present

## 2024-08-21 DIAGNOSIS — Z794 Long term (current) use of insulin: Secondary | ICD-10-CM | POA: Diagnosis not present

## 2024-08-21 DIAGNOSIS — F339 Major depressive disorder, recurrent, unspecified: Secondary | ICD-10-CM | POA: Diagnosis not present

## 2024-08-21 DIAGNOSIS — R053 Chronic cough: Secondary | ICD-10-CM | POA: Diagnosis not present

## 2024-08-21 DIAGNOSIS — I10 Essential (primary) hypertension: Secondary | ICD-10-CM | POA: Diagnosis not present

## 2024-08-21 DIAGNOSIS — L299 Pruritus, unspecified: Secondary | ICD-10-CM | POA: Diagnosis not present

## 2024-08-21 DIAGNOSIS — H259 Unspecified age-related cataract: Secondary | ICD-10-CM

## 2024-08-21 DIAGNOSIS — E11319 Type 2 diabetes mellitus with unspecified diabetic retinopathy without macular edema: Secondary | ICD-10-CM | POA: Insufficient documentation

## 2024-08-21 DIAGNOSIS — H269 Unspecified cataract: Secondary | ICD-10-CM | POA: Insufficient documentation

## 2024-08-21 MED ORDER — EPLERENONE 25 MG PO TABS: 25.0000 mg | ORAL_TABLET | Freq: Every day | ORAL | 0 refills | Status: DC

## 2024-08-21 MED ORDER — GUAIFENESIN-CODEINE 100-10 MG/5ML PO SOLN: 5.0000 mL | Freq: Three times a day (TID) | ORAL | 0 refills | Status: AC | PRN

## 2024-08-21 MED ORDER — INSULIN LISPRO (1 UNIT DIAL) 100 UNIT/ML (KWIKPEN): 6.0000 [IU] | PEN_INJECTOR | Freq: Two times a day (BID) | SUBCUTANEOUS | Status: AC

## 2024-08-21 MED ORDER — LANTUS SOLOSTAR 100 UNIT/ML ~~LOC~~ SOPN: 10.0000 [IU] | PEN_INJECTOR | Freq: Every day | SUBCUTANEOUS | Status: AC

## 2024-08-21 MED ORDER — BUPROPION HCL ER (XL) 150 MG PO TB24: 150.0000 mg | ORAL_TABLET | Freq: Every day | ORAL | 0 refills | Status: AC

## 2024-08-21 MED ORDER — CITALOPRAM HYDROBROMIDE 20 MG PO TABS: 20.0000 mg | ORAL_TABLET | Freq: Every day | ORAL | Status: AC

## 2024-08-21 MED ORDER — CETIRIZINE HCL 10 MG PO TABS: 10.0000 mg | ORAL_TABLET | Freq: Two times a day (BID) | ORAL | Status: AC

## 2024-08-21 NOTE — Assessment & Plan Note (Signed)
 Mood swings, previously on bupropion . Discussed restarting bupropion  due to current stress and mood issues. - Prescribed bupropion  150 mg.

## 2024-08-21 NOTE — Patient Instructions (Addendum)
 It was nice to see you today!  Your CGM report looks great!   Please contact our office if your readings are < 100 consistently and you are concerned for low glucose symptoms.   Your goal glucose value is 80-130 before eating and less than 180 after eating.  Medication Changes: Continue all other medication the same.   Keep up the good work with diet and exercise. Aim for a diet full of vegetables, fruit and lean meats (chicken, turkey, fish). Try to limit salt intake by eating fresh or frozen vegetables (instead of canned), rinse canned vegetables prior to cooking and do not add any additional salt to meals.    Please bring all medications to your clinic visits.  Please arrive 10-15 minutes prior to your scheduled visit time.

## 2024-08-21 NOTE — Progress Notes (Signed)
 "   S:     Chief Complaint  Patient presents with   Medication Management    Diabetes follow-up   79 y.o. female who presents for diabetes evaluation, education, and management. Patient arrives in good spirits and presents with assistance from roller walker.   Patient was last seen by Primary Care Provider, Dr. Tharon, on 07/31/24. At last visit, patient voiced concerns about inaccurate glucose sensor readings.   PMH is significant for Diabetes, Afib, Osteoporosis, hyperlipidemia, COPD, hypothyroidism, HFpEF.  Patient reports Diabetes was diagnosed in 2008.  Current diabetes medications include: Jardiance  (empagliflozin ) 25 mg daily, Lantus  (insulin  glargine) 20 units daily, Humalog  (insulin  lispro) 12 units BID with meals, Mounjaro  (tirzepatide ) 7.5 mg weekly  Current hypertension medications include: carvedilol  6.25 mg BID, losartan  50 mg daily Current hyperlipidemia medications include: rosuvastatin  20 mg daily   Patient reports adherence to taking all medications as prescribed. Reports sometimes decreasing dose of Lantus  (insulin  glargine) to 10 units and Humalog  (insulin  lispro) to 6 units with meals if sugar is < 130-140.   Do you feel that your medications are working for you? yes Have you been experiencing any side effects to the medications prescribed? no Do you have any problems obtaining medications due to transportation or finances? no Insurance coverage: Medicaid  Patient reported dietary habits: Reports eating a lot of broth with potatoes, vegetables, and chicken for lunch/dinner. Reports cutting back on grits.  Breakfast: oatmeal  Snacks: has greek yogurt every day; Jello for dessert  Drinks: ginger ale, water   Patient-reported exercise habits: patient reports walking around stores, can make it 15 minutes before needing to sit down due to knee pain   O:   Review of Systems  Respiratory:  Negative for shortness of breath.   Cardiovascular:  Negative for leg  swelling.  All other systems reviewed and are negative.  Physical Exam Vitals reviewed.  Constitutional:      Appearance: Normal appearance.  Pulmonary:     Effort: Pulmonary effort is normal.  Neurological:     Mental Status: She is alert.  Psychiatric:        Mood and Affect: Mood normal.        Behavior: Behavior normal.        Thought Content: Thought content normal.        Judgment: Judgment normal.    7 day average glucose: 08/16/24-08/29/24  Libre3 CGM Download today  % Time CGM is active: 39% Average Glucose: 165 mg/dL Glucose Management Indicator: 7.3%  Glucose Variability: 20.7% (goal <36%) Time in Goal:  - Time in range 70-180: 70% - Time above range: 30% - Time below range: 0% Observed patterns:  Lab Results  Component Value Date   HGBA1C 8.2 (A) 07/02/2024   Vitals:   08/21/24 1039 08/21/24 1043  BP: (!) 179/70 (!) 175/64  Pulse: 61   SpO2: 98%     Lipid Panel     Component Value Date/Time   CHOL 139 11/28/2023 1354   TRIG 253 (H) 11/28/2023 1354   HDL 46 11/28/2023 1354   CHOLHDL 3.0 11/28/2023 1354   CHOLHDL 3.9 11/24/2022 1215   VLDL 26 11/24/2022 1215   LDLCALC 53 11/28/2023 1354   LDLDIRECT 56 06/08/2012 1120    Clinical Atherosclerotic Cardiovascular Disease (ASCVD): Yes The 10-year ASCVD risk score (Arnett DK, et al., 2019) is: 35.3%   Values used to calculate the score:     Age: 32 years     Clinically relevant sex: Female  Is Non-Hispanic African American: Yes     Diabetic: Yes     Tobacco smoker: No     Systolic Blood Pressure: 175 mmHg     Is BP treated: Yes     HDL Cholesterol: 46 mg/dL     Total Cholesterol: 139 mg/dL  Lab Results  Component Value Date   CHOL 139 11/28/2023   HDL 46 11/28/2023   LDLCALC 53 11/28/2023   LDLDIRECT 56 06/08/2012   TRIG 253 (H) 11/28/2023   CHOLHDL 3.0 11/28/2023    Lab Results  Component Value Date   CREATININE 0.84 07/31/2024   BUN 15 07/31/2024   NA 145 (H) 07/31/2024   K 3.3  (L) 07/31/2024   CL 102 07/31/2024   CO2 26 07/31/2024    Medications Reviewed Today     Reviewed by Amalia Maude MATSU, RPH-CPP (Pharmacist) on 08/21/24 at 1034  Med List Status: <None>   Medication Order Taking? Sig Documenting Provider Last Dose Status Informant  acetaminophen  (TYLENOL ) 325 MG tablet [id number] Yes Take 2 tablets (650 mg total) by mouth every 6 (six) hours as needed. Tharon Lung, MD  Active   albuterol  (PROVENTIL ) (2.5 MG/3ML) 0.083% nebulizer solution [id number] Yes TAKE 3 ML (2.5 MG TOTAL) BY NEBULIZATION EVERY 4 HOURS AS NEEDED FOR WHEEZING OR SHORTNESS OF SHERIDA Tharon Lung, MD  Active   albuterol  (VENTOLIN  HFA) 108 (90 Base) MCG/ACT inhaler [id number] Yes INHALE 2 PUFFS EVERY 4 HOURS AS NEEDED FOR WHEEZING Lilland, Alana, DO  Active            Med Note (CRUTHIS, CHLOE C   Fri Nov 18, 2022  8:42 AM)    Baclofen  5 MG TABS [id number] Yes TAKE 1 TABLET (5 MG TOTAL) BY MOUTH DAILY AS NEEDED (MUSCLE SPASMS). Tharon Lung, MD  Active   BD PEN NEEDLE NANO 2ND GEN 32G X 4 MM MISC [id number]  USE AS DIRECTED WITH INSULIN  4 TIMES DAILY Tharon Lung, MD  Active    Patient not taking:   Discontinued 08/21/24 1026 (Patient Preference)            Med Note>> Marquel Spoto G, RPH-CPP   08/21/2024 10:26 AM patient said makes her stomach hurt    buPROPion  (WELLBUTRIN  XL) 300 MG 24 hr tablet [id number] Yes TAKE 1 TABLET BY MOUTH EVERY DAY Tharon Lung, MD  Active   carvedilol  (COREG ) 6.25 MG tablet [id number] Yes Take 1 tablet (6.25 mg total) by mouth 2 (two) times daily with a meal. Tharon Lung, MD  Active   cetirizine  (ZYRTEC  ALLERGY) 10 MG tablet [id number] Yes Take 1 tablet (10 mg total) by mouth daily. Lilland, Alana, DO  Active   citalopram  (CELEXA ) 40 MG tablet [id number] Yes TAKE 1 TABLET BY MOUTH EVERY DAY Tharon Lung, MD  Active   Continuous Glucose Sensor (FREESTYLE LIBRE 3 PLUS SENSOR) MISC [id number]  Change sensor every 15 days. McDiarmid, Krystal BIRCH, MD  Active   diclofenac  Sodium  (VOLTAREN ) 1 % GEL [id number] Yes APPLY 2 GRAMS TO AFFECTED AREA 4 TIMES A DAY Lilland, Alana, DO  Active   empagliflozin  (JARDIANCE ) 25 MG TABS tablet [id number] Yes Take 1 tablet (25 mg total) by mouth daily. Tharon Lung, MD  Active   EPINEPHrine  (EPIPEN  2-PAK) 0.3 mg/0.3 mL IJ SOAJ injection [id number]  Inject 0.3 mg into the muscle as needed for anaphylaxis. Tharon Lung, MD  Active   famotidine  (PEPCID ) 20 MG tablet [id number] Yes TAKE 1 TABLET BY MOUTH TWICE A DAY Tharon Lung, MD  Active   furosemide  (LASIX ) 20 MG tablet [id number] Yes TAKE 2 TABLETS BY MOUTH DAILY AS NEEDED. Tharon Lung, MD  Active   glucose blood (ONE TOUCH ULTRA TEST) test strip [id number]  Use for blood sugar testing 1-3 times daily Dempsey Coy, MD  Active   HYDROcodone -acetaminophen  (NORCO/VICODIN) 5-325 MG tablet [id number]  Take 1-2 tablets by mouth every 6 (six) hours as needed.  Patient not taking: Reported on 08/21/2024   Patsey Lot, MD  Active   insulin  lispro (HUMALOG  [facility]) 100 UNIT/ML KwikPen [id number] Yes 12-15 units with lunch and 12 units at dinner. McDiarmid, Krystal BIRCH, MD  Active   Lancets MISC [id number] Yes Use with blood sugar monitor to check daily Delores Suzann HERO, MD  Active   LANTUS  SOLOSTAR 100 UNIT/ML Solostar Pen [id number] Yes INJECT SUBCUTANEOUSLY 40 UNITS  DAILY Lilland, Alana, DO  Active            Med Note MAUDIE, COY KANDICE [place] Aug 21, 2024 10:31 AM) Taking 10-20 units in the morning  levothyroxine  (SYNTHROID ) 88 MCG tablet [id number] Yes Take 1 tablet (88 mcg total) by mouth every morning. 30 minutes before food Tharon Lung, MD  Active   losartan  (COZAAR ) 50 MG tablet [id number] Yes Take 1 tablet (50 mg total) by mouth daily. McDiarmid, Krystal BIRCH, MD  Active   Melatonin 3-10 MG TABS [id number]  Take 3-5 mg by mouth at bedtime.  Patient not taking: Reported on 08/21/2024   Tharon Lung, MD  Active   methocarbamol  (ROBAXIN ) 500 MG tablet [id number] Yes Take 1 tablet (500 mg total) by mouth every 8  (eight) hours as needed for muscle spasms. Patsey Lot, MD  Active   omeprazole  (PRILOSEC) 20 MG capsule [id number] Yes TAKE 1 CAPSULE BY MOUTH  TWICE DAILY Dempsey Coy, MD  Active   ondansetron  (ZOFRAN ) 4 MG tablet [id number] Yes Take 1 tablet (4 mg total) by mouth every 8 (eight) hours as needed for nausea or vomiting. Rosendo Norleen BROCKS, MD  Active   polyethylene glycol powder (GLYCOLAX /MIRALAX ) powder [id number] Yes TAKE 17 GRAMS BY MOUTH 2  TIMES DAILY AS NEEDED FOR  LAXATIVE Diallo, Abdoulaye, MD  Active            Med Note (CRUTHIS, CHLOE C   Fri Nov 18, 2022  8:42 AM)    ramelteon  (ROZEREM ) 8 MG tablet [id number]  Take 1 tablet (8 mg total) by mouth at bedtime.  Patient not taking: Reported on 08/21/2024   Tharon Lung, MD  Active   rivaroxaban  (XARELTO ) 20 MG TABS tablet [id number] Yes Take 1 tablet (20 mg total) by mouth daily with supper. Tharon Lung, MD  Active   rosuvastatin  (CRESTOR ) 20 MG tablet [id number] Yes TAKE 1 TABLET BY MOUTH EVERY DAY Tharon Lung, MD  Active   Spacer/Aero-Holding Chambers (AEROCHAMBER PLUS) inhaler [id number]  Use as instructed Van Knee, MD  Active   tirzepatide  (MOUNJARO ) 7.5 MG/0.5ML Pen [id number] Yes Inject 7.5 mg into the skin once a week. Tharon Lung, MD  Active   traMADol  (ULTRAM ) 50 MG tablet [id number] Yes Take 50 mg by mouth as needed. [provider]  Active   Travoprost , BAK Free, (TRAVATAN ) 0.004 % SOLN ophthalmic solution [id number] Yes Place 1 drop into both eyes at bedtime. Curtis Hadassah DASEN, MD  Active Self  TRELEGY ELLIPTA  100-62.5-25 MCG/ACT AEPB [id number] Yes INHALE 1 PUFF BY MOUTH EVERY DAY Tharon Lung, MD  Active  Patient is participating in a Managed Medicaid Plan:  Yes   A/P: Diabetes longstanding currently controlled, GMI 7.3%. Patient is able to verbalize appropriate hypoglycemia management plan, reports having orange juice when needed. Medication adherence appears good. Minimal low glucose,  continue without adjustment to patient's current management plan.  - Continued basal insulin  Lantus  (insulin  glargine) 10-20 units daily.  - Continued rapid insulin  Humalog  (insulin  lispro) 6-12 units twice daily with meals.   - Continued GLP-1 Mounjaro  (tirzepatide ) 7.5 mg weekly (recently increased)  - Continued SGLT2-I Jardiance  (empagliflozin ) 25 mg. Counseled on sick day rules. - Patient educated on purpose, proper use, and potential adverse effects.  - Extensively discussed pathophysiology of diabetes, recommended lifestyle interventions, dietary effects on glucose control.  - Counseled on s/sx of and management of hypoglycemia.   ASCVD risk - Primary prevention in patient with diabetes. Last LDL 56 mg/dl is at goal of <29 mg/dL. High intensity statin indicated.  - Continued rosuvastatin  20 mg daily.   Hypertension longstanding currently uncontrolled. Blood pressure goal of <130/80 mmHg. Medication adherence good.  - Continued losartan  50 mg. - Continued carvedilol  6.25 mg BID - With Heart Failure - discussed option of starting eplerenone  25mg  once daily with PCP (Dr. Tharon, also seeing patient today).   Written patient instructions provided. Patient verbalized understanding of treatment plan.  Total time in face to face counseling 33 minutes.    Follow-up:  Pharmacist visit TBD PCP clinic visit today Patient seen with Sabra Schuller, PharmD Candidate - PY2 student and Megan McGill, PharmD Candidate - PY4 student.    "

## 2024-08-21 NOTE — Assessment & Plan Note (Signed)
 Blood pressure elevated at SBP 175-180 mmHg, likely stress-related while being in office though has been persistent over multiple visits. Discussed adding eplerenone  to manage blood pressure and support heart function. Eplerenone  may increase potassium levels, requiring monitoring. - Prescribed eplerenone  at a low dose 25 mg daily, appreciate Dr. Rennis help. - Schedule lab visit in one week to check potassium levels.

## 2024-08-21 NOTE — Patient Instructions (Signed)
 VISIT SUMMARY: During your visit, we discussed your concerns about cataract surgery, elevated blood pressure, persistent itching, and chronic cough. We also reviewed your current medications and made adjustments to better manage your conditions.  YOUR PLAN: OCULAR HEALTH AND CATARACT SURGERY CONCERNS: You are considering cataract surgery and are concerned about the impact of glaucoma on your vision and the risk of blindness. -Continue using Travaprost eye drops for glaucoma.  ESSENTIAL HYPERTENSION: Your blood pressure readings have been elevated recently. -We prescribed eplerenone  at a low dose to help manage your blood pressure and support heart function. -Schedule a nurse visit in one week to check your potassium levels.  GENERALIZED PRURITUS: You have been experiencing persistent itching despite taking Zyrtec  and Pepcid . -We ordered CBC and liver function tests to investigate the cause. -We ordered a chest x-ray to rule out nodules or other causes. -Continue taking Zyrtec  and Pepcid  twice daily.  CHRONIC COUGH: You have a persistent cough, especially at night, that has not been relieved by Mucinex . -We ordered a chest x-ray to investigate the cause. -We prescribed codeine  cough syrup, 5 mL every 8 hours as needed.  CHRONIC HEART FAILURE: You have a history of lower heart function, which is now normalized. -We prescribed eplerenone  at a low dose to support heart function and manage blood pressure.  TYPE 2 DIABETES MELLITUS: Your diabetes management is ongoing with Jardiance  and insulin . -Continue your current diabetes management regimen.  DEPRESSION: You have been experiencing mood swings and stress. -We prescribed bupropion  150 mg to help manage your mood.  GENERAL HEALTH MAINTENANCE: We discussed your upcoming dental procedure and the management of blood thinners. -Obtain a clearance form from your dentist for the dental procedure.  Please let me know if you have any other  questions.  Dr. Tharon

## 2024-08-21 NOTE — Assessment & Plan Note (Signed)
 Diabetes longstanding currently controlled, GMI 7.3%. Patient is able to verbalize appropriate hypoglycemia management plan, reports having orange juice when needed. Medication adherence appears good. Minimal low glucose, continue without adjustment to patient's current management plan.  - Continued basal insulin  Lantus  (insulin  glargine) 10-20 units daily.  - Continued rapid insulin  Humalog  (insulin  lispro) 6-12 units twice daily with meals.   - Continued GLP-1 Mounjaro  (tirzepatide ) 7.5 mg weekly (recently increased)  - Continued SGLT2-I Jardiance  (empagliflozin ) 25 mg. Counseled on sick day rules. - Patient educated on purpose, proper use, and potential adverse effects.  - Extensively discussed pathophysiology of diabetes, recommended lifestyle interventions, dietary effects on glucose control.  - Counseled on s/sx of and management of hypoglycemia.

## 2024-08-21 NOTE — Progress Notes (Signed)
" ° °  SUBJECTIVE:   CHIEF COMPLAINT / HPI:  Discussed the use of AI scribe software for clinical note transcription with the patient, who gave verbal consent to proceed.  History of Present Illness REGANA KEMPLE is a 79 year old female with hypertension and glaucoma who presents with concerns about cataract surgery and elevated blood pressure.  Ocular health and cataract surgery concerns - Considering cataract surgery and expresses concern about the impact of glaucoma on vision and risk of blindness. - Currently using Travaprost eye drops for glaucoma. - Has close follow up with her ophthalmologist.  Hypertension - Recent elevated blood pressure readings of 175/80 and 180/80 while taking losartan  50 mg daily here in office. - No headache or shortness of breath. - History of lower heart function now recovered to normal EF - Current antihypertensive medications include losartan  50 mg daily, Coreg , Lasix  for edema  Pruritus - Persistent itching not improved with Zyrtec  10 mg twice daily and Pepcid  twice daily. - No new abdominal pain. - Happened before and recurred again  Thyroid  function - Levothyroxine  dose recently decreased from 100 mcg to 88 mcg.  Chronic cough - Persistent cough, worse at night, with thick mucus. - Tessalon  hurt her stomach. - Mucinex  has not provided relief. - Current respiratory medications include albuterol  solution and inhaler, Trelegy.   OBJECTIVE:  BP (!) 180/62 Comment: repeat bp per Dr. Amalia request  Pulse 61   Wt 135 lb 12.8 oz (61.6 kg)   SpO2 98%   BMI 24.84 kg/m   Physical Exam GENERAL: Alert, cooperative, well developed, no acute distress CHEST: Clear to auscultation bilaterally, no wheezes, rhonchi, or crackles CARDIOVASCULAR: Normal heart rate and rhythm, S1 and S2 normal without murmurs NEUROLOGICAL: Cranial nerves grossly intact, moves all extremities without gross motor or sensory deficit  ASSESSMENT/PLAN:   Assessment &  Plan Generalized pruritus Chronic cough Persistent itching despite Zyrtec  and Pepcid  BID dosing. Normal renal function. Differential includes thyroid  dysfunction, erythrocytosis, hepatic/biliary dysfunction, malignancy. - Ordered CBC and liver function tests. - Ordered chest x-ray to rule out nodules or other causes, also could be contributing to her chronic cough. - Continue Zyrtec  and Pepcid  twice daily for now; she is aware of possible sedating effects of this and has been tolerating well. - Prescribed codeine  cough syrup, 5 mL every 8 hours as needed for cough; counseled on sedating side effects. Essential hypertension Chronic heart failure with preserved ejection fraction (HFpEF) (HCC) Blood pressure elevated at SBP 175-180 mmHg, likely stress-related while being in office though has been persistent over multiple visits. Discussed adding eplerenone  to manage blood pressure and support heart function. Eplerenone  may increase potassium levels, requiring monitoring. - Prescribed eplerenone  at a low dose 25 mg daily, appreciate Dr. Rennis help. - Schedule lab visit in one week to check potassium levels. Depression, recurrent Mood swings, previously on bupropion . Discussed restarting bupropion  due to current stress and mood issues. - Prescribed bupropion  150 mg.  Stuart Redo, MD [facility]  "

## 2024-08-21 NOTE — Assessment & Plan Note (Signed)
 Hypertension longstanding currently uncontrolled. Blood pressure goal of <130/80 mmHg. Medication adherence good.  - Continued losartan  50 mg. - Continued carvedilol  6.25 mg BID - With Heart Failure - discussed option of starting eplerenone  25mg  once daily with PCP (Dr. Tharon, also seeing patient today).

## 2024-08-22 ENCOUNTER — Ambulatory Visit: Payer: Self-pay | Admitting: Family Medicine

## 2024-08-22 LAB — CBC WITH DIFFERENTIAL/PLATELET
Basophils Absolute: 0 x10E3/uL (ref 0.0–0.2)
Basos: 0 %
EOS (ABSOLUTE): 0.1 x10E3/uL (ref 0.0–0.4)
Eos: 3 %
Hematocrit: 44.9 % (ref 34.0–46.6)
Hemoglobin: 14.2 g/dL (ref 11.1–15.9)
Immature Grans (Abs): 0 x10E3/uL (ref 0.0–0.1)
Immature Granulocytes: 0 %
Lymphocytes Absolute: 2 x10E3/uL (ref 0.7–3.1)
Lymphs: 44 %
MCH: 27.2 pg (ref 26.6–33.0)
MCHC: 31.6 g/dL (ref 31.5–35.7)
MCV: 86 fL (ref 79–97)
Monocytes Absolute: 0.4 x10E3/uL (ref 0.1–0.9)
Monocytes: 8 %
Neutrophils Absolute: 2.1 x10E3/uL (ref 1.4–7.0)
Neutrophils: 45 %
Platelets: 181 x10E3/uL (ref 150–450)
RBC: 5.22 x10E6/uL (ref 3.77–5.28)
RDW: 12.7 % (ref 11.7–15.4)
WBC: 4.6 x10E3/uL (ref 3.4–10.8)

## 2024-08-22 LAB — HEPATIC FUNCTION PANEL
ALT: 18 IU/L (ref 0–32)
AST: 20 IU/L (ref 0–40)
Albumin: 4.4 g/dL (ref 3.8–4.8)
Alkaline Phosphatase: 64 IU/L (ref 49–135)
Bilirubin Total: 0.5 mg/dL (ref 0.0–1.2)
Bilirubin, Direct: 0.17 mg/dL (ref 0.00–0.40)
Total Protein: 7 g/dL (ref 6.0–8.5)

## 2024-08-22 NOTE — Progress Notes (Signed)
 Reviewed and agree with Dr Rennis plan.

## 2024-08-28 ENCOUNTER — Encounter

## 2024-09-02 ENCOUNTER — Encounter

## 2024-09-04 ENCOUNTER — Ambulatory Visit

## 2024-09-04 VITALS — Ht 62.0 in | Wt 132.0 lb

## 2024-09-04 DIAGNOSIS — Z0000 Encounter for general adult medical examination without abnormal findings: Secondary | ICD-10-CM

## 2024-09-04 NOTE — Patient Instructions (Signed)
 Mercedes Barr,  Thank you for taking the time for your Medicare Wellness Visit. I appreciate your continued commitment to your health goals. Please review the care plan we discussed, and feel free to reach out if I can assist you further.  Please note that Annual Wellness Visits do not include a physical exam. Some assessments may be limited, especially if the visit was conducted virtually. If needed, we may recommend an in-person follow-up with your provider.  Ongoing Care Seeing your primary care provider every 3 to 6 months helps us  monitor your health and provide consistent, personalized care.   Referrals If a referral was made during today's visit and you haven't received any updates within two weeks, please contact the referred provider directly to check on the status.  Recommended Screenings:  Health Maintenance  Topic Date Due   DTaP/Tdap/Td vaccine (2 - Tdap) 10/07/2024*   Zoster (Shingles) Vaccine (1 of 2) 10/29/2024*   Kidney health urinalysis for diabetes  10/24/2024   Hemoglobin A1C  12/30/2024   COVID-19 Vaccine (7 - 2025-26 season) 12/30/2024   Complete foot exam   07/10/2025   Yearly kidney function blood test for diabetes  07/31/2025   Eye exam for diabetics  08/20/2025   Medicare Annual Wellness Visit  09/04/2025   Colon Cancer Screening  03/02/2027   Pneumococcal Vaccine for age over 60  Completed   Flu Shot  Completed   Osteoporosis screening with Bone Density Scan  Completed   Hepatitis C Screening  Completed   Meningitis B Vaccine  Aged Out   Breast Cancer Screening  Discontinued  *Topic was postponed. The date shown is not the original due date.       09/04/2024   10:34 AM  Advanced Directives  Does Patient Have a Medical Advance Directive? No  Would patient like information on creating a medical advance directive? No - Patient declined    Vision: Annual vision screenings are recommended for early detection of glaucoma, cataracts, and diabetic  retinopathy. These exams can also reveal signs of chronic conditions such as diabetes and high blood pressure.  Dental: Annual dental screenings help detect early signs of oral cancer, gum disease, and other conditions linked to overall health, including heart disease and diabetes.  Please see the attached documents for additional preventive care recommendations.

## 2024-09-04 NOTE — Progress Notes (Addendum)
 "  Chief Complaint  Patient presents with   Medicare Wellness    SUBSEQUENT     Subjective:   Mercedes Barr is a 79 y.o. female who presents for a Medicare Annual Wellness Visit.  Visit info / Clinical Intake: Medicare Wellness Visit Type:: Subsequent Annual Wellness Visit Persons participating in visit and providing information:: patient Medicare Wellness Visit Mode:: Telephone If telephone:: video declined Since this visit was completed virtually, some vitals may be partially provided or unavailable. Missing vitals are due to the limitations of the virtual format.: Documented vitals are patient reported If Telephone or Video please confirm:: I connected with patient using audio/video enable telemedicine. I verified patient identity with two identifiers, discussed telehealth limitations, and patient agreed to proceed. Patient Location:: HOME Provider Location:: HOME OFFICE Interpreter Needed?: No Pre-visit prep was completed: yes AWV questionnaire completed by patient prior to visit?: yes Date:: 09/04/24 Living arrangements:: with family/others Patient's Overall Health Status Rating: (!) fair Typical amount of pain: some (Rt Knee and Leg. Followed by medical attention) Does pain affect daily life?: no Are you currently prescribed opioids?: no  Dietary Habits and Nutritional Risks How many meals a day?: 3 Eats fruit and vegetables daily?: yes Most meals are obtained by: preparing own meals (Daughter assist sometimes) In the last 2 weeks, have you had any of the following?: none Diabetic:: (!) yes Any non-healing wounds?: no How often do you check your BS?: continuous glucose monitor Would you like to be referred to a Nutritionist or for Diabetic Management? : no  Functional Status Activities of Daily Living (to include ambulation/medication): Independent Ambulation: Independent with device- listed below Home Assistive Devices/Equipment: Walker (specify Type);  Eyeglasses Medication Administration: Independent Home Management (perform basic housework or laundry): Independent Manage your own finances?: yes Primary transportation is: family / friends Concerns about vision?: no *vision screening is required for WTM* Concerns about hearing?: no  Fall Screening Falls in the past year?: 0 Number of falls in past year: 0 Was there an injury with Fall?: 0 Fall Risk Category Calculator: 0 Patient Fall Risk Level: Low Fall Risk  Fall Risk Patient at Risk for Falls Due to: Impaired balance/gait Fall risk Follow up: Falls evaluation completed; Education provided  Home and Transportation Safety: All rugs have non-skid backing?: N/A, no rugs All stairs or steps have railings?: yes Grab bars in the bathtub or shower?: yes Have non-skid surface in bathtub or shower?: yes Good home lighting?: yes Regular seat belt use?: yes Hospital stays in the last year:: no How many hospital stays:: 5 Reason: Gallbladder surgery  Cognitive Assessment Difficulty concentrating, remembering, or making decisions? : no Will 6CIT or Mini Cog be Completed: yes 6CIT or Mini Cog Declined: patient alert, oriented, able to answer questions appropriately and recall recent events What year is it?: 0 points What month is it?: 0 points Give patient an address phrase to remember (5 components): [address] [place], [place] About what time is it?: 0 points Count backwards from 20 to 1: 0 points Say the months of the year in reverse: 0 points Repeat the address phrase from earlier: 0 points 6 CIT Score: 0 points  Advance Directives (For Healthcare) Does Patient Have a Medical Advance Directive?: No Would patient like information on creating a medical advance directive?: No - Patient declined (PENDING)  Reviewed/Updated  Reviewed/Updated: Reviewed All (Medical, Surgical, Family, Medications, Allergies, Care Teams, Patient Goals)    Allergies (verified) Chlorhexidine ,  Naproxen , Oxycodone , Pregabalin , Statins, Amitriptyline hcl, Benazepril hcl,  Gabapentin , Oxycodone -acetaminophen , and Propoxyphene   Current Medications (verified) Outpatient Encounter Medications as of 09/04/2024  Medication Sig   acetaminophen  (TYLENOL ) 325 MG tablet Take 2 tablets (650 mg total) by mouth every 6 (six) hours as needed.   albuterol  (PROVENTIL ) (2.5 MG/3ML) 0.083% nebulizer solution TAKE 3 ML (2.5 MG TOTAL) BY NEBULIZATION EVERY 4 HOURS AS NEEDED FOR WHEEZING OR SHORTNESS OF BREATH   albuterol  (VENTOLIN  HFA) 108 (90 Base) MCG/ACT inhaler INHALE 2 PUFFS EVERY 4 HOURS AS NEEDED FOR WHEEZING   Baclofen  5 MG TABS TAKE 1 TABLET (5 MG TOTAL) BY MOUTH DAILY AS NEEDED (MUSCLE SPASMS).   BD PEN NEEDLE NANO 2ND GEN 32G X 4 MM MISC USE AS DIRECTED WITH INSULIN  4 TIMES DAILY   buPROPion  (WELLBUTRIN  XL) 150 MG 24 hr tablet Take 1 tablet (150 mg total) by mouth daily.   carvedilol  (COREG ) 6.25 MG tablet Take 1 tablet (6.25 mg total) by mouth 2 (two) times daily with a meal.   cetirizine  (ZYRTEC  ALLERGY) 10 MG tablet Take 1 tablet (10 mg total) by mouth 2 (two) times daily.   citalopram  (CELEXA ) 20 MG tablet Take 1 tablet (20 mg total) by mouth daily.   Continuous Glucose Sensor (FREESTYLE LIBRE 3 PLUS SENSOR) MISC Change sensor every 15 days.   diclofenac  Sodium (VOLTAREN ) 1 % GEL APPLY 2 GRAMS TO AFFECTED AREA 4 TIMES A DAY   empagliflozin  (JARDIANCE ) 25 MG TABS tablet Take 1 tablet (25 mg total) by mouth daily.   EPINEPHrine  (EPIPEN  2-PAK) 0.3 mg/0.3 mL IJ SOAJ injection Inject 0.3 mg into the muscle as needed for anaphylaxis.   eplerenone  (INSPRA ) 25 MG tablet Take 1 tablet (25 mg total) by mouth daily.   famotidine  (PEPCID ) 20 MG tablet TAKE 1 TABLET BY MOUTH TWICE A DAY   furosemide  (LASIX ) 20 MG tablet TAKE 2 TABLETS BY MOUTH DAILY AS NEEDED.   glucose blood (ONE TOUCH ULTRA TEST) test strip Use for blood sugar testing 1-3 times daily   guaiFENesin -codeine  100-10 MG/5ML syrup Take 5 mLs  by mouth every 8 (eight) hours as needed for cough.   insulin  glargine (LANTUS  SOLOSTAR) 100 UNIT/ML Solostar Pen Inject 10-20 Units into the skin daily.   insulin  lispro (HUMALOG  KWIKPEN) 100 UNIT/ML KwikPen Inject 6-12 Units into the skin 2 (two) times daily before a meal. 12-15 units with lunch and 12 units at dinner.   Lancets MISC Use with blood sugar monitor to check daily   levothyroxine  (SYNTHROID ) 88 MCG tablet Take 1 tablet (88 mcg total) by mouth every morning. 30 minutes before food   losartan  (COZAAR ) 50 MG tablet Take 1 tablet (50 mg total) by mouth daily.   omeprazole  (PRILOSEC) 20 MG capsule TAKE 1 CAPSULE BY MOUTH  TWICE DAILY   ondansetron  (ZOFRAN ) 4 MG tablet Take 1 tablet (4 mg total) by mouth every 8 (eight) hours as needed for nausea or vomiting.   polyethylene glycol powder (GLYCOLAX /MIRALAX ) powder TAKE 17 GRAMS BY MOUTH 2  TIMES DAILY AS NEEDED FOR  LAXATIVE   rivaroxaban  (XARELTO ) 20 MG TABS tablet Take 1 tablet (20 mg total) by mouth daily with supper.   rosuvastatin  (CRESTOR ) 20 MG tablet TAKE 1 TABLET BY MOUTH EVERY DAY   Spacer/Aero-Holding Chambers (AEROCHAMBER PLUS) inhaler Use as instructed   tirzepatide  (MOUNJARO ) 7.5 MG/0.5ML Pen Inject 7.5 mg into the skin once a week.   Travoprost , BAK Free, (TRAVATAN ) 0.004 % SOLN ophthalmic solution Place 1 drop into both eyes at bedtime.   TRELEGY ELLIPTA   100-62.5-25 MCG/ACT AEPB INHALE 1 PUFF BY MOUTH EVERY DAY   No facility-administered encounter medications on file as of 09/04/2024.    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 CROITORU - [facility].  OFFICE NOTE DR. A. LITTLE STATES  EXTREMELY PACEMAKER SENSITIVE AND WHEN YOU TRY TO CHECK FOR UNDERLYING RHYTHMS SHE WILL HAVE SNYCOPE AND WE HAVE NOT DONE THIS NOW IN ABOUT 2 YEARS.   Shortness of breath dyspnea    walking distance or climbing stairs   Skin lesion 12/07/2022   Tremor of left hand 06/12/2015   Weight loss, unintentional 12/21/2012   04/03/13 - [facility] (see scanned report) upper GI endoscopy showed normal esophagus, stomach, and duodenum; recommend regular diet, continuing current meds, and gastric emptying study     Past Surgical History:  Procedure Laterality Date   ATRIOVENTRICULAR CUSHION DEFECT REPAIR  1988   CARDIAC CATHETERIZATION  2011    SHOWED NO CAD-PER CARDIOLOGY OFFICE NOTES DR. A. LITTLE   CHOLECYSTECTOMY N/A 11/19/2022   Procedure: LAPAROSCOPIC CHOLECYSTECTOMY;  Surgeon: Teresa Lonni HERO, MD;  Location: [facility] OR;  Service: General;  Laterality: N/A;   colonscopy      removed polyps    INSERT / REPLACE / REMOVE PACEMAKER     LAPAROSCOPIC LYSIS OF ADHESIONS  11/19/2022   Procedure: LAPAROSCOPIC LYSIS OF ADHESIONS;  Surgeon: Teresa Lonni HERO, MD;  Location: [facility] OR;  Service: General;;   LEFT HEART CATH AND CORONARY ANGIOGRAPHY N/A 11/20/2022   Procedure: LEFT HEART CATH AND CORONARY ANGIOGRAPHY;  Surgeon: Wendel Lurena POUR, MD;  Location: [facility]  INVASIVE CV LAB;  Service: Cardiovascular;  Laterality: N/A;   PACEMAKER INSERTION  1988   last gen 11/08- MDT   PPM GENERATOR CHANGEOUT N/A 02/15/2017   Procedure: PPM Generator Changeout;  Surgeon: Francyne Headland, MD;  Location: [facility] INVASIVE CV LAB;  Service: Cardiovascular;  Laterality: N/A;   TOTAL KNEE ARTHROPLASTY Right 11/19/2012   Procedure: RIGHT TOTAL KNEE ARTHROPLASTY;  Surgeon: Dempsey LULLA Moan, MD;  Location: [facility] ORS;  Service: Orthopedics;  Laterality: Right;   TOTAL KNEE REVISION Right 09/24/2014   Procedure: RIGHT TOTAL KNEE ARTHROPLASTY REVISION;  Surgeon: Dempsey Moan LULLA, MD;  Location: [facility] ORS;  Service: Orthopedics;  Laterality: Right;   TUBAL LIGATION     Family History  Problem Relation Age of Onset   Heart failure Mother    Hypertension Mother    Stroke Father  Heart failure Sister    Kidney disease Daughter    Cancer Brother    Breast cancer Neg Hx    Social History   Occupational History   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   Tobacco Use   Smoking status: Never    Passive exposure: Never   Smokeless tobacco: Never  Vaping Use   Vaping status: Never Used  Substance and Sexual Activity   Alcohol use: No   Drug use: No   Sexual activity: Not Currently   Tobacco Counseling Counseling given: Not Answered  SDOH Screenings   Food Insecurity: No Food Insecurity (09/04/2024)  Housing: Low Risk (09/04/2024)  Transportation Needs: No Transportation Needs (09/04/2024)  Utilities: Not At Risk (09/04/2024)  Alcohol Screen: Low Risk (09/04/2024)  Depression (PHQ2-9): Low Risk (09/04/2024)  Financial Resource Strain: Low Risk (09/04/2024)  Physical Activity: Insufficiently Active (09/04/2024)  Social Connections: Moderately Integrated (09/04/2024)  Stress: No Stress Concern Present (09/04/2024)  Tobacco Use: Low Risk (09/04/2024)  Health Literacy: Adequate Health Literacy (09/04/2024)   See flowsheets for full screening details  Depression Screen PHQ  2 & 9 Depression Scale- Over the past 2 weeks, how often have you been bothered by any of the following problems? Little interest or pleasure in doing things: 0 Feeling down, depressed, or hopeless (PHQ Adolescent also includes...irritable): 0 PHQ-2 Total Score: 0 Trouble falling or staying asleep, or sleeping too much: 1 Feeling tired or having little energy: 1 Poor appetite or overeating (PHQ Adolescent also includes...weight loss): 0 Feeling bad about yourself - or that you are a failure or have let yourself or your family down: 0 Trouble concentrating on things, such as reading the newspaper or watching television (PHQ Adolescent also includes...like school work): 0 Moving or speaking so slowly that other people could have noticed. Or the opposite - being so fidgety or restless that you have been moving around a lot more than usual: 0 Thoughts that you would be better off dead, or of hurting yourself in some way: 0 PHQ-9 Total Score: 2 If you checked off any problems, how difficult have these problems made it for you to do your work, take care of things at home, or get along with other people?: Not difficult at all  Depression Treatment Depression Interventions/Treatment : [id number] Score <4 Follow-up Not Indicated     Goals Addressed             This Visit's Progress    09/04/2024: I will continue to ask God to give me enough strength to stay alive so that I can see my grandchildren grow up.               Objective:    Today's Vitals   09/04/24 1033  Weight: 132 lb (59.9 kg)  Height: 5' 2 (1.575 m)  PainSc: 8   PainLoc: Knee   Body mass index is 24.14 kg/m.  Hearing/Vision screen Hearing Screening - Comments:: Denies hearing difficulties.  Vision Screening - Comments:: Wears rx glasses - up to date with routine eye exams with Medford Gaudy.  Patient has cataracts, scheduled for surgery May 2026.  Immunizations and Health Maintenance Health Maintenance  Topic Date  Due   DTaP/Tdap/Td (2 - Tdap) 10/07/2024 (Originally 01/07/2012)   Zoster Vaccines- Shingrix (1 of 2) 10/29/2024 (Originally 04/10/1997)   Diabetic kidney evaluation - Urine ACR  10/24/2024   HEMOGLOBIN A1C  12/30/2024   COVID-19 Vaccine (7 - 2025-26 season) 12/30/2024   FOOT EXAM  07/10/2025  Diabetic kidney evaluation - eGFR measurement  07/31/2025   OPHTHALMOLOGY EXAM  08/20/2025   Medicare Annual Wellness (AWV)  09/04/2025   Colonoscopy  03/02/2027   Pneumococcal Vaccine: 50+ Years  Completed   Influenza Vaccine  Completed   Bone Density Scan  Completed   Hepatitis C Screening  Completed   Meningococcal B Vaccine  Aged Out   Mammogram  Discontinued        Assessment/Plan:  This is a routine wellness examination for Mercedes Barr.  Patient Care Team: Tharon Lung, MD as PCP - General (Family Medicine) Croitoru, Jerel, MD as PCP - Cardiology (Cardiology) Croitoru, Jerel, MD as PCP - Electrophysiology (Cardiology) Levora Blackbird, DMD (Dentistry) Croitoru, Jerel, MD as Consulting Physician (Cardiology) Octavia Bruckner, MD as Consulting Physician (Ophthalmology)  I have personally reviewed and noted the following in the patients chart:   Medical and social history Use of alcohol, tobacco or illicit drugs  Current medications and supplements including opioid prescriptions. Functional ability and status Nutritional status Physical activity Advanced directives List of other physicians Hospitalizations, surgeries, and ER visits in previous 12 months Vitals Screenings to include cognitive, depression, and falls Referrals and appointments  No orders of the defined types were placed in this encounter.  In addition, I have reviewed and discussed with patient certain preventive protocols, quality metrics, and best practice recommendations. A written personalized care plan for preventive services as well as general preventive health recommendations were provided to patient.   Mercedes LOISE Fuller, LPN   [id number]/71/7973   Return in about 1 year (around 09/04/2025) for Medicare wellness.  After Visit Summary: (MyChart) Due to this being a telephonic visit, the after visit summary with patients personalized plan was offered to patient via MyChart   Nurse Notes: None at this time. "

## 2024-09-12 ENCOUNTER — Other Ambulatory Visit: Payer: Self-pay | Admitting: Family Medicine

## 2024-09-12 NOTE — Telephone Encounter (Signed)
 Will send in script for eplerenone .  Admin team: can we get her scheduled for a visit soon to recheck blood pressure and labs with the new medication we started, eplerenone ? Thank you!

## 2024-10-01 ENCOUNTER — Ambulatory Visit: Payer: Self-pay | Admitting: Family Medicine

## 2024-10-09 ENCOUNTER — Ambulatory Visit: Admitting: Podiatry

## 2024-11-27 ENCOUNTER — Encounter

## 2025-02-26 ENCOUNTER — Encounter

## 2025-05-28 ENCOUNTER — Encounter

## 2025-08-27 ENCOUNTER — Encounter

## 2025-11-26 ENCOUNTER — Encounter
